# Patient Record
Sex: Male | Born: 1935 | Race: White | Hispanic: No | Marital: Married | Smoking: Never smoker
Health system: Southern US, Community
[De-identification: ages and names within clinical notes are randomized; demographics above are authoritative.]

## PROBLEM LIST (undated history)

## (undated) DIAGNOSIS — D539 Nutritional anemia, unspecified: Secondary | ICD-10-CM

## (undated) DIAGNOSIS — R296 Repeated falls: Secondary | ICD-10-CM

## (undated) DIAGNOSIS — S12600A Unspecified displaced fracture of seventh cervical vertebra, initial encounter for closed fracture: Secondary | ICD-10-CM

## (undated) DIAGNOSIS — N183 Chronic kidney disease, stage 3 unspecified: Secondary | ICD-10-CM

## (undated) DIAGNOSIS — F32A Depression, unspecified: Secondary | ICD-10-CM

## (undated) DIAGNOSIS — Z87448 Personal history of other diseases of urinary system: Secondary | ICD-10-CM

## (undated) DIAGNOSIS — Z5189 Encounter for other specified aftercare: Secondary | ICD-10-CM

## (undated) DIAGNOSIS — M1990 Unspecified osteoarthritis, unspecified site: Secondary | ICD-10-CM

## (undated) DIAGNOSIS — R42 Dizziness and giddiness: Secondary | ICD-10-CM

## (undated) DIAGNOSIS — N453 Epididymo-orchitis: Secondary | ICD-10-CM

## (undated) DIAGNOSIS — F329 Major depressive disorder, single episode, unspecified: Secondary | ICD-10-CM

## (undated) DIAGNOSIS — R7303 Prediabetes: Secondary | ICD-10-CM

## (undated) DIAGNOSIS — G4730 Sleep apnea, unspecified: Secondary | ICD-10-CM

## (undated) DIAGNOSIS — M4810 Ankylosing hyperostosis [Forestier], site unspecified: Secondary | ICD-10-CM

## (undated) DIAGNOSIS — Z86711 Personal history of pulmonary embolism: Secondary | ICD-10-CM

## (undated) DIAGNOSIS — I2510 Atherosclerotic heart disease of native coronary artery without angina pectoris: Secondary | ICD-10-CM

## (undated) DIAGNOSIS — R1310 Dysphagia, unspecified: Secondary | ICD-10-CM

## (undated) DIAGNOSIS — Z8781 Personal history of (healed) traumatic fracture: Secondary | ICD-10-CM

## (undated) DIAGNOSIS — F0390 Unspecified dementia without behavioral disturbance: Secondary | ICD-10-CM

## (undated) DIAGNOSIS — IMO0002 Reserved for concepts with insufficient information to code with codable children: Secondary | ICD-10-CM

## (undated) DIAGNOSIS — K222 Esophageal obstruction: Secondary | ICD-10-CM

## (undated) DIAGNOSIS — K219 Gastro-esophageal reflux disease without esophagitis: Secondary | ICD-10-CM

## (undated) DIAGNOSIS — W19XXXA Unspecified fall, initial encounter: Secondary | ICD-10-CM

## (undated) DIAGNOSIS — I639 Cerebral infarction, unspecified: Secondary | ICD-10-CM

## (undated) DIAGNOSIS — B3741 Candidal cystitis and urethritis: Secondary | ICD-10-CM

## (undated) DIAGNOSIS — H9190 Unspecified hearing loss, unspecified ear: Secondary | ICD-10-CM

## (undated) DIAGNOSIS — J189 Pneumonia, unspecified organism: Secondary | ICD-10-CM

## (undated) DIAGNOSIS — B009 Herpesviral infection, unspecified: Secondary | ICD-10-CM

## (undated) DIAGNOSIS — N3941 Urge incontinence: Secondary | ICD-10-CM

## (undated) DIAGNOSIS — Z8672 Personal history of thrombophlebitis: Secondary | ICD-10-CM

## (undated) DIAGNOSIS — I351 Nonrheumatic aortic (valve) insufficiency: Secondary | ICD-10-CM

## (undated) DIAGNOSIS — G250 Essential tremor: Secondary | ICD-10-CM

## (undated) DIAGNOSIS — R972 Elevated prostate specific antigen [PSA]: Secondary | ICD-10-CM

## (undated) DIAGNOSIS — E785 Hyperlipidemia, unspecified: Secondary | ICD-10-CM

## (undated) DIAGNOSIS — R269 Unspecified abnormalities of gait and mobility: Secondary | ICD-10-CM

## (undated) DIAGNOSIS — I82409 Acute embolism and thrombosis of unspecified deep veins of unspecified lower extremity: Secondary | ICD-10-CM

## (undated) DIAGNOSIS — M810 Age-related osteoporosis without current pathological fracture: Principal | ICD-10-CM

## (undated) DIAGNOSIS — K4090 Unilateral inguinal hernia, without obstruction or gangrene, not specified as recurrent: Secondary | ICD-10-CM

## (undated) HISTORY — DX: Prediabetes: R73.03

## (undated) HISTORY — DX: Essential tremor: G25.0

## (undated) HISTORY — DX: Hyperlipidemia, unspecified: E78.5

## (undated) HISTORY — DX: Major depressive disorder, single episode, unspecified: F32.9

## (undated) HISTORY — DX: Epididymo-orchitis: N45.3

## (undated) HISTORY — DX: Reserved for concepts with insufficient information to code with codable children: IMO0002

## (undated) HISTORY — DX: Unspecified osteoarthritis, unspecified site: M19.90

## (undated) HISTORY — DX: Ankylosing hyperostosis (forestier), site unspecified: M48.10

## (undated) HISTORY — DX: Personal history of (healed) traumatic fracture: Z87.81

## (undated) HISTORY — DX: Cerebral infarction, unspecified: I63.9

## (undated) HISTORY — DX: Unilateral inguinal hernia, without obstruction or gangrene, not specified as recurrent: K40.90

## (undated) HISTORY — DX: Depression, unspecified: F32.A

## (undated) HISTORY — DX: Atherosclerotic heart disease of native coronary artery without angina pectoris: I25.10

## (undated) HISTORY — DX: Candidal cystitis and urethritis: B37.41

## (undated) HISTORY — DX: Personal history of thrombophlebitis: Z86.72

## (undated) HISTORY — DX: Encounter for other specified aftercare: Z51.89

## (undated) HISTORY — DX: Chronic kidney disease, stage 3 (moderate): N18.3

## (undated) HISTORY — DX: Dizziness and giddiness: R42

## (undated) HISTORY — DX: Nutritional anemia, unspecified: D53.9

## (undated) HISTORY — DX: Unspecified abnormalities of gait and mobility: R26.9

## (undated) HISTORY — DX: Personal history of other diseases of urinary system: Z87.448

## (undated) HISTORY — DX: Personal history of pulmonary embolism: Z86.711

## (undated) HISTORY — DX: Sleep apnea, unspecified: G47.30

## (undated) HISTORY — DX: Elevated prostate specific antigen (PSA): R97.20

## (undated) HISTORY — DX: Unspecified hearing loss, unspecified ear: H91.90

## (undated) HISTORY — DX: Herpesviral infection, unspecified: B00.9

## (undated) HISTORY — DX: Age-related osteoporosis without current pathological fracture: M81.0

## (undated) HISTORY — PX: HIP PINNING: SHX1757

## (undated) HISTORY — DX: Unspecified displaced fracture of seventh cervical vertebra, initial encounter for closed fracture: S12.600A

## (undated) HISTORY — DX: Urge incontinence: N39.41

## (undated) HISTORY — DX: Chronic kidney disease, stage 3 unspecified: N18.30

## (undated) HISTORY — DX: Acute embolism and thrombosis of unspecified deep veins of unspecified lower extremity: I82.409

## (undated) HISTORY — DX: Gastro-esophageal reflux disease without esophagitis: K21.9

## (undated) HISTORY — DX: Esophageal obstruction: K22.2

---

## 1963-09-09 HISTORY — PX: TONSILLECTOMY: SUR1361

## 1977-09-08 HISTORY — PX: HERNIA REPAIR: SHX51

## 1989-05-09 DIAGNOSIS — IMO0001 Reserved for inherently not codable concepts without codable children: Secondary | ICD-10-CM

## 1989-05-09 DIAGNOSIS — Z5189 Encounter for other specified aftercare: Secondary | ICD-10-CM

## 1989-05-09 HISTORY — DX: Encounter for other specified aftercare: Z51.89

## 1989-05-09 HISTORY — DX: Reserved for inherently not codable concepts without codable children: IMO0001

## 1990-09-08 DIAGNOSIS — Z86711 Personal history of pulmonary embolism: Secondary | ICD-10-CM

## 1990-09-08 HISTORY — PX: CYSTOSCOPY: SUR368

## 1991-08-09 DIAGNOSIS — Z86711 Personal history of pulmonary embolism: Secondary | ICD-10-CM

## 1991-08-09 HISTORY — DX: Personal history of pulmonary embolism: Z86.711

## 1994-09-08 HISTORY — PX: EXPLORATORY LAPAROTOMY: SUR591

## 1994-09-08 HISTORY — PX: APPENDECTOMY: SHX54

## 1996-09-08 HISTORY — PX: COLONOSCOPY: SHX174

## 1998-09-08 HISTORY — PX: CARDIAC CATHETERIZATION: SHX172

## 1999-03-28 ENCOUNTER — Encounter (HOSPITAL_COMMUNITY): Admission: RE | Admit: 1999-03-28 | Discharge: 1999-04-08 | Payer: Self-pay | Admitting: Internal Medicine

## 1999-04-09 ENCOUNTER — Encounter (HOSPITAL_COMMUNITY): Admission: RE | Admit: 1999-04-09 | Discharge: 1999-07-08 | Payer: Self-pay | Admitting: Internal Medicine

## 1999-06-09 HISTORY — PX: OTHER SURGICAL HISTORY: SHX169

## 1999-06-20 ENCOUNTER — Encounter: Payer: Self-pay | Admitting: Emergency Medicine

## 1999-06-20 ENCOUNTER — Emergency Department (HOSPITAL_COMMUNITY): Admission: EM | Admit: 1999-06-20 | Discharge: 1999-06-20 | Payer: Self-pay | Admitting: Emergency Medicine

## 1999-07-01 ENCOUNTER — Ambulatory Visit (HOSPITAL_COMMUNITY): Admission: RE | Admit: 1999-07-01 | Discharge: 1999-07-01 | Payer: Self-pay | Admitting: Cardiology

## 1999-07-02 ENCOUNTER — Ambulatory Visit (HOSPITAL_COMMUNITY): Admission: RE | Admit: 1999-07-02 | Discharge: 1999-07-02 | Payer: Self-pay | Admitting: Cardiology

## 1999-07-09 ENCOUNTER — Encounter (HOSPITAL_COMMUNITY): Admission: RE | Admit: 1999-07-09 | Discharge: 1999-10-07 | Payer: Self-pay | Admitting: Internal Medicine

## 1999-08-19 ENCOUNTER — Ambulatory Visit (HOSPITAL_COMMUNITY): Admission: RE | Admit: 1999-08-19 | Discharge: 1999-08-19 | Payer: Self-pay | Admitting: *Deleted

## 1999-10-08 ENCOUNTER — Encounter (HOSPITAL_COMMUNITY): Admission: RE | Admit: 1999-10-08 | Discharge: 1999-11-21 | Payer: Self-pay | Admitting: Internal Medicine

## 2000-05-13 ENCOUNTER — Encounter (INDEPENDENT_AMBULATORY_CARE_PROVIDER_SITE_OTHER): Payer: Self-pay | Admitting: *Deleted

## 2000-05-13 ENCOUNTER — Ambulatory Visit (HOSPITAL_COMMUNITY): Admission: RE | Admit: 2000-05-13 | Discharge: 2000-05-13 | Payer: Self-pay | Admitting: *Deleted

## 2000-06-02 ENCOUNTER — Encounter (INDEPENDENT_AMBULATORY_CARE_PROVIDER_SITE_OTHER): Payer: Self-pay | Admitting: *Deleted

## 2000-06-22 ENCOUNTER — Encounter (INDEPENDENT_AMBULATORY_CARE_PROVIDER_SITE_OTHER): Payer: Self-pay | Admitting: *Deleted

## 2000-11-06 HISTORY — PX: CT ABD W & PELVIS WO CM: HXRAD294

## 2001-07-09 HISTORY — PX: CT ABD W & PELVIS WO CM: HXRAD294

## 2001-08-08 ENCOUNTER — Encounter: Payer: Self-pay | Admitting: Family Medicine

## 2001-08-08 LAB — CONVERTED CEMR LAB: PSA: 0.9 ng/mL

## 2002-03-24 HISTORY — PX: KNEE ARTHROSCOPY: SUR90

## 2002-07-09 ENCOUNTER — Encounter: Payer: Self-pay | Admitting: Family Medicine

## 2002-07-09 LAB — CONVERTED CEMR LAB: PSA: 1.1 ng/mL

## 2002-08-03 HISTORY — PX: ORIF FEMORAL NECK FRACTURE W/ DHS: SUR930

## 2003-12-08 ENCOUNTER — Encounter: Payer: Self-pay | Admitting: Family Medicine

## 2003-12-08 LAB — CONVERTED CEMR LAB: PSA: 1.1 ng/mL

## 2004-08-02 ENCOUNTER — Ambulatory Visit: Payer: Self-pay | Admitting: Family Medicine

## 2004-08-08 ENCOUNTER — Ambulatory Visit: Payer: Self-pay | Admitting: Family Medicine

## 2004-08-12 ENCOUNTER — Ambulatory Visit: Payer: Self-pay | Admitting: Oncology

## 2004-08-16 ENCOUNTER — Ambulatory Visit: Payer: Self-pay | Admitting: Family Medicine

## 2004-09-05 ENCOUNTER — Ambulatory Visit: Payer: Self-pay | Admitting: Family Medicine

## 2004-09-12 ENCOUNTER — Ambulatory Visit: Payer: Self-pay | Admitting: Family Medicine

## 2004-09-13 ENCOUNTER — Ambulatory Visit: Payer: Self-pay | Admitting: Family Medicine

## 2004-10-11 ENCOUNTER — Ambulatory Visit: Payer: Self-pay | Admitting: Family Medicine

## 2004-11-08 ENCOUNTER — Ambulatory Visit: Payer: Self-pay | Admitting: Family Medicine

## 2004-11-22 ENCOUNTER — Ambulatory Visit: Payer: Self-pay | Admitting: Internal Medicine

## 2004-11-22 ENCOUNTER — Ambulatory Visit: Payer: Self-pay | Admitting: Family Medicine

## 2004-12-02 ENCOUNTER — Ambulatory Visit: Payer: Self-pay | Admitting: Internal Medicine

## 2004-12-16 ENCOUNTER — Ambulatory Visit: Payer: Self-pay | Admitting: Family Medicine

## 2005-01-06 ENCOUNTER — Ambulatory Visit: Payer: Self-pay | Admitting: Family Medicine

## 2005-01-20 ENCOUNTER — Ambulatory Visit: Payer: Self-pay | Admitting: Family Medicine

## 2005-01-27 ENCOUNTER — Ambulatory Visit: Payer: Self-pay | Admitting: Family Medicine

## 2005-02-24 ENCOUNTER — Ambulatory Visit: Payer: Self-pay | Admitting: Family Medicine

## 2005-03-21 ENCOUNTER — Ambulatory Visit: Payer: Self-pay | Admitting: Family Medicine

## 2005-03-24 ENCOUNTER — Ambulatory Visit: Payer: Self-pay | Admitting: Family Medicine

## 2005-04-21 ENCOUNTER — Ambulatory Visit: Payer: Self-pay | Admitting: Family Medicine

## 2005-05-05 ENCOUNTER — Ambulatory Visit: Payer: Self-pay | Admitting: Family Medicine

## 2005-05-09 ENCOUNTER — Encounter: Payer: Self-pay | Admitting: Family Medicine

## 2005-05-19 ENCOUNTER — Ambulatory Visit: Payer: Self-pay | Admitting: Family Medicine

## 2005-05-22 ENCOUNTER — Ambulatory Visit: Payer: Self-pay | Admitting: Family Medicine

## 2005-05-26 ENCOUNTER — Ambulatory Visit: Payer: Self-pay | Admitting: Family Medicine

## 2005-05-28 ENCOUNTER — Ambulatory Visit: Payer: Self-pay | Admitting: Family Medicine

## 2005-06-16 ENCOUNTER — Ambulatory Visit: Payer: Self-pay | Admitting: Family Medicine

## 2005-06-30 ENCOUNTER — Ambulatory Visit: Payer: Self-pay | Admitting: Family Medicine

## 2005-07-10 ENCOUNTER — Ambulatory Visit: Payer: Self-pay | Admitting: Family Medicine

## 2005-07-28 ENCOUNTER — Ambulatory Visit: Payer: Self-pay | Admitting: Family Medicine

## 2005-08-22 ENCOUNTER — Ambulatory Visit: Payer: Self-pay | Admitting: Family Medicine

## 2005-08-28 ENCOUNTER — Ambulatory Visit: Payer: Self-pay | Admitting: Family Medicine

## 2005-09-11 ENCOUNTER — Ambulatory Visit: Payer: Self-pay | Admitting: Family Medicine

## 2005-09-25 ENCOUNTER — Ambulatory Visit: Payer: Self-pay | Admitting: Family Medicine

## 2005-10-10 ENCOUNTER — Ambulatory Visit: Payer: Self-pay | Admitting: Family Medicine

## 2005-10-23 ENCOUNTER — Ambulatory Visit: Payer: Self-pay | Admitting: Family Medicine

## 2005-11-20 ENCOUNTER — Ambulatory Visit: Payer: Self-pay | Admitting: Family Medicine

## 2005-12-04 ENCOUNTER — Ambulatory Visit: Payer: Self-pay | Admitting: Family Medicine

## 2005-12-31 ENCOUNTER — Ambulatory Visit: Payer: Self-pay | Admitting: Family Medicine

## 2006-01-28 ENCOUNTER — Ambulatory Visit: Payer: Self-pay | Admitting: Family Medicine

## 2006-02-26 ENCOUNTER — Ambulatory Visit: Payer: Self-pay | Admitting: Family Medicine

## 2006-03-05 ENCOUNTER — Ambulatory Visit: Payer: Self-pay | Admitting: Family Medicine

## 2006-03-26 ENCOUNTER — Ambulatory Visit: Payer: Self-pay | Admitting: Family Medicine

## 2006-04-03 ENCOUNTER — Ambulatory Visit: Payer: Self-pay | Admitting: Family Medicine

## 2006-04-10 ENCOUNTER — Ambulatory Visit: Payer: Self-pay | Admitting: Family Medicine

## 2006-04-23 ENCOUNTER — Ambulatory Visit: Payer: Self-pay | Admitting: Family Medicine

## 2006-05-21 ENCOUNTER — Ambulatory Visit: Payer: Self-pay | Admitting: Family Medicine

## 2006-05-25 ENCOUNTER — Ambulatory Visit: Payer: Self-pay | Admitting: Family Medicine

## 2006-05-29 ENCOUNTER — Ambulatory Visit: Payer: Self-pay | Admitting: Family Medicine

## 2006-06-01 ENCOUNTER — Ambulatory Visit: Payer: Self-pay | Admitting: Family Medicine

## 2006-06-04 ENCOUNTER — Ambulatory Visit: Payer: Self-pay | Admitting: Family Medicine

## 2006-06-18 ENCOUNTER — Ambulatory Visit: Payer: Self-pay | Admitting: Family Medicine

## 2006-07-02 ENCOUNTER — Ambulatory Visit: Payer: Self-pay | Admitting: Family Medicine

## 2006-07-29 ENCOUNTER — Ambulatory Visit: Payer: Self-pay | Admitting: Family Medicine

## 2006-08-26 ENCOUNTER — Ambulatory Visit: Payer: Self-pay | Admitting: Family Medicine

## 2006-08-27 ENCOUNTER — Encounter: Payer: Self-pay | Admitting: Family Medicine

## 2006-08-27 LAB — CONVERTED CEMR LAB: PSA: 1.11 ng/mL

## 2006-09-09 ENCOUNTER — Ambulatory Visit: Payer: Self-pay | Admitting: Family Medicine

## 2006-09-11 ENCOUNTER — Ambulatory Visit: Payer: Self-pay | Admitting: Family Medicine

## 2006-10-12 ENCOUNTER — Ambulatory Visit: Payer: Self-pay | Admitting: Family Medicine

## 2006-10-26 ENCOUNTER — Ambulatory Visit: Payer: Self-pay | Admitting: Family Medicine

## 2006-11-09 ENCOUNTER — Ambulatory Visit: Payer: Self-pay | Admitting: Family Medicine

## 2006-12-07 ENCOUNTER — Ambulatory Visit: Payer: Self-pay | Admitting: Family Medicine

## 2007-01-04 ENCOUNTER — Ambulatory Visit: Payer: Self-pay | Admitting: Family Medicine

## 2007-01-04 DIAGNOSIS — I809 Phlebitis and thrombophlebitis of unspecified site: Secondary | ICD-10-CM

## 2007-02-02 ENCOUNTER — Ambulatory Visit: Payer: Self-pay | Admitting: Family Medicine

## 2007-02-02 ENCOUNTER — Telehealth (INDEPENDENT_AMBULATORY_CARE_PROVIDER_SITE_OTHER): Payer: Self-pay | Admitting: *Deleted

## 2007-02-02 LAB — CONVERTED CEMR LAB: INR: 2.9

## 2007-03-02 ENCOUNTER — Ambulatory Visit: Payer: Self-pay | Admitting: Family Medicine

## 2007-03-08 ENCOUNTER — Encounter: Payer: Self-pay | Admitting: Family Medicine

## 2007-03-08 DIAGNOSIS — D696 Thrombocytopenia, unspecified: Secondary | ICD-10-CM

## 2007-03-08 DIAGNOSIS — N183 Chronic kidney disease, stage 3 (moderate): Secondary | ICD-10-CM

## 2007-03-08 DIAGNOSIS — I2510 Atherosclerotic heart disease of native coronary artery without angina pectoris: Secondary | ICD-10-CM

## 2007-03-08 DIAGNOSIS — N3941 Urge incontinence: Secondary | ICD-10-CM | POA: Insufficient documentation

## 2007-03-08 DIAGNOSIS — B009 Herpesviral infection, unspecified: Secondary | ICD-10-CM | POA: Insufficient documentation

## 2007-03-08 DIAGNOSIS — Z87442 Personal history of urinary calculi: Secondary | ICD-10-CM

## 2007-03-08 HISTORY — DX: Herpesviral infection, unspecified: B00.9

## 2007-03-15 ENCOUNTER — Ambulatory Visit: Payer: Self-pay | Admitting: Family Medicine

## 2007-03-30 ENCOUNTER — Ambulatory Visit: Payer: Self-pay | Admitting: Family Medicine

## 2007-03-30 DIAGNOSIS — L255 Unspecified contact dermatitis due to plants, except food: Secondary | ICD-10-CM

## 2007-03-30 LAB — CONVERTED CEMR LAB
INR: 2
Prothrombin Time: 17.3 s

## 2007-04-26 ENCOUNTER — Ambulatory Visit: Payer: Self-pay | Admitting: Family Medicine

## 2007-04-26 ENCOUNTER — Encounter: Payer: Self-pay | Admitting: Family Medicine

## 2007-04-26 DIAGNOSIS — I10 Essential (primary) hypertension: Secondary | ICD-10-CM

## 2007-04-26 LAB — CONVERTED CEMR LAB
Basophils Relative: 0.8 % (ref 0.0–1.0)
Chloride: 112 meq/L (ref 96–112)
Creatinine, Ser: 1.6 mg/dL — ABNORMAL HIGH (ref 0.4–1.5)
Glucose, Bld: 109 mg/dL — ABNORMAL HIGH (ref 70–99)
Lymphocytes Relative: 28.4 % (ref 12.0–46.0)
MCHC: 35.4 g/dL (ref 30.0–36.0)
MCV: 99.6 fL (ref 78.0–100.0)
Monocytes Relative: 8.7 % (ref 3.0–11.0)
Potassium: 4.9 meq/L (ref 3.5–5.1)
RBC: 3.79 M/uL — ABNORMAL LOW (ref 4.22–5.81)
RDW: 12.8 % (ref 11.5–14.6)
Sodium: 144 meq/L (ref 135–145)

## 2007-04-28 ENCOUNTER — Ambulatory Visit: Payer: Self-pay | Admitting: Family Medicine

## 2007-04-28 DIAGNOSIS — R7303 Prediabetes: Secondary | ICD-10-CM

## 2007-05-12 ENCOUNTER — Ambulatory Visit: Payer: Self-pay | Admitting: Family Medicine

## 2007-05-12 LAB — CONVERTED CEMR LAB: INR: 2.9

## 2007-06-11 ENCOUNTER — Ambulatory Visit: Payer: Self-pay | Admitting: Family Medicine

## 2007-07-09 ENCOUNTER — Ambulatory Visit: Payer: Self-pay | Admitting: Family Medicine

## 2007-07-09 LAB — CONVERTED CEMR LAB: INR: 2.7

## 2007-07-16 ENCOUNTER — Ambulatory Visit: Payer: Self-pay | Admitting: Family Medicine

## 2007-08-09 ENCOUNTER — Ambulatory Visit: Payer: Self-pay | Admitting: Family Medicine

## 2007-08-09 LAB — CONVERTED CEMR LAB: Prothrombin Time: 20.5 s

## 2007-09-06 ENCOUNTER — Ambulatory Visit: Payer: Self-pay | Admitting: Family Medicine

## 2007-09-06 LAB — CONVERTED CEMR LAB
INR: 1.6
Prothrombin Time: 15.7 s

## 2007-09-14 ENCOUNTER — Ambulatory Visit: Payer: Self-pay | Admitting: Family Medicine

## 2007-09-14 LAB — CONVERTED CEMR LAB
AST: 26 units/L (ref 0–37)
BUN: 26 mg/dL — ABNORMAL HIGH (ref 6–23)
Bilirubin, Direct: 0.1 mg/dL (ref 0.0–0.3)
Chloride: 109 meq/L (ref 96–112)
Creatinine, Ser: 1.6 mg/dL — ABNORMAL HIGH (ref 0.4–1.5)
GFR calc Af Amer: 55 mL/min
Glucose, Bld: 116 mg/dL — ABNORMAL HIGH (ref 70–99)
HCT: 38.2 % — ABNORMAL LOW (ref 39.0–52.0)
Lymphocytes Relative: 33.2 % (ref 12.0–46.0)
MCHC: 35.5 g/dL (ref 30.0–36.0)
Monocytes Absolute: 0.5 10*3/uL (ref 0.2–0.7)
Monocytes Relative: 10.2 % (ref 3.0–11.0)
Neutro Abs: 2.7 10*3/uL (ref 1.4–7.7)
PSA: 1.29 ng/mL (ref 0.10–4.00)
Potassium: 4.6 meq/L (ref 3.5–5.1)
RBC: 3.76 M/uL — ABNORMAL LOW (ref 4.22–5.81)
TSH: 1.66 microintl units/mL (ref 0.35–5.50)
Total Bilirubin: 0.9 mg/dL (ref 0.3–1.2)
Total CHOL/HDL Ratio: 6.6
Total Protein: 6.3 g/dL (ref 6.0–8.3)
WBC: 5 10*3/uL (ref 4.5–10.5)

## 2007-09-16 ENCOUNTER — Ambulatory Visit: Payer: Self-pay | Admitting: Family Medicine

## 2007-09-22 ENCOUNTER — Ambulatory Visit: Payer: Self-pay | Admitting: Family Medicine

## 2007-09-22 LAB — CONVERTED CEMR LAB
INR: 2.5
Prothrombin Time: 19.6 s

## 2007-09-23 ENCOUNTER — Ambulatory Visit: Payer: Self-pay | Admitting: Family Medicine

## 2007-09-27 ENCOUNTER — Ambulatory Visit: Payer: Self-pay | Admitting: Family Medicine

## 2007-10-06 ENCOUNTER — Ambulatory Visit: Payer: Self-pay | Admitting: Family Medicine

## 2007-10-20 ENCOUNTER — Ambulatory Visit: Payer: Self-pay | Admitting: Family Medicine

## 2007-10-25 ENCOUNTER — Ambulatory Visit: Payer: Self-pay | Admitting: Family Medicine

## 2007-11-17 ENCOUNTER — Ambulatory Visit: Payer: Self-pay | Admitting: Family Medicine

## 2007-11-17 LAB — CONVERTED CEMR LAB

## 2007-11-19 ENCOUNTER — Ambulatory Visit: Payer: Self-pay | Admitting: Internal Medicine

## 2007-12-15 ENCOUNTER — Ambulatory Visit: Payer: Self-pay | Admitting: Family Medicine

## 2007-12-15 LAB — CONVERTED CEMR LAB
INR: 2.5
Prothrombin Time: 19.2 s

## 2007-12-22 ENCOUNTER — Ambulatory Visit: Payer: Self-pay | Admitting: Family Medicine

## 2007-12-28 ENCOUNTER — Ambulatory Visit: Payer: Self-pay

## 2007-12-28 ENCOUNTER — Encounter: Payer: Self-pay | Admitting: Family Medicine

## 2007-12-28 HISTORY — PX: US ECHOCARDIOGRAPHY: HXRAD669

## 2007-12-28 HISTORY — PX: OTHER SURGICAL HISTORY: SHX169

## 2007-12-31 ENCOUNTER — Telehealth: Payer: Self-pay | Admitting: Family Medicine

## 2008-01-19 ENCOUNTER — Ambulatory Visit: Payer: Self-pay | Admitting: Family Medicine

## 2008-01-19 LAB — CONVERTED CEMR LAB: INR: 2.5

## 2008-02-03 ENCOUNTER — Ambulatory Visit: Payer: Self-pay | Admitting: Family Medicine

## 2008-02-24 ENCOUNTER — Ambulatory Visit: Payer: Self-pay | Admitting: Family Medicine

## 2008-03-08 ENCOUNTER — Ambulatory Visit: Payer: Self-pay | Admitting: Family Medicine

## 2008-03-08 LAB — CONVERTED CEMR LAB: Prothrombin Time: 18.1 s

## 2008-03-13 ENCOUNTER — Encounter (INDEPENDENT_AMBULATORY_CARE_PROVIDER_SITE_OTHER): Payer: Self-pay | Admitting: *Deleted

## 2008-03-22 ENCOUNTER — Ambulatory Visit: Payer: Self-pay | Admitting: Family Medicine

## 2008-03-22 LAB — CONVERTED CEMR LAB
BUN: 33 mg/dL — ABNORMAL HIGH (ref 6–23)
CO2: 30 meq/L (ref 19–32)
Calcium: 9.6 mg/dL (ref 8.4–10.5)
GFR calc Af Amer: 51 mL/min
GFR calc non Af Amer: 42 mL/min
Glucose, Bld: 106 mg/dL — ABNORMAL HIGH (ref 70–99)
Potassium: 4.5 meq/L (ref 3.5–5.1)
Sodium: 141 meq/L (ref 135–145)

## 2008-03-27 ENCOUNTER — Ambulatory Visit: Payer: Self-pay | Admitting: Family Medicine

## 2008-04-04 ENCOUNTER — Ambulatory Visit: Payer: Self-pay | Admitting: Family Medicine

## 2008-04-04 LAB — CONVERTED CEMR LAB: Prothrombin Time: 17.5 s

## 2008-05-02 ENCOUNTER — Ambulatory Visit: Payer: Self-pay | Admitting: Family Medicine

## 2008-05-02 LAB — CONVERTED CEMR LAB: Prothrombin Time: 16.9 s

## 2008-05-16 ENCOUNTER — Ambulatory Visit: Payer: Self-pay | Admitting: Family Medicine

## 2008-05-24 ENCOUNTER — Telehealth: Payer: Self-pay | Admitting: Family Medicine

## 2008-06-13 ENCOUNTER — Ambulatory Visit: Payer: Self-pay | Admitting: Family Medicine

## 2008-06-13 LAB — CONVERTED CEMR LAB: INR: 2.1

## 2008-07-11 ENCOUNTER — Ambulatory Visit: Payer: Self-pay | Admitting: Family Medicine

## 2008-07-11 LAB — CONVERTED CEMR LAB: Prothrombin Time: 19.5 s

## 2008-08-08 ENCOUNTER — Ambulatory Visit: Payer: Self-pay | Admitting: Family Medicine

## 2008-08-08 LAB — CONVERTED CEMR LAB: Prothrombin Time: 18.8 s

## 2008-09-05 ENCOUNTER — Ambulatory Visit: Payer: Self-pay | Admitting: Family Medicine

## 2008-09-05 LAB — CONVERTED CEMR LAB
INR: 2.3
Prothrombin Time: 18.5 s

## 2008-09-22 ENCOUNTER — Ambulatory Visit: Payer: Self-pay | Admitting: Family Medicine

## 2008-09-22 DIAGNOSIS — R5383 Other fatigue: Secondary | ICD-10-CM

## 2008-09-22 DIAGNOSIS — R5381 Other malaise: Secondary | ICD-10-CM

## 2008-09-22 LAB — CONVERTED CEMR LAB
Albumin: 4.1 g/dL (ref 3.5–5.2)
Alkaline Phosphatase: 34 units/L — ABNORMAL LOW (ref 39–117)
Basophils Absolute: 0 10*3/uL (ref 0.0–0.1)
Basophils Relative: 0.2 % (ref 0.0–3.0)
Bilirubin, Direct: 0.2 mg/dL (ref 0.0–0.3)
CO2: 31 meq/L (ref 19–32)
Calcium: 9.6 mg/dL (ref 8.4–10.5)
Creatinine, Ser: 1.9 mg/dL — ABNORMAL HIGH (ref 0.4–1.5)
Creatinine,U: 137.5 mg/dL
Eosinophils Absolute: 0.2 10*3/uL (ref 0.0–0.7)
GFR calc non Af Amer: 37 mL/min
HCT: 40.5 % (ref 39.0–52.0)
Hemoglobin: 14.3 g/dL (ref 13.0–17.0)
Lymphocytes Relative: 27.5 % (ref 12.0–46.0)
MCV: 101.7 fL — ABNORMAL HIGH (ref 78.0–100.0)
Monocytes Relative: 8.1 % (ref 3.0–12.0)
Neutro Abs: 3 10*3/uL (ref 1.4–7.7)
PSA: 1.37 ng/mL (ref 0.10–4.00)
Platelets: 139 10*3/uL — ABNORMAL LOW (ref 150–400)
RDW: 13.1 % (ref 11.5–14.6)
Sodium: 143 meq/L (ref 135–145)
TSH: 1.94 microintl units/mL (ref 0.35–5.50)
Total Bilirubin: 1.3 mg/dL — ABNORMAL HIGH (ref 0.3–1.2)
Total Protein: 6.9 g/dL (ref 6.0–8.3)
Triglycerides: 193 mg/dL — ABNORMAL HIGH (ref 0–149)
WBC: 5 10*3/uL (ref 4.5–10.5)

## 2008-09-27 ENCOUNTER — Telehealth (INDEPENDENT_AMBULATORY_CARE_PROVIDER_SITE_OTHER): Payer: Self-pay | Admitting: *Deleted

## 2008-09-27 ENCOUNTER — Ambulatory Visit: Payer: Self-pay | Admitting: Family Medicine

## 2008-09-27 DIAGNOSIS — M25559 Pain in unspecified hip: Secondary | ICD-10-CM

## 2008-09-27 DIAGNOSIS — G250 Essential tremor: Secondary | ICD-10-CM

## 2008-09-27 HISTORY — DX: Essential tremor: G25.0

## 2008-10-03 ENCOUNTER — Ambulatory Visit: Payer: Self-pay | Admitting: Family Medicine

## 2008-10-03 LAB — CONVERTED CEMR LAB
INR: 3.4
Prothrombin Time: 22.4 s

## 2008-10-27 ENCOUNTER — Ambulatory Visit: Payer: Self-pay | Admitting: Family Medicine

## 2008-10-27 LAB — CONVERTED CEMR LAB
INR: 2.5
Prothrombin Time: 19.4 s

## 2008-10-29 LAB — CONVERTED CEMR LAB
BUN: 41 mg/dL — ABNORMAL HIGH (ref 6–23)
Basophils Absolute: 0.1 10*3/uL (ref 0.0–0.1)
Basophils Relative: 1.2 % (ref 0.0–3.0)
Calcium: 9.8 mg/dL (ref 8.4–10.5)
Chloride: 107 meq/L (ref 96–112)
Creatinine, Ser: 1.8 mg/dL — ABNORMAL HIGH (ref 0.4–1.5)
Eosinophils Absolute: 0.1 10*3/uL (ref 0.0–0.7)
GFR calc Af Amer: 48 mL/min
HCT: 40.8 % (ref 39.0–52.0)
Iron: 106 ug/dL (ref 42–165)
Monocytes Absolute: 0.3 10*3/uL (ref 0.1–1.0)
Monocytes Relative: 5.4 % (ref 3.0–12.0)
Neutro Abs: 2.9 10*3/uL (ref 1.4–7.7)
Neutrophils Relative %: 63.6 % (ref 43.0–77.0)
Platelets: 135 10*3/uL — ABNORMAL LOW (ref 150–400)
Vitamin B-12: 247 pg/mL (ref 211–911)
WBC: 4.7 10*3/uL (ref 4.5–10.5)

## 2008-10-30 LAB — CONVERTED CEMR LAB: Vit D, 25-Hydroxy: 12 ng/mL — ABNORMAL LOW (ref 30–89)

## 2008-11-01 ENCOUNTER — Ambulatory Visit: Payer: Self-pay | Admitting: Family Medicine

## 2008-11-01 DIAGNOSIS — E559 Vitamin D deficiency, unspecified: Secondary | ICD-10-CM

## 2008-11-01 DIAGNOSIS — E538 Deficiency of other specified B group vitamins: Secondary | ICD-10-CM

## 2008-11-03 ENCOUNTER — Ambulatory Visit: Payer: Self-pay | Admitting: Family Medicine

## 2008-11-03 LAB — CONVERTED CEMR LAB: OCCULT 3: NEGATIVE

## 2008-11-06 ENCOUNTER — Encounter (INDEPENDENT_AMBULATORY_CARE_PROVIDER_SITE_OTHER): Payer: Self-pay | Admitting: *Deleted

## 2008-11-24 ENCOUNTER — Ambulatory Visit: Payer: Self-pay | Admitting: Family Medicine

## 2008-11-24 LAB — CONVERTED CEMR LAB

## 2008-12-01 ENCOUNTER — Ambulatory Visit: Payer: Self-pay | Admitting: Family Medicine

## 2008-12-01 LAB — CONVERTED CEMR LAB
INR: 3.3
Prothrombin Time: 22 s

## 2008-12-19 ENCOUNTER — Ambulatory Visit: Payer: Self-pay | Admitting: Family Medicine

## 2008-12-27 ENCOUNTER — Ambulatory Visit: Payer: Self-pay | Admitting: Family Medicine

## 2008-12-30 LAB — CONVERTED CEMR LAB: Vit D, 25-Hydroxy: 12 ng/mL — ABNORMAL LOW (ref 30–89)

## 2009-01-03 ENCOUNTER — Ambulatory Visit: Payer: Self-pay | Admitting: Family Medicine

## 2009-01-13 ENCOUNTER — Telehealth (INDEPENDENT_AMBULATORY_CARE_PROVIDER_SITE_OTHER): Payer: Self-pay | Admitting: *Deleted

## 2009-01-16 ENCOUNTER — Ambulatory Visit: Payer: Self-pay | Admitting: Family Medicine

## 2009-02-13 ENCOUNTER — Ambulatory Visit: Payer: Self-pay | Admitting: Family Medicine

## 2009-02-13 LAB — CONVERTED CEMR LAB: INR: 2.2

## 2009-03-14 ENCOUNTER — Ambulatory Visit: Payer: Self-pay | Admitting: Family Medicine

## 2009-03-14 LAB — CONVERTED CEMR LAB
INR: 4.4
Prothrombin Time: 25.3 s

## 2009-03-16 ENCOUNTER — Ambulatory Visit: Payer: Self-pay | Admitting: Family Medicine

## 2009-03-16 LAB — CONVERTED CEMR LAB
INR: 2.1
Prothrombin Time: 17.9 s

## 2009-03-23 ENCOUNTER — Ambulatory Visit: Payer: Self-pay | Admitting: Family Medicine

## 2009-03-29 ENCOUNTER — Ambulatory Visit: Payer: Self-pay | Admitting: Family Medicine

## 2009-03-30 LAB — CONVERTED CEMR LAB: Vit D, 25-Hydroxy: 29 ng/mL — ABNORMAL LOW (ref 30–89)

## 2009-04-05 ENCOUNTER — Ambulatory Visit: Payer: Self-pay | Admitting: Family Medicine

## 2009-04-19 ENCOUNTER — Ambulatory Visit: Payer: Self-pay | Admitting: Family Medicine

## 2009-04-19 LAB — CONVERTED CEMR LAB
INR: 3.3
Prothrombin Time: 22 s

## 2009-05-04 ENCOUNTER — Ambulatory Visit: Payer: Self-pay | Admitting: Family Medicine

## 2009-05-04 LAB — CONVERTED CEMR LAB
INR: 3.1
Prothrombin Time: 21.4 s

## 2009-05-31 ENCOUNTER — Ambulatory Visit: Payer: Self-pay | Admitting: Family Medicine

## 2009-05-31 LAB — CONVERTED CEMR LAB: Prothrombin Time: 18.4 s

## 2009-06-04 ENCOUNTER — Telehealth: Payer: Self-pay | Admitting: Family Medicine

## 2009-06-20 ENCOUNTER — Ambulatory Visit: Payer: Self-pay | Admitting: Family Medicine

## 2009-06-28 ENCOUNTER — Ambulatory Visit: Payer: Self-pay | Admitting: Family Medicine

## 2009-07-04 ENCOUNTER — Ambulatory Visit: Payer: Self-pay | Admitting: Family Medicine

## 2009-07-09 ENCOUNTER — Ambulatory Visit: Payer: Self-pay | Admitting: Family Medicine

## 2009-07-09 LAB — CONVERTED CEMR LAB: INR: 2.4

## 2009-08-06 ENCOUNTER — Ambulatory Visit: Payer: Self-pay | Admitting: Family Medicine

## 2009-08-06 LAB — CONVERTED CEMR LAB
INR: 2.7
Prothrombin Time: 19.8 s

## 2009-09-04 ENCOUNTER — Ambulatory Visit: Payer: Self-pay | Admitting: Family Medicine

## 2009-09-04 LAB — CONVERTED CEMR LAB
INR: 2.9
Prothrombin Time: 20.7 s

## 2009-09-08 HISTORY — PX: CATARACT EXTRACTION: SUR2

## 2009-09-25 ENCOUNTER — Ambulatory Visit: Payer: Medicare Other | Admitting: Ophthalmology

## 2009-10-01 ENCOUNTER — Ambulatory Visit: Payer: Self-pay | Admitting: Family Medicine

## 2009-10-04 ENCOUNTER — Ambulatory Visit: Payer: Self-pay | Admitting: Family Medicine

## 2009-10-29 ENCOUNTER — Ambulatory Visit: Payer: Self-pay | Admitting: Family Medicine

## 2009-11-02 ENCOUNTER — Encounter: Payer: Self-pay | Admitting: Family Medicine

## 2009-11-06 HISTORY — PX: MRI: SHX5353

## 2009-11-12 ENCOUNTER — Encounter: Payer: Self-pay | Admitting: Family Medicine

## 2009-11-12 HISTORY — PX: OTHER SURGICAL HISTORY: SHX169

## 2009-11-22 ENCOUNTER — Encounter: Admission: RE | Admit: 2009-11-22 | Discharge: 2009-11-22 | Payer: Self-pay | Admitting: Neurology

## 2009-11-26 ENCOUNTER — Ambulatory Visit: Payer: Self-pay | Admitting: Family Medicine

## 2009-11-26 LAB — CONVERTED CEMR LAB: INR: 1.8

## 2009-11-27 ENCOUNTER — Ambulatory Visit: Payer: Medicare Other | Admitting: Ophthalmology

## 2009-11-27 LAB — CONVERTED CEMR LAB
BUN: 25 mg/dL — ABNORMAL HIGH (ref 6–23)
Basophils Absolute: 0 10*3/uL (ref 0.0–0.1)
Bilirubin, Direct: 0.2 mg/dL (ref 0.0–0.3)
Chloride: 110 meq/L (ref 96–112)
Cholesterol: 141 mg/dL (ref 0–200)
Creatinine, Ser: 1.6 mg/dL — ABNORMAL HIGH (ref 0.4–1.5)
Creatinine,U: 128.2 mg/dL
Eosinophils Absolute: 0.1 10*3/uL (ref 0.0–0.7)
Eosinophils Relative: 1.6 % (ref 0.0–5.0)
Glucose, Bld: 102 mg/dL — ABNORMAL HIGH (ref 70–99)
Hgb A1c MFr Bld: 5.5 % (ref 4.6–6.5)
MCHC: 33.4 g/dL (ref 30.0–36.0)
MCV: 107.3 fL — ABNORMAL HIGH (ref 78.0–100.0)
Microalb, Ur: 0.9 mg/dL (ref 0.0–1.9)
Monocytes Absolute: 0.4 10*3/uL (ref 0.1–1.0)
Neutrophils Relative %: 63.2 % (ref 43.0–77.0)
PSA: 7.89 ng/mL — ABNORMAL HIGH (ref 0.10–4.00)
Platelets: 136 10*3/uL — ABNORMAL LOW (ref 150.0–400.0)
RDW: 13.2 % (ref 11.5–14.6)
TSH: 1.73 microintl units/mL (ref 0.35–5.50)
Total Bilirubin: 0.9 mg/dL (ref 0.3–1.2)
VLDL: 50.2 mg/dL — ABNORMAL HIGH (ref 0.0–40.0)
WBC: 5.1 10*3/uL (ref 4.5–10.5)

## 2009-12-03 ENCOUNTER — Ambulatory Visit: Payer: Self-pay | Admitting: Family Medicine

## 2009-12-03 DIAGNOSIS — R972 Elevated prostate specific antigen [PSA]: Secondary | ICD-10-CM | POA: Insufficient documentation

## 2009-12-10 ENCOUNTER — Ambulatory Visit: Payer: Self-pay | Admitting: Family Medicine

## 2009-12-21 ENCOUNTER — Encounter: Payer: Self-pay | Admitting: Family Medicine

## 2009-12-21 LAB — CONVERTED CEMR LAB: PSA: 2.09 ng/mL

## 2010-01-07 ENCOUNTER — Ambulatory Visit: Payer: Self-pay | Admitting: Family Medicine

## 2010-01-10 ENCOUNTER — Ambulatory Visit: Payer: Self-pay | Admitting: Family Medicine

## 2010-02-05 ENCOUNTER — Ambulatory Visit: Payer: Self-pay | Admitting: Family Medicine

## 2010-02-05 LAB — CONVERTED CEMR LAB: Prothrombin Time: 29.5 s

## 2010-03-05 ENCOUNTER — Ambulatory Visit: Payer: Self-pay | Admitting: Family Medicine

## 2010-03-05 LAB — CONVERTED CEMR LAB
INR: 2.4
Prothrombin Time: 28.8 s

## 2010-04-02 ENCOUNTER — Ambulatory Visit: Payer: Self-pay | Admitting: Family Medicine

## 2010-04-02 LAB — CONVERTED CEMR LAB: INR: 3.3

## 2010-04-04 ENCOUNTER — Telehealth: Payer: Self-pay | Admitting: Family Medicine

## 2010-04-04 ENCOUNTER — Encounter: Payer: Self-pay | Admitting: Family Medicine

## 2010-04-16 ENCOUNTER — Ambulatory Visit: Payer: Self-pay | Admitting: Internal Medicine

## 2010-04-16 DIAGNOSIS — R2681 Unsteadiness on feet: Secondary | ICD-10-CM | POA: Insufficient documentation

## 2010-04-23 ENCOUNTER — Encounter: Payer: Self-pay | Admitting: Family Medicine

## 2010-04-30 ENCOUNTER — Ambulatory Visit: Payer: Self-pay | Admitting: Family Medicine

## 2010-04-30 LAB — CONVERTED CEMR LAB: Prothrombin Time: 30.8 s

## 2010-05-20 ENCOUNTER — Ambulatory Visit: Payer: Self-pay | Admitting: Family Medicine

## 2010-05-24 ENCOUNTER — Encounter: Payer: Self-pay | Admitting: Family Medicine

## 2010-05-30 ENCOUNTER — Ambulatory Visit: Payer: Self-pay | Admitting: Internal Medicine

## 2010-06-05 ENCOUNTER — Telehealth: Payer: Self-pay | Admitting: Family Medicine

## 2010-06-06 ENCOUNTER — Ambulatory Visit: Payer: Self-pay | Admitting: Family Medicine

## 2010-06-06 ENCOUNTER — Ambulatory Visit: Payer: Self-pay | Admitting: Internal Medicine

## 2010-06-06 DIAGNOSIS — R197 Diarrhea, unspecified: Secondary | ICD-10-CM

## 2010-06-07 LAB — CONVERTED CEMR LAB
ALT: 36 units/L (ref 0–53)
Albumin: 4 g/dL (ref 3.5–5.2)
Basophils Relative: 0.7 % (ref 0.0–3.0)
Bilirubin, Direct: 0.2 mg/dL (ref 0.0–0.3)
CO2: 28 meq/L (ref 19–32)
Calcium: 9.3 mg/dL (ref 8.4–10.5)
Chloride: 104 meq/L (ref 96–112)
Creatinine, Ser: 1.7 mg/dL — ABNORMAL HIGH (ref 0.4–1.5)
Eosinophils Absolute: 0.1 10*3/uL (ref 0.0–0.7)
Eosinophils Relative: 1.6 % (ref 0.0–5.0)
Glucose, Bld: 104 mg/dL — ABNORMAL HIGH (ref 70–99)
HCT: 37.9 % — ABNORMAL LOW (ref 39.0–52.0)
Lymphs Abs: 1.9 10*3/uL (ref 0.7–4.0)
MCHC: 35.5 g/dL (ref 30.0–36.0)
MCV: 105 fL — ABNORMAL HIGH (ref 78.0–100.0)
Monocytes Absolute: 0.6 10*3/uL (ref 0.1–1.0)
Neutrophils Relative %: 59.5 % (ref 43.0–77.0)
RBC: 3.61 M/uL — ABNORMAL LOW (ref 4.22–5.81)
Sodium: 139 meq/L (ref 135–145)
Total Protein: 6.5 g/dL (ref 6.0–8.3)
WBC: 6.7 10*3/uL (ref 4.5–10.5)

## 2010-06-08 ENCOUNTER — Encounter: Payer: Self-pay | Admitting: Family Medicine

## 2010-06-11 ENCOUNTER — Ambulatory Visit: Payer: Self-pay | Admitting: Internal Medicine

## 2010-06-11 ENCOUNTER — Encounter: Payer: Self-pay | Admitting: Gastroenterology

## 2010-06-12 ENCOUNTER — Telehealth: Payer: Self-pay | Admitting: Family Medicine

## 2010-06-18 ENCOUNTER — Ambulatory Visit: Payer: Self-pay | Admitting: Family Medicine

## 2010-06-18 LAB — CONVERTED CEMR LAB
INR: 3.4
Prothrombin Time: 41.2 s

## 2010-06-24 ENCOUNTER — Telehealth: Payer: Self-pay | Admitting: Family Medicine

## 2010-07-01 ENCOUNTER — Ambulatory Visit: Payer: Self-pay | Admitting: Internal Medicine

## 2010-07-09 HISTORY — PX: COLONOSCOPY: SHX174

## 2010-07-10 ENCOUNTER — Ambulatory Visit: Payer: Self-pay | Admitting: Internal Medicine

## 2010-07-11 ENCOUNTER — Ambulatory Visit: Payer: Self-pay | Admitting: Cardiology

## 2010-07-19 ENCOUNTER — Encounter (INDEPENDENT_AMBULATORY_CARE_PROVIDER_SITE_OTHER): Payer: Self-pay | Admitting: *Deleted

## 2010-07-19 ENCOUNTER — Telehealth: Payer: Self-pay | Admitting: Family Medicine

## 2010-07-19 ENCOUNTER — Ambulatory Visit: Payer: Self-pay | Admitting: Gastroenterology

## 2010-07-25 ENCOUNTER — Ambulatory Visit (HOSPITAL_COMMUNITY): Admission: RE | Admit: 2010-07-25 | Discharge: 2010-07-25 | Payer: Self-pay | Admitting: Gastroenterology

## 2010-07-25 ENCOUNTER — Ambulatory Visit: Payer: Self-pay | Admitting: Gastroenterology

## 2010-07-29 ENCOUNTER — Ambulatory Visit: Payer: Self-pay | Admitting: Internal Medicine

## 2010-08-12 ENCOUNTER — Ambulatory Visit: Payer: Self-pay | Admitting: Internal Medicine

## 2010-08-12 LAB — CONVERTED CEMR LAB: INR: 3.8

## 2010-08-23 ENCOUNTER — Ambulatory Visit (HOSPITAL_COMMUNITY)
Admission: RE | Admit: 2010-08-23 | Discharge: 2010-08-23 | Payer: Self-pay | Source: Home / Self Care | Attending: Gastroenterology | Admitting: Gastroenterology

## 2010-08-26 ENCOUNTER — Ambulatory Visit: Payer: Self-pay | Admitting: Internal Medicine

## 2010-08-26 LAB — CONVERTED CEMR LAB
INR: 2.7
Prothrombin Time: 32.1 s

## 2010-09-05 ENCOUNTER — Ambulatory Visit
Admission: RE | Admit: 2010-09-05 | Discharge: 2010-09-05 | Payer: Self-pay | Source: Home / Self Care | Attending: Family Medicine | Admitting: Family Medicine

## 2010-09-05 DIAGNOSIS — R05 Cough: Secondary | ICD-10-CM

## 2010-09-23 ENCOUNTER — Ambulatory Visit
Admission: RE | Admit: 2010-09-23 | Discharge: 2010-09-23 | Payer: Self-pay | Source: Home / Self Care | Attending: Internal Medicine | Admitting: Internal Medicine

## 2010-10-08 NOTE — Consult Note (Signed)
Summary: [facility]  [facility]   Imported By: Sherian Rein 05/24/2010 14:49:14  _____________________________________________________________________  External Attachment:    Type:   Image     Comment:   External Document

## 2010-10-08 NOTE — Consult Note (Signed)
Summary: Prior Authorization & Approval for [employer]  Prior Authorization & Approval for [employer]   Imported By: Lanelle Bal 04/10/2010 08:56:23  _____________________________________________________________________  External Attachment:    Type:   Image     Comment:   External Document

## 2010-10-08 NOTE — Procedures (Signed)
Summary: Colonoscopy  Patient: [name] Note: All result statuses are Final unless otherwise noted.  Tests: (1) Colonoscopy (COL)   COL Colonoscopy           DONE     [facility]     [address] [place], [place]  [address]           COLONOSCOPY PROCEDURE REPORT           PATIENT:  [name], [name]  MR#:  [id number]     BIRTHDATE:  07-01-1931, 79 yrs. old  GENDER:  male     ENDOSCOPIST:  Rachael Fee, MD     REF. BY:  Eustaquio Boyden, M.D.     PROCEDURE DATE:  07/25/2010     PROCEDURE:  Colonoscopy with biopsy and snare polypectomy     ASA CLASS:  Class II     INDICATIONS:  chronic diarrhea (2-3 months)     MEDICATIONS:   Fentanyl 75 mcg IV, Versed 4 mg IV     DESCRIPTION OF PROCEDURE:   After the risks benefits and     alternatives of the procedure were thoroughly explained, informed     consent was obtained.  Digital rectal exam was performed and     revealed no rectal masses.   The  endoscope was introduced through     the anus and advanced to the terminal ileum which was intubated     for a short distance, without limitations.  The quality of the     prep was good, using MoviPrep.  The instrument was then slowly     withdrawn as the colon was fully examined.     <<PROCEDUREIMAGES>>     FINDINGS:  Five small sessile polyps were found, all were removed     with cold snare, three were retrieved and sent to pathology (jar     2). These ranged in size from 2 to 3mm across, located in     ascending and transverse segments (see image4).  The terminal     ileum appeared normal (see image3).  This was otherwise a normal     examination of the colon. Colonic mucosa was randomly biopsied     (pathology jar 1) (see image1, image2, and image5).   Retroflexed     views in the rectum revealed no abnormalities.    The scope was     then withdrawn from the patient and the procedure completed.     COMPLICATIONS:  None           ENDOSCOPIC IMPRESSION:     1) Five  small polyps, all removed. Three were retrieved and sent     to pathology.     2) Normal terminal ileum; no sign of Crohn's disease.     3) Otherwise normal examination.  No colitis, no cancers.  Colon     was randomly biopsied to check for microscopic colitis           RECOMMENDATIONS:     1) If the polyp(s) removed today are proven to be adenomatous     (pre-cancerous) polyps, you will need a colonoscopy in 3 years.     2) You will receive a letter within 1-2 weeks with the results     of your biopsy as well as final recommendations. Please call my     office if you have not received a letter after 3 weeks.     3) If colonic  biopsies show microscopic colitis, you will be     started on the appropriate medicine.           ______________________________     Rachael Fee, MD           n.     eSIGNED:   Rachael Fee at 07/25/2010 01:42 PM           Roxy Cedar, [id number]  Note: An exclamation mark (!) indicates a result that was not dispersed into the flowsheet. Document Creation Date: 07/25/2010 1:42 PM _______________________________________________________________________  (1) Order result status: Final Collection or observation date-time: 07/25/2010 13:33 Requested date-time:  Receipt date-time:  Reported date-time:  Referring Physician:   Ordering Physician: Rob Bunting [phone]) Specimen Source:  Source: Launa Grill Order Number: [id number] Lab site:

## 2010-10-08 NOTE — Letter (Signed)
Summary: [facility] Instructions  [facility]  [address] [place], [place] [address]   Phone: [phone]  Fax: [phone]       [name]    Feb 19, 1931    MRN: [id number]        Procedure Day /Date:07/25/10 THURS     Arrival MWUX:3244 am     Procedure Time:1245 pm     Location of Procedure:                     X  [facility] ( Outpatient Registration)                        PREPARATION FOR COLONOSCOPY WITH MOVIPREP   Starting 5 days prior to your procedure 07/20/10 do not eat nuts, seeds, popcorn, corn, beans, peas,  salads, or any raw vegetables.  Do not take any fiber supplements (e.g. Metamucil, Citrucel, and Benefiber).  THE DAY BEFORE YOUR PROCEDURE         DATE: 07/24/10  DAY: WED  1.  Drink clear liquids the entire day-NO SOLID FOOD  2.  Do not drink anything colored red or purple.  Avoid juices with pulp.  No orange juice.  3.  Drink at least 64 oz. (8 glasses) of fluid/clear liquids during the day to prevent dehydration and help the prep work efficiently.  CLEAR LIQUIDS INCLUDE: Water Jello Ice Popsicles Tea (sugar ok, no milk/cream) Powdered fruit flavored drinks Coffee (sugar ok, no milk/cream) Gatorade Juice: apple, white grape, white cranberry  Lemonade Clear bullion, consomm, broth Carbonated beverages (any kind) Strained chicken noodle soup Hard Candy                             4.  In the morning, mix first dose of MoviPrep solution:    Empty 1 Pouch A and 1 Pouch B into the disposable container    Add lukewarm drinking water to the top line of the container. Mix to dissolve    Refrigerate (mixed solution should be used within 24 hrs)  5.  Begin drinking the prep at 5:00 p.m. The MoviPrep container is divided by 4 marks.   Every 15 minutes drink the solution down to the next mark (approximately 8 oz) until the full liter is complete.   6.  Follow completed prep with 16 oz of clear liquid of your choice  (Nothing red or purple).  Continue to drink clear liquids until bedtime.  7.  Before going to bed, mix second dose of MoviPrep solution:    Empty 1 Pouch A and 1 Pouch B into the disposable container    Add lukewarm drinking water to the top line of the container. Mix to dissolve    Refrigerate  THE DAY OF YOUR PROCEDURE      DATE:07/25/10 DAY: THURS  Beginning at 645 a.m. (5 hours before procedure):         1. Every 15 minutes, drink the solution down to the next mark (approx 8 oz) until the full liter is complete.  2. Follow completed prep with 16 oz. of clear liquid of your choice.    3. You may drink clear liquids until 845 am  (4 HOURS BEFORE PROCEDURE).   MEDICATION INSTRUCTIONS  Unless otherwise instructed, you should take regular prescription medications with a small sip of water   as early as possible the morning of your procedure.  Stop taking Coumadin on  07/20/10  (5 days before procedure).  Additional medication instructions: You will be contaced by our office prior to your procedure for directions on holding your Coumadin/Warfarin.  If you do not hear from our office 1 week prior to your scheduled procedure, please call [phone] to discuss.   We have contacted Dr Sharen Hones         OTHER INSTRUCTIONS  You will need a responsible adult at least 75 years of age to accompany you and drive you home.   This person must remain in the waiting room during your procedure.  Wear loose fitting clothing that is easily removed.  Leave jewelry and other valuables at home.  However, you may wish to bring a book to read or  an iPod/MP3 player to listen to music as you wait for your procedure to start.  Remove all body piercing jewelry and leave at home.  Total time from sign-in until discharge is approximately 2-3 hours.  You should go home directly after your procedure and rest.  You can resume normal activities the  day after your procedure.  The day of  your procedure you should not:   Drive   Make legal decisions   Operate machinery   Drink alcohol   Return to work  You will receive specific instructions about eating, activities and medications before you leave.    The above instructions have been reviewed and explained to me by   _______________________    I fully understand and can verbalize these instructions _____________________________ Date _________

## 2010-10-08 NOTE — Miscellaneous (Signed)
Summary: movi  Clinical Lists Changes  Medications: Added new medication of MOVIPREP 100 GM  SOLR (PEG-KCL-NACL-NASULF-NA ASC-C) As per prep instructions. - Signed Rx of MOVIPREP 100 GM  SOLR (PEG-KCL-NACL-NASULF-NA ASC-C) As per prep instructions.;  #1 x 0;  Signed;  Entered by: Chales Abrahams CMA (AAMA);  Authorized by: Rachael Fee MD;  Method used: Electronically to [facility] [address], [place], [place], [place]  [id number], Ph: [phone], Fax: [phone]    Prescriptions: MOVIPREP 100 GM  SOLR (PEG-KCL-NACL-NASULF-NA ASC-C) As per prep instructions.  #1 x 0   Entered by:   Chales Abrahams CMA (AAMA)   Authorized by:   Rachael Fee MD   Signed by:   Chales Abrahams CMA (AAMA) on 07/19/2010   Method used:   Electronically to        [facility] (retail)       [address]       [place], [place]  [id number]       Ph: [phone]       Fax: [phone]   RxID:   [id number]

## 2010-10-08 NOTE — Progress Notes (Signed)
Summary: coumadin hold   Phone Note Outgoing Call   Call placed by: Chales Abrahams CMA Duncan Dull),  July 19, 2010 3:30 PM Summary of Call: called Dr Nicanor Alcon office for an ASAP response had to leave a message on the triage nurse line.  I will try back before 5 pm. Initial call taken by: Chales Abrahams CMA Duncan Dull),  July 19, 2010 3:31 PM  Follow-up for Phone Call        Patty from Dr. Larae Grooms called to see if patient could stop his INR tomorrow so that he can have his procedure done next Thursday.     Additional Follow-up for Phone Call Additional follow up Details #1::        Ok to do.  thanks.  please come find me if response needed quick like above request.  please call patient and ask to hold coumadin. Additional Follow-up by: Eustaquio Boyden  MD,  July 19, 2010 5:09 PM    Additional Follow-up for Phone Call Additional follow up Details #2::    Called patient to let him know to start holding coumadin tomorrow.  Follow-up by: Melody Comas,  July 19, 2010 5:20 PM

## 2010-10-08 NOTE — Assessment & Plan Note (Signed)
Summary: FLU SHOT/CLE  Nurse Visit   Allergies: 1)  ! Sulfadiazine (Sulfadiazine) 2)  ! Penicillin V Potassium (Penicillin V Potassium) 3)  ! Axid (Nizatidine)  Immunizations Administered:  Influenza Vaccine # 1:    Vaccine Type: Fluvax 3+    Site: left deltoid    Mfr: GlaxoSmithKline    Dose: 0.5 ml    Route: IM    Given by: Mervin Hack CMA (AAMA)    Exp. Date: 03/08/2011    Lot #: [id number]    VIS given: 04/02/10 version given May 30, 2010.  Flu Vaccine Consent Questions:    Do you have a history of severe allergic reactions to this vaccine? no    Any prior history of allergic reactions to egg and/or gelatin? no    Do you have a sensitivity to the preservative Thimersol? no    Do you have a past history of Guillan-Barre Syndrome? no    Do you currently have an acute febrile illness? no    Have you ever had a severe reaction to latex? no    Vaccine information given and explained to patient? yes  Orders Added: 1)  Flu Vaccine 64yrs + [90658] 2)  Admin 1st Vaccine [09811]

## 2010-10-08 NOTE — Letter (Signed)
Summary: Anticoagulation Modification Letter  [facility]  [address] [place], [place] [address]   Phone: [phone]  Fax: [phone]    July 19, 2010  Re:    [name] DOB:    04/22/1931 MRN:    [id number]    Dear Dr Sharen Hones,  We have scheduled the above patient for an endoscopic procedure. Our records show that  he/she is on anticoagulation therapy. Please advise as to how long the patient may come off their therapy of Coumadin prior to the scheduled procedure(s) on 07/25/10.  Please fax back/or route the completed form to Patty  at [phone].  Thank you for your help with this matter.  Sincerely,  Chales Abrahams CMA (AAMA)   Physician Recommendation:  Hold Plavix 7 days prior ________________  Hold Coumadin 5 days prior ____________  Other ______________________________     Appended Document: Anticoagulation Modification Letter put in bin to fax back - rec hold coumadin 5 d prior to procedure, don't think bridging necessary.  rec restart after colonoscpy

## 2010-10-08 NOTE — Procedures (Signed)
Summary: EGD   EGD  Procedure date:  06/02/2000  Findings:      Location: [facility]                     [place]  Patient:    [name], [name]                       MRN: [id number] Proc. Date: 06/02/00 Adm. Date:  [id number] Disc. Date: [id number] Attending:  Sabino Gasser                           Procedure Report  PROCEDURE: Upper endoscopy.  ENDOSCOPIST: Sabino Gasser, M.D.  INDICATIONS FOR PROCEDURE: Hemoccult positivity.  ANESTHESIA: Demerol 75 mg, Versed 7 mg.  DESCRIPTION OF PROCEDURE: With the patient mildly sedated and in the left lateral decubitus position, the Olympus videoscopic endoscope was inserted into the mouth and passed under direct vision through the esophagus into the stomach.  The fundus, body, antrum, duodenal bulb, and second portion of the duodenum were all visualized and photographs were taken.  The endoscope was pulled back to the stomach after taking circumferential views of the duodenal mucosa.  In the stomach the antrum appeared quite erythematous.  This was photographed and biopsied.  The endoscope was placed in retroflexion for view of the stomach from below and was photographed.  The endoscope was then straightened and pulled back from distal to proximal stomach, taking circumferential views of the entire gastric and remaining esophageal mucosa, all of which appeared otherwise normal.  The patients vital signs and pulse oximetry remained stable.  The patient tolerated the procedure well without apparent complications.  FINDINGS: Erythema of the antrum.  Await biopsy report.  Otherwise unremarkable examination.  PLAN: Proceed with colonoscopy. DD:  06/02/00 TD:  06/03/00 Job: [id number]

## 2010-10-08 NOTE — Progress Notes (Signed)
Summary: f/u miralax trial  Phone Note Outgoing Call   Summary of Call: called to see how miralax trial is going.  spoke with wife.  seems to be going well.  not much issues with diarrhea in last week.  will continue to moniotr, asked to call me with update in1-2 wks.  ahs appt with Dr. Christella Hartigan for colonsocopy 07/19/2010. Initial call taken by: Eustaquio Boyden  MD,  June 24, 2010 6:02 PM

## 2010-10-08 NOTE — Assessment & Plan Note (Signed)
History of Present Illness Visit Type: Initial Consult Primary GI MD: Rob Bunting MD Primary Provider: Eustaquio Boyden  MD Requesting Provider: Eustaquio Boyden  MD Chief Complaint: LLQ and RLQ abd tenderness on exam and explosive watery loose diarrhea x 10 weeks. Marland Kitchen  History of Present Illness:     very pleasant 77 year old man who has had about 2 months of diarrhea, vomiting and gassiness (upper and lower).  Occured somewhat gradually, but a clear difference than previously.  Can go 6-7 times a day, getting up in night as well.  Non bloody.  Overall stable weight.    he has had stool testing through his primary care physician showing C. difficile negative, routine culture negative. There was no abdominal x-rays suggesting large stool burden and so he tried MiraLax to see if there was some constipation related diarrhea. This was  a good idea however the MiraLax seem to make it worse.  he had a CT scan last week that was normal except for a contracted small gallbladder.           Current Medications (verified): 1)  Lipitor 40 Mg  Tabs (Atorvastatin Calcium) .... Take One By Mouth Daily 2)  Flonase 50 Mcg/act  Susp (Fluticasone Propionate) .... One Sqiurt Each Nostril Two Times A Day As Needed. 3)  Tricor 145 Mg  Tabs (Fenofibrate) .... Take One By Mouth Daily 4)  Valtrex 1 Gm  Tabs (Valacyclovir Hcl) .... As Needed 5)  Aciphex 20 Mg  Tbec (Rabeprazole Sodium) .... Take One By Mouth Daily 6)  Coq-10 100 Mg  Caps (Coenzyme Q10) .... Take One By Mouth At Bedtime 7)  Effexor 75 Mg  Tabs (Venlafaxine Hcl) .... One Tab By Mouth Once Daily 8)  Warfarin Sodium 5 Mg  Tabs (Warfarin Sodium) .... As Directed 9)  Warfarin Sodium 3 Mg Tabs (Warfarin Sodium) .Marland Kitchen.. 1 Daily By Mouth Are As Directed 10)  Metoprolol Tartrate 25 Mg Tabs (Metoprolol Tartrate) .... Half Tablet Twice Daily 11)  Vitamin B-12 Cr 2000 Mcg Cr-Tabs (Cyanocobalamin) .Marland Kitchen.. 1 Tab Under The Tongue Every Other Day 12)  Vitamin D  1000 Unit Tabs (Cholecalciferol) .... One Tab By Mouth Two Times A Day 13)  Oxybutynin Chloride 5 Mg Tabs (Oxybutynin Chloride) .... One By Mouth Two Times A Day 14)  Prenatal Vitamins 0.8 Mg Tabs (Prenatal Multivit-Min-Fe-Fa) .Marland Kitchen.. 1 By Mouth Once Daily 15)  Imodium A-D 2 Mg Tabs (Loperamide Hcl) .... By Mouth As Directed As Needed 16)  Zofran 4 Mg Tabs (Ondansetron Hcl) .... One By Mouth Q4 Hours As Needed Nausea  Allergies (verified): 1)  ! Sulfadiazine (Sulfadiazine) 2)  ! Penicillin V Potassium (Penicillin V Potassium) 3)  ! Axid (Nizatidine)  Past History:  Past Medical History: GERD Coronary artery disease Renal insufficiency (baseline Cr 1.7) Depression h/o PEs and phlebitis Urge incontinence and prev elevated PSA (that normalized) followed by Dr. Annabell Howells, rec no rpt unless urinary sxs    Past Surgical History: Tonsillectomy 1965 L Herniorrhaphy 1979 Cystoscopy for Kidney Stones 1992 Explor Lap w/Incidental Appendectomy 1996 CATH 30% one vessel 2000 CT abd & pelvis, scarring of right lung,, stable negative O/W 11/02 CT abd. & pelvis, ? stones, right LL scarring 03/02 V/Q scan negative 10/00 Knee arthroscopy right ([place]) 03/24/02 Colonoscopy ([place]  wnl 1998 Right fem. neck fx ORIF ([place]) 08/03/02 Echo Mild Aortic Valve Calcification EF 55% Basically Nml  12/28/07 Carotid U/S NML 12/28/07 EEG Nml 11/12/09 MRI Head Nml 3/11    Family History: Father:  Died at age 61 of natural causes Mother: Mother died at age 54 of stroke and high blood pressure Siblings: Three brothers, one deceased at age 29 from prostate cancer with metastatic disease. 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  All other review of systems was otherwise negative.   Vital Signs:  Patient profile:   77 year old male Height:      64.5 inches Weight:      176 pounds BMI:     29.85 Pulse rate:   74 / minute Pulse rhythm:   regular BP sitting:   108 / 54  (left arm) Cuff size:   regular  Vitals Entered By: Christie Nottingham CMA Duncan Dull) (July 19, 2010 2:54 PM)  Physical Exam  Additional Exam:  Constitutional: generally well appearing Psychiatric: alert and oriented times 3 Eyes: extraocular movements intact Mouth: oropharynx moist, no lesions Neck: supple, no lymphadenopathy Cardiovascular: heart regular rate and rythm Lungs: CTA bilaterally Abdomen: soft, non-tender, non-distended, no obvious ascites, no peritoneal signs, normal bowel sounds Extremities: no lower extremity edema bilaterally Skin: no lesions on visible extremities    Impression & Recommendations:  Problem # 1:  diarrhea, recent vomiting is not very common for gallbladder problems to cause diarrhea however his gallbladder was fairly contracted on a CT scan perhaps his chronic cholecystitis. We will arrange for abdominal ultrasound as well as a HIDA scan to evaluate that further. His diarrhea will otherwise be worked up with colonoscopy and I would like to do this in the next week or so. He is on Coumadin for personal history of blood clots, he has a family history of blood clots as well. We will communicate with his primary care physician about him holding the Coumadin altogether or bridging with Lovenox.  Patient Instructions: 1)  You will be scheduled to have a colonoscopy. 2)  Should come off of coumadin for 5 days prior to procedure, we will contact PCP about holding the coumadin (may need a lovenox bridge). 3)  Will arrange HIDA scan and abd ultrasound to check for gallstones, signs of chronic  cholecystitis. 4)  The medication list was reviewed and reconciled.  All changed / newly prescribed medications were explained.  A complete medication list was provided to the patient / caregiver.  Appended Document: Orders Update/MOVI    Clinical Lists Changes  Orders: Added new Test order of ZCOL (ZCOL) - Signed Added new Test order of Ultrasound Abdomen (UAS) - Signed Added new Referral order of HIDA Scan (HIDA SCAN) - Signed

## 2010-10-08 NOTE — Progress Notes (Signed)
Summary: diarrhea x 3 weeks  Phone Note Call from Patient Call back at [facility] Phone [phone]   Caller: Patient Summary of Call: Pt states he has had diarrhea for 3 weeks.  He has been taking immodium, which helps some, but the diarrhea keeps coming back.  No nausea, fever or severe abd pain.  States he is drinking plenty of fluids and avoiding dairy products.  Advised pt he needs office visit for evaluation.  Appt made for tomorrow. Initial call taken by: Lowella Petties CMA,  June 05, 2010 9:27 AM  Follow-up for Phone Call        thnk you. Follow-up by: Eustaquio Boyden  MD,  June 05, 2010 10:18 AM

## 2010-10-08 NOTE — Procedures (Signed)
Summary: EGD                         [facility]  Patient:    [name], [name]                       MRN: [id number] Proc. Date: 06/22/00 Adm. Date:  [id number] Disc. Date: [id number] Attending:  Sabino Gasser                           Procedure Report  PROCEDURE:  Upper endoscopy.  ANESTHESIA:  Demerol 75 mg, Versed 7 mg.  DESCRIPTION OF PROCEDURE:  With the patient mildly sedated in the left lateral decubitus position, the Olympus videoscopic endoscope inserted in the mouth and passed under direct vision through the esophagus, which appeared normal, into the stomach, fundus, body, antrum, all visualized.  The antrum had some erythema, which we biopsied.  It may have been normal.  We entered into the duodenal bulb and the second portion of the duodenum, both of which appeared normal and were photographed.  From this point, the endoscope was slowly withdrawn, taking circumferential views of the entire duodenal mucosa until the endoscope had been pulled back in the stomach, placed in retroflexion to view the stomach from below, which appeared normal.  It was photographed.  The endoscope was straightened and withdrawn, taking circumferential views of the entire gastric and esophageal mucosa, which otherwise appeared normal.  The patients vital signs and pulse oximetry remained stable.  The patient tolerated the procedure without apparent complications.  FINDINGS:  Mild erythema of the antrum, await biopsy report, otherwise unremarkable endoscopic exam.  The patient will call me for results and follow up with me as an outpatient. DD:  06/22/00 TD:  06/22/00 Job: [id number]

## 2010-10-08 NOTE — Assessment & Plan Note (Signed)
Summary: TICK ON BACK OF RIGHT LEG/RBH   Vital Signs:  Patient profile:   77 year old male Height:      64.5 inches Weight:      179.8 pounds BMI:     30.50 Temp:     97.8 degrees F oral Pulse rate:   68 / minute Pulse rhythm:   regular BP sitting:   90 / 60  (left arm) Cuff size:   regular  Vitals Entered By: Benny Lennert CMA (AAMA) (Jan 10, 2010 8:45 AM) CC:      ? TICK ON BACK OF LEG   History of Present Illness:                  ? TICK ON BACK OF LEG  77 year old male, pleastant noticed something on the back of his right leg and has a little raised area. Thought it may have been a tick -- was not really sure.  Now slightly raised, scab formation  ROS: GEN: No acute illnesses, no fevers, chills, sweats, fatigue, weight loss, or URI sx. GI: No n/v/d Pulm: No SOB, cough, wheezing Interactive and getting along well at home.  Otherwise, ROS is as per the HPI.   GEN: Well-developed,well-nourished,in no acute distress; alert,appropriate and cooperative throughout examination HEENT: Normocephalic and atraumatic without obvious abnormalities. No apparent alopecia or balding. Ears, externally no deformities PULM: Breathing comfortably in no respiratory distress EXT: No clubbing, cyanosis, or edema, r post leg with 3 mm raised scab PSYCH: Normally interactive. Cooperative during the interview. Pleasant. Friendly and conversant. Not anxious or depressed appearing. Normal, full affect.     Allergies: 1)  ! Sulfadiazine (Sulfadiazine) 2)  ! Penicillin V Potassium (Penicillin V Potassium) 3)  ! Axid (Nizatidine)   Impression & Recommendations:  Problem # 1:  ABRASION, LEG (ICD-916.0) DOI 12/10/2009  reassured no tick neosporin only  theoretically could be atypical bcc, if does not heal up, f/u for bx  Complete Medication List: 1)  Lipitor 40 Mg Tabs (Atorvastatin calcium) .... Take one by mouth daily 2)  Flonase 50 Mcg/act Susp (Fluticasone propionate) .... One  sqiurt each nostril two times a day as needed. 3)  Tricor 145 Mg Tabs (Fenofibrate) .... Take one by mouth daily 4)  Valtrex 1 Gm Tabs (Valacyclovir hcl) .... As needed 5)  Aciphex 20 Mg Tbec (Rabeprazole sodium) .... Take one by mouth daily 6)  Coq-10 100 Mg Caps (Coenzyme q10) .... Take one by mouth at bedtime 7)  Effexor 75 Mg Tabs (Venlafaxine hcl) .... One tab by mouth once daily 8)  Warfarin Sodium 5 Mg Tabs (Warfarin sodium) .... As directed 9)  Warfarin Sodium 3 Mg Tabs (Warfarin sodium) .Marland Kitchen.. 1 daily by mouth are as directed 10)  Claritin 10 Mg Tabs (Loratadine) .... One tab by mouth once daily as needed. 11)  Metoprolol Succinate 25 Mg Xr24h-tab (Metoprolol succinate) .... One tab by mouth at night. 12)  Vitamin B-12 Cr 2000 Mcg Cr-tabs (Cyanocobalamin) .Marland Kitchen.. 1 tab under the tongue every other day 13)  Vitamin D 1000 Unit Tabs (Cholecalciferol) .... One tab by mouth two times a day 14)  Oxybutynin Chloride 5 Mg Xr24h-tab (Oxybutynin chloride) .... One tab by mouth in am

## 2010-10-08 NOTE — Procedures (Signed)
Summary: Colon Prep/[facility]  Colon Prep/[facility]   Imported By: Lester Ripley 07/25/2010 08:56:26  _____________________________________________________________________  External Attachment:    Type:   Image     Comment:   External Document

## 2010-10-08 NOTE — Assessment & Plan Note (Signed)
Summary: RTC, ASH   Vital Signs:  Patient profile:   77 year old male Weight:      178 pounds BMI:     30.19 Temp:     98.4 degrees F oral Pulse rate:   76 / minute Pulse rhythm:   regular BP sitting:   130 / 72  (left arm) Cuff size:   regular  Vitals Entered By: Sydell Axon LPN (October 04, 2009 9:04 AM) CC: Follow-up after labs   History of Present Illness: Pt here for followup of Vit D def. Taking OTC 1000Iu two times a day and tolerating well, feels much better. He would like to take more. He is having more sxs of urinary urge...needs to go immediately with standing up. Would like to try other medication. Detrol worked but insur stopped it. Pt also has Tremor and would like to be evaluated by Neurology. He had some type of problem with syncope unknown to me last summer in [place] and had EEG done which is reportedly abnormal. He was taken off of his thyroid medicine at that time. I have no record of him being on thyroid medication or having thyroid deficiency.  Problems Prior to Update: 1)  Uri  (ICD-465.9) 2)  Special Screening Malig Neoplasms Other Sites  (ICD-V76.49) 3)  Unspecified Vitamin D Deficiency  (ICD-268.9) 4)  Vitamin B12 Deficiency  (ICD-266.2) 5)  Hypertension  (ICD-401.9) 6)  Tremor  (ICD-781.0) 7)  Hip Pain, Left  (ICD-719.45) 8)  Dry Skin  (ICD-701.1) 9)  Other Malaise and Fatigue  (ICD-780.79) 10)  Low Back Pain, Acute  (ICD-724.2) 11)  Special Screening Malignant Neoplasm of Prostate  (ICD-V76.44) 12)  Hyperglycemia  (ICD-790.29) 13)  Hypertension, Benign Essential  (ICD-401.1) 14)  Contact Dermatitis Due To Poison Oak  (ICD-692.6) 15)  Depression, Recurrent 09/2003  (ICD-311) 16)  Incontinence, Urge  (ICD-788.31) 17)  Renal Insufficiency, Mild  (ICD-588.9) 18)  Coronary Artery Disease  (ICD-414.00) 19)  Renal Calculus, Hx of  (ICD-V13.01) 20)  Deep Venous Thrombophlebitis, Hx of On Chronic Coumadin  (ICD-V12.52) 21)  Allergic Rhinitis,  Allergies/ Rad  (ICD-477.9) 22)  Hsv  (ICD-054.9) 23)  Hypercholesterolemia/ Trig Elevated  (ICD-272.0) 24)  Gerd, H/o Peptic Ulcers  (ICD-530.81) 25)  Thrombocytopenia, Primary Nos  (ICD-287.30) 26)  Encounter For Therapeutic Drug Monitoring  (ICD-V58.83) 27)  Embolism & Infarction, Iatrogenic Pulmonary  (ICD-415.11) 28)  Aftercare, Long-term Use, Anticoagulants  (ICD-V58.61) 29)  Thrombophlebitis  (ICD-451.9)  Medications Prior to Update: 1)  Lipitor 40 Mg  Tabs (Atorvastatin Calcium) .... Take One By Mouth Daily 2)  Flonase 50 Mcg/act  Susp (Fluticasone Propionate) .... One Sqiurt Each Nostril Two Times A Day As Needed. 3)  Tricor 145 Mg  Tabs (Fenofibrate) .... Take One By Mouth Daily 4)  Valtrex 1 Gm  Tabs (Valacyclovir Hcl) .... As Needed 5)  Aciphex 20 Mg  Tbec (Rabeprazole Sodium) .... Take One By Mouth Daily 6)  Coq-10 100 Mg  Caps (Coenzyme Q10) .... Take One By Mouth At Bedtime 7)  Effexor 75 Mg  Tabs (Venlafaxine Hcl) .... One Tab By Mouth Once Daily 8)  Warfarin Sodium 5 Mg  Tabs (Warfarin Sodium) .... As Directed 9)  Warfarin Sodium 3 Mg Tabs (Warfarin Sodium) .Marland Kitchen.. 1 Daily By Mouth Are As Directed 10)  Claritin 10 Mg Tabs (Loratadine) .... One Tab By Mouth Once Daily As Needed. 11)  Metoprolol Succinate 25 Mg Xr24h-Tab (Metoprolol Succinate) .... One Tab By Mouth At Night. 12)  Vitamin  B-12 Cr 2000 Mcg Cr-Tabs (Cyanocobalamin) .Marland Kitchen.. 1 Tab Under The Tongue Every Other Day 13)  Vitamin D 1000 Unit Tabs (Cholecalciferol) .... One Tab By Mouth Two Times A Day  Allergies: 1)  ! Sulfadiazine (Sulfadiazine) 2)  ! Penicillin V Potassium (Penicillin V Potassium) 3)  ! Axid (Nizatidine)  Physical Exam  General:  Well-developed,well-nourished,in no acute distress; alert,appropriate and cooperative throughout examination Head:  Normocephalic and atraumatic without obvious abnormalities. No apparent alopecia or balding. Sinuses nontender. Eyes:  Conjunctiva clear bilaterally.    Ears:  External ear exam shows no significant lesions or deformities.  Otoscopic examination reveals clear canals, tympanic membranes are intact bilaterally without bulging, retraction, inflammation or discharge. Hearing is grossly normal bilaterally. Mild cerumen bilat. Nose:  External nasal examination shows no deformity or inflammation. Nasal mucosa are pink and moist without lesions or exudates. Mouth:  Oral mucosa and oropharynx without lesions or exudates.  Teeth in good repair. Neck:  No deformities, masses, or tenderness noted. Lungs:  Normal respiratory effort, chest expands symmetrically. Lungs are clear to auscultation, no crackles or wheezes. Heart:  Normal rate and regular rhythm. S1 and S2 normal without gallop, murmur, click, rub or other extra sounds.   Impression & Recommendations:  Problem # 1:  UNSPECIFIED VITAMIN D DEFICIENCY (ICD-268.9) Assessment Improved Now therapeutic. May go to three times a day if so desires.  Problem # 2:  TREMOR (ICD-781.0) Assessment: Unchanged Longstanding tremor which has responded reasonably well to betaa blocker. Will refer to Neurology for eval. Orders: Neurology Referral (Neuro)  Problem # 3:  INCONTINENCE, URGE (ICD-788.31) Assessment: Unchanged Will try Oxybutinin. Had used Detrol w/ good response until insuur made him give it up.  Complete Medication List: 1)  Lipitor 40 Mg Tabs (Atorvastatin calcium) .... Take one by mouth daily 2)  Flonase 50 Mcg/act Susp (Fluticasone propionate) .... One sqiurt each nostril two times a day as needed. 3)  Tricor 145 Mg Tabs (Fenofibrate) .... Take one by mouth daily 4)  Valtrex 1 Gm Tabs (Valacyclovir hcl) .... As needed 5)  Aciphex 20 Mg Tbec (Rabeprazole sodium) .... Take one by mouth daily 6)  Coq-10 100 Mg Caps (Coenzyme q10) .... Take one by mouth at bedtime 7)  Effexor 75 Mg Tabs (Venlafaxine hcl) .... One tab by mouth once daily 8)  Warfarin Sodium 5 Mg Tabs (Warfarin sodium) .... As  directed 9)  Warfarin Sodium 3 Mg Tabs (Warfarin sodium) .Marland Kitchen.. 1 daily by mouth are as directed 10)  Claritin 10 Mg Tabs (Loratadine) .... One tab by mouth once daily as needed. 11)  Metoprolol Succinate 25 Mg Xr24h-tab (Metoprolol succinate) .... One tab by mouth at night. 12)  Vitamin B-12 Cr 2000 Mcg Cr-tabs (Cyanocobalamin) .Marland Kitchen.. 1 tab under the tongue every other day 13)  Vitamin D 1000 Unit Tabs (Cholecalciferol) .... One tab by mouth two times a day 14)  Oxybutynin Chloride 5 Mg Xr24h-tab (Oxybutynin chloride) .... One tab by mouth in am  Patient Instructions: 1)  RTC For Comp Exam in a couple mos, labs prior 2)  Refer to Neurology Prescriptions: OXYBUTYNIN CHLORIDE 5 MG XR24H-TAB (OXYBUTYNIN CHLORIDE) one tab by mouth in AM  #30 x 12   Entered and Authorized by:   Shaune Leeks MD   Signed by:   Shaune Leeks MD on 10/04/2009   Method used:   Electronically to        [facility] (retail)       [address]  [place], [place]  [id number]       Ph: [phone]       Fax: [phone]   RxID:   [id number]   Current Allergies (reviewed today): ! SULFADIAZINE (SULFADIAZINE) ! PENICILLIN V POTASSIUM (PENICILLIN V POTASSIUM) ! AXID (NIZATIDINE)

## 2010-10-08 NOTE — Assessment & Plan Note (Signed)
Summary: ROA FOR 1 WEEK FOLLOW-UP/JRR   Vital Signs:  Patient profile:   77 year old male Weight:      179 pounds Temp:     98.2 degrees F oral Pulse rate:   76 / minute Pulse rhythm:   regular BP sitting:   132 / 80  (left arm) Cuff size:   regular  Vitals Entered By: Selena Batten Dance CMA Duncan Dull) (June 11, 2010 8:04 AM) CC: 1 week follow up   History of Present Illness: CC: f/u diarrhea  hasn't had any in 2 days, however did have large amt diarrhea Sunday x 2.  No more abdominal pain.  4wks of diarrhea.  using immodium intermittently.  Feels drinking enough to stay hydrated - gatorade and water.  Being very careful with what he is eating.  Eating egg salad sandwiches, last night went to [employer] and had chicken scampi, salad and breadsticks and water.  Hasn't had anything to eat yet today.    Uses city water.  No recent travel outside of country.  Drinks bottled water at home.    Has seen GI in past, h/o ulcers for which he uses aciphex.  Doesn't remember who he saw.  Colonsocopy WNL per chart 1998.  Current Medications (verified): 1)  Lipitor 40 Mg  Tabs (Atorvastatin Calcium) .... Take One By Mouth Daily 2)  Flonase 50 Mcg/act  Susp (Fluticasone Propionate) .... One Sqiurt Each Nostril Two Times A Day As Needed. 3)  Tricor 145 Mg  Tabs (Fenofibrate) .... Take One By Mouth Daily 4)  Valtrex 1 Gm  Tabs (Valacyclovir Hcl) .... As Needed 5)  Aciphex 20 Mg  Tbec (Rabeprazole Sodium) .... Take One By Mouth Daily 6)  Coq-10 100 Mg  Caps (Coenzyme Q10) .... Take One By Mouth At Bedtime 7)  Effexor 75 Mg  Tabs (Venlafaxine Hcl) .... One Tab By Mouth Once Daily 8)  Warfarin Sodium 5 Mg  Tabs (Warfarin Sodium) .... As Directed 9)  Warfarin Sodium 3 Mg Tabs (Warfarin Sodium) .Marland Kitchen.. 1 Daily By Mouth Are As Directed 10)  Claritin 10 Mg Tabs (Loratadine) .... One Tab By Mouth Once Daily As Needed. 11)  Metoprolol Tartrate 25 Mg Tabs (Metoprolol Tartrate) .... Half Tablet Twice Daily 12)   Vitamin B-12 Cr 2000 Mcg Cr-Tabs (Cyanocobalamin) .Marland Kitchen.. 1 Tab Under The Tongue Every Other Day 13)  Vitamin D 1000 Unit Tabs (Cholecalciferol) .... One Tab By Mouth Two Times A Day 14)  Oxybutynin Chloride 5 Mg Tabs (Oxybutynin Chloride) .... One By Mouth Two Times A Day 15)  Prenatal Vitamins 0.8 Mg Tabs (Prenatal Multivit-Min-Fe-Fa) .Marland Kitchen.. 1 By Mouth Once Daily 16)  Imodium A-D 2 Mg Tabs (Loperamide Hcl) .... By Mouth As Directed As Needed  Allergies: 1)  ! Sulfadiazine (Sulfadiazine) 2)  ! Penicillin V Potassium (Penicillin V Potassium) 3)  ! Axid (Nizatidine)  Past History:  Past Surgical History: Last updated: 02-Jan-2010 Tonsillectomy 1965 L Herniorrhaphy 1979 Cystoscopy for Kidney Stones 1992 Explor Lap w/Incidental Appendectomy 1996 CATH 30% one vessel 2000 CT abd & pelvis, scarring of right lung,, stable negative O/W 11/02 CT abd. & pelvis, ? stones, right LL scarring 03/02 V/Q scan negative 10/00 Knee arthroscopy right ([place]) 03/24/02 Colonoscopy ([place]  wnl 1998 Right fem. neck fx ORIF ([place]) 08/03/02 Echo Mild Aortic Valve Calcification EF 55% Basically Nml  12/28/07 Carotid U/S NML 12/28/07 EEG Nml 11/12/09 MRI Head Nml 3/11  Family History: Last updated: Jan 02, 2010 Father: Died at age 2 of natural causes  Mother: Mother died at age 69 of stroke and high blood pressure Siblings: Three brothers, one deceased at age 43 from prostate cancer with metastatic disease. 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 Annabell Howells, rec no rpt unless urinary sxs PMH-FH-SH reviewed for relevance  Review of Systems       per HPI  Physical Exam  General:  Well-developed,well-nourished,in no acute distress; alert,appropriate and cooperative throughout examination, mildly obese. Abdomen:  firm abd R side > L.  Overall nontender x mild tenderness to deep palpation.  NABS.  Nondistended.  No rebound, no peritonitis.   Impression & Recommendations:  Problem # 1:  DIARRHEA (ICD-787.91) reviewed blood work and stool studies with patient, provided with lab printoff to bring home.  doesn't seem infective, secretory (no diarrhea at night or while fasting).  Abd exam today with continued firm abd, ?consistent with stool load?  Diarrhea doesn't seem to be improving.  refer to GI.  Cr 1.7.  would recommend starting miralax for next week to see if stool load can be relieved (abd xray with mod stool load R and transverse colon).    colonoscopy 1998 WNL.  Last hemoccults 11/2008.  due for them today, would wait until diarrhea resolved.  His updated medication list for this problem includes:    Imodium A-d 2 Mg Tabs (Loperamide hcl) ..... By mouth as directed as needed  Orders: Gastroenterology Referral (GI)  Complete Medication List: 1)  Lipitor 40 Mg Tabs (Atorvastatin calcium) .... Take one by mouth daily 2)  Flonase 50 Mcg/act Susp (Fluticasone propionate) .... One sqiurt each nostril two times a day as needed. 3)  Tricor 145 Mg Tabs (Fenofibrate) .... Take one by mouth daily 4)  Valtrex 1 Gm Tabs (Valacyclovir hcl) .... As needed 5)  Aciphex 20 Mg Tbec (Rabeprazole sodium) .... Take one by mouth daily 6)  Coq-10 100 Mg Caps (Coenzyme q10) .... Take one by mouth at bedtime 7)  Effexor 75 Mg Tabs (Venlafaxine hcl) .... One tab by mouth once daily 8)  Warfarin Sodium 5 Mg Tabs (Warfarin sodium) .... As directed 9)  Warfarin Sodium 3 Mg Tabs (Warfarin sodium) .Marland Kitchen.. 1 daily by mouth are as directed 10)   Claritin 10 Mg Tabs (Loratadine) .... One tab by mouth once daily as needed. 11)  Metoprolol Tartrate 25 Mg Tabs (Metoprolol tartrate) .... Half tablet twice daily 12)  Vitamin B-12 Cr 2000 Mcg Cr-tabs (Cyanocobalamin) .Marland Kitchen.. 1 tab under the tongue every other day 13)  Vitamin D 1000 Unit Tabs (Cholecalciferol) .... One tab by mouth two times a day 14)  Oxybutynin Chloride 5 Mg Tabs (Oxybutynin chloride) .... One by mouth two times a day 15)  Prenatal Vitamins 0.8 Mg Tabs (Prenatal multivit-min-fe-fa) .Marland Kitchen.. 1 by mouth once daily 16)  Imodium A-d 2 Mg Tabs (Loperamide hcl) .... By mouth as directed as needed 17)  Miralax Powd (Polyethylene glycol 3350) .Marland Kitchen.. 17gm in 8 oz fliud daily for possible constipation  Patient Instructions: 1)  Given the diarrhea has not improved, I will send you to the stomach doctor for evaluation of diarrhea.  Please give me a call with who you previously saw. 2)  Good to see you today.  Keep pushing fluids as up to now.   3)  Call clinic with questions. Prescriptions: MIRALAX  POWD (POLYETHYLENE GLYCOL 3350) 17gm in 8 oz fliud daily for possible constipation  #1 x 1   Entered and Authorized by:   Eustaquio Boyden  MD   Signed by:   Eustaquio Boyden  MD on 06/11/2010   Method used:   Electronically to        [facility] (retail)       [address]       [place], [place]  [id number]       Ph: [phone]       Fax: [phone]   RxID:   [id number]   Current Allergies (reviewed today): ! SULFADIAZINE (SULFADIAZINE) ! PENICILLIN V POTASSIUM (PENICILLIN V POTASSIUM) ! AXID (NIZATIDINE)

## 2010-10-08 NOTE — Letter (Signed)
Summary: New Patient letter  [facility]  [address] [place], [place] [address]   Phone: [phone]  Fax: [phone]       06/11/2010 MRN: [id number]  [facility] [address] [place], [place]  [address]  Dear [name],  Welcome to the Gastroenterology Division at [facility].    You are scheduled to see Dr.  Christella Hartigan on 07-19-10 at  3:30pm on the 3rd floor at [facility], [address]  We ask that you try to arrive at our office 15 minutes prior to your appointment time to allow for check-in.  We would like you to complete the enclosed self-administered evaluation form prior to your visit and bring it with you on the day of your appointment.  We will review it with you.  Also, please bring a complete list of all your medications or, if you prefer, bring the medication bottles and we will list them.  Please bring your insurance card so that we may make a copy of it.  If your insurance requires a referral to see a specialist, please bring your referral form from your primary care physician.  Co-payments are due at the time of your visit and may be paid by cash, check or credit card.     Your office visit will consist of a consult with your physician (includes a physical exam), any laboratory testing he/she may order, scheduling of any necessary diagnostic testing (e.g. x-ray, ultrasound, CT-scan), and scheduling of a procedure (e.g. Endoscopy, Colonoscopy) if required.  Please allow enough time on your schedule to allow for any/all of these possibilities.    If you cannot keep your appointment, please call [phone] to cancel or reschedule prior to your appointment date.  This allows [place] the opportunity to schedule an appointment for another patient in need of care.  If you do not cancel or reschedule by 5 p.m. the business day prior to your appointment date, you will be charged a $50.00 late cancellation/no-show fee.    Thank you for choosing  [facility] for your medical needs.  We appreciate the opportunity to care for you.  Please visit [place] at our website  to learn more about our practice.                     Sincerely,                                                             The Gastroenterology Division

## 2010-10-08 NOTE — Consult Note (Signed)
Summary: Dr.[facility],Note  Dr.John Wrenn,[facility],Note   Imported By: Beau Fanny 12/24/2009 13:11:38  _____________________________________________________________________  External Attachment:    Type:   Image     Comment:   External Document  Appended Document: Dr.[facility],Note    Clinical Lists Changes  Observations: Added new observation of PROTEIN, TOT: 2.09 g/dL (78/29/5621 30:86)

## 2010-10-08 NOTE — Assessment & Plan Note (Signed)
Summary: CPX/RBH   Vital Signs:  Patient profile:   77 year old male Weight:      180 pounds Temp:     98.6 degrees F oral Pulse rate:   68 / minute Pulse rhythm:   regular BP sitting:   108 / 60  (left arm) Cuff size:   regular  Vitals Entered By: Sydell Axon LPN (December 03, 2009 10:52 AM) CC: 30 Minute checkup, hemoccult cards given to patient   History of Present Illness: Pt here for Comp Exam. He has seen Neurology and was given an MRI of the brain which looked ok per the pt and he had an EEG whose reoprt  I recieved and  was nml.  Dr Anne Hahn thought the patient should see a Cardiologist altho I have not recieved that note yet. Marland Kitchen He has been seen by Cardiology in 4/09 for dizziness. He had  dizziness and poss syncope in the past which is why I sent him to Cardiology in the first place. His workup was negative. He has vertical vertigo now as he had been having horizontal vertigo when seen in 09. He is tolerating the Oxybutinin added last time for urinary incont and doing better with urination.  He still has fatigue and wants to sleep all the time. His Vit D has been checked and replaced to therapeutic levels and B12 is constantly replaced.  He had catarracts removed successfully in the last year.  Preventive Screening-Counseling & Management  Alcohol-Tobacco     Alcohol drinks/day: 0     Smoking Status: never     Passive Smoke Exposure: yes  Caffeine-Diet-Exercise     Caffeine use/day: 0     Does Patient Exercise: yes     Type of exercise: walks twice a day     Times/week: 7  Problems Prior to Update: 1)  Special Screening Malig Neoplasms Other Sites  (ICD-V76.49) 2)  Unspecified Vitamin D Deficiency  (ICD-268.9) 3)  Vitamin B12 Deficiency  (ICD-266.2) 4)  Hypertension  (ICD-401.9) 5)  Tremor  (ICD-781.0) 6)  Hip Pain, Left  (ICD-719.45) 7)  Dry Skin  (ICD-701.1) 8)  Other Malaise and Fatigue  (ICD-780.79) 9)  Low Back Pain, Acute  (ICD-724.2) 10)  Special  Screening Malignant Neoplasm of Prostate  (ICD-V76.44) 11)  Hyperglycemia  (ICD-790.29) 12)  Hypertension, Benign Essential  (ICD-401.1) 13)  Contact Dermatitis Due To Poison Oak  (ICD-692.6) 14)  Depression, Recurrent 09/2003  (ICD-311) 15)  Incontinence, Urge  (ICD-788.31) 16)  Renal Insufficiency, Mild  (ICD-588.9) 17)  Coronary Artery Disease  (ICD-414.00) 18)  Renal Calculus, Hx of  (ICD-V13.01) 19)  Deep Venous Thrombophlebitis, Hx of On Chronic Coumadin  (ICD-V12.52) 20)  Allergic Rhinitis, Allergies/ Rad  (ICD-477.9) 21)  Hsv  (ICD-054.9) 22)  Hypercholesterolemia/ Trig Elevated  (ICD-272.0) 23)  Gerd, H/o Peptic Ulcers  (ICD-530.81) 24)  Thrombocytopenia, Primary Nos  (ICD-287.30) 25)  Encounter For Therapeutic Drug Monitoring  (ICD-V58.83) 26)  Embolism & Infarction, Iatrogenic Pulmonary  (ICD-415.11) 27)  Aftercare, Long-term Use, Anticoagulants  (ICD-V58.61) 28)  Thrombophlebitis  (ICD-451.9)  Medications Prior to Update: 1)  Lipitor 40 Mg  Tabs (Atorvastatin Calcium) .... Take One By Mouth Daily 2)  Flonase 50 Mcg/act  Susp (Fluticasone Propionate) .... One Sqiurt Each Nostril Two Times A Day As Needed. 3)  Tricor 145 Mg  Tabs (Fenofibrate) .... Take One By Mouth Daily 4)  Valtrex 1 Gm  Tabs (Valacyclovir Hcl) .... As Needed 5)  Aciphex 20 Mg  Tbec (  Rabeprazole Sodium) .... Take One By Mouth Daily 6)  Coq-10 100 Mg  Caps (Coenzyme Q10) .... Take One By Mouth At Bedtime 7)  Effexor 75 Mg  Tabs (Venlafaxine Hcl) .... One Tab By Mouth Once Daily 8)  Warfarin Sodium 5 Mg  Tabs (Warfarin Sodium) .... As Directed 9)  Warfarin Sodium 3 Mg Tabs (Warfarin Sodium) .Marland Kitchen.. 1 Daily By Mouth Are As Directed 10)  Claritin 10 Mg Tabs (Loratadine) .... One Tab By Mouth Once Daily As Needed. 11)  Metoprolol Succinate 25 Mg Xr24h-Tab (Metoprolol Succinate) .... One Tab By Mouth At Night. 12)  Vitamin B-12 Cr 2000 Mcg Cr-Tabs (Cyanocobalamin) .Marland Kitchen.. 1 Tab Under The Tongue Every Other Day 13)   Vitamin D 1000 Unit Tabs (Cholecalciferol) .... One Tab By Mouth Two Times A Day 14)  Oxybutynin Chloride 5 Mg Xr24h-Tab (Oxybutynin Chloride) .... One Tab By Mouth in Am  Allergies: 1)  ! Sulfadiazine (Sulfadiazine) 2)  ! Penicillin V Potassium (Penicillin V Potassium) 3)  ! Axid (Nizatidine)  Past History:  Past Medical History: Last updated: 03/08/2007 GERD Coronary artery disease Renal insufficiency Depression  Family History: Last updated: December 06, 2009 Father: Died at age 104 of natural causes Mother: Mother died at age 66 of stroke and high blood pressure Siblings: Three brothers, one deceased at age 52 from prostate cancer with metastatic disease. 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 & pelvis, ? stones, right LL scarring 03/02 V/Q scan negative 10/00 Knee arthroscopy right ([place]) 03/24/02 Colonoscopy ([place]  wnl 1998 Right fem. neck fx ORIF ([place]) 08/03/02 Echo Mild Aortic Valve Calcification EF 55% Basically Nml  12/28/07 Carotid U/S NML 12/28/07 EEG Nml 11/12/09 MRI Head Nml 3/11  Family History: Father: Died at age 61 of natural causes Mother: Mother died at age 3 of stroke and high blood pressure Siblings: Three brothers, one  deceased at age 27 from prostate cancer with metastatic disease. Brother A 62  Brother A 79 DM Legally Blind One sister died at age 38 with a shoulder operation and resulting blood clot             Depression, self ETOH, brother   DM, brother     Review of Systems General:  Complains of fatigue and sweats; denies chills, fever, weakness, and weight loss; Rare sweats. Eyes:  Denies blurring, discharge, and eye pain; Recent Catarract removal bilat. Can't see up close.. ENT:  Denies decreased hearing, earache, and ringing in ears. CV:  Complains of shortness of breath with exertion and swelling of feet; denies chest pain or discomfort and palpitations; Rare swelling of the feet.Marland Kitchen Resp:  Denies shortness of breath and wheezing. GI:  Complains of nausea; denies abdominal pain, bloody stools, change in bowel habits, constipation, dark tarry stools, diarrhea,  indigestion, loss of appetite, vomiting, vomiting blood, and yellowish skin color; mild in AM . GU:  Complains of nocturia; denies discharge and urinary frequency; once...grenerally improved.. MS:  Complains of joint pain and low back pain; denies muscle aches, cramps, and stiffness; occas knee pain and somtimes in pinned hip.. Derm:  Complains of dryness and itching; denies rash; of the back.. Neuro:  Complains of tremors; denies numbness, poor balance, and tingling; seen by Neurology.Marland Kitchen  Physical Exam  General:  Well-developed,well-nourished,in no acute distress; alert,appropriate and cooperative throughout examination, mildly obese. Head:  Normocephalic and atraumatic without obvious abnormalities. No apparent alopecia or balding. Sinuses nontender. Eyes:  Conjunctiva clear bilaterally.  Ears:  External ear exam shows no significant lesions or deformities.  Otoscopic examination reveals clear canals, tympanic membranes are intact bilaterally without bulging, retraction, inflammation or discharge. Hearing is grossly normal bilaterally. Mild  cerumen bilat. Nose:  External nasal examination shows no deformity or inflammation. Nasal mucosa are pink and moist without lesions or exudates. Mouth:  Oral mucosa and oropharynx without lesions or exudates.  Teeth in good repair. Neck:  No deformities, masses, or tenderness noted. Chest Wall:  No deformities, masses, tenderness or gynecomastia noted. Breasts:  No masses or gynecomastia noted Lungs:  Normal respiratory effort, chest expands symmetrically. Lungs are clear to auscultation, no crackles or wheezes. Heart:  Normal rate and regular rhythm. S1 and S2 normal without gallop, murmur, click, rub or other extra sounds. Abdomen:  Bowel sounds positive,abdomen soft and non-tender without masses, organomegaly or hernias noted. Slightly protuberant when standing, no overt panniculous. Rectal:  No external abnormalities noted. Normal sphincter tone. No rectal masses or tenderness. G neg. Genitalia:  Testes bilaterally descended without nodularity, tenderness or masses. No scrotal masses or lesions. No penis lesions or urethral discharge. Prostate:  Small raised hemangioma of the right posterior scrotal  wall...benign. Prostate smoothe symmetric without lesions or nodularity, 20-30 gms. Msk:  No deformity or scoliosis noted of thoracic or lumbar spine.   Pulses:  R and L carotid,radial,femoral,dorsalis pedis and posterior tibial pulses are full and equal bilaterally Extremities:  No clubbing, cyanosis, edema, or deformity noted with normal full range of motion of all joints.   Neurologic:  No cranial nerve deficits noted. Station and gait are normal. Sensory, motor and coordinative functions appear intact. Mikld vertical nystagmus which readily extinguishes. Skin:  Intact without suspicious lesions or rashes Cervical Nodes:  No lymphadenopathy noted Inguinal Nodes:  No significant adenopathy Psych:  Cognition and judgment appear intact. Alert and cooperative with normal attention span and  concentration. No apparent delusions, illusions, hallucinations   Impression & Recommendations:  Problem # 1:  PSA, INCREASED (ICD-790.93) Assessment New Denies rectal pain, PSA acutely elevated and Rectal exam nml. Will  refer for workup. Orders: Urology Referral (Urology)  Problem # 2:  UNSPECIFIED VITAMIN D DEFICIENCY (ICD-268.9) Assessment: Improved Therapeutic. Cont curr replacement.  Problem # 3:  VITAMIN B12 DEFICIENCY (ICD-266.2) Assessment: Improved Therapeutic but minimally still anemic. Will follow. Cont replacement as is significantly Macrocytic.  Problem # 4:  HYPERTENSION (ICD-401.9) Assessment: Unchanged Stable, cont. His updated medication list for this problem includes:    Metoprolol Succinate 25 Mg Xr24h-tab (Metoprolol succinate) ..... One tab by mouth at night.  BP today: 108/60 Prior BP: 130/72 (10/04/2009)  Labs Reviewed: K+: 4.8 (11/26/2009) Creat: : 1.6 (11/26/2009)   Chol: 141 (11/26/2009)   HDL: 25.40 (11/26/2009)   LDL: 92 (09/22/2008)   TG: 251.0 (11/26/2009)  Problem # 5:  TREMOR (ICD-781.0)  Assessment: Unchanged Metoprolol was icreased by Dr Anne Hahn to improve this...not much effect.  Problem # 6:  OTHER MALAISE AND FATIGUE (ICD-780.79) Assessment: Unchanged Overall imptroved and most labs WNL.  Problem # 7:  HYPERGLYCEMIA (ICD-790.29) Assessment: Unchanged Discussed avoiding swets and carbs to avoid this increasing. Labs Reviewed: Creat: 1.6 (11/26/2009)     Problem # 8:  DEPRESSION, RECURRENT 09/2003 (ICD-311) Assessment: Unchanged  Stable. Cont Effexor...is tolerating well. His updated medication list for this problem includes:    Effexor 75 Mg Tabs (Venlafaxine hcl) ..... One tab by mouth once daily  Problem # 9:  INCONTINENCE, URGE (ICD-788.31) Assessment: Improved Cont Oxybutinin.  Problem # 10:  RENAL INSUFFICIENCY, MILD (ICD-588.9) Assessment: Unchanged Stable with Cr at 1.6. Will cont to follow.  Problem # 11:   HYPERCHOLESTEROLEMIA/ TRIG ELEVATED (ICD-272.0) Assessment: Unchanged Adequate, cont meds as is. His updated medication list for this problem includes:    Lipitor 40 Mg Tabs (Atorvastatin calcium) .Marland Kitchen... Take one by mouth daily    Tricor 145 Mg Tabs (Fenofibrate) .Marland Kitchen... Take one by mouth daily  Labs Reviewed: SGOT: 56 (11/26/2009)   SGPT: 49 (11/26/2009)   HDL:25.40 (11/26/2009), 19.8 (09/22/2008)  LDL:92 (09/22/2008), 79 (09/14/2007)  Chol:141 (11/26/2009), 150 (09/22/2008)  Trig:251.0 (11/26/2009), 193 (09/22/2008)  Problem # 12:  GERD, H/O PEPTIC ULCERS (ICD-530.81) Assessment: Unchanged  Discussedf diet hygiene. Cont Medication. His updated medication list for this problem includes:    Aciphex 20 Mg Tbec (Rabeprazole sodium) .Marland Kitchen... Take one by mouth daily  Diagnostics Reviewed:  Discussed lifestyle modifications, diet, antacids/medications, and preventive measures.  Complete Medication List: 1)  Lipitor 40 Mg Tabs (Atorvastatin calcium) .... Take one by mouth daily 2)  Flonase 50 Mcg/act Susp (Fluticasone propionate) .... One sqiurt each nostril two times a day as needed. 3)  Tricor 145 Mg Tabs (Fenofibrate) .... Take one by mouth daily 4)  Valtrex 1 Gm Tabs (Valacyclovir hcl) .... As needed 5)  Aciphex 20 Mg Tbec (Rabeprazole sodium) .... Take one by mouth daily 6)  Coq-10 100 Mg Caps (Coenzyme q10) .... Take one by mouth at bedtime 7)  Effexor 75 Mg Tabs (Venlafaxine hcl) .... One tab by mouth once daily 8)  Warfarin Sodium 5 Mg Tabs (Warfarin sodium) .... As directed 9)  Warfarin Sodium 3 Mg Tabs (Warfarin sodium) .Marland Kitchen.. 1 daily by mouth are as directed 10)  Claritin 10 Mg Tabs (Loratadine) .... One tab by mouth once daily as needed. 11)  Metoprolol Succinate 25 Mg Xr24h-tab (Metoprolol succinate) .... One tab by mouth at night. 12)  Vitamin B-12 Cr 2000 Mcg Cr-tabs (Cyanocobalamin) .Marland Kitchen.. 1 tab under the tongue every other day 13)  Vitamin D 1000 Unit Tabs (Cholecalciferol) ....  One tab by mouth two times a day 14)  Oxybutynin Chloride 5 Mg Xr24h-tab (Oxybutynin chloride) .... One tab by mouth in am  Patient Instructions: 1)  Refer to Urology. 2)  45 mins spent with pt, 40% in counselling of various maladies and techniques of trmt.

## 2010-10-08 NOTE — Letter (Signed)
Summary: [facility]  [facility]   Imported By: Lanelle Bal 06/03/2010 13:38:53  _____________________________________________________________________  External Attachment:    Type:   Image     Comment:   External Document  Appended Document: [facility]    Clinical Lists Changes  Observations: Added new observation of PAST MED HX: GERD Coronary artery disease Renal insufficiency Depression h/o PEs and phlebitis Urge incontinence and prev elevated PSA (that normalized) followed by Dr. Annabell Howells (06/03/2010 22:18)       Past History:  Past Medical History: GERD Coronary artery disease Renal insufficiency Depression h/o PEs and phlebitis Urge incontinence and prev elevated PSA (that normalized) followed by Dr. Annabell Howells

## 2010-10-08 NOTE — Progress Notes (Signed)
Summary: prior Berkley Harvey is needed for aciphex  Phone Note From Pharmacy   Caller: [facility]/ [facility] Summary of Call: Prior Berkley Harvey is needed for aciphex.  Form is on your desk.  Pt states he has tried prevacid in the past. Initial call taken by: Lowella Petties CMA,  April 04, 2010 8:48 AM  Follow-up for Phone Call        signed.  thanks.  Follow-up by: Crawford Givens MD,  April 04, 2010 1:26 PM  Additional Follow-up for Phone Call Additional follow up Details #1::        Faxed. Additional Follow-up by: Delilah Shan CMA Marquetta Weiskopf Dull),  April 04, 2010 3:07 PM     Appended Document: prior Berkley Harvey is needed for aciphex Prior auth approval given for aciphex.  Form sent for doctor signature and scanning.

## 2010-10-08 NOTE — Assessment & Plan Note (Signed)
Summary: vomitting and diarrhea/rl   Vital Signs:  Patient profile:   77 year old male Weight:      174.25 pounds Temp:     98.3 degrees F oral Pulse rate:   76 / minute Pulse rhythm:   regular BP sitting:   114 / 70  (left arm) Cuff size:   regular  Vitals Entered By: Selena Batten Dance CMA Duncan Dull) (July 10, 2010 9:24 AM) CC: Vomitting and diarrhea   History of Present Illness: CC: v/d.  10 wk h/o vomiting and diarrhea, everything he eats makes him bloated.  + diffuse abd pain with bloating but not localized to one area.  Having large amt gas (eruptation and flatus).  Last emesis Sunday morning, food, NBNB.  No nausea.  Did trial of miralax because thought issue was constipation, initially had formed stool, then started having oozing of stool.  + bowel incontinence with urgency.  Mainly in bed.  Bottom sore while on miralax.  Last night had good night's sleep so feeling somewhat better today.  h/o lactose intolerance, staying away from dairy products.  Back on almond milk.  + ST today.  No fevers/chills, no significant weight changes (5lb loss since last viist but overall stable).  Some abd pain characterized as sharp and crampy.  No blood in stool.  Main issue is bloating.  Also noted bleeding coming from small abrasion R lower leg today.  Pt on coumadin for h/o PE/phlebitis, last check last week INR 3.3 within this lab's normal range.    Has been evaluated for diarrhea x 2 in past 2 months, w/u including CMP with Cr 1.7, CBC WNL, Cdiff negative, stool culture normal, guaiac negative.  Has appt coming up with GI next week.  Uses city water.  No recent travel outside of country.  Drinks bottled water at home.    Current Medications (verified): 1)  Lipitor 40 Mg  Tabs (Atorvastatin Calcium) .... Take One By Mouth Daily 2)  Flonase 50 Mcg/act  Susp (Fluticasone Propionate) .... One Sqiurt Each Nostril Two Times A Day As Needed. 3)  Tricor 145 Mg  Tabs (Fenofibrate) .... Take One By Mouth  Daily 4)  Valtrex 1 Gm  Tabs (Valacyclovir Hcl) .... As Needed 5)  Aciphex 20 Mg  Tbec (Rabeprazole Sodium) .... Take One By Mouth Daily 6)  Coq-10 100 Mg  Caps (Coenzyme Q10) .... Take One By Mouth At Bedtime 7)  Effexor 75 Mg  Tabs (Venlafaxine Hcl) .... One Tab By Mouth Once Daily 8)  Warfarin Sodium 5 Mg  Tabs (Warfarin Sodium) .... As Directed 9)  Warfarin Sodium 3 Mg Tabs (Warfarin Sodium) .Marland Kitchen.. 1 Daily By Mouth Are As Directed 10)  Claritin 10 Mg Tabs (Loratadine) .... One Tab By Mouth Once Daily As Needed. 11)  Metoprolol Tartrate 25 Mg Tabs (Metoprolol Tartrate) .... Half Tablet Twice Daily 12)  Vitamin B-12 Cr 2000 Mcg Cr-Tabs (Cyanocobalamin) .Marland Kitchen.. 1 Tab Under The Tongue Every Other Day 13)  Vitamin D 1000 Unit Tabs (Cholecalciferol) .... One Tab By Mouth Two Times A Day 14)  Oxybutynin Chloride 5 Mg Tabs (Oxybutynin Chloride) .... One By Mouth Two Times A Day 15)  Prenatal Vitamins 0.8 Mg Tabs (Prenatal Multivit-Min-Fe-Fa) .Marland Kitchen.. 1 By Mouth Once Daily 16)  Imodium A-D 2 Mg Tabs (Loperamide Hcl) .... By Mouth As Directed As Needed 17)  Miralax  Powd (Polyethylene Glycol 3350) .Marland Kitchen.. 17gm in 8 Oz Fliud Daily For Possible Constipation  Allergies: 1)  ! Sulfadiazine (Sulfadiazine) 2)  !  Penicillin V Potassium (Penicillin V Potassium) 3)  ! 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 Annabell Howells, rec no rpt unless urinary sxs  Review of Systems       per HPI  Physical Exam  General:  Well-developed,well-nourished,in no acute distress; alert,appropriate and cooperative throughout examination, mildly obese. Mouth:  Oral mucosa and oropharynx  without lesions or exudates.  Teeth in good repair.  MMM Lungs:  Normal respiratory effort, chest expands symmetrically. Lungs are clear to auscultation, no crackles or wheezes. Heart:  Normal rate and regular rhythm. S1 and S2 normal without gallop, murmur, click, rub or other extra sounds. Abdomen:  firmness improved after miralax.  some tenderness to deep palpation throughout.  NABS.  Nondistended.  No rebound, no peritonitis.  No masses/HSM appreciated. Pulses:  2+ rad pulses Extremities:  no pedal edema. + right lower extremity with small bleeding abrasion Skin:  Intact without suspicious lesions or rashes.  no bruising on arm/ribs, no ecchymosis   Impression & Recommendations:  Problem # 1:  DIARRHEA (ICD-787.91) doesn't seem infective, secretory (no diarrhea at night or while fasting).  immodium not helping.  await GI consult.  Given 10+ wks with complaint, will obtain abd CT in interim to eval for colitis/divertic although no fever, bp stable, nl WBC last month on eval.  not too concerned for obstruction given passing gas (eruptation and flatus).  CT will help rule out.  h/o lactose intolerance, but pt endorses abstaining from dairy.  His updated medication list for this problem includes:    Imodium A-d 2 Mg Tabs (Loperamide hcl) ..... By mouth as directed as needed  Orders: Prescription Created Electronically [phone]) Radiology Referral (Radiology)  Problem # 2:  NAUSEA WITH VOMITING (ICD-787.01) with significant bloating.  zofran in case has nausea again.  unclear etiology  Orders: Radiology Referral (Radiology)  Complete Medication List: 1)  Lipitor 40 Mg Tabs (Atorvastatin calcium) .... Take one by mouth daily 2)  Flonase 50 Mcg/act Susp (Fluticasone propionate) .... One sqiurt each nostril two times a day as needed. 3)  Tricor 145 Mg Tabs (Fenofibrate) .... Take one by mouth daily 4)  Valtrex 1 Gm Tabs (Valacyclovir hcl) .... As needed 5)  Aciphex 20 Mg Tbec (Rabeprazole  sodium) .... Take one by mouth daily 6)  Coq-10 100 Mg Caps (Coenzyme q10) .... Take one by mouth at bedtime 7)  Effexor 75 Mg Tabs (Venlafaxine hcl) .... One tab by mouth once daily 8)  Warfarin Sodium 5 Mg Tabs (Warfarin sodium) .... As directed 9)  Warfarin Sodium 3 Mg Tabs (Warfarin sodium) .Marland Kitchen.. 1 daily by mouth are as directed 10)  Metoprolol Tartrate 25 Mg Tabs (Metoprolol tartrate) .... Half tablet twice daily 11)  Vitamin B-12 Cr 2000 Mcg Cr-tabs (Cyanocobalamin) .Marland Kitchen.. 1 tab under the tongue every other day 12)  Vitamin D 1000 Unit Tabs (Cholecalciferol) .... One tab by mouth two times a day 13)  Oxybutynin Chloride 5 Mg Tabs (Oxybutynin chloride) .... One by mouth two times a day 14)  Prenatal Vitamins 0.8 Mg Tabs (Prenatal multivit-min-fe-fa) .Marland Kitchen.. 1 by mouth once daily 15)  Imodium A-d 2 Mg Tabs (Loperamide hcl) .... By mouth as directed as needed 16)  Zofran 4  Mg Tabs (Ondansetron hcl) .... One by mouth q4 hours as needed nausea  Patient Instructions: 1)  sent home with zofran. 2)  Given this has been going on for so long, I think we will obtain a CT scan of your belly to see if there is any inflammation there. 3)  We will call you with results at [phone]. Prescriptions: FLONASE 50 MCG/ACT  SUSP (FLUTICASONE PROPIONATE) one sqiurt each nostril two times a day as needed.  #1MDI x 12   Entered and Authorized by:   Eustaquio Boyden  MD   Signed by:   Eustaquio Boyden  MD on 07/10/2010   Method used:   Electronically to        [facility] (retail)       [address]       [place], [place]  [id number]       Ph: [phone]       Fax: [phone]   RxID:   [id number] ZOFRAN 4 MG TABS (ONDANSETRON HCL) one by mouth q4 hours as needed nausea  #30 x 0   Entered and Authorized by:   Eustaquio Boyden  MD   Signed by:   Eustaquio Boyden  MD on 07/10/2010   Method used:   Electronically to        [facility] (retail)       [address]       [place], [place]  [id number]       Ph: [phone]       Fax: [phone]   RxID:   [id number]    Orders Added: 1)  Prescription Created Electronically [phone] 2)  Radiology Referral [Radiology] 3)  Est. Patient Level III [28315]    Current Allergies (reviewed today): ! SULFADIAZINE (SULFADIAZINE) ! PENICILLIN V POTASSIUM (PENICILLIN V POTASSIUM) ! AXID (NIZATIDINE)

## 2010-10-08 NOTE — Progress Notes (Signed)
Summary: phone note  Phone Note Outgoing Call   Summary of Call: calling to discuss miralax.  no answer. will try again.  2nd try (yesterday LMOVM to return call). Initial call taken by: Eustaquio Boyden  MD,  June 12, 2010 9:13 AM  Follow-up for Phone Call        called to f/u.  pt had 1 episode of diarrhea last night, seems to be slowing down on own.  Discussed that given findings of abd xray, will treat as possible constipation with trial of miralax, asked him to call us in 2 wks for update, return sooner if worsening.  has appt with GI 07/2010. Follow-up by: Eustaquio Boyden  MD,  June 12, 2010 9:42 AM

## 2010-10-08 NOTE — Assessment & Plan Note (Signed)
Summary: follow up after falling/alc   Vital Signs:  Patient profile:   77 year old male Weight:      181.75 pounds Temp:     98.6 degrees F oral Pulse rate:   64 / minute Pulse rhythm:   regular BP sitting:   108 / 56  (left arm) Cuff size:   large  Vitals Entered By: Selena Batten Dance CMA Duncan Dull) (April 16, 2010 11:04 AM) CC: Follow up from fall   History of Present Illness: CC: fall  77 yo presents with wife, who is retired NP.  1 1/2 wks ago at home, tripped over step into [employer], hit left arm and left lateral ribs.  No dizziness or LOC, or passing out.  Not taking med for pain, would like checked out to ensure doing well.  + progressively unsteady since hip fracture 2003.  h/o benign tremor and noticing worsening.  Last saw Dr. Anne Hahn for tremor in march.  Has appt Sept with Dr. Anne Hahn.  Taking metoprolol XR two times a day for this.  On oxybutynin daily since autumn, on chronic warfarin for h/o PEs.  + h/o previous falls when walking dog according to wife.  Current Medications (verified): 1)  Lipitor 40 Mg  Tabs (Atorvastatin Calcium) .... Take One By Mouth Daily 2)  Flonase 50 Mcg/act  Susp (Fluticasone Propionate) .... One Sqiurt Each Nostril Two Times A Day As Needed. 3)  Tricor 145 Mg  Tabs (Fenofibrate) .... Take One By Mouth Daily 4)  Valtrex 1 Gm  Tabs (Valacyclovir Hcl) .... As Needed 5)  Aciphex 20 Mg  Tbec (Rabeprazole Sodium) .... Take One By Mouth Daily 6)  Coq-10 100 Mg  Caps (Coenzyme Q10) .... Take One By Mouth At Bedtime 7)  Effexor 75 Mg  Tabs (Venlafaxine Hcl) .... One Tab By Mouth Once Daily 8)  Warfarin Sodium 5 Mg  Tabs (Warfarin Sodium) .... As Directed 9)  Warfarin Sodium 3 Mg Tabs (Warfarin Sodium) .Marland Kitchen.. 1 Daily By Mouth Are As Directed 10)  Claritin 10 Mg Tabs (Loratadine) .... One Tab By Mouth Once Daily As Needed. 11)  Metoprolol Succinate 25 Mg Xr24h-Tab (Metoprolol Succinate) .... One Tab By Mouth At Night. 12)  Vitamin B-12 Cr 2000 Mcg Cr-Tabs  (Cyanocobalamin) .Marland Kitchen.. 1 Tab Under The Tongue Every Other Day 13)  Vitamin D 1000 Unit Tabs (Cholecalciferol) .... One Tab By Mouth Two Times A Day 14)  Oxybutynin Chloride 5 Mg Xr24h-Tab (Oxybutynin Chloride) .... One Tab By Mouth in Am  Allergies: 1)  ! Sulfadiazine (Sulfadiazine) 2)  ! Penicillin V Potassium (Penicillin V Potassium) 3)  ! Axid (Nizatidine)  Past History:  Past medical, surgical, family and social histories (including risk factors) reviewed for relevance to current acute and chronic problems.  Past Medical History: GERD Coronary artery disease Renal insufficiency Depression h/o PEs and phlebitis  Past Surgical History: Reviewed history from 12/03/2009 and no changes required. Tonsillectomy 1965 L Herniorrhaphy 1979 Cystoscopy for Kidney Stones 1992 Explor Lap w/Incidental Appendectomy 1996 CATH 30% one vessel 2000 CT abd & pelvis, scarring of right lung,, stable negative O/W 11/02 CT abd. & pelvis, ? stones, right LL scarring 03/02 V/Q scan negative 10/00 Knee arthroscopy right ([facility]) 03/[place]/02 [place]  [place] 1998 Right fem. neck fx ORIF ([facility]) 08/03/02 Echo Mild Aortic Valve Calcification EF 55% Basically Nml  12/28/07 Carotid U/S NML 12/28/07 EEG Nml 11/12/09 MRI Head Nml 3/11  Family History: Reviewed history from 12/03/2009 and no changes required. Father: Died at  age 30 of natural causes Mother: Mother died at age 12 of stroke and high blood pressure Siblings: Three brothers, one deceased at age 27 from prostate cancer with metastatic disease. Brother A 51  Brother A 79 DM Legally Blind One sister died at age 21 with a shoulder operation and resulting blood clot             Depression, self ETOH, brother   DM, brother     Social History: Reviewed history from 11/19/2007 and no changes required. 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 Head:  Normocephalic and atraumatic without obvious abnormalities. No apparent alopecia or balding. Eyes:  No corneal or conjunctival inflammation noted. EOMI. Perrla. Nose:  External nasal examination shows no deformity or inflammation. Nasal mucosa are pink and moist without lesions or exudates. Mouth:  Oral mucosa and oropharynx without lesions or exudates.  Teeth in good repair. Neck:  No deformities, masses, or tenderness noted.  no bruits Chest Wall:  tender to palpation left lateral ribs  ~4-7 Lungs:  Normal respiratory effort, chest expands symmetrically. Lungs are clear to auscultation, no crackles or wheezes. Heart:  Normal rate and regular rhythm. S1 and S2 normal without gallop, murmur, click, rub or other extra sounds. Msk:  No deformity or scoliosis noted of thoracic or lumbar spine.   Pulses:  2+ periph pulses Extremities:  No clubbing, cyanosis, edema, or deformity noted with normal full range of motion of all joints.   Neurologic:  CN grossly normal.  + tremor evident at rest.  Negative romberg, negative pronator drift.  no dysdiadokokinesia.  Normal finger-nose.  Gait v unsteady, no shuffling, no trouble initiating or stopping.  + mild trouble with turning.    Normal getup and go ( ~13 sec). Ok stance with feet side by side, progressively more imbalance as we go to tandem stance. Skin:  Intact without suspicious lesions or rashes.  no bruising on arm/ribs, no ecchymosis   Impression & Recommendations:  Problem # 1:  FALL FROM OTHER SLIPPING TRIPPING OR STUMBLING (ICD-E885.9) send for PT balance training and gait assessment, measurement for ambulatory assistive device.  high risk given on coumadin. Orders: T-2 View CXR (71020TC) Physical Therapy Referral (PT)  Problem # 2:  UNSTEADY GAIT (ICD-781.2) PT  referral.  Recommended stop taking toprol XL two times a day (which pt was doing for tremor), change to short acting tartrate two times a day.  Rec change oxybutynin to shorta cting and two times a day.  Assistive ambulatory device will likely help balance, as well as PT training.  Had home safety eval in 2003 with hip fx per pt/wife, none since. Orders: Physical Therapy Referral (PT)  Problem # 3:  RIB PAIN, LEFT SIDED (ICD-786.50) CXR looking ok, I don't see any evident rib fx, awaiting radiologist's read.  Rec tylenol ES scheduled three times a day for next 5 days to help with pain control.    Orders: T-2 View CXR (71020TC)  Complete Medication List: 1)  Lipitor 40 Mg Tabs (Atorvastatin calcium) .... Take one by mouth daily 2)  Flonase 50 Mcg/act Susp (Fluticasone propionate) .... One sqiurt each nostril two times a day as needed. 3)  Tricor 145 Mg Tabs (Fenofibrate) .... Take one by mouth daily 4)  Valtrex 1 Gm Tabs (Valacyclovir hcl) .... As needed 5)  Aciphex 20 Mg Tbec (Rabeprazole sodium) .... Take one by mouth daily 6)  Coq-10 100 Mg Caps (Coenzyme q10) .... Take one by mouth at bedtime 7)  Effexor 75 Mg Tabs (Venlafaxine hcl) .... One tab by mouth once daily 8)  Warfarin Sodium 5 Mg Tabs (Warfarin sodium) .... As directed 9)  Warfarin Sodium 3 Mg Tabs (Warfarin sodium) .Marland Kitchen.. 1 daily by mouth are as directed 10)  Claritin 10 Mg Tabs (Loratadine) .... One tab by mouth once daily as needed. 11)  Metoprolol Tartrate 25 Mg Tabs (Metoprolol tartrate) .... Half tablet twice daily 12)  Vitamin B-12 Cr 2000 Mcg Cr-tabs (Cyanocobalamin) .Marland Kitchen.. 1 tab under the tongue every other day 13)  Vitamin D 1000 Unit Tabs (Cholecalciferol) .... One tab by mouth two times a day 14)  Oxybutynin Chloride 5 Mg Tabs (Oxybutynin chloride) .... One by mouth two times a day  Patient Instructions: 1)  Change metoprolol prescription to short acting twice daily. 2)  Change oxybutynin prescription to short acting  twice daily, if you notice increasing accidents, you can go back on long acting. 3)  Take tylenol 500mg  three times daily scheduled for next 5 days then as needed for rib pain. 4)  Stop by [facility]'s office for PT referral for gait assessment and need for ambulatory assistance device. 5)  Pleasure to meet you today. Call clinic with questions. Prescriptions: OXYBUTYNIN CHLORIDE 5 MG TABS (OXYBUTYNIN CHLORIDE) one by mouth two times a day  #60 x 3   Entered and Authorized by:   Eustaquio Boyden  MD   Signed by:   Eustaquio Boyden  MD on 04/16/2010   Method used:   Electronically to        [facility] (retail)       [address]       [place], [place]  [id number]       Ph: [phone]       Fax: [phone]   RxID:   [id number] METOPROLOL TARTRATE 25 MG TABS (METOPROLOL TARTRATE) half tablet twice daily  #60 x 3   Entered and Authorized by:   Eustaquio Boyden  MD   Signed by:   Eustaquio Boyden  MD on 04/16/2010   Method used:   Electronically to        [facility] (retail)       [address]       [place], [place]  [id number]       Ph: [phone]       Fax: [phone]   RxID:   [id number]   Current Allergies (reviewed today): ! SULFADIAZINE (SULFADIAZINE) ! PENICILLIN V POTASSIUM (PENICILLIN V POTASSIUM) ! AXID (NIZATIDINE)

## 2010-10-08 NOTE — Procedures (Signed)
Summary: Colon   Colonoscopy  Procedure date:  06/02/2000  Findings:      Location:  [facility].                     [facility]  Patient:    [name], [name]                       MRN: [id number] Proc. Date: 06/02/00 Adm. Date:  [id number] Disc. Date: [id number] Attending:  Sabino Gasser                           Procedure Report  PROCEDURE: Colonoscopy.  ENDOSCOPIST: Sabino Gasser, M.D.  INDICATIONS FOR PROCEDURE: Hemoccult positivity.  ANESTHESIA: None further given.  DESCRIPTION OF PROCEDURE: With the patient mildly sedated and in the left lateral decubitus position, the Olympus videoscopic colonoscope was inserted into the rectum and passed under direct vision to the cecum.  The cecum was identified by the ileocecal valve and appendiceal orifice, both of which were photographed.  Of note, the print was slightly suboptimal in that there was semisolid stool and copious amounts of brownish liquid in the colon, but from this point the colonoscope was slowly withdrawn taking circumferential views of the entire colonic mucosa, stopping to photograph a polyp seen in the descending/sigmoid colon area.  This was removed in the scope and once this was done with snare cautery technique at a setting of 20/20 blended current, the endoscope was removed to the rectum, which appeared normal on direct view and retroflex view.  The colonoscope was then straightened and withdrawn.  The patients vital signs and pulse oximetry remained stable and the patient tolerated the procedure well without apparent complications.  FINDINGS: Polyp, as noted above.  Await biopsy report.  The patient will call for results and follow up with me as an outpatient. DD:  06/02/00 TD:  06/03/00 Job: [id number]

## 2010-10-08 NOTE — Assessment & Plan Note (Signed)
Summary: DIARRHEA X 3 WEEKS   Vital Signs:  Patient profile:   77 year old [name] Weight:      176.25 pounds Temp:     98.4 degrees F oral Pulse rate:   84 / minute Pulse rhythm:   regular BP sitting:   96 / 56  (left arm) Cuff size:   regular  Vitals Entered By: Selena Batten Dance CMA Duncan Dull) (June 06, 2010 11:37 AM) CC: Diarrhea x3 weeks   History of Present Illness: CC: 3wk history of diarrhea  Did have episode of vomiting and diarrhea 2 months ago.  Then 3 wks ago started with diarrhea.  watery with black flakes, no mucous, no blood.  No black tarry stool.  + more malodorous than usual.  No nausea/vomiting, no fevers/chills.  Can't think of anything out of ordinary that would have caused it.  + h/o lactose intolerance but staying away from dairy products.  + sharp abd pain, relieved with stooling.  Using immodium intermittently and cleared about 2 days but then came right back.  On aciphex for ulcers.  feeling very bloated with belching and passing gas.  Last stool yesterday morning.  No recent antibiotic use.  + night time diarrhea x 1, no diarrhea while fasting.  No appetite changes  2 nights ago had chinese food (general chao spicy chicken) and diarrhea started again.  Spicy foods seem to irritate (prior tolerated spicy foods ok).  Also finds irritation with OJ.  Small piece of canteloupe recently (but after diarrhea started).  5 lb loss since last visit.  Using lactaid.  cut back on almond milk.  pt states BPs run low, not dizzy.  No new meds except oxybutynin which he changed from long acting to rapid acting.  Current Medications (verified): 1)  Lipitor 40 Mg  Tabs (Atorvastatin Calcium) .... Take One By Mouth Daily 2)  Flonase 50 Mcg/act  Susp (Fluticasone Propionate) .... One Sqiurt Each Nostril Two Times A Day As Needed. 3)  Tricor 145 Mg  Tabs (Fenofibrate) .... Take One By Mouth Daily 4)  Valtrex 1 Gm  Tabs (Valacyclovir Hcl) .... As Needed 5)  Aciphex 20 Mg  Tbec (Rabeprazole  Sodium) .... Take One By Mouth Daily 6)  Coq-10 100 Mg  Caps (Coenzyme Q10) .... Take One By Mouth At Bedtime 7)  Effexor 75 Mg  Tabs (Venlafaxine Hcl) .... One Tab By Mouth Once Daily 8)  Warfarin Sodium 5 Mg  Tabs (Warfarin Sodium) .... As Directed 9)  Warfarin Sodium 3 Mg Tabs (Warfarin Sodium) .Marland Kitchen.. 1 Daily By Mouth Are As Directed 10)  Claritin 10 Mg Tabs (Loratadine) .... One Tab By Mouth Once Daily As Needed. 11)  Metoprolol Tartrate 25 Mg Tabs (Metoprolol Tartrate) .... Half Tablet Twice Daily 12)  Vitamin B-12 Cr 2000 Mcg Cr-Tabs (Cyanocobalamin) .Marland Kitchen.. 1 Tab Under The Tongue Every Other Day 13)  Vitamin D 1000 Unit Tabs (Cholecalciferol) .... One Tab By Mouth Two Times A Day 14)  Oxybutynin Chloride 5 Mg Tabs (Oxybutynin Chloride) .... One By Mouth Two Times A Day 15)  Prenatal Vitamins 0.8 Mg Tabs (Prenatal Multivit-Min-Fe-Fa) .Marland Kitchen.. 1 By Mouth Once Daily 16)  Imodium A-D 2 Mg Tabs (Loperamide Hcl) .... By Mouth As Directed As Needed  Allergies: 1)  ! Sulfadiazine (Sulfadiazine) 2)  ! Penicillin V Potassium (Penicillin V Potassium) 3)  ! Axid (Nizatidine)  Past History:  Past Medical History: Last updated: 06/03/2010 GERD Coronary artery disease Renal insufficiency Depression h/o PEs and phlebitis Urge incontinence  and prev elevated PSA (that normalized) followed by Dr. Annabell Howells  Past Surgical History: Last updated: 12/03/2009 Tonsillectomy 1965 L Herniorrhaphy 1979 Cystoscopy for Kidney Stones 1992 Explor Lap w/Incidental Appendectomy 1996 CATH 30% one vessel 2000 CT abd & pelvis, scarring of right lung,, stable negative O/W 11/02 CT abd. & pelvis, ? stones, right LL scarring 03/02 V/Q scan negative 10/00 Knee arthroscopy right ([facility]) 03/24/02 [place]  [place] 1998 Right fem. 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 Neck:  No deformities, masses, or tenderness noted.  no bruits Lungs:  Normal respiratory effort, chest expands symmetrically. Lungs are clear to auscultation, no crackles or wheezes. Heart:  Normal rate and regular rhythm. S1 and S2 normal without gallop, murmur, click, rub or other extra sounds. Abdomen:  firm abd R side > L.  Overall nontender x mild tenderness to deep palpation but no obvious masses appreciated.  NABS.  Nondistended.  No rebound, no peritonitis. Rectal:  No external abnormalities noted. Normal sphincter tone. No rectal masses or tenderness. Guaiac neg.  erythema around anus Prostate:  Small raised hemangioma of the right posterior scrotal  wall...benign. Prostate smoothe symmetric without lesions or nodularity, 20-30 gms. Pulses:  2+ periph pulses Extremities:  No clubbing, cyanosis, edema, or deformity noted with normal full range of motion of all joints.     Impression & Recommendations:  Problem # 1:  DIARRHEA (ICD-787.91) check stool culture for infectious cause.  doesn't sound secretory (no diarrhea at night or while fasting).  Abd exam today with firm abd, recheck next visit.  If continued diarrhea, firm abd, consider CT scan with contrast.  check Cr today as well as other labwork to r/o dehydration.  Abd xray today to eval fecal burden.  Hemoccult neg today x1.  colonoscopy 1998 WNL.  Last hemoccults 11/2008.  due for them today, would wait until diarrhea resolved.  if not improving, refer to GI.  RTC 1 wk. His updated medication list for this problem includes:    Imodium A-d 2 Mg Tabs (Loperamide hcl) ..... By mouth as directed as  needed  Orders: T-Culture, Stool ([id number]) T-Culture, C-Diff Toxin A/B ([id number]) TLB-BMP (Basic Metabolic Panel-BMET) (80048-METABOL) TLB-Triglycerides (84478-TRG) TLB-CBC Platelet - w/Differential (85025-CBCD) TLB-Hepatic/Liver Function Pnl (80076-HEPATIC) T-Abdomen 2-view (74020TC) Hemoccult Guaiac-1 spec.(in office) (82270)  Complete Medication List: 1)  Lipitor 40 Mg Tabs (Atorvastatin calcium) .... Take one by mouth daily 2)  Flonase 50 Mcg/act Susp (Fluticasone propionate) .... One sqiurt each nostril two times a day as needed. 3)  Tricor 145 Mg Tabs (Fenofibrate) .... Take one by mouth daily 4)  Valtrex 1 Gm Tabs (Valacyclovir hcl) .... As needed 5)  Aciphex 20 Mg Tbec (Rabeprazole sodium) .... Take one by mouth daily 6)  Coq-10 100 Mg Caps (Coenzyme q10) .... Take one by mouth at bedtime 7)  Effexor 75 Mg Tabs (Venlafaxine hcl) .... One tab by mouth once daily 8)  Warfarin Sodium 5 Mg Tabs (Warfarin sodium) .... As directed 9)  Warfarin Sodium 3 Mg Tabs (Warfarin sodium) .Marland Kitchen.. 1 daily by mouth are as directed 10)  Claritin 10 Mg Tabs (Loratadine) .... One tab by mouth once daily as needed. 11)  Metoprolol Tartrate 25 Mg Tabs (Metoprolol tartrate) .... Half tablet twice daily 12)  Vitamin B-12 Cr 2000 Mcg Cr-tabs (Cyanocobalamin) .Marland Kitchen.. 1 tab under the tongue every other day 13)  Vitamin D 1000 Unit Tabs (Cholecalciferol) .... One tab by mouth two times a day 14)  Oxybutynin Chloride 5 Mg Tabs (Oxybutynin chloride) .... One by mouth two times a day 15)  Prenatal Vitamins 0.8 Mg Tabs (Prenatal multivit-min-fe-fa) .Marland Kitchen.. 1 by mouth once daily 16)  Imodium A-d 2 Mg Tabs (Loperamide hcl) .... By mouth as directed as needed  Patient Instructions: 1)  Return in 1 weeks for follow up. 2)  Xray today, we may talk about getting CT scan if no better. 3)  Bring stool sample for evaluation (culture and c.diff) 4)  Push fluids to help prevent dehydration. 5)  Continue immodium  for now. 6)  Good to see you today. 7)  Guaiac today negative.  Current Allergies (reviewed today): ! SULFADIAZINE (SULFADIAZINE) ! PENICILLIN V POTASSIUM (PENICILLIN V POTASSIUM) ! AXID (NIZATIDINE)   Prevention & Chronic Care Immunizations   Influenza vaccine: Fluvax 3+  (09/[name]/2011)    Tetanus booster: 03/02/2002: Td    Pneumococcal vaccine: Not documented    H. zoster vaccine: 08/22/2005: Zostavax  Colorectal Screening   Hemoccult: negative  (11/03/2008)    Colonoscopy: Not documented  Other Screening   PSA: 7.89  (11/26/2009)   Smoking status: never  (12/03/2009)  Lipids   Total Cholesterol: 141  (11/26/2009)   LDL: 92  (09/22/2008)   LDL Direct: 82.6  (11/26/2009)   HDL: 25.40  (11/26/2009)   Triglycerides: 251.0  (11/26/2009)    SGOT (AST): 56  (11/26/2009)   SGPT (ALT): 49  (11/26/2009)   Alkaline phosphatase: 34  (11/26/2009)   Total bilirubin: 0.9  (11/26/2009)  Hypertension   Last Blood Pressure: 96 / 56  (06/06/2010)   Serum creatinine: 1.6  (11/26/2009)   Serum potassium 4.8  (11/26/2009)  Self-Management Support :    Hypertension self-management support: Not documented    Lipid self-management support: Not documented

## 2010-10-08 NOTE — Letter (Signed)
Summary: Anticoagulation/[facility]  Anticoagulation/[facility]   Imported By: Sherian Rein 07/26/2010 14:29:38  _____________________________________________________________________  External Attachment:    Type:   Image     Comment:   External Document

## 2010-10-10 NOTE — Assessment & Plan Note (Signed)
Summary: cough, congestion, sob/alc INR 2.3   Vital Signs:  Patient profile:   77 year old male Weight:      173.75 pounds O2 Sat:      93 % on Room air Temp:     100.5 degrees F oral Pulse rate:   88 / minute Pulse rhythm:   regular BP sitting:   118 / 60  (left arm) Cuff size:   regular  Vitals Entered By: Selena Batten Zidan Helget CMA (AAMA) (September 05, 2010 2:06 PM)  O2 Flow:  Room air CC: Cough/Congestion/fever   History of Present Illness: CC: cough/congestion  4d h/o productive cough of pink phlegm, congestion, fever (today 100.5), Started with ST.  Mucinex D seems to break up cough.  + SOB.  mainly congestion and coughing up mucous that bothers him.  + leg cramping R>L.  feeling more weak than normal as well as worsening of tremors.  no sick contacts.  no smokers at home.  No abd pain, n/v, wheezing.  No rashes.  No myalgias/arthralgias.  Diarrhea better.  only taking immodium as needed.  per patient and wife, h/o asthma but off all inhalers.  not in chart, only allergic rhinitis/RAD in chart.  Current Medications (verified): 1)  Lipitor 40 Mg  Tabs (Atorvastatin Calcium) .... Take One By Mouth Daily 2)  Flonase 50 Mcg/act  Susp (Fluticasone Propionate) .... One Sqiurt Each Nostril Two Times A Day As Needed. 3)  Tricor 145 Mg  Tabs (Fenofibrate) .... Take One By Mouth Daily 4)  Valtrex 1 Gm  Tabs (Valacyclovir Hcl) .... As Needed 5)  Aciphex 20 Mg  Tbec (Rabeprazole Sodium) .... Take One By Mouth Daily 6)  Coq-10 100 Mg  Caps (Coenzyme Q10) .... Take One By Mouth At Bedtime 7)  Effexor 75 Mg  Tabs (Venlafaxine Hcl) .... One Tab By Mouth Once Daily 8)  Warfarin Sodium 5 Mg  Tabs (Warfarin Sodium) .... As Directed 9)  Warfarin Sodium 3 Mg Tabs (Warfarin Sodium) .Marland Kitchen.. 1 Daily By Mouth Are As Directed 10)  Metoprolol Tartrate 25 Mg Tabs (Metoprolol Tartrate) .... Half Tablet Twice Daily 11)  Vitamin B-12 Cr 2000 Mcg Cr-Tabs (Cyanocobalamin) .Marland Kitchen.. 1 Tab Under The Tongue Every Other  Day 12)  Vitamin D 1000 Unit Tabs (Cholecalciferol) .... One Tab By Mouth Two Times A Day 13)  Oxybutynin Chloride 5 Mg Tabs (Oxybutynin Chloride) .... One By Mouth Two Times A Day 14)  Prenatal Vitamins 0.8 Mg Tabs (Prenatal Multivit-Min-Fe-Fa) .Marland Kitchen.. 1 By Mouth Once Daily 15)  Imodium A-D 2 Mg Tabs (Loperamide Hcl) .... By Mouth As Directed As Needed 16)  Zofran 4 Mg Tabs (Ondansetron Hcl) .... One By Mouth Q4 Hours As Needed Nausea  Allergies: 1)  ! Sulfadiazine (Sulfadiazine) 2)  ! Penicillin V Potassium (Penicillin V Potassium) 3)  ! 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 Annabell Howells, rec no rpt unless urinary sxs    Review of Systems       per HPI  Physical Exam  General:  Well-developed,well-nourished,in no acute distress; alert,appropriate and cooperative throughout examination, mildly obese.  weaker than previous occasions Head:  Normocephalic and atraumatic without obvious abnormalities. No apparent alopecia or balding.  no sinus tenderness Eyes:  No corneal or conjunctival inflammation noted. EOMI. Perrla. Ears:  TMs clera bilaterally Nose:  nares clear, some irritation bilaterally Mouth:  MMM, no pharyngeal exudates.  + erythematous and irritated Neck:  No deformities, masses, or tenderness noted.  no bruits Lungs:  slight increased WOB - heavy breathing, but no intercostal retractions, no nasal flaring.  good air movement.  no wheezing. Heart:  Normal rate and regular rhythm. S1 and S2 normal without gallop, murmur, click, rub or other extra sounds. Pulses:  2+ rad pulses, brisk cap refill Extremities:   no cyanosis   Impression & Recommendations:  Problem # 1:  COUGH (ICD-786.2) with decreasd O2 to 93%.  treat aggressively given comorbidities with zpack as well as for presumed bronchitis.  given previosly on inhaler and did help some, refilled albuterol, advised to use pretty regularly for next 1-2 days then minimize use.  my preliminary read CXR - no acute infiltrate.  + R hilar nodules, don't seem changed from prior CXR.  will call if any change in plan based on radiology read.  (some issues today with canopy)  h/o PE but today looks more consistent with upper respiratory infection.  Orders: T-2 View CXR (71020TC)  Problem # 2:  DEEP VENOUS THROMBOPHLEBITIS, HX OF ON CHRONIC COUMADIN (ICD-V12.52) takes 3mg  daily.  advised to alternate 3 and 2.5mg  for next 4 days as starting azithro.  His updated medication list for this problem includes:    Warfarin Sodium 5 Mg Tabs (Warfarin sodium) .Marland Kitchen... As directed    Warfarin Sodium 3 Mg Tabs (Warfarin sodium) .Marland Kitchen... 1 daily by mouth are as directed  Complete Medication List: 1)  Lipitor 40 Mg Tabs (Atorvastatin calcium) .... Take one by mouth daily 2)  Flonase 50 Mcg/act Susp (Fluticasone propionate) .... One sqiurt each nostril two times a day as needed. 3)  Tricor 145 Mg Tabs (Fenofibrate) .... Take one by mouth daily 4)  Valtrex 1 Gm Tabs (Valacyclovir hcl) .... As needed 5)  Aciphex 20 Mg Tbec (Rabeprazole sodium) .... Take one by mouth daily 6)  Coq-10 100 Mg Caps (Coenzyme q10) .... Take one by mouth at bedtime 7)  Effexor 75 Mg Tabs (Venlafaxine hcl) .... One tab by mouth once daily 8)  Warfarin Sodium 5 Mg Tabs (Warfarin sodium) .... As directed 9)  Warfarin Sodium 3 Mg Tabs (Warfarin sodium) .Marland Kitchen.. 1 daily by mouth are as directed 10)  Metoprolol Tartrate 25 Mg Tabs (Metoprolol tartrate) .... Half tablet twice daily 11)  Vitamin B-12 Cr 2000 Mcg Cr-tabs (Cyanocobalamin) .Marland Kitchen.. 1 tab under the tongue every other day 12)  Vitamin D 1000  Unit Tabs (Cholecalciferol) .... One tab by mouth two times a day 13)  Oxybutynin Chloride 5 Mg Tabs (Oxybutynin chloride) .... One by mouth two times a day 14)  Prenatal Vitamins 0.8 Mg Tabs (Prenatal multivit-min-fe-fa) .Marland Kitchen.. 1 by mouth once daily 15)  Imodium A-d 2 Mg Tabs (Loperamide hcl) .... By mouth as directed as needed 16)  Zithromax Z-pak 250 Mg Tabs (Azithromycin) .... Use as directed 17)  Cheratussin Ac 100-10 Mg/97ml Syrp (Guaifenesin-codeine) .... One teaspoon at bedtime as needed cough 18)  Ventolin Hfa 108 (90 Base) Mcg/act Aers (Albuterol sulfate) .... 2 puffs q4-6 hours as needed cough/sob  Other Orders: Protime ([id number])  Patient Instructions: 1)  This is likely just viral infection, however given you did have slightly lower oxygen than I'd like to see, have sent in zpack to take. 2)  Please let  us know if not better tomorrow. 3)  Continue to drink plenty of fluid as well as get plenty of rest. 4)  may try delsym or robitussin (without decongestant) at night to help rest, but continue mucinex during day (would prefer simple mucinex, not D).  Take with plenty of water to mobilize mucous out.  If robitussin/delsym not helping, may try cheratussin. 5)  Call clinic with questions. 6)  Tylenol for soreness/fever. Prescriptions: VENTOLIN HFA 108 (90 BASE) MCG/ACT AERS (ALBUTEROL SULFATE) 2 puffs q4-6 hours as needed cough/sob  #1 x 1   Entered and Authorized by:   Eustaquio Boyden  MD   Signed by:   Eustaquio Boyden  MD on 09/05/2010   Method used:   Electronically to        [facility] (retail)       [address]       [place], [place]  [id number]       Ph: [phone]       Fax: [phone]   RxID:   [id number] CHERATUSSIN AC 100-10 MG/5ML SYRP (GUAIFENESIN-CODEINE) one teaspoon at bedtime as needed cough  #100cc x 0   Entered and Authorized by:   Eustaquio Boyden  MD   Signed by:   Eustaquio Boyden  MD on 09/05/2010   Method  used:   Print then Give to Patient   RxID:   [id number] Christena Deem Z-PAK 250 MG TABS (AZITHROMYCIN) use as directed  #1 x 0   Entered and Authorized by:   Eustaquio Boyden  MD   Signed by:   Eustaquio Boyden  MD on 09/05/2010   Method used:   Electronically to        [facility] (retail)       [address]       [place], [place]  [id number]       Ph: [phone]       Fax: [phone]   RxID:   [id number]    Orders Added: 1)  Est. Patient Level IV [35009] 2)  T-2 View CXR [71020TC] 3)  Protime [38182XH]    Current Allergies (reviewed today): ! SULFADIAZINE (SULFADIAZINE) ! PENICILLIN V POTASSIUM (PENICILLIN V POTASSIUM) ! AXID (NIZATIDINE)  Laboratory Results   Blood Tests   Date/Time Recieved: September 05, 2010 3:45 PM  Date/Time Reported: September 05, 2010 3:45 PM   PT: 27.4 s   (Normal Range: 10.6-13.4)  INR: 2.3   (Normal Range: 0.88-1.12   Therap INR: 2.0-3.5)      ANTICOAGULATION RECORD PREVIOUS REGIMEN & LAB RESULTS Anticoagulation Diagnosis:  Deep venous thrombosis on  10/01/2009 Previous INR Goal Range:  2.5-3.5 on  10/01/2009 Previous INR:  2.7 on  08/26/2010 Previous Coumadin Dose(mg):  3 mg daily on  05/20/2010 Previous Regimen:  3mg  qd on  08/26/2010 Previous Coagulation Comments:  . on  12/10/2009  NEW REGIMEN & LAB RESULTS Current INR: 2.3 Regimen: 2.5/3.0/2.5/3.0 then resume 3mg  qd  Provider: Sharen Hones      Repeat testing in: 2 weeks  MEDICATIONS LIPITOR 40 MG  TABS (ATORVASTATIN CALCIUM) Take one by mouth daily FLONASE 50 MCG/ACT  SUSP (FLUTICASONE PROPIONATE) one sqiurt each nostril two times a day as needed. TRICOR 145 MG  TABS (FENOFIBRATE) Take one by mouth daily VALTREX 1 GM  TABS (VALACYCLOVIR HCL) As needed ACIPHEX 20 MG  TBEC (RABEPRAZOLE SODIUM) Take one by mouth daily COQ-10 100 MG  CAPS (COENZYME Q10) Take one by mouth at bedtime EFFEXOR 75 MG  TABS (VENLAFAXINE HCL) one tab  by mouth once daily WARFARIN SODIUM 5 MG  TABS (WARFARIN SODIUM) as directed WARFARIN SODIUM 3 MG TABS (WARFARIN SODIUM) 1 DAILY BY MOUTH ARE AS DIRECTED METOPROLOL TARTRATE 25 MG TABS (METOPROLOL TARTRATE) half tablet twice daily VITAMIN B-12 CR 2000 MCG CR-TABS (CYANOCOBALAMIN) 1 tab under the tongue every other day VITAMIN D 1000 UNIT TABS (CHOLECALCIFEROL) one tab by mouth two times a day OXYBUTYNIN CHLORIDE 5 MG TABS (OXYBUTYNIN CHLORIDE) one by mouth two times a day PRENATAL VITAMINS 0.8 MG TABS (PRENATAL MULTIVIT-MIN-FE-FA) 1 by mouth once daily IMODIUM A-D 2 MG TABS (LOPERAMIDE HCL) by mouth as directed as needed ZITHROMAX Z-PAK 250 MG TABS (AZITHROMYCIN) use as directed CHERATUSSIN AC 100-10 MG/5ML SYRP (GUAIFENESIN-CODEINE) one teaspoon at bedtime as needed cough VENTOLIN HFA 108 (90 BASE) MCG/ACT AERS (ALBUTEROL SULFATE) 2 puffs q4-6 hours as needed cough/sob  Dose has been reviewed with patient or caretaker during this visit.  Reviewed by: Allison Quarry  Anticoagulation Visit Questionnaire      Coumadin dose missed/changed:  No      Abnormal Bleeding Symptoms:  No Any diet changes including alcohol intake, vegetables or greens since the last visit:  No Any illnesses or hospitalizations since the last visit:  Yes      Recent Illness/Hospitalizations:  will start antibiotics today. Any signs of clotting since the last visit (including chest discomfort, dizziness, shortness of breath, arm tingling, slurred speech, swelling or redness in leg):  No

## 2010-10-21 ENCOUNTER — Ambulatory Visit (INDEPENDENT_AMBULATORY_CARE_PROVIDER_SITE_OTHER): Payer: Medicare Other

## 2010-10-21 ENCOUNTER — Encounter: Payer: Self-pay | Admitting: Family Medicine

## 2010-10-21 DIAGNOSIS — Z7901 Long term (current) use of anticoagulants: Secondary | ICD-10-CM

## 2010-10-21 DIAGNOSIS — T81718A Complication of other artery following a procedure, not elsewhere classified, initial encounter: Secondary | ICD-10-CM

## 2010-10-21 DIAGNOSIS — Z5181 Encounter for therapeutic drug level monitoring: Secondary | ICD-10-CM

## 2010-10-21 LAB — CONVERTED CEMR LAB: Prothrombin Time: 30 s

## 2010-10-24 ENCOUNTER — Emergency Department (HOSPITAL_COMMUNITY): Payer: Medicare Other

## 2010-10-24 ENCOUNTER — Observation Stay (HOSPITAL_COMMUNITY)
Admission: EM | Admit: 2010-10-24 | Discharge: 2010-10-25 | Disposition: A | Discharge status: Home / Self Care | Payer: Medicare Other | Source: Ambulatory Visit | Attending: Internal Medicine | Admitting: Internal Medicine

## 2010-10-24 DIAGNOSIS — IMO0002 Reserved for concepts with insufficient information to code with codable children: Principal | ICD-10-CM | POA: Insufficient documentation

## 2010-10-24 DIAGNOSIS — R51 Headache: Secondary | ICD-10-CM | POA: Insufficient documentation

## 2010-10-24 DIAGNOSIS — R079 Chest pain, unspecified: Secondary | ICD-10-CM | POA: Insufficient documentation

## 2010-10-24 DIAGNOSIS — M79609 Pain in unspecified limb: Secondary | ICD-10-CM | POA: Insufficient documentation

## 2010-10-24 DIAGNOSIS — Z7901 Long term (current) use of anticoagulants: Secondary | ICD-10-CM | POA: Insufficient documentation

## 2010-10-24 DIAGNOSIS — Y92009 Unspecified place in unspecified non-institutional (private) residence as the place of occurrence of the external cause: Secondary | ICD-10-CM | POA: Insufficient documentation

## 2010-10-24 DIAGNOSIS — K219 Gastro-esophageal reflux disease without esophagitis: Secondary | ICD-10-CM | POA: Insufficient documentation

## 2010-10-24 DIAGNOSIS — W010XXA Fall on same level from slipping, tripping and stumbling without subsequent striking against object, initial encounter: Secondary | ICD-10-CM | POA: Insufficient documentation

## 2010-10-24 DIAGNOSIS — E785 Hyperlipidemia, unspecified: Secondary | ICD-10-CM | POA: Insufficient documentation

## 2010-10-24 DIAGNOSIS — Z86718 Personal history of other venous thrombosis and embolism: Secondary | ICD-10-CM | POA: Insufficient documentation

## 2010-10-24 LAB — CBC
MCH: 34.9 pg — ABNORMAL HIGH (ref 26.0–34.0)
MCHC: 33.9 g/dL (ref 30.0–36.0)
Platelets: 118 10*3/uL — ABNORMAL LOW (ref 150–400)
RBC: 3.55 MIL/uL — ABNORMAL LOW (ref 4.22–5.81)

## 2010-10-24 LAB — BASIC METABOLIC PANEL
BUN: 23 mg/dL (ref 6–23)
CO2: 27 mEq/L (ref 19–32)
Chloride: 107 mEq/L (ref 96–112)
Creatinine, Ser: 1.45 mg/dL (ref 0.4–1.5)

## 2010-10-24 LAB — DIFFERENTIAL
Basophils Relative: 0 % (ref 0–1)
Eosinophils Absolute: 0.1 10*3/uL (ref 0.0–0.7)
Monocytes Relative: 10 % (ref 3–12)
Neutrophils Relative %: 59 % (ref 43–77)

## 2010-10-24 LAB — CK TOTAL AND CKMB (NOT AT ARMC)
CK, MB: 1.7 ng/mL (ref 0.3–4.0)
Relative Index: INVALID (ref 0.0–2.5)
Total CK: 62 U/L (ref 7–232)

## 2010-10-24 LAB — TROPONIN I: Troponin I: <0.01 ng/mL (ref 0.00–0.06)

## 2010-10-24 LAB — POCT CARDIAC MARKERS: Troponin i, poc: <0.05 ng/mL (ref 0.00–0.09)

## 2010-10-24 LAB — PROTIME-INR: INR: 2.53 — ABNORMAL HIGH (ref 0.00–1.49)

## 2010-10-24 NOTE — H&P (Signed)
[name], [name]              ACCOUNT NO.:  [id number]  MEDICAL RECORD NO.:  [id number]           PATIENT TYPE:  E  LOCATION:  [facility]                         FACILITY:  [facility]  PHYSICIAN:  Talmage Nap, MD  DATE OF BIRTH:  [birth year]  DATE OF ADMISSION:  10/24/2010 DATE OF DISCHARGE:                             HISTORY & PHYSICAL   PRIMARY CARE PHYSICIAN:  Unassigned.  History obtainable from the patient and the patient's spouse.  CHIEF COMPLAINT:  Accidental fall at about 7:30 a.m. today while the patient was walking his dog.  The patient is a 77 year old Caucasian male very pleasant with history of pulmonary embolism on anticoagulation with Coumadin, who was apparently walking his dog today and accidentally tripped and fell and sustained abrasion on the left side of his face. He denied any premonitory symptoms prior to the onset of fall.  He denied any chest pain, denied any shortness of breath.  He denied any history of loss of consciousness.  He also denied any systemic symptoms, i.e. no fever, no chills, no rigor.  Post-fall, the patient was having slight difficulty trying to ambulate and subsequently was brought to the emergency room to be evaluated.  PAST MEDICAL HISTORY:  Positive for: 1. Pulmonary embolism on anticoagulation with Coumadin. 2. Hyperlipidemia. 3. History of gallstone. 4. History of a kidney stone.  PAST SURGICAL HISTORY: 1. Right hip fracture status post open reduction internal fixation. 2. Kidney stone status post cystoscopy. 3. Left hernia repair. 4. Adenoidectomy and tonsillectomy.  Preadmission med without dosages include: 1. Coumadin. 2. Lipitor. 3. Oxybutynin. 4. TriCor. 5. Venlafaxine. 6. CoQ10. 7. Aciphex. 8. Vitamin B12. 9. Liver oil. 10.Vitamin D oral.  ALLERGIES:  To PENICILLIN, SULFA, and AXID.  SOCIAL HISTORY:  Negative for alcohol, tobacco use.  The patient is retired.  FAMILY HISTORY:  York Spaniel to be positive for  hypertension.  REVIEW OF SYSTEMS:  The patient denies any history of headaches.  No blurred vision.  No nausea or vomiting.  No fever.  No chills.  No rigor.  No chest pain or shortness of breath.  Denies any cough.  No abdominal discomfort.  No diarrhea or hematochezia.  No dysuria or hematuria.  No swelling of the lower extremities.  No intolerance to heat or cold.  No neuropsychiatric disorder.  PHYSICAL EXAMINATION:  GENERAL:  Very pleasant man, well hydrated, not in any acute respiratory distress.  Had slight abrasion on the left side of the face. VITAL SIGNS:  Blood pressure is 112/61, pulse is 58, respiratory rate is 20, temperature is 98.2. HEENT:  Pupils are reactive to light and extraocular muscles are intact. NECK:  No jugular venous distention.  No carotid bruit.  No lymphadenopathy. CHEST:  Clear to auscultation. CARDIAC:  Heart sounds are 1 and 2. ABDOMEN:  Soft, nontender.  Liver, spleen, and kidney are not palpable. Bowel sounds are positive. EXTREMITIES:  No pedal edema. NEUROLOGIC:  Nonfocal. MUSCULOSKELETAL SYSTEM:  Shows arthritic changes in the knees and in the feet. NEUROPSYCHIATRIC EVALUATION:  Unremarkable.  LAB DATA:  First set of cardiac markers, troponin I less than 0.05. Chemistry showed sodium of 138,  potassium of 4.6, chloride of 107 with a bicarb of 27, glucose is 95, BUN is 23, creatinine is 1.45. Hematological indices showed WBC count of 5.4, hemoglobin 12.4, hematocrit 36.6, MCV 102.2, with a platelet count of 118, normal differentials.  Coagulation profile showed PT 37.4, INR 2.53.  EKG done showed normal sinus rhythm, no acute ST-wave change noted.  IMAGING STUDIES DONE:  Include CT of the head without contrast showed cerebral atrophy, no fracture seen.  A CT of the maxillofacial without contrast did not show any facial bone fracture.  Chest x-ray was essentially normal.  An x-ray of the left hand showed mild degenerative changes,no fractures  seen  IMPRESSION: 1. Accidental fall with slight abrasion on the left side of the face     (no fracture seen) 2. History of pulmonary embolism, on anticoagulation with Coumadin. 3. Hyperlipidemia. 4. Gastroesophageal reflux disease. 5. History of kidney and gallstones.  PLAN:  To admit the patient to general medical floor for observation. The patient will be saline locked.  He will be on aspirin 81 mg p.o. daily, Coumadin dosing to be done by pharmacy, Lipitor 20 mg p.o. daily, Protonix 40 mg p.o.daily for GI prophylaxis, and Lovenox 40 mg subcu q.24  for deep vein thrombosis prophylaxis.  Further labs to be ordered on this patient will include cardiac enzymes q.6 x3.Patient will also be given his home meds. Finally if cardiac enzymes are negative, the patient will be discharged home.     Talmage Nap, MD     CN/MEDQ  D:  10/24/2010  T:  10/24/2010  Job:  [id number]  Electronically Signed by Talmage Nap  on 10/24/2010 09:49:48 PM

## 2010-10-25 LAB — MRSA PCR SCREENING: MRSA by PCR: NEGATIVE

## 2010-10-25 LAB — CARDIAC PANEL(CRET KIN+CKTOT+MB+TROPI)
CK, MB: 1.6 ng/mL (ref 0.3–4.0)
Relative Index: INVALID (ref 0.0–2.5)
Troponin I: <0.01 ng/mL (ref 0.00–0.06)

## 2010-11-02 NOTE — Discharge Summary (Signed)
NAMEAARAV, [name] NO.:  [id number]  MEDICAL RECORD NO.:  [id number]           PATIENT TYPE:  I  LOCATION:  5005                         FACILITY:  [facility]  PHYSICIAN:  Isidor Holts, M.D.  DATE OF BIRTH:  1931/06/28  DATE OF ADMISSION:  10/24/2010 DATE OF DISCHARGE:  10/25/2010                              DISCHARGE SUMMARY   PRIMARY MD:  [name] Boyden, MD at [facility]  DISCHARGE DIAGNOSES: 1. Status post mechanical fall, with left malar facial abrasion. 2. Chest pain with no acute coronary syndrome. 3. History of pulmonary embolism on anticoagulation. 4. History of cholelithiasis. 5. History of urolithiasis. 6. Dyslipidemia. 7. Gastroesophageal reflux disease.  DISCHARGE MEDICATIONS: 1. Aciphex 20 mg p.o. every evening. 2. Coenzyme Q10 one tablet p.o. daily. 3. Coumadin per INR currently on 3 mg p.o. in the evenings daily. 4. Lipitor 40 mg p.o. daily. 5. Oxybutynin XL 5 mg p.o. b.i.d. 6. Toprol-XL 12.5 mg p.o. b.i.d. 7. TriCor 145 mg p.o. every evening. 8. Venlafaxine 75 mg p.o. every evening. 9.Vitamin B12 OTC 1 tablet p.o. daily. 10.Vitamin D OTC 1 tablet p.o. daily.  PROCEDURES: 1. X-ray left hand October 24, 2010, this showed no acute bony     findings of mild degenerative changes. 2. Chest x-ray October 24, 2010, this showed no acute cardiopulmonary     findings.  There were mild chronic lung changes. 3. Head and maxillofacial CT scan October 24, 2010, this showed no     acute intracranial findings.  No skull fracture and no acute facial     bony fractures.  ADMISSION HISTORY:  As in H and P notes of October 24, 2010, dictated by Dr. Talmage Nap.  However, in brief, this is a 77 year old male, with known history of pulmonary embolism on chronic anticoagulation, dyslipidemia, cholelithiasis, urolithiasis, status post ORIF, right hip fracture, status post cystoscopy for urolithiasis, status post left hernia  repair, postop adenectomy and tonsillectomy presenting following a fall, after he tripped while walking along a hall on October 24, 2010.  He fell on his left side, sustained an abrasion in the left malar region and a bruise to the left hand, soon after he developed central chest pain which was transient.  He presented to the emergency department and was admitted for further evaluation, investigation and management.  CLINICAL COURSE: 1. Fall.  This was mechanical.  Fortunately, the patient sustained no     bony injuries.  He did have a bruise in the left malar region with     a small subcutaneous hematoma and a superficial abrasion.  It is     anticipated that this will subside without incident.  2. Chest pain.  This was transient.  A 12-lead EKG was unremarkable     for acute ischemic changes.  Cardiac enzymes were cycled and     remained unelevated.  As of October 25, 2010, there was no     recurrence of chest pain.  The patient on the basis of age, gender,     and history of dyslipidemia, does indeed have risk factors for     coronary artery  disease.  We have, therefore, arranged for him to     have an outpatient stress Myoview with [facility] and the     patient is agreeable to this plan.  3. History of pulmonary embolism.  The patient is on anticoagulation.     His INR as of October 25, 2010, was 2.44.  4. Dyslipidemia.  The patient continues on statin treatment.  5. Cholelithiasis/urolithiasis.  The patient was asymptomatic from     this viewpoint.  6. GERD.  There were no symptoms referable to this.  The patient     continues on proton pump inhibitor preadmission dosage.  DISPOSITION:  The patient was on February, 17, 2012, considered clinically stable for discharge.  He was, therefore, discharged accordingly.  ACTIVITY:  As tolerated.  FOLLOWUP INSTRUCTIONS:  The patient is to follow up routinely with his primary MD, Dr. Sharen Hones at [facility].   He will also follow up with [facility] for stress Myoview.  SPECIAL INSTRUCTIONS:  The patient will be contacted by the [facility] Cardiology office at this time and date for stress test.  Also, the patient is expected to follow up with his primary MD per his usual schedule, for management of his Coumadin anticoagulation.     Isidor Holts, M.D.     CO/MEDQ  D:  10/25/2010  T:  10/26/2010  Job:  [id number]  cc:   [name] Boyden, MD [facility]  Electronically Signed by Isidor Holts M.D. on 11/02/2010 01:14:53 PM

## 2010-11-13 ENCOUNTER — Encounter: Payer: Self-pay | Admitting: Family Medicine

## 2010-11-13 ENCOUNTER — Telehealth: Payer: Self-pay | Admitting: Family Medicine

## 2010-11-13 ENCOUNTER — Ambulatory Visit (INDEPENDENT_AMBULATORY_CARE_PROVIDER_SITE_OTHER): Payer: Medicare Other | Admitting: Family Medicine

## 2010-11-13 DIAGNOSIS — IMO0002 Reserved for concepts with insufficient information to code with codable children: Secondary | ICD-10-CM

## 2010-11-13 DIAGNOSIS — J069 Acute upper respiratory infection, unspecified: Secondary | ICD-10-CM

## 2010-11-13 DIAGNOSIS — R109 Unspecified abdominal pain: Secondary | ICD-10-CM

## 2010-11-18 ENCOUNTER — Encounter: Payer: Self-pay | Admitting: Family Medicine

## 2010-11-18 ENCOUNTER — Ambulatory Visit: Payer: Medicare Other

## 2010-11-18 DIAGNOSIS — I82409 Acute embolism and thrombosis of unspecified deep veins of unspecified lower extremity: Secondary | ICD-10-CM

## 2010-11-19 NOTE — Progress Notes (Signed)
Summary: flexeril   Phone Note Call from Patient   Caller: Patient Call For: Dr. Hetty Ely  Summary of Call: Patient thought that he was going to have rx for flexeril called in to [facility]. Do I need to call it in? Please advise.  Initial call taken by: Melody Comas,  November 13, 2010 1:10 PM    New/Updated Medications: FLEXERIL 10 MG TABS (CYCLOBENZAPRINE HCL) 1/2 - 1 tab by mouth three times a day as needed for muscle pasin/spasm Prescriptions: FLEXERIL 10 MG TABS (CYCLOBENZAPRINE HCL) 1/2 - 1 tab by mouth three times a day as needed for muscle pasin/spasm  #50 x 1   Entered and Authorized by:   Shaune Leeks MD   Signed by:   Shaune Leeks MD on 11/13/2010   Method used:   Electronically to        [facility] (retail)       [address]       [place], [place]  [id number]       Ph: [phone]       Fax: [phone]   RxID:   [id number]  I called it in as well. Shaune Leeks MD  November 13, 2010 1:41 PM

## 2010-11-19 NOTE — Assessment & Plan Note (Signed)
Summary: ? UTI   Vital Signs:  Patient profile:   77 year old male Weight:      171.75 pounds Temp:     98.5 degrees F oral Pulse rate:   84 / minute Pulse rhythm:   regular BP sitting:   104 / 60  (left arm) Cuff size:   large  Vitals Entered By: Sydell Axon LPN (November 12, 1608 10:35 AM) CC: ? UTI, pain in kidney area, has a cold with a cough   History of Present Illness: Pt here with wife for congestion and achiness since last Thu, approx 6 days. He also has pain in his flank, 8/10 with no blood seen in the urine. He has been coughing a lot, significantly while walking the dog. He had fallen a few weeks ago and was in the hosp overnight for observation with no findings.  He has had multiple Kidney stones in the past. He has had headcahe, better today, severe cough, resonably nonproductive, minimal facial pressure, no fever.   Problems Prior to Update: 1)  Cough  (ICD-786.2) 2)  Diarrhea  (ICD-787.91) 3)  Unsteady Gait  (ICD-781.2) 4)  Psa, Increased  (ICD-790.93) 5)  Special Screening Malig Neoplasms Other Sites  (ICD-V76.49) 6)  Unspecified Vitamin D Deficiency  (ICD-268.9) 7)  Vitamin B12 Deficiency  (ICD-266.2) 8)  Tremor  (ICD-781.0) 9)  Hip Pain, Left  (ICD-719.45) 10)  Dry Skin  (ICD-701.1) 11)  Other Malaise and Fatigue  (ICD-780.79) 12)  Low Back Pain, Acute  (ICD-724.2) 13)  Special Screening Malignant Neoplasm of Prostate  (ICD-V76.44) 14)  Hyperglycemia  (ICD-790.29) 15)  Hypertension, Benign Essential  (ICD-401.1) 16)  Contact Dermatitis Due To Poison Oak  (ICD-692.6) 17)  Depression, Recurrent 09/2003  (ICD-311) 18)  Incontinence, Urge  (ICD-788.31) 19)  Renal Insufficiency, Mild  (ICD-588.9) 20)  Coronary Artery Disease  (ICD-414.00) 21)  Renal Calculus, Hx of  (ICD-V13.01) 22)  Deep Venous Thrombophlebitis, Hx of On Chronic Coumadin  (ICD-V12.52) 23)  Allergic Rhinitis, Allergies/ Rad  (ICD-477.9) 24)  Hsv  (ICD-054.9) 25)  Hypercholesterolemia/ Trig  Elevated  (ICD-272.0) 26)  Gerd, H/o Peptic Ulcers  (ICD-530.81) 27)  Thrombocytopenia, Primary Nos  (ICD-287.30) 28)  Encounter For Therapeutic Drug Monitoring  (ICD-V58.83) 29)  Embolism & Infarction, Iatrogenic Pulmonary  (ICD-415.11) 30)  Aftercare, Long-term Use, Anticoagulants  (ICD-V58.61) 31)  Thrombophlebitis  (ICD-451.9)  Medications Prior to Update: 1)  Lipitor 40 Mg  Tabs (Atorvastatin Calcium) .... Take One By Mouth Daily 2)  Flonase 50 Mcg/act  Susp (Fluticasone Propionate) .... One Sqiurt Each Nostril Two Times A Day As Needed. 3)  Tricor 145 Mg  Tabs (Fenofibrate) .... Take One By Mouth Daily 4)  Valtrex 1 Gm  Tabs (Valacyclovir Hcl) .... As Needed 5)  Aciphex 20 Mg  Tbec (Rabeprazole Sodium) .... Take One By Mouth Daily 6)  Coq-10 100 Mg  Caps (Coenzyme Q10) .... Take One By Mouth At Bedtime 7)  Effexor 75 Mg  Tabs (Venlafaxine Hcl) .... One Tab By Mouth Once Daily 8)  Warfarin Sodium 5 Mg  Tabs (Warfarin Sodium) .... As Directed 9)  Warfarin Sodium 3 Mg Tabs (Warfarin Sodium) .Marland Kitchen.. 1 Daily By Mouth Are As Directed 10)  Metoprolol Tartrate 25 Mg Tabs (Metoprolol Tartrate) .... Half Tablet Twice Daily 11)  Vitamin B-12 Cr 2000 Mcg Cr-Tabs (Cyanocobalamin) .Marland Kitchen.. 1 Tab Under The Tongue Every Other Day 12)  Vitamin D 1000 Unit Tabs (Cholecalciferol) .... One Tab By Mouth Two Times A Day  13)  Oxybutynin Chloride 5 Mg Tabs (Oxybutynin Chloride) .... One By Mouth Two Times A Day 14)  Prenatal Vitamins 0.8 Mg Tabs (Prenatal Multivit-Min-Fe-Fa) .Marland Kitchen.. 1 By Mouth Once Daily 15)  Imodium A-D 2 Mg Tabs (Loperamide Hcl) .... By Mouth As Directed As Needed 16)  Zithromax Z-Pak 250 Mg Tabs (Azithromycin) .... Use As Directed 17)  Cheratussin Ac 100-10 Mg/77ml Syrp (Guaifenesin-Codeine) .... One Teaspoon At Bedtime As Needed Cough 18)  Ventolin Hfa 108 (90 Base) Mcg/act Aers (Albuterol Sulfate) .... 2 Puffs Q4-6 Hours As Needed Cough/sob  Allergies: 1)  ! Sulfadiazine (Sulfadiazine) 2)  !  Penicillin V Potassium (Penicillin V Potassium) 3)  ! Axid (Nizatidine)  Physical Exam  General:  Well-developed,well-nourished,in no acute distress; alert,appropriate and cooperative throughout examination, mildly obese, congested and miserable. Head:  Normocephalic and atraumatic without obvious abnormalities. No apparent alopecia or balding.  no sinus tenderness Eyes:  No corneal or conjunctival inflammation noted. EOMI. Perrla. Ears:  TMs clear bilaterally Nose:  nares clear, some irritation bilaterally with mild clear discharge. Mouth:  MMM, no pharyngeal exudates.  mild erythema and thick clear PND. Neck:  No deformities, masses, or tenderness noted.  no bruits Lungs:  Normal respiratory effort, chest expands symmetrically. Lungs are clear to auscultation, no crackles or wheezes. Heart:  Normal rate and regular rhythm. S1 and S2 normal without gallop, murmur, click, rub or other extra sounds.   Impression & Recommendations:  Problem # 1:  OTHER SPECIFIED SITES OF SPRAINS AND STRAINS, L FLANK (ICD-848.8) Assessment New Appears to be pulled Lat muscle on left. See instructions. Use heat and ice aggressively as discussed.  Problem # 2:  URI (ICD-465.9) Assessment: New  See instructions, stop Mucinex and take immmed release Guaif. The following medications were removed from the medication list:    Cheratussin Ac 100-10 Mg/68ml Syrp (Guaifenesin-codeine) ..... One teaspoon at bedtime as needed cough  Instructed on symptomatic treatment. Call if symptoms persist or worsen.   Complete Medication List: 1)  Lipitor 40 Mg Tabs (Atorvastatin calcium) .... Take one by mouth daily 2)  Flonase 50 Mcg/act Susp (Fluticasone propionate) .... One sqiurt each nostril two times a day as needed. 3)  Tricor 145 Mg Tabs (Fenofibrate) .... Take one by mouth daily 4)  Valtrex 1 Gm Tabs (Valacyclovir hcl) .... As needed 5)  Aciphex 20 Mg Tbec (Rabeprazole sodium) .... Take one by mouth daily 6)   Coq-10 100 Mg Caps (Coenzyme q10) .... Take one by mouth at bedtime 7)  Effexor 75 Mg Tabs (Venlafaxine hcl) .... One tab by mouth once daily 8)  Warfarin Sodium 5 Mg Tabs (Warfarin sodium) .... As directed 9)  Warfarin Sodium 3 Mg Tabs (Warfarin sodium) .Marland Kitchen.. 1 daily by mouth are as directed 10)  Metoprolol Tartrate 25 Mg Tabs (Metoprolol tartrate) .... Half tablet twice daily 11)  Vitamin B-12 Cr 2000 Mcg Cr-tabs (Cyanocobalamin) .Marland Kitchen.. 1 tab under the tongue every other day 12)  Vitamin D 1000 Unit Tabs (Cholecalciferol) .... One tab by mouth two times a day 13)  Oxybutynin Chloride 5 Mg Tabs (Oxybutynin chloride) .... One by mouth two times a day 14)  Prenatal Vitamins 0.8 Mg Tabs (Prenatal multivit-min-fe-fa) .Marland Kitchen.. 1 by mouth once daily 15)  Imodium A-d 2 Mg Tabs (Loperamide hcl) .... By mouth as directed as needed  Patient Instructions: 1)  take Flexeril 10 mg three times a day as tolerated. 2)  Take Tyl as directed. 3)  Take Guaifenesin by going to [facility], [employer]  or [facility] and getting MUCOUS RELIEF EXPECTORANT (400mg ), take 11/2 tabs by mouth AM and NOON. 4)  Drink lots of fluids anytime taking Guaifenesin.    Orders Added: 1)  Est. Patient Level IV [[address]]    Current Allergies (reviewed today): ! SULFADIAZINE (SULFADIAZINE) ! PENICILLIN V POTASSIUM (PENICILLIN V POTASSIUM) ! AXID (NIZATIDINE)  Appended Document: ? UTI  Laboratory Results   Urine Tests  Date/Time Received: November 13, 2010 11:27 AM  Date/Time Reported: November 13, 2010 11:27 AM   Routine Urinalysis   Color: yellow Appearance: Clear Glucose: negative   (Normal Range: Negative) Bilirubin: negative   (Normal Range: Negative) Ketone: negative   (Normal Range: Negative) Spec. Gravity: 1.015   (Normal Range: 1.003-1.035) Blood: negative   (Normal Range: Negative) pH: 7.0   (Normal Range: 5.0-8.0) Protein: negative   (Normal Range: Negative) Urobilinogen: 0.2   (Normal Range: 0-1) Nitrite:  negative   (Normal Range: Negative) Leukocyte Esterace: negative   (Normal Range: Negative)

## 2010-11-25 ENCOUNTER — Encounter: Payer: Self-pay | Admitting: Family Medicine

## 2010-11-25 ENCOUNTER — Ambulatory Visit (INDEPENDENT_AMBULATORY_CARE_PROVIDER_SITE_OTHER): Payer: Medicare Other

## 2010-11-25 DIAGNOSIS — T81718A Complication of other artery following a procedure, not elsewhere classified, initial encounter: Secondary | ICD-10-CM

## 2010-11-25 DIAGNOSIS — Z7901 Long term (current) use of anticoagulants: Secondary | ICD-10-CM

## 2010-11-25 DIAGNOSIS — Z5181 Encounter for therapeutic drug level monitoring: Secondary | ICD-10-CM

## 2010-11-25 LAB — CONVERTED CEMR LAB: Prothrombin Time: 41.3 s

## 2010-11-26 NOTE — Medication Information (Signed)
Summary: INR 4.2   PCP: Eustaquio Boyden  MD Indication 1: Deep venous thrombosis PT 50.7 INR RANGE 2.5-3.5           Allergies: 1)  ! Sulfadiazine (Sulfadiazine) 2)  ! Penicillin V Potassium (Penicillin V Potassium) 3)  ! Axid (Nizatidine)  Anticoagulation Management History:      Positive risk factors for bleeding include an age of 2 years or older and presence of serious comorbidities.  The bleeding index is 'intermediate risk'.  Positive CHADS2 values include History of HTN and Age > 31 years old.  His last INR was 2.5 and today's INR is 4.2.  Prothrombin time is 50.7.    Anticoagulation Management Assessment/Plan:      The patient's current anticoagulation dose is Warfarin sodium 5 mg  tabs: as directed, Warfarin sodium 3 mg tabs: 1 DAILY BY MOUTH ARE AS DIRECTED.  The next INR is due 1 week.        Laboratory Results   Blood Tests   Date/Time Recieved: November 18, 2010 10:50 AM Date/Time Reported: November 18, 2010 10:50 AM  PT: 50.7 s   (Normal Range: 10.6-13.4)  INR: 4.2   (Normal Range: 0.88-1.12   Therap INR: 2.0-3.5)      ANTICOAGULATION RECORD PREVIOUS REGIMEN & LAB RESULTS Anticoagulation Diagnosis:  Deep venous thrombosis on  10/01/2009 Previous INR Goal Range:  2.5-3.5 on  10/01/2009 Previous INR:  2.5 on  10/21/2010 Previous Coumadin Dose(mg):  3 mg daily on  05/20/2010 Previous Regimen:  3mg  qd on  09/23/2010 Previous Coagulation Comments:  . on  12/10/2009  NEW REGIMEN & LAB RESULTS Current INR: 4.2 Regimen: hold dose today then resume 3mg  qd  Provider: guttierrez      Repeat testing in: 1 week MEDICATIONS LIPITOR 40 MG  TABS (ATORVASTATIN CALCIUM) Take one by mouth daily FLONASE 50 MCG/ACT  SUSP (FLUTICASONE PROPIONATE) one sqiurt each nostril two times a day as needed. TRICOR 145 MG  TABS (FENOFIBRATE) Take one by mouth daily VALTREX 1 GM  TABS (VALACYCLOVIR HCL) As needed ACIPHEX 20 MG  TBEC (RABEPRAZOLE SODIUM) Take one by mouth  daily COQ-10 100 MG  CAPS (COENZYME Q10) Take one by mouth at bedtime EFFEXOR 75 MG  TABS (VENLAFAXINE HCL) one tab by mouth once daily WARFARIN SODIUM 5 MG  TABS (WARFARIN SODIUM) as directed WARFARIN SODIUM 3 MG TABS (WARFARIN SODIUM) 1 DAILY BY MOUTH ARE AS DIRECTED METOPROLOL TARTRATE 25 MG TABS (METOPROLOL TARTRATE) half tablet twice daily VITAMIN B-12 CR 2000 MCG CR-TABS (CYANOCOBALAMIN) 1 tab under the tongue every other day VITAMIN D 1000 UNIT TABS (CHOLECALCIFEROL) one tab by mouth two times a day OXYBUTYNIN CHLORIDE 5 MG TABS (OXYBUTYNIN CHLORIDE) one by mouth two times a day PRENATAL VITAMINS 0.8 MG TABS (PRENATAL MULTIVIT-MIN-FE-FA) 1 by mouth once daily IMODIUM A-D 2 MG TABS (LOPERAMIDE HCL) by mouth as directed as needed FLEXERIL 10 MG TABS (CYCLOBENZAPRINE HCL) 1/2 - 1 tab by mouth three times a day as needed for muscle pasin/spasm  Dose has been reviewed with patient or caretaker during this visit.  Reviewed by: Allison Quarry  Anticoagulation Visit Questionnaire      Coumadin dose missed/changed:  Yes      Coumadin Dose Comments:  one or more missed dose(s)      Abnormal Bleeding Symptoms:  No Any diet changes including alcohol intake, vegetables or greens since the last visit:  Yes      Diet Comments:sick, eating less Any illnesses or hospitalizations since the  last visit:  Yes      Recent Illness/Hospitalizations:  cold Any signs of clotting since the last visit (including chest discomfort, dizziness, shortness of breath, arm tingling, slurred speech, swelling or redness in leg):  No

## 2010-11-29 ENCOUNTER — Encounter: Payer: Self-pay | Admitting: Family Medicine

## 2010-12-03 ENCOUNTER — Ambulatory Visit (HOSPITAL_COMMUNITY): Payer: Medicare Other | Attending: Family Medicine | Admitting: Radiology

## 2010-12-03 VITALS — Ht 67.0 in | Wt 170.0 lb

## 2010-12-03 DIAGNOSIS — R0602 Shortness of breath: Secondary | ICD-10-CM

## 2010-12-03 DIAGNOSIS — R079 Chest pain, unspecified: Secondary | ICD-10-CM | POA: Insufficient documentation

## 2010-12-03 MED ORDER — REGADENOSON 0.4 MG/5ML IV SOLN
0.4000 mg | Freq: Once | INTRAVENOUS | Status: AC
  Administered 2010-12-03: 0.4 mg via INTRAVENOUS

## 2010-12-03 MED ORDER — TECHNETIUM TC 99M TETROFOSMIN IV KIT
33.0000 | PACK | Freq: Once | INTRAVENOUS | Status: AC | PRN
  Administered 2010-12-03: 33 via INTRAVENOUS

## 2010-12-03 MED ORDER — TECHNETIUM TC 99M TETROFOSMIN IV KIT
11.0000 | PACK | Freq: Once | INTRAVENOUS | Status: AC | PRN
  Administered 2010-12-03: 11 via INTRAVENOUS

## 2010-12-03 NOTE — Progress Notes (Signed)
[facility] SITE 3 NUCLEAR MED [address] [place] [address] [phone]  Cardiology Nuclear Med Study [name] male 1931-09-06   Nuclear Med Background Indication for Stress Test:  Evaluation for Ischemia and Post Hospital 10/26/10 fall, Chest pain 2 hours later. History:  04/09 Echo 55% and GXT around 10-12 yrs ago at [facility]. Cardiac Risk Factors: Hypertension and Lipids  Symptoms:  Chest Pain (last date of chest discomfort 10/26/2010)   Nuclear Pre-Procedure Caffeine/Decaff Intake:  none NPO After: 8:30pm   Lungs:  clear IV 0.9% NS with Angio Cath:  20g  IV Site: R Antecubital  IV Started by:  Milana Na, EMT-P  Chest Size (in): 44 Cup Size:  N/A  Height: 5\' 7"  (1.702 m)  Weight:  170 lb (77.111 kg)  BMI:  Body mass index is 26.63 kg/(m^2). Tech Comments:     Nuclear Med Study 1 or 2 day study: 1 day  Stress Test Type:  Lexiscan  Reading MD: Marca Ancona, MD  Order Authorizing Provider:  Eustaquio Boyden  Resting Radionuclide: Technetium 75m Tetrofosmin  Resting Radionuclide Dose: 11 mCi   Stress Radionuclide:  Technetium 14m Tetrofosmin  Stress Radionuclide Dose: 33 mCi           Stress Protocol Rest HR: 57 Stress HR: 77  Rest BP: 117/43 Stress BP: 117/40  Exercise Time: N/A METS: N/A  Predicted HR: N/a % of Maximum:N/A        Dose of Adenosine: N/A Dose of Lexiscan:  0.4 mg  Dose of Atropine:  N/A Dose of Dobutamine: N/A  Stress Test Technologist: Milana Na, EMT-P  Nuclear Technologist:  Domenic Polite, CNMT     Rest Procedure:  Myocardial perfusion imaging was performed at rest 45 minutes following the intravenous administration of Technetium 54m Tetrofosmin. Rest ECG: Sinus Bradycardia  Stress Procedure:  The patient received IV Lexiscan 0.4 mg over 15-seconds.  Technetium 45m Tetrofosmin injected at 30-seconds.  There were no significant changes with Lexiscan.  Quantitative spect images were obtained  after a 45 minute delay. Stress ECG: No significant change from baseline ECG  QPS Raw Data Images:  Normal; no motion artifact; normal heart/lung ratio. Stress Images:  Normal homogeneous uptake in all areas of the myocardium. Rest Images:  Normal homogeneous uptake in all areas of the myocardium. Subtraction (SDS):  There is no evidence of scar or ischemia.  Findings Risk Category:  Normal nuclear study. Clinically Abnormal:  No Ischemia:  No Fixed Defect:  No LV Dysfunction:  No Transient Ischemic Dilatation (Normal <1.22): 1.06 Lung/Heart Ratio (Normal <0.45):  .38  Quantitative Gated Spect Images QGS EDV:  76 ml QGS ESV:  19 ml QGS cine images:  Normal wall motion.  QGS EF:  75 %  Impression Exercise Capacity:  Lexiscan with no exercise. BP Response:  Normal blood pressure response. Clinical Symptoms:  warm ECG Impression:  No significant ST segment change suggestive of ischemia. Comparison with Prior Nuclear Study: No previous nuclear study performed  Overall Impression:  Normal stress nuclear study.

## 2010-12-05 NOTE — Medication Information (Signed)
Summary: INR 3.4   PCP: Eustaquio Boyden  MD Indication 1: Deep venous thrombosis PT 41.3 INR RANGE 2.5-3.5           Allergies: 1)  ! Sulfadiazine (Sulfadiazine) 2)  ! Penicillin V Potassium (Penicillin V Potassium) 3)  ! Axid (Nizatidine)  Anticoagulation Management History:      Positive risk factors for bleeding include an age of 75 years or older and presence of serious comorbidities.  The bleeding index is 'intermediate risk'.  Positive CHADS2 values include History of HTN and Age > 75 years old.  His last INR was 4.2 and today's INR is 3.4.  Prothrombin time is 41.3.    Anticoagulation Management Assessment/Plan:      The patient's current anticoagulation dose is Warfarin sodium 5 mg  tabs: as directed, Warfarin sodium 3 mg tabs: 1 DAILY BY MOUTH ARE AS DIRECTED.  The next INR is due 2 weeks.        Laboratory Results   Blood Tests   Date/Time Recieved: November 25, 2010 10:18 AM  Date/Time Reported: November 25, 2010 10:18 AM   PT: 41.3 s   (Normal Range: 10.6-13.4)  INR: 3.4   (Normal Range: 0.88-1.12   Therap INR: 2.0-3.5)      ANTICOAGULATION RECORD PREVIOUS REGIMEN & LAB RESULTS Anticoagulation Diagnosis:  Deep venous thrombosis on  10/01/2009 Previous INR Goal Range:  2.5-3.5 on  10/01/2009 Previous INR:  4.2 on  11/18/2010 Previous Coumadin Dose(mg):  3 mg daily on  05/20/2010 Previous Regimen:  hold dose today then resume 3mg  qd on  11/18/2010 Previous Coagulation Comments:  . on  12/10/2009  NEW REGIMEN & LAB RESULTS Current INR: 3.4 Regimen: 3mg  qd, TU, Thur 1.5mg  Coagulation Comments: held 1 dose per instruction Provider: Slyvester Latona      Repeat testing in: 2 weeks MEDICATIONS LIPITOR 40 MG  TABS (ATORVASTATIN CALCIUM) Take one by mouth daily FLONASE 50 MCG/ACT  SUSP (FLUTICASONE PROPIONATE) one sqiurt each nostril two times a day as needed. TRICOR 145 MG  TABS (FENOFIBRATE) Take one by mouth daily VALTREX 1 GM  TABS (VALACYCLOVIR HCL) As  needed ACIPHEX 20 MG  TBEC (RABEPRAZOLE SODIUM) Take one by mouth daily COQ-10 100 MG  CAPS (COENZYME Q10) Take one by mouth at bedtime EFFEXOR 75 MG  TABS (VENLAFAXINE HCL) one tab by mouth once daily WARFARIN SODIUM 5 MG  TABS (WARFARIN SODIUM) as directed WARFARIN SODIUM 3 MG TABS (WARFARIN SODIUM) 1 DAILY BY MOUTH ARE AS DIRECTED METOPROLOL TARTRATE 25 MG TABS (METOPROLOL TARTRATE) half tablet twice daily VITAMIN B-12 CR 2000 MCG CR-TABS (CYANOCOBALAMIN) 1 tab under the tongue every other day VITAMIN D 1000 UNIT TABS (CHOLECALCIFEROL) one tab by mouth two times a day OXYBUTYNIN CHLORIDE 5 MG TABS (OXYBUTYNIN CHLORIDE) one by mouth two times a day PRENATAL VITAMINS 0.8 MG TABS (PRENATAL MULTIVIT-MIN-FE-FA) 1 by mouth once daily IMODIUM A-D 2 MG TABS (LOPERAMIDE HCL) by mouth as directed as needed FLEXERIL 10 MG TABS (CYCLOBENZAPRINE HCL) 1/2 - 1 tab by mouth three times a day as needed for muscle pasin/spasm  Dose has been reviewed with patient or caretaker during this visit.  Reviewed by: Allison Quarry  Anticoagulation Visit Questionnaire      Coumadin dose missed/changed:  Yes      Coumadin Dose Comments:  one or more missed dose(s)      Abnormal Bleeding Symptoms:  No Any diet changes including alcohol intake, vegetables or greens since the last visit:  No Any illnesses or hospitalizations  since the last visit:  No Any signs of clotting since the last visit (including chest discomfort, dizziness, shortness of breath, arm tingling, slurred speech, swelling or redness in leg):  No

## 2010-12-09 ENCOUNTER — Ambulatory Visit (INDEPENDENT_AMBULATORY_CARE_PROVIDER_SITE_OTHER): Payer: Medicare Other | Admitting: Family Medicine

## 2010-12-09 DIAGNOSIS — I82409 Acute embolism and thrombosis of unspecified deep veins of unspecified lower extremity: Secondary | ICD-10-CM

## 2010-12-09 DIAGNOSIS — Z5181 Encounter for therapeutic drug level monitoring: Secondary | ICD-10-CM

## 2010-12-09 DIAGNOSIS — Z7901 Long term (current) use of anticoagulants: Secondary | ICD-10-CM

## 2010-12-09 NOTE — Patient Instructions (Signed)
No change in dose 

## 2010-12-12 ENCOUNTER — Encounter: Payer: Self-pay | Admitting: Family Medicine

## 2010-12-18 ENCOUNTER — Telehealth: Payer: Self-pay | Admitting: Radiology

## 2010-12-18 DIAGNOSIS — I2699 Other pulmonary embolism without acute cor pulmonale: Secondary | ICD-10-CM

## 2010-12-18 DIAGNOSIS — Z7901 Long term (current) use of anticoagulants: Secondary | ICD-10-CM

## 2010-12-18 DIAGNOSIS — Z5181 Encounter for therapeutic drug level monitoring: Secondary | ICD-10-CM

## 2010-12-18 NOTE — Telephone Encounter (Signed)
Order for INR

## 2011-01-06 ENCOUNTER — Other Ambulatory Visit: Payer: Self-pay | Admitting: Family Medicine

## 2011-01-06 ENCOUNTER — Ambulatory Visit (INDEPENDENT_AMBULATORY_CARE_PROVIDER_SITE_OTHER): Payer: Medicare Other | Admitting: Family Medicine

## 2011-01-06 DIAGNOSIS — I82409 Acute embolism and thrombosis of unspecified deep veins of unspecified lower extremity: Secondary | ICD-10-CM

## 2011-01-06 DIAGNOSIS — Z5181 Encounter for therapeutic drug level monitoring: Secondary | ICD-10-CM

## 2011-01-06 DIAGNOSIS — Z7901 Long term (current) use of anticoagulants: Secondary | ICD-10-CM

## 2011-01-20 ENCOUNTER — Ambulatory Visit (INDEPENDENT_AMBULATORY_CARE_PROVIDER_SITE_OTHER): Payer: Medicare Other | Admitting: Family Medicine

## 2011-01-20 DIAGNOSIS — Z5181 Encounter for therapeutic drug level monitoring: Secondary | ICD-10-CM

## 2011-01-20 DIAGNOSIS — I82409 Acute embolism and thrombosis of unspecified deep veins of unspecified lower extremity: Secondary | ICD-10-CM

## 2011-01-20 DIAGNOSIS — Z7901 Long term (current) use of anticoagulants: Secondary | ICD-10-CM

## 2011-01-20 LAB — POCT INR: INR: 2.5

## 2011-01-20 NOTE — Patient Instructions (Signed)
Continue current dose and return in 4 weeks.  

## 2011-01-21 NOTE — Assessment & Plan Note (Signed)
[facility]                                 ON-CALL NOTE   NAME:[name], [name]                     MRN:          [id number]  DATE:02/01/2007                            DOB:          1931/06/12    Phone Number [phone].  Call came in at 8:04 a.m.   I called [name] back and he just had time to tell me that he was  having some loose stools and we got static and lost our connection.  I  tried calling him back 4 different times and got their answering  machine.  I did leave them a message that if he was not having pain or  fever that it probably was not a emergency that there was stomach bug  around.  I also told him that I do not suggest Imodium in these  situations.  I told him he could call back later if he still needed to  talk to me, but that I just could not get back through to his phone  number.     Karie Schwalbe, MD  Electronically Signed    RIL/MedQ  DD: 02/01/2007  DT: 02/01/2007  Job #: [id number]   cc:   Arta Silence, MD

## 2011-01-24 NOTE — Procedures (Signed)
[facility]  Patient:    [name], [name]                       MRN: [id number] Proc. Date: 06/02/00 Adm. Date:  [id number] Disc. Date: [id number] Attending:  Sabino Gasser                           Procedure Report  PROCEDURE: Upper endoscopy.  ENDOSCOPIST: Sabino Gasser, M.D.  INDICATIONS FOR PROCEDURE: Hemoccult positivity.  ANESTHESIA: Demerol 75 mg, Versed 7 mg.  DESCRIPTION OF PROCEDURE: With the patient mildly sedated and in the left lateral decubitus position, the Olympus videoscopic endoscope was inserted into the mouth and passed under direct vision through the esophagus into the stomach.  The fundus, body, antrum, duodenal bulb, and second portion of the duodenum were all visualized and photographs were taken.  The endoscope was pulled back to the stomach after taking circumferential views of the duodenal mucosa.  In the stomach the antrum appeared quite erythematous.  This was photographed and biopsied.  The endoscope was placed in retroflexion for view of the stomach from below and was photographed.  The endoscope was then straightened and pulled back from distal to proximal stomach, taking circumferential views of the entire gastric and remaining esophageal mucosa, all of which appeared otherwise normal.  The patients vital signs and pulse oximetry remained stable.  The patient tolerated the procedure well without apparent complications.  FINDINGS: Erythema of the antrum.  Await biopsy report.  Otherwise unremarkable examination.  PLAN: Proceed with colonoscopy. DD:  06/02/00 TD:  06/03/00 Job: [id number]

## 2011-01-24 NOTE — Assessment & Plan Note (Signed)
[facility]                                   ON-CALL NOTE   NAME:[name], [name]                       MRN:          [id number]  DATE:05/24/2006                            DOB:          Sep 13, 1930    A patient of Dr. Lorenza Chick calls from [phone] complaining of sores on his  scrotum.  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  The  patient will go see Dr. Hetty Ely in the morning but I did call in  doxycycline 100 mg, twice a day for 10 days.   ALLERGIES:  The patient is allergic PENICILLIN AND SULFA only.                                   Lelon Perla, DO   YRL/MedQ  DD:  05/24/2006  DT:  05/25/2006  Job #:  [id number]   cc:   Arta Silence, MD

## 2011-01-24 NOTE — Procedures (Signed)
[facility]  Patient:    [name], [name]                       MRN: [id number] Proc. Date: 06/22/00 Adm. Date:  [id number] Disc. Date: [id number] Attending:  Sabino Gasser                           Procedure Report  PROCEDURE:  Upper endoscopy.  ANESTHESIA:  Demerol 75 mg, Versed 7 mg.  DESCRIPTION OF PROCEDURE:  With the patient mildly sedated in the left lateral decubitus position, the Olympus videoscopic endoscope inserted in the mouth and passed under direct vision through the esophagus, which appeared normal, into the stomach, fundus, body, antrum, all visualized.  The antrum had some erythema, which we biopsied.  It may have been normal.  We entered into the duodenal bulb and the second portion of the duodenum, both of which appeared normal and were photographed.  From this point, the endoscope was slowly withdrawn, taking circumferential views of the entire duodenal mucosa until the endoscope had been pulled back in the stomach, placed in retroflexion to view the stomach from below, which appeared normal.  It was photographed.  The endoscope was straightened and withdrawn, taking circumferential views of the entire gastric and esophageal mucosa, which otherwise appeared normal.  The patients vital signs and pulse oximetry remained stable.  The patient tolerated the procedure without apparent complications.  FINDINGS:  Mild erythema of the antrum, await biopsy report, otherwise unremarkable endoscopic exam.  The patient will call me for results and follow up with me as an outpatient. DD:  06/22/00 TD:  06/22/00 Job: [id number]

## 2011-01-24 NOTE — Procedures (Signed)
[place]  Patient:    [name], [name]                       MRN: [id number] Proc. Date: 06/02/00 Adm. Date:  [id number] Disc. Date: [id number] Attending:  Sabino Gasser                           Procedure Report  PROCEDURE: Colonoscopy.  ENDOSCOPIST: Sabino Gasser, M.D.  INDICATIONS FOR PROCEDURE: Hemoccult positivity.  ANESTHESIA: None further given.  DESCRIPTION OF PROCEDURE: With the patient mildly sedated and in the left lateral decubitus position, the Olympus videoscopic colonoscope was inserted into the rectum and passed under direct vision to the cecum.  The cecum was identified by the ileocecal valve and appendiceal orifice, both of which were photographed.  Of note, the print was slightly suboptimal in that there was semisolid stool and copious amounts of brownish liquid in the colon, but from this point the colonoscope was slowly withdrawn taking circumferential views of the entire colonic mucosa, stopping to photograph a polyp seen in the descending/sigmoid colon area.  This was removed in the scope and once this was done with snare cautery technique at a setting of 20/20 blended current, the endoscope was removed to the rectum, which appeared normal on direct view and retroflex view.  The colonoscope was then straightened and withdrawn.  The patients vital signs and pulse oximetry remained stable and the patient tolerated the procedure well without apparent complications.  FINDINGS: Polyp, as noted above.  Await biopsy report.  The patient will call for results and follow up with me as an outpatient. DD:  06/02/00 TD:  06/03/00 Job: [id number]

## 2011-02-17 ENCOUNTER — Ambulatory Visit (INDEPENDENT_AMBULATORY_CARE_PROVIDER_SITE_OTHER): Payer: Medicare Other | Admitting: Family Medicine

## 2011-02-17 DIAGNOSIS — Z7901 Long term (current) use of anticoagulants: Secondary | ICD-10-CM

## 2011-02-17 DIAGNOSIS — Z5181 Encounter for therapeutic drug level monitoring: Secondary | ICD-10-CM

## 2011-02-17 DIAGNOSIS — I82409 Acute embolism and thrombosis of unspecified deep veins of unspecified lower extremity: Secondary | ICD-10-CM

## 2011-02-17 LAB — POCT INR: INR: 2.2

## 2011-02-17 NOTE — Patient Instructions (Signed)
Continue current dose, check in 4 weeks  

## 2011-02-20 ENCOUNTER — Other Ambulatory Visit: Payer: Self-pay | Admitting: Family Medicine

## 2011-02-24 ENCOUNTER — Encounter: Payer: Self-pay | Admitting: Cardiovascular Disease

## 2011-02-27 ENCOUNTER — Other Ambulatory Visit: Payer: Self-pay | Admitting: Family Medicine

## 2011-03-17 ENCOUNTER — Ambulatory Visit (INDEPENDENT_AMBULATORY_CARE_PROVIDER_SITE_OTHER): Payer: Medicare Other | Admitting: Family Medicine

## 2011-03-17 DIAGNOSIS — Z7901 Long term (current) use of anticoagulants: Secondary | ICD-10-CM

## 2011-03-17 DIAGNOSIS — Z5181 Encounter for therapeutic drug level monitoring: Secondary | ICD-10-CM

## 2011-03-17 DIAGNOSIS — I82409 Acute embolism and thrombosis of unspecified deep veins of unspecified lower extremity: Secondary | ICD-10-CM

## 2011-03-17 LAB — POCT INR: INR: 1.8

## 2011-03-17 NOTE — Patient Instructions (Signed)
Increase to 3 qd, recheck 2 weeks

## 2011-03-31 ENCOUNTER — Ambulatory Visit (INDEPENDENT_AMBULATORY_CARE_PROVIDER_SITE_OTHER): Payer: Medicare Other | Admitting: Family Medicine

## 2011-03-31 DIAGNOSIS — Z7901 Long term (current) use of anticoagulants: Secondary | ICD-10-CM

## 2011-03-31 DIAGNOSIS — I82409 Acute embolism and thrombosis of unspecified deep veins of unspecified lower extremity: Secondary | ICD-10-CM

## 2011-03-31 DIAGNOSIS — Z5181 Encounter for therapeutic drug level monitoring: Secondary | ICD-10-CM

## 2011-03-31 NOTE — Patient Instructions (Signed)
Take 3 qd, recheck 2 weeks

## 2011-04-14 ENCOUNTER — Ambulatory Visit: Payer: Medicare Other

## 2011-04-21 ENCOUNTER — Ambulatory Visit (INDEPENDENT_AMBULATORY_CARE_PROVIDER_SITE_OTHER): Payer: Medicare Other | Admitting: Family Medicine

## 2011-04-21 DIAGNOSIS — Z7901 Long term (current) use of anticoagulants: Secondary | ICD-10-CM

## 2011-04-21 DIAGNOSIS — I82409 Acute embolism and thrombosis of unspecified deep veins of unspecified lower extremity: Secondary | ICD-10-CM

## 2011-04-21 DIAGNOSIS — Z5181 Encounter for therapeutic drug level monitoring: Secondary | ICD-10-CM

## 2011-04-21 LAB — POCT INR: INR: 3.5

## 2011-04-21 NOTE — Patient Instructions (Signed)
Decrease to 3 mg daily 1.5 mg mon, recheck 2 weeks

## 2011-05-05 ENCOUNTER — Ambulatory Visit (INDEPENDENT_AMBULATORY_CARE_PROVIDER_SITE_OTHER): Payer: Medicare Other | Admitting: Family Medicine

## 2011-05-05 DIAGNOSIS — I82409 Acute embolism and thrombosis of unspecified deep veins of unspecified lower extremity: Secondary | ICD-10-CM

## 2011-05-05 DIAGNOSIS — Z7901 Long term (current) use of anticoagulants: Secondary | ICD-10-CM

## 2011-05-05 DIAGNOSIS — Z5181 Encounter for therapeutic drug level monitoring: Secondary | ICD-10-CM

## 2011-05-05 LAB — POCT INR: INR: 2.5

## 2011-05-05 NOTE — Patient Instructions (Signed)
Continue 3 mg daily 1.5 mg mon, recheck 4 weeks 

## 2011-05-22 ENCOUNTER — Other Ambulatory Visit: Payer: Self-pay | Admitting: *Deleted

## 2011-05-22 ENCOUNTER — Telehealth: Payer: Self-pay | Admitting: *Deleted

## 2011-05-22 MED ORDER — OXYBUTYNIN CHLORIDE 5 MG PO TABS: 5.0000 mg | ORAL_TABLET | Freq: Two times a day (BID) | ORAL | Status: DC

## 2011-05-22 NOTE — Telephone Encounter (Signed)
Patient came in requesting samples of oxybutynin until his mail order arrives. I told him that was generic, so we had no samples and I would call some in locally for him. He said his insurance discourages that. We had samples of Vesicare. Dr. Sharen Hones advised that it was okay to substitute the Vesicare 5mg  1 QD until the oxybutynin arrives via mail order. Patient given instructions and he verbalized understanding.   LOT: [id number] EXP: 11-06-11

## 2011-05-22 NOTE — Telephone Encounter (Signed)
Noted  

## 2011-05-27 ENCOUNTER — Telehealth: Payer: Self-pay | Admitting: *Deleted

## 2011-05-27 NOTE — Telephone Encounter (Signed)
[facility] is asking for clarification on oxybutynin script.  Last script sent in is for plain, but patient has previously been on extended release, and I dont see in chart where that was changed.  Pharmacist has cancelled all this script, so new script will need to be sent to [facility].

## 2011-05-28 MED ORDER — OXYBUTYNIN CHLORIDE ER 5 MG PO TB24: 5.0000 mg | ORAL_TABLET | Freq: Every day | ORAL | Status: DC

## 2011-05-28 NOTE — Telephone Encounter (Addendum)
Continue ER.  I've sent in ER.  Removed immediate release from list.

## 2011-06-02 ENCOUNTER — Other Ambulatory Visit (INDEPENDENT_AMBULATORY_CARE_PROVIDER_SITE_OTHER): Payer: Medicare Other

## 2011-06-02 ENCOUNTER — Ambulatory Visit (INDEPENDENT_AMBULATORY_CARE_PROVIDER_SITE_OTHER): Payer: Medicare Other | Admitting: Family Medicine

## 2011-06-02 ENCOUNTER — Other Ambulatory Visit: Payer: Self-pay | Admitting: Family Medicine

## 2011-06-02 DIAGNOSIS — N259 Disorder resulting from impaired renal tubular function, unspecified: Secondary | ICD-10-CM

## 2011-06-02 DIAGNOSIS — E538 Deficiency of other specified B group vitamins: Secondary | ICD-10-CM

## 2011-06-02 DIAGNOSIS — Z0000 Encounter for general adult medical examination without abnormal findings: Secondary | ICD-10-CM

## 2011-06-02 DIAGNOSIS — D6949 Other primary thrombocytopenia: Secondary | ICD-10-CM

## 2011-06-02 DIAGNOSIS — E559 Vitamin D deficiency, unspecified: Secondary | ICD-10-CM

## 2011-06-02 DIAGNOSIS — I10 Essential (primary) hypertension: Secondary | ICD-10-CM

## 2011-06-02 DIAGNOSIS — Z7901 Long term (current) use of anticoagulants: Secondary | ICD-10-CM

## 2011-06-02 DIAGNOSIS — I82409 Acute embolism and thrombosis of unspecified deep veins of unspecified lower extremity: Secondary | ICD-10-CM

## 2011-06-02 DIAGNOSIS — Z5181 Encounter for therapeutic drug level monitoring: Secondary | ICD-10-CM

## 2011-06-02 DIAGNOSIS — R972 Elevated prostate specific antigen [PSA]: Secondary | ICD-10-CM

## 2011-06-02 LAB — CBC WITH DIFFERENTIAL/PLATELET
Basophils Absolute: 0 10*3/uL (ref 0.0–0.1)
Basophils Relative: 0.5 % (ref 0.0–3.0)
HCT: 37.6 % — ABNORMAL LOW (ref 39.0–52.0)
Hemoglobin: 12.6 g/dL — ABNORMAL LOW (ref 13.0–17.0)
Lymphs Abs: 1.4 10*3/uL (ref 0.7–4.0)
MCHC: 33.6 g/dL (ref 30.0–36.0)
Monocytes Relative: 7.5 % (ref 3.0–12.0)
Neutro Abs: 3.1 10*3/uL (ref 1.4–7.7)
RBC: 3.47 Mil/uL — ABNORMAL LOW (ref 4.22–5.81)
RDW: 14.5 % (ref 11.5–14.6)

## 2011-06-02 NOTE — Patient Instructions (Signed)
Continue 3 mg daily 1.5 mg mon, recheck 4 weeks

## 2011-06-03 LAB — TSH: TSH: 1.87 u[IU]/mL (ref 0.35–5.50)

## 2011-06-03 LAB — LIPID PANEL
HDL: 28.6 mg/dL — ABNORMAL LOW (ref >39.00)
Total CHOL/HDL Ratio: 5
VLDL: 38.2 mg/dL (ref 0.0–40.0)

## 2011-06-03 LAB — COMPREHENSIVE METABOLIC PANEL
ALT: 25 U/L (ref 0–53)
Alkaline Phosphatase: 26 U/L — ABNORMAL LOW (ref 39–117)
Sodium: 140 mEq/L (ref 135–145)
Total Bilirubin: 1.3 mg/dL — ABNORMAL HIGH (ref 0.3–1.2)
Total Protein: 6.7 g/dL (ref 6.0–8.3)

## 2011-06-09 ENCOUNTER — Encounter: Payer: Self-pay | Admitting: Family Medicine

## 2011-06-09 ENCOUNTER — Ambulatory Visit (INDEPENDENT_AMBULATORY_CARE_PROVIDER_SITE_OTHER): Payer: Medicare Other | Admitting: Family Medicine

## 2011-06-09 VITALS — BP 116/64 | HR 68 | Temp 98.3°F | Wt 179.5 lb

## 2011-06-09 DIAGNOSIS — N3941 Urge incontinence: Secondary | ICD-10-CM

## 2011-06-09 DIAGNOSIS — I2699 Other pulmonary embolism without acute cor pulmonale: Secondary | ICD-10-CM

## 2011-06-09 DIAGNOSIS — Z23 Encounter for immunization: Secondary | ICD-10-CM

## 2011-06-09 DIAGNOSIS — N259 Disorder resulting from impaired renal tubular function, unspecified: Secondary | ICD-10-CM

## 2011-06-09 DIAGNOSIS — D539 Nutritional anemia, unspecified: Secondary | ICD-10-CM

## 2011-06-09 DIAGNOSIS — D6949 Other primary thrombocytopenia: Secondary | ICD-10-CM

## 2011-06-09 DIAGNOSIS — Z0000 Encounter for general adult medical examination without abnormal findings: Secondary | ICD-10-CM | POA: Insufficient documentation

## 2011-06-09 DIAGNOSIS — E538 Deficiency of other specified B group vitamins: Secondary | ICD-10-CM

## 2011-06-09 DIAGNOSIS — I2510 Atherosclerotic heart disease of native coronary artery without angina pectoris: Secondary | ICD-10-CM

## 2011-06-09 DIAGNOSIS — I10 Essential (primary) hypertension: Secondary | ICD-10-CM

## 2011-06-09 DIAGNOSIS — E559 Vitamin D deficiency, unspecified: Secondary | ICD-10-CM

## 2011-06-09 MED ORDER — FENOFIBRATE 145 MG PO TABS: 145.0000 mg | ORAL_TABLET | Freq: Every day | ORAL | Status: DC

## 2011-06-09 MED ORDER — RABEPRAZOLE SODIUM 20 MG PO TBEC: 20.0000 mg | DELAYED_RELEASE_TABLET | Freq: Every day | ORAL | Status: DC

## 2011-06-09 MED ORDER — ATORVASTATIN CALCIUM 40 MG PO TABS: 40.0000 mg | ORAL_TABLET | Freq: Every day | ORAL | Status: DC

## 2011-06-09 MED ORDER — WARFARIN SODIUM 3 MG PO TABS: 3.0000 mg | ORAL_TABLET | ORAL | Status: DC

## 2011-06-09 MED ORDER — VENLAFAXINE HCL 75 MG PO TABS: 75.0000 mg | ORAL_TABLET | Freq: Every day | ORAL | Status: DC

## 2011-06-09 MED ORDER — METOPROLOL TARTRATE 25 MG PO TABS: 12.5000 mg | ORAL_TABLET | Freq: Two times a day (BID) | ORAL | Status: DC

## 2011-06-09 MED ORDER — OXYBUTYNIN CHLORIDE 5 MG PO TABS: 5.0000 mg | ORAL_TABLET | Freq: Two times a day (BID) | ORAL | Status: DC

## 2011-06-09 NOTE — Assessment & Plan Note (Signed)
Refilled oxybutynin 5mg  short acting bid. States stable with current dosing, denies side effects.

## 2011-06-09 NOTE — Assessment & Plan Note (Signed)
Longstanding history. Check folate, b12.

## 2011-06-09 NOTE — Assessment & Plan Note (Signed)
Macrocytic anemia, thrombocytopenia. Check folate, b12.

## 2011-06-09 NOTE — Patient Instructions (Addendum)
Prostate normal today. We will check folate and B12 vitamins. Please return in 6 mo for follow up, sooner if needed. Good to see you today, call us with questions. Flu shot today. Check with [name] about living will and advanced directives (I don't think we have this on file).

## 2011-06-09 NOTE — Assessment & Plan Note (Signed)
Chronic. Stable. Continue med.

## 2011-06-09 NOTE — Assessment & Plan Note (Signed)
Continue current replacement as vit D level in normal range.

## 2011-06-09 NOTE — Assessment & Plan Note (Signed)
Chronic.  Stable. Baseline Cr 1.7, this past week Cr 1.95. Discussed avoidance of NSAIDs, ensure hydration.

## 2011-06-09 NOTE — Progress Notes (Signed)
Subjective:    Patient ID: [name], male    DOB: May 12, 1931, 77 y.o.   MRN: [id number]  HPI CC: CPE today  Didn't fill out form for medicare wellness visit.  Incontinence: Taking oxybutynin twice daily, only receiving once daily (there was some confusion about XL vs short acting.  States has short acting at home and this is the one he wants.  No dry mouth, significant constipation or urinary retention.  Working well for him at current dose.  GERD - well controlled with prilosec.  Only gets reflux when overeating.  H/o lactose intolerance.  50th wedding anniversary coming up 04/2012.  Requests refills of meds.  Preventative: Tetanus 2003 Shingles shot done. Has had pneumonia shot in past. Would like flu shot today. Prostate exam - once elevated, thinks 1 year ago.  Would like to continue screening for now. Colonoscopy - done 07/2010. 2 tubular adenomas.  Probably rpt 5 yrs - 07/2016. Last vision screen - thinks 1 year ago.  Had cataracts removed last year (jan and march 2011). Discussed advanced directives - pt states has set up living will and HCPOA.  Medications and allergies reviewed and updated in chart.  Past histories reviewed and updated if relevant as below. Patient Active Problem List  Diagnoses  . HSV  . VITAMIN B12 DEFICIENCY  . UNSPECIFIED VITAMIN D DEFICIENCY  . THROMBOCYTOPENIA, PRIMARY NOS  . HYPERTENSION, BENIGN ESSENTIAL  . CORONARY ARTERY DISEASE  . EMBOLISM & INFARCTION, IATROGENIC PULMONARY  . THROMBOPHLEBITIS  . RENAL INSUFFICIENCY, MILD  . CONTACT DERMATITIS DUE TO POISON [place]  . DRY SKIN  . HIP PAIN, LEFT  . LOW BACK PAIN, ACUTE  . OTHER MALAISE AND FATIGUE  . TREMOR  . UNSTEADY GAIT  . DIARRHEA  . INCONTINENCE, URGE  . HYPERGLYCEMIA  . PSA, INCREASED  . RENAL CALCULUS, HX OF  . COUGH  . Medicare annual wellness visit, initial   Past Medical History  Diagnosis Date  . GERD (gastroesophageal reflux disease)   . CAD (coronary artery  disease)     normal nuclear stress test 12/03/2010, no evidence ischemia  . Renal insufficiency     baseline Cr 1.7  . Depression   . Hx pulmonary embolism 08/1991    coumadin  . History of phlebitis   . Allergic rhinitis   . Urge incontinence   . Elevated PSA     previous-normalized (followed by Dr. Adelfa Koh no repeat unless urinary sxs)  . Anemia    Past Surgical History  Procedure Date  . Tonsillectomy 1965  . Hernia repair 1979    Left  . Cystoscopy 1992    for kidney stones  . Exploratory laparotomy 1996    with incidental appendectomy  . Appendectomy 1996  . Cardiac catheterization 2000    30% one vessel  . Knee arthroscopy 03/24/02    Right (Dr. Gerrit Heck)  . Orif femoral neck fracture w/ dhs 08/03/02    Right (Dr. Gerrit Heck)  . Ct abd w & pelvis wo cm 07/2001    Scarring of right lung, stable negative o/w  . Ct abd w & pelvis wo cm 11/2000    ? stones, right LL scarring  . V/q scan 06/1999    negative  . [place]oscopy 1998    [place]  . US echocardiography 12/28/2007    Mild aortic valve calcification EF 55%, basically nml  . Carotid u/s 12/28/2007    nml  . Eeg 11/12/2009    nml  .  Mri 11/2009    Head, nml   History  Substance Use Topics  . Smoking status: Never Smoker   . Smokeless tobacco: Not on file  . Alcohol Use: No   Family History  Problem Relation Age of Onset  . Stroke Mother   . Hypertension Mother   . Cancer Brother     prostate with mets  . Blindness Brother     legally  . Diabetes Brother   . Pulmonary embolism Sister     from shoulder operation  . Alcohol abuse Brother    Allergies  Allergen Reactions  . Nizatidine     REACTION: Rash (Axid)  . Penicillins     REACTION: Rash  . Sulfadiazine     REACTION: Hives   Current Outpatient Prescriptions on File Prior to Visit  Medication Sig Dispense Refill  . cholecalciferol (VITAMIN D) 1000 UNITS tablet Take 1,000 Units by mouth daily.       . Coenzyme Q10 (COQ-10) 100 MG CAPS Take  1 capsule by mouth at bedtime.       . cyanocobalamin 2000 MCG tablet Take 2,000 mcg by mouth every other day.        . cyclobenzaprine (FLEXERIL) 10 MG tablet Take 5-10 mg by mouth 3 (three) times daily as needed. For muscle spasm/pain       . fluticasone (FLONASE) 50 MCG/ACT nasal spray 1 spray by Nasal route 2 (two) times daily.        Marland Kitchen loperamide (IMODIUM A-D) 2 MG tablet Take 2 mg by mouth 4 (four) times daily as needed.        . valACYclovir (VALTREX) 1000 MG tablet Take 1,000 mg by mouth as needed.        . warfarin (COUMADIN) 5 MG tablet Take 5 mg by mouth as directed.        . Prenatal Multivit-Min-Fe-FA (PRENATAL VITAMINS) 0.8 MG tablet Take 1 tablet by mouth daily.         Review of Systems  Constitutional: Negative for fever, chills, activity change, appetite change, fatigue and unexpected weight change.  HENT: Negative for hearing loss and neck pain.   Eyes: Negative for visual disturbance.  Respiratory: Positive for cough. Negative for chest tightness, shortness of breath and wheezing.   Cardiovascular: Negative for chest pain, palpitations and leg swelling.  Gastrointestinal: Negative for nausea, vomiting, abdominal pain, diarrhea, constipation, blood in stool and abdominal distention.  Genitourinary: Negative for hematuria and difficulty urinating.  Musculoskeletal: Negative for myalgias and arthralgias.  Skin: Negative for rash.  Neurological: Negative for dizziness, seizures, syncope and headaches.  Hematological: Does not bruise/bleed easily.  Psychiatric/Behavioral: Negative for dysphoric mood. The patient is not nervous/anxious.        Objective:   Physical Exam  Nursing note and vitals reviewed. Constitutional: He is oriented to person, place, and time. He appears well-developed and well-nourished. No distress.  HENT:  Head: Normocephalic and atraumatic.  Right Ear: External ear normal.  Left Ear: External ear normal.  Nose: Nose normal.  Mouth/Throat:  Oropharynx is clear and moist. No oropharyngeal exudate.  Eyes: Conjunctivae and EOM are normal. Pupils are equal, round, and reactive to light. No scleral icterus.  Neck: Normal range of motion. Neck supple. No thyromegaly present.  Cardiovascular: Normal rate, regular rhythm, normal heart sounds and intact distal pulses.   No murmur heard. Pulses:      Radial pulses are 2+ on the right side, and 2+ on the left side.  Pulmonary/Chest:  Effort normal and breath sounds normal. No respiratory distress. He has no wheezes. He has no rales.  Abdominal: Soft. Bowel sounds are normal. He exhibits no distension and no mass. There is no tenderness. There is no rebound and no guarding.  Genitourinary: Rectum normal and prostate normal. Rectal exam shows no external hemorrhoid, no internal hemorrhoid, no fissure, no mass, no tenderness and anal tone normal. Prostate is not enlarged and not tender.  Musculoskeletal: Normal range of motion. He exhibits no edema.  Lymphadenopathy:    He has no cervical adenopathy.  Neurological: He is alert and oriented to person, place, and time.       CN grossly intact, station and gait intact  Skin: Skin is warm and dry. No rash noted.  Psychiatric: He has a normal mood and affect. His behavior is normal. Judgment and thought content normal.          Assessment & Plan:

## 2011-06-09 NOTE — Assessment & Plan Note (Signed)
Chronic coumadin.  Continue.

## 2011-07-03 ENCOUNTER — Ambulatory Visit (INDEPENDENT_AMBULATORY_CARE_PROVIDER_SITE_OTHER): Payer: Medicare Other | Admitting: Family Medicine

## 2011-07-03 DIAGNOSIS — Z7901 Long term (current) use of anticoagulants: Secondary | ICD-10-CM

## 2011-07-03 DIAGNOSIS — I82409 Acute embolism and thrombosis of unspecified deep veins of unspecified lower extremity: Secondary | ICD-10-CM

## 2011-07-03 DIAGNOSIS — Z5181 Encounter for therapeutic drug level monitoring: Secondary | ICD-10-CM

## 2011-07-03 LAB — POCT INR: INR: 2.1

## 2011-07-03 NOTE — Patient Instructions (Signed)
3 mg daily ( increased dose by 1.5 mg weekly)recheck 2 weeks

## 2011-07-17 ENCOUNTER — Ambulatory Visit (INDEPENDENT_AMBULATORY_CARE_PROVIDER_SITE_OTHER): Payer: Medicare Other | Admitting: Family Medicine

## 2011-07-17 ENCOUNTER — Encounter: Payer: Self-pay | Admitting: Family Medicine

## 2011-07-17 VITALS — BP 118/64 | HR 84 | Temp 98.4°F | Wt 178.1 lb

## 2011-07-17 DIAGNOSIS — Z5181 Encounter for therapeutic drug level monitoring: Secondary | ICD-10-CM

## 2011-07-17 DIAGNOSIS — R21 Rash and other nonspecific skin eruption: Secondary | ICD-10-CM

## 2011-07-17 DIAGNOSIS — Z7901 Long term (current) use of anticoagulants: Secondary | ICD-10-CM

## 2011-07-17 DIAGNOSIS — I82409 Acute embolism and thrombosis of unspecified deep veins of unspecified lower extremity: Secondary | ICD-10-CM

## 2011-07-17 LAB — POCT INR: INR: 3.1

## 2011-07-17 MED ORDER — METOPROLOL TARTRATE 25 MG PO TABS: 25.0000 mg | ORAL_TABLET | Freq: Two times a day (BID) | ORAL | Status: DC

## 2011-07-17 NOTE — Progress Notes (Signed)
  Subjective:    Patient ID: [name], male    DOB: 13-Sep-1930, 77 y.o.   MRN: [id number]  HPI CC: rash  Mr. Feggins presents today with 2wk h/o abdominal rash.  Also significant itching throughout abd, back, sides, legs.  Rash started abdomen.    No fevers/chills, nausea, vomiting, diarrhea, abdominal pain, new joint pains, oral lesions. No new lotions, detergents, soaps, shampoos, medicines, foods.  No one at home with similar rash.  Wears pajamas to bedtime.  Did change body wash recently - started new Dove body wash 1 mo ago.  Not fragranced.  Requests to up metoprolol to 1 bid for tremor.  Review of Systems Per HPI    Objective:   Physical Exam  Nursing note and vitals reviewed. Constitutional: He appears well-developed and well-nourished. No distress.  HENT:  Head: Normocephalic and atraumatic.  Skin: Skin is warm and dry. Rash noted.       Faint diffuse papular erythematous rash on upper abd band like distribution with excoriations from scratching - spares lower abdomen.  pruritis throughout abd, back, legs and arms, no rash.  Isolated red macules on back       Assessment & Plan:

## 2011-07-17 NOTE — Assessment & Plan Note (Signed)
Unclear etiology. Does not seem infectious, no systemic sxs. rec sarna cream, back off body wash, benadryl at night time for itching. Update me if worsening or not improving as expected.

## 2011-07-17 NOTE — Patient Instructions (Addendum)
I'm not sure where this rash is coming from. I'd like you to change back to dove soap instead of body wash. I'd also like you to wash all bedding.  (I doubt bed bugs but this is something that may help). I recommend Sarna cream for itch, may use benadryl 25mg , start with 1/2 pill, only use at bedtime to see if can help with itching.  Don't take during day. Call me if not improving as expected or if worsening, spreading.

## 2011-07-17 NOTE — Patient Instructions (Signed)
Continue current dose, check in 4 weeks  

## 2011-08-14 ENCOUNTER — Ambulatory Visit (INDEPENDENT_AMBULATORY_CARE_PROVIDER_SITE_OTHER): Payer: Medicare Other | Admitting: Family Medicine

## 2011-08-14 DIAGNOSIS — Z5181 Encounter for therapeutic drug level monitoring: Secondary | ICD-10-CM

## 2011-08-14 DIAGNOSIS — I82409 Acute embolism and thrombosis of unspecified deep veins of unspecified lower extremity: Secondary | ICD-10-CM

## 2011-08-14 DIAGNOSIS — Z7901 Long term (current) use of anticoagulants: Secondary | ICD-10-CM

## 2011-08-14 LAB — POCT INR: INR: 2.7

## 2011-08-14 NOTE — Patient Instructions (Signed)
Continue current dose, check in 4 weeks  

## 2011-09-11 ENCOUNTER — Ambulatory Visit (INDEPENDENT_AMBULATORY_CARE_PROVIDER_SITE_OTHER): Payer: Medicare Other | Admitting: Family Medicine

## 2011-09-11 DIAGNOSIS — Z5181 Encounter for therapeutic drug level monitoring: Secondary | ICD-10-CM | POA: Diagnosis not present

## 2011-09-11 DIAGNOSIS — I82409 Acute embolism and thrombosis of unspecified deep veins of unspecified lower extremity: Secondary | ICD-10-CM | POA: Diagnosis not present

## 2011-09-11 DIAGNOSIS — Z7901 Long term (current) use of anticoagulants: Secondary | ICD-10-CM | POA: Diagnosis not present

## 2011-09-11 NOTE — Patient Instructions (Signed)
Continue current dose, check in 4 weeks  

## 2011-09-19 ENCOUNTER — Other Ambulatory Visit: Payer: Self-pay | Admitting: *Deleted

## 2011-09-19 MED ORDER — WARFARIN SODIUM 3 MG PO TABS: 3.0000 mg | ORAL_TABLET | ORAL | Status: DC

## 2011-09-19 NOTE — Telephone Encounter (Signed)
Patient has not received meds from [facility]. Has run out and needed a local refill until meds arrive.

## 2011-10-09 ENCOUNTER — Ambulatory Visit (INDEPENDENT_AMBULATORY_CARE_PROVIDER_SITE_OTHER): Payer: Medicare Other | Admitting: Family Medicine

## 2011-10-09 DIAGNOSIS — Z7901 Long term (current) use of anticoagulants: Secondary | ICD-10-CM | POA: Diagnosis not present

## 2011-10-09 DIAGNOSIS — Z5181 Encounter for therapeutic drug level monitoring: Secondary | ICD-10-CM

## 2011-10-09 LAB — POCT INR: INR: 2.6

## 2011-10-09 NOTE — Patient Instructions (Signed)
Continue  3 mg daily , recheck 4 weeks 

## 2011-10-17 ENCOUNTER — Other Ambulatory Visit: Payer: Self-pay | Admitting: *Deleted

## 2011-10-17 MED ORDER — OXYBUTYNIN CHLORIDE 5 MG PO TABS: 5.0000 mg | ORAL_TABLET | Freq: Two times a day (BID) | ORAL | Status: DC

## 2011-10-21 ENCOUNTER — Ambulatory Visit: Payer: Medicare Other | Admitting: Family Medicine

## 2011-11-03 ENCOUNTER — Ambulatory Visit (INDEPENDENT_AMBULATORY_CARE_PROVIDER_SITE_OTHER): Payer: Medicare Other | Admitting: Family Medicine

## 2011-11-03 DIAGNOSIS — Z5181 Encounter for therapeutic drug level monitoring: Secondary | ICD-10-CM

## 2011-11-03 DIAGNOSIS — Z7901 Long term (current) use of anticoagulants: Secondary | ICD-10-CM | POA: Diagnosis not present

## 2011-11-03 DIAGNOSIS — I82409 Acute embolism and thrombosis of unspecified deep veins of unspecified lower extremity: Secondary | ICD-10-CM

## 2011-11-03 LAB — POCT INR: INR: 2.9

## 2011-11-03 NOTE — Patient Instructions (Signed)
Continue current dose, check in 4 weeks  

## 2011-11-20 DIAGNOSIS — L821 Other seborrheic keratosis: Secondary | ICD-10-CM | POA: Diagnosis not present

## 2011-11-20 DIAGNOSIS — D485 Neoplasm of uncertain behavior of skin: Secondary | ICD-10-CM | POA: Diagnosis not present

## 2011-11-20 DIAGNOSIS — L570 Actinic keratosis: Secondary | ICD-10-CM | POA: Diagnosis not present

## 2011-11-20 DIAGNOSIS — L820 Inflamed seborrheic keratosis: Secondary | ICD-10-CM | POA: Diagnosis not present

## 2011-12-08 ENCOUNTER — Ambulatory Visit (INDEPENDENT_AMBULATORY_CARE_PROVIDER_SITE_OTHER): Payer: Medicare Other | Admitting: Family Medicine

## 2011-12-08 ENCOUNTER — Encounter: Payer: Self-pay | Admitting: Family Medicine

## 2011-12-08 VITALS — BP 110/62 | HR 72 | Temp 98.8°F | Wt 177.8 lb

## 2011-12-08 DIAGNOSIS — N259 Disorder resulting from impaired renal tubular function, unspecified: Secondary | ICD-10-CM

## 2011-12-08 DIAGNOSIS — E785 Hyperlipidemia, unspecified: Secondary | ICD-10-CM | POA: Diagnosis not present

## 2011-12-08 DIAGNOSIS — I10 Essential (primary) hypertension: Secondary | ICD-10-CM

## 2011-12-08 DIAGNOSIS — I2510 Atherosclerotic heart disease of native coronary artery without angina pectoris: Secondary | ICD-10-CM

## 2011-12-08 DIAGNOSIS — F331 Major depressive disorder, recurrent, moderate: Secondary | ICD-10-CM | POA: Insufficient documentation

## 2011-12-08 DIAGNOSIS — Z5181 Encounter for therapeutic drug level monitoring: Secondary | ICD-10-CM

## 2011-12-08 DIAGNOSIS — I82409 Acute embolism and thrombosis of unspecified deep veins of unspecified lower extremity: Secondary | ICD-10-CM

## 2011-12-08 DIAGNOSIS — Z7901 Long term (current) use of anticoagulants: Secondary | ICD-10-CM

## 2011-12-08 DIAGNOSIS — F329 Major depressive disorder, single episode, unspecified: Secondary | ICD-10-CM

## 2011-12-08 LAB — BASIC METABOLIC PANEL
CO2: 28 mEq/L (ref 19–32)
Calcium: 9.5 mg/dL (ref 8.4–10.5)
GFR: 43.05 mL/min — ABNORMAL LOW (ref >60.00)
Sodium: 141 mEq/L (ref 135–145)

## 2011-12-08 LAB — POCT INR: INR: 2.8

## 2011-12-08 MED ORDER — VENLAFAXINE HCL 75 MG PO TABS: 75.0000 mg | ORAL_TABLET | Freq: Two times a day (BID) | ORAL | Status: DC

## 2011-12-08 NOTE — Assessment & Plan Note (Signed)
With recent SI but denies current SI. Has been on effexor longstanding. Provided support. Start with increase in effexor to 75mg  bid, return in 1 mo for f/u. Discussed counseling, pt wants to start with increased effexor first.

## 2011-12-08 NOTE — Patient Instructions (Signed)
Continue  3 mg daily , recheck 4 weeks 

## 2011-12-08 NOTE — Assessment & Plan Note (Signed)
Chronic, stable. Continue metoprolol. 

## 2011-12-08 NOTE — Progress Notes (Addendum)
Subjective:    Patient ID: [name], male    DOB: 1931/07/15, 78 y.o.   MRN: [id number]  HPI CC: 6 mo f/u  No questions or concerns today.  Saw derm recently, had several AKs frozen, one precancerous lesion on chest shaved.  Continues to itch on legs and back - wakes him up at night.  Using sarna cream which helps.  Tried bluestar as well.  HTN - No CP/tightness, SOB, leg swelling.  Compliant with meds.  Having HA and vision changes - wonders if due to being tired.  May be due for vision screen.  HA described as unilateral frontal pain that alternates sides.  Tylenol helps.  Feeling more congested.  Not recently using flonase. (ADDENDUM - no dx HTN, removed from problem list)  CRI - last Cr 1.9.  CAD - normal stress test 11/2010, no ischemia.  Cath 2000 with single vessel 30% disease.  GERD - stable on aciphex.  Had 1 fall 3 wks ago, when going up steps of house.  Bruised shoulder.  No dizziness prior.  No falls otherwise. Denies anhedonia.  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   + SI in past "to get away from it all".  Upon further discussion, notes things worse since wife retired, feels she expects too much out of him, he notes that with age he has slowed down and is unable to do things that he used to be able to do.  Has seen counselor in past, did help.  Wt Readings from Last 3 Encounters:  12/08/11 177 lb 12 oz (80.627 kg)  07/17/11 178 lb 1.9 oz (80.795 kg)  06/09/11 179 lb 8 oz (81.421 kg)     Past Medical History  Diagnosis Date  . GERD (gastroesophageal reflux disease)   . CAD (coronary artery disease)     normal nuclear stress test 12/03/2010, no evidence ischemia  . Renal insufficiency     baseline Cr 1.7  . Depression   . Hx pulmonary embolism 08/1991    coumadin  . History of phlebitis   . Allergic rhinitis   . Urge incontinence   . Elevated PSA     previous-normalized (followed by Dr. Adelfa Koh no repeat unless urinary sxs)  .  Anemia    Past Surgical History  Procedure Date  . Tonsillectomy 1965  . Hernia repair 1979    Left  . Cystoscopy 1992    for kidney stones  . Exploratory laparotomy 1996    with incidental appendectomy  . Appendectomy 1996  . Cardiac catheterization 2000    30% one vessel  . Knee arthroscopy 03/24/02    Right (Dr. Gerrit Heck)  . Orif femoral neck fracture w/ dhs 08/03/02    Right (Dr. Gerrit Heck)  . Ct abd w & pelvis wo cm 07/2001    Scarring of right lung, stable negative o/w  . Ct abd w & pelvis wo cm 11/2000    ? stones, right LL scarring  . V/q scan 06/1999    negative  . Colonoscopy 1998    [place]  . US echocardiography 12/28/2007    Mild aortic valve calcification EF 55%, basically nml  . Carotid u/s 12/28/2007    nml  . Eeg 11/12/2009    nml  . Mri 11/2009    Head, nml  . Cataract extraction Jan, March 2011    bilateral  . Colonoscopy 07/2010    5 polyps, adenomatous, rec rpt 3 yrs  Review of Systems Per HPI    Objective:   Physical Exam  Nursing note and vitals reviewed. Constitutional: He appears well-developed and well-nourished. No distress.  HENT:  Head: Normocephalic and atraumatic.  Mouth/Throat: Oropharynx is clear and moist. No oropharyngeal exudate.  Eyes: Conjunctivae and EOM are normal. Pupils are equal, round, and reactive to light. No scleral icterus.  Neck: Normal range of motion. Neck supple. Carotid bruit is not present.  Cardiovascular: Normal rate, regular rhythm, normal heart sounds and intact distal pulses.   No murmur heard. Pulmonary/Chest: Breath sounds normal. No respiratory distress. He has no wheezes. He has no rales.  Musculoskeletal: He exhibits no edema.  Skin: Skin is warm and dry. No rash noted.  Psychiatric: He has a normal mood and affect.      Assessment & Plan:

## 2011-12-08 NOTE — Assessment & Plan Note (Signed)
Chronic, stable. Continue lipitor and tricor. Lab Results  Component Value Date   LDLCALC 84 06/02/2011

## 2011-12-08 NOTE — Assessment & Plan Note (Signed)
Chronic. creatinine bumped last visit- recheck today.

## 2011-12-08 NOTE — Patient Instructions (Signed)
Check to see if you've had pneumonia shot done before. Schedule vision screen if due. Return in 6 months for annual. Return in 1 month for follow up.  Increase effexor to 75mg  twice daily.  Call me if things worsening instead of improving. Call us with questions.  Good to see you today.

## 2011-12-09 ENCOUNTER — Encounter: Payer: Self-pay | Admitting: Family Medicine

## 2011-12-11 DIAGNOSIS — H35389 Toxic maculopathy, unspecified eye: Secondary | ICD-10-CM | POA: Diagnosis not present

## 2011-12-27 ENCOUNTER — Ambulatory Visit (INDEPENDENT_AMBULATORY_CARE_PROVIDER_SITE_OTHER): Payer: Medicare Other | Admitting: Family Medicine

## 2011-12-27 VITALS — BP 158/62 | Temp 99.6°F | Wt 134.0 lb

## 2011-12-27 DIAGNOSIS — J029 Acute pharyngitis, unspecified: Secondary | ICD-10-CM | POA: Diagnosis not present

## 2011-12-27 LAB — POCT RAPID STREP A (OFFICE): Rapid Strep A Screen: NEGATIVE

## 2011-12-27 NOTE — Patient Instructions (Signed)

## 2011-12-27 NOTE — Progress Notes (Addendum)
  Subjective:    Patient ID: [name], male    DOB: October 17, 1930, 78 y.o.   MRN: [id number]  HPI Started feeling bad 2 days ago.  Low grade fever today.  ST and is painful and hard to eat.  Pain is 11/10.  Feels like something is cutting his throat. No other URI sxs. No GI severe.  Using Tylneol for pain - helps some. No exposure to strep throat.  Mild earache on the right.     Review of Systems     Objective:   Physical Exam  Constitutional: He is oriented to person, place, and time. He appears well-developed and well-nourished.  HENT:  Head: Normocephalic and atraumatic.  Right Ear: External ear normal.  Left Ear: External ear normal.  Nose: Nose normal.  Mouth/Throat: Oropharynx is clear and moist.       TMs and canals are clear.   Eyes: Conjunctivae and EOM are normal. Pupils are equal, round, and reactive to light.  Neck: Neck supple. No thyromegaly present.  Cardiovascular: Normal rate and normal heart sounds.   Pulmonary/Chest: Effort normal and breath sounds normal.  Lymphadenopathy:    He has no cervical adenopathy.  Neurological: He is alert and oriented to person, place, and time.  Skin: Skin is warm and dry.  Psychiatric: He has a normal mood and affect.          Assessment & Plan:  Viral pharyngitis - Rapid strep is neg.  Given reassurance. Reviewed symptomatic care. Call if any fever great than 101 or if just not better in 1-2 weeks Cal if not getting any better.

## 2011-12-30 ENCOUNTER — Ambulatory Visit (INDEPENDENT_AMBULATORY_CARE_PROVIDER_SITE_OTHER): Payer: Medicare Other | Admitting: Family Medicine

## 2011-12-30 ENCOUNTER — Encounter: Payer: Self-pay | Admitting: Family Medicine

## 2011-12-30 VITALS — BP 100/60 | HR 64 | Temp 98.4°F | Wt 172.8 lb

## 2011-12-30 DIAGNOSIS — K222 Esophageal obstruction: Secondary | ICD-10-CM | POA: Insufficient documentation

## 2011-12-30 DIAGNOSIS — R1310 Dysphagia, unspecified: Secondary | ICD-10-CM

## 2011-12-30 DIAGNOSIS — J029 Acute pharyngitis, unspecified: Secondary | ICD-10-CM | POA: Diagnosis not present

## 2011-12-30 MED ORDER — CEPHALEXIN 500 MG PO CAPS: 500.0000 mg | ORAL_CAPSULE | Freq: Two times a day (BID) | ORAL | Status: AC

## 2011-12-30 NOTE — Assessment & Plan Note (Signed)
pharyngouvulitis - treat with keflex in PCN allergy (pt states has tolerated keflex fine in past). Discussed should only mildly affect coumadin, has recheck in 1 wk.

## 2011-12-30 NOTE — Patient Instructions (Addendum)
You have continued pharyngitis and uvulitis - treat with course of keflex. Pass by Marion's office for referral to GI. Good to see you today, we will recheck you next week.  Uvulitis Uvulitis is redness and soreness (inflammation) of the uvula. The uvula is the small tongue-shaped piece of tissue in the back of your mouth.  CAUSES Infection is a common cause of uvulitis. Infection of the uvula can be either viral or bacterial. Infectious uvulitis usually only occurs in association with another condition, such as inflammation and infection of the mouth or throat. Other causes of uvulitis include:  Trauma to the uvula.   Swelling from excess fluid buildup (edema), which may be an allergic reaction.   Inhalation of irritants, such as chemical agents, smoke, or steam.  DIAGNOSIS Your caregiver can usually diagnose uvulitis through a physical examination. Bacterial uvulitis can be diagnosed through the results of the growth of samples of bodily substances taken from your mouth (cultures). HOME CARE INSTRUCTIONS   Rest as much as possible.   Young children may suck on frozen juice bars or frozen ice pops. Older children and adults may gargle with a warm or cold liquid to help soothe the throat. (Mix  tsp of salt in 8 oz of water, or use strong tea.)   Use a cool-mist humidifier to lessen throat irritation and cough.   Drink enough fluids to keep your urine clear or pale yellow.   While the throat is very sore, eat soft or liquid foods such as milk, ice cream, soups, or milk drinks.   Family members who develop a sore throat or fever should have a medical exam or throat culture.   If your child has uvulitis and is taking antibiotic medicine, wait 24 hours or until his or her temperature is near normal (less than 100 F [37.8 C]) before allowing him or her to return to school or day care.   Only take over-the-counter or prescription medicines for pain, discomfort, or fever as directed by  your caregiver.  Ask when your test results will be ready. Make sure you get your test results. SEEK MEDICAL CARE IF:   You have an oral temperature above 102 F (38.9 C).   You develop large, tender lumps your the neck.   Your child develops a rash.   You cough up green, yellow-brown, or bloody substances.  SEEK IMMEDIATE MEDICAL CARE IF:   You develop any new symptoms, such as vomiting, earache, severe headache, stiff neck, chest pain, or trouble breathing or swallowing.   Your airway is blocked.   You develop more severe throat pain along with drooling or voice changes.  Document Released: 04/04/2004 Document Revised: 08/14/2011 Document Reviewed: 10/31/2010 [facility] Patient Information 2012 [place], [place].

## 2011-12-30 NOTE — Progress Notes (Signed)
Subjective:    Patient ID: [name], male    DOB: 04/27/1931, 78 y.o.   MRN: [id number]  HPI CC: ST  6d h/o ST, worse this morning.  "Feels like someone's cut my throat when I swallow".  Stays constant.  Temp to 99.8 on Saturday at [place] [facility].  R ear ache present as well.  Seen at [facility] by Dr. Linford Arnold, thought viral pharyngitis.  Very mild cough.    Tylenol use during day.  Also feels food catches when swallows.  Even oatmeal catches.  Also with fluids.  Catching sensation a few seconds after swallowing.  Possible early satiety endorsed.  No significant abd pain, n/v.  Normal stools.  Denies coryza, rhinorrhea, tooth pain.  No recent endoscopy - h/o GERD, h/o remote ulcers.  No h/o esoph strictures.  On acid suppression for last 30 years.   Wt Readings from Last 3 Encounters:  12/30/11 172 lb 12 oz (78.359 kg)  12/27/11 134 lb (60.782 kg)  12/08/11 177 lb 12 oz (80.627 kg)  reading at Saturday clinic 4/20 was definitely error  Past Medical History  Diagnosis Date  . GERD (gastroesophageal reflux disease)   . CAD (coronary artery disease)     cath 2000 30% single vessel, normal nuclear stress test 12/03/2010, no evidence ischemia  . Renal insufficiency     baseline Cr 1.7  . Depression   . Hx pulmonary embolism 08/1991    coumadin  . History of phlebitis   . Allergic rhinitis   . Urge incontinence   . Elevated PSA     previous-normalized (followed by Dr. Adelfa Koh no repeat unless urinary sxs)  . Anemia   . HLD (hyperlipidemia)    Past Surgical History  Procedure Date  . Tonsillectomy 1965  . Hernia repair 1979    Left  . Cystoscopy 1992    for kidney stones  . Exploratory laparotomy 1996    with incidental appendectomy  . Appendectomy 1996  . Cardiac catheterization 2000    30% one vessel  . Knee arthroscopy 03/24/02    Right (Dr. Gerrit Heck)  . Orif femoral neck fracture w/ dhs 08/03/02    Right (Dr. Gerrit Heck)  . Ct abd w & pelvis wo cm 07/2001    Scarring  of right lung, stable negative o/w  . Ct abd w & pelvis wo cm 11/2000    ? stones, right LL scarring  . V/q scan 06/1999    negative  . [place]oscopy 1998    [place]  . US echocardiography 12/28/2007    Mild aortic valve calcification EF 55%, basically nml  . Carotid u/s 12/28/2007    nml  . Eeg 11/12/2009    nml  . Mri 11/2009    Head, nml  . Cataract extraction Jan, March 2011    bilateral  . Colonoscopy 07/2010    5 polyps, adenomatous, rec rpt 3 yrs    Review of Systems Per HPI    Objective:   Physical Exam  Nursing note and vitals reviewed. Constitutional: He appears well-developed and well-nourished. No distress.  HENT:  Head: Normocephalic and atraumatic.  Right Ear: Hearing, tympanic membrane, external ear and ear canal normal.  Left Ear: Hearing, tympanic membrane, external ear and ear canal normal.  Nose: Nose normal. No mucosal edema or rhinorrhea.  Mouth/Throat: Uvula is midline and mucous membranes are normal. Uvula swelling present. Oropharyngeal exudate and posterior oropharyngeal erythema present. No posterior oropharyngeal edema or tonsillar abscesses.  Slight fluid behind TM Thick white discharge in posterior oropharynx Erythematous/edematous uvula  Eyes: Conjunctivae and EOM are normal. Pupils are equal, round, and reactive to light. No scleral icterus.  Neck: Normal range of motion. Neck supple.  Cardiovascular: Normal rate, regular rhythm, normal heart sounds and intact distal pulses.   No murmur heard. Pulmonary/Chest: Effort normal and breath sounds normal. No respiratory distress. He has no wheezes. He has no rales.  Abdominal: Soft. Bowel sounds are normal. He exhibits no distension. There is no tenderness. There is no rebound and no guarding.  Musculoskeletal: He exhibits no edema.  Lymphadenopathy:    He has no cervical adenopathy.  Skin: Skin is warm and dry. No rash noted.       Assessment & Plan:

## 2011-12-30 NOTE — Assessment & Plan Note (Signed)
Sounds esophageal to both solids and liquids. No recent EGD.  Longstanding GERD. No weight loss.  ?very mild early satiety. Refer to GI for eval.

## 2012-01-06 ENCOUNTER — Ambulatory Visit (INDEPENDENT_AMBULATORY_CARE_PROVIDER_SITE_OTHER): Payer: Medicare Other | Admitting: Family Medicine

## 2012-01-06 ENCOUNTER — Encounter: Payer: Self-pay | Admitting: Family Medicine

## 2012-01-06 VITALS — BP 108/60 | HR 72 | Temp 98.2°F | Wt 173.5 lb

## 2012-01-06 DIAGNOSIS — N3941 Urge incontinence: Secondary | ICD-10-CM

## 2012-01-06 DIAGNOSIS — Z5181 Encounter for therapeutic drug level monitoring: Secondary | ICD-10-CM

## 2012-01-06 DIAGNOSIS — F3289 Other specified depressive episodes: Secondary | ICD-10-CM

## 2012-01-06 DIAGNOSIS — F329 Major depressive disorder, single episode, unspecified: Secondary | ICD-10-CM | POA: Diagnosis not present

## 2012-01-06 DIAGNOSIS — J029 Acute pharyngitis, unspecified: Secondary | ICD-10-CM

## 2012-01-06 DIAGNOSIS — I82409 Acute embolism and thrombosis of unspecified deep veins of unspecified lower extremity: Secondary | ICD-10-CM

## 2012-01-06 DIAGNOSIS — Z7901 Long term (current) use of anticoagulants: Secondary | ICD-10-CM

## 2012-01-06 LAB — POCT INR
INR: 3.6
INR: 3.6

## 2012-01-06 NOTE — Assessment & Plan Note (Addendum)
Per pt improved.  Back down to 75mg  daily. Continue to closely monitor. May have increased somnolence during day, but no problems with resting at nighttime.

## 2012-01-06 NOTE — Progress Notes (Signed)
  Subjective:    Patient ID: [name], male    DOB: Feb 17, 1931, 78 y.o.   MRN: [id number]  HPI CC: 1 mo f/u  Presents with wife who is worried that Roe Coombs is sleeping too much.  No trouble sleeping at night.  Activity wise - walks dog 2-3x/day about 1/2 mile.  Wt Readings from Last 3 Encounters:  01/06/12 173 lb 8 oz (78.699 kg)  12/30/11 172 lb 12 oz (78.359 kg)  12/27/11 134 lb (60.782 kg)    Seen here 12/08/2011 with worsening depression, effexor increased to 75mg  bid.  Actually this week self cut back to 1 pill a day - didn't feel needed higher dose.  Also cut back on oxybutynin to 1 pill a day because causing dry mouth.  Seen here last week with ST, dx with uvulitis/pharyngitis and treated with course of keflex.  Significant improvement in sxs.  Finishing keflex. Also referred to GI for dysphagia.  appt on Friday for this.  From visit 12/08/2011: Denies anhedonia. 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  + SI in past "to get away from it all". Upon further discussion, notes things worse since wife retired, feels she expects too much out of him, he notes that with age he has slowed down and is unable to do things that he used to be able to do. Has seen counselor in past, did help.   Past Medical History  Diagnosis Date  . GERD (gastroesophageal reflux disease)   . CAD (coronary artery disease)     cath 2000 30% single vessel, normal nuclear stress test 12/03/2010, no evidence ischemia  . Renal insufficiency     baseline Cr 1.7  . Depression   . Hx pulmonary embolism 08/1991    coumadin  . History of phlebitis   . Allergic rhinitis   . Urge incontinence   . Elevated PSA     previous-normalized (followed by Dr. Adelfa Koh no repeat unless urinary sxs)  . Anemia   . HLD (hyperlipidemia)     Review of Systems Per HPI    Objective:   Physical Exam  Nursing note and vitals reviewed. Constitutional: He appears well-developed and well-nourished. No  distress.       Wife helps him with transfer to exam table  HENT:  Head: Normocephalic and atraumatic.  Mouth/Throat: Oropharynx is clear and moist. No oropharyngeal exudate.  Eyes: Conjunctivae and EOM are normal. Pupils are equal, round, and reactive to light. No scleral icterus.  Cardiovascular: Normal rate, regular rhythm, normal heart sounds and intact distal pulses.   No murmur heard. Pulmonary/Chest: Effort normal and breath sounds normal. No respiratory distress. He has no wheezes. He has no rales.  Musculoskeletal: He exhibits no edema.  Skin: Skin is warm and dry. No rash noted.  Psychiatric: He has a normal mood and affect.       Assessment & Plan:

## 2012-01-06 NOTE — Patient Instructions (Signed)
Continue current dose, check in 4 weeks (eat greens)

## 2012-01-06 NOTE — Patient Instructions (Signed)
Good to see you today, call us with questions. May try allegra for allergies.  May hold flonase. Ok to use oxybutynin once daily, if worsening urge, let me know for trial of another medicine. Return in 5 months for medicare wellness check.  Good to see you today.

## 2012-01-06 NOTE — Assessment & Plan Note (Signed)
Improving.

## 2012-01-06 NOTE — Assessment & Plan Note (Signed)
Discussed different options - pt opts to wait to see how once daily oxybutynin affects him (hopeful for improvement in dry mouth with lower dose)

## 2012-01-06 NOTE — Progress Notes (Signed)
Ok to continue as is - but i'd like [place] to change goal from 2-3.  Thanks.

## 2012-01-09 ENCOUNTER — Ambulatory Visit: Payer: Medicare Other | Admitting: Gastroenterology

## 2012-01-20 ENCOUNTER — Encounter: Payer: Self-pay | Admitting: Gastroenterology

## 2012-01-20 ENCOUNTER — Ambulatory Visit (INDEPENDENT_AMBULATORY_CARE_PROVIDER_SITE_OTHER): Payer: Medicare Other | Admitting: Family Medicine

## 2012-01-20 ENCOUNTER — Ambulatory Visit (INDEPENDENT_AMBULATORY_CARE_PROVIDER_SITE_OTHER): Payer: Medicare Other | Admitting: Gastroenterology

## 2012-01-20 DIAGNOSIS — R1310 Dysphagia, unspecified: Secondary | ICD-10-CM | POA: Diagnosis not present

## 2012-01-20 DIAGNOSIS — Z7901 Long term (current) use of anticoagulants: Secondary | ICD-10-CM | POA: Diagnosis not present

## 2012-01-20 DIAGNOSIS — Z5181 Encounter for therapeutic drug level monitoring: Secondary | ICD-10-CM | POA: Diagnosis not present

## 2012-01-20 DIAGNOSIS — I82409 Acute embolism and thrombosis of unspecified deep veins of unspecified lower extremity: Secondary | ICD-10-CM

## 2012-01-20 LAB — POCT INR: INR: 4.5

## 2012-01-20 NOTE — Progress Notes (Signed)
Review of pertinent gastrointestinal problems: 1. several adenomatous polyps removed from his colon November 2011, recommended for repeat colonoscopy at 3 year interval 2. chronic loose stools, colonoscopy November 2011 to the terminal ileum was normal except for polyps. Random colon biopsies showed no microscopic colitis   HPI: This is a   very pleasant 77 year old man whom I last saw about 2 years ago for chronic loose stools. See the workup above.  Loose stools intermittently. Will take imodium PRN.    New problem, had a sore throat recently.  Solid food dysphagia, going on for months, intermittent.  For 2-3 weeks became more constant.  Does not get pyrosis, takes aciphex daily.  Will relax and bolus usually passed.  He has lost weight, wife cannot tell that he has lost much weight however.  THe sore throat is better, treated with keflex.  On coumadin for PE in 1992, strong FH of blood clots as well.  Review of systems: Pertinent positive and negative review of systems were noted in the above HPI section. Complete review of systems was performed and was otherwise normal.    Past Medical History  Diagnosis Date  . GERD (gastroesophageal reflux disease)   . CAD (coronary artery disease)     cath 2000 30% single vessel, normal nuclear stress test 12/03/2010, no evidence ischemia  . Renal insufficiency     baseline Cr 1.7  . Depression   . Hx pulmonary embolism 08/1991    coumadin  . History of phlebitis   . Allergic rhinitis   . Urge incontinence   . Elevated PSA     previous-normalized (followed by Dr. Adelfa Koh no repeat unless urinary sxs)  . Anemia   . HLD (hyperlipidemia)   . Asthma     remote    Past Surgical History  Procedure Date  . Tonsillectomy 1965  . Hernia repair 1979    Left  . Cystoscopy 1992    for kidney stones  . Exploratory laparotomy 1996    with incidental appendectomy  . Appendectomy 1996  . Cardiac catheterization 2000    30% one vessel  .  Knee arthroscopy 03/24/02    Right (Dr. Gerrit Heck)  . Orif femoral neck fracture w/ dhs 08/03/02    Right (Dr. Gerrit Heck)  . Ct abd w & pelvis wo cm 07/2001    Scarring of right lung, stable negative o/w  . Ct abd w & pelvis wo cm 11/2000    ? stones, right LL scarring  . V/q scan 06/1999    negative  . Colonoscopy 1998    [place]  . US echocardiography 12/28/2007    Mild aortic valve calcification EF 55%, basically nml  . Carotid u/s 12/28/2007    nml  . Eeg 11/12/2009    nml  . Mri 11/2009    Head, nml  . Cataract extraction Jan, March 2011    bilateral  . Colonoscopy 07/2010    5 polyps, adenomatous, rec rpt 3 yrs    Current Outpatient Prescriptions  Medication Sig Dispense Refill  . atorvastatin (LIPITOR) 40 MG tablet Take 1 tablet (40 mg total) by mouth at bedtime.  90 tablet  3  . cholecalciferol (VITAMIN D) 1000 UNITS tablet Take 1,000 Units by mouth daily.       . Coenzyme Q10 (COQ-10) 100 MG CAPS Take 1 capsule by mouth at bedtime.       . cyanocobalamin 2000 MCG tablet Take 2,000 mcg by mouth every other day.        Marland Kitchen  fenofibrate (TRICOR) 145 MG tablet Take 1 tablet (145 mg total) by mouth daily.  90 tablet  3  . fexofenadine (ALLEGRA) 180 MG tablet Take 180 mg by mouth daily.      . fluticasone (FLONASE) 50 MCG/ACT nasal spray Place 1 spray into the nose 2 (two) times daily as needed.       . loperamide (IMODIUM A-D) 2 MG tablet Take 2 mg by mouth 4 (four) times daily as needed.        . metoprolol tartrate (LOPRESSOR) 25 MG tablet Take 1 tablet (25 mg total) by mouth 2 (two) times daily.  90 tablet    . Multiple Vitamins-Minerals (MULTIVITAMIN PO) Take 1 tablet by mouth daily.      Marland Kitchen oxybutynin (DITROPAN) 5 MG tablet Take 5 mg by mouth daily.      . RABEprazole (ACIPHEX) 20 MG tablet Take 1 tablet (20 mg total) by mouth daily.  90 tablet  3  . valACYclovir (VALTREX) 1000 MG tablet Take 1,000 mg by mouth as needed.        . venlafaxine (EFFEXOR) 75 MG tablet Take 75 mg  by mouth daily.      Marland Kitchen warfarin (COUMADIN) 3 MG tablet Take 1 tablet (3 mg total) by mouth as directed.  30 tablet  0  . warfarin (COUMADIN) 5 MG tablet Take 5 mg by mouth as directed.          Allergies as of 01/20/2012 - Review Complete 01/20/2012  Allergen Reaction Noted  . Nizatidine    . Penicillins    . Sulfadiazine      Family History  Problem Relation Age of Onset  . Stroke Mother   . Hypertension Mother   . Cancer Brother     prostate with mets  . Blindness Brother     legally  . Diabetes Brother   . Pulmonary embolism Sister     from shoulder operation  . Alcohol abuse Brother     History   Social History  . Marital Status: Married    Spouse Name: N/A    Number of Children: 2  . Years of Education: N/A   Occupational History  . 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 Smoking status: Never Smoker   . Smokeless tobacco: Never Used  . Alcohol Use: No  . Drug Use: No  . Sexually Active: Not on file   Other Topics Concern  . Not on file   Social History Narrative   Married with 2 children       Physical Exam: BP 122/60  Pulse 70  Ht 5\' 7"  (1.702 m)  Wt 171 lb 6.4 oz (77.747 kg)  BMI 26.85 kg/m2 Constitutional: generally well-appearing Psychiatric: alert and oriented x3 Eyes: extraocular movements intact Mouth: oral pharynx moist, no lesions Neck: supple no lymphadenopathy Cardiovascular: heart regular rate and rhythm Lungs: clear to auscultation bilaterally Abdomen: soft, nontender, nondistended, no obvious ascites, no peritoneal signs, normal bowel sounds Extremities: no lower extremity edema bilaterally Skin: no lesions on visible extremities    Assessment and plan: 78 y.o. male with  2-3 months of solid food dysphagia, on chronic Coumadin  He will first get a barium esophagram to assess the need for holding his Coumadin prior to EGD. If a narrowing is noted by barium esophagram then he will  hold his Coumadin to allow safe dilation. If no narrowing is noted that I will proceed with EGD while  he stays on Coumadin.

## 2012-01-20 NOTE — Patient Instructions (Addendum)
Barium esophagram for dysphagia (radiology test).  [name] please arrive at 01/22/12 1015 am for a 1030 appointment.  There is no preparation for this test. Will likely proceed with EGD after the barium esophagram, if a narrowing is seen then coumadin will have be held prior to EGD.

## 2012-01-20 NOTE — Patient Instructions (Signed)
Hold x 2 days, then 3 mg daily, 1.5mg   Mon, Thur check [employer]

## 2012-01-22 ENCOUNTER — Ambulatory Visit (HOSPITAL_COMMUNITY)
Admission: RE | Admit: 2012-01-22 | Discharge: 2012-01-22 | Disposition: A | Discharge status: Home / Self Care | Payer: Medicare Other | Source: Ambulatory Visit | Attending: Gastroenterology | Admitting: Gastroenterology

## 2012-01-22 ENCOUNTER — Telehealth: Payer: Self-pay | Admitting: Gastroenterology

## 2012-01-22 DIAGNOSIS — K228 Other specified diseases of esophagus: Secondary | ICD-10-CM | POA: Diagnosis not present

## 2012-01-22 DIAGNOSIS — K224 Dyskinesia of esophagus: Secondary | ICD-10-CM | POA: Diagnosis not present

## 2012-01-22 DIAGNOSIS — R1310 Dysphagia, unspecified: Secondary | ICD-10-CM | POA: Diagnosis not present

## 2012-01-22 NOTE — Telephone Encounter (Signed)
Pt's wife was told that the pt will remain on coumadin as directed by his cardiologist.  Dr Christella Hartigan will decide on coumadin hold or not after reviewing the Barium esophagram.  Pt's wife agreed

## 2012-01-23 ENCOUNTER — Ambulatory Visit (INDEPENDENT_AMBULATORY_CARE_PROVIDER_SITE_OTHER): Payer: Medicare Other | Admitting: Family Medicine

## 2012-01-23 DIAGNOSIS — Z5181 Encounter for therapeutic drug level monitoring: Secondary | ICD-10-CM | POA: Diagnosis not present

## 2012-01-23 DIAGNOSIS — Z7901 Long term (current) use of anticoagulants: Secondary | ICD-10-CM

## 2012-01-23 DIAGNOSIS — I82409 Acute embolism and thrombosis of unspecified deep veins of unspecified lower extremity: Secondary | ICD-10-CM | POA: Diagnosis not present

## 2012-01-23 NOTE — Patient Instructions (Signed)
3 mg daily except 1.5 mg Wed, Sat recheck 1 week

## 2012-01-28 ENCOUNTER — Other Ambulatory Visit: Payer: Self-pay

## 2012-01-28 ENCOUNTER — Other Ambulatory Visit (HOSPITAL_COMMUNITY): Payer: Self-pay | Admitting: Gastroenterology

## 2012-01-28 NOTE — Progress Notes (Signed)
Pt has been scheduled for [facility] 02/05/12 [facility] arrive at 945 am no prep needed.  Pt has been notified of date and time of appt

## 2012-01-30 ENCOUNTER — Ambulatory Visit (INDEPENDENT_AMBULATORY_CARE_PROVIDER_SITE_OTHER): Payer: Medicare Other | Admitting: Family Medicine

## 2012-01-30 DIAGNOSIS — I82409 Acute embolism and thrombosis of unspecified deep veins of unspecified lower extremity: Secondary | ICD-10-CM | POA: Diagnosis not present

## 2012-01-30 DIAGNOSIS — Z5181 Encounter for therapeutic drug level monitoring: Secondary | ICD-10-CM

## 2012-01-30 DIAGNOSIS — Z7901 Long term (current) use of anticoagulants: Secondary | ICD-10-CM | POA: Diagnosis not present

## 2012-01-30 NOTE — Patient Instructions (Addendum)
Continue current dose, check in 2 weeks  

## 2012-02-04 ENCOUNTER — Ambulatory Visit: Payer: Medicare Other

## 2012-02-05 ENCOUNTER — Ambulatory Visit (HOSPITAL_COMMUNITY)
Admission: RE | Admit: 2012-02-05 | Discharge: 2012-02-05 | Disposition: A | Discharge status: Home / Self Care | Payer: Medicare Other | Source: Ambulatory Visit | Attending: Gastroenterology | Admitting: Gastroenterology

## 2012-02-05 DIAGNOSIS — J45909 Unspecified asthma, uncomplicated: Secondary | ICD-10-CM | POA: Insufficient documentation

## 2012-02-05 DIAGNOSIS — I2510 Atherosclerotic heart disease of native coronary artery without angina pectoris: Secondary | ICD-10-CM | POA: Insufficient documentation

## 2012-02-05 DIAGNOSIS — Z86711 Personal history of pulmonary embolism: Secondary | ICD-10-CM | POA: Diagnosis not present

## 2012-02-05 DIAGNOSIS — N189 Chronic kidney disease, unspecified: Secondary | ICD-10-CM | POA: Insufficient documentation

## 2012-02-05 DIAGNOSIS — R1310 Dysphagia, unspecified: Secondary | ICD-10-CM | POA: Insufficient documentation

## 2012-02-05 DIAGNOSIS — Z7901 Long term (current) use of anticoagulants: Secondary | ICD-10-CM | POA: Insufficient documentation

## 2012-02-05 DIAGNOSIS — K219 Gastro-esophageal reflux disease without esophagitis: Secondary | ICD-10-CM | POA: Insufficient documentation

## 2012-02-05 DIAGNOSIS — F329 Major depressive disorder, single episode, unspecified: Secondary | ICD-10-CM | POA: Diagnosis not present

## 2012-02-05 DIAGNOSIS — E785 Hyperlipidemia, unspecified: Secondary | ICD-10-CM | POA: Insufficient documentation

## 2012-02-05 DIAGNOSIS — F3289 Other specified depressive episodes: Secondary | ICD-10-CM | POA: Insufficient documentation

## 2012-02-05 NOTE — Procedures (Signed)
Objective Swallowing Evaluation: Modified Barium Swallowing Study  Patient Details  Name: [name] MRN: [id number] Date of Birth: 07-19-1931  Today's Date: 02/05/2012 Time: 1010-1050 SLP Time Calculation (min): 40 min  Past Medical History:  Past Medical History  Diagnosis Date  . GERD (gastroesophageal reflux disease)   . CAD (coronary artery disease)     cath 2000 30% single vessel, normal nuclear stress test 12/03/2010, no evidence ischemia  . Renal insufficiency     baseline Cr 1.7  . Depression   . Hx pulmonary embolism 08/1991    coumadin  . History of phlebitis   . Allergic rhinitis   . Urge incontinence   . Elevated PSA     previous-normalized (followed by Dr. Adelfa Koh no repeat unless urinary sxs)  . Anemia   . HLD (hyperlipidemia)   . Asthma     remote   Past Surgical History:  Past Surgical History  Procedure Date  . Tonsillectomy 1965  . Hernia repair 1979    Left  . Cystoscopy 1992    for kidney stones  . Exploratory laparotomy 1996    with incidental appendectomy  . Appendectomy 1996  . Cardiac catheterization 2000    30% one vessel  . Knee arthroscopy 03/24/02    Right (Dr. Gerrit Heck)  . Orif femoral neck fracture w/ dhs 08/03/02    Right (Dr. Gerrit Heck)  . Ct abd w & pelvis wo cm 07/2001    Scarring of right lung, stable negative o/w  . Ct abd w & pelvis wo cm 11/2000    ? stones, right LL scarring  . V/q scan 06/1999    negative  . Colonoscopy 1998    [place]  . US echocardiography 12/28/2007    Mild aortic valve calcification EF 55%, basically nml  . Carotid u/s 12/28/2007    nml  . Eeg 11/12/2009    nml  . Mri 11/2009    Head, nml  . Cataract extraction Jan, March 2011    bilateral  . Colonoscopy 07/2010    5 polyps, adenomatous, rec rpt 3 yrs   HPI:  78 yo male referred by Dr Christella Hartigan for MBS due to probable laryngeal penetration observed during esophagram 01/27/12 raising concern for aspiration.  Pt esophagram 01/27/12 also showed  findings c/w mild stricture at distal esophagus and immediately above GE, nonspecific esophageal motility d/o.  PMH + for GERD, renal insufficiency, depression, pulmonary embolism, phlebitis, allergic rhinitis, elevated PSA, HLD, anemia, asthma.  Previous testing includes MRI 11/2009 normal, EEG 11/12/2009 normal.  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  Pt states he takes metoprolol for tremor.  Per pt and spouse, pt has a 20 year h/o use of PPI (on and off) due to reflux symptoms.  Pt also has sore throat, that wife stated had resolved with tx of ABX.  Weight loss of unknown amount reported, and pt with intentional weight loss in the past of 50 pounds resulting in resolution of sleep apnea per spouse.  No recent pneumonias reported.  Problems choking on liquids more than foods reported  by pt with sensation of stasis in esophagus requiring pt to wait for clearance.       Assessment / Plan / Recommendation Clinical Impression  Dysphagia Diagnosis: Moderate pharyngeal phase dysphagia;Suspected primary esophageal dysphagia;Mild cervical esophageal phase dysphagia Clinical impression: Moderate pharyngeal phase dysphagia characterized by decr epiglottic deflection/decr laryngeal elevation/closure with appearance of mechanical obstruction from  suspected cervical osteophytes C4-C6.   Mechanical obstruction results in trace laryngeal penetration and stasis in pharynx (primarily vallecular space) with intermittent sensation.  Reflexive dry and liquids swallows aided clearance.  Various postures including chin tuck, head flexion, head turn did not decr stasis nor prevent penetration.  No aspiration was observed but risk is present.  Pt observed to clear his throat throughout entire evaluation with and without barium intake.  Also noted when pt appeared with stasis throughout esophagus, he did not sense it or was referrantly sensed at pharynx.   Upon completion of test, after pt consumed bolus of water, he pointed to mid chest sensing stasis.  SlP questions component of weakness further impacting pharyngeal swallow, given pt's tremor, use of cane for walking, weak voice- ? source.  Advised pt and spouse to aspiration precautions and compensatory strategies.  Suspect primary dysphagia is esophageal, as already diagnosed. Thanks for this referral.        Treatment Recommendation       Diet Recommendation Dysphagia 3 (Mechanical Soft);Thin liquid (prefer water with meals due to pH neutral status of water)   Liquid Administration via: Straw;Cup Medication Administration: Crushed with puree (if not contraindicated, start with water and follow w/water) Postural Changes and/or Swallow Maneuvers: Seated upright 90 degrees;Upright 30-60 min after meal (several small meals/day)    Other  Recommendations Oral Care Recommendations: Oral care before and after PO   Follow Up Recommendations  None    Frequency and Duration        Pertinent Vitals/Pain Afebrile    SLP Swallow Goals     General Date of Onset: 02/05/12 HPI: 78 yo male referred by Dr Christella Hartigan for MBS due to probable laryngeal penetration observed during esophagram 01/27/12 raising concern for aspiration.  Pt esophagram 01/27/12 also showed findings c/w mild stricture at distal esophagus and immediately above GE, nonspecific esophageal motility d/o.  PMH + for GERD, renal insufficiency, depression, pulmonary embolism, phlebitis, allergic rhinitis, elevated PSA, HLD, anemia, asthma.  Previous testing includes MRI 11/2009 normal, EEG 11/12/2009 normal.  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  Pt states he takes metoprolol for tremor.  Per pt and spouse, pt has a 20 year h/o use of PPI (on and off) due to reflux symptoms.  Pt also has sore throat, that wife stated had resolved with tx of ABX.  Weight loss of unknown  amount reported, and pt with intentional weight loss in the past of 50 pounds resulting in resolution of sleep apnea per spouse.  No recent pneumonias reported.  Problems choking on liquids more than foods reported  by pt with sensation of stasis in esophagus requiring pt to wait for clearance.   Type of Study: Modified Barium Swallowing Study Reason for Referral: Objectively evaluate swallowing function Diet Prior to this Study: Regular;Thin liquids Temperature Spikes Noted: No Respiratory Status: Room air Behavior/Cognition: Alert;Cooperative;Pleasant mood Oral Cavity - Dentition: Adequate natural dentition Self-Feeding Abilities: Able to feed self Patient Positioning: Upright in chair Baseline Vocal Quality: Hoarse;Low vocal intensity (spouse reports progressive vocal weakness over several years) Volitional Swallow: Able to elicit Anatomy: Other (Comment)    Reason for Referral Objectively evaluate swallowing function   Oral Phase   Oral Phase:  WFL  Pharyngeal Phase Pharyngeal Phase: Impaired   Cervical Esophageal Phase Cervical Esophageal Phase: Impaired    Donavan Burnet, MS [facility] SLP [phone]

## 2012-02-06 ENCOUNTER — Other Ambulatory Visit: Payer: Self-pay

## 2012-02-06 MED ORDER — VALACYCLOVIR HCL 1 G PO TABS: ORAL_TABLET | ORAL | Status: DC

## 2012-02-06 NOTE — Telephone Encounter (Signed)
Message left notifying patient.

## 2012-02-06 NOTE — Telephone Encounter (Signed)
plz notify sent in. 

## 2012-02-06 NOTE — Telephone Encounter (Signed)
pts wife request refill Valtrex for fever blisters to [facility].Please advise.

## 2012-02-07 DIAGNOSIS — K222 Esophageal obstruction: Secondary | ICD-10-CM

## 2012-02-07 HISTORY — DX: Esophageal obstruction: K22.2

## 2012-02-07 HISTORY — PX: ESOPHAGOGASTRODUODENOSCOPY: SHX1529

## 2012-02-10 ENCOUNTER — Other Ambulatory Visit: Payer: Self-pay

## 2012-02-12 ENCOUNTER — Ambulatory Visit (INDEPENDENT_AMBULATORY_CARE_PROVIDER_SITE_OTHER): Payer: Medicare Other | Admitting: Family Medicine

## 2012-02-12 DIAGNOSIS — I4891 Unspecified atrial fibrillation: Secondary | ICD-10-CM | POA: Diagnosis not present

## 2012-02-12 DIAGNOSIS — Z5181 Encounter for therapeutic drug level monitoring: Secondary | ICD-10-CM | POA: Diagnosis not present

## 2012-02-12 DIAGNOSIS — Z7901 Long term (current) use of anticoagulants: Secondary | ICD-10-CM

## 2012-02-12 LAB — POCT INR: INR: 2.8

## 2012-02-12 NOTE — Patient Instructions (Signed)
3 mg daily except 1.5 mg Tues, Sat 4 weeks

## 2012-02-13 ENCOUNTER — Ambulatory Visit: Payer: Medicare Other

## 2012-02-16 ENCOUNTER — Telehealth: Payer: Self-pay | Admitting: Family Medicine

## 2012-02-16 NOTE — Telephone Encounter (Addendum)
Received a letter from GI - please notify [name] (I believe Dr. Larae Grooms CMA) and patient that I agree with holding coumadin for 5 days prior to procedure, then restart coumadin on night of procedure. To come in 5 days after restarting coumadin for INR check. (See their letter)

## 2012-02-16 NOTE — Telephone Encounter (Signed)
Spoke with patient's wife and notified her to have patient hold coumadin x 5 days prior to procedure and to restart it the night of the procedure. Protime appt scheduled for 5 days after restart. Message forwarded to Chales Abrahams.

## 2012-02-18 ENCOUNTER — Ambulatory Visit (INDEPENDENT_AMBULATORY_CARE_PROVIDER_SITE_OTHER): Payer: Medicare Other | Admitting: Family Medicine

## 2012-02-18 ENCOUNTER — Encounter: Payer: Self-pay | Admitting: Family Medicine

## 2012-02-18 VITALS — BP 110/60 | HR 64 | Temp 98.4°F | Resp 20 | Ht 67.0 in | Wt 177.2 lb

## 2012-02-18 DIAGNOSIS — R071 Chest pain on breathing: Secondary | ICD-10-CM

## 2012-02-18 DIAGNOSIS — R0789 Other chest pain: Secondary | ICD-10-CM | POA: Insufficient documentation

## 2012-02-18 DIAGNOSIS — R269 Unspecified abnormalities of gait and mobility: Secondary | ICD-10-CM | POA: Diagnosis not present

## 2012-02-18 MED ORDER — HYDROCODONE-ACETAMINOPHEN 5-500 MG PO TABS: 0.5000 | ORAL_TABLET | Freq: Three times a day (TID) | ORAL | Status: AC | PRN

## 2012-02-18 NOTE — Progress Notes (Signed)
  Subjective:    Patient ID: [name], male    DOB: Jul 08, 1931, 78 y.o.   MRN: [id number]  HPI CC:R side pain  Saturday afternoon (4-5d ago) legs got tangled when taking off pants, fell over and hit dresser on right side as well as elbow.  Noticed bruise that day.  Pain present day of injury, but worsening recently.  Last night had worse pain, trouble sleeping.  No abd pain, n/v, chest pain, SOB.  Lab Results  Component Value Date   INR 2.8 02/12/2012   INR 2.3 01/30/2012   INR 1.8 01/23/2012   falls - has had one prior to this weekend's event.  Has completed imbalance training at [facility].  Uses cane consistently.  Review of Systems Per HPI    Objective:   Physical Exam  Nursing note and vitals reviewed. Constitutional: He appears well-developed and well-nourished. No distress.  Pulmonary/Chest: Effort normal and breath sounds normal. No respiratory distress. He has no wheezes. He has no rales.  Abdominal:       Some abd pain around hematoma, but not specifically any organ tenderness including liver, gallbladder, right kidney   Musculoskeletal:       Point tender to palpation several areas right ribcage anterior and laterally  Neurological:       5/5 strength BUE, BLE Walks slowed but steady, some imbalance when speeds up.  No slowed or shuffling gait.  No masked fascies  Skin: Skin is warm and dry. Bruising and ecchymosis noted. No rash noted.          Ecchymosis/hematoma right lateral side at ribcage Also smaller resolving ecchymoses on abdomen  Psychiatric: He has a normal mood and affect.       Assessment & Plan:

## 2012-02-18 NOTE — Assessment & Plan Note (Signed)
Anticipate bony bruising, doubt fracture as diffuse pain. Treat with continued tylenol, ice/heat, and vicodin for breakthrough pain. Update [place] if not improving as expected.

## 2012-02-18 NOTE — Patient Instructions (Addendum)
Looks like bony bruising to ribcage, possible fracture but won't change management. Treat with tylenol during day and vicodin for breakthrough pain.  Start with 1/2 pill, may take up to 1 pill at a time. Let me know if worsening pain, or spreading bruising. Good to see you today, call us with questions.

## 2012-02-18 NOTE — Assessment & Plan Note (Addendum)
Continued.  Knows to take it easy and slow. Has already completed physical therapy for imbalance. Knows to use cane.

## 2012-02-25 ENCOUNTER — Telehealth: Payer: Self-pay | Admitting: Family Medicine

## 2012-02-25 NOTE — Telephone Encounter (Signed)
Can we call [name] and touch base with her re coumadin?  I just received another request to respond but I believe we responded already.

## 2012-02-25 NOTE — Telephone Encounter (Signed)
I notified her again.

## 2012-02-26 ENCOUNTER — Ambulatory Visit (AMBULATORY_SURGERY_CENTER): Payer: Medicare Other | Admitting: *Deleted

## 2012-02-26 VITALS — Ht 67.0 in | Wt 170.0 lb

## 2012-02-26 DIAGNOSIS — K222 Esophageal obstruction: Secondary | ICD-10-CM

## 2012-03-01 ENCOUNTER — Ambulatory Visit (INDEPENDENT_AMBULATORY_CARE_PROVIDER_SITE_OTHER): Payer: Medicare Other | Admitting: Family Medicine

## 2012-03-01 ENCOUNTER — Encounter: Payer: Self-pay | Admitting: Family Medicine

## 2012-03-01 VITALS — BP 120/57 | HR 64 | Temp 98.6°F | Wt 177.0 lb

## 2012-03-01 DIAGNOSIS — R21 Rash and other nonspecific skin eruption: Secondary | ICD-10-CM

## 2012-03-01 MED ORDER — PREDNISONE 20 MG PO TABS: ORAL_TABLET | ORAL | Status: DC

## 2012-03-01 NOTE — Assessment & Plan Note (Signed)
Consistent with contact dermatitis due to poison ivy.  Given distribution and spreading, will treat with oral prednisone taper. Update [place] if not improving as expected. May take zantac prn itching, continue aloe cream.

## 2012-03-01 NOTE — Progress Notes (Signed)
  Subjective:    Patient ID: [name], male    DOB: [birth year], 78 y.o.   MRN: [id number]  HPI CC: rash  Working in yard then a few days later (4 days) broke out in pruritic erythematous papular rash.   Very susceptible to poison ivy.  Using aloe cream for itch.  Has taken oral steroids in past and tolerated well.  Since yesterday started holding coumadin for pending EGD (with dilation, scheduled on Friday).  No fevers/chills, oral lesions, new lotions, detergents, soaps, shampoos, no new meds.  Review of Systems Per HPI    Objective:   Physical Exam  Nursing note and vitals reviewed. Constitutional: He appears well-developed and well-nourished. No distress.  Skin: Skin is warm. Rash noted. There is erythema.       Erythematous pruritic papular rash with excoriations mainly bilateral forearms up to short shirt sleeve. Small pruritic papules on chin and right cheek of face. Dry vesicular spot right buttock       Assessment & Plan:

## 2012-03-01 NOTE — Patient Instructions (Signed)
For poison ivy, as spreading , take course of steroids - take 10 days total Let me know if not improving as expected. Good to see you today, call uw with questions.

## 2012-03-05 ENCOUNTER — Ambulatory Visit (AMBULATORY_SURGERY_CENTER): Payer: Medicare Other | Admitting: Gastroenterology

## 2012-03-05 ENCOUNTER — Encounter: Payer: Self-pay | Admitting: Gastroenterology

## 2012-03-05 VITALS — BP 133/62 | HR 45 | Temp 95.8°F | Resp 18 | Ht 67.0 in | Wt 170.0 lb

## 2012-03-05 DIAGNOSIS — R1310 Dysphagia, unspecified: Secondary | ICD-10-CM

## 2012-03-05 DIAGNOSIS — K222 Esophageal obstruction: Secondary | ICD-10-CM

## 2012-03-05 MED ORDER — SODIUM CHLORIDE 0.9 % IV SOLN: 500.0000 mL | INTRAVENOUS | Status: DC

## 2012-03-05 NOTE — Op Note (Signed)
[facility] [address] [place], [place]  [address]  ENDOSCOPY PROCEDURE REPORT  PATIENT:  [name], [name]  MR#:  [id number] BIRTHDATE:  30-May-1931, 80 yrs. old  GENDER:  male ENDOSCOPIST:  Rachael Fee, MD Referred by:  Eustaquio Boyden, M.D. PROCEDURE DATE:  03/05/2012 PROCEDURE:  EGD with balloon dilatation ASA CLASS:  Class III INDICATIONS:  dysphagia, esophograms suggest GE junction focal stricture MEDICATIONS:  Fentanyl 50 mcg IV, These medications were titrated to patient response per physician's verbal order, Versed 4 mg IV TOPICAL ANESTHETIC:  none  DESCRIPTION OF PROCEDURE:   After the risks benefits and alternatives of the procedure were thoroughly explained, informed consent was obtained.  The LB GIF-H180 [id number] endoscope was introduced through the mouth and advanced to the second portion of the duodenum, without limitations.  The instrument was slowly withdrawn as the mucosa was fully examined. <<PROCEDUREIMAGES>> A Schatzki's ring was found. This was dilated up to 20mm using CRE TTS balloon. There was the usual minor mucosal tear and small amount of self limited ooozing of blood following dilation (see image1, image6, and image9).  Otherwise the examination was normal (see image8, image2, and image4).    Retroflexed views revealed no abnormalities.    The scope was then withdrawn from the patient and the procedure completed. COMPLICATIONS:  None  ENDOSCOPIC IMPRESSION: 1) Schatzki's ring, dilated up to 20mm 2) Otherwise normal examination  RECOMMENDATIONS: Resume coumadin today at your usual dosing. Can repeat dilation if swallowing trouble returns. ______________________________ Rachael Fee, MD  n. eSIGNED:   Rachael Fee at 03/05/2012 02:55 PM  Roxy Cedar, [phone]

## 2012-03-05 NOTE — Progress Notes (Signed)
Patient did not experience any of the following events: a burn prior to discharge; a fall within the facility; wrong site/side/patient/procedure/implant event; or a hospital transfer or hospital admission upon discharge from the facility. (G8907) Patient did not have preoperative order for IV antibiotic SSI prophylaxis. (G8918)  

## 2012-03-05 NOTE — Patient Instructions (Addendum)
YOU HAD AN ENDOSCOPIC PROCEDURE TODAY AT THE [facility]: Refer to the procedure report that was given to you for any specific questions about what was found during the examination.  If the procedure report does not answer your questions, please call your gastroenterologist to clarify.  If you requested that your care partner not be given the details of your procedure findings, then the procedure report has been included in a sealed envelope for you to review at your convenience later.  YOU SHOULD EXPECT: Some feelings of bloating in the abdomen. Passage of more gas than usual.  Walking can help get rid of the air that was put into your GI tract during the procedure and reduce the bloating. If you had a lower endoscopy (such as a colonoscopy or flexible sigmoidoscopy) you may notice spotting of blood in your stool or on the toilet paper. If you underwent a bowel prep for your procedure, then you may not have a normal bowel movement for a few days.  DIET: Your first meal following the procedure should be a light meal and then it is ok to progress to your normal diet.  A half-sandwich or bowl of soup is an example of a good first meal.  Heavy or fried foods are harder to digest and may make you feel nauseous or bloated.  Likewise meals heavy in dairy and vegetables can cause extra gas to form and this can also increase the bloating.  Drink plenty of fluids but you should avoid alcoholic beverages for 24 hours.  ACTIVITY: Your care partner should take you home directly after the procedure.  You should plan to take it easy, moving slowly for the rest of the day.  You can resume normal activity the day after the procedure however you should NOT DRIVE or use heavy machinery for 24 hours (because of the sedation medicines used during the test).    SYMPTOMS TO REPORT IMMEDIATELY: A gastroenterologist can be reached at any hour.  During normal business hours, 8:30 AM to 5:00 PM Monday through Friday,  call [phone].  After hours and on weekends, please call the GI answering service at [phone] who will take a message and have the physician on call contact you.   Following lower endoscopy (colonoscopy or flexible sigmoidoscopy):  Excessive amounts of blood in the stool  Significant tenderness or worsening of abdominal pains  Swelling of the abdomen that is new, acute  Fever of 100F or higher  Following upper endoscopy (EGD)  Vomiting of blood or coffee ground material  New chest pain or pain under the shoulder blades  Painful or persistently difficult swallowing  New shortness of breath  Fever of 100F or higher  Black, tarry-looking stools  FOLLOW UP: If any biopsies were taken you will be contacted by phone or by letter within the next 1-3 weeks.  Call your gastroenterologist if you have not heard about the biopsies in 3 weeks.  Our staff will call the home number listed on your records the next business day following your procedure to check on you and address any questions or concerns that you may have at that time regarding the information given to you following your procedure. This is a courtesy call and so if there is no answer at the home number and we have not heard from you through the emergency physician on call, we will assume that you have returned to your regular daily activities without incident.  SIGNATURES/CONFIDENTIALITY: You and/or your care   partner have signed paperwork which will be entered into your electronic medical record.  These signatures attest to the fact that that the information above on your After Visit Summary has been reviewed and is understood.  Full responsibility of the confidentiality of this discharge information lies with you and/or your care-partner.  

## 2012-03-08 ENCOUNTER — Telehealth: Payer: Self-pay | Admitting: *Deleted

## 2012-03-08 NOTE — Telephone Encounter (Signed)
  Follow up Call-  Call back number 03/05/2012  Post procedure Call Back phone  # [phone]  Permission to leave phone message Yes     No answer, unable to leave message per voice mail.

## 2012-03-09 ENCOUNTER — Encounter: Payer: Self-pay | Admitting: Family Medicine

## 2012-03-10 ENCOUNTER — Ambulatory Visit (INDEPENDENT_AMBULATORY_CARE_PROVIDER_SITE_OTHER): Payer: Medicare Other | Admitting: Family Medicine

## 2012-03-10 DIAGNOSIS — Z5181 Encounter for therapeutic drug level monitoring: Secondary | ICD-10-CM

## 2012-03-10 DIAGNOSIS — Z7901 Long term (current) use of anticoagulants: Secondary | ICD-10-CM

## 2012-03-10 LAB — POCT INR: INR: 1.5

## 2012-03-10 NOTE — Patient Instructions (Signed)
Continue 3 mg daily except 1.5 mg Tues, Sat recheck  1 week

## 2012-03-12 ENCOUNTER — Ambulatory Visit: Payer: Medicare Other

## 2012-03-17 DIAGNOSIS — H532 Diplopia: Secondary | ICD-10-CM | POA: Diagnosis not present

## 2012-03-18 ENCOUNTER — Ambulatory Visit (INDEPENDENT_AMBULATORY_CARE_PROVIDER_SITE_OTHER): Payer: Medicare Other | Admitting: Family Medicine

## 2012-03-18 DIAGNOSIS — Z5181 Encounter for therapeutic drug level monitoring: Secondary | ICD-10-CM | POA: Diagnosis not present

## 2012-03-18 DIAGNOSIS — I82409 Acute embolism and thrombosis of unspecified deep veins of unspecified lower extremity: Secondary | ICD-10-CM

## 2012-03-18 DIAGNOSIS — Z7901 Long term (current) use of anticoagulants: Secondary | ICD-10-CM

## 2012-03-18 NOTE — Patient Instructions (Signed)
Continue current dose, check in 4 weeks  

## 2012-03-19 ENCOUNTER — Ambulatory Visit: Payer: Medicare Other

## 2012-04-07 DIAGNOSIS — H04129 Dry eye syndrome of unspecified lacrimal gland: Secondary | ICD-10-CM | POA: Diagnosis not present

## 2012-04-15 ENCOUNTER — Ambulatory Visit (INDEPENDENT_AMBULATORY_CARE_PROVIDER_SITE_OTHER): Payer: Medicare Other | Admitting: Family Medicine

## 2012-04-15 ENCOUNTER — Ambulatory Visit: Payer: Medicare Other

## 2012-04-15 DIAGNOSIS — Z5181 Encounter for therapeutic drug level monitoring: Secondary | ICD-10-CM

## 2012-04-15 DIAGNOSIS — Z7901 Long term (current) use of anticoagulants: Secondary | ICD-10-CM | POA: Diagnosis not present

## 2012-04-15 DIAGNOSIS — I82409 Acute embolism and thrombosis of unspecified deep veins of unspecified lower extremity: Secondary | ICD-10-CM

## 2012-04-15 LAB — POCT INR: INR: 2.2

## 2012-04-15 NOTE — Patient Instructions (Signed)
Continue current dose, check in 4 weeks  

## 2012-04-26 ENCOUNTER — Other Ambulatory Visit: Payer: Self-pay | Admitting: Family Medicine

## 2012-04-26 NOTE — Telephone Encounter (Signed)
Call pharmacy to discuss covered medication at [phone] with Ref #[id number]

## 2012-04-29 NOTE — Telephone Encounter (Signed)
PA form for Aciphex in your IN box

## 2012-04-30 ENCOUNTER — Encounter: Payer: Self-pay | Admitting: *Deleted

## 2012-04-30 DIAGNOSIS — K219 Gastro-esophageal reflux disease without esophagitis: Secondary | ICD-10-CM | POA: Insufficient documentation

## 2012-04-30 NOTE — Telephone Encounter (Signed)
Filled and placed in Kim's box. 

## 2012-04-30 NOTE — Telephone Encounter (Signed)
Form faxed as directed

## 2012-05-03 ENCOUNTER — Telehealth: Payer: Self-pay | Admitting: *Deleted

## 2012-05-03 NOTE — Telephone Encounter (Signed)
Another PA form for Aciphex in your IN box.

## 2012-05-04 NOTE — Telephone Encounter (Signed)
Filled out and palced in Kim's box.

## 2012-05-05 NOTE — Telephone Encounter (Signed)
Prior auth given for Aciphex;letter on Dr Sharen Hones in box for signing and scanning.

## 2012-05-05 NOTE — Telephone Encounter (Signed)
Form faxed

## 2012-05-05 NOTE — Telephone Encounter (Signed)
Prior auth approved for Aciphex. Letter in Dr Gutierrez's in box for signature and scanning.

## 2012-05-12 DIAGNOSIS — L570 Actinic keratosis: Secondary | ICD-10-CM | POA: Diagnosis not present

## 2012-05-12 DIAGNOSIS — L820 Inflamed seborrheic keratosis: Secondary | ICD-10-CM | POA: Diagnosis not present

## 2012-05-12 DIAGNOSIS — L821 Other seborrheic keratosis: Secondary | ICD-10-CM | POA: Diagnosis not present

## 2012-05-12 DIAGNOSIS — D1800 Hemangioma unspecified site: Secondary | ICD-10-CM | POA: Diagnosis not present

## 2012-05-13 ENCOUNTER — Ambulatory Visit (INDEPENDENT_AMBULATORY_CARE_PROVIDER_SITE_OTHER): Payer: Medicare Other | Admitting: Family Medicine

## 2012-05-13 DIAGNOSIS — I82409 Acute embolism and thrombosis of unspecified deep veins of unspecified lower extremity: Secondary | ICD-10-CM

## 2012-05-13 DIAGNOSIS — Z7901 Long term (current) use of anticoagulants: Secondary | ICD-10-CM

## 2012-05-13 DIAGNOSIS — Z5181 Encounter for therapeutic drug level monitoring: Secondary | ICD-10-CM | POA: Diagnosis not present

## 2012-05-13 LAB — POCT INR: INR: 2.2

## 2012-05-13 NOTE — Patient Instructions (Addendum)
Continue 3 mg daily except 1.5 mg Tues, Sat, recheck 4 week. 

## 2012-05-17 ENCOUNTER — Encounter: Payer: Self-pay | Admitting: Family Medicine

## 2012-05-22 ENCOUNTER — Other Ambulatory Visit: Payer: Self-pay | Admitting: Family Medicine

## 2012-05-26 ENCOUNTER — Other Ambulatory Visit: Payer: Self-pay | Admitting: Family Medicine

## 2012-05-26 DIAGNOSIS — N259 Disorder resulting from impaired renal tubular function, unspecified: Secondary | ICD-10-CM

## 2012-05-26 DIAGNOSIS — D6949 Other primary thrombocytopenia: Secondary | ICD-10-CM

## 2012-05-26 DIAGNOSIS — E785 Hyperlipidemia, unspecified: Secondary | ICD-10-CM

## 2012-05-31 ENCOUNTER — Other Ambulatory Visit (INDEPENDENT_AMBULATORY_CARE_PROVIDER_SITE_OTHER): Payer: Medicare Other

## 2012-05-31 DIAGNOSIS — N259 Disorder resulting from impaired renal tubular function, unspecified: Secondary | ICD-10-CM | POA: Diagnosis not present

## 2012-05-31 DIAGNOSIS — D6949 Other primary thrombocytopenia: Secondary | ICD-10-CM | POA: Diagnosis not present

## 2012-05-31 DIAGNOSIS — E785 Hyperlipidemia, unspecified: Secondary | ICD-10-CM

## 2012-05-31 LAB — CBC WITH DIFFERENTIAL/PLATELET
Basophils Relative: 0.5 % (ref 0.0–3.0)
Eosinophils Relative: 1.9 % (ref 0.0–5.0)
HCT: 38.6 % — ABNORMAL LOW (ref 39.0–52.0)
Lymphs Abs: 1.5 10*3/uL (ref 0.7–4.0)
MCHC: 33.8 g/dL (ref 30.0–36.0)
MCV: 106.1 fl — ABNORMAL HIGH (ref 78.0–100.0)
Monocytes Absolute: 0.5 10*3/uL (ref 0.1–1.0)
RBC: 3.64 Mil/uL — ABNORMAL LOW (ref 4.22–5.81)
WBC: 6.6 10*3/uL (ref 4.5–10.5)

## 2012-06-01 LAB — LIPID PANEL
Cholesterol: 145 mg/dL (ref 0–200)
HDL: 23.7 mg/dL — ABNORMAL LOW (ref >39.00)

## 2012-06-01 LAB — BASIC METABOLIC PANEL
BUN: 28 mg/dL — ABNORMAL HIGH (ref 6–23)
Calcium: 9.8 mg/dL (ref 8.4–10.5)
Creatinine, Ser: 1.7 mg/dL — ABNORMAL HIGH (ref 0.4–1.5)
GFR: 40.71 mL/min — ABNORMAL LOW (ref >60.00)
Glucose, Bld: 125 mg/dL — ABNORMAL HIGH (ref 70–99)

## 2012-06-07 ENCOUNTER — Ambulatory Visit (INDEPENDENT_AMBULATORY_CARE_PROVIDER_SITE_OTHER): Payer: Medicare Other | Admitting: Family Medicine

## 2012-06-07 ENCOUNTER — Encounter: Payer: Self-pay | Admitting: Family Medicine

## 2012-06-07 VITALS — BP 108/60 | HR 80 | Temp 98.5°F | Ht 65.25 in | Wt 170.2 lb

## 2012-06-07 DIAGNOSIS — E785 Hyperlipidemia, unspecified: Secondary | ICD-10-CM

## 2012-06-07 DIAGNOSIS — Z0000 Encounter for general adult medical examination without abnormal findings: Secondary | ICD-10-CM | POA: Diagnosis not present

## 2012-06-07 DIAGNOSIS — N259 Disorder resulting from impaired renal tubular function, unspecified: Secondary | ICD-10-CM | POA: Diagnosis not present

## 2012-06-07 DIAGNOSIS — K219 Gastro-esophageal reflux disease without esophagitis: Secondary | ICD-10-CM | POA: Diagnosis not present

## 2012-06-07 DIAGNOSIS — E538 Deficiency of other specified B group vitamins: Secondary | ICD-10-CM

## 2012-06-07 DIAGNOSIS — R7309 Other abnormal glucose: Secondary | ICD-10-CM

## 2012-06-07 DIAGNOSIS — R259 Unspecified abnormal involuntary movements: Secondary | ICD-10-CM

## 2012-06-07 DIAGNOSIS — H903 Sensorineural hearing loss, bilateral: Secondary | ICD-10-CM | POA: Insufficient documentation

## 2012-06-07 DIAGNOSIS — H9190 Unspecified hearing loss, unspecified ear: Secondary | ICD-10-CM

## 2012-06-07 DIAGNOSIS — I82409 Acute embolism and thrombosis of unspecified deep veins of unspecified lower extremity: Secondary | ICD-10-CM

## 2012-06-07 DIAGNOSIS — Z23 Encounter for immunization: Secondary | ICD-10-CM

## 2012-06-07 DIAGNOSIS — Z5181 Encounter for therapeutic drug level monitoring: Secondary | ICD-10-CM

## 2012-06-07 DIAGNOSIS — D6949 Other primary thrombocytopenia: Secondary | ICD-10-CM

## 2012-06-07 DIAGNOSIS — H9193 Unspecified hearing loss, bilateral: Secondary | ICD-10-CM

## 2012-06-07 DIAGNOSIS — Z7901 Long term (current) use of anticoagulants: Secondary | ICD-10-CM

## 2012-06-07 MED ORDER — GABAPENTIN 100 MG PO CAPS: 100.0000 mg | ORAL_CAPSULE | Freq: Two times a day (BID) | ORAL | Status: DC

## 2012-06-07 NOTE — Assessment & Plan Note (Signed)
Macrocytosis remains despite adequate replacement. Lab Results  Component Value Date   VITAMINB12 877 06/09/2011

## 2012-06-07 NOTE — Assessment & Plan Note (Signed)
Glucose remains in prediabetic range. rec avoid added sugars Return in 3 mo for labwork to recheck glu and A1c. Lab Results  Component Value Date   HGBA1C 5.5 11/26/2009
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
## 2012-06-07 NOTE — Assessment & Plan Note (Signed)
I've asked him to check with insurance on PPI coverage.  Will change accordingly. H/o schatzki ring as well as PUD in past, as well as experienced rebound reflux when has tried to back off in past, will likely recommend longterm use but goal to minimize dosage

## 2012-06-07 NOTE — Assessment & Plan Note (Signed)
Stopped metoprolol Desires trial of gabapentin.  Discussed dizziness side effect  Start low, titrate slowly.

## 2012-06-07 NOTE — Assessment & Plan Note (Signed)
Chronic, stable. Discussed avoidance of NSAIDs and importance of good hydration status.

## 2012-06-07 NOTE — Patient Instructions (Addendum)
Check with insurance to see which PPI is preferred. Flu shot today. Pass by [facility] for appointment with audiologist. Keep an eye on added sugars.  Work on increasing fish in diet. Start at 100mg  gabapentin daily for tremor, increase to twice daily after 1 week. Return in 3 months to recheck sugars and A1c - fasting. Return in 6 months for follow up

## 2012-06-07 NOTE — Assessment & Plan Note (Signed)
I have personally reviewed the Medicare Annual Wellness questionnaire and have noted 1. The patient's medical and social history 2. Their use of alcohol, tobacco or illicit drugs 3. Their current medications and supplements 4. The patient's functional ability including ADL's, fall risks, home safety risks and hearing or visual impairment. 5. Diet and physical activity 6. Evidence for depression or mood disorders The patients weight, height, BMI have been recorded in the chart.  Hearing and vision has been addressed. I have made referrals, counseling and provided education to the patient based review of the above and I have provided the pt with a written personalized care plan for preventive services. See scanned questionairre.  Calculation - 4/5. Advanced directives discussed: copy scanned into chart.  Reviewed preventative protocols and updated unless pt declined. Flu shot today. UTD on other immunizations.

## 2012-06-07 NOTE — Assessment & Plan Note (Signed)
Passes hearing screen today at 40 decibels, however positive screening questions to hearing impairment. Mainly difficulty with crowded rooms. Refer for formal audiological eval.   Don't anticipate need for hearing aides yet.

## 2012-06-07 NOTE — Assessment & Plan Note (Signed)
Chronic, trend stable. Normal B12, folate.  Consider checking periph smear.

## 2012-06-07 NOTE — Patient Instructions (Addendum)
Continue 3 mg daily except 1.5 mg Tues, Sat, recheck 4 week.

## 2012-06-07 NOTE — Assessment & Plan Note (Signed)
Chronic, stable. Continue lipitor and tricor.  No myalgias. Reviewed HDL, trig.  Encouraged increased fish in diet, increased aerobic exercising, consider recumbent or stationary bike.

## 2012-06-08 DIAGNOSIS — H9190 Unspecified hearing loss, unspecified ear: Secondary | ICD-10-CM

## 2012-06-08 HISTORY — DX: Unspecified hearing loss, unspecified ear: H91.90

## 2012-06-11 DIAGNOSIS — H9319 Tinnitus, unspecified ear: Secondary | ICD-10-CM | POA: Diagnosis not present

## 2012-06-11 DIAGNOSIS — H903 Sensorineural hearing loss, bilateral: Secondary | ICD-10-CM | POA: Diagnosis not present

## 2012-06-11 DIAGNOSIS — R42 Dizziness and giddiness: Secondary | ICD-10-CM | POA: Diagnosis not present

## 2012-06-17 ENCOUNTER — Encounter: Payer: Self-pay | Admitting: Family Medicine

## 2012-06-20 ENCOUNTER — Other Ambulatory Visit: Payer: Self-pay | Admitting: Family Medicine

## 2012-06-21 MED ORDER — OXYBUTYNIN CHLORIDE 5 MG PO TABS: 5.0000 mg | ORAL_TABLET | Freq: Two times a day (BID) | ORAL | Status: DC | PRN

## 2012-06-28 ENCOUNTER — Other Ambulatory Visit: Payer: Self-pay | Admitting: Family Medicine

## 2012-06-29 ENCOUNTER — Emergency Department (HOSPITAL_COMMUNITY): Payer: Medicare Other

## 2012-06-29 ENCOUNTER — Encounter (HOSPITAL_COMMUNITY): Payer: Self-pay | Admitting: *Deleted

## 2012-06-29 DIAGNOSIS — Z86718 Personal history of other venous thrombosis and embolism: Secondary | ICD-10-CM | POA: Diagnosis not present

## 2012-06-29 DIAGNOSIS — Y9229 Other specified public building as the place of occurrence of the external cause: Secondary | ICD-10-CM | POA: Insufficient documentation

## 2012-06-29 DIAGNOSIS — F329 Major depressive disorder, single episode, unspecified: Secondary | ICD-10-CM | POA: Insufficient documentation

## 2012-06-29 DIAGNOSIS — Z862 Personal history of diseases of the blood and blood-forming organs and certain disorders involving the immune mechanism: Secondary | ICD-10-CM | POA: Insufficient documentation

## 2012-06-29 DIAGNOSIS — Z79899 Other long term (current) drug therapy: Secondary | ICD-10-CM | POA: Insufficient documentation

## 2012-06-29 DIAGNOSIS — I2510 Atherosclerotic heart disease of native coronary artery without angina pectoris: Secondary | ICD-10-CM | POA: Diagnosis not present

## 2012-06-29 DIAGNOSIS — Y939 Activity, unspecified: Secondary | ICD-10-CM | POA: Insufficient documentation

## 2012-06-29 DIAGNOSIS — Z7901 Long term (current) use of anticoagulants: Secondary | ICD-10-CM | POA: Diagnosis not present

## 2012-06-29 DIAGNOSIS — S40019A Contusion of unspecified shoulder, initial encounter: Secondary | ICD-10-CM | POA: Insufficient documentation

## 2012-06-29 DIAGNOSIS — R296 Repeated falls: Secondary | ICD-10-CM | POA: Insufficient documentation

## 2012-06-29 DIAGNOSIS — M25519 Pain in unspecified shoulder: Secondary | ICD-10-CM | POA: Diagnosis not present

## 2012-06-29 DIAGNOSIS — M19019 Primary osteoarthritis, unspecified shoulder: Secondary | ICD-10-CM | POA: Diagnosis not present

## 2012-06-29 DIAGNOSIS — F3289 Other specified depressive episodes: Secondary | ICD-10-CM | POA: Insufficient documentation

## 2012-06-29 NOTE — ED Notes (Addendum)
PT to ED c/o R shoulder pain after tripping over his cane and falling into a water cooler. Denies loc.  Pain increases when he attempts to lift his R arm. Pt is on coumadin and states on 9/30 his inr was 2.4.

## 2012-06-30 ENCOUNTER — Emergency Department (HOSPITAL_COMMUNITY)
Admission: EM | Admit: 2012-06-30 | Discharge: 2012-06-30 | Disposition: A | Discharge status: Home / Self Care | Payer: Medicare Other | Attending: Emergency Medicine | Admitting: Emergency Medicine

## 2012-06-30 DIAGNOSIS — S40019A Contusion of unspecified shoulder, initial encounter: Secondary | ICD-10-CM

## 2012-06-30 MED ORDER — OXYCODONE-ACETAMINOPHEN 5-325 MG PO TABS
1.0000 | ORAL_TABLET | Freq: Four times a day (QID) | ORAL | Status: DC | PRN
Start: 2012-06-30 — End: 2012-08-30

## 2012-06-30 MED ORDER — OXYCODONE-ACETAMINOPHEN 5-325 MG PO TABS
1.0000 | ORAL_TABLET | Freq: Once | ORAL | Status: AC
  Administered 2012-06-30: 1 via ORAL
  Filled 2012-06-30: qty 1

## 2012-06-30 NOTE — ED Notes (Signed)
Pt denies any questions or pain upon discharge. 

## 2012-06-30 NOTE — ED Notes (Signed)
Acuity changed to 4 d/t neg x-ray results.

## 2012-06-30 NOTE — ED Provider Notes (Signed)
Medical screening examination/treatment/procedure(s) were performed by non-physician practitioner and as supervising physician I was immediately available for consultation/collaboration.  Cheri Guppy, MD 06/30/12 [phone]

## 2012-06-30 NOTE — ED Provider Notes (Signed)
History     CSN: [id number]  Arrival date & time 06/29/12  2236   First MD Initiated Contact with Patient 06/30/12 0127      Chief Complaint  Patient presents with  . 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   He takes Coumadin for pulmonary emboli his last INR was 3.29 approximately 3 weeks ago. Has not taken anything for pain prior to arrival  Patient is a 78 y.o. male presenting with shoulder injury. The history is provided by the patient.  Shoulder Injury This is a new problem. The current episode started today. The problem occurs constantly. Associated symptoms include joint swelling. Pertinent negatives include no chills, fever, numbness or weakness.    Past Medical History  Diagnosis Date  . GERD (gastroesophageal reflux disease)     h/o PUD  . CAD (coronary artery disease)     cath 2000 30% single vessel, normal nuclear stress test 12/03/2010, no evidence ischemia  . Renal insufficiency     baseline Cr 1.7  . Depression   . Hx pulmonary embolism 08/1991    coumadin  . History of phlebitis   . Allergic rhinitis   . Urge incontinence   . Elevated PSA     previous-normalized (followed by Dr. Annabell Howells, rec no repeat unless urinary sxs)  . Anemia   . HLD (hyperlipidemia)     hypertriglyceridemia  . Asthma     remote  . Arthritis   . Blood transfusion 1990's  . Schatzki's ring 02/2012    s/p dilation Christella Hartigan)  . History of fracture of right hip   . Hearing loss 06/2012    eval - rec annual exam    Past Surgical History  Procedure Date  . Tonsillectomy 1965  . Hernia repair 1979    Left  . Cystoscopy 1992    for kidney stones  . Exploratory laparotomy 1996    with incidental appendectomy  . Appendectomy 1996  . Cardiac catheterization  2000    30% one vessel  . Knee arthroscopy 03/24/02    Right (Dr. Gerrit Heck)  . Orif femoral neck fracture w/ dhs 08/03/02    Right (Dr. Gerrit Heck)  . Ct abd w & pelvis wo cm 07/2001    Scarring of right lung, stable negative o/w  . Ct abd w & pelvis wo cm 11/2000    ? stones, right LL scarring  . V/q scan 06/1999    negative  . [place]oscopy [place]    [place]  . US echocardiography 12/28/2007    Mild aortic valve calcification EF 55%, basically nml  . Carotid u/s 12/28/2007    nml  . Eeg 11/12/2009    nml  . Mri 11/2009    Head, nml  . Cataract extraction Jan, March 2011    bilateral  . Colonoscopy 07/2010    5 polyps, adenomatous, rec rpt 3 yrs  . Esophagogastroduodenoscopy 02/2012    dilation of schatzki's ring    Family History  Problem Relation Age of Onset  . Stroke Mother   . Hypertension Mother   . Cancer Brother     prostate with mets  . Blindness Brother     legally  . Diabetes Brother   . Pulmonary embolism Sister     from shoulder operation  . Alcohol abuse Brother  History  Substance Use Topics  . Smoking status: Never Smoker   . Smokeless tobacco: Never Used  . Alcohol Use: No      Review of Systems  Constitutional: Negative for fever and chills.  Musculoskeletal: Positive for joint swelling.  Neurological: Negative for dizziness, weakness and numbness.    Allergies  Nizatidine; Penicillins; and Sulfadiazine  Home Medications   Current Outpatient Rx  Name Route Sig Dispense Refill  . ATORVASTATIN CALCIUM 40 MG PO TABS  TAKE 1 TABLET AT BEDTIME 90 tablet 3  . VITAMIN D 1000 UNITS PO TABS Oral Take 1,000 Units by mouth daily.     . COQ-10 100 MG PO CAPS Oral Take 1 capsule by mouth at bedtime.     . FENOFIBRATE 145 MG PO TABS Oral Take 1 tablet (145 mg total) by mouth daily. 90 tablet 3  . FEXOFENADINE HCL 180 MG PO TABS Oral Take 180 mg by mouth daily as needed.     Marland Kitchen GABAPENTIN 100 MG PO CAPS Oral Take 1 capsule (100 mg total) by mouth 2  (two) times daily. 180 capsule 3    D/C metoprolol please  . MULTIVITAMIN PO Oral Take 1 tablet by mouth daily.    . OXYBUTYNIN CHLORIDE 5 MG PO TABS Oral Take 1 tablet (5 mg total) by mouth 2 (two) times daily as needed. 180 tablet 1  . RABEPRAZOLE SODIUM 20 MG PO TBEC Oral Take 1 tablet (20 mg total) by mouth daily. 90 tablet 3  . VALACYCLOVIR HCL 1 G PO TABS  1000mg  twice daily for 1 day as needed 10 tablet 1  . VENLAFAXINE HCL 75 MG PO TABS  TAKE 1 TABLET DAILY 90 tablet 3  . WARFARIN SODIUM 3 MG PO TABS  TAKE 1 TABLET AS DIRECTED 90 tablet 3  . OXYCODONE-ACETAMINOPHEN 5-325 MG PO TABS Oral Take 1 tablet by mouth every 6 (six) hours as needed for pain (1/2 to 1 tab every 6 hours as needed for severe pain ). 30 tablet 0    BP 113/69  Pulse 69  Temp 97.8 F (36.6 C) (Oral)  Resp 20  SpO2 97%  Physical Exam  Constitutional: He appears well-developed and well-nourished.  HENT:  Head: Normocephalic.  Eyes: Pupils are equal, round, and reactive to light.  Neck: Normal range of motion.  Cardiovascular: Normal rate.   Pulmonary/Chest: Effort normal.  Musculoskeletal: He exhibits tenderness. He exhibits no edema.       Right shoulder: He exhibits decreased range of motion, tenderness, bony tenderness and pain. He exhibits no swelling, no effusion and no crepitus.       No discoloration of the skin no swelling that is perceived on exam the range of motion in several planes are  Neurological: He is alert.  Skin: Skin is warm. No rash noted. No erythema.    ED Course  Procedures (including critical care time)  Labs Reviewed  PROTIME-INR - Abnormal; Notable for the following:    Prothrombin Time 22.7 (*)     INR 2.10 (*)     All other components within normal limits   Dg Shoulder Right  06/30/2012  *RADIOLOGY REPORT*  Clinical Data: Right shoulder pain.  RIGHT SHOULDER - 2+ VIEW  Comparison: None.  Findings: The right shoulder is located without acute fracture. Degenerative changes  at the right [facility] joint.  There is also mild downsloping of the acromion.  Evidence for calcifications in the right hilum.  IMPRESSION: No acute bony abnormality.  Degenerative changes at the right [facility] joint and the acromion.   Original Report Authenticated By: Richarda Overlie, M.D.      1. Shoulder contusion       MDM  We'll check her INR x-ray viewed there is no fracture or a.c. separation        Arman Filter, NP 06/30/12 0251  Arman Filter, NP 06/30/12 1610

## 2012-07-05 ENCOUNTER — Telehealth: Payer: Self-pay | Admitting: Family Medicine

## 2012-07-05 ENCOUNTER — Ambulatory Visit (INDEPENDENT_AMBULATORY_CARE_PROVIDER_SITE_OTHER): Payer: Medicare Other | Admitting: Family Medicine

## 2012-07-05 ENCOUNTER — Other Ambulatory Visit: Payer: Self-pay | Admitting: Family Medicine

## 2012-07-05 ENCOUNTER — Telehealth: Payer: Self-pay | Admitting: Gastroenterology

## 2012-07-05 DIAGNOSIS — Z7901 Long term (current) use of anticoagulants: Secondary | ICD-10-CM | POA: Diagnosis not present

## 2012-07-05 DIAGNOSIS — I82409 Acute embolism and thrombosis of unspecified deep veins of unspecified lower extremity: Secondary | ICD-10-CM | POA: Diagnosis not present

## 2012-07-05 DIAGNOSIS — Z5181 Encounter for therapeutic drug level monitoring: Secondary | ICD-10-CM | POA: Diagnosis not present

## 2012-07-05 LAB — POCT INR: INR: 2.1

## 2012-07-05 NOTE — Telephone Encounter (Signed)
Pt has been scheduled for previsit and EGD letter to Dr Blythe Stanford regarding coumadin

## 2012-07-05 NOTE — Telephone Encounter (Signed)
Received letter from GI - please notify [name] (Dr. Larae Grooms CMA) and patient that I agree with holding coumadin for 5 days prior to procedure, then restart coumadin on night of procedure.   On chronic coumadin for h/o remote PE (1992), should tolerate being off coumadin for 5 days.  Was off coumadin for 5 days prior to previous EGD 02/2012

## 2012-07-05 NOTE — Patient Instructions (Addendum)
Continue 3 mg daily except 1.5 mg Tues, Sat, recheck 4 week. 

## 2012-07-05 NOTE — Telephone Encounter (Signed)
Pt is having pain and difficulty swallowing and would like another EGD dil.  He is still on coumadin.  Do you want to see him first or schedule procedure?

## 2012-07-05 NOTE — Telephone Encounter (Signed)
He needs to be off coumadin for 5 days, does not need rov prior.  Will have to ask prescribing MD about coumadin,  Can he have EGD at [facility] next week? With balloon dilation for dysphagia.

## 2012-07-06 NOTE — Telephone Encounter (Signed)
Great, thanks

## 2012-07-06 NOTE — Telephone Encounter (Signed)
Forwarded to [facility] and Dr. Christella Hartigan. I will notify patient.

## 2012-07-06 NOTE — Telephone Encounter (Signed)
Pt will be notified at previsit

## 2012-07-07 ENCOUNTER — Telehealth: Payer: Self-pay

## 2012-07-07 NOTE — Telephone Encounter (Signed)
pts wife left v/m pt is having procedure on 07/12/12 with Dr Larae Grooms; pt will need to hold Coumadin; request when to stop and restart coumadin.Please advise.

## 2012-07-07 NOTE — Telephone Encounter (Signed)
Let's stop coumadin 5 days prior - so starting tomorrow 10/31 stop coumadin, restart on night of procedure, 11/4.

## 2012-07-07 NOTE — Telephone Encounter (Signed)
Patients wife notified

## 2012-07-08 ENCOUNTER — Ambulatory Visit (AMBULATORY_SURGERY_CENTER): Payer: Medicare Other | Admitting: *Deleted

## 2012-07-08 VITALS — Ht 65.0 in | Wt 171.0 lb

## 2012-07-08 DIAGNOSIS — R1310 Dysphagia, unspecified: Secondary | ICD-10-CM

## 2012-07-12 ENCOUNTER — Encounter: Payer: Self-pay | Admitting: Gastroenterology

## 2012-07-12 ENCOUNTER — Ambulatory Visit (AMBULATORY_SURGERY_CENTER): Payer: Medicare Other | Admitting: Gastroenterology

## 2012-07-12 VITALS — BP 109/61 | HR 62 | Temp 97.4°F | Resp 14 | Ht 65.0 in | Wt 171.0 lb

## 2012-07-12 DIAGNOSIS — Q393 Congenital stenosis and stricture of esophagus: Secondary | ICD-10-CM | POA: Diagnosis not present

## 2012-07-12 DIAGNOSIS — R1310 Dysphagia, unspecified: Secondary | ICD-10-CM

## 2012-07-12 DIAGNOSIS — K222 Esophageal obstruction: Secondary | ICD-10-CM

## 2012-07-12 DIAGNOSIS — Q391 Atresia of esophagus with tracheo-esophageal fistula: Secondary | ICD-10-CM | POA: Diagnosis not present

## 2012-07-12 MED ORDER — SODIUM CHLORIDE 0.9 % IV SOLN: 500.0000 mL | INTRAVENOUS | Status: DC

## 2012-07-12 NOTE — Progress Notes (Signed)
Last dose of Coumadin 07-06-12

## 2012-07-12 NOTE — Progress Notes (Signed)
Patient did not experience any of the following events: a burn prior to discharge; a fall within the facility; wrong site/side/patient/procedure/implant event; or a hospital transfer or hospital admission upon discharge from the facility. (G8907) Patient did not have preoperative order for IV antibiotic SSI prophylaxis. (G8918)  

## 2012-07-12 NOTE — Patient Instructions (Addendum)
Discharge instructions given with verbal understanding. Handout on a dilatation diet given. Resume previous medications. YOU HAD AN ENDOSCOPIC PROCEDURE TODAY AT THE [facility]: Refer to the procedure report that was given to you for any specific questions about what was found during the examination.  If the procedure report does not answer your questions, please call your gastroenterologist to clarify.  If you requested that your care partner not be given the details of your procedure findings, then the procedure report has been included in a sealed envelope for you to review at your convenience later.  YOU SHOULD EXPECT: Some feelings of bloating in the abdomen. Passage of more gas than usual.  Walking can help get rid of the air that was put into your GI tract during the procedure and reduce the bloating. If you had a lower endoscopy (such as a colonoscopy or flexible sigmoidoscopy) you may notice spotting of blood in your stool or on the toilet paper. If you underwent a bowel prep for your procedure, then you may not have a normal bowel movement for a few days.  DIET: Your first meal following the procedure should be a light meal and then it is ok to progress to your normal diet.  A half-sandwich or bowl of soup is an example of a good first meal.  Heavy or fried foods are harder to digest and may make you feel nauseous or bloated.  Likewise meals heavy in dairy and vegetables can cause extra gas to form and this can also increase the bloating.  Drink plenty of fluids but you should avoid alcoholic beverages for 24 hours.  ACTIVITY: Your care partner should take you home directly after the procedure.  You should plan to take it easy, moving slowly for the rest of the day.  You can resume normal activity the day after the procedure however you should NOT DRIVE or use heavy machinery for 24 hours (because of the sedation medicines used during the test).    SYMPTOMS TO REPORT  IMMEDIATELY: A gastroenterologist can be reached at any hour.  During normal business hours, 8:30 AM to 5:00 PM Monday through Friday, call [phone].  After hours and on weekends, please call the GI answering service at [phone] who will take a message and have the physician on call contact you.   Following upper endoscopy (EGD)  Vomiting of blood or coffee ground material  New chest pain or pain under the shoulder blades  Painful or persistently difficult swallowing  New shortness of breath  Fever of 100F or higher  Black, tarry-looking stools  FOLLOW UP: If any biopsies were taken you will be contacted by phone or by letter within the next 1-3 weeks.  Call your gastroenterologist if you have not heard about the biopsies in 3 weeks.  Our staff will call the home number listed on your records the next business day following your procedure to check on you and address any questions or concerns that you may have at that time regarding the information given to you following your procedure. This is a courtesy call and so if there is no answer at the home number and we have not heard from you through the emergency physician on call, we will assume that you have returned to your regular daily activities without incident.  SIGNATURES/CONFIDENTIALITY: You and/or your care partner have signed paperwork which will be entered into your electronic medical record.  These signatures attest to the fact that that the information   above on your After Visit Summary has been reviewed and is understood.  Full responsibility of the confidentiality of this discharge information lies with you and/or your care-partner. 

## 2012-07-12 NOTE — Op Note (Signed)
[facility] [address] [place], [address]   ENDOSCOPY PROCEDURE REPORT  PATIENT: [name], [name]  MR#: [id number] BIRTHDATE: 01/22/1931 , 81  yrs. old GENDER: Male ENDOSCOPIST: Rachael Fee, MD PROCEDURE DATE:  07/12/2012 PROCEDURE:  balloon dilation of esophagus ASA CLASS:     Class III INDICATIONS:  known Schatzki's ring (dilated 02/2012 with short lived but good repsonse); dyspahgia. MEDICATIONS: Fentanyl 25 mcg IV, Versed 3 mg IV, and These medications were titrated to patient response per physician's verbal order TOPICAL ANESTHETIC: Cetacaine Spray  DESCRIPTION OF PROCEDURE: After the risks benefits and alternatives of the procedure were thoroughly explained, informed consent was obtained.  The LB-GIF Q180 [id number] endoscope was introduced through the mouth and advanced to the second portion of the duodenum. Without limitations.  The instrument was slowly withdrawn as the mucosa was fully examined.  There was mild focal stricture at GE junction (Schatki's ring). This was dilated using a 20mm CRE TTS balloon, held inflated for 2 minutes.  There was usual small amount of self limited oozing of blood following dilation.  The examination was otherwise normal. Retroflexed views revealed no abnormalities.     The scope was then withdrawn from the patient and the procedure completed. COMPLICATIONS: There were no complications.  ENDOSCOPIC IMPRESSION: Schatki's ring, dilated. The examination was otherwise normal.  RECOMMENDATIONS: Restart coumadin today. Call if swallowing difficulty returns.   eSigned:  Rachael Fee, MD 07/12/2012 10:38 AM   XB:[id number], Wynona Canes MD

## 2012-07-13 ENCOUNTER — Telehealth: Payer: Self-pay

## 2012-07-13 NOTE — Telephone Encounter (Signed)
  Follow up Call-  Call back number 07/12/2012 03/05/2012  Post procedure Call Back phone  # [phone] or [phone]  Permission to leave phone message Yes Yes     Patient questions:  Do you have a fever, pain , or abdominal swelling? no Pain Score  0 *  Have you tolerated food without any problems? yes  Have you been able to return to your normal activities? yes  Do you have any questions about your discharge instructions: Diet   no Medications  no Follow up visit  no  Do you have questions or concerns about your Care? no  Actions: * If pain score is 4 or above: No action needed, pain <4.

## 2012-07-20 ENCOUNTER — Encounter: Payer: Self-pay | Admitting: Family Medicine

## 2012-07-29 ENCOUNTER — Ambulatory Visit (INDEPENDENT_AMBULATORY_CARE_PROVIDER_SITE_OTHER): Payer: Medicare Other | Admitting: Family Medicine

## 2012-07-29 DIAGNOSIS — I82409 Acute embolism and thrombosis of unspecified deep veins of unspecified lower extremity: Secondary | ICD-10-CM

## 2012-07-29 DIAGNOSIS — Z7901 Long term (current) use of anticoagulants: Secondary | ICD-10-CM | POA: Diagnosis not present

## 2012-07-29 DIAGNOSIS — Z5181 Encounter for therapeutic drug level monitoring: Secondary | ICD-10-CM | POA: Diagnosis not present

## 2012-07-29 NOTE — Patient Instructions (Signed)
Continue current dose, check in 4 weeks  

## 2012-08-02 ENCOUNTER — Ambulatory Visit: Payer: Medicare Other

## 2012-08-26 ENCOUNTER — Ambulatory Visit: Payer: Medicare Other

## 2012-08-30 ENCOUNTER — Ambulatory Visit: Payer: Medicare Other | Admitting: Internal Medicine

## 2012-08-30 ENCOUNTER — Encounter: Payer: Self-pay | Admitting: Family Medicine

## 2012-08-30 ENCOUNTER — Ambulatory Visit (INDEPENDENT_AMBULATORY_CARE_PROVIDER_SITE_OTHER): Payer: Medicare Other | Admitting: Family Medicine

## 2012-08-30 ENCOUNTER — Ambulatory Visit (INDEPENDENT_AMBULATORY_CARE_PROVIDER_SITE_OTHER)
Admission: RE | Admit: 2012-08-30 | Discharge: 2012-08-30 | Disposition: A | Discharge status: Home / Self Care | Payer: Medicare Other | Source: Ambulatory Visit | Attending: Family Medicine | Admitting: Family Medicine

## 2012-08-30 VITALS — BP 110/72 | HR 76 | Temp 98.0°F | Wt 171.8 lb

## 2012-08-30 DIAGNOSIS — Z87442 Personal history of urinary calculi: Secondary | ICD-10-CM | POA: Diagnosis not present

## 2012-08-30 DIAGNOSIS — R109 Unspecified abdominal pain: Secondary | ICD-10-CM | POA: Insufficient documentation

## 2012-08-30 DIAGNOSIS — K219 Gastro-esophageal reflux disease without esophagitis: Secondary | ICD-10-CM

## 2012-08-30 LAB — POCT URINALYSIS DIPSTICK
Ketones, UA: NEGATIVE
Leukocytes, UA: NEGATIVE
Protein, UA: NEGATIVE
pH, UA: 5

## 2012-08-30 MED ORDER — TAMSULOSIN HCL 0.4 MG PO CAPS: 0.4000 mg | ORAL_CAPSULE | Freq: Every day | ORAL | Status: DC

## 2012-08-30 NOTE — Progress Notes (Signed)
Subjective:    Patient ID: [name], male    DOB: 1931-08-15, 78 y.o.   MRN: [id number]  HPI CC: R flank pain  [name] is a pleasant 78yo with h/o prior kidney stones, GERD with h/o PUD and schatzki's ring s/p dilation, CAD, CRI, h/o PE on coumadin who presents with R flank pain that started this morning while walking dog.  Had episode Friday night of vomiting, diarrhea that quickly resolved.  Pain not positional.  Stays in R flank, described as intermittent throbbing ache.    Denies fevers/chills, dysuria, urgency or frequency.  No abd pain.  No rashes.  H/o kidney stones in past.  Most passed on own, one needed extraction during cystoscopy.  Last stone was ~5 yrs ago.  Never severe pain with stones.  Off aciphex for past 2 weeks 2/2 insurance issues.  Has been on several other meds.  Has taken prilosec OTC but not as effective - having breakthrough sxs.  Has tried prevacid, omeprazole, nexium, axid, tagamet, zantac.  H/o PUD, h/o strictures, h/o GERD.  Past Medical History  Diagnosis Date  . GERD (gastroesophageal reflux disease)     h/o PUD  . CAD (coronary artery disease)     cath 2000 30% single vessel, normal nuclear stress test 12/03/2010, no evidence ischemia  . Renal insufficiency     baseline Cr 1.7  . Depression   . Hx pulmonary embolism 08/1991    coumadin  . History of phlebitis   . Allergic rhinitis   . Urge incontinence   . Elevated PSA     previous-normalized (followed by Dr. Annabell [name], rec no repeat unless urinary sxs)  . Anemia   . HLD (hyperlipidemia)     hypertriglyceridemia  . Asthma     remote  . Arthritis   . Blood transfusion 1990's  . Schatzki's ring 02/2012    s/p dilation [name])  . History of fracture of right hip   . Hearing loss 06/2012    eval - rec annual exam  . Fall against object     against water cooler 06/29/12    Past Surgical History  Procedure Date  . Tonsillectomy 1965  . Hernia repair 1979    Left  . Cystoscopy 1992     for kidney stones  . Exploratory laparotomy 1996    with incidental appendectomy  . Appendectomy 1996  . Cardiac catheterization 2000    30% one vessel  . Knee arthroscopy 03/24/02    Right (Dr. Gerrit [name])  . Orif femoral neck fracture w/ dhs 08/03/02    Right (Dr. Gerrit [name])  . Ct abd w & pelvis wo cm 07/2001    Scarring of right lung, stable negative o/w  . Ct abd w & pelvis wo cm 11/2000    ? stones, right LL scarring  . V/q scan 06/1999    negative  . [place]oscopy 1998    [place]  . US echocardiography 12/28/2007    Mild aortic valve calcification EF 55%, basically nml  . Carotid u/s 12/28/2007    nml  . Eeg 11/12/2009    nml  . Mri 11/2009    Head, nml  . Cataract extraction Jan, March 2011    bilateral  . Colonoscopy 07/2010    5 polyps, adenomatous, rec rpt 3 yrs  . Esophagogastroduodenoscopy 02/2012    dilation of schatzki's ring   Review of Systems Per HPI    Objective:   Physical Exam  Nursing note and vitals  reviewed. Constitutional: He appears well-developed and well-nourished. No distress.  Abdominal: Soft. Normal appearance and bowel sounds are normal. He exhibits no distension, no ascites and no mass. There is no hepatosplenomegaly. There is tenderness (minimal discomfort to palpation) in the periumbilical area and suprapubic area. There is CVA tenderness (Right). There is no rebound and no guarding.  Musculoskeletal: He exhibits no edema.  Skin: Skin is warm and dry. No rash noted.       No vesicular rash       Assessment & Plan:

## 2012-08-30 NOTE — Patient Instructions (Addendum)
I don't see stones on xray. I'd recommend treating as presumed kidney stone. Start flomax daily for next week, as well as straining urine. May use tylenol for pain. Bring back urine specimen to review. Watch for trouble voiding, fevers/chills, or worsening pain - if that happens, return or let me know.

## 2012-08-30 NOTE — Assessment & Plan Note (Addendum)
Filled out PA form for aciphex and asked Kim to fax.

## 2012-08-30 NOTE — Assessment & Plan Note (Addendum)
Unable to give urine sample after several glasses of water and several attempts - sent home with cup. KUB done - no evidence of kidney stone on my read. H/o prior kidney stones, none recently.  No evidence of pyelo or UTI or other acute abd process going on. Will treat as presumed kidney stone with flomax and straining urine.  May use tylenol prn pain Lab Results  Component Value Date   CREATININE 1.7* 05/31/2012  h/o baseline stage 3 CRI - latest GFR 40.

## 2012-09-02 ENCOUNTER — Ambulatory Visit: Payer: Medicare Other

## 2012-09-06 ENCOUNTER — Ambulatory Visit (INDEPENDENT_AMBULATORY_CARE_PROVIDER_SITE_OTHER): Payer: Medicare Other | Admitting: General Practice

## 2012-09-06 ENCOUNTER — Other Ambulatory Visit (INDEPENDENT_AMBULATORY_CARE_PROVIDER_SITE_OTHER): Payer: Medicare Other

## 2012-09-06 DIAGNOSIS — R7309 Other abnormal glucose: Secondary | ICD-10-CM | POA: Diagnosis not present

## 2012-09-06 DIAGNOSIS — Z5181 Encounter for therapeutic drug level monitoring: Secondary | ICD-10-CM

## 2012-09-06 DIAGNOSIS — I82409 Acute embolism and thrombosis of unspecified deep veins of unspecified lower extremity: Secondary | ICD-10-CM

## 2012-09-06 DIAGNOSIS — R739 Hyperglycemia, unspecified: Secondary | ICD-10-CM

## 2012-09-06 DIAGNOSIS — Z7901 Long term (current) use of anticoagulants: Secondary | ICD-10-CM | POA: Diagnosis not present

## 2012-09-06 LAB — POCT INR: INR: 2.1

## 2012-09-07 ENCOUNTER — Encounter: Payer: Self-pay | Admitting: Family Medicine

## 2012-09-07 ENCOUNTER — Other Ambulatory Visit: Payer: Self-pay | Admitting: *Deleted

## 2012-09-07 MED ORDER — RABEPRAZOLE SODIUM 20 MG PO TBEC: 20.0000 mg | DELAYED_RELEASE_TABLET | Freq: Every day | ORAL | Status: DC

## 2012-09-08 DIAGNOSIS — D539 Nutritional anemia, unspecified: Secondary | ICD-10-CM

## 2012-09-08 HISTORY — DX: Nutritional anemia, unspecified: D53.9

## 2012-10-04 ENCOUNTER — Ambulatory Visit (INDEPENDENT_AMBULATORY_CARE_PROVIDER_SITE_OTHER): Payer: Medicare Other | Admitting: General Practice

## 2012-10-04 ENCOUNTER — Telehealth: Payer: Self-pay | Admitting: *Deleted

## 2012-10-04 DIAGNOSIS — Z7901 Long term (current) use of anticoagulants: Secondary | ICD-10-CM | POA: Diagnosis not present

## 2012-10-04 DIAGNOSIS — I82409 Acute embolism and thrombosis of unspecified deep veins of unspecified lower extremity: Secondary | ICD-10-CM

## 2012-10-04 DIAGNOSIS — Z5181 Encounter for therapeutic drug level monitoring: Secondary | ICD-10-CM | POA: Diagnosis not present

## 2012-10-04 LAB — POCT INR: INR: 1.4

## 2012-10-04 NOTE — Telephone Encounter (Signed)
Form for handicapped placard in your IN box.

## 2012-10-04 NOTE — Telephone Encounter (Signed)
Filled and placed in Kim's box. 

## 2012-10-04 NOTE — Telephone Encounter (Signed)
Message left notifying patient. Form placed up front for pick up. 

## 2012-10-14 ENCOUNTER — Ambulatory Visit (INDEPENDENT_AMBULATORY_CARE_PROVIDER_SITE_OTHER): Payer: Medicare Other | Admitting: General Practice

## 2012-10-14 DIAGNOSIS — Z5181 Encounter for therapeutic drug level monitoring: Secondary | ICD-10-CM

## 2012-10-14 DIAGNOSIS — Z7901 Long term (current) use of anticoagulants: Secondary | ICD-10-CM

## 2012-10-14 DIAGNOSIS — I82409 Acute embolism and thrombosis of unspecified deep veins of unspecified lower extremity: Secondary | ICD-10-CM | POA: Diagnosis not present

## 2012-11-02 DIAGNOSIS — L578 Other skin changes due to chronic exposure to nonionizing radiation: Secondary | ICD-10-CM | POA: Diagnosis not present

## 2012-11-02 DIAGNOSIS — D239 Other benign neoplasm of skin, unspecified: Secondary | ICD-10-CM | POA: Diagnosis not present

## 2012-11-02 DIAGNOSIS — L570 Actinic keratosis: Secondary | ICD-10-CM | POA: Diagnosis not present

## 2012-11-02 DIAGNOSIS — L821 Other seborrheic keratosis: Secondary | ICD-10-CM | POA: Diagnosis not present

## 2012-11-02 DIAGNOSIS — L820 Inflamed seborrheic keratosis: Secondary | ICD-10-CM | POA: Diagnosis not present

## 2012-11-04 ENCOUNTER — Ambulatory Visit (INDEPENDENT_AMBULATORY_CARE_PROVIDER_SITE_OTHER): Payer: Medicare Other | Admitting: General Practice

## 2012-11-04 DIAGNOSIS — Z7901 Long term (current) use of anticoagulants: Secondary | ICD-10-CM | POA: Diagnosis not present

## 2012-11-04 DIAGNOSIS — I82409 Acute embolism and thrombosis of unspecified deep veins of unspecified lower extremity: Secondary | ICD-10-CM | POA: Diagnosis not present

## 2012-11-04 DIAGNOSIS — Z5181 Encounter for therapeutic drug level monitoring: Secondary | ICD-10-CM

## 2012-12-06 ENCOUNTER — Ambulatory Visit (INDEPENDENT_AMBULATORY_CARE_PROVIDER_SITE_OTHER): Payer: Medicare Other | Admitting: General Practice

## 2012-12-06 ENCOUNTER — Ambulatory Visit (INDEPENDENT_AMBULATORY_CARE_PROVIDER_SITE_OTHER): Payer: Medicare Other | Admitting: Family Medicine

## 2012-12-06 ENCOUNTER — Encounter: Payer: Self-pay | Admitting: Family Medicine

## 2012-12-06 VITALS — BP 110/58 | HR 60 | Temp 98.2°F | Resp 16 | Wt 174.5 lb

## 2012-12-06 DIAGNOSIS — E785 Hyperlipidemia, unspecified: Secondary | ICD-10-CM

## 2012-12-06 DIAGNOSIS — I82409 Acute embolism and thrombosis of unspecified deep veins of unspecified lower extremity: Secondary | ICD-10-CM | POA: Diagnosis not present

## 2012-12-06 DIAGNOSIS — G252 Other specified forms of tremor: Secondary | ICD-10-CM | POA: Diagnosis not present

## 2012-12-06 DIAGNOSIS — Z5181 Encounter for therapeutic drug level monitoring: Secondary | ICD-10-CM | POA: Diagnosis not present

## 2012-12-06 DIAGNOSIS — G250 Essential tremor: Secondary | ICD-10-CM

## 2012-12-06 DIAGNOSIS — R7309 Other abnormal glucose: Secondary | ICD-10-CM

## 2012-12-06 DIAGNOSIS — I2510 Atherosclerotic heart disease of native coronary artery without angina pectoris: Secondary | ICD-10-CM | POA: Diagnosis not present

## 2012-12-06 DIAGNOSIS — R7303 Prediabetes: Secondary | ICD-10-CM

## 2012-12-06 DIAGNOSIS — Z7901 Long term (current) use of anticoagulants: Secondary | ICD-10-CM | POA: Diagnosis not present

## 2012-12-06 LAB — COMPREHENSIVE METABOLIC PANEL
ALT: 28 U/L (ref 0–53)
CO2: 27 mEq/L (ref 19–32)
Calcium: 9 mg/dL (ref 8.4–10.5)
Chloride: 103 mEq/L (ref 96–112)
Creatinine, Ser: 1.6 mg/dL — ABNORMAL HIGH (ref 0.4–1.5)
GFR: 43.24 mL/min — ABNORMAL LOW (ref >60.00)
Sodium: 138 mEq/L (ref 135–145)
Total Protein: 6.1 g/dL (ref 6.0–8.3)

## 2012-12-06 LAB — POCT INR: INR: 2

## 2012-12-06 NOTE — Progress Notes (Signed)
Changed to no charge as pt seen today by myself.

## 2012-12-06 NOTE — Assessment & Plan Note (Signed)
Chronic, stable. compliant with meds.

## 2012-12-06 NOTE — Assessment & Plan Note (Signed)
Chronic, stable. Check Cr today.

## 2012-12-06 NOTE — Progress Notes (Signed)
  Subjective:    Patient ID: [name], male    DOB: 1930/10/12, 79 y.o.   MRN: [id number]  HPI CC: 6 mo f/u  ET - started gabapentin 05/2012, slowly titrated up to 100mg  twice daily.  Notices some improvement.  More trouble with tremors at night time with dinner.  HLD -  On lipitor and tricor - no myalgias.  Prediabetes - discussed diet.  Some nonadherence to diet.  H/o CAD - 30% blockage 1 vessel at catheterization done 2000, but had stress test myoview done 11/2010 WNL, no ischemia.  Coumadin check at coumadin clinic today -  Lab Results  Component Value Date   INR 2.0 12/06/2012   INR 2.3 11/04/2012   INR 2.1 10/14/2012    Past Medical History  Diagnosis Date  . GERD (gastroesophageal reflux disease)     h/o PUD  . CAD (coronary artery disease)     cath 2000 30% single vessel, normal nuclear stress test 12/03/2010, no evidence ischemia  . Renal insufficiency     baseline Cr 1.7  . Depression   . Hx pulmonary embolism 08/1991    coumadin  . History of phlebitis   . Allergic rhinitis   . Urge incontinence   . Elevated PSA     previous-normalized (followed by Dr. Annabell Howells, rec no repeat unless urinary sxs)  . Anemia   . HLD (hyperlipidemia)     hypertriglyceridemia  . Asthma     remote  . Arthritis   . Blood transfusion 1990's  . Schatzki's ring 02/2012    s/p dilation Christella Hartigan)  . History of fracture of right hip   . Hearing loss 06/2012    eval - rec annual exam  . Fall against object     against water cooler 06/29/12  . Essential tremor 09/27/2008  . Prediabetes     Review of Systems Per HPI    Objective:   Physical Exam  Nursing note and vitals reviewed. Constitutional: He appears well-developed and well-nourished. No distress.  HENT:  Head: Normocephalic and atraumatic.  Mouth/Throat: Oropharynx is clear and moist. No oropharyngeal exudate.  Eyes: Conjunctivae and EOM are normal. Pupils are equal, round, and reactive to light.  Neck: Normal range of  motion. Neck supple. Carotid bruit is not present.  Cardiovascular: Normal rate, regular rhythm, normal heart sounds and intact distal pulses.   No murmur heard. Pulmonary/Chest: Effort normal and breath sounds normal. No respiratory distress. He has no wheezes. He has no rales.  Musculoskeletal: He exhibits no edema.  Psychiatric: He has a normal mood and affect.  Minimal action tremor       Assessment & Plan:

## 2012-12-06 NOTE — Assessment & Plan Note (Signed)
Discussed change PM gabapentin dose to late afternoon to provide coverage during dinner, vs add 3rd tablet at noon. Off metoprolol.

## 2012-12-06 NOTE — Patient Instructions (Signed)
You are doing well today! Let's do trial off flomax.  If voiding stable, we don't need this medicine. May change around gabapentin or add on a 3rd pill each day - as needed. Baseline EKG today. Blood work today. Return in 6 months for medicare wellness visit, prior fasting for blood work.

## 2012-12-06 NOTE — Assessment & Plan Note (Addendum)
Will update EKG today as no baseline in chart. Mild by cath 2000, normal stress test 11/2010. No sxs today.  EKG - NSR rate 60s, LAD, normal intervals, no acute ST/T changes, abnormal V2 ?lead placement

## 2012-12-06 NOTE — Assessment & Plan Note (Signed)
Discussed importance of monitoring diet to prevent progression of prediabetes.

## 2012-12-07 ENCOUNTER — Encounter: Payer: Self-pay | Admitting: *Deleted

## 2012-12-07 ENCOUNTER — Telehealth: Payer: Self-pay | Admitting: Family Medicine

## 2012-12-07 MED ORDER — ALBUTEROL SULFATE HFA 108 (90 BASE) MCG/ACT IN AERS: 2.0000 | INHALATION_SPRAY | Freq: Four times a day (QID) | RESPIRATORY_TRACT | Status: DC | PRN

## 2012-12-07 NOTE — Addendum Note (Signed)
Addended by: Eustaquio Boyden on: 12/07/2012 02:04 PM   Modules accepted: Orders

## 2012-12-07 NOTE — Telephone Encounter (Signed)
[facility] Triage Call Report Triage Record Num: [id number] Operator: Chevis Pretty Patient Name: [name] Call Date & Time: 12/06/2012 5:42:53PM Patient Phone: [phone] PCP: Eustaquio Boyden Patient Gender: Male PCP Fax : [phone] Patient DOB: 07/16/1931 Practice Name: [facility] Reason for Call: Caller: Titty/Spouse; PCP: Eustaquio Boyden [facility]); CB#: [phone]; Call regarding Asthma; states had attack start 12/06/12 1500. States seen in office AM 12/06/12 for 6 month follow up check and lungs were clear and he felt quite well. States after he returned home for the afternoon, he felt tight. States it has been ~ 8 years since an attack, and has no current Rx for rescue inhaler available. States the pollen has been causing some problems. Did not have inhaler to use, but states he is breathing well and has no cough currently. Denies current distress; per protocol, advised follow up within 72 hours. Unable to call in short term inhaler refill, as patient has not used it in past year; care measures advised, with callback parameters given. Protocol(s) Used: Asthma - Adult Recommended Outcome per Protocol: See Provider within 72 Hours Reason for Outcome: Recurring episodes (having symptoms twice a week or more often) of cough, wheezing, chest tightness, feeling of breathlessness that affects ability to do some, but not all, of usual activities and has not had a recent asthma exam. Care Advice: ~ Avoid any activity that produces symptoms until evaluated by provider. Some prescription medications (ACE inhibitors, beta blockers) and over-the-counter medications (NSAIDs) may trigger or worsen asthma symptoms. Talk with your provider if you are taking any of these medications. ~ Try to identify possible situations or irritants that triggered your symptoms (including medications) and be sure to tell your provider. ~ Call provider if symptoms worsen or do  not improve with usual treatment or you need more medication than recommended to relieve the symptoms. ~ ~ SYMPTOM / CONDITION MANAGEMENT ~ CAUTIONS ~ List, or take, all current prescription(s), nonprescription or alternative medication(s) to provider for evaluation. 12/06/2012 5:52:08PM Page 1 of 1 [email or web address]

## 2012-12-07 NOTE — Telephone Encounter (Addendum)
pts wife said pt has allergies to trees and grass and stress; these symptoms can start asthma problem. Pt was seen 12/06/12 with no breathing problem and last night started feeling tight in chest; pt took allegra. Pt got relief from tightness in chest last night after taking allegra. Today 12/07/12 pt is feeling OK but request inhaler to have on hand if needed. pts wife request albuterol inhaler sent to [facility].Please advise.

## 2012-12-07 NOTE — Telephone Encounter (Signed)
plz notify albuterol inhaler sent in.

## 2012-12-07 NOTE — Telephone Encounter (Signed)
Spoke with patient and advised results   

## 2012-12-28 DIAGNOSIS — S336XXA Sprain of sacroiliac joint, initial encounter: Secondary | ICD-10-CM | POA: Diagnosis not present

## 2012-12-28 DIAGNOSIS — S338XXA Sprain of other parts of lumbar spine and pelvis, initial encounter: Secondary | ICD-10-CM | POA: Diagnosis not present

## 2012-12-28 DIAGNOSIS — M999 Biomechanical lesion, unspecified: Secondary | ICD-10-CM | POA: Diagnosis not present

## 2012-12-28 DIAGNOSIS — M5137 Other intervertebral disc degeneration, lumbosacral region: Secondary | ICD-10-CM | POA: Diagnosis not present

## 2012-12-29 DIAGNOSIS — M5137 Other intervertebral disc degeneration, lumbosacral region: Secondary | ICD-10-CM | POA: Diagnosis not present

## 2012-12-29 DIAGNOSIS — S336XXA Sprain of sacroiliac joint, initial encounter: Secondary | ICD-10-CM | POA: Diagnosis not present

## 2012-12-29 DIAGNOSIS — M999 Biomechanical lesion, unspecified: Secondary | ICD-10-CM | POA: Diagnosis not present

## 2012-12-29 DIAGNOSIS — S338XXA Sprain of other parts of lumbar spine and pelvis, initial encounter: Secondary | ICD-10-CM | POA: Diagnosis not present

## 2013-01-03 ENCOUNTER — Ambulatory Visit (INDEPENDENT_AMBULATORY_CARE_PROVIDER_SITE_OTHER): Payer: Medicare Other | Admitting: General Practice

## 2013-01-03 DIAGNOSIS — Z5181 Encounter for therapeutic drug level monitoring: Secondary | ICD-10-CM | POA: Diagnosis not present

## 2013-01-03 DIAGNOSIS — S336XXA Sprain of sacroiliac joint, initial encounter: Secondary | ICD-10-CM | POA: Diagnosis not present

## 2013-01-03 DIAGNOSIS — M5137 Other intervertebral disc degeneration, lumbosacral region: Secondary | ICD-10-CM | POA: Diagnosis not present

## 2013-01-03 DIAGNOSIS — M999 Biomechanical lesion, unspecified: Secondary | ICD-10-CM | POA: Diagnosis not present

## 2013-01-03 DIAGNOSIS — Z7901 Long term (current) use of anticoagulants: Secondary | ICD-10-CM

## 2013-01-03 DIAGNOSIS — I82409 Acute embolism and thrombosis of unspecified deep veins of unspecified lower extremity: Secondary | ICD-10-CM | POA: Diagnosis not present

## 2013-01-03 DIAGNOSIS — S338XXA Sprain of other parts of lumbar spine and pelvis, initial encounter: Secondary | ICD-10-CM | POA: Diagnosis not present

## 2013-01-06 ENCOUNTER — Encounter: Payer: Self-pay | Admitting: Family Medicine

## 2013-01-07 ENCOUNTER — Encounter: Payer: Self-pay | Admitting: *Deleted

## 2013-01-07 DIAGNOSIS — M5137 Other intervertebral disc degeneration, lumbosacral region: Secondary | ICD-10-CM | POA: Diagnosis not present

## 2013-01-07 DIAGNOSIS — S336XXA Sprain of sacroiliac joint, initial encounter: Secondary | ICD-10-CM | POA: Diagnosis not present

## 2013-01-07 DIAGNOSIS — M999 Biomechanical lesion, unspecified: Secondary | ICD-10-CM | POA: Diagnosis not present

## 2013-01-07 DIAGNOSIS — S338XXA Sprain of other parts of lumbar spine and pelvis, initial encounter: Secondary | ICD-10-CM | POA: Diagnosis not present

## 2013-01-07 NOTE — Telephone Encounter (Signed)
See My chart message

## 2013-01-12 DIAGNOSIS — S336XXA Sprain of sacroiliac joint, initial encounter: Secondary | ICD-10-CM | POA: Diagnosis not present

## 2013-01-12 DIAGNOSIS — S338XXA Sprain of other parts of lumbar spine and pelvis, initial encounter: Secondary | ICD-10-CM | POA: Diagnosis not present

## 2013-01-12 DIAGNOSIS — M999 Biomechanical lesion, unspecified: Secondary | ICD-10-CM | POA: Diagnosis not present

## 2013-01-12 DIAGNOSIS — M5137 Other intervertebral disc degeneration, lumbosacral region: Secondary | ICD-10-CM | POA: Diagnosis not present

## 2013-01-14 DIAGNOSIS — M999 Biomechanical lesion, unspecified: Secondary | ICD-10-CM | POA: Diagnosis not present

## 2013-01-14 DIAGNOSIS — M5137 Other intervertebral disc degeneration, lumbosacral region: Secondary | ICD-10-CM | POA: Diagnosis not present

## 2013-01-14 DIAGNOSIS — S336XXA Sprain of sacroiliac joint, initial encounter: Secondary | ICD-10-CM | POA: Diagnosis not present

## 2013-01-14 DIAGNOSIS — S338XXA Sprain of other parts of lumbar spine and pelvis, initial encounter: Secondary | ICD-10-CM | POA: Diagnosis not present

## 2013-01-17 ENCOUNTER — Encounter: Payer: Self-pay | Admitting: Family Medicine

## 2013-01-17 DIAGNOSIS — S336XXA Sprain of sacroiliac joint, initial encounter: Secondary | ICD-10-CM | POA: Diagnosis not present

## 2013-01-17 DIAGNOSIS — S338XXA Sprain of other parts of lumbar spine and pelvis, initial encounter: Secondary | ICD-10-CM | POA: Diagnosis not present

## 2013-01-17 DIAGNOSIS — M999 Biomechanical lesion, unspecified: Secondary | ICD-10-CM | POA: Diagnosis not present

## 2013-01-17 DIAGNOSIS — M5137 Other intervertebral disc degeneration, lumbosacral region: Secondary | ICD-10-CM | POA: Diagnosis not present

## 2013-01-19 DIAGNOSIS — S338XXA Sprain of other parts of lumbar spine and pelvis, initial encounter: Secondary | ICD-10-CM | POA: Diagnosis not present

## 2013-01-19 DIAGNOSIS — M999 Biomechanical lesion, unspecified: Secondary | ICD-10-CM | POA: Diagnosis not present

## 2013-01-19 DIAGNOSIS — S336XXA Sprain of sacroiliac joint, initial encounter: Secondary | ICD-10-CM | POA: Diagnosis not present

## 2013-01-19 DIAGNOSIS — M5137 Other intervertebral disc degeneration, lumbosacral region: Secondary | ICD-10-CM | POA: Diagnosis not present

## 2013-01-20 DIAGNOSIS — S338XXA Sprain of other parts of lumbar spine and pelvis, initial encounter: Secondary | ICD-10-CM | POA: Diagnosis not present

## 2013-01-20 DIAGNOSIS — M999 Biomechanical lesion, unspecified: Secondary | ICD-10-CM | POA: Diagnosis not present

## 2013-01-20 DIAGNOSIS — S336XXA Sprain of sacroiliac joint, initial encounter: Secondary | ICD-10-CM | POA: Diagnosis not present

## 2013-01-20 DIAGNOSIS — M5137 Other intervertebral disc degeneration, lumbosacral region: Secondary | ICD-10-CM | POA: Diagnosis not present

## 2013-01-24 ENCOUNTER — Ambulatory Visit: Payer: Medicare Other | Admitting: Gastroenterology

## 2013-01-24 ENCOUNTER — Encounter: Payer: Self-pay | Admitting: Gastroenterology

## 2013-01-24 ENCOUNTER — Ambulatory Visit (INDEPENDENT_AMBULATORY_CARE_PROVIDER_SITE_OTHER): Payer: Medicare Other | Admitting: Gastroenterology

## 2013-01-24 ENCOUNTER — Ambulatory Visit: Payer: Medicare Other

## 2013-01-24 ENCOUNTER — Ambulatory Visit (INDEPENDENT_AMBULATORY_CARE_PROVIDER_SITE_OTHER): Payer: Medicare Other | Admitting: General Practice

## 2013-01-24 VITALS — BP 124/62 | HR 68 | Ht 65.0 in | Wt 172.0 lb

## 2013-01-24 DIAGNOSIS — Z5181 Encounter for therapeutic drug level monitoring: Secondary | ICD-10-CM

## 2013-01-24 DIAGNOSIS — Z7901 Long term (current) use of anticoagulants: Secondary | ICD-10-CM

## 2013-01-24 DIAGNOSIS — K219 Gastro-esophageal reflux disease without esophagitis: Secondary | ICD-10-CM | POA: Diagnosis not present

## 2013-01-24 DIAGNOSIS — I82409 Acute embolism and thrombosis of unspecified deep veins of unspecified lower extremity: Secondary | ICD-10-CM

## 2013-01-24 NOTE — Progress Notes (Signed)
Review of pertinent gastrointestinal problems:  1. several adenomatous polyps removed from his colon November 2011, recommended for repeat colonoscopy at 3 year interval  2. chronic loose stools, colonoscopy November 2011 to the terminal ileum was normal except for polyps. Random colon biopsies showed no microscopic colitis 3. Dysphagia: 01/2012 barium esophagram suggested a slight stricture at the GE junction and he will need EGD with dilation for this. However the esophagram also suggests laryngeal penetration, probable aspiration. the radiologist suggested formal modi modified barium swallow study with speech therapy.  This was done 02/2012: The MBSS suggest that the main problem is from the esophageal stricture (probably does not also have oropharyngeal swallowing problem).  EGD 02/2012 found and dilated Schatki's ring (up to 20mm).  This helped but only briefly, repeat EGD 07/2012 same finding, dilated to 20mm for 2 minutes.  This very much helped. 4. Epigastric pain: 2011 CT, then [place] then HIDA were all essentially unrevealing.   HPI: This is a very pleasant 79 year old man whom I last saw about a year ago. He is here with his wife today.   Has been having epigastric burning sensation.  Usually a cup of pepto will help.  Had to do that twice recently. The burning occurs usually nightly, if he overeats.  He has started eating yogurt that helps.  Mild associated nausea.  No particular foods.    Takes no NSAIDs.  Only tylenol, periodically.  Takes aciphex daily, if he misses then burning is worse.  He tried OTC prilosec briefly but didn't help as much.  Worst burning is after dinner. No bothered at bedtime.  No overt GI bleeding.    Past Medical History  Diagnosis Date  . GERD (gastroesophageal reflux disease)     h/o PUD  . CAD (coronary artery disease)     cath 2000 30% single vessel, normal nuclear stress test 12/03/2010, no evidence ischemia  . Renal insufficiency     baseline Cr 1.7   . Depression   . Hx pulmonary embolism 08/1991    coumadin  . History of phlebitis   . Allergic rhinitis   . Urge incontinence   . Elevated PSA     previous-normalized (followed by Dr. Annabell Howells, rec no repeat unless urinary sxs)  . Anemia   . HLD (hyperlipidemia)     hypertriglyceridemia  . Asthma     remote  . Arthritis   . Blood transfusion 1990's  . Schatzki's ring 02/2012    s/p dilation Christella Hartigan)  . History of fracture of right hip   . Hearing loss 06/2012    eval - rec annual exam  . Fall against object     against water cooler 06/29/12  . Essential tremor 09/27/2008  . Prediabetes     Past Surgical History  Procedure Laterality Date  . Tonsillectomy  1965  . Hernia repair  1979    Left  . Cystoscopy  1992    for kidney stones  . Exploratory laparotomy  1996    with incidental appendectomy  . Appendectomy  1996  . Cardiac catheterization  2000    30% one vessel  . Knee arthroscopy  03/24/02    Right (Dr. Gerrit Heck)  . Orif femoral neck fracture w/ dhs  08/03/02    Right (Dr. Gerrit Heck)  . Ct abd w & pelvis wo cm  07/2001    Scarring of right lung, stable negative o/w  . Ct abd w & pelvis wo cm  11/2000    ?  stones, right LL scarring  . V/q scan  06/1999    negative  . [place]oscopy  1998    [place]  . US echocardiography  12/28/2007    Mild aortic valve calcification EF 55%, basically nml  . Carotid u/s  12/28/2007    nml  . Eeg  11/12/2009    nml  . Mri  11/2009    Head, nml  . Cataract extraction  Jan, March 2011    bilateral  . Colonoscopy  07/2010    5 polyps, adenomatous, rec rpt 3 yrs  . Esophagogastroduodenoscopy  02/2012    dilation of schatzki's ring    Current Outpatient Prescriptions  Medication Sig Dispense Refill  . albuterol (PROVENTIL HFA;VENTOLIN HFA) 108 (90 BASE) MCG/ACT inhaler Inhale 2 puffs into the lungs every 6 (six) hours as needed for wheezing or shortness of breath.  1 Inhaler  3  . atorvastatin (LIPITOR) 40 MG tablet TAKE 1  TABLET AT BEDTIME  90 tablet  3  . cholecalciferol (VITAMIN D) 1000 UNITS tablet Take 1,000 Units by mouth every other day.       . Coenzyme Q10 (COQ-10) 100 MG CAPS Take 1 capsule by mouth at bedtime.       . Cyanocobalamin (B-12 PO) Take 1 tablet by mouth every other day.      . fenofibrate (TRICOR) 145 MG tablet TAKE 1 TABLET DAILY  90 tablet  3  . fexofenadine (ALLEGRA) 180 MG tablet Take 180 mg by mouth daily as needed.       . gabapentin (NEURONTIN) 100 MG capsule Take 1 capsule (100 mg total) by mouth 2 (two) times daily.  180 capsule  3  . Multiple Vitamins-Minerals (MULTIVITAMIN PO) Take 1 tablet by mouth daily.      Marland Kitchen oxybutynin (DITROPAN) 5 MG tablet Take 1 tablet (5 mg total) by mouth 2 (two) times daily as needed.  180 tablet  1  . RABEprazole (ACIPHEX) 20 MG tablet Take 1 tablet (20 mg total) by mouth daily.  90 tablet  3  . Tamsulosin HCl (FLOMAX) 0.4 MG CAPS Take 1 capsule (0.4 mg total) by mouth daily.  30 capsule  0  . valACYclovir (VALTREX) 1000 MG tablet 1000mg  twice daily for 1 day as needed  10 tablet  1  . venlafaxine (EFFEXOR) 75 MG tablet TAKE 1 TABLET DAILY  90 tablet  3  . warfarin (COUMADIN) 3 MG tablet TAKE 1 TABLET AS DIRECTED  90 tablet  3   No current facility-administered medications for this visit.    Allergies as of 01/24/2013 - Review Complete 01/24/2013  Allergen Reaction Noted  . Nizatidine    . Penicillins    . Sulfadiazine      Family History  Problem Relation Age of Onset  . Stroke Mother   . Hypertension Mother   . Cancer Brother     prostate with mets  . Blindness Brother     legally  . Diabetes Brother   . Pulmonary embolism Sister     from shoulder operation  . Alcohol abuse Brother   . Colon cancer Neg Hx   . Esophageal cancer Neg Hx   . Rectal cancer Neg Hx   . Stomach cancer Neg Hx     History   Social History  . Marital Status: Married    Spouse Name: N/A    Number of Children: 2  . Years of Education: N/A    Occupational History  . 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 Smoking status: Never Smoker   . Smokeless tobacco: Never Used  . Alcohol Use: No  . Drug Use: No  . Sexually Active: Not on file   Other Topics Concern  . Not on file   Social History Narrative   Married with 2 children   Retired: was principal   Activity: walks dog 2-3 times daily about , frequent stops   Diet: fish 1x/wk      Physical Exam: BP 124/62  Pulse 68  Ht 5\' 5"  (1.651 m)  Wt 172 lb (78.019 kg)  BMI 28.62 kg/m2 Constitutional: generally well-appearing Psychiatric: alert and oriented x3 Abdomen: soft, nontender, nondistended, no obvious ascites, no peritoneal signs, normal bowel sounds     Assessment and plan: 79 y.o. male with intermittent postprandial epigastric burning  I think this is likely acid related symptom. He is not taking his proton pump inhibitor at the current time relation to meals he will change that. He will also add either Pepcid or Zantac nightly with his dinner meal since his post dinner burning his most bothersome. He will call to report on his symptoms in 3-4 weeks and sooner if needed.

## 2013-01-24 NOTE — Patient Instructions (Addendum)
You should change the way you are taking your antiacid medicine (aciphex) so that you are taking it 20-30 minutes prior to a decent meal as that is the way the pill is designed to work most effectively. Start OTC pepcid or zantac: one pill taken with your dinner meal daily. Call to report on your symptoms in 3-4 weeks.                                              We are excited to introduce MyChart, a new best-in-class service that provides you online access to important information in your electronic medical record. We want to make it easier for you to view your health information - all in one secure location - when and where you need it. We expect MyChart will enhance the quality of care and service we provide.  When you register for MyChart, you can:    View your test results.    Request appointments and receive appointment reminders via email.    Request medication renewals.    View your medical history, allergies, medications and immunizations.    Communicate with your physician's office through a password-protected site.    Conveniently print information such as your medication lists.  To find out if MyChart is right for you, please talk to a member of our clinical staff today. We will gladly answer your questions about this free health and wellness tool.  If you are age 77 or older and want a member of your family to have access to your record, you must provide written consent by completing a proxy form available at our office. Please speak to our clinical staff about guidelines regarding accounts for patients younger than age 40.  As you activate your MyChart account and need any technical assistance, please call the MyChart technical support line at [phone]) or email your question to [email or web address]. If you email your question(s), please include your name, a return phone number and the best time to reach you.  If you have non-urgent health-related  questions, you can send a message to our office through MyChart at [email or web address]. If you have a medical emergency, call 911.  Thank you for using MyChart as your new health and wellness resource!   MyChart licensed from [employer],  [id number]. Patents Pending.

## 2013-01-25 DIAGNOSIS — M999 Biomechanical lesion, unspecified: Secondary | ICD-10-CM | POA: Diagnosis not present

## 2013-01-25 DIAGNOSIS — M5137 Other intervertebral disc degeneration, lumbosacral region: Secondary | ICD-10-CM | POA: Diagnosis not present

## 2013-01-25 DIAGNOSIS — S338XXA Sprain of other parts of lumbar spine and pelvis, initial encounter: Secondary | ICD-10-CM | POA: Diagnosis not present

## 2013-01-25 DIAGNOSIS — S336XXA Sprain of sacroiliac joint, initial encounter: Secondary | ICD-10-CM | POA: Diagnosis not present

## 2013-01-27 DIAGNOSIS — M999 Biomechanical lesion, unspecified: Secondary | ICD-10-CM | POA: Diagnosis not present

## 2013-01-27 DIAGNOSIS — S338XXA Sprain of other parts of lumbar spine and pelvis, initial encounter: Secondary | ICD-10-CM | POA: Diagnosis not present

## 2013-01-27 DIAGNOSIS — M5137 Other intervertebral disc degeneration, lumbosacral region: Secondary | ICD-10-CM | POA: Diagnosis not present

## 2013-01-27 DIAGNOSIS — S336XXA Sprain of sacroiliac joint, initial encounter: Secondary | ICD-10-CM | POA: Diagnosis not present

## 2013-01-28 ENCOUNTER — Ambulatory Visit (INDEPENDENT_AMBULATORY_CARE_PROVIDER_SITE_OTHER): Payer: Medicare Other | Admitting: Family Medicine

## 2013-01-28 ENCOUNTER — Encounter: Payer: Self-pay | Admitting: Family Medicine

## 2013-01-28 VITALS — BP 112/70 | HR 74 | Temp 98.6°F | Wt 171.8 lb

## 2013-01-28 DIAGNOSIS — K219 Gastro-esophageal reflux disease without esophagitis: Secondary | ICD-10-CM | POA: Diagnosis not present

## 2013-01-28 DIAGNOSIS — G250 Essential tremor: Secondary | ICD-10-CM

## 2013-01-28 DIAGNOSIS — R42 Dizziness and giddiness: Secondary | ICD-10-CM

## 2013-01-28 MED ORDER — GABAPENTIN 100 MG PO CAPS: 100.0000 mg | ORAL_CAPSULE | Freq: Two times a day (BID) | ORAL | Status: DC

## 2013-01-28 NOTE — Assessment & Plan Note (Signed)
Trial of increased gabapentin dose - discussed sedation and dizziness precautions

## 2013-01-28 NOTE — Progress Notes (Signed)
  Subjective:    Patient ID: [name], male    DOB: 1930/09/14, 79 y.o.   MRN: [id number]  HPI CC: woozy  epigastric abd pain - saw Dr. Gerilyn Pilgrim on Monday for exacerbation of GERD, suggested zantac at night and aciphex in am - noticed improved sxs with this regimen.  ET- on gabapentin 100mg  bid.  Interested in increased dose.  Dizziness - described as room spinning mostly at rest.  Notes more when laying down or with rapid head position changes.  Tends to get intermittent episodes of vertigo chronically. No recent viral sxs.  No hearing changes, no headaches recently.  Woozy on feet, but notes more vertigo at rest. Allergies acting up recently  Past Medical History  Diagnosis Date  . GERD (gastroesophageal reflux disease)     h/o PUD  . CAD (coronary artery disease)     cath 2000 30% single vessel, normal nuclear stress test 12/03/2010, no evidence ischemia  . Renal insufficiency     baseline Cr 1.7  . Depression   . Hx pulmonary embolism 08/1991    coumadin  . History of phlebitis   . Allergic rhinitis   . Urge incontinence   . Elevated PSA     previous-normalized (followed by Dr. Annabell Howells, rec no repeat unless urinary sxs)  . Anemia   . HLD (hyperlipidemia)     hypertriglyceridemia  . Asthma     remote  . Arthritis   . Blood transfusion 1990's  . Schatzki's ring 02/2012    s/p dilation Christella Hartigan)  . History of fracture of right hip   . Hearing loss 06/2012    eval - rec annual exam  . Fall against object     against water cooler 06/29/12  . Essential tremor 09/27/2008  . Prediabetes      Review of Systems Per HPI    Objective:   Physical Exam  Nursing note and vitals reviewed. Constitutional: He appears well-developed and well-nourished. No distress.  HENT:  Head: Normocephalic and atraumatic.  Mouth/Throat: Oropharynx is clear and moist. No oropharyngeal exudate.  Eyes: Conjunctivae and EOM are normal. Pupils are equal, round, and reactive to light. No scleral  icterus.  Neck: Normal range of motion. Neck supple. Carotid bruit is not present.  Cardiovascular: Normal rate, regular rhythm, normal heart sounds and intact distal pulses.   No murmur heard. Pulmonary/Chest: Effort normal and breath sounds normal. No respiratory distress. He has no wheezes. He has no rales.  Neurological:  dix hallpike positive bilaterally - worse on right Left - horizontal nystagmus Right - rotational nystagmus      Assessment & Plan:

## 2013-01-28 NOTE — Assessment & Plan Note (Signed)
Per wife gets these episodes of transient vertigo.  Exam today with positive dix hallpike - ?BPV.  Will monitor for now, consider meclizine if persistent sxs. Doubt central cause. Pt/wife agree with plan.

## 2013-01-28 NOTE — Patient Instructions (Addendum)
I've increased amount of gabapentin.  May try 3rd pill.  Watch dizziness on higher dose. Let's watch hydration status. Orthostatics today. Let me know if vertigo (room spinning sensation) doesn't improve in next few days.

## 2013-01-28 NOTE — Assessment & Plan Note (Signed)
Improved on aciphex in am and zantact at night.

## 2013-02-01 DIAGNOSIS — M5137 Other intervertebral disc degeneration, lumbosacral region: Secondary | ICD-10-CM | POA: Diagnosis not present

## 2013-02-01 DIAGNOSIS — S338XXA Sprain of other parts of lumbar spine and pelvis, initial encounter: Secondary | ICD-10-CM | POA: Diagnosis not present

## 2013-02-01 DIAGNOSIS — M999 Biomechanical lesion, unspecified: Secondary | ICD-10-CM | POA: Diagnosis not present

## 2013-02-01 DIAGNOSIS — S336XXA Sprain of sacroiliac joint, initial encounter: Secondary | ICD-10-CM | POA: Diagnosis not present

## 2013-02-03 DIAGNOSIS — M5137 Other intervertebral disc degeneration, lumbosacral region: Secondary | ICD-10-CM | POA: Diagnosis not present

## 2013-02-03 DIAGNOSIS — M999 Biomechanical lesion, unspecified: Secondary | ICD-10-CM | POA: Diagnosis not present

## 2013-02-03 DIAGNOSIS — S338XXA Sprain of other parts of lumbar spine and pelvis, initial encounter: Secondary | ICD-10-CM | POA: Diagnosis not present

## 2013-02-03 DIAGNOSIS — S336XXA Sprain of sacroiliac joint, initial encounter: Secondary | ICD-10-CM | POA: Diagnosis not present

## 2013-02-07 ENCOUNTER — Ambulatory Visit (INDEPENDENT_AMBULATORY_CARE_PROVIDER_SITE_OTHER): Payer: Medicare Other | Admitting: Family Medicine

## 2013-02-07 ENCOUNTER — Encounter: Payer: Self-pay | Admitting: Family Medicine

## 2013-02-07 VITALS — BP 118/64 | HR 76 | Temp 98.0°F | Wt 175.5 lb

## 2013-02-07 DIAGNOSIS — L089 Local infection of the skin and subcutaneous tissue, unspecified: Secondary | ICD-10-CM | POA: Insufficient documentation

## 2013-02-07 MED ORDER — MUPIROCIN CALCIUM 2 % EX CREA: TOPICAL_CREAM | Freq: Three times a day (TID) | CUTANEOUS | Status: DC

## 2013-02-07 NOTE — Assessment & Plan Note (Signed)
In h/o MRSA will treat as such with mupirocin cream. If persistent nasal lesions, to update [place] for mupirocin ointment MRSA nasal treatment.

## 2013-02-07 NOTE — Patient Instructions (Addendum)
Mupirocin for spots on skin. Let me know if not improving as expected.

## 2013-02-07 NOTE — Progress Notes (Signed)
  Subjective:    Patient ID: [name], male    DOB: [birth year], 79 y.o.   MRN: [id number]  HPI CC: "I think it's MRSA"  3d h/o rash on right posterior buttock, then noticed on left posterior shoulder last night (pustule).  Itchy.  Not spreading.  H/o MRSA in past. So far has tried neosporin and heat.  No new meds, foods, no new lotions, detergents, soaps or shampoos. No fevers/chills, nausea, joint pain, oral lesions.  Past Medical History  Diagnosis Date  . GERD (gastroesophageal reflux disease)     h/o PUD  . CAD (coronary artery disease)     cath 2000 30% single vessel, normal nuclear stress test 12/03/2010, no evidence ischemia  . Renal insufficiency     baseline Cr 1.7  . Depression   . Hx pulmonary embolism 08/1991    coumadin  . History of phlebitis   . Allergic rhinitis   . Urge incontinence   . Elevated PSA     previous-normalized (followed by Dr. Annabell Howells, rec no repeat unless urinary sxs)  . Anemia   . HLD (hyperlipidemia)     hypertriglyceridemia  . Asthma     remote  . Arthritis   . Blood transfusion 1990's  . Schatzki's ring 02/2012    s/p dilation Christella Hartigan)  . History of fracture of right hip   . Hearing loss 06/2012    eval - rec annual exam  . Fall against object     against water cooler 06/29/12  . Essential tremor 09/27/2008  . Prediabetes     Review of Systems Per HPI    Objective:   Physical Exam  Nursing note and vitals reviewed. Constitutional: He appears well-developed and well-nourished. No distress.  HENT:  Mouth/Throat: Oropharynx is clear and moist. No oropharyngeal exudate.  R superior interior nare with crusted lesion  Skin: Skin is warm and dry.  Resolving pustule left upper posterior shoulder Cleared pustule right posterior thigh with surrounding erythema      Assessment & Plan:

## 2013-02-09 DIAGNOSIS — S336XXA Sprain of sacroiliac joint, initial encounter: Secondary | ICD-10-CM | POA: Diagnosis not present

## 2013-02-09 DIAGNOSIS — M999 Biomechanical lesion, unspecified: Secondary | ICD-10-CM | POA: Diagnosis not present

## 2013-02-09 DIAGNOSIS — M5137 Other intervertebral disc degeneration, lumbosacral region: Secondary | ICD-10-CM | POA: Diagnosis not present

## 2013-02-09 DIAGNOSIS — S338XXA Sprain of other parts of lumbar spine and pelvis, initial encounter: Secondary | ICD-10-CM | POA: Diagnosis not present

## 2013-02-16 DIAGNOSIS — M999 Biomechanical lesion, unspecified: Secondary | ICD-10-CM | POA: Diagnosis not present

## 2013-02-16 DIAGNOSIS — M5137 Other intervertebral disc degeneration, lumbosacral region: Secondary | ICD-10-CM | POA: Diagnosis not present

## 2013-02-16 DIAGNOSIS — S338XXA Sprain of other parts of lumbar spine and pelvis, initial encounter: Secondary | ICD-10-CM | POA: Diagnosis not present

## 2013-02-16 DIAGNOSIS — S336XXA Sprain of sacroiliac joint, initial encounter: Secondary | ICD-10-CM | POA: Diagnosis not present

## 2013-02-21 ENCOUNTER — Ambulatory Visit (INDEPENDENT_AMBULATORY_CARE_PROVIDER_SITE_OTHER): Payer: Medicare Other | Admitting: Family Medicine

## 2013-02-21 DIAGNOSIS — I82409 Acute embolism and thrombosis of unspecified deep veins of unspecified lower extremity: Secondary | ICD-10-CM | POA: Diagnosis not present

## 2013-02-21 DIAGNOSIS — Z7901 Long term (current) use of anticoagulants: Secondary | ICD-10-CM | POA: Diagnosis not present

## 2013-02-21 DIAGNOSIS — Z5181 Encounter for therapeutic drug level monitoring: Secondary | ICD-10-CM

## 2013-02-21 LAB — POCT INR: INR: 2

## 2013-02-23 DIAGNOSIS — S336XXA Sprain of sacroiliac joint, initial encounter: Secondary | ICD-10-CM | POA: Diagnosis not present

## 2013-02-23 DIAGNOSIS — M999 Biomechanical lesion, unspecified: Secondary | ICD-10-CM | POA: Diagnosis not present

## 2013-02-23 DIAGNOSIS — S338XXA Sprain of other parts of lumbar spine and pelvis, initial encounter: Secondary | ICD-10-CM | POA: Diagnosis not present

## 2013-02-23 DIAGNOSIS — M5137 Other intervertebral disc degeneration, lumbosacral region: Secondary | ICD-10-CM | POA: Diagnosis not present

## 2013-02-25 ENCOUNTER — Encounter: Payer: Self-pay | Admitting: Family Medicine

## 2013-02-25 ENCOUNTER — Ambulatory Visit (INDEPENDENT_AMBULATORY_CARE_PROVIDER_SITE_OTHER): Payer: Medicare Other | Admitting: Family Medicine

## 2013-02-25 ENCOUNTER — Telehealth: Payer: Self-pay | Admitting: Family Medicine

## 2013-02-25 VITALS — BP 110/60 | HR 72 | Temp 98.1°F | Wt 171.8 lb

## 2013-02-25 DIAGNOSIS — R21 Rash and other nonspecific skin eruption: Secondary | ICD-10-CM | POA: Diagnosis not present

## 2013-02-25 MED ORDER — DOXYCYCLINE HYCLATE 100 MG PO CAPS: 100.0000 mg | ORAL_CAPSULE | Freq: Two times a day (BID) | ORAL | Status: DC

## 2013-02-25 NOTE — Telephone Encounter (Signed)
Patient Information:  Caller Name: Nora  Phone: [phone]  Patient: [name], [name]  Gender: Male  DOB: 01/24/1931  Age: 79 Years  PCP: Eustaquio Boyden [facility])  Office Follow Up:  Does the office need to follow up with this patient?: Yes  Instructions For The Office: Please  call back regarding work in appointment.  RN Note:  A3-4" area of redness and swelling surround suspectd tick bite site. Never saw the tick. No appointments remain for 02/25/13.  Message sent to staff for call back regarding work in appointment.    Symptoms  Reason For Call & Symptoms: Bull's eye or target shaped rash on right lower arm with itching noted 02/24/13 with aching in knees.  Reviewed Health History In EMR: Yes  Reviewed Medications In EMR: Yes  Reviewed Allergies In EMR: Yes  Reviewed Surgeries / Procedures: Yes  Date of Onset of Symptoms: 02/24/2013  Treatments Tried: Applied topical Aloevera gel  Treatments Tried Worked: Yes  Guideline(s) Used:  Tick Bite  Disposition Per Guideline:   See Today in Office  Reason For Disposition Reached:   Red ring or bull's-eye rash occurs around a deer tick bite  Advice Given:  Antibiotic Ointment:  Wash the wound and your hands with soap and water after removal to prevent catching any tick disease. Apply an over-the-counter antibiotic ointment (e.g., bacitracin) to the bite once.  Expected Course:  Tick bites normally do not itch or hurt. That is why they often go unnoticed.  Call Back If:  Fever or rash occur in the next 2 weeks  Bite begins to look infected  You become worse.  Patient Will Follow Care Advice:  YES

## 2013-02-25 NOTE — Progress Notes (Signed)
  Subjective:    Patient ID: [name], male    DOB: 05-13-1931, 79 y.o.   MRN: [id number]  HPI CC: ?rash  Noticed last night right forearm with red rash around possible bite mark, and there was central clearing yesterday.  Today noticeably improved.  Yesterday bilateral knees started bothering him, knee pain has persisted.    Denies fevers/chills, abd pain, HA, neck stiffness, nausea/vomiting Treated with aloe vera ointment.  Did not see tick bite or any other bug bite.  On coumadin.  Review of Systems Per HPI    Objective:   Physical Exam  Nursing note and vitals reviewed. Constitutional: He appears well-developed and well-nourished. No distress.  Musculoskeletal:  FROM at knees bilaterally, no swelling or tenderness noted on exam  Skin: Skin is warm and dry. Rash noted.     Faint erythema of about 6cm diameter surrounding small scab on posterior right forearm, pruritic, not tender FROM at elbows.       Assessment & Plan:

## 2013-02-25 NOTE — Assessment & Plan Note (Addendum)
Rash today is not consistent with EM or lyme disease, however wife, who is a retired NP, was worried about erythema migrans last night given marked erythema with central clearing surrounding possible tick bite (although no ticks seen) Will place on 7d course of doxycycline. I asked them to come on Tuesday for coumadin check on doxy. Last INR 2.0 - no changes to coumadin dosing today. If persistent rash or new sxs concerning for lyme disease, will obtain titers.  Pt/wife agree with plan.

## 2013-02-25 NOTE — Telephone Encounter (Signed)
Appt scheduled

## 2013-02-25 NOTE — Patient Instructions (Signed)
Come in on Tuesday for recheck coumadin level on doxycycline.  Call for appointment. If persistent symptoms or new rash develops, we will check lyme disease titers. Take doxycycline for 7 day course starting now.

## 2013-02-25 NOTE — Telephone Encounter (Signed)
May place him at 4:45pm

## 2013-02-26 ENCOUNTER — Other Ambulatory Visit: Payer: Self-pay | Admitting: Family Medicine

## 2013-02-28 ENCOUNTER — Ambulatory Visit (INDEPENDENT_AMBULATORY_CARE_PROVIDER_SITE_OTHER): Payer: Medicare Other | Admitting: Family Medicine

## 2013-02-28 DIAGNOSIS — Z5181 Encounter for therapeutic drug level monitoring: Secondary | ICD-10-CM | POA: Diagnosis not present

## 2013-02-28 DIAGNOSIS — L219 Seborrheic dermatitis, unspecified: Secondary | ICD-10-CM | POA: Diagnosis not present

## 2013-02-28 DIAGNOSIS — I82409 Acute embolism and thrombosis of unspecified deep veins of unspecified lower extremity: Secondary | ICD-10-CM | POA: Diagnosis not present

## 2013-02-28 DIAGNOSIS — L821 Other seborrheic keratosis: Secondary | ICD-10-CM | POA: Diagnosis not present

## 2013-02-28 DIAGNOSIS — D485 Neoplasm of uncertain behavior of skin: Secondary | ICD-10-CM | POA: Diagnosis not present

## 2013-02-28 DIAGNOSIS — L820 Inflamed seborrheic keratosis: Secondary | ICD-10-CM | POA: Diagnosis not present

## 2013-02-28 DIAGNOSIS — Z7901 Long term (current) use of anticoagulants: Secondary | ICD-10-CM

## 2013-02-28 DIAGNOSIS — L570 Actinic keratosis: Secondary | ICD-10-CM | POA: Diagnosis not present

## 2013-02-28 DIAGNOSIS — L578 Other skin changes due to chronic exposure to nonionizing radiation: Secondary | ICD-10-CM | POA: Diagnosis not present

## 2013-03-08 DIAGNOSIS — IMO0002 Reserved for concepts with insufficient information to code with codable children: Secondary | ICD-10-CM

## 2013-03-08 DIAGNOSIS — M4810 Ankylosing hyperostosis [Forestier], site unspecified: Secondary | ICD-10-CM

## 2013-03-08 HISTORY — DX: Ankylosing hyperostosis (forestier), site unspecified: M48.10

## 2013-03-08 HISTORY — DX: Reserved for concepts with insufficient information to code with codable children: IMO0002

## 2013-03-09 DIAGNOSIS — M5430 Sciatica, unspecified side: Secondary | ICD-10-CM | POA: Diagnosis not present

## 2013-03-09 DIAGNOSIS — M999 Biomechanical lesion, unspecified: Secondary | ICD-10-CM | POA: Diagnosis not present

## 2013-03-09 DIAGNOSIS — M5137 Other intervertebral disc degeneration, lumbosacral region: Secondary | ICD-10-CM | POA: Diagnosis not present

## 2013-03-16 ENCOUNTER — Other Ambulatory Visit: Payer: Self-pay | Admitting: Family Medicine

## 2013-03-16 DIAGNOSIS — M5430 Sciatica, unspecified side: Secondary | ICD-10-CM | POA: Diagnosis not present

## 2013-03-16 DIAGNOSIS — M5137 Other intervertebral disc degeneration, lumbosacral region: Secondary | ICD-10-CM | POA: Diagnosis not present

## 2013-03-16 DIAGNOSIS — M999 Biomechanical lesion, unspecified: Secondary | ICD-10-CM | POA: Diagnosis not present

## 2013-03-16 DIAGNOSIS — H532 Diplopia: Secondary | ICD-10-CM | POA: Diagnosis not present

## 2013-03-16 MED ORDER — WARFARIN SODIUM 3 MG PO TABS: ORAL_TABLET | ORAL | Status: DC

## 2013-03-16 NOTE — Telephone Encounter (Signed)
Pt requesting 7 day supply until mail order arrives.

## 2013-03-17 DIAGNOSIS — M999 Biomechanical lesion, unspecified: Secondary | ICD-10-CM | POA: Diagnosis not present

## 2013-03-17 DIAGNOSIS — M5430 Sciatica, unspecified side: Secondary | ICD-10-CM | POA: Diagnosis not present

## 2013-03-17 DIAGNOSIS — M5137 Other intervertebral disc degeneration, lumbosacral region: Secondary | ICD-10-CM | POA: Diagnosis not present

## 2013-03-21 ENCOUNTER — Encounter (HOSPITAL_COMMUNITY): Payer: Self-pay | Admitting: *Deleted

## 2013-03-21 ENCOUNTER — Emergency Department (HOSPITAL_COMMUNITY): Payer: Medicare Other

## 2013-03-21 ENCOUNTER — Emergency Department (HOSPITAL_COMMUNITY)
Admission: EM | Admit: 2013-03-21 | Discharge: 2013-03-21 | Disposition: A | Discharge status: Home / Self Care | Payer: Medicare Other | Attending: Emergency Medicine | Admitting: Emergency Medicine

## 2013-03-21 ENCOUNTER — Ambulatory Visit (INDEPENDENT_AMBULATORY_CARE_PROVIDER_SITE_OTHER): Payer: Medicare Other | Admitting: Family Medicine

## 2013-03-21 DIAGNOSIS — S139XXA Sprain of joints and ligaments of unspecified parts of neck, initial encounter: Secondary | ICD-10-CM | POA: Diagnosis not present

## 2013-03-21 DIAGNOSIS — R296 Repeated falls: Secondary | ICD-10-CM | POA: Insufficient documentation

## 2013-03-21 DIAGNOSIS — R51 Headache: Secondary | ICD-10-CM | POA: Insufficient documentation

## 2013-03-21 DIAGNOSIS — N289 Disorder of kidney and ureter, unspecified: Secondary | ICD-10-CM | POA: Insufficient documentation

## 2013-03-21 DIAGNOSIS — Z880 Allergy status to penicillin: Secondary | ICD-10-CM | POA: Insufficient documentation

## 2013-03-21 DIAGNOSIS — M129 Arthropathy, unspecified: Secondary | ICD-10-CM | POA: Diagnosis not present

## 2013-03-21 DIAGNOSIS — Z7901 Long term (current) use of anticoagulants: Secondary | ICD-10-CM

## 2013-03-21 DIAGNOSIS — E785 Hyperlipidemia, unspecified: Secondary | ICD-10-CM | POA: Insufficient documentation

## 2013-03-21 DIAGNOSIS — Y998 Other external cause status: Secondary | ICD-10-CM | POA: Insufficient documentation

## 2013-03-21 DIAGNOSIS — H9190 Unspecified hearing loss, unspecified ear: Secondary | ICD-10-CM | POA: Diagnosis not present

## 2013-03-21 DIAGNOSIS — S0990XA Unspecified injury of head, initial encounter: Secondary | ICD-10-CM | POA: Diagnosis not present

## 2013-03-21 DIAGNOSIS — Z86711 Personal history of pulmonary embolism: Secondary | ICD-10-CM | POA: Insufficient documentation

## 2013-03-21 DIAGNOSIS — Z8781 Personal history of (healed) traumatic fracture: Secondary | ICD-10-CM | POA: Insufficient documentation

## 2013-03-21 DIAGNOSIS — T07XXXA Unspecified multiple injuries, initial encounter: Secondary | ICD-10-CM | POA: Diagnosis not present

## 2013-03-21 DIAGNOSIS — Z888 Allergy status to other drugs, medicaments and biological substances status: Secondary | ICD-10-CM | POA: Diagnosis not present

## 2013-03-21 DIAGNOSIS — M546 Pain in thoracic spine: Secondary | ICD-10-CM | POA: Diagnosis not present

## 2013-03-21 DIAGNOSIS — Q393 Congenital stenosis and stricture of esophagus: Secondary | ICD-10-CM | POA: Insufficient documentation

## 2013-03-21 DIAGNOSIS — Q391 Atresia of esophagus with tracheo-esophageal fistula: Secondary | ICD-10-CM | POA: Insufficient documentation

## 2013-03-21 DIAGNOSIS — S161XXA Strain of muscle, fascia and tendon at neck level, initial encounter: Secondary | ICD-10-CM

## 2013-03-21 DIAGNOSIS — Z5181 Encounter for therapeutic drug level monitoring: Secondary | ICD-10-CM

## 2013-03-21 DIAGNOSIS — D649 Anemia, unspecified: Secondary | ICD-10-CM | POA: Insufficient documentation

## 2013-03-21 DIAGNOSIS — J309 Allergic rhinitis, unspecified: Secondary | ICD-10-CM | POA: Insufficient documentation

## 2013-03-21 DIAGNOSIS — S0003XA Contusion of scalp, initial encounter: Secondary | ICD-10-CM | POA: Insufficient documentation

## 2013-03-21 DIAGNOSIS — F329 Major depressive disorder, single episode, unspecified: Secondary | ICD-10-CM | POA: Diagnosis not present

## 2013-03-21 DIAGNOSIS — I2510 Atherosclerotic heart disease of native coronary artery without angina pectoris: Secondary | ICD-10-CM | POA: Diagnosis not present

## 2013-03-21 DIAGNOSIS — Z882 Allergy status to sulfonamides status: Secondary | ICD-10-CM | POA: Diagnosis not present

## 2013-03-21 DIAGNOSIS — Z79899 Other long term (current) drug therapy: Secondary | ICD-10-CM | POA: Diagnosis not present

## 2013-03-21 DIAGNOSIS — IMO0002 Reserved for concepts with insufficient information to code with codable children: Secondary | ICD-10-CM | POA: Diagnosis not present

## 2013-03-21 DIAGNOSIS — I82409 Acute embolism and thrombosis of unspecified deep veins of unspecified lower extremity: Secondary | ICD-10-CM

## 2013-03-21 DIAGNOSIS — Z8672 Personal history of thrombophlebitis: Secondary | ICD-10-CM | POA: Insufficient documentation

## 2013-03-21 DIAGNOSIS — S0993XA Unspecified injury of face, initial encounter: Secondary | ICD-10-CM | POA: Diagnosis not present

## 2013-03-21 DIAGNOSIS — K219 Gastro-esophageal reflux disease without esophagitis: Secondary | ICD-10-CM | POA: Insufficient documentation

## 2013-03-21 DIAGNOSIS — Y9389 Activity, other specified: Secondary | ICD-10-CM | POA: Insufficient documentation

## 2013-03-21 DIAGNOSIS — S298XXA Other specified injuries of thorax, initial encounter: Secondary | ICD-10-CM | POA: Diagnosis not present

## 2013-03-21 DIAGNOSIS — Z5189 Encounter for other specified aftercare: Secondary | ICD-10-CM | POA: Insufficient documentation

## 2013-03-21 DIAGNOSIS — F3289 Other specified depressive episodes: Secondary | ICD-10-CM | POA: Insufficient documentation

## 2013-03-21 DIAGNOSIS — J45909 Unspecified asthma, uncomplicated: Secondary | ICD-10-CM | POA: Diagnosis not present

## 2013-03-21 DIAGNOSIS — T148XXA Other injury of unspecified body region, initial encounter: Secondary | ICD-10-CM

## 2013-03-21 DIAGNOSIS — Y9229 Other specified public building as the place of occurrence of the external cause: Secondary | ICD-10-CM | POA: Insufficient documentation

## 2013-03-21 LAB — POCT INR: INR: 2

## 2013-03-21 MED ORDER — ONDANSETRON 4 MG PO TBDP
8.0000 mg | ORAL_TABLET | Freq: Once | ORAL | Status: AC
  Administered 2013-03-21: 8 mg via ORAL
  Filled 2013-03-21: qty 2

## 2013-03-21 MED ORDER — HYDROCODONE-ACETAMINOPHEN 5-325 MG PO TABS
1.0000 | ORAL_TABLET | Freq: Once | ORAL | Status: AC
Start: 2013-03-21 — End: 2013-03-21
  Administered 2013-03-21: 1 via ORAL
  Filled 2013-03-21: qty 1

## 2013-03-21 MED ORDER — HYDROMORPHONE HCL PF 1 MG/ML IJ SOLN
0.5000 mg | Freq: Once | INTRAMUSCULAR | Status: AC
  Administered 2013-03-21: 0.5 mg via INTRAMUSCULAR
  Filled 2013-03-21: qty 1

## 2013-03-21 MED ORDER — HYDROCODONE-ACETAMINOPHEN 5-325 MG PO TABS: 1.0000 | ORAL_TABLET | ORAL | Status: DC | PRN

## 2013-03-21 NOTE — ED Notes (Signed)
Pt returned from radiology.

## 2013-03-21 NOTE — ED Notes (Signed)
Pt was getting stuff out of car and went to throw bag on shoulder and fell flat on his back. Pt his hit back of head and has abrasions.  Pt reports some neck pain.   Pt is hurting in his middle back and feels like he has broken a rib.  NO LOC.  Pt is on coumadin.  Pt placed in c-collar at triage

## 2013-03-21 NOTE — ED Provider Notes (Signed)
History    CSN: [id number] Arrival date & time 03/21/13  1804  First MD Initiated Contact with Patient 03/21/13 1841     Chief Complaint  Patient presents with  . Fall   (Consider location/radiation/quality/duration/timing/severity/associated sxs/prior Treatment) HPI Comments: Patient comes to the ER for evaluation after a fall. Patient was loading groceries into his car when the fall occurred. He tried to put a bag over his shoulder and minimally tilted backwards. He at the back of his head and back on the pavement. No loss of consciousness. Patient complaining of headache, neck pain and mid back pain. The back pain radiates around the ribs on both sides and the back. Patient not having trouble breathing. No abdominal pain, pelvic pain. Patient able to ambulate, no hip or leg injury.  Patient is a 79 y.o. male presenting with fall.  Fall Associated symptoms include headaches.   Past Medical History  Diagnosis Date  . GERD (gastroesophageal reflux disease)     h/o PUD  . CAD (coronary artery disease)     cath 2000 30% single vessel, normal nuclear stress test 12/03/2010, no evidence ischemia  . Renal insufficiency     baseline Cr 1.7  . Depression   . Hx pulmonary embolism 08/1991    coumadin  . History of phlebitis   . Allergic rhinitis   . Urge incontinence   . Elevated PSA     previous-normalized (followed by Dr. Annabell Howells, rec no repeat unless urinary sxs)  . Anemia   . HLD (hyperlipidemia)     hypertriglyceridemia  . Asthma     remote  . Arthritis   . Blood transfusion 1990's  . Schatzki's ring 02/2012    s/p dilation Christella Hartigan)  . History of fracture of right hip   . Hearing loss 06/2012    eval - rec annual exam  . Fall against object     against water cooler 06/29/12  . Essential tremor 09/27/2008  . Prediabetes    Past Surgical History  Procedure Laterality Date  . Tonsillectomy  1965  . Hernia repair  1979    Left  . Cystoscopy  1992    for kidney stones    . Exploratory laparotomy  1996    with incidental appendectomy  . Appendectomy  1996  . Cardiac catheterization  2000    30% one vessel  . Knee arthroscopy  03/24/02    Right (Dr. Gerrit Heck)  . Orif femoral neck fracture w/ dhs  08/03/02    Right (Dr. Gerrit Heck)  . Ct abd w & pelvis wo cm  07/2001    Scarring of right lung, stable negative o/w  . Ct abd w & pelvis wo cm  11/2000    ? stones, right LL scarring  . V/q scan  06/1999    negative  . [place]oscopy  1998    [place]  . US echocardiography  12/28/2007    Mild aortic valve calcification EF 55%, basically nml  . Carotid u/s  12/28/2007    nml  . Eeg  11/12/2009    nml  . Mri  11/2009    Head, nml  . Cataract extraction  Jan, March 2011    bilateral  . Colonoscopy  07/2010    5 polyps, adenomatous, rec rpt 3 yrs  . Esophagogastroduodenoscopy  02/2012    dilation of schatzki's ring   Family History  Problem Relation Age of Onset  . Stroke Mother   . Hypertension Mother   .  Cancer Brother     prostate with mets  . Blindness Brother     legally  . Diabetes Brother   . Pulmonary embolism Sister     from shoulder operation  . Alcohol abuse Brother   . Colon cancer Neg Hx   . Esophageal cancer Neg Hx   . Rectal cancer Neg Hx   . Stomach cancer Neg Hx    History  Substance Use Topics  . Smoking status: Never Smoker   . Smokeless tobacco: Never Used  . Alcohol Use: No    Review of Systems  HENT: Positive for neck pain.   Musculoskeletal: Positive for back pain.  Skin: Positive for wound.  Neurological: Positive for headaches.  All other systems reviewed and are negative.    Allergies  Nizatidine; Penicillins; and Sulfadiazine  Home Medications   Current Outpatient Rx  Name  Route  Sig  Dispense  Refill  . albuterol (PROVENTIL HFA;VENTOLIN HFA) 108 (90 BASE) MCG/ACT inhaler   Inhalation   Inhale 2 puffs into the lungs every 6 (six) hours as needed for wheezing or shortness of breath.   1 Inhaler   3    . atorvastatin (LIPITOR) 40 MG tablet      TAKE 1 TABLET AT BEDTIME   90 tablet   3   . cholecalciferol (VITAMIN D) 1000 UNITS tablet   Oral   Take 1,000 Units by mouth every other day.          . Coenzyme Q10 (COQ-10) 100 MG CAPS   Oral   Take 1 capsule by mouth at bedtime.          . Cyanocobalamin (B-12 PO)   Oral   Take 1 tablet by mouth every other day.         Marland Kitchen doxycycline (VIBRAMYCIN) 100 MG capsule   Oral   Take 1 capsule (100 mg total) by mouth 2 (two) times daily.   14 capsule   0   . fenofibrate (TRICOR) 145 MG tablet      TAKE 1 TABLET DAILY   90 tablet   3   . fexofenadine (ALLEGRA) 180 MG tablet   Oral   Take 180 mg by mouth daily as needed.          . gabapentin (NEURONTIN) 100 MG capsule   Oral   Take 1 capsule (100 mg total) by mouth 2 (two) times daily.   270 capsule   3   . Multiple Vitamins-Minerals (MULTIVITAMIN PO)   Oral   Take 1 tablet by mouth daily.         . mupirocin cream (BACTROBAN) 2 %   Topical   Apply topically 3 (three) times daily.   15 g   0   . oxybutynin (DITROPAN) 5 MG tablet      TAKE 1 TABLET TWICE A DAY AS NEEDED   180 tablet   3   . RABEprazole (ACIPHEX) 20 MG tablet   Oral   Take 1 tablet (20 mg total) by mouth daily.   90 tablet   3   . ranitidine (ZANTAC) 150 MG capsule   Oral   Take 150 mg by mouth every evening.         . valACYclovir (VALTREX) 1000 MG tablet      1000mg  twice daily for 1 day as needed   10 tablet   1   . venlafaxine (EFFEXOR) 75 MG tablet      TAKE 1 TABLET  DAILY   90 tablet   3   . warfarin (COUMADIN) 3 MG tablet      Take as directed by [facility].   7 tablet   0     7 day supply    BP 110/85  Pulse 71  Temp(Src) 98.2 F (36.8 C) (Oral)  Resp 22  SpO2 99% Physical Exam  Constitutional: He is oriented to person, place, and time. He appears well-developed and well-nourished. No distress.  HENT:  Head: Normocephalic. Head is with  abrasion. Head is without laceration.    Right Ear: Hearing normal.  Left Ear: Hearing normal.  Nose: Nose normal.  Mouth/Throat: Oropharynx is clear and moist and mucous membranes are normal.  Eyes: Conjunctivae and EOM are normal. Pupils are equal, round, and reactive to light.  Neck: Normal range of motion. Neck supple.  Cardiovascular: Regular rhythm, S1 normal and S2 normal.  Exam reveals no gallop and no friction rub.   No murmur heard. Pulmonary/Chest: Effort normal and breath sounds normal. No respiratory distress. He exhibits no tenderness.  Abdominal: Soft. Normal appearance and bowel sounds are normal. There is no hepatosplenomegaly. There is no tenderness. There is no rebound, no guarding, no tenderness at McBurney's point and negative Murphy's sign. No hernia.  Musculoskeletal: Normal range of motion.       Thoracic back: He exhibits tenderness.       Back:  Neurological: He is alert and oriented to person, place, and time. He has normal strength. No cranial nerve deficit or sensory deficit. Coordination normal. GCS eye subscore is 4. GCS verbal subscore is 5. GCS motor subscore is 6.  Skin: Skin is warm, dry and intact. No rash noted. No cyanosis.  Psychiatric: He has a normal mood and affect. His speech is normal and behavior is normal. Thought content normal.    ED Course  Procedures (including critical care time) Labs Reviewed - No data to display Dg Ribs Bilateral W/chest  03/21/2013   *RADIOLOGY REPORT*  Clinical Data: Pain post fall.  BILATERAL RIBS AND CHEST - 4+ VIEW  Comparison: 08/24/2011  Findings: Stable calcified right hilar lymph nodes suggesting old granulomatous disease.  There is blunting of the left lateral costophrenic angle.  No pneumothorax.  No displaced fracture or other acute bony abnormality.  IMPRESSION:  1.  Blunting of left lateral costophrenic angle suggesting small effusion or scarring. 2.  Negative for displaced rib fracture or associated  complication.   Original Report Authenticated By: D. Andria Rhein, MD   Dg Thoracic Spine 2 View  03/21/2013   *RADIOLOGY REPORT*  Clinical Data: Pain post fall.  THORACIC SPINE - 2 VIEW  Comparison: 10/24/2010  Findings: Prominent anterior osteophytes in the visualized lower cervical spine.  Flowing osteophytes across multiple contiguous levels in the mid and lower thoracic spine.  Negative for fracture. Normal alignment. Calcified right hilar lymph nodes are incidentally noted.  IMPRESSION: No acute thoracic spine abnormality.   Original Report Authenticated By: D. Andria Rhein, MD   Dg Lumbar Spine Complete  03/21/2013   *RADIOLOGY REPORT*  Clinical Data: Pain post fall.  LUMBAR SPINE - COMPLETE 4+ VIEW  Comparison: CT 07/11/2010  Findings: Prominent anterior osteophytes across all lumbar disc levels.  Normal alignment.  Negative for fracture.  No significant disc narrowing.  Fixation hardware in the right femoral head is partially seen.  Bilateral pelvic phleboliths.  IMPRESSION:  1.  Negative for fracture or other acute bone abnormality. 2.  Extensive endplate spurring without  significant disc narrowing suggesting diffuse idiopathic skeletal hyperostosis (DISH).   Original Report Authenticated By: D. Andria Rhein, MD   Ct Head Wo Contrast  03/21/2013   *RADIOLOGY REPORT*  Clinical Data:  History of trauma from a fall.  CT HEAD WITHOUT CONTRAST CT CERVICAL SPINE WITHOUT CONTRAST  Technique:  Multidetector CT imaging of the head and cervical spine was performed following the standard protocol without intravenous contrast.  Multiplanar CT image reconstructions of the cervical spine were also generated.  Comparison:  Head CT 10/24/2010.  CT HEAD  Findings: Extensive patchy and confluent areas of decreased attenuation throughout the deep and periventricular white matter of the cerebral hemispheres bilaterally, similar to the prior study, compatible with chronic microvascular ischemic disease.  Mild cerebral  and cerebellar atrophy with mild ex vacuo dilatation of the ventricular system.  Cavum septum pellucidum (normal anatomical variant) incidentally noted. No acute displaced skull fractures are identified.  No acute intracranial abnormality.  Specifically, no evidence of acute post-traumatic intracranial hemorrhage, no definite regions of acute/subacute cerebral ischemia, no focal mass, mass effect, hydrocephalus or abnormal intra or extra-axial fluid collections.  The visualized paranasal sinuses and mastoids are well pneumatized.  IMPRESSION: 1.  No evidence of significant acute traumatic injury to the head or brain. 2.  Mild cerebral and cerebellar atrophy with extensive chronic microvascular ischemic changes in the cerebral white matter redemonstrated, as above.  CT CERVICAL SPINE  Findings: There are no definite acute displaced fractures of the cervical spine. Exaggeration of normal cervical lordosis which is presumably chronic and related to advanced degenerative disc disease.  Alignment is otherwise anatomic.  Prevertebral soft tissues are within normal limits.  Extensive multilevel degenerative disc disease is noted with extensive anterior osteophytes extending from C2-C7.  Mild multilevel facet arthropathy. The visualized portions of the upper thorax are unremarkable.  IMPRESSION: 1.  No evidence of significant acute traumatic injury to the cervical spine. 2.  Multilevel degenerative disc disease and cervical spondylosis, as above.   Original Report Authenticated By: Trudie Reed, M.D.   Ct Cervical Spine Wo Contrast  03/21/2013   *RADIOLOGY REPORT*  Clinical Data:  History of trauma from a fall.  CT HEAD WITHOUT CONTRAST CT CERVICAL SPINE WITHOUT CONTRAST  Technique:  Multidetector CT imaging of the head and cervical spine was performed following the standard protocol without intravenous contrast.  Multiplanar CT image reconstructions of the cervical spine were also generated.  Comparison:  Head CT  10/24/2010.  CT HEAD  Findings: Extensive patchy and confluent areas of decreased attenuation throughout the deep and periventricular white matter of the cerebral hemispheres bilaterally, similar to the prior study, compatible with chronic microvascular ischemic disease.  Mild cerebral and cerebellar atrophy with mild ex vacuo dilatation of the ventricular system.  Cavum septum pellucidum (normal anatomical variant) incidentally noted. No acute displaced skull fractures are identified.  No acute intracranial abnormality.  Specifically, no evidence of acute post-traumatic intracranial hemorrhage, no definite regions of acute/subacute cerebral ischemia, no focal mass, mass effect, hydrocephalus or abnormal intra or extra-axial fluid collections.  The visualized paranasal sinuses and mastoids are well pneumatized.  IMPRESSION: 1.  No evidence of significant acute traumatic injury to the head or brain. 2.  Mild cerebral and cerebellar atrophy with extensive chronic microvascular ischemic changes in the cerebral white matter redemonstrated, as above.  CT CERVICAL SPINE  Findings: There are no definite acute displaced fractures of the cervical spine. Exaggeration of normal cervical lordosis which is presumably chronic and related to advanced  degenerative disc disease.  Alignment is otherwise anatomic.  Prevertebral soft tissues are within normal limits.  Extensive multilevel degenerative disc disease is noted with extensive anterior osteophytes extending from C2-C7.  Mild multilevel facet arthropathy. The visualized portions of the upper thorax are unremarkable.  IMPRESSION: 1.  No evidence of significant acute traumatic injury to the cervical spine. 2.  Multilevel degenerative disc disease and cervical spondylosis, as above.   Original Report Authenticated By: Trudie Reed, M.D.   Diagnosis: Multiple contusions, status post fall  MDM  Patient presents with diffuse pain in the back, neck and head after a  ground-level fall. Patient fell backwards, landing on his backside. Examination was nonfocal. Neurologic exam is unremarkable. CT head, CT cervical spine, bilateral ribs with chest, thoracic spine, lumbar spine all negative. Patient treated with analgesia, will follow up with primary doctor.  Gilda Crease, MD 03/21/13 2045

## 2013-03-22 DIAGNOSIS — M5137 Other intervertebral disc degeneration, lumbosacral region: Secondary | ICD-10-CM | POA: Diagnosis not present

## 2013-03-22 DIAGNOSIS — M999 Biomechanical lesion, unspecified: Secondary | ICD-10-CM | POA: Diagnosis not present

## 2013-03-22 DIAGNOSIS — M5430 Sciatica, unspecified side: Secondary | ICD-10-CM | POA: Diagnosis not present

## 2013-03-24 DIAGNOSIS — M5137 Other intervertebral disc degeneration, lumbosacral region: Secondary | ICD-10-CM | POA: Diagnosis not present

## 2013-03-24 DIAGNOSIS — M999 Biomechanical lesion, unspecified: Secondary | ICD-10-CM | POA: Diagnosis not present

## 2013-03-24 DIAGNOSIS — M5430 Sciatica, unspecified side: Secondary | ICD-10-CM | POA: Diagnosis not present

## 2013-03-25 DIAGNOSIS — M999 Biomechanical lesion, unspecified: Secondary | ICD-10-CM | POA: Diagnosis not present

## 2013-03-25 DIAGNOSIS — M5137 Other intervertebral disc degeneration, lumbosacral region: Secondary | ICD-10-CM | POA: Diagnosis not present

## 2013-03-25 DIAGNOSIS — M5430 Sciatica, unspecified side: Secondary | ICD-10-CM | POA: Diagnosis not present

## 2013-03-27 ENCOUNTER — Encounter: Payer: Self-pay | Admitting: Family Medicine

## 2013-03-27 ENCOUNTER — Telehealth: Payer: Self-pay | Admitting: Family Medicine

## 2013-03-27 NOTE — Telephone Encounter (Signed)
I see pt was seen at ER after fall in driveway. Can we call pt for an update and see how he is recovering? Ensure no concerns for bleed (on coumadin)

## 2013-03-28 ENCOUNTER — Ambulatory Visit: Payer: Medicare Other

## 2013-03-28 NOTE — Telephone Encounter (Signed)
Message left for patient to return my call.  

## 2013-03-29 ENCOUNTER — Encounter: Payer: Self-pay | Admitting: Family Medicine

## 2013-03-29 ENCOUNTER — Ambulatory Visit (INDEPENDENT_AMBULATORY_CARE_PROVIDER_SITE_OTHER): Payer: Medicare Other | Admitting: Family Medicine

## 2013-03-29 VITALS — BP 116/62 | HR 80 | Temp 98.1°F | Wt 168.5 lb

## 2013-03-29 DIAGNOSIS — R0781 Pleurodynia: Secondary | ICD-10-CM

## 2013-03-29 DIAGNOSIS — R079 Chest pain, unspecified: Secondary | ICD-10-CM

## 2013-03-29 NOTE — Telephone Encounter (Signed)
Spoke with patient. He is rather sore from the fall and would like for you to check him out. Appt scheduled for today.

## 2013-03-29 NOTE — Assessment & Plan Note (Signed)
Exam reassuring, lungs clear, seems to be improving slowly. Anticipate posterior ribcage fracture or bony contusion. Treat with scheduled tylenol, and hydrocodone for breakthrough pain. Continue ice/heat to back.

## 2013-03-29 NOTE — Progress Notes (Signed)
  Subjective:    Patient ID: [name], male    DOB: [birth year], 79 y.o.   MRN: [id number]  HPI CC: ER f/u after fall  DOI: 03/21/2013 Pt lost balance when loading car with groceries, fell backwards and hit head and back.  No LOC.  After evaluation, discharged on hydrocodone 5/325 #20 to use 1/2 pill PRN.  Not helping pain.  Taking tylenol as well.   Continues in pain - main pain is at right back below scapula. No neck pain, HA, abd pain.  Normal bowels, normal voiding.  No hematuria.  ER records reviewed: Including stable lumbar, thoracic spine films, cxr, head CT and cervical neck CT - all negative for acute fractures.   Past Medical History  Diagnosis Date  . GERD (gastroesophageal reflux disease)     h/o PUD  . CAD (coronary artery disease)     cath 2000 30% single vessel, normal nuclear stress test 12/03/2010, no evidence ischemia  . Renal insufficiency     baseline Cr 1.7  . Depression   . Hx pulmonary embolism 08/1991    coumadin  . History of phlebitis   . Allergic rhinitis   . Urge incontinence   . Elevated PSA     previous-normalized (followed by Dr. Annabell Howells, rec no repeat unless urinary sxs)  . Anemia   . HLD (hyperlipidemia)     hypertriglyceridemia  . Asthma     remote  . Arthritis   . Blood transfusion 1990's  . Schatzki's ring 02/2012    s/p dilation Christella Hartigan)  . History of fracture of right hip   . Hearing loss 06/2012    eval - rec annual exam  . Fall against object 06/2012    against water cooler  . Essential tremor 09/27/2008  . Prediabetes   . DISH (diffuse idiopathic skeletal hyperostosis) 03/2013    lumbar spine on xray  . DDD (degenerative disc disease) 03/2013    by CT, diffuse multilevel cervical and lumbar spondylosis     Review of Systems Per HPI    Objective:   Physical Exam  Nursing note and vitals reviewed. Constitutional: He appears well-developed and well-nourished. No distress.  HENT:  Head: Normocephalic and atraumatic.   Mouth/Throat: Oropharynx is clear and moist. No oropharyngeal exudate.  Cardiovascular: Normal rate, regular rhythm, normal heart sounds and intact distal pulses.   No murmur heard. Pulmonary/Chest: Effort normal and breath sounds normal. No respiratory distress. He has no wheezes. He has no rales. He exhibits tenderness.  Tender to palpation posterior right ribcage inferior to scapula.  Abdominal: Soft. Normal appearance and bowel sounds are normal. He exhibits no distension and no mass. There is no tenderness. There is no rebound and no guarding.  No flank pain  Musculoskeletal: He exhibits no edema.  No midline spine or paraspinous mm tenderness       Assessment & Plan:

## 2013-03-29 NOTE — Patient Instructions (Signed)
If not rib fracture, then bad bony bruise - treatment will be similar for both.  Time and pain control. Continue tylenol 500-1000mg  twice daily for pain control, using hydrocodone (1/2 to 1 tablet) for breakthrough pain. Let us know if not improving each week.

## 2013-04-21 ENCOUNTER — Ambulatory Visit (INDEPENDENT_AMBULATORY_CARE_PROVIDER_SITE_OTHER): Payer: Medicare Other | Admitting: Family Medicine

## 2013-04-21 DIAGNOSIS — Z5181 Encounter for therapeutic drug level monitoring: Secondary | ICD-10-CM | POA: Diagnosis not present

## 2013-04-21 DIAGNOSIS — I82409 Acute embolism and thrombosis of unspecified deep veins of unspecified lower extremity: Secondary | ICD-10-CM

## 2013-04-21 DIAGNOSIS — Z7901 Long term (current) use of anticoagulants: Secondary | ICD-10-CM | POA: Diagnosis not present

## 2013-04-27 ENCOUNTER — Encounter: Payer: Self-pay | Admitting: Gastroenterology

## 2013-05-19 ENCOUNTER — Ambulatory Visit (INDEPENDENT_AMBULATORY_CARE_PROVIDER_SITE_OTHER): Payer: Medicare Other | Admitting: Family Medicine

## 2013-05-19 DIAGNOSIS — Z5181 Encounter for therapeutic drug level monitoring: Secondary | ICD-10-CM

## 2013-05-19 DIAGNOSIS — Z7901 Long term (current) use of anticoagulants: Secondary | ICD-10-CM | POA: Diagnosis not present

## 2013-05-19 DIAGNOSIS — I82409 Acute embolism and thrombosis of unspecified deep veins of unspecified lower extremity: Secondary | ICD-10-CM | POA: Diagnosis not present

## 2013-05-19 LAB — POCT INR: INR: 3

## 2013-05-25 ENCOUNTER — Other Ambulatory Visit: Payer: Self-pay | Admitting: Family Medicine

## 2013-05-25 DIAGNOSIS — D6949 Other primary thrombocytopenia: Secondary | ICD-10-CM

## 2013-05-25 DIAGNOSIS — E785 Hyperlipidemia, unspecified: Secondary | ICD-10-CM

## 2013-05-25 DIAGNOSIS — R7303 Prediabetes: Secondary | ICD-10-CM

## 2013-05-25 DIAGNOSIS — E538 Deficiency of other specified B group vitamins: Secondary | ICD-10-CM

## 2013-05-30 DIAGNOSIS — D692 Other nonthrombocytopenic purpura: Secondary | ICD-10-CM | POA: Diagnosis not present

## 2013-05-30 DIAGNOSIS — L570 Actinic keratosis: Secondary | ICD-10-CM | POA: Diagnosis not present

## 2013-05-31 DIAGNOSIS — M999 Biomechanical lesion, unspecified: Secondary | ICD-10-CM | POA: Diagnosis not present

## 2013-05-31 DIAGNOSIS — M5137 Other intervertebral disc degeneration, lumbosacral region: Secondary | ICD-10-CM | POA: Diagnosis not present

## 2013-05-31 DIAGNOSIS — M5430 Sciatica, unspecified side: Secondary | ICD-10-CM | POA: Diagnosis not present

## 2013-06-02 ENCOUNTER — Other Ambulatory Visit (INDEPENDENT_AMBULATORY_CARE_PROVIDER_SITE_OTHER): Payer: Medicare Other

## 2013-06-02 DIAGNOSIS — D6949 Other primary thrombocytopenia: Secondary | ICD-10-CM | POA: Diagnosis not present

## 2013-06-02 DIAGNOSIS — R7309 Other abnormal glucose: Secondary | ICD-10-CM | POA: Diagnosis not present

## 2013-06-02 DIAGNOSIS — M5137 Other intervertebral disc degeneration, lumbosacral region: Secondary | ICD-10-CM | POA: Diagnosis not present

## 2013-06-02 DIAGNOSIS — M5430 Sciatica, unspecified side: Secondary | ICD-10-CM | POA: Diagnosis not present

## 2013-06-02 DIAGNOSIS — E785 Hyperlipidemia, unspecified: Secondary | ICD-10-CM | POA: Diagnosis not present

## 2013-06-02 DIAGNOSIS — R7303 Prediabetes: Secondary | ICD-10-CM

## 2013-06-02 DIAGNOSIS — E538 Deficiency of other specified B group vitamins: Secondary | ICD-10-CM

## 2013-06-02 DIAGNOSIS — M999 Biomechanical lesion, unspecified: Secondary | ICD-10-CM | POA: Diagnosis not present

## 2013-06-02 DIAGNOSIS — Z0000 Encounter for general adult medical examination without abnormal findings: Secondary | ICD-10-CM

## 2013-06-02 LAB — COMPREHENSIVE METABOLIC PANEL
AST: 27 U/L (ref 0–37)
Alkaline Phosphatase: 39 U/L (ref 39–117)
BUN: 27 mg/dL — ABNORMAL HIGH (ref 6–23)
Creatinine, Ser: 1.8 mg/dL — ABNORMAL HIGH (ref 0.4–1.5)
Total Bilirubin: 1.1 mg/dL (ref 0.3–1.2)

## 2013-06-02 LAB — CBC WITH DIFFERENTIAL/PLATELET
Eosinophils Relative: 1.6 % (ref 0.0–5.0)
HCT: 38.6 % — ABNORMAL LOW (ref 39.0–52.0)
Lymphs Abs: 1.4 10*3/uL (ref 0.7–4.0)
Monocytes Relative: 10 % (ref 3.0–12.0)
Neutrophils Relative %: 62 % (ref 43.0–77.0)
Platelets: 135 10*3/uL — ABNORMAL LOW (ref 150.0–400.0)
WBC: 5.3 10*3/uL (ref 4.5–10.5)

## 2013-06-02 LAB — VITAMIN B12: Vitamin B-12: >1500 pg/mL — ABNORMAL HIGH (ref 211–911)

## 2013-06-02 LAB — LIPID PANEL
Cholesterol: 134 mg/dL (ref 0–200)
HDL: 25.3 mg/dL — ABNORMAL LOW (ref >39.00)
LDL Cholesterol: 72 mg/dL (ref 0–99)
Triglycerides: 186 mg/dL — ABNORMAL HIGH (ref 0.0–149.0)
VLDL: 37.2 mg/dL (ref 0.0–40.0)

## 2013-06-03 LAB — PATHOLOGIST SMEAR REVIEW

## 2013-06-07 DIAGNOSIS — M999 Biomechanical lesion, unspecified: Secondary | ICD-10-CM | POA: Diagnosis not present

## 2013-06-07 DIAGNOSIS — M5137 Other intervertebral disc degeneration, lumbosacral region: Secondary | ICD-10-CM | POA: Diagnosis not present

## 2013-06-07 DIAGNOSIS — M5430 Sciatica, unspecified side: Secondary | ICD-10-CM | POA: Diagnosis not present

## 2013-06-08 ENCOUNTER — Ambulatory Visit (INDEPENDENT_AMBULATORY_CARE_PROVIDER_SITE_OTHER): Payer: Medicare Other | Admitting: Family Medicine

## 2013-06-08 ENCOUNTER — Encounter: Payer: Self-pay | Admitting: Family Medicine

## 2013-06-08 VITALS — BP 110/50 | HR 100 | Temp 98.3°F | Ht 65.25 in | Wt 168.5 lb

## 2013-06-08 DIAGNOSIS — E538 Deficiency of other specified B group vitamins: Secondary | ICD-10-CM | POA: Diagnosis not present

## 2013-06-08 DIAGNOSIS — F0390 Unspecified dementia without behavioral disturbance: Secondary | ICD-10-CM | POA: Insufficient documentation

## 2013-06-08 DIAGNOSIS — G250 Essential tremor: Secondary | ICD-10-CM

## 2013-06-08 DIAGNOSIS — Z0000 Encounter for general adult medical examination without abnormal findings: Secondary | ICD-10-CM

## 2013-06-08 DIAGNOSIS — R413 Other amnesia: Secondary | ICD-10-CM | POA: Diagnosis not present

## 2013-06-08 DIAGNOSIS — D6949 Other primary thrombocytopenia: Secondary | ICD-10-CM | POA: Diagnosis not present

## 2013-06-08 DIAGNOSIS — N3941 Urge incontinence: Secondary | ICD-10-CM

## 2013-06-08 DIAGNOSIS — M5430 Sciatica, unspecified side: Secondary | ICD-10-CM | POA: Diagnosis not present

## 2013-06-08 DIAGNOSIS — M5137 Other intervertebral disc degeneration, lumbosacral region: Secondary | ICD-10-CM | POA: Diagnosis not present

## 2013-06-08 DIAGNOSIS — M999 Biomechanical lesion, unspecified: Secondary | ICD-10-CM | POA: Diagnosis not present

## 2013-06-08 NOTE — Assessment & Plan Note (Signed)
Doing well on gabapentin - continue. 

## 2013-06-08 NOTE — Assessment & Plan Note (Signed)
Reviewed dx - anticipate ITP.

## 2013-06-08 NOTE — Assessment & Plan Note (Signed)
On low dose oxybutynin.  Discussed option of myrbetriq.  Pt desires to continue current dose.

## 2013-06-08 NOTE — Assessment & Plan Note (Signed)
rec use b12 qod. persistent macrocytosis.

## 2013-06-08 NOTE — Assessment & Plan Note (Signed)
Again reviewed dx. Chronic, stable.
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
## 2013-06-08 NOTE — Patient Instructions (Addendum)
Flu shot today. Good to see you today, call us with questions. No changes to medicines today. Return in 6 months for follow up

## 2013-06-08 NOTE — Assessment & Plan Note (Signed)
Some difficulty with concentration on testing today, as well as endorsed mild delay in response by wife. In setting of chronic imbalance and chronic tremor. Did have MRI done 2011 - no records available.  Did see Dr. Anne Hahn in past for tremors (2011). I recommended continued memory games and to monitor memory over next 6 mo.  rtc 6 mo for f/u, will do MMSE at that time.  Consider aricept? ?beginnings of senile dementia vs vascular dementia vs other.

## 2013-06-08 NOTE — Assessment & Plan Note (Signed)
I have personally reviewed the Medicare Annual Wellness questionnaire and have noted 1. The patient's medical and social history 2. Their use of alcohol, tobacco or illicit drugs 3. Their current medications and supplements 4. The patient's functional ability including ADL's, fall risks, home safety risks and hearing or visual impairment. 5. Diet and physical activity 6. Evidence for depression or mood disorders The patients weight, height, BMI have been recorded in the chart.  Hearing and vision has been addressed. I have made referrals, counseling and provided education to the patient based review of the above and I have provided the pt with a written personalized care plan for preventive services. See scanned questionairre. Advanced directives discussed: advanced directive in chart under media (dated 06/19/2012)  Reviewed preventative protocols and updated unless pt declined. Age out of PSA testing unless develops sxs. Flu shot today.

## 2013-06-10 DIAGNOSIS — M5430 Sciatica, unspecified side: Secondary | ICD-10-CM | POA: Diagnosis not present

## 2013-06-10 DIAGNOSIS — M5137 Other intervertebral disc degeneration, lumbosacral region: Secondary | ICD-10-CM | POA: Diagnosis not present

## 2013-06-10 DIAGNOSIS — M999 Biomechanical lesion, unspecified: Secondary | ICD-10-CM | POA: Diagnosis not present

## 2013-06-13 DIAGNOSIS — M999 Biomechanical lesion, unspecified: Secondary | ICD-10-CM | POA: Diagnosis not present

## 2013-06-13 DIAGNOSIS — M5137 Other intervertebral disc degeneration, lumbosacral region: Secondary | ICD-10-CM | POA: Diagnosis not present

## 2013-06-13 DIAGNOSIS — M5430 Sciatica, unspecified side: Secondary | ICD-10-CM | POA: Diagnosis not present

## 2013-06-14 DIAGNOSIS — H532 Diplopia: Secondary | ICD-10-CM | POA: Diagnosis not present

## 2013-06-16 ENCOUNTER — Ambulatory Visit (INDEPENDENT_AMBULATORY_CARE_PROVIDER_SITE_OTHER): Payer: Medicare Other | Admitting: Family Medicine

## 2013-06-16 DIAGNOSIS — Z5181 Encounter for therapeutic drug level monitoring: Secondary | ICD-10-CM | POA: Diagnosis not present

## 2013-06-16 DIAGNOSIS — M5137 Other intervertebral disc degeneration, lumbosacral region: Secondary | ICD-10-CM | POA: Diagnosis not present

## 2013-06-16 DIAGNOSIS — M999 Biomechanical lesion, unspecified: Secondary | ICD-10-CM | POA: Diagnosis not present

## 2013-06-16 DIAGNOSIS — M5430 Sciatica, unspecified side: Secondary | ICD-10-CM | POA: Diagnosis not present

## 2013-06-16 DIAGNOSIS — I82409 Acute embolism and thrombosis of unspecified deep veins of unspecified lower extremity: Secondary | ICD-10-CM

## 2013-06-16 DIAGNOSIS — Z7901 Long term (current) use of anticoagulants: Secondary | ICD-10-CM | POA: Diagnosis not present

## 2013-06-20 DIAGNOSIS — M5137 Other intervertebral disc degeneration, lumbosacral region: Secondary | ICD-10-CM | POA: Diagnosis not present

## 2013-06-20 DIAGNOSIS — M999 Biomechanical lesion, unspecified: Secondary | ICD-10-CM | POA: Diagnosis not present

## 2013-06-20 DIAGNOSIS — M5430 Sciatica, unspecified side: Secondary | ICD-10-CM | POA: Diagnosis not present

## 2013-06-23 DIAGNOSIS — M5137 Other intervertebral disc degeneration, lumbosacral region: Secondary | ICD-10-CM | POA: Diagnosis not present

## 2013-06-23 DIAGNOSIS — M5430 Sciatica, unspecified side: Secondary | ICD-10-CM | POA: Diagnosis not present

## 2013-06-23 DIAGNOSIS — M999 Biomechanical lesion, unspecified: Secondary | ICD-10-CM | POA: Diagnosis not present

## 2013-06-30 DIAGNOSIS — M999 Biomechanical lesion, unspecified: Secondary | ICD-10-CM | POA: Diagnosis not present

## 2013-06-30 DIAGNOSIS — M5430 Sciatica, unspecified side: Secondary | ICD-10-CM | POA: Diagnosis not present

## 2013-06-30 DIAGNOSIS — M5137 Other intervertebral disc degeneration, lumbosacral region: Secondary | ICD-10-CM | POA: Diagnosis not present

## 2013-07-01 DIAGNOSIS — M5430 Sciatica, unspecified side: Secondary | ICD-10-CM | POA: Diagnosis not present

## 2013-07-01 DIAGNOSIS — M5137 Other intervertebral disc degeneration, lumbosacral region: Secondary | ICD-10-CM | POA: Diagnosis not present

## 2013-07-01 DIAGNOSIS — M999 Biomechanical lesion, unspecified: Secondary | ICD-10-CM | POA: Diagnosis not present

## 2013-07-04 DIAGNOSIS — M5137 Other intervertebral disc degeneration, lumbosacral region: Secondary | ICD-10-CM | POA: Diagnosis not present

## 2013-07-04 DIAGNOSIS — M5430 Sciatica, unspecified side: Secondary | ICD-10-CM | POA: Diagnosis not present

## 2013-07-04 DIAGNOSIS — M999 Biomechanical lesion, unspecified: Secondary | ICD-10-CM | POA: Diagnosis not present

## 2013-07-11 DIAGNOSIS — M999 Biomechanical lesion, unspecified: Secondary | ICD-10-CM | POA: Diagnosis not present

## 2013-07-11 DIAGNOSIS — M5137 Other intervertebral disc degeneration, lumbosacral region: Secondary | ICD-10-CM | POA: Diagnosis not present

## 2013-07-11 DIAGNOSIS — M5430 Sciatica, unspecified side: Secondary | ICD-10-CM | POA: Diagnosis not present

## 2013-07-14 ENCOUNTER — Other Ambulatory Visit: Payer: Self-pay

## 2013-07-14 ENCOUNTER — Ambulatory Visit (INDEPENDENT_AMBULATORY_CARE_PROVIDER_SITE_OTHER): Payer: Medicare Other | Admitting: Family Medicine

## 2013-07-14 DIAGNOSIS — I82409 Acute embolism and thrombosis of unspecified deep veins of unspecified lower extremity: Secondary | ICD-10-CM

## 2013-07-14 DIAGNOSIS — Z5181 Encounter for therapeutic drug level monitoring: Secondary | ICD-10-CM | POA: Diagnosis not present

## 2013-07-14 DIAGNOSIS — Z7901 Long term (current) use of anticoagulants: Secondary | ICD-10-CM

## 2013-07-14 LAB — POCT INR: INR: 2.1

## 2013-07-18 DIAGNOSIS — M999 Biomechanical lesion, unspecified: Secondary | ICD-10-CM | POA: Diagnosis not present

## 2013-07-18 DIAGNOSIS — M5137 Other intervertebral disc degeneration, lumbosacral region: Secondary | ICD-10-CM | POA: Diagnosis not present

## 2013-07-18 DIAGNOSIS — M5430 Sciatica, unspecified side: Secondary | ICD-10-CM | POA: Diagnosis not present

## 2013-07-20 DIAGNOSIS — M999 Biomechanical lesion, unspecified: Secondary | ICD-10-CM | POA: Diagnosis not present

## 2013-07-20 DIAGNOSIS — M5137 Other intervertebral disc degeneration, lumbosacral region: Secondary | ICD-10-CM | POA: Diagnosis not present

## 2013-07-20 DIAGNOSIS — M5430 Sciatica, unspecified side: Secondary | ICD-10-CM | POA: Diagnosis not present

## 2013-07-21 ENCOUNTER — Other Ambulatory Visit: Payer: Self-pay | Admitting: Family Medicine

## 2013-07-21 ENCOUNTER — Telehealth: Payer: Self-pay | Admitting: Family Medicine

## 2013-07-21 NOTE — Telephone Encounter (Signed)
Pt's wife dropped off an exercise program participation form to be completed by Dr. Sharen Hones

## 2013-07-22 NOTE — Telephone Encounter (Signed)
Form in your IN box for completion. 

## 2013-07-22 NOTE — Telephone Encounter (Signed)
Filled and placed in Kim's box. 

## 2013-07-25 ENCOUNTER — Ambulatory Visit (INDEPENDENT_AMBULATORY_CARE_PROVIDER_SITE_OTHER): Payer: Medicare Other | Admitting: Internal Medicine

## 2013-07-25 ENCOUNTER — Encounter: Payer: Self-pay | Admitting: Internal Medicine

## 2013-07-25 VITALS — BP 100/60 | HR 74 | Temp 98.4°F | Ht 65.25 in | Wt 174.2 lb

## 2013-07-25 DIAGNOSIS — M25469 Effusion, unspecified knee: Secondary | ICD-10-CM

## 2013-07-25 DIAGNOSIS — M5137 Other intervertebral disc degeneration, lumbosacral region: Secondary | ICD-10-CM | POA: Diagnosis not present

## 2013-07-25 DIAGNOSIS — M25562 Pain in left knee: Secondary | ICD-10-CM

## 2013-07-25 DIAGNOSIS — M25569 Pain in unspecified knee: Secondary | ICD-10-CM

## 2013-07-25 DIAGNOSIS — M25462 Effusion, left knee: Secondary | ICD-10-CM

## 2013-07-25 DIAGNOSIS — M999 Biomechanical lesion, unspecified: Secondary | ICD-10-CM | POA: Diagnosis not present

## 2013-07-25 DIAGNOSIS — M5430 Sciatica, unspecified side: Secondary | ICD-10-CM | POA: Diagnosis not present

## 2013-07-25 MED ORDER — PREDNISONE 10 MG PO TABS: ORAL_TABLET | ORAL | Status: DC

## 2013-07-25 NOTE — Progress Notes (Signed)
Pre-visit discussion using our clinic review tool. No additional management support is needed unless otherwise documented below in the visit note.  

## 2013-07-25 NOTE — Telephone Encounter (Signed)
Patient notified and form placed up front for pick up. 

## 2013-07-25 NOTE — Patient Instructions (Signed)
Knee Pain Knee pain can be a result of an injury or other medical conditions. Treatment will depend on the cause of your pain. HOME CARE  Only take medicine as told by your doctor.  Keep a healthy weight. Being overweight can make the knee hurt more.  Stretch before exercising or playing sports.  If there is constant knee pain, change the way you exercise. Ask your doctor for advice.  Make sure shoes fit well. Choose the right shoe for the sport or activity.  Protect your knees. Wear kneepads if needed.  Rest when you are tired. GET HELP RIGHT AWAY IF:   Your knee pain does not stop.  Your knee pain does not get better.  Your knee joint feels hot to the touch.  You have a fever. MAKE SURE YOU:   Understand these instructions.  Will watch this condition.  Will get help right away if you are not doing well or get worse. Document Released: 11/21/2008 Document Revised: 11/17/2011 Document Reviewed: 11/21/2008 [employer] Patient Information 2014 [employer].  

## 2013-07-25 NOTE — Progress Notes (Signed)
Subjective:    Patient ID: [name], male    DOB: 02-04-1931, 79 y.o.   MRN: [id number]  HPI  Pt presents to the clinic today with c/o left knee pain and swelling. This started yesterday. He denies any specific injury. He did not twist his knee or fall. He has put ice on it and kept it elevated. The swelling has gone down but the pain has not. He does report that it feels like his knee is going to give out on him. He denies numbness or tingling in his left leg or weakness. He does have arthritis in his right knee but he reports this does not feel the same.  Review of Systems      Past Medical History  Diagnosis Date  . GERD (gastroesophageal reflux disease)     h/o PUD  . CAD (coronary artery disease)     cath 2000 30% single vessel, normal nuclear stress test 12/03/2010, no evidence ischemia  . Renal insufficiency     baseline Cr 1.7  . Depression   . Hx pulmonary embolism 08/1991    coumadin  . History of phlebitis   . Allergic rhinitis   . Urge incontinence   . Elevated PSA     previous-normalized (followed by Dr. Annabell Howells, rec no repeat unless urinary sxs)  . Anemia   . HLD (hyperlipidemia)     hypertriglyceridemia  . Asthma     remote  . Arthritis   . Blood transfusion 1990's  . Schatzki's ring 02/2012    s/p dilation Christella Hartigan)  . History of fracture of right hip   . Hearing loss 06/2012    eval - rec annual exam  . Fall against object 06/2012    against water cooler  . Essential tremor 09/27/2008  . Prediabetes   . DISH (diffuse idiopathic skeletal hyperostosis) 03/2013    lumbar spine on xray  . DDD (degenerative disc disease) 03/2013    by CT, diffuse multilevel cervical and lumbar spondylosis    Current Outpatient Prescriptions  Medication Sig Dispense Refill  . albuterol (PROVENTIL HFA;VENTOLIN HFA) 108 (90 BASE) MCG/ACT inhaler Inhale 2 puffs into the lungs every 6 (six) hours as needed for wheezing or shortness of breath.  1 Inhaler  3  . atorvastatin  (LIPITOR) 40 MG tablet TAKE 1 TABLET AT BEDTIME  90 tablet  3  . cholecalciferol (VITAMIN D) 1000 UNITS tablet Take 1,000 Units by mouth every evening.       . Coenzyme Q10 (COQ-10) 100 MG CAPS Take 1 capsule by mouth at bedtime.       . Cyanocobalamin (B-12 PO) Take 1 tablet by mouth every other day.      . fenofibrate (TRICOR) 145 MG tablet TAKE 1 TABLET DAILY  90 tablet  3  . fexofenadine (ALLEGRA) 180 MG tablet Take 180 mg by mouth every evening.       . gabapentin (NEURONTIN) 100 MG capsule Take 100 mg by mouth 2 (two) times daily.      . Multiple Vitamins-Minerals (MULTIVITAMIN PO) Take 1 tablet by mouth every morning.       Marland Kitchen oxybutynin (DITROPAN) 5 MG tablet Take 5 mg by mouth 2 (two) times daily.      . RABEprazole (ACIPHEX) 20 MG tablet Take 20 mg by mouth every evening.      . valACYclovir (VALTREX) 1000 MG tablet Take 1,000 mg by mouth daily as needed (cold sores).      . venlafaxine [facility])  75 MG tablet Take 75 mg by mouth every evening.      . warfarin (COUMADIN) 3 MG tablet Take 1.5-3 mg by mouth every evening. 3mg  daily except Tuesday 1.5mg       . [DISCONTINUED] oxybutynin (DITROPAN) 5 MG tablet Take 1 tablet (5 mg total) by mouth 2 (two) times daily as needed.  180 tablet  1   No current facility-administered medications for this visit.    Allergies  Allergen Reactions  . Nizatidine     REACTION: Rash (Axid)  . Penicillins     REACTION: Rash  . Sulfadiazine     REACTION: Hives    Family History  Problem Relation Age of Onset  . Stroke Mother   . Hypertension Mother   . Cancer Brother     prostate with mets  . Blindness Brother     legally  . Diabetes Brother   . Pulmonary embolism Sister     from shoulder operation  . Alcohol abuse Brother   . Colon cancer Neg Hx   . Esophageal cancer Neg Hx   . Rectal cancer Neg Hx   . Stomach cancer Neg Hx     History   Social History  . Marital Status: Married    Spouse Name: N/A    Number of Children: 2  .  Years of Education: N/A   Occupational History  . 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 Smoking status: Never Smoker   . Smokeless tobacco: Never Used  . Alcohol Use: No  . Drug Use: No  . Sexual Activity: Not on file   Other Topics Concern  . Not on file   Social History Narrative   Married with 2 children   Husband of Keveon Amsler    Retired: was principal    Activity: walks dog 2-3 times daily about , frequent stops    Diet: some water, good fruits/vegetables, fish 1x/wk, no sodas.      Advanced directives: Avon Gully is wife, [name]. Does not want prolonged life support if terminal     Constitutional: Denies fever, malaise, fatigue, headache or abrupt weight changes.  Musculoskeletal: Denies decrease in range of motion, difficulty with gait, muscle pain.  Skin: Denies redness, rashes, lesions or ulcercations.  Neurological: Denies dizziness, difficulty with memory, difficulty with speech or problems with balance and coordination.   No other specific complaints in a complete review of systems (except as listed in HPI above).  Objective:   Physical Exam   BP 100/60  Pulse 74  Temp(Src) 98.4 F (36.9 C) (Oral)  Ht 5' 5.25" (1.657 m)  Wt 174 lb 4 oz (79.039 kg)  BMI 28.79 kg/m2 Wt Readings from Last 3 Encounters:  07/25/13 174 lb 4 oz (79.039 kg)  06/08/13 168 lb 8 oz (76.431 kg)  03/29/13 168 lb 8 oz (76.431 kg)    General: Appears his stated age, well developed, well nourished in NAD. Skin: Warm, dry and intact. No rashes, lesions or ulcerations noted.  Cardiovascular: Normal rate and rhythm. S1,S2 noted.  No murmur, rubs or gallops noted. No JVD or BLE edema. No carotid bruits noted. Pulmonary/Chest: Normal effort and positive vesicular breath sounds. No respiratory distress. No wheezes, rales or ronchi noted.  Musculoskeletal: Normal range of motion. No signs of joint swelling. Uses walker for difficulty with gait.  Tenderness noted along the MCL. Neurological: Alert and oriented. Cranial nerves II-XII intact. Coordination normal. +DTRs bilaterally.   BMET  Component Value Date/Time   NA 139 06/02/2013 1026   K 4.6 06/02/2013 1026   CL 106 06/02/2013 1026   CO2 29 06/02/2013 1026   GLUCOSE 91 06/02/2013 1026   BUN 27* 06/02/2013 1026   CREATININE 1.8* 06/02/2013 1026   CALCIUM 9.6 06/02/2013 1026   GFRNONAA 47* 10/24/2010 1445   GFRAA  Value: 57        The eGFR has been calculated using the MDRD equation. This calculation has not been validated in all clinical situations. eGFR's persistently <60 mL/min signify possible Chronic Kidney Disease.* 10/24/2010 1445    Lipid Panel     Component Value Date/Time   CHOL 134 06/02/2013 1026   TRIG 186.0* 06/02/2013 1026   HDL 25.30* 06/02/2013 1026   CHOLHDL 5 06/02/2013 1026   VLDL 37.2 06/02/2013 1026   LDLCALC 72 06/02/2013 1026    CBC    Component Value Date/Time   WBC 5.3 06/02/2013 1026   RBC 3.68* 06/02/2013 1026   HGB 13.3 06/02/2013 1026   HCT 38.6* 06/02/2013 1026   PLT 135.0* 06/02/2013 1026   MCV 104.9* 06/02/2013 1026   MCH 34.9* 10/24/2010 1445   MCHC 34.4 06/02/2013 1026   RDW 15.2* 06/02/2013 1026   LYMPHSABS 1.4 06/02/2013 1026   MONOABS 0.5 06/02/2013 1026   EOSABS 0.1 06/02/2013 1026   BASOSABS 0.0 06/02/2013 1026    Hgb A1C Lab Results  Component Value Date   HGBA1C 5.5 06/02/2013        Assessment & Plan:   Left knee pain and swelling, ? Injury of the MCL :  Erx for pred taper Strengthening exercises provided Wear a soft knee brace for support Tylenol for pain  May benefit from ultrasound or MRI of knee but will hold off unless pain persist ? 2 weeks  RTC as needed or if pain/swelling persist for > 5 days or worsens

## 2013-07-27 DIAGNOSIS — M999 Biomechanical lesion, unspecified: Secondary | ICD-10-CM | POA: Diagnosis not present

## 2013-07-27 DIAGNOSIS — M5430 Sciatica, unspecified side: Secondary | ICD-10-CM | POA: Diagnosis not present

## 2013-07-27 DIAGNOSIS — M5137 Other intervertebral disc degeneration, lumbosacral region: Secondary | ICD-10-CM | POA: Diagnosis not present

## 2013-07-29 DIAGNOSIS — M999 Biomechanical lesion, unspecified: Secondary | ICD-10-CM | POA: Diagnosis not present

## 2013-07-29 DIAGNOSIS — M5430 Sciatica, unspecified side: Secondary | ICD-10-CM | POA: Diagnosis not present

## 2013-07-29 DIAGNOSIS — M5137 Other intervertebral disc degeneration, lumbosacral region: Secondary | ICD-10-CM | POA: Diagnosis not present

## 2013-08-01 DIAGNOSIS — M5430 Sciatica, unspecified side: Secondary | ICD-10-CM | POA: Diagnosis not present

## 2013-08-01 DIAGNOSIS — M5137 Other intervertebral disc degeneration, lumbosacral region: Secondary | ICD-10-CM | POA: Diagnosis not present

## 2013-08-01 DIAGNOSIS — M999 Biomechanical lesion, unspecified: Secondary | ICD-10-CM | POA: Diagnosis not present

## 2013-08-11 ENCOUNTER — Ambulatory Visit: Payer: Medicare Other

## 2013-08-15 DIAGNOSIS — M999 Biomechanical lesion, unspecified: Secondary | ICD-10-CM | POA: Diagnosis not present

## 2013-08-15 DIAGNOSIS — M5137 Other intervertebral disc degeneration, lumbosacral region: Secondary | ICD-10-CM | POA: Diagnosis not present

## 2013-08-15 DIAGNOSIS — M5430 Sciatica, unspecified side: Secondary | ICD-10-CM | POA: Diagnosis not present

## 2013-08-16 DIAGNOSIS — H26499 Other secondary cataract, unspecified eye: Secondary | ICD-10-CM | POA: Diagnosis not present

## 2013-08-17 ENCOUNTER — Other Ambulatory Visit: Payer: Self-pay | Admitting: Family Medicine

## 2013-08-17 NOTE — Telephone Encounter (Signed)
Ok to refill 

## 2013-08-18 ENCOUNTER — Ambulatory Visit (INDEPENDENT_AMBULATORY_CARE_PROVIDER_SITE_OTHER): Payer: Medicare Other | Admitting: Family Medicine

## 2013-08-18 DIAGNOSIS — I82409 Acute embolism and thrombosis of unspecified deep veins of unspecified lower extremity: Secondary | ICD-10-CM | POA: Diagnosis not present

## 2013-08-18 DIAGNOSIS — Z5181 Encounter for therapeutic drug level monitoring: Secondary | ICD-10-CM | POA: Diagnosis not present

## 2013-08-18 DIAGNOSIS — Z7901 Long term (current) use of anticoagulants: Secondary | ICD-10-CM

## 2013-08-18 LAB — POCT INR: INR: 2.9

## 2013-08-25 ENCOUNTER — Other Ambulatory Visit: Payer: Self-pay | Admitting: Family Medicine

## 2013-08-29 DIAGNOSIS — M999 Biomechanical lesion, unspecified: Secondary | ICD-10-CM | POA: Diagnosis not present

## 2013-08-29 DIAGNOSIS — M5137 Other intervertebral disc degeneration, lumbosacral region: Secondary | ICD-10-CM | POA: Diagnosis not present

## 2013-08-29 DIAGNOSIS — M5430 Sciatica, unspecified side: Secondary | ICD-10-CM | POA: Diagnosis not present

## 2013-09-19 DIAGNOSIS — M999 Biomechanical lesion, unspecified: Secondary | ICD-10-CM | POA: Diagnosis not present

## 2013-09-19 DIAGNOSIS — M5137 Other intervertebral disc degeneration, lumbosacral region: Secondary | ICD-10-CM | POA: Diagnosis not present

## 2013-09-19 DIAGNOSIS — M5430 Sciatica, unspecified side: Secondary | ICD-10-CM | POA: Diagnosis not present

## 2013-09-22 ENCOUNTER — Ambulatory Visit (INDEPENDENT_AMBULATORY_CARE_PROVIDER_SITE_OTHER): Payer: Medicare Other | Admitting: Family Medicine

## 2013-09-22 DIAGNOSIS — Z7901 Long term (current) use of anticoagulants: Secondary | ICD-10-CM | POA: Diagnosis not present

## 2013-09-22 DIAGNOSIS — I82409 Acute embolism and thrombosis of unspecified deep veins of unspecified lower extremity: Secondary | ICD-10-CM | POA: Diagnosis not present

## 2013-09-22 DIAGNOSIS — Z5181 Encounter for therapeutic drug level monitoring: Secondary | ICD-10-CM | POA: Diagnosis not present

## 2013-09-22 LAB — POCT INR: INR: 2.1

## 2013-10-10 DIAGNOSIS — M5137 Other intervertebral disc degeneration, lumbosacral region: Secondary | ICD-10-CM | POA: Diagnosis not present

## 2013-10-10 DIAGNOSIS — M999 Biomechanical lesion, unspecified: Secondary | ICD-10-CM | POA: Diagnosis not present

## 2013-10-10 DIAGNOSIS — M5430 Sciatica, unspecified side: Secondary | ICD-10-CM | POA: Diagnosis not present

## 2013-10-12 DIAGNOSIS — H532 Diplopia: Secondary | ICD-10-CM | POA: Diagnosis not present

## 2013-10-20 ENCOUNTER — Ambulatory Visit (INDEPENDENT_AMBULATORY_CARE_PROVIDER_SITE_OTHER): Payer: Medicare Other | Admitting: Family Medicine

## 2013-10-20 DIAGNOSIS — Z5181 Encounter for therapeutic drug level monitoring: Secondary | ICD-10-CM | POA: Diagnosis not present

## 2013-10-20 DIAGNOSIS — Z7901 Long term (current) use of anticoagulants: Secondary | ICD-10-CM | POA: Diagnosis not present

## 2013-10-20 DIAGNOSIS — I82409 Acute embolism and thrombosis of unspecified deep veins of unspecified lower extremity: Secondary | ICD-10-CM | POA: Diagnosis not present

## 2013-10-20 LAB — POCT INR: INR: 2.4

## 2013-11-01 DIAGNOSIS — L821 Other seborrheic keratosis: Secondary | ICD-10-CM | POA: Diagnosis not present

## 2013-11-01 DIAGNOSIS — D239 Other benign neoplasm of skin, unspecified: Secondary | ICD-10-CM | POA: Diagnosis not present

## 2013-11-01 DIAGNOSIS — D485 Neoplasm of uncertain behavior of skin: Secondary | ICD-10-CM | POA: Diagnosis not present

## 2013-11-01 DIAGNOSIS — D216 Benign neoplasm of connective and other soft tissue of trunk, unspecified: Secondary | ICD-10-CM | POA: Diagnosis not present

## 2013-11-01 DIAGNOSIS — L578 Other skin changes due to chronic exposure to nonionizing radiation: Secondary | ICD-10-CM | POA: Diagnosis not present

## 2013-11-06 DIAGNOSIS — M810 Age-related osteoporosis without current pathological fracture: Secondary | ICD-10-CM

## 2013-11-06 HISTORY — DX: Age-related osteoporosis without current pathological fracture: M81.0

## 2013-11-07 DIAGNOSIS — M999 Biomechanical lesion, unspecified: Secondary | ICD-10-CM | POA: Diagnosis not present

## 2013-11-07 DIAGNOSIS — M5430 Sciatica, unspecified side: Secondary | ICD-10-CM | POA: Diagnosis not present

## 2013-11-07 DIAGNOSIS — M5137 Other intervertebral disc degeneration, lumbosacral region: Secondary | ICD-10-CM | POA: Diagnosis not present

## 2013-11-08 ENCOUNTER — Encounter: Payer: Self-pay | Admitting: Family Medicine

## 2013-11-09 NOTE — Telephone Encounter (Signed)
Please see Mychart message.

## 2013-11-12 DIAGNOSIS — M5430 Sciatica, unspecified side: Secondary | ICD-10-CM | POA: Diagnosis not present

## 2013-11-12 DIAGNOSIS — M5137 Other intervertebral disc degeneration, lumbosacral region: Secondary | ICD-10-CM | POA: Diagnosis not present

## 2013-11-12 DIAGNOSIS — M999 Biomechanical lesion, unspecified: Secondary | ICD-10-CM | POA: Diagnosis not present

## 2013-11-14 ENCOUNTER — Ambulatory Visit (INDEPENDENT_AMBULATORY_CARE_PROVIDER_SITE_OTHER): Payer: Medicare Other | Admitting: Family Medicine

## 2013-11-14 ENCOUNTER — Encounter: Payer: Self-pay | Admitting: Family Medicine

## 2013-11-14 VITALS — BP 118/76 | HR 76 | Temp 97.8°F | Wt 174.0 lb

## 2013-11-14 DIAGNOSIS — Z23 Encounter for immunization: Secondary | ICD-10-CM | POA: Diagnosis not present

## 2013-11-14 DIAGNOSIS — K219 Gastro-esophageal reflux disease without esophagitis: Secondary | ICD-10-CM

## 2013-11-14 DIAGNOSIS — R197 Diarrhea, unspecified: Secondary | ICD-10-CM | POA: Diagnosis not present

## 2013-11-14 DIAGNOSIS — M7062 Trochanteric bursitis, left hip: Secondary | ICD-10-CM

## 2013-11-14 DIAGNOSIS — M810 Age-related osteoporosis without current pathological fracture: Secondary | ICD-10-CM | POA: Diagnosis not present

## 2013-11-14 DIAGNOSIS — R269 Unspecified abnormalities of gait and mobility: Secondary | ICD-10-CM

## 2013-11-14 DIAGNOSIS — M76899 Other specified enthesopathies of unspecified lower limb, excluding foot: Secondary | ICD-10-CM

## 2013-11-14 MED ORDER — DEXLANSOPRAZOLE 60 MG PO CPDR: 60.0000 mg | DELAYED_RELEASE_CAPSULE | Freq: Every day | ORAL | Status: DC

## 2013-11-14 MED ORDER — GABAPENTIN 100 MG PO CAPS: 100.0000 mg | ORAL_CAPSULE | Freq: Three times a day (TID) | ORAL | Status: DC

## 2013-11-14 NOTE — Patient Instructions (Addendum)
prevnar shot today. Blood work today. Stool studies sent home container today. Stop aciphex and start dexilant. For left hip - I think you have bursitis of your left hip.  Do stretching exercises provided today.  If not better let me know to set you up with Dr. Lorelei Pont. Good to see you today, call us with questions.

## 2013-11-14 NOTE — Progress Notes (Signed)
Pre visit review using our clinic review tool, if applicable. No additional management support is needed unless otherwise documented below in the visit note. 

## 2013-11-14 NOTE — Progress Notes (Signed)
BP 118/76  Pulse 76  Temp(Src) 97.8 F (36.6 C) (Oral)  Wt 174 lb (78.926 kg)   CC: discuss PT recs  Subjective:    Patient ID: [name], male    DOB: 1930/11/15, 80 y.o.   MRN: [id number]  HPI: [name] is a 80 y.o. male presenting on 11/14/2013 with Follow-up   Presents with wife [name] today.  Recent DEXA at [facility] found osteoporosis with T score -2.7.  DEXA asked to scann today. I also reviewed PT recommendations - ?rpt neurological evaluation but unclear why. They may look into [facility] group exercise program.  Any food or fluid intake causes pain.  Increasing diarrhea over last 6 weeks despite lactose free diet - uses lactaid when cooking, using almond milk.  Explosive fluid + soft stool.  Occasionally wakes him up but mostly during the day.  One day had presyncopal episode with diarrhea.  No black tarry stool, no clay colored stool.  No blood in stool.  + bowel incontinence.  Poor sphinter tone.  No recent abx use.  Worsening GERD despite aciphex 20mg  daily. Did go through mirlax cleanse which didn't help.  Fall 10/28/2013 at night time tripped on rug at home, hit left hip/posterior buttock.   Saw Dr. Mabeline [name] - s/p 2 skin lesion excisions, one pending rpt surgery.  appt scheduled 01/04/2014.  Coumadin indication was h/o personal PE and strong fmhx PE.  rec stop coumadin for 5 days prior to surgery, then restart night after procedure.  No fevers/chills, no vomiting, nausea. Wt Readings from Last 3 Encounters:  11/14/13 174 lb (78.926 kg)  07/25/13 174 lb 4 oz (79.039 kg)  06/08/13 168 lb 8 oz (76.431 kg)  Body mass index is 28.75 kg/(m^2).  Relevant past medical, surgical, family and social history reviewed and updated as indicated.  Allergies and medications reviewed and updated. Current Outpatient Prescriptions on File Prior to Visit  Medication Sig  . albuterol (PROVENTIL HFA;VENTOLIN HFA) 108 (90 BASE) MCG/ACT inhaler Inhale 2 puffs into the lungs every 6  (six) hours as needed for wheezing or shortness of breath.  Marland Kitchen atorvastatin (LIPITOR) 40 MG tablet TAKE 1 TABLET AT BEDTIME  . cholecalciferol (VITAMIN D) 1000 UNITS tablet Take 1,000 Units by mouth every evening.   . Coenzyme Q10 (COQ-10) 100 MG CAPS Take 1 capsule by mouth at bedtime.   . Cyanocobalamin (B-12 PO) Take 1 tablet by mouth every other day.  . fenofibrate (TRICOR) 145 MG tablet TAKE 1 TABLET DAILY  . fexofenadine (ALLEGRA) 180 MG tablet Take 180 mg by mouth every evening.   Marland Kitchen oxybutynin (DITROPAN) 5 MG tablet Take 5 mg by mouth 2 (two) times daily.  . RABEprazole (ACIPHEX) 20 MG tablet TAKE 1 TABLET DAILY  . valACYclovir (VALTREX) 1000 MG tablet Take 1,000 mg by mouth daily as needed (cold sores).  . venlafaxine (EFFEXOR) 75 MG tablet TAKE 1 TABLET DAILY  . warfarin (COUMADIN) 3 MG tablet TAKE 1 TABLET BY MOUTH AS DIRECTED  . Multiple Vitamins-Minerals (MULTIVITAMIN PO) Take 1 tablet by mouth every morning.   . [DISCONTINUED] oxybutynin (DITROPAN) 5 MG tablet Take 1 tablet (5 mg total) by mouth 2 (two) times daily as needed.   No current facility-administered medications on file prior to visit.    Review of Systems Per HPI unless specifically indicated above    Objective:    BP 118/76  Pulse 76  Temp(Src) 97.8 F (36.6 C) (Oral)  Wt 174 lb (78.926 kg)  Physical  Exam  Nursing note and vitals reviewed. Constitutional: He appears well-developed and well-nourished. No distress.  HENT:  Head: Normocephalic and atraumatic.  Mouth/Throat: No oropharyngeal exudate.  Slightly dry mm  Eyes: Conjunctivae and EOM are normal. Pupils are equal, round, and reactive to light. No scleral icterus.  Neck: Normal range of motion. Neck supple.  Cardiovascular: Normal rate, regular rhythm, normal heart sounds and intact distal pulses.   No murmur heard. Pulmonary/Chest: Effort normal and breath sounds normal. No respiratory distress. He has no wheezes. He has no rales.  Abdominal:  Soft. Normal appearance and bowel sounds are normal. He exhibits no distension and no mass. There is no hepatosplenomegaly. There is tenderness (mild) in the epigastric area. There is no rigidity, no rebound, no guarding and negative Murphy's sign.  Musculoskeletal: He exhibits no edema.  Neg SLR bilaterally No pain with int/ext rotation at hip Tender to palpation left GTB Bruising evident inferior to L buttocks/trochanteric bursa  Skin: Skin is warm and dry. No rash noted.       Assessment & Plan:   Problem List Items Addressed This Visit   Diarrhea     6 wk h/o subacute diarrhea.  Check for infectious cause today with stool culture and c diff EIA.  Check blood work (TSH,CBC,CMP, lipase) If unrevealing, consider GI referral.    Relevant Orders      Stool culture      Clostridium difficile EIA      Comprehensive metabolic panel      TSH      CBC with Differential      Lipase   GERD (gastroesophageal reflux disease)     H/o PUD and strictures.Marland Kitchen GERD has worsened despite aciphex 20mg  daily. Will change to dexilant 60mg  once daily.  If persistent, consider return to GI. S/p EGD 02/2012 with dilation    Relevant Medications      dexlansoprazole (DEXILANT) 60 MG capsule   Osteoporosis - Primary     On latest dexa at [facility].  Compliant with vit D. Hesitant for bisphosphonate 2/2 GI and CKD issues. Consider IV vs forteo - further discuss next visit.    Trochanteric bursitis of left hip     Anticipate L greater trochanteric bursitis. Provided with stretching exercises from [facility] pt advisor. If no improvement noted after 1 wk, low threshold to refer to [facility] for discussion of bursa injection. No evidence of hip fracture today.    UNSTEADY GAIT     Has been attending [employer] physical therapy program - has completed 12 sessions.  Was recommended inscription into regular exercise program at [facility] - pt/wife will look into affordability of this.     Other Visit Diagnoses    Immunization due        Relevant Orders       Pneumococcal conjugate vaccine 13-valent (Completed)        Follow up plan: Return if symptoms worsen or fail to improve.

## 2013-11-15 DIAGNOSIS — M7062 Trochanteric bursitis, left hip: Secondary | ICD-10-CM | POA: Insufficient documentation

## 2013-11-15 LAB — LIPASE: Lipase: 14 U/L (ref 11.0–59.0)

## 2013-11-15 LAB — COMPREHENSIVE METABOLIC PANEL
ALT: 20 U/L (ref 0–53)
AST: 27 U/L (ref 0–37)
Albumin: 3.6 g/dL (ref 3.5–5.2)
Alkaline Phosphatase: 50 U/L (ref 39–117)
BUN: 28 mg/dL — ABNORMAL HIGH (ref 6–23)
CALCIUM: 9.1 mg/dL (ref 8.4–10.5)
CHLORIDE: 108 meq/L (ref 96–112)
CO2: 28 mEq/L (ref 19–32)
CREATININE: 1.7 mg/dL — AB (ref 0.4–1.5)
GFR: 40.83 mL/min — ABNORMAL LOW (ref >60.00)
Glucose, Bld: 87 mg/dL (ref 70–99)
POTASSIUM: 4.4 meq/L (ref 3.5–5.1)
SODIUM: 140 meq/L (ref 135–145)
TOTAL PROTEIN: 6.2 g/dL (ref 6.0–8.3)
Total Bilirubin: 0.5 mg/dL (ref 0.3–1.2)

## 2013-11-15 LAB — CBC WITH DIFFERENTIAL/PLATELET
BASOS ABS: 0.1 10*3/uL (ref 0.0–0.1)
BASOS PCT: 1.1 % (ref 0.0–3.0)
EOS ABS: 0.2 10*3/uL (ref 0.0–0.7)
Eosinophils Relative: 3.6 % (ref 0.0–5.0)
HCT: 33.8 % — ABNORMAL LOW (ref 39.0–52.0)
Hemoglobin: 11.4 g/dL — ABNORMAL LOW (ref 13.0–17.0)
Lymphocytes Relative: 28.4 % (ref 12.0–46.0)
Lymphs Abs: 1.6 10*3/uL (ref 0.7–4.0)
MCHC: 33.8 g/dL (ref 30.0–36.0)
MCV: 106.5 fl — AB (ref 78.0–100.0)
MONO ABS: 0.4 10*3/uL (ref 0.1–1.0)
Monocytes Relative: 6.7 % (ref 3.0–12.0)
NEUTROS ABS: 3.4 10*3/uL (ref 1.4–7.7)
NEUTROS PCT: 60.2 % (ref 43.0–77.0)
Platelets: 146 10*3/uL — ABNORMAL LOW (ref 150.0–400.0)
RBC: 3.17 Mil/uL — AB (ref 4.22–5.81)
RDW: 15.1 % — AB (ref 11.5–14.6)
WBC: 5.6 10*3/uL (ref 4.5–10.5)

## 2013-11-15 LAB — TSH: TSH: 1.02 u[IU]/mL (ref 0.35–5.50)

## 2013-11-15 NOTE — Assessment & Plan Note (Signed)
Has been attending [employer] physical therapy program - has completed 12 sessions.  Was recommended inscription into regular exercise program at [facility] - pt/wife will look into affordability of this.

## 2013-11-15 NOTE — Assessment & Plan Note (Signed)
On latest dexa at [facility].  Compliant with vit D. Hesitant for bisphosphonate 2/2 GI and CKD issues. Consider IV vs forteo - further discuss next visit.

## 2013-11-15 NOTE — Assessment & Plan Note (Addendum)
6 wk h/o subacute diarrhea.  Check for infectious cause today with stool culture and c diff EIA.  Check blood work (TSH,CBC,CMP, lipase) If unrevealing, consider GI referral.

## 2013-11-15 NOTE — Assessment & Plan Note (Signed)
Anticipate L greater trochanteric bursitis. Provided with stretching exercises from [facility] pt advisor. If no improvement noted after 1 wk, low threshold to refer to [facility] for discussion of bursa injection. No evidence of hip fracture today.

## 2013-11-15 NOTE — Assessment & Plan Note (Signed)
H/o PUD and strictures.Marland Kitchen GERD has worsened despite aciphex 20mg  daily. Will change to dexilant 60mg  once daily.  If persistent, consider return to GI. S/p EGD 02/2012 with dilation

## 2013-11-17 NOTE — Addendum Note (Signed)
Addended by: Ellamae Sia on: 11/17/2013 05:14 PM   Modules accepted: Orders

## 2013-11-21 ENCOUNTER — Encounter: Payer: Self-pay | Admitting: Family Medicine

## 2013-11-21 DIAGNOSIS — R197 Diarrhea, unspecified: Secondary | ICD-10-CM | POA: Diagnosis not present

## 2013-11-21 NOTE — Addendum Note (Signed)
Addended by: Ellamae Sia on: 11/21/2013 04:46 PM   Modules accepted: Orders

## 2013-11-22 LAB — CLOSTRIDIUM DIFFICILE EIA: CDIFTX: NEGATIVE

## 2013-11-23 ENCOUNTER — Encounter: Payer: Self-pay | Admitting: Family Medicine

## 2013-11-23 ENCOUNTER — Ambulatory Visit (INDEPENDENT_AMBULATORY_CARE_PROVIDER_SITE_OTHER): Payer: Medicare Other | Admitting: Family Medicine

## 2013-11-23 VITALS — BP 100/50 | HR 67 | Temp 97.6°F | Ht 65.25 in | Wt 173.0 lb

## 2013-11-23 DIAGNOSIS — M7062 Trochanteric bursitis, left hip: Secondary | ICD-10-CM

## 2013-11-23 DIAGNOSIS — Z5181 Encounter for therapeutic drug level monitoring: Secondary | ICD-10-CM | POA: Diagnosis not present

## 2013-11-23 DIAGNOSIS — Z7901 Long term (current) use of anticoagulants: Secondary | ICD-10-CM

## 2013-11-23 DIAGNOSIS — M76899 Other specified enthesopathies of unspecified lower limb, excluding foot: Secondary | ICD-10-CM

## 2013-11-23 NOTE — Progress Notes (Signed)
Pre visit review using our clinic review tool, if applicable. No additional management support is needed unless otherwise documented below in the visit note. 

## 2013-11-23 NOTE — Progress Notes (Signed)
Date:  11/23/2013   Name:  [name]   DOB:  12/03/1930   MRN:  [id number]  Primary Physician:  Eustaquio Boyden, MD   Chief Complaint: Hip Pain   Subjective:   History of Present Illness:  [name] is a 80 y.o. pleasant patient who presents with the following:  Pleasant elderly gentleman who about 3 weeks fell and struck the left side of his lateral hip. He had extensive bruising and contusion which has been resolving. He primarily is having pain at this point on the lateral aspect of his hip. He denies any significant back pain. He also is having some bilateral knee pain without any effusion. This is all been ongoing in the last 2-3 weeks.  He is chronically anticoagulated on Coumadin.  Patient Active Problem List   Diagnosis Date Noted  . Trochanteric bursitis of left hip 11/15/2013  . Diarrhea 11/14/2013  . Osteoporosis 11/06/2013  . Encounter for therapeutic drug monitoring 10/20/2013  . Memory difficulty 06/08/2013  . Vertigo 01/28/2013  . Bilateral hearing loss 06/07/2012  . GERD (gastroesophageal reflux disease) 04/30/2012  . Dysphagia 12/30/2011  . HLD (hyperlipidemia) 12/08/2011  . Depression   . Medicare annual wellness visit, subsequent 06/09/2011  . UNSTEADY GAIT 04/16/2010  . VITAMIN B12 DEFICIENCY 11/01/2008  . Unspecified vitamin D deficiency 11/01/2008  . Essential tremor 09/27/2008  . EMBOLISM & INFARCTION, IATROGENIC PULMONARY 03/09/2007  . HSV 03/08/2007  . THROMBOCYTOPENIA, PRIMARY NOS 03/08/2007  . CORONARY ARTERY DISEASE 03/08/2007  . CKD (chronic kidney disease) stage 3, GFR 30-59 ml/min 03/08/2007  . INCONTINENCE, URGE 03/08/2007  . RENAL CALCULUS, HX OF 03/08/2007  . THROMBOPHLEBITIS 01/04/2007    Past Medical History  Diagnosis Date  . GERD (gastroesophageal reflux disease)     h/o PUD  . CAD (coronary artery disease)     cath 2000 30% single vessel, normal nuclear stress test 12/03/2010, no evidence ischemia  . Renal  insufficiency     baseline Cr 1.7  . Depression   . Hx pulmonary embolism 08/1991    coumadin  . History of phlebitis   . Allergic rhinitis   . Urge incontinence   . Elevated PSA     previous-normalized (followed by Dr. Annabell Howells, rec no repeat unless urinary sxs)  . Anemia   . HLD (hyperlipidemia)     hypertriglyceridemia  . Asthma     remote  . Arthritis   . Blood transfusion 1990's  . Schatzki's ring 02/2012    s/p dilation Christella Hartigan)  . History of fracture of right hip   . Hearing loss 06/2012    eval - rec annual exam  . Fall against object 06/2012    against water cooler  . Essential tremor 09/27/2008  . Prediabetes   . DISH (diffuse idiopathic skeletal hyperostosis) 03/2013    lumbar spine on xray  . DDD (degenerative disc disease) 03/2013    by CT, diffuse multilevel cervical and lumbar spondylosis  . Osteoporosis 11/2013    DEX AT score -2.7    Past Surgical History  Procedure Laterality Date  . Tonsillectomy  1965  . Hernia repair  1979    Left  . Cystoscopy  1992    for kidney stones  . Exploratory laparotomy  1996    with incidental appendectomy  . Appendectomy  1996  . Cardiac catheterization  2000    30% one vessel  . Knee arthroscopy  03/24/02    Right (Dr. Gerrit Heck)  . Orif  femoral neck fracture w/ dhs  08/03/02    Right (Dr. Gerrit Heck)  . Ct abd w & pelvis wo cm  07/2001    Scarring of right lung, stable negative o/w  . Ct abd w & pelvis wo cm  11/2000    ? stones, right LL scarring  . V/q scan  06/1999    negative  . Colonoscopy  1998    [place]  . US echocardiography  12/28/2007    Mild aortic valve calcification EF 55%, basically nml  . Carotid u/s  12/28/2007    nml  . Eeg  11/12/2009    nml  . Mri  11/2009    Head, nml  . Cataract extraction  Jan, March 2011    bilateral  . Colonoscopy  07/2010    5 polyps, adenomatous, rec rpt 3 yrs  . Esophagogastroduodenoscopy  02/2012    dilation of schatzki's ring Christella Hartigan)    History   Social  History  . Marital Status: Married    Spouse Name: N/A    Number of Children: 2  . Years of Education: N/A   Occupational History  . 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 Smoking status: Never Smoker   . Smokeless tobacco: Never Used  . Alcohol Use: No  . Drug Use: No  . Sexual Activity: Not on file   Other Topics Concern  . Not on file   Social History Narrative   Married with 2 children   Husband of Alben Kosier    Retired: was principal    Activity: walks dog 2-3 times daily about , frequent stops    Diet: some water, good fruits/vegetables, fish 1x/wk, no sodas.      Advanced directives: Avon Gully is wife, [name]. Does not want prolonged life support if terminal    Family History  Problem Relation Age of Onset  . Stroke Mother   . Hypertension Mother   . Cancer Brother     prostate with mets  . Blindness Brother     legally  . Diabetes Brother   . Pulmonary embolism Sister     from shoulder operation  . Alcohol abuse Brother   . Colon cancer Neg Hx   . Esophageal cancer Neg Hx   . Rectal cancer Neg Hx   . Stomach cancer Neg Hx     Allergies  Allergen Reactions  . Nizatidine     REACTION: Rash (Axid)  . Penicillins     REACTION: Rash  . Sulfadiazine     REACTION: Hives    Medication list has been reviewed and updated.  Review of Systems:  GEN: No fevers, chills. Nontoxic. Primarily MSK c/o today. MSK: Detailed in the HPI GI: tolerating PO intake without difficulty Neuro: No numbness, parasthesias, or tingling associated. Otherwise the pertinent positives of the ROS are noted above.   Objective:   Physical Examination: BP 100/50  Pulse 67  Temp(Src) 97.6 F (36.4 C) (Oral)  Ht 5' 5.25" (1.657 m)  Wt 173 lb (78.472 kg)  BMI 28.58 kg/m2  Ideal Body Weight: Weight in (lb) to have BMI = 25: 151.1   GEN: WDWN, NAD, Non-toxic, Alert & Oriented x 3 HEENT: Atraumatic, Normocephalic.  Ears and  Nose: No external deformity. EXTR: No clubbing/cyanosis/edema NEURO: Normal gait.  PSYCH: Normally interactive. Conversant. Not depressed or anxious appearing.  Calm demeanor.   HIP EXAM: SIDE: L ROM: Abduction, Flexion, Internal and External range of  motion: 30% loss of motion on the L Pain with terminal IROM and EROM: mild GTB: markedly TTP SLR: NEG Knees: No effusion FABER: NT REVERSE FABER: NT, neg Piriformis: NT at direct palpation Str: flexion: 5/5 abduction: 5/5 adduction: 5/5 Strength testing non-tender  Knee:  b Gait: Normal heel toe pattern ROM: 0-125 Effusion: neg Echymosis or edema: none Patellar tendon NT Painful PLICA: neg Patellar grind: negative Medial and lateral patellar facet loading: negative medial and lateral joint lines:NT Mcmurray's neg Flexion-pinch neg Varus and valgus stress: stable Lachman: neg Ant and Post drawer: neg Hip abduction, IR, ER: WNL Hip flexion str: 5/5 Hip abd: 5/5 Quad: 5/5 VMO atrophy:No Hamstring concentric and eccentric: 5/5    No results found.  Assessment & Plan:   Trochanteric bursitis of left hip  Anticoagulated on Coumadin  Traumatic trochanteric bursitis of the left hip.  Likely secondary knee pain secondary to trauma, fall, contusion and trochanteric bursitis. Almost certainly there is bone contusion as well.  Trochanteric Bursitis Injection, LEFT Verbal consent obtained. Risks (including infection, potential atrophy), benefits, and alternatives reviewed. Greater trochanter sterilely prepped with Chloraprep. Ethyl Chloride used for anesthesia. 8 cc of Lidocaine 1% injected with 2 cc of 40 mg Depo-Medrol into trochanteric bursa at area of maximal tenderness at greater trochanter. Needle taken to bone to troch bursa, flows easily. Bursa massaged. No bleeding and no complications. Decreased pain after injection. Needle: 22 gauge 1 1/2 in. needle  Patient's Medications  New Prescriptions   No medications on  file  Previous Medications   ALBUTEROL (PROVENTIL HFA;VENTOLIN HFA) 108 (90 BASE) MCG/ACT INHALER    Inhale 2 puffs into the lungs every 6 (six) hours as needed for wheezing or shortness of breath.   ATORVASTATIN (LIPITOR) 40 MG TABLET    TAKE 1 TABLET AT BEDTIME   CHOLECALCIFEROL (VITAMIN D) 1000 UNITS TABLET    Take 1,000 Units by mouth every evening.    COENZYME Q10 (COQ-10) 100 MG CAPS    Take 1 capsule by mouth at bedtime.    CYANOCOBALAMIN (B-12 PO)    Take 1 tablet by mouth every other day.   DEXLANSOPRAZOLE (DEXILANT) 60 MG CAPSULE    Take 1 capsule (60 mg total) by mouth daily.   FENOFIBRATE (TRICOR) 145 MG TABLET    TAKE 1 TABLET DAILY   FEXOFENADINE (ALLEGRA) 180 MG TABLET    Take 180 mg by mouth every evening.    GABAPENTIN (NEURONTIN) 100 MG CAPSULE    Take 1 capsule (100 mg total) by mouth 3 (three) times daily.   MULTIPLE VITAMINS-MINERALS (MULTIVITAMIN PO)    Take 1 tablet by mouth every morning.    OXYBUTYNIN (DITROPAN) 5 MG TABLET    Take 5 mg by mouth 2 (two) times daily.   RABEPRAZOLE (ACIPHEX) 20 MG TABLET    TAKE 1 TABLET DAILY   VALACYCLOVIR (VALTREX) 1000 MG TABLET    Take 1,000 mg by mouth daily as needed (cold sores).   VENLAFAXINE (EFFEXOR) 75 MG TABLET    TAKE 1 TABLET DAILY   WARFARIN (COUMADIN) 3 MG TABLET    TAKE 1 TABLET BY MOUTH AS DIRECTED  Modified Medications   No medications on file  Discontinued Medications   No medications on file   There are no Patient Instructions on file for this visit.  Signed,  Elpidio Galea. Haig Gerardo, MD, CAQ Sports Medicine  [employer] at [facility] [address] [place] [address] Phone: [phone] Fax: [phone]

## 2013-11-25 LAB — STOOL CULTURE

## 2013-11-27 ENCOUNTER — Encounter: Payer: Self-pay | Admitting: Family Medicine

## 2013-11-27 DIAGNOSIS — R197 Diarrhea, unspecified: Secondary | ICD-10-CM

## 2013-11-27 DIAGNOSIS — K219 Gastro-esophageal reflux disease without esophagitis: Secondary | ICD-10-CM

## 2013-11-29 ENCOUNTER — Encounter: Payer: Self-pay | Admitting: Gastroenterology

## 2013-11-29 ENCOUNTER — Ambulatory Visit (INDEPENDENT_AMBULATORY_CARE_PROVIDER_SITE_OTHER): Payer: Medicare Other | Admitting: Gastroenterology

## 2013-11-29 VITALS — BP 114/64 | HR 70 | Ht 65.0 in | Wt 172.0 lb

## 2013-11-29 DIAGNOSIS — R197 Diarrhea, unspecified: Secondary | ICD-10-CM

## 2013-11-29 MED ORDER — METRONIDAZOLE 250 MG PO TABS: 250.0000 mg | ORAL_TABLET | Freq: Three times a day (TID) | ORAL | Status: DC

## 2013-11-29 NOTE — Patient Instructions (Signed)
Stool pathogen panel (to [facility] location) Empiric trial of flagyl (one pill three times a day for 10 days). Call in 2 weeks to report on your symptom.

## 2013-11-29 NOTE — Progress Notes (Signed)
Review of pertinent gastrointestinal problems:  1. several adenomatous polyps removed from his colon November 2011, recommended for repeat colonoscopy at 3 year interval  2. chronic loose stools, colonoscopy November 2011 to the terminal ileum was normal except for polyps. Random colon biopsies showed no microscopic colitis  3. Dysphagia: 01/2012 barium esophagram suggested a slight stricture at the GE junction and he will need EGD with dilation for this. However the esophagram also suggests laryngeal penetration, probable aspiration. the radiologist suggested formal modi modified barium swallow study with speech therapy. This was done 02/2012: The MBSS suggest that the main problem is from the esophageal stricture (probably does not also have oropharyngeal swallowing problem). EGD 02/2012 found and dilated Schatki's ring (up to 18mm). This helped but only briefly, repeat EGD 07/2012 same finding, dilated to 67mm for 2 minutes. This very much helped.  4. Epigastric pain: 2011 CT, then [place] then HIDA were all essentially unrevealing.   HPI: This is a  very pleasant 80 year old man who is here with his wife today.  He is clinically lactose intolerant.    Taking several imodium every day, 4 per day.   Feels he would have 5-6 BMs per day unless he was on imodium.  Watery, can be explosive at times.  Weight has been down a bit.  Diarrhea comes and goes.  Currently for 4-5 months.     Past Medical History  Diagnosis Date  . GERD (gastroesophageal reflux disease)     h/o PUD  . CAD (coronary artery disease)     cath 2000 30% single vessel, normal nuclear stress test 12/03/2010, no evidence ischemia  . Renal insufficiency     baseline Cr 1.7  . Depression   . Hx pulmonary embolism 08/1991    coumadin  . History of phlebitis   . Allergic rhinitis   . Urge incontinence   . Elevated PSA     previous-normalized (followed by Dr. Jeffie [name], rec no repeat unless urinary sxs)  . Anemia   . HLD  (hyperlipidemia)     hypertriglyceridemia  . Asthma     remote  . Arthritis   . Blood transfusion 1990's  . Schatzki's ring 02/2012    s/p dilation [name])  . History of fracture of right hip   . Hearing loss 06/2012    eval - rec annual exam  . Fall against object 06/2012    against water cooler  . Essential tremor 09/27/2008  . Prediabetes   . DISH (diffuse idiopathic skeletal hyperostosis) 03/2013    lumbar spine on xray  . DDD (degenerative disc disease) 03/2013    by CT, diffuse multilevel cervical and lumbar spondylosis  . Osteoporosis 11/2013    DEX AT score -2.7    Past Surgical History  Procedure Laterality Date  . Tonsillectomy  1965  . Hernia repair  1979    Left  . Cystoscopy  1992    for kidney stones  . Exploratory laparotomy  1996    with incidental appendectomy  . Appendectomy  1996  . Cardiac catheterization  2000    30% one vessel  . Knee arthroscopy  03/24/02    Right (Dr. Mauri [name])  . Orif femoral neck fracture w/ dhs  08/03/02    Right (Dr. Mauri [name])  . Ct abd w & pelvis wo cm  07/2001    Scarring of right lung, stable negative o/w  . Ct abd w & pelvis wo cm  11/2000    ? stones, right LL  scarring  . V/q scan  06/1999    negative  . [place]oscopy  1998    [place]  . US echocardiography  12/28/2007    Mild aortic valve calcification EF 55%, basically nml  . Carotid u/s  12/28/2007    nml  . Eeg  11/12/2009    nml  . Mri  11/2009    Head, nml  . Cataract extraction  Jan, March 2011    bilateral  . Colonoscopy  07/2010    5 polyps, adenomatous, rec rpt 3 yrs  . Esophagogastroduodenoscopy  02/2012    dilation of schatzki's ring [name])    Current Outpatient Prescriptions  Medication Sig Dispense Refill  . albuterol (PROVENTIL HFA;VENTOLIN HFA) 108 (90 BASE) MCG/ACT inhaler Inhale 2 puffs into the lungs every 6 (six) hours as needed for wheezing or shortness of breath.  1 Inhaler  3  . atorvastatin (LIPITOR) 40 MG tablet TAKE 1 TABLET AT BEDTIME   90 tablet  3  . cholecalciferol (VITAMIN D) 1000 UNITS tablet Take 1,000 Units by mouth every evening.       . Coenzyme Q10 (COQ-10) 100 MG CAPS Take 1 capsule by mouth at bedtime.       . Cyanocobalamin (B-12 PO) Take 1 tablet by mouth every other day.      Marland Kitchen dexlansoprazole (DEXILANT) 60 MG capsule Take 1 capsule (60 mg total) by mouth daily.  30 capsule  1  . fenofibrate (TRICOR) 145 MG tablet TAKE 1 TABLET DAILY  90 tablet  3  . fexofenadine (ALLEGRA) 180 MG tablet Take 180 mg by mouth every evening.       . gabapentin (NEURONTIN) 100 MG capsule Take 1 capsule (100 mg total) by mouth 3 (three) times daily.  270 capsule  3  . oxybutynin (DITROPAN) 5 MG tablet Take 5 mg by mouth 2 (two) times daily.      . valACYclovir (VALTREX) 1000 MG tablet Take 1,000 mg by mouth daily as needed (cold sores).      . venlafaxine (EFFEXOR) 75 MG tablet TAKE 1 TABLET DAILY  90 tablet  1  . warfarin (COUMADIN) 3 MG tablet TAKE 1 TABLET BY MOUTH AS DIRECTED  30 tablet  0  . [DISCONTINUED] oxybutynin (DITROPAN) 5 MG tablet Take 1 tablet (5 mg total) by mouth 2 (two) times daily as needed.  180 tablet  1   No current facility-administered medications for this visit.    Allergies as of 11/29/2013 - Review Complete 11/29/2013  Allergen Reaction Noted  . Nizatidine    . Penicillins    . Sulfadiazine      Family History  Problem Relation Age of Onset  . Stroke Mother   . Hypertension Mother   . Cancer Brother     prostate with mets  . Blindness Brother     legally  . Diabetes Brother   . Pulmonary embolism Sister     from shoulder operation  . Alcohol abuse Brother   . Colon cancer Neg Hx   . Esophageal cancer Neg Hx   . Rectal cancer Neg Hx   . Stomach cancer Neg Hx     History   Social History  . Marital Status: Married    Spouse Name: N/A    Number of Children: 2  . Years of Education: N/A   Occupational History  . 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 Smoking status: Never Smoker   .  Smokeless tobacco: Never Used  . Alcohol Use: No  . Drug Use: No  . Sexual Activity: Not on file   Other Topics Concern  . Not on file   Social History Narrative   Married with 2 children   Husband of Izek Corvino    Retired: was principal    Activity: walks dog 2-3 times daily about 41min, frequent stops    Diet: some water, good fruits/vegetables, fish 1x/wk, no sodas.      Advanced directives: Chauncey Reading is wife, [name]. Does not want prolonged life support if terminal      Physical Exam: BP 114/64  Pulse 70  Ht 5\' 5"  (1.651 m)  Wt 172 lb (78.019 kg)  BMI 28.62 kg/m2 Constitutional: generally well-appearing Psychiatric: alert and oriented x3 Abdomen: soft, nontender, nondistended, no obvious ascites, no peritoneal signs, normal bowel sounds     Assessment and plan: 80 y.o. male with chronic intermittent diarrhea  Colonoscopy 2011 was essentially normal, microscopic biopsies were normal. He has had this problem for quite a long time. Comes and goes and long bouts. Lately he has been on Imodium up to 4 times a day. Clostridium difficile and routine stool culture was negative earlier this week. I'm GI pathogen panel sent for him and I will start him on empiric metronidazole. He will call to report on his response to Flagyl in 2 weeks.

## 2013-11-30 ENCOUNTER — Other Ambulatory Visit: Payer: Medicare Other

## 2013-11-30 DIAGNOSIS — R634 Abnormal weight loss: Secondary | ICD-10-CM | POA: Diagnosis not present

## 2013-11-30 DIAGNOSIS — R109 Unspecified abdominal pain: Secondary | ICD-10-CM | POA: Diagnosis not present

## 2013-11-30 DIAGNOSIS — R197 Diarrhea, unspecified: Secondary | ICD-10-CM

## 2013-12-01 ENCOUNTER — Telehealth: Payer: Self-pay | Admitting: Internal Medicine

## 2013-12-01 ENCOUNTER — Ambulatory Visit (INDEPENDENT_AMBULATORY_CARE_PROVIDER_SITE_OTHER): Payer: Medicare Other | Admitting: Family Medicine

## 2013-12-01 DIAGNOSIS — Z7901 Long term (current) use of anticoagulants: Secondary | ICD-10-CM | POA: Diagnosis not present

## 2013-12-01 DIAGNOSIS — I82409 Acute embolism and thrombosis of unspecified deep veins of unspecified lower extremity: Secondary | ICD-10-CM | POA: Diagnosis not present

## 2013-12-01 DIAGNOSIS — Z5181 Encounter for therapeutic drug level monitoring: Secondary | ICD-10-CM

## 2013-12-01 LAB — GASTROINTESTINAL PATHOGEN PANEL PCR
C. DIFFICILE TOX A/B, PCR: NEGATIVE
Campylobacter, PCR: NEGATIVE
Cryptosporidium, PCR: NEGATIVE
E COLI (ETEC) LT/ST, PCR: NEGATIVE
E COLI 0157, PCR: NEGATIVE
E coli (STEC) stx1/stx2, PCR: NEGATIVE
GIARDIA LAMBLIA, PCR: NEGATIVE
Norovirus, PCR: NEGATIVE
Rotavirus A, PCR: POSITIVE — CR
SHIGELLA, PCR: NEGATIVE
Salmonella, PCR: NEGATIVE

## 2013-12-01 LAB — POCT INR: INR: 1.9

## 2013-12-01 NOTE — Telephone Encounter (Signed)
Lab called re Rotovirus PCR +

## 2013-12-05 DIAGNOSIS — M999 Biomechanical lesion, unspecified: Secondary | ICD-10-CM | POA: Diagnosis not present

## 2013-12-05 DIAGNOSIS — M5430 Sciatica, unspecified side: Secondary | ICD-10-CM | POA: Diagnosis not present

## 2013-12-05 DIAGNOSIS — M5137 Other intervertebral disc degeneration, lumbosacral region: Secondary | ICD-10-CM | POA: Diagnosis not present

## 2013-12-07 ENCOUNTER — Ambulatory Visit (INDEPENDENT_AMBULATORY_CARE_PROVIDER_SITE_OTHER): Payer: Medicare Other | Admitting: Family Medicine

## 2013-12-07 ENCOUNTER — Encounter: Payer: Self-pay | Admitting: Family Medicine

## 2013-12-07 VITALS — BP 108/60 | HR 67 | Temp 98.0°F | Wt 164.0 lb

## 2013-12-07 DIAGNOSIS — M810 Age-related osteoporosis without current pathological fracture: Secondary | ICD-10-CM

## 2013-12-07 DIAGNOSIS — R197 Diarrhea, unspecified: Secondary | ICD-10-CM | POA: Diagnosis not present

## 2013-12-07 DIAGNOSIS — E559 Vitamin D deficiency, unspecified: Secondary | ICD-10-CM | POA: Diagnosis not present

## 2013-12-07 NOTE — Assessment & Plan Note (Signed)
Appreciate GI care.  rotovirus +.  Finish empiric flagyl and f/u with GI.

## 2013-12-07 NOTE — Progress Notes (Signed)
Pre visit review using our clinic review tool, if applicable. No additional management support is needed unless otherwise documented below in the visit note. 

## 2013-12-07 NOTE — Patient Instructions (Signed)
Keep appointment for follow up with Dr. Aura Fey flagyl. No changes to medicines today.   We could consider prolia in the future. For now, continue vitamin D 2000 units daily and try to get 1200mg  calcium from the diet daily. Good to see you today, call us with questions

## 2013-12-07 NOTE — Progress Notes (Signed)
BP 108/60  Pulse 67  Temp(Src) 98 F (36.7 C) (Oral)  Wt 164 lb (74.39 kg)  SpO2 98%   CC: 6 mo f/u  Subjective:    Patient ID: [name], male    DOB: May 13, 1931, 80 y.o.   MRN: [id number]  HPI: [name] is a 80 y.o. male presenting on 12/07/2013 for Follow-up   See recent note for details. Recently evaluated for diarrhea by GI, PCR positive for rotovirus.  Flagyl has significantly helped.  Has stopped using immodium.  Appetite good.  Osteoporosis - recent dexa -2.7 at [facility].  Last visit   Lab Results  Component Value Date   CREATININE 1.7* 11/14/2013   Wt Readings from Last 3 Encounters:  12/07/13 164 lb (74.39 kg)  11/29/13 172 lb (78.019 kg)  11/23/13 173 lb (78.472 kg)    Relevant past medical, surgical, family and social history reviewed and updated as indicated.  Allergies and medications reviewed and updated. Current Outpatient Prescriptions on File Prior to Visit  Medication Sig  . albuterol (PROVENTIL HFA;VENTOLIN HFA) 108 (90 BASE) MCG/ACT inhaler Inhale 2 puffs into the lungs every 6 (six) hours as needed for wheezing or shortness of breath.  Marland Kitchen atorvastatin (LIPITOR) 40 MG tablet TAKE 1 TABLET AT BEDTIME  . Coenzyme Q10 (COQ-10) 100 MG CAPS Take 1 capsule by mouth at bedtime.   . Cyanocobalamin (B-12 PO) Take 1 tablet by mouth every other day.  Marland Kitchen dexlansoprazole (DEXILANT) 60 MG capsule Take 1 capsule (60 mg total) by mouth daily.  . fenofibrate (TRICOR) 145 MG tablet TAKE 1 TABLET DAILY  . fexofenadine (ALLEGRA) 180 MG tablet Take 180 mg by mouth every evening.   . gabapentin (NEURONTIN) 100 MG capsule Take 1 capsule (100 mg total) by mouth 3 (three) times daily.  . metroNIDAZOLE (FLAGYL) 250 MG tablet Take 1 tablet (250 mg total) by mouth 3 (three) times daily.  Marland Kitchen oxybutynin (DITROPAN) 5 MG tablet Take 5 mg by mouth 2 (two) times daily.  . valACYclovir (VALTREX) 1000 MG tablet Take 1,000 mg by mouth daily as needed (cold sores).  . venlafaxine  (EFFEXOR) 75 MG tablet TAKE 1 TABLET DAILY  . warfarin (COUMADIN) 3 MG tablet TAKE 1 TABLET BY MOUTH AS DIRECTED  . [DISCONTINUED] oxybutynin (DITROPAN) 5 MG tablet Take 1 tablet (5 mg total) by mouth 2 (two) times daily as needed.   No current facility-administered medications on file prior to visit.    Review of Systems Per HPI unless specifically indicated above    Objective:    BP 108/60  Pulse 67  Temp(Src) 98 F (36.7 C) (Oral)  Wt 164 lb (74.39 kg)  SpO2 98%  Physical Exam  Nursing note and vitals reviewed. Constitutional: He appears well-developed and well-nourished. No distress.  HENT:  Mouth/Throat: Oropharynx is clear and moist. No oropharyngeal exudate.  Neck: Normal range of motion. Neck supple. No thyromegaly present.  Cardiovascular: Normal rate, regular rhythm, normal heart sounds and intact distal pulses.   No murmur heard. Pulmonary/Chest: Effort normal and breath sounds normal. No respiratory distress. He has no wheezes. He has no rales.  Musculoskeletal: He exhibits no edema.   Results for orders placed in visit on 12/01/13  POCT INR      Result Value Ref Range   INR 1.9        Assessment & Plan:   Problem List Items Addressed This Visit   Unspecified vitamin D deficiency - Primary     Continue  2000 IU daily.  Anticipate good control.  Check level next blood work.    Osteoporosis     Again reviewed dx and treatment options. H/o renal calculus so desires to avoid forteo. Avoid bisphosphonate 2/2 GI issues along with h/o CKD. Discussed prolia - desires to avoid for now in hopes of having dental implant in future (currenlty unable to afford financially). Will just continue cal /vit D supplementation, again reviewed recommended daily amt.    Relevant Medications      Cholecalciferol (VITAMIN D) 2000 UNITS CAPS   Diarrhea     Appreciate GI care.  rotovirus +.  Finish empiric flagyl and f/u with GI.        Follow up plan: Return if symptoms  worsen or fail to improve.

## 2013-12-07 NOTE — Assessment & Plan Note (Signed)
Continue 2000 IU daily.  Anticipate good control.  Check level next blood work.

## 2013-12-07 NOTE — Assessment & Plan Note (Signed)
Again reviewed dx and treatment options. H/o renal calculus so desires to avoid forteo. Avoid bisphosphonate 2/2 GI issues along with h/o CKD. Discussed prolia - desires to avoid for now in hopes of having dental implant in future (currenlty unable to afford financially). Will just continue cal /vit D supplementation, again reviewed recommended daily amt.

## 2014-01-04 DIAGNOSIS — D485 Neoplasm of uncertain behavior of skin: Secondary | ICD-10-CM | POA: Diagnosis not present

## 2014-01-05 ENCOUNTER — Encounter: Payer: Self-pay | Admitting: Family Medicine

## 2014-01-05 ENCOUNTER — Ambulatory Visit (INDEPENDENT_AMBULATORY_CARE_PROVIDER_SITE_OTHER): Payer: Medicare Other | Admitting: Family Medicine

## 2014-01-05 VITALS — BP 100/62 | HR 62 | Temp 97.1°F | Wt 171.1 lb

## 2014-01-05 DIAGNOSIS — M6530 Trigger finger, unspecified finger: Secondary | ICD-10-CM | POA: Diagnosis not present

## 2014-01-05 NOTE — Progress Notes (Signed)
BP 100/62  Pulse 62  Temp(Src) 97.1 F (36.2 C) (Oral)  Wt 171 lb 1.9 oz (77.62 kg)   CC: hand pain, bilateral  Subjective:    Patient ID: [name], male    DOB: Jul 31, 1931, 80 y.o.   MRN: [id number]  HPI: [name] is a 80 y.o. male presenting on 01/05/2014 for Acute Visit   Recent derm procedure on back - s/p re excision yesterday. atypical nevus pending rpt path.  [name] presents today with shooting pain down bilateral hands R>L with occasional fingers locking in place.  Needs to self unlock fingers.  Tends to affect 3rd/4th fingers bilaterally.  R side ongoing for last 6+ mo, L hand started 3 wks ago.  Denies numbness, tingling or burning pain.  No wrist pain.  No joint pain or swelling or redness or warmth.  Tends to happen when he reads a book or newspaper.  Relevant past medical, surgical, family and social history reviewed and updated as indicated.  Allergies and medications reviewed and updated. Current Outpatient Prescriptions on File Prior to Visit  Medication Sig  . albuterol (PROVENTIL HFA;VENTOLIN HFA) 108 (90 BASE) MCG/ACT inhaler Inhale 2 puffs into the lungs every 6 (six) hours as needed for wheezing or shortness of breath.  Marland Kitchen atorvastatin (LIPITOR) 40 MG tablet TAKE 1 TABLET AT BEDTIME  . Cholecalciferol (VITAMIN D) 2000 UNITS CAPS Take 1 capsule by mouth daily.  . Coenzyme Q10 (COQ-10) 100 MG CAPS Take 1 capsule by mouth at bedtime.   . Cyanocobalamin (B-12 PO) Take 1 tablet by mouth every other day.  Marland Kitchen dexlansoprazole (DEXILANT) 60 MG capsule Take 1 capsule (60 mg total) by mouth daily.  . fenofibrate (TRICOR) 145 MG tablet TAKE 1 TABLET DAILY  . fexofenadine (ALLEGRA) 180 MG tablet Take 180 mg by mouth every evening.   . gabapentin (NEURONTIN) 100 MG capsule Take 1 capsule (100 mg total) by mouth 3 (three) times daily.  Marland Kitchen oxybutynin (DITROPAN) 5 MG tablet Take 5 mg by mouth 2 (two) times daily.  . valACYclovir (VALTREX) 1000 MG tablet Take 1,000 mg  by mouth daily as needed (cold sores).  . venlafaxine (EFFEXOR) 75 MG tablet TAKE 1 TABLET DAILY  . warfarin (COUMADIN) 3 MG tablet TAKE 1 TABLET BY MOUTH AS DIRECTED  . metroNIDAZOLE (FLAGYL) 250 MG tablet Take 1 tablet (250 mg total) by mouth 3 (three) times daily.  . [DISCONTINUED] oxybutynin (DITROPAN) 5 MG tablet Take 1 tablet (5 mg total) by mouth 2 (two) times daily as needed.   No current facility-administered medications on file prior to visit.    Review of Systems Per HPI unless specifically indicated above    Objective:    BP 100/62  Pulse 62  Temp(Src) 97.1 F (36.2 C) (Oral)  Wt 171 lb 1.9 oz (77.62 kg)  Physical Exam  Nursing note and vitals reviewed. Constitutional: He appears well-developed and well-nourished. No distress.  Musculoskeletal: He exhibits no edema.  Mild tenderness to palpation just proximal to palmar MCPs bilateral hands > at R 2nd, 4th, 5th digits and L 2nd -5th digits, nodules along palmar metacarpals appreciated as well  Neurological:  Sensation intact to temperature and light touch, grip strength intact, intrinsic finger mm strength intact  Skin: Skin is warm and dry. No rash noted.       Assessment & Plan:   Problem List Items Addressed This Visit   Trigger finger, acquired - Primary     Gives story consistent with trigger  fingers and nodules appreciated on exam. Pt and wife request referral to Dr. Amedeo Plenty to discuss trigger finger steroid injections. Will refer today.        Follow up plan: No Follow-up on file.

## 2014-01-05 NOTE — Patient Instructions (Signed)
Pass by Marion's office for referral for trigger fingers.  Trigger Finger Trigger finger (digital tendinitis and stenosing tenosynovitis) is a common disorder that causes an often painful catching of the fingers or thumb. It occurs as a clicking, snapping, or locking of a finger in the palm of the hand. This is caused by a problem with the tendons that flex or bend the fingers sliding smoothly through their sheaths. The condition may occur in any finger or a couple fingers at the same time.  The finger may lock with the finger curled or suddenly straighten out with a snap. This is more common in patients with rheumatoid arthritis and diabetes. Left untreated, the condition may get worse to the point where the finger becomes locked in flexion, like making a fist, or less commonly locked with the finger straightened out. CAUSES   Inflammation and scarring that lead to swelling around the tendon sheath.  Repeated or forceful movements.  Rheumatoid arthritis, an autoimmune disease that affects joints.  Gout.  Diabetes mellitus. SIGNS AND SYMPTOMS  Soreness and swelling of your finger.  A painful clicking or snapping as you bend and straighten your finger. DIAGNOSIS  Your health care provider will do a physical exam of your finger to diagnose trigger finger. TREATMENT   Splinting for 6 8 weeks may be helpful.  Nonsteroidal anti-inflammatory medicines (NSAIDs) can help to relieve the pain and inflammation.  Cortisone injections, along with splinting, may speed up recovery. Several injections may be required. Cortisone may give relief after one injection.  Surgery is another treatment that may be used if conservative treatments do not work. Surgery can be minor, without incisions (a cut does not have to be made), and can be done with a needle through the skin.  Other surgical choices involve an open procedure in which the surgeon opens the hand through a small incision and cuts the pulley  so the tendon can again slide smoothly. Your hand will still work fine. HOME CARE INSTRUCTIONS  Apply ice to the injured area, twice per day:  Put ice in a plastic bag.  Place a towel between your skin and the bag.  Leave the ice on for 20 minutes, 3 4 times a day.  Rest your hand often. MAKE SURE YOU:   Understand these instructions.  Will watch your condition.  Will get help right away if you are not doing well or get worse. Document Released: 06/14/2004 Document Revised: 04/27/2013 Document Reviewed: 01/25/2013 [facility] Patient Information 2014 [place].

## 2014-01-05 NOTE — Progress Notes (Signed)
Pre visit review using our clinic review tool, if applicable. No additional management support is needed unless otherwise documented below in the visit note. 

## 2014-01-05 NOTE — Assessment & Plan Note (Signed)
Gives story consistent with trigger fingers and nodules appreciated on exam. Pt and wife request referral to Dr. Amedeo Plenty to discuss trigger finger steroid injections. Will refer today.

## 2014-01-12 ENCOUNTER — Ambulatory Visit (INDEPENDENT_AMBULATORY_CARE_PROVIDER_SITE_OTHER): Payer: Medicare Other | Admitting: Family Medicine

## 2014-01-12 DIAGNOSIS — Z7901 Long term (current) use of anticoagulants: Secondary | ICD-10-CM

## 2014-01-12 DIAGNOSIS — I82409 Acute embolism and thrombosis of unspecified deep veins of unspecified lower extremity: Secondary | ICD-10-CM | POA: Diagnosis not present

## 2014-01-12 DIAGNOSIS — Z5181 Encounter for therapeutic drug level monitoring: Secondary | ICD-10-CM

## 2014-01-12 LAB — POCT INR: INR: 1.6

## 2014-01-17 ENCOUNTER — Encounter: Payer: Self-pay | Admitting: Family Medicine

## 2014-01-17 ENCOUNTER — Ambulatory Visit (INDEPENDENT_AMBULATORY_CARE_PROVIDER_SITE_OTHER): Payer: Medicare Other | Admitting: Family Medicine

## 2014-01-17 VITALS — BP 126/64 | HR 72 | Temp 97.6°F | Wt 172.2 lb

## 2014-01-17 DIAGNOSIS — R42 Dizziness and giddiness: Secondary | ICD-10-CM | POA: Diagnosis not present

## 2014-01-17 MED ORDER — MECLIZINE HCL 25 MG PO TABS: 12.5000 mg | ORAL_TABLET | Freq: Three times a day (TID) | ORAL | Status: DC | PRN

## 2014-01-17 NOTE — Progress Notes (Signed)
Pre visit review using our clinic review tool, if applicable. No additional management support is needed unless otherwise documented below in the visit note. 

## 2014-01-17 NOTE — Assessment & Plan Note (Addendum)
I don't see central cause today - anticipate more peripheral vertigo either vestibular neuritis or BPPV. Discussed this, rec meclizine, provided with epley canalith repositioning maneuvers. Encouraged he stay well hydrated as he also was somewhat orthostatic on vitals today. Update if sxs persist or worsen.  Would consider further imaging in known microvascular disease and chronic atrophy.  Pt /wife agree with plan. Endorses known diplopia followed by ophtho in past (treating with prisms in glasses).

## 2014-01-17 NOTE — Progress Notes (Signed)
BP 126/64  Pulse 72  Temp(Src) 97.6 F (36.4 C) (Oral)  Wt 172 lb 4 oz (78.132 kg)   CC: dizziness  Subjective:    Patient ID: [name], male    DOB: 07-02-1931, 80 y.o.   MRN: [id number]  HPI: ORRIS PERIN is a 80 y.o. male presenting on 01/17/2014 for Dizziness   1d h/o dizziness that started as stumbling when standing, then today when he awoke had trouble walking to bathroom - needed wife's assistance.  Some nausea with vertigo.  Describes vertigo sxs.  Denies tinnitus, hearing changes, vision changes, headache.  Dizziness mainly with standing.  Vertigo episode lasted 30 minutes this morning.  No recent viral infections.  Has been taking allegra and flonase for allergies.  bp runs low.  Some orthostasis on exam today, states he doesn't drink as much water as he should.  (03/21/2013) CT HEAD  Findings: Extensive patchy and confluent areas of decreased  attenuation throughout the deep and periventricular white matter of  the cerebral hemispheres bilaterally, similar to the prior study,  compatible with chronic microvascular ischemic disease. Mild  cerebral and cerebellar atrophy with mild ex vacuo dilatation of  the ventricular system. Cavum septum pellucidum (normal anatomical  variant) incidentally noted. No acute displaced skull fractures are  identified. No acute intracranial abnormality. Specifically, no  evidence of acute post-traumatic intracranial hemorrhage, no  definite regions of acute/subacute cerebral ischemia, no focal  mass, mass effect, hydrocephalus or abnormal intra or extra-axial  fluid collections. The visualized paranasal sinuses and mastoids  are well pneumatized.  IMPRESSION:  1. No evidence of significant acute traumatic injury to the head  or brain.  2. Mild cerebral and cerebellar atrophy with extensive chronic  microvascular ischemic changes in the cerebral white matter  redemonstrated, as above.   Relevant past medical, surgical,  family and social history reviewed and updated as indicated.  Allergies and medications reviewed and updated. Current Outpatient Prescriptions on File Prior to Visit  Medication Sig  . albuterol (PROVENTIL HFA;VENTOLIN HFA) 108 (90 BASE) MCG/ACT inhaler Inhale 2 puffs into the lungs every 6 (six) hours as needed for wheezing or shortness of breath.  Marland Kitchen atorvastatin (LIPITOR) 40 MG tablet TAKE 1 TABLET AT BEDTIME  . Cholecalciferol (VITAMIN D) 2000 UNITS CAPS Take 1 capsule by mouth daily.  . Coenzyme Q10 (COQ-10) 100 MG CAPS Take 1 capsule by mouth at bedtime.   . Cyanocobalamin (B-12 PO) Take 1 tablet by mouth every other day.  Marland Kitchen dexlansoprazole (DEXILANT) 60 MG capsule Take 1 capsule (60 mg total) by mouth daily.  . fenofibrate (TRICOR) 145 MG tablet TAKE 1 TABLET DAILY  . fexofenadine (ALLEGRA) 180 MG tablet Take 180 mg by mouth every evening.   . gabapentin (NEURONTIN) 100 MG capsule Take 1 capsule (100 mg total) by mouth 3 (three) times daily.  . metroNIDAZOLE (FLAGYL) 250 MG tablet Take 1 tablet (250 mg total) by mouth 3 (three) times daily.  Marland Kitchen oxybutynin (DITROPAN) 5 MG tablet Take 5 mg by mouth 2 (two) times daily.  . valACYclovir (VALTREX) 1000 MG tablet Take 1,000 mg by mouth daily as needed (cold sores).  . venlafaxine (EFFEXOR) 75 MG tablet TAKE 1 TABLET DAILY  . warfarin (COUMADIN) 3 MG tablet TAKE 1 TABLET BY MOUTH AS DIRECTED  . [DISCONTINUED] oxybutynin (DITROPAN) 5 MG tablet Take 1 tablet (5 mg total) by mouth 2 (two) times daily as needed.   No current facility-administered medications on file prior to visit.  Review of Systems Per HPI unless specifically indicated above    Objective:    BP 126/64  Pulse 72  Temp(Src) 97.6 F (36.4 C) (Oral)  Wt 172 lb 4 oz (78.132 kg)  Physical Exam  Nursing note and vitals reviewed. Constitutional: He is oriented to person, place, and time. He appears well-developed and well-nourished. No distress.  HENT:  Mouth/Throat:  Oropharynx is clear and moist. No oropharyngeal exudate.  Eyes: Conjunctivae and EOM are normal. Pupils are equal, round, and reactive to light. No scleral icterus.  Cardiovascular: Normal rate, regular rhythm, normal heart sounds and intact distal pulses.   No murmur heard. Pulmonary/Chest: Effort normal and breath sounds normal. No respiratory distress. He has no wheezes. He has no rales.  Musculoskeletal: He exhibits no edema.  Neurological: He is alert and oriented to person, place, and time. No cranial nerve deficit or sensory deficit.  Slightly unsteady with romberg but able to stand with eyes closed. CN 2-12 intact. FTN impaired 2/2 tremor not coordination. dix hallpike overall negative, but does have slight horizontal nystagmus to left and faint vertigo when sits back up  Skin: Skin is warm and dry. No rash noted.   Results for orders placed in visit on 01/12/14  POCT INR      Result Value Ref Range   INR 1.6        Assessment & Plan:   Problem List Items Addressed This Visit   Vertigo - Primary     I don't see central cause today - anticipate more peripheral vertigo either vestibular neuritis or BPPV. Discussed this, rec meclizine, provided with epley canalith repositioning maneuvers. Encouraged he stay well hydrated as he also was somewhat orthostatic on vitals today. Update if sxs persist or worsen.  Pt /wife agree with plan.        Follow up plan: Return if symptoms worsen or fail to improve.

## 2014-01-17 NOTE — Patient Instructions (Signed)
I think this is a peripheral cause of vertigo - may be vestibular neuritis or benign positional vertigo. May use meclizine as needed for vertigo. Try repositioning maneuvers provided today. If worsening symptoms let me know. Drink more water.

## 2014-01-21 ENCOUNTER — Other Ambulatory Visit: Payer: Self-pay | Admitting: Family Medicine

## 2014-01-24 ENCOUNTER — Telehealth: Payer: Self-pay | Admitting: Family Medicine

## 2014-01-24 NOTE — Telephone Encounter (Signed)
Fax received from [facility] to approve change in manufacturer.  Signed by Dr. Danise Mina and faxed to [phone].  Sent to be scanned.

## 2014-01-29 ENCOUNTER — Other Ambulatory Visit: Payer: Self-pay | Admitting: Family Medicine

## 2014-01-31 ENCOUNTER — Ambulatory Visit (INDEPENDENT_AMBULATORY_CARE_PROVIDER_SITE_OTHER): Payer: Medicare Other | Admitting: Gastroenterology

## 2014-01-31 ENCOUNTER — Encounter: Payer: Self-pay | Admitting: Gastroenterology

## 2014-01-31 VITALS — BP 100/60 | HR 72 | Ht 65.0 in | Wt 171.4 lb

## 2014-01-31 DIAGNOSIS — R1310 Dysphagia, unspecified: Secondary | ICD-10-CM

## 2014-01-31 NOTE — Patient Instructions (Addendum)
Continue taking your dexilant once daily. Call if your diarrhea returns.  Would likely start empiric flagyl again since it worked so well this time. You will be set up for an upper endoscopy for dysphagia. We will communicate with Dr. Dionisio Paschal about holding your coumadin for 5 days as has been done in the past.

## 2014-01-31 NOTE — Progress Notes (Signed)
Review of pertinent gastrointestinal problems:  1. several adenomatous polyps removed from his colon November 2011, recommended for repeat colonoscopy at 3 year interval  2. chronic loose stools, colonoscopy November 2011 to the terminal ileum was normal except for polyps. Random colon biopsies showed no microscopic colitis.  GI pathogen panel 11/2013 + for rotavirus, unclear if this was clinically relevant.  Empiric trial of Flagyl was very helpful and effective in 2015. 3. Dysphagia: 01/2012 barium esophagram suggested a slight stricture at the GE junction and he will need EGD with dilation for this. However the esophagram also suggests laryngeal penetration, probable aspiration. the radiologist suggested formal modi modified barium swallow study with speech therapy. This was done 02/2012: The MBSS suggest that the main problem is from the esophageal stricture (probably does not also have oropharyngeal swallowing problem). EGD 02/2012 found and dilated Schatki's ring (up to 73mm). This helped but only briefly, repeat EGD 07/2012 same finding, dilated to 22mm for 2 minutes. This very much helped.  4. Epigastric pain: 2011 CT, then [place] then HIDA were all essentially unrevealing.  HPI: This is a  very pleasant 80 year old man who I last saw about 3 months ago. He is here with his wife today.  Started flagyl after last visit.  Very soon after starting his bowels normalized.  He is not requiring any imodium at all anymore.  Has been on dexilant for 3-4 months or so.  It really helps his pyrosis. Wife concerned about interaction with renal, coumadin.  He mentioned difficulty with swallowing occasionally. This is his typical solid food dysphagia that occurs generally about a year or so after his last dilation. He is not losing weight.   Past Medical History  Diagnosis Date  . GERD (gastroesophageal reflux disease)     h/o PUD  . CAD (coronary artery disease)     cath 2000 30% single vessel, normal nuclear  stress test 12/03/2010, no evidence ischemia  . Renal insufficiency     baseline Cr 1.7  . Depression   . Hx pulmonary embolism 08/1991    coumadin  . History of phlebitis   . Allergic rhinitis   . Urge incontinence   . Elevated PSA     previous-normalized (followed by Dr. Jeffie Pollock, rec no repeat unless urinary sxs)  . Anemia   . HLD (hyperlipidemia)     hypertriglyceridemia  . Asthma     remote  . Arthritis   . Blood transfusion 1990's  . Schatzki's ring 02/2012    s/p dilation [name]  . History of fracture of right hip   . Hearing loss 06/2012    eval - rec annual exam  . Essential tremor 09/27/2008  . Prediabetes   . DISH (diffuse idiopathic skeletal hyperostosis) 03/2013    lumbar spine on xray  . DDD (degenerative disc disease) 03/2013    by CT, diffuse multilevel cervical and lumbar spondylosis  . Osteoporosis 11/2013    DEX AT score -2.7    Past Surgical History  Procedure Laterality Date  . Tonsillectomy  1965  . Hernia repair  1979    Left  . Cystoscopy  1992    for kidney stones  . Exploratory laparotomy  1996    with incidental appendectomy  . Appendectomy  1996  . Cardiac catheterization  2000    30% one vessel  . Knee arthroscopy  03/24/02    Right (Dr. Mauri Pole)  . Orif femoral neck fracture w/ dhs  08/03/02    Right (Dr.  Califf)  . Ct abd w & pelvis wo cm  07/2001    Scarring of right lung, stable negative o/w  . Ct abd w & pelvis wo cm  11/2000    ? stones, right LL scarring  . V/q scan  06/1999    negative  . Colonoscopy  1998    [place]  . US echocardiography  12/28/2007    Mild aortic valve calcification EF 55%, basically nml  . Carotid u/s  12/28/2007    nml  . Eeg  11/12/2009    nml  . Mri  11/2009    Head, nml  . Cataract extraction  Jan, March 2011    bilateral  . Colonoscopy  07/2010    5 polyps, adenomatous, rec rpt 3 yrs  . Esophagogastroduodenoscopy  02/2012    dilation of schatzki's ring [name]    Current Outpatient  Prescriptions  Medication Sig Dispense Refill  . albuterol (PROVENTIL HFA;VENTOLIN HFA) 108 (90 BASE) MCG/ACT inhaler Inhale 2 puffs into the lungs every 6 (six) hours as needed for wheezing or shortness of breath.  1 Inhaler  3  . atorvastatin (LIPITOR) 40 MG tablet TAKE 1 TABLET AT BEDTIME  90 tablet  3  . Cholecalciferol (VITAMIN D) 2000 UNITS CAPS Take 1 capsule by mouth daily.      . Coenzyme Q10 (COQ-10) 100 MG CAPS Take 1 capsule by mouth at bedtime.       . Cyanocobalamin (B-12 PO) Take 1 tablet by mouth every other day.      Marland Kitchen dexlansoprazole (DEXILANT) 60 MG capsule Take 1 capsule (60 mg total) by mouth daily.  30 capsule  1  . fenofibrate (TRICOR) 145 MG tablet TAKE 1 TABLET DAILY  90 tablet  3  . fexofenadine (ALLEGRA) 180 MG tablet Take 180 mg by mouth every evening.       . gabapentin (NEURONTIN) 100 MG capsule Take 1 capsule (100 mg total) by mouth 3 (three) times daily.  270 capsule  3  . meclizine (ANTIVERT) 25 MG tablet Take 0.5-1 tablets (12.5-25 mg total) by mouth 3 (three) times daily as needed for dizziness.  30 tablet  0  . oxybutynin (DITROPAN) 5 MG tablet TAKE 1 TABLET TWICE A DAY AS NEEDED  180 tablet  2  . valACYclovir (VALTREX) 1000 MG tablet Take 1,000 mg by mouth daily as needed (cold sores).      . venlafaxine (EFFEXOR) 75 MG tablet TAKE 1 TABLET DAILY  90 tablet  3  . warfarin (COUMADIN) 3 MG tablet TAKE 1 TABLET AS DIRECTED  90 tablet  3  . [DISCONTINUED] oxybutynin (DITROPAN) 5 MG tablet Take 1 tablet (5 mg total) by mouth 2 (two) times daily as needed.  180 tablet  1   No current facility-administered medications for this visit.    Allergies as of 01/31/2014 - Review Complete 01/31/2014  Allergen Reaction Noted  . Nizatidine    . Penicillins    . Sulfadiazine      Family History  Problem Relation Age of Onset  . Stroke Mother   . Hypertension Mother   . Cancer Brother     prostate with mets  . Blindness Brother     legally  . Diabetes Brother    . Pulmonary embolism Sister     from shoulder operation  . Alcohol abuse Brother   . Colon cancer Neg Hx   . Esophageal cancer Neg Hx   . Rectal cancer Neg Hx   .  Stomach cancer Neg Hx     History   Social History  . Marital Status: Married    Spouse Name: N/A    Number of Children: 2  . Years of Education: N/A   Occupational History  . 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 Smoking status: Never Smoker   . Smokeless tobacco: Never Used  . Alcohol Use: No  . Drug Use: No  . Sexual Activity: Not on file   Other Topics Concern  . Not on file   Social History Narrative   Married with 2 children   Husband of Misha Antonini    Retired: was principal    Activity: walks dog 2-3 times daily about 26min, frequent stops    Diet: some water, good fruits/vegetables, fish 1x/wk, no sodas.      Advanced directives: Chauncey Reading is wife, [name]. Does not want prolonged life support if terminal      Physical Exam: BP 100/60  Pulse 72  Ht 5\' 5"  (1.651 m)  Wt 171 lb 6.4 oz (77.747 kg)  BMI 28.52 kg/m2 Constitutional: generally well-appearing Psychiatric: alert and oriented x3 Abdomen: soft, nontender, nondistended, no obvious ascites, no peritoneal signs, normal bowel sounds     Assessment and plan: 80 y.o. male with improved diarrhea, recurrent dysphagia, history of esophageal stricture  I am glad that his diarrhea has improved so well. I don't think that he truly had clinical rotavirus especially given his very dramatic reaction to Flagyl. If he has recurrent diarrhea as he tends to do every year or so then we will try Flagyl in. Cleaning again. His dysphagia his back and we'll proceed with EGD if it is okay to hold his Coumadin. We'll communicate with his primary care physician about that. He will continue proton pump inhibitor once daily.

## 2014-02-01 ENCOUNTER — Telehealth: Payer: Self-pay | Admitting: Family Medicine

## 2014-02-01 NOTE — Telephone Encounter (Signed)
Received request re coumadin dosing for upcoming surgery (see letter) - agree with holding coumadin 5 d prior to procedure then restart the night of the procedure. plz notify Dr. Eugenia Pancoast CMA. Will route to [place] as FYI.

## 2014-02-01 NOTE — Telephone Encounter (Signed)
This message sent to [facility] for Dr. [name].   Patty- call me if you have any questions. Maudie Mercury

## 2014-02-02 ENCOUNTER — Telehealth: Payer: Self-pay

## 2014-02-02 NOTE — Telephone Encounter (Signed)
Pt no longer on aciphex, current script is dexilant 60mg  once daily.  [facility] will need to check with pt if desired through mail order.

## 2014-02-02 NOTE — Telephone Encounter (Signed)
[facility] left v/m; pt has refill aciphex 20 mg but [facility] saw that pt had refill for dexilant at local pharmacy. [facility] wants to know if should send out refill of aciphex 20 mg to pt. And what pts current therapy should be. [facility] request cb at [phone] using ref # [id number]

## 2014-02-02 NOTE — Telephone Encounter (Signed)
Pt aware ok to hold coumadin as directed

## 2014-02-03 NOTE — Telephone Encounter (Signed)
[facility] notified and will cancel the aciphex.

## 2014-02-13 DIAGNOSIS — H35389 Toxic maculopathy, unspecified eye: Secondary | ICD-10-CM | POA: Diagnosis not present

## 2014-02-20 ENCOUNTER — Ambulatory Visit (AMBULATORY_SURGERY_CENTER): Payer: Medicare Other | Admitting: Gastroenterology

## 2014-02-20 ENCOUNTER — Encounter: Payer: Self-pay | Admitting: Gastroenterology

## 2014-02-20 ENCOUNTER — Telehealth: Payer: Self-pay | Admitting: Gastroenterology

## 2014-02-20 VITALS — BP 113/65 | HR 60 | Temp 96.8°F | Resp 19 | Ht 65.0 in | Wt 171.0 lb

## 2014-02-20 DIAGNOSIS — F329 Major depressive disorder, single episode, unspecified: Secondary | ICD-10-CM | POA: Diagnosis not present

## 2014-02-20 DIAGNOSIS — I2510 Atherosclerotic heart disease of native coronary artery without angina pectoris: Secondary | ICD-10-CM | POA: Diagnosis not present

## 2014-02-20 DIAGNOSIS — N289 Disorder of kidney and ureter, unspecified: Secondary | ICD-10-CM | POA: Diagnosis not present

## 2014-02-20 DIAGNOSIS — R1310 Dysphagia, unspecified: Secondary | ICD-10-CM | POA: Diagnosis not present

## 2014-02-20 DIAGNOSIS — F3289 Other specified depressive episodes: Secondary | ICD-10-CM | POA: Diagnosis not present

## 2014-02-20 DIAGNOSIS — R933 Abnormal findings on diagnostic imaging of other parts of digestive tract: Secondary | ICD-10-CM

## 2014-02-20 DIAGNOSIS — J45909 Unspecified asthma, uncomplicated: Secondary | ICD-10-CM | POA: Diagnosis not present

## 2014-02-20 DIAGNOSIS — K5289 Other specified noninfective gastroenteritis and colitis: Secondary | ICD-10-CM | POA: Diagnosis not present

## 2014-02-20 MED ORDER — DEXLANSOPRAZOLE 60 MG PO CPDR: 60.0000 mg | DELAYED_RELEASE_CAPSULE | Freq: Every day | ORAL | Status: DC

## 2014-02-20 MED ORDER — SODIUM CHLORIDE 0.9 % IV SOLN: 500.0000 mL | INTRAVENOUS | Status: DC

## 2014-02-20 NOTE — Progress Notes (Signed)
Report to PACU, RN, vss, BBS= Clear.  

## 2014-02-20 NOTE — Op Note (Signed)
[facility]  [address] [place], [address]   ENDOSCOPY PROCEDURE REPORT  PATIENT: [name], [name]  MR#: [id number] BIRTHDATE: Sep 15, 1930 , 82  yrs. old GENDER: Male ENDOSCOPIST: Milus Banister, MD PROCEDURE DATE:  02/20/2014 PROCEDURE:  EGD w/ biopsy, EGD with balloon dilation ASA CLASS:     Class III INDICATIONS:  Dysphagia: 01/2012 barium esophagram suggested a slight stricture at the GE junction and he will need EGD with dilation for this.  However the esophagram also suggests laryngeal penetration, probable aspiration.  the radiologist suggested formal modi modified barium swallow study with speech therapy.  This was done 02/2012: The MBSS suggest that the main problem is from the esophageal stricture (probably does not also have oropharyngeal swallowing problem).  EGD 02/2012 found and dilated Schatki's ring (up to 39mm).  This helped but only briefly, repeat EGD 07/2012 same finding, dilated to 57mm for 2 minutes.  This very much helped. 2015 dysphagia returned.Marland Kitchen MEDICATIONS: MAC sedation, administered by CRNA and propofol (Diprivan) 100mg  IV TOPICAL ANESTHETIC: none  DESCRIPTION OF PROCEDURE: After the risks benefits and alternatives of the procedure were thoroughly explained, informed consent was obtained.  The Pentax [id number] endoscope was introduced through the mouth and advanced to the second portion of the duodenum. Without limitations.  The instrument was slowly withdrawn as the mucosa was fully examined.    More images were taken than those shown above, we could not save them due to computer issues.  The GE junction was snug and the mucosal along 1/3 of the GE junction was slightly irregular (granular, ? neoplastic).  That location was biopsied and then the GE junction was dilated up to 48mm using CRE TTS balloon held inflated for 1 minute.  There was a medium amount of retained solid food in the stomach.  The examination was  otherwise normal. Retroflexed views revealed no abnormalities.     The scope was then withdrawn from the patient and the procedure completed.  COMPLICATIONS: There were no complications. ENDOSCOPIC IMPRESSION: More images were taken than those shown above, we could not save them due to computer issues.  The GE junction was snug and the mucosal along 1/3 of the GE junction was slightly irregular (granular, ? neoplastic).  That location was biopsied and then the GE junction was dilated up to 26mm using CRE TTS balloon held inflated for 1 minute.  There was a medium amount of retained solid food in the stomach.  The examination was otherwise normal.  RECOMMENDATIONS: New presription for dexilant was called in today.  Await final pathology.  You should chew your food well, eat slowly and take small bites.  eSigned:  Milus Banister, MD 02/20/2014 9:34 AM   CC:  Emi Holes, MD

## 2014-02-20 NOTE — Telephone Encounter (Signed)
dexilant called in

## 2014-02-20 NOTE — Patient Instructions (Signed)
YOU HAD AN ENDOSCOPIC PROCEDURE TODAY AT [facility]: Refer to the procedure report that was given to you for any specific questions about what was found during the examination.  If the procedure report does not answer your questions, please call your gastroenterologist to clarify.  If you requested that your care partner not be given the details of your procedure findings, then the procedure report has been included in a sealed envelope for you to review at your convenience later.  YOU SHOULD EXPECT: Some feelings of bloating in the abdomen. Passage of more gas than usual.  Walking can help get rid of the air that was put into your GI tract during the procedure and reduce the bloating. If you had a lower endoscopy (such as a colonoscopy or flexible sigmoidoscopy) you may notice spotting of blood in your stool or on the toilet paper. If you underwent a bowel prep for your procedure, then you may not have a normal bowel movement for a few days.  DIET: FOLLOW DILATION HANDOUT.Marland Kitchen  ACTIVITY: Your care partner should take you home directly after the procedure.  You should plan to take it easy, moving slowly for the rest of the day.  You can resume normal activity the day after the procedure however you should NOT DRIVE or use heavy machinery for 24 hours (because of the sedation medicines used during the test).    SYMPTOMS TO REPORT IMMEDIATELY: A gastroenterologist can be reached at any hour.  During normal business hours, 8:30 AM to 5:00 PM Monday through Friday, call [phone].  After hours and on weekends, please call the GI answering service at [phone] who will take a message and have the physician on call contact you.    Following upper endoscopy (EGD)  Vomiting of blood or coffee ground material  New chest pain or pain under the shoulder blades  Painful or persistently difficult swallowing  New shortness of breath  Fever of 100F or higher  Black, tarry-looking  stools  FOLLOW UP: If any biopsies were taken you will be contacted by phone or by letter within the next 1-3 weeks.  Call your gastroenterologist if you have not heard about the biopsies in 3 weeks.  Our staff will call the home number listed on your records the next business day following your procedure to check on you and address any questions or concerns that you may have at that time regarding the information given to you following your procedure. This is a courtesy call and so if there is no answer at the home number and we have not heard from you through the emergency physician on call, we will assume that you have returned to your regular daily activities without incident.  Resume medications. Dilation Diet given with discharge instructions.  SIGNATURES/CONFIDENTIALITY: You and/or your care partner have signed paperwork which will be entered into your electronic medical record.  These signatures attest to the fact that that the information above on your After Visit Summary has been reviewed and is understood.  Full responsibility of the confidentiality of this discharge information lies with you and/or your care-partner.

## 2014-02-20 NOTE — Progress Notes (Signed)
Called to room to assist during endoscopic procedure.  Patient ID and intended procedure confirmed with present staff. Received instructions for my participation in the procedure from the performing physician.  

## 2014-02-21 ENCOUNTER — Telehealth: Payer: Self-pay | Admitting: *Deleted

## 2014-02-21 NOTE — Telephone Encounter (Signed)
  Follow up Call-  Call back number 02/20/2014 07/12/2012 03/05/2012  Post procedure Call Back phone  # [phone] or [phone]  Permission to leave phone message Yes Yes Yes     Patient questions:  Do you have a fever, pain , or abdominal swelling? no Pain Score  0 *  Have you tolerated food without any problems? yes  Have you been able to return to your normal activities? yes  Do you have any questions about your discharge instructions: Diet   no Medications  no Follow up visit  no  Do you have questions or concerns about your Care? no  Actions: * If pain score is 4 or above: No action needed, pain <4.

## 2014-02-22 DIAGNOSIS — M999 Biomechanical lesion, unspecified: Secondary | ICD-10-CM | POA: Diagnosis not present

## 2014-02-22 DIAGNOSIS — M5137 Other intervertebral disc degeneration, lumbosacral region: Secondary | ICD-10-CM | POA: Diagnosis not present

## 2014-02-22 DIAGNOSIS — M5430 Sciatica, unspecified side: Secondary | ICD-10-CM | POA: Diagnosis not present

## 2014-02-23 ENCOUNTER — Ambulatory Visit (INDEPENDENT_AMBULATORY_CARE_PROVIDER_SITE_OTHER): Payer: Medicare Other | Admitting: Family Medicine

## 2014-02-23 DIAGNOSIS — I82409 Acute embolism and thrombosis of unspecified deep veins of unspecified lower extremity: Secondary | ICD-10-CM

## 2014-02-23 DIAGNOSIS — Z7901 Long term (current) use of anticoagulants: Secondary | ICD-10-CM | POA: Diagnosis not present

## 2014-02-23 DIAGNOSIS — Z5181 Encounter for therapeutic drug level monitoring: Secondary | ICD-10-CM | POA: Diagnosis not present

## 2014-02-23 LAB — POCT INR: INR: 1.2

## 2014-02-27 ENCOUNTER — Encounter: Payer: Self-pay | Admitting: Gastroenterology

## 2014-02-27 ENCOUNTER — Telehealth: Payer: Self-pay | Admitting: Gastroenterology

## 2014-02-27 MED ORDER — DEXLANSOPRAZOLE 60 MG PO CPDR: 60.0000 mg | DELAYED_RELEASE_CAPSULE | Freq: Every day | ORAL | Status: DC

## 2014-02-27 NOTE — Telephone Encounter (Signed)
New script sent to pharmacy

## 2014-02-28 ENCOUNTER — Telehealth: Payer: Self-pay

## 2014-02-28 ENCOUNTER — Telehealth: Payer: Self-pay | Admitting: Gastroenterology

## 2014-02-28 NOTE — Telephone Encounter (Signed)
The pts wife will call and have the pharmacy send a prior [place]

## 2014-02-28 NOTE — Telephone Encounter (Signed)
Mrs Gloster said [place] is tier 5 with pts insurance and pts wife request tier exception to be sent to ins. Co. Mrs Wyka will ck with Dr [name] office.

## 2014-02-28 NOTE — Telephone Encounter (Signed)
The wife gave a number to call to get a tier exception [phone]

## 2014-03-01 DIAGNOSIS — M999 Biomechanical lesion, unspecified: Secondary | ICD-10-CM | POA: Diagnosis not present

## 2014-03-01 DIAGNOSIS — M5137 Other intervertebral disc degeneration, lumbosacral region: Secondary | ICD-10-CM | POA: Diagnosis not present

## 2014-03-01 DIAGNOSIS — M5430 Sciatica, unspecified side: Secondary | ICD-10-CM | POA: Diagnosis not present

## 2014-03-01 NOTE — Telephone Encounter (Signed)
Tier exception started for Dexilant case # [id number] Insurance ID  [id number]   Approved from 01/30/2014  to 03/01/2015 pt aware

## 2014-03-08 DIAGNOSIS — M5430 Sciatica, unspecified side: Secondary | ICD-10-CM | POA: Diagnosis not present

## 2014-03-08 DIAGNOSIS — M5137 Other intervertebral disc degeneration, lumbosacral region: Secondary | ICD-10-CM | POA: Diagnosis not present

## 2014-03-08 DIAGNOSIS — M999 Biomechanical lesion, unspecified: Secondary | ICD-10-CM | POA: Diagnosis not present

## 2014-03-15 DIAGNOSIS — M5137 Other intervertebral disc degeneration, lumbosacral region: Secondary | ICD-10-CM | POA: Diagnosis not present

## 2014-03-15 DIAGNOSIS — M999 Biomechanical lesion, unspecified: Secondary | ICD-10-CM | POA: Diagnosis not present

## 2014-03-15 DIAGNOSIS — M5430 Sciatica, unspecified side: Secondary | ICD-10-CM | POA: Diagnosis not present

## 2014-03-17 DIAGNOSIS — M659 Synovitis and tenosynovitis, unspecified: Secondary | ICD-10-CM | POA: Diagnosis not present

## 2014-03-27 ENCOUNTER — Ambulatory Visit (INDEPENDENT_AMBULATORY_CARE_PROVIDER_SITE_OTHER): Payer: Medicare Other | Admitting: Nurse Practitioner

## 2014-03-27 ENCOUNTER — Telehealth: Payer: Self-pay | Admitting: *Deleted

## 2014-03-27 ENCOUNTER — Encounter: Payer: Self-pay | Admitting: Nurse Practitioner

## 2014-03-27 VITALS — BP 112/54 | HR 72 | Ht 65.0 in | Wt 171.0 lb

## 2014-03-27 DIAGNOSIS — Z7901 Long term (current) use of anticoagulants: Secondary | ICD-10-CM

## 2014-03-27 DIAGNOSIS — Z5181 Encounter for therapeutic drug level monitoring: Secondary | ICD-10-CM

## 2014-03-27 DIAGNOSIS — Z860100 Personal history of colon polyps, unspecified: Secondary | ICD-10-CM

## 2014-03-27 MED ORDER — MOVIPREP 100 G PO SOLR: 1.0000 | ORAL | Status: DC

## 2014-03-27 NOTE — Patient Instructions (Signed)
You have been scheduled for a colonoscopy. Please follow written instructions given to you at your visit today.  We have given you a sample prep. If you use inhalers (even only as needed), please bring them with you on the day of your procedure. Your physician has requested that you go to [email or web address] and enter the access code given to you at your visit today. This web site gives a general overview about your procedure. However, you should still follow specific instructions given to you by our office regarding your preparation for the procedure.

## 2014-03-27 NOTE — Progress Notes (Signed)
     History of Present Illness:   Patient is an 80 year old male known to Dr. [name]. He has a history of adenomatous colon polyps, chronic fecal incontinence and esophageal strictures. They should have an EGD with dilation in May, his swallowing has significantly improved. Patient is here at now to discuss surveillance colonoscopy for which he is overdue. No bowel changes. No rectal bleeding. No abdominal pain.  Current Medications, Allergies, Past Medical History, Past Surgical History, Family History and Social History were reviewed in [employer] record.   Physical Exam: General: Pleasant, well developed , white male in no acute distress Head: Normocephalic and atraumatic Eyes:  sclerae anicteric, conjunctiva pink  Ears: Normal auditory acuity Lungs: Clear throughout to auscultation Heart: Regular rate and rhythm Abdomen: Soft, non distended, non-tender. No masses, no hepatomegaly. Normal bowel sounds Musculoskeletal: Symmetrical with no gross deformities  Extremities: No edema  Neurological: Alert oriented x 4, grossly nonfocal Psychological:  Alert and cooperative. Normal mood and affect  Assessment and Recommendations:  1. history of adenomatous colon polyps, due for surveillance colonoscopy.The risks, benefits, and alternatives to colonoscopy with possible biopsy and possible polypectomy were discussed with the patient and he consents to proceed.   2. history of pulmonary embolism, on chronic Coumadin. Patient has held Coumadin for previous procedures. Willl contact the prescribing provider about holding Coumadin for colonoscopy.

## 2014-03-27 NOTE — Telephone Encounter (Signed)
RE: [name] DOB: November 27, 1930 MRN: [id number]   Dear [name],    We have scheduled the above patient for an endoscopic procedure. Our records show that he is on anticoagulation therapy.   Please advise as to how long the patient may come off his therapy of Coumadin prior to the procedure, which is scheduled for 04-12-2014.  Please fax back/ or route the completed form to [place] at [phone].   Sincerely,    Tye Savoy NP-C     [place]

## 2014-03-28 ENCOUNTER — Encounter: Payer: Self-pay | Admitting: *Deleted

## 2014-03-28 NOTE — Progress Notes (Signed)
I agree with the plan above 

## 2014-03-28 NOTE — Telephone Encounter (Signed)
Received request re coumadin dosing for upcoming colonoscopy - recommend holding coumadin 5 d prior to procedure then restart the night of the procedure. Recommend sooner INR check given last several have been subtherapeutic. Have routed to [facility]. Lab Results  Component Value Date   INR 1.2 02/23/2014   INR 1.6 01/12/2014   INR 1.9 12/01/2013

## 2014-03-28 NOTE — Telephone Encounter (Signed)
Will discuss with pt at upcoming INR check on 04/03/14.

## 2014-03-29 ENCOUNTER — Other Ambulatory Visit: Payer: Self-pay | Admitting: Family Medicine

## 2014-03-29 DIAGNOSIS — M5137 Other intervertebral disc degeneration, lumbosacral region: Secondary | ICD-10-CM | POA: Diagnosis not present

## 2014-03-29 DIAGNOSIS — M5430 Sciatica, unspecified side: Secondary | ICD-10-CM | POA: Diagnosis not present

## 2014-03-29 DIAGNOSIS — M999 Biomechanical lesion, unspecified: Secondary | ICD-10-CM | POA: Diagnosis not present

## 2014-03-29 NOTE — Telephone Encounter (Signed)
Ok to refill 

## 2014-03-29 NOTE — Telephone Encounter (Signed)
I called and gave the message to Mrs. [name]. Per Dr. Danise Mina, her husband can stop the coumadin on 04-07-2014 and resume the night of 04-12-2014.  She said , that is what he usually does, 5 days off.  She verbalized understanding the instructions.

## 2014-04-03 ENCOUNTER — Ambulatory Visit (INDEPENDENT_AMBULATORY_CARE_PROVIDER_SITE_OTHER): Payer: Medicare Other | Admitting: Family Medicine

## 2014-04-03 ENCOUNTER — Ambulatory Visit: Payer: Medicare Other

## 2014-04-03 DIAGNOSIS — Z5181 Encounter for therapeutic drug level monitoring: Secondary | ICD-10-CM

## 2014-04-03 DIAGNOSIS — I82409 Acute embolism and thrombosis of unspecified deep veins of unspecified lower extremity: Secondary | ICD-10-CM | POA: Diagnosis not present

## 2014-04-03 DIAGNOSIS — Z7901 Long term (current) use of anticoagulants: Secondary | ICD-10-CM

## 2014-04-03 LAB — POCT INR: INR: 2.4

## 2014-04-06 ENCOUNTER — Ambulatory Visit: Payer: Medicare Other

## 2014-04-07 DIAGNOSIS — M5137 Other intervertebral disc degeneration, lumbosacral region: Secondary | ICD-10-CM | POA: Diagnosis not present

## 2014-04-07 DIAGNOSIS — M999 Biomechanical lesion, unspecified: Secondary | ICD-10-CM | POA: Diagnosis not present

## 2014-04-07 DIAGNOSIS — M5430 Sciatica, unspecified side: Secondary | ICD-10-CM | POA: Diagnosis not present

## 2014-04-08 HISTORY — PX: COLONOSCOPY: SHX174

## 2014-04-12 ENCOUNTER — Ambulatory Visit (AMBULATORY_SURGERY_CENTER): Payer: Medicare Other | Admitting: Gastroenterology

## 2014-04-12 ENCOUNTER — Encounter: Payer: Self-pay | Admitting: Gastroenterology

## 2014-04-12 VITALS — BP 116/60 | HR 60 | Temp 96.6°F | Resp 20 | Ht 65.0 in | Wt 171.0 lb

## 2014-04-12 DIAGNOSIS — D126 Benign neoplasm of colon, unspecified: Secondary | ICD-10-CM

## 2014-04-12 DIAGNOSIS — Z86010 Personal history of colonic polyps: Secondary | ICD-10-CM | POA: Diagnosis not present

## 2014-04-12 DIAGNOSIS — G4730 Sleep apnea, unspecified: Secondary | ICD-10-CM | POA: Diagnosis not present

## 2014-04-12 MED ORDER — SODIUM CHLORIDE 0.9 % IV SOLN: 500.0000 mL | INTRAVENOUS | Status: DC

## 2014-04-12 NOTE — Progress Notes (Signed)
A/ox3, pleased with MAC, report to RN 

## 2014-04-12 NOTE — Op Note (Signed)
[facility]  [address] [place], [id number]   COLONOSCOPY PROCEDURE REPORT  PATIENT: [name], [name]  MR#: [id number] BIRTHDATE: [birth year] , 82  yrs. old GENDER: Male ENDOSCOPIST: Milus Banister, MD PROCEDURE DATE:  04/12/2014 PROCEDURE:   Colonoscopy with snare polypectomy First Screening Colonoscopy - Avg.  risk and is 50 yrs.  old or older - No.  Prior Negative Screening - Now for repeat screening. N/A  History of Adenoma - Now for follow-up colonoscopy & has been > or = to 3 yrs.  Yes hx of adenoma.  Has been 3 or more years since last colonoscopy.  Polyps Removed Today? Yes. ASA CLASS:   Class III INDICATIONS:three adenomatous polyps removed 2011. MEDICATIONS: MAC sedation, administered by CRNA and Propofol (Diprivan) 160 mg IV  DESCRIPTION OF PROCEDURE:   After the risks benefits and alternatives of the procedure were thoroughly explained, informed consent was obtained.  A digital rectal exam revealed no abnormalities of the rectum.   The LB [id number]  endoscope was introduced through the anus and advanced to the cecum, which was identified by both the appendix and ileocecal valve. No adverse events experienced.   The quality of the prep was excellent.  The instrument was then slowly withdrawn as the colon was fully examined.  COLON FINDINGS: Three polyps were found, removed and sent to pathology.  These were all sessile, located in transverse segment, 2-61mm across, removed with cold snare.  The examination was otherwise normal.  Retroflexed views revealed no abnormalities. The time to cecum=2 minutes 45 seconds.  Withdrawal time=12 minutes 23 seconds.  The scope was withdrawn and the procedure completed. COMPLICATIONS: There were no complications.  ENDOSCOPIC IMPRESSION: Three polyps were found, removed and sent to pathology.  These were all sessile, located in transverse segment, 2-33mm across, removed with cold snare.  The examination  was otherwise normal.  RECOMMENDATIONS: Given your age, you will likely not need another colonoscopy for colon cancer screening or polyp surveillance.  These types of tests usually stop around the age 33.  You will receive a letter within 1-2 weeks with the results of your biopsy as well as final recommendations.  Please call my office if you have not received a letter after 3 weeks.   eSigned:  Milus Banister, MD 04/12/2014 2:26 PM   cc: Emi Holes, MD

## 2014-04-12 NOTE — Patient Instructions (Signed)
Discharge instructions given with verbal understanding. Handout on polyps. Resume previous medications. YOU HAD AN ENDOSCOPIC PROCEDURE TODAY AT THE [facility]: Refer to the procedure report that was given to you for any specific questions about what was found during the examination.  If the procedure report does not answer your questions, please call your gastroenterologist to clarify.  If you requested that your care partner not be given the details of your procedure findings, then the procedure report has been included in a sealed envelope for you to review at your convenience later.  YOU SHOULD EXPECT: Some feelings of bloating in the abdomen. Passage of more gas than usual.  Walking can help get rid of the air that was put into your GI tract during the procedure and reduce the bloating. If you had a lower endoscopy (such as a colonoscopy or flexible sigmoidoscopy) you may notice spotting of blood in your stool or on the toilet paper. If you underwent a bowel prep for your procedure, then you may not have a normal bowel movement for a few days.  DIET: Your first meal following the procedure should be a light meal and then it is ok to progress to your normal diet.  A half-sandwich or bowl of soup is an example of a good first meal.  Heavy or fried foods are harder to digest and may make you feel nauseous or bloated.  Likewise meals heavy in dairy and vegetables can cause extra gas to form and this can also increase the bloating.  Drink plenty of fluids but you should avoid alcoholic beverages for 24 hours.  ACTIVITY: Your care partner should take you home directly after the procedure.  You should plan to take it easy, moving slowly for the rest of the day.  You can resume normal activity the day after the procedure however you should NOT DRIVE or use heavy machinery for 24 hours (because of the sedation medicines used during the test).    SYMPTOMS TO REPORT IMMEDIATELY: A  gastroenterologist can be reached at any hour.  During normal business hours, 8:30 AM to 5:00 PM Monday through Friday, call [phone].  After hours and on weekends, please call the GI answering service at [phone] who will take a message and have the physician on call contact you.   Following lower endoscopy (colonoscopy or flexible sigmoidoscopy):  Excessive amounts of blood in the stool  Significant tenderness or worsening of abdominal pains  Swelling of the abdomen that is new, acute  Fever of 100F or higher  FOLLOW UP: If any biopsies were taken you will be contacted by phone or by letter within the next 1-3 weeks.  Call your gastroenterologist if you have not heard about the biopsies in 3 weeks.  Our staff will call the home number listed on your records the next business day following your procedure to check on you and address any questions or concerns that you may have at that time regarding the information given to you following your procedure. This is a courtesy call and so if there is no answer at the home number and we have not heard from you through the emergency physician on call, we will assume that you have returned to your regular daily activities without incident.  SIGNATURES/CONFIDENTIALITY: You and/or your care partner have signed paperwork which will be entered into your electronic medical record.  These signatures attest to the fact that that the information above on your After Visit Summary has been   reviewed and is understood.  Full responsibility of the confidentiality of this discharge information lies with you and/or your care-partner. 

## 2014-04-12 NOTE — Progress Notes (Signed)
Called to room to assist during endoscopic procedure.  Patient ID and intended procedure confirmed with present staff. Received instructions for my participation in the procedure from the performing physician.  

## 2014-04-13 ENCOUNTER — Telehealth: Payer: Self-pay | Admitting: *Deleted

## 2014-04-13 NOTE — Telephone Encounter (Signed)
  Follow up Call-  Call back number 04/12/2014 02/20/2014 07/12/2012 03/05/2012  Post procedure Call Back phone  # [phone] or [phone]  Permission to leave phone message Yes Yes Yes Yes     Patient questions:  Do you have a fever, pain , or abdominal swelling? No. Pain Score  0 *  Have you tolerated food without any problems? Yes.    Have you been able to return to your normal activities? Yes.    Do you have any questions about your discharge instructions: Diet   No. Medications  No. Follow up visit  No.  Do you have questions or concerns about your Care? No.  Actions: * If pain score is 4 or above: No action needed, pain <4.

## 2014-04-18 ENCOUNTER — Encounter: Payer: Self-pay | Admitting: Gastroenterology

## 2014-04-18 DIAGNOSIS — M5430 Sciatica, unspecified side: Secondary | ICD-10-CM | POA: Diagnosis not present

## 2014-04-18 DIAGNOSIS — M5137 Other intervertebral disc degeneration, lumbosacral region: Secondary | ICD-10-CM | POA: Diagnosis not present

## 2014-04-18 DIAGNOSIS — M999 Biomechanical lesion, unspecified: Secondary | ICD-10-CM | POA: Diagnosis not present

## 2014-04-20 ENCOUNTER — Ambulatory Visit (INDEPENDENT_AMBULATORY_CARE_PROVIDER_SITE_OTHER): Payer: Medicare Other | Admitting: Family Medicine

## 2014-04-20 DIAGNOSIS — Z5181 Encounter for therapeutic drug level monitoring: Secondary | ICD-10-CM

## 2014-04-20 DIAGNOSIS — Z7901 Long term (current) use of anticoagulants: Secondary | ICD-10-CM | POA: Diagnosis not present

## 2014-04-20 DIAGNOSIS — I82409 Acute embolism and thrombosis of unspecified deep veins of unspecified lower extremity: Secondary | ICD-10-CM | POA: Diagnosis not present

## 2014-04-20 LAB — POCT INR: INR: 1.7

## 2014-05-02 DIAGNOSIS — M5430 Sciatica, unspecified side: Secondary | ICD-10-CM | POA: Diagnosis not present

## 2014-05-02 DIAGNOSIS — M5137 Other intervertebral disc degeneration, lumbosacral region: Secondary | ICD-10-CM | POA: Diagnosis not present

## 2014-05-02 DIAGNOSIS — M999 Biomechanical lesion, unspecified: Secondary | ICD-10-CM | POA: Diagnosis not present

## 2014-05-03 DIAGNOSIS — L578 Other skin changes due to chronic exposure to nonionizing radiation: Secondary | ICD-10-CM | POA: Diagnosis not present

## 2014-05-03 DIAGNOSIS — D239 Other benign neoplasm of skin, unspecified: Secondary | ICD-10-CM | POA: Diagnosis not present

## 2014-05-03 DIAGNOSIS — L570 Actinic keratosis: Secondary | ICD-10-CM | POA: Diagnosis not present

## 2014-05-03 DIAGNOSIS — Z1283 Encounter for screening for malignant neoplasm of skin: Secondary | ICD-10-CM | POA: Diagnosis not present

## 2014-05-03 DIAGNOSIS — D485 Neoplasm of uncertain behavior of skin: Secondary | ICD-10-CM | POA: Diagnosis not present

## 2014-05-09 DIAGNOSIS — Z87448 Personal history of other diseases of urinary system: Secondary | ICD-10-CM

## 2014-05-09 HISTORY — DX: Personal history of other diseases of urinary system: Z87.448

## 2014-05-16 DIAGNOSIS — M5137 Other intervertebral disc degeneration, lumbosacral region: Secondary | ICD-10-CM | POA: Diagnosis not present

## 2014-05-16 DIAGNOSIS — M5430 Sciatica, unspecified side: Secondary | ICD-10-CM | POA: Diagnosis not present

## 2014-05-16 DIAGNOSIS — M999 Biomechanical lesion, unspecified: Secondary | ICD-10-CM | POA: Diagnosis not present

## 2014-05-18 ENCOUNTER — Ambulatory Visit (INDEPENDENT_AMBULATORY_CARE_PROVIDER_SITE_OTHER): Payer: Medicare Other | Admitting: Family Medicine

## 2014-05-18 DIAGNOSIS — I82409 Acute embolism and thrombosis of unspecified deep veins of unspecified lower extremity: Secondary | ICD-10-CM | POA: Diagnosis not present

## 2014-05-18 DIAGNOSIS — Z23 Encounter for immunization: Secondary | ICD-10-CM | POA: Diagnosis not present

## 2014-05-18 DIAGNOSIS — Z7901 Long term (current) use of anticoagulants: Secondary | ICD-10-CM | POA: Diagnosis not present

## 2014-05-18 DIAGNOSIS — Z5181 Encounter for therapeutic drug level monitoring: Secondary | ICD-10-CM | POA: Diagnosis not present

## 2014-05-18 LAB — POCT INR: INR: 2.2

## 2014-05-29 ENCOUNTER — Encounter (HOSPITAL_COMMUNITY): Payer: Self-pay | Admitting: Emergency Medicine

## 2014-05-29 ENCOUNTER — Inpatient Hospital Stay (HOSPITAL_COMMUNITY)
Admission: EM | Admit: 2014-05-29 | Discharge: 2014-06-08 | DRG: 871 | Disposition: A | Discharge status: Skilled Nursing Facility | Payer: Medicare Other | Attending: Internal Medicine | Admitting: Internal Medicine

## 2014-05-29 ENCOUNTER — Telehealth: Payer: Self-pay | Admitting: Family Medicine

## 2014-05-29 ENCOUNTER — Emergency Department (HOSPITAL_COMMUNITY): Payer: Medicare Other

## 2014-05-29 DIAGNOSIS — R269 Unspecified abnormalities of gait and mobility: Secondary | ICD-10-CM

## 2014-05-29 DIAGNOSIS — Z79899 Other long term (current) drug therapy: Secondary | ICD-10-CM | POA: Diagnosis not present

## 2014-05-29 DIAGNOSIS — K219 Gastro-esophageal reflux disease without esophagitis: Secondary | ICD-10-CM | POA: Diagnosis present

## 2014-05-29 DIAGNOSIS — N390 Urinary tract infection, site not specified: Secondary | ICD-10-CM | POA: Diagnosis not present

## 2014-05-29 DIAGNOSIS — F331 Major depressive disorder, recurrent, moderate: Secondary | ICD-10-CM | POA: Diagnosis present

## 2014-05-29 DIAGNOSIS — R1310 Dysphagia, unspecified: Secondary | ICD-10-CM

## 2014-05-29 DIAGNOSIS — Z7901 Long term (current) use of anticoagulants: Secondary | ICD-10-CM | POA: Diagnosis not present

## 2014-05-29 DIAGNOSIS — Z880 Allergy status to penicillin: Secondary | ICD-10-CM | POA: Diagnosis not present

## 2014-05-29 DIAGNOSIS — N183 Chronic kidney disease, stage 3 unspecified: Secondary | ICD-10-CM | POA: Diagnosis not present

## 2014-05-29 DIAGNOSIS — I2510 Atherosclerotic heart disease of native coronary artery without angina pectoris: Secondary | ICD-10-CM | POA: Diagnosis not present

## 2014-05-29 DIAGNOSIS — H9190 Unspecified hearing loss, unspecified ear: Secondary | ICD-10-CM | POA: Diagnosis not present

## 2014-05-29 DIAGNOSIS — M1990 Unspecified osteoarthritis, unspecified site: Secondary | ICD-10-CM | POA: Diagnosis present

## 2014-05-29 DIAGNOSIS — J309 Allergic rhinitis, unspecified: Secondary | ICD-10-CM | POA: Diagnosis not present

## 2014-05-29 DIAGNOSIS — Y92009 Unspecified place in unspecified non-institutional (private) residence as the place of occurrence of the external cause: Secondary | ICD-10-CM

## 2014-05-29 DIAGNOSIS — R6889 Other general symptoms and signs: Secondary | ICD-10-CM | POA: Diagnosis not present

## 2014-05-29 DIAGNOSIS — W19XXXA Unspecified fall, initial encounter: Secondary | ICD-10-CM | POA: Diagnosis present

## 2014-05-29 DIAGNOSIS — I4891 Unspecified atrial fibrillation: Secondary | ICD-10-CM | POA: Diagnosis not present

## 2014-05-29 DIAGNOSIS — A419 Sepsis, unspecified organism: Principal | ICD-10-CM | POA: Diagnosis present

## 2014-05-29 DIAGNOSIS — D6949 Other primary thrombocytopenia: Secondary | ICD-10-CM | POA: Diagnosis present

## 2014-05-29 DIAGNOSIS — D539 Nutritional anemia, unspecified: Secondary | ICD-10-CM | POA: Diagnosis present

## 2014-05-29 DIAGNOSIS — Z7401 Bed confinement status: Secondary | ICD-10-CM

## 2014-05-29 DIAGNOSIS — R339 Retention of urine, unspecified: Secondary | ICD-10-CM | POA: Diagnosis not present

## 2014-05-29 DIAGNOSIS — Z5181 Encounter for therapeutic drug level monitoring: Secondary | ICD-10-CM

## 2014-05-29 DIAGNOSIS — R502 Drug induced fever: Secondary | ICD-10-CM | POA: Diagnosis not present

## 2014-05-29 DIAGNOSIS — Z9849 Cataract extraction status, unspecified eye: Secondary | ICD-10-CM

## 2014-05-29 DIAGNOSIS — D696 Thrombocytopenia, unspecified: Secondary | ICD-10-CM | POA: Diagnosis present

## 2014-05-29 DIAGNOSIS — Z86711 Personal history of pulmonary embolism: Secondary | ICD-10-CM

## 2014-05-29 DIAGNOSIS — F32A Depression, unspecified: Secondary | ICD-10-CM

## 2014-05-29 DIAGNOSIS — H9193 Unspecified hearing loss, bilateral: Secondary | ICD-10-CM | POA: Diagnosis not present

## 2014-05-29 DIAGNOSIS — N281 Cyst of kidney, acquired: Secondary | ICD-10-CM | POA: Diagnosis present

## 2014-05-29 DIAGNOSIS — M549 Dorsalgia, unspecified: Secondary | ICD-10-CM | POA: Diagnosis not present

## 2014-05-29 DIAGNOSIS — N189 Chronic kidney disease, unspecified: Secondary | ICD-10-CM | POA: Diagnosis not present

## 2014-05-29 DIAGNOSIS — N12 Tubulo-interstitial nephritis, not specified as acute or chronic: Secondary | ICD-10-CM | POA: Diagnosis present

## 2014-05-29 DIAGNOSIS — E781 Pure hyperglyceridemia: Secondary | ICD-10-CM | POA: Diagnosis present

## 2014-05-29 DIAGNOSIS — E785 Hyperlipidemia, unspecified: Secondary | ICD-10-CM | POA: Diagnosis present

## 2014-05-29 DIAGNOSIS — K8020 Calculus of gallbladder without cholecystitis without obstruction: Secondary | ICD-10-CM | POA: Diagnosis not present

## 2014-05-29 DIAGNOSIS — M48061 Spinal stenosis, lumbar region without neurogenic claudication: Secondary | ICD-10-CM | POA: Diagnosis not present

## 2014-05-29 DIAGNOSIS — M8580 Other specified disorders of bone density and structure, unspecified site: Secondary | ICD-10-CM | POA: Diagnosis present

## 2014-05-29 DIAGNOSIS — G250 Essential tremor: Secondary | ICD-10-CM | POA: Diagnosis not present

## 2014-05-29 DIAGNOSIS — E538 Deficiency of other specified B group vitamins: Secondary | ICD-10-CM | POA: Diagnosis present

## 2014-05-29 DIAGNOSIS — I5031 Acute diastolic (congestive) heart failure: Secondary | ICD-10-CM | POA: Diagnosis present

## 2014-05-29 DIAGNOSIS — F329 Major depressive disorder, single episode, unspecified: Secondary | ICD-10-CM | POA: Diagnosis present

## 2014-05-29 DIAGNOSIS — M810 Age-related osteoporosis without current pathological fracture: Secondary | ICD-10-CM | POA: Diagnosis not present

## 2014-05-29 DIAGNOSIS — R509 Fever, unspecified: Secondary | ICD-10-CM | POA: Diagnosis not present

## 2014-05-29 DIAGNOSIS — F3289 Other specified depressive episodes: Secondary | ICD-10-CM | POA: Diagnosis not present

## 2014-05-29 DIAGNOSIS — J90 Pleural effusion, not elsewhere classified: Secondary | ICD-10-CM | POA: Diagnosis not present

## 2014-05-29 DIAGNOSIS — J45909 Unspecified asthma, uncomplicated: Secondary | ICD-10-CM | POA: Diagnosis not present

## 2014-05-29 DIAGNOSIS — I359 Nonrheumatic aortic valve disorder, unspecified: Secondary | ICD-10-CM | POA: Diagnosis not present

## 2014-05-29 DIAGNOSIS — J984 Other disorders of lung: Secondary | ICD-10-CM | POA: Diagnosis not present

## 2014-05-29 LAB — URINALYSIS, ROUTINE W REFLEX MICROSCOPIC
BILIRUBIN URINE: NEGATIVE
Glucose, UA: NEGATIVE mg/dL
Ketones, ur: NEGATIVE mg/dL
Nitrite: POSITIVE — AB
PROTEIN: NEGATIVE mg/dL
Specific Gravity, Urine: 1.019 (ref 1.005–1.030)
Urobilinogen, UA: 1 mg/dL (ref 0.0–1.0)
pH: 5.5 (ref 5.0–8.0)

## 2014-05-29 LAB — CBC WITH DIFFERENTIAL/PLATELET
BASOS ABS: 0 10*3/uL (ref 0.0–0.1)
Basophils Relative: 0 % (ref 0–1)
Eosinophils Absolute: 0 10*3/uL (ref 0.0–0.7)
Eosinophils Relative: 0 % (ref 0–5)
HEMATOCRIT: 36.8 % — AB (ref 39.0–52.0)
Hemoglobin: 12.3 g/dL — ABNORMAL LOW (ref 13.0–17.0)
LYMPHS PCT: 4 % — AB (ref 12–46)
Lymphs Abs: 0.4 10*3/uL — ABNORMAL LOW (ref 0.7–4.0)
MCH: 35.7 pg — ABNORMAL HIGH (ref 26.0–34.0)
MCHC: 33.4 g/dL (ref 30.0–36.0)
MCV: 106.7 fL — AB (ref 78.0–100.0)
MONO ABS: 0.2 10*3/uL (ref 0.1–1.0)
MONOS PCT: 2 % — AB (ref 3–12)
Neutro Abs: 9.8 10*3/uL — ABNORMAL HIGH (ref 1.7–7.7)
Neutrophils Relative %: 94 % — ABNORMAL HIGH (ref 43–77)
Platelets: 89 10*3/uL — ABNORMAL LOW (ref 150–400)
RBC: 3.45 MIL/uL — ABNORMAL LOW (ref 4.22–5.81)
RDW: 14.4 % (ref 11.5–15.5)
WBC: 10.4 10*3/uL (ref 4.0–10.5)

## 2014-05-29 LAB — COMPREHENSIVE METABOLIC PANEL
ALBUMIN: 3.5 g/dL (ref 3.5–5.2)
ALK PHOS: 38 U/L — AB (ref 39–117)
ALT: 27 U/L (ref 0–53)
AST: 51 U/L — AB (ref 0–37)
Anion gap: 13 (ref 5–15)
BUN: 30 mg/dL — ABNORMAL HIGH (ref 6–23)
CHLORIDE: 102 meq/L (ref 96–112)
CO2: 23 mEq/L (ref 19–32)
Calcium: 9.7 mg/dL (ref 8.4–10.5)
Creatinine, Ser: 1.83 mg/dL — ABNORMAL HIGH (ref 0.50–1.35)
GFR calc Af Amer: 38 mL/min — ABNORMAL LOW (ref >90)
GFR calc non Af Amer: 33 mL/min — ABNORMAL LOW (ref >90)
Glucose, Bld: 96 mg/dL (ref 70–99)
Potassium: 4.1 mEq/L (ref 3.7–5.3)
Sodium: 138 mEq/L (ref 137–147)
Total Bilirubin: 1.5 mg/dL — ABNORMAL HIGH (ref 0.3–1.2)
Total Protein: 6.5 g/dL (ref 6.0–8.3)

## 2014-05-29 LAB — I-STAT CG4 LACTIC ACID, ED: Lactic Acid, Venous: 3.01 mmol/L — ABNORMAL HIGH (ref 0.5–2.2)

## 2014-05-29 LAB — URINE MICROSCOPIC-ADD ON

## 2014-05-29 LAB — PROTIME-INR
INR: 2.23 — AB (ref 0.00–1.49)
Prothrombin Time: 24.7 seconds — ABNORMAL HIGH (ref 11.6–15.2)

## 2014-05-29 LAB — I-STAT TROPONIN, ED: Troponin i, poc: 0.03 ng/mL (ref 0.00–0.08)

## 2014-05-29 MED ORDER — FENOFIBRATE 160 MG PO TABS
160.0000 mg | ORAL_TABLET | Freq: Every day | ORAL | Status: DC
  Administered 2014-05-30 – 2014-06-08 (×10): 160 mg via ORAL
  Filled 2014-05-29 (×10): qty 1

## 2014-05-29 MED ORDER — DEXTROSE 5 % IV SOLN
1.0000 g | INTRAVENOUS | Status: DC
  Filled 2014-05-29: qty 10

## 2014-05-29 MED ORDER — LORATADINE 10 MG PO TABS
10.0000 mg | ORAL_TABLET | Freq: Every day | ORAL | Status: DC
  Administered 2014-05-30 – 2014-06-08 (×10): 10 mg via ORAL
  Filled 2014-05-29 (×10): qty 1

## 2014-05-29 MED ORDER — ATORVASTATIN CALCIUM 40 MG PO TABS
40.0000 mg | ORAL_TABLET | Freq: Every day | ORAL | Status: DC
  Administered 2014-05-29 – 2014-06-07 (×10): 40 mg via ORAL
  Filled 2014-05-29 (×12): qty 1

## 2014-05-29 MED ORDER — SODIUM CHLORIDE 0.9 % IV SOLN: INTRAVENOUS | Status: DC

## 2014-05-29 MED ORDER — DEXTROSE 5 % IV SOLN: 1.0000 g | INTRAVENOUS | Status: DC

## 2014-05-29 MED ORDER — WARFARIN SODIUM 3 MG PO TABS
3.0000 mg | ORAL_TABLET | Freq: Once | ORAL | Status: AC
  Administered 2014-05-30: 3 mg via ORAL
  Filled 2014-05-29: qty 1

## 2014-05-29 MED ORDER — LEVOFLOXACIN IN D5W 500 MG/100ML IV SOLN: 500.0000 mg | INTRAVENOUS | Status: DC

## 2014-05-29 MED ORDER — PANTOPRAZOLE SODIUM 40 MG PO TBEC
40.0000 mg | DELAYED_RELEASE_TABLET | Freq: Every day | ORAL | Status: DC
  Administered 2014-05-30 – 2014-06-08 (×10): 40 mg via ORAL
  Filled 2014-05-29 (×8): qty 1

## 2014-05-29 MED ORDER — ACETAMINOPHEN 650 MG RE SUPP: 650.0000 mg | Freq: Four times a day (QID) | RECTAL | Status: DC | PRN

## 2014-05-29 MED ORDER — SODIUM CHLORIDE 0.9 % IV SOLN
INTRAVENOUS | Status: DC
  Administered 2014-05-29: 23:00:00 via INTRAVENOUS
  Administered 2014-05-30: 75 mL/h via INTRAVENOUS
  Administered 2014-05-30 – 2014-05-31 (×3): via INTRAVENOUS

## 2014-05-29 MED ORDER — VITAMIN B-12 1000 MCG PO TABS
1000.0000 ug | ORAL_TABLET | Freq: Every day | ORAL | Status: DC
  Administered 2014-05-30 – 2014-06-08 (×10): 1000 ug via ORAL
  Filled 2014-05-29 (×10): qty 1

## 2014-05-29 MED ORDER — ACETAMINOPHEN 325 MG PO TABS
650.0000 mg | ORAL_TABLET | Freq: Four times a day (QID) | ORAL | Status: DC | PRN
  Administered 2014-05-30 – 2014-06-06 (×10): 650 mg via ORAL
  Filled 2014-05-29 (×12): qty 2

## 2014-05-29 MED ORDER — SODIUM CHLORIDE 0.9 % IV BOLUS (SEPSIS)
1000.0000 mL | Freq: Once | INTRAVENOUS | Status: AC
  Administered 2014-05-29: 1000 mL via INTRAVENOUS

## 2014-05-29 MED ORDER — GABAPENTIN 100 MG PO CAPS
100.0000 mg | ORAL_CAPSULE | Freq: Three times a day (TID) | ORAL | Status: DC
  Administered 2014-05-29 – 2014-06-08 (×29): 100 mg via ORAL
  Filled 2014-05-29 (×32): qty 1

## 2014-05-29 MED ORDER — VITAMIN D3 25 MCG (1000 UNIT) PO TABS
1000.0000 [IU] | ORAL_TABLET | Freq: Every day | ORAL | Status: DC
  Administered 2014-05-30 – 2014-06-08 (×10): 1000 [IU] via ORAL
  Filled 2014-05-29 (×10): qty 1

## 2014-05-29 MED ORDER — ALBUTEROL SULFATE (2.5 MG/3ML) 0.083% IN NEBU: 2.5000 mg | INHALATION_SOLUTION | Freq: Four times a day (QID) | RESPIRATORY_TRACT | Status: DC | PRN

## 2014-05-29 MED ORDER — WARFARIN - PHARMACIST DOSING INPATIENT
Freq: Every day | Status: DC
  Administered 2014-06-03: 18:00:00

## 2014-05-29 MED ORDER — OXYBUTYNIN CHLORIDE 5 MG PO TABS
5.0000 mg | ORAL_TABLET | Freq: Two times a day (BID) | ORAL | Status: DC
  Administered 2014-05-29 – 2014-06-08 (×20): 5 mg via ORAL
  Filled 2014-05-29 (×21): qty 1

## 2014-05-29 MED ORDER — ALBUTEROL SULFATE HFA 108 (90 BASE) MCG/ACT IN AERS: 2.0000 | INHALATION_SPRAY | Freq: Four times a day (QID) | RESPIRATORY_TRACT | Status: DC | PRN

## 2014-05-29 MED ORDER — VENLAFAXINE HCL 75 MG PO TABS
75.0000 mg | ORAL_TABLET | Freq: Every day | ORAL | Status: DC
  Administered 2014-05-30 – 2014-06-08 (×10): 75 mg via ORAL
  Filled 2014-05-29 (×10): qty 1

## 2014-05-29 MED ORDER — COQ-10 100 MG PO CAPS: 1.0000 | ORAL_CAPSULE | Freq: Every day | ORAL | Status: DC

## 2014-05-29 MED ORDER — DEXTROSE 5 % IV SOLN
1.0000 g | Freq: Once | INTRAVENOUS | Status: AC
  Administered 2014-05-29: 1 g via INTRAVENOUS
  Filled 2014-05-29: qty 10

## 2014-05-29 MED ORDER — LEVOFLOXACIN IN D5W 500 MG/100ML IV SOLN
500.0000 mg | Freq: Once | INTRAVENOUS | Status: AC
  Administered 2014-05-29: 500 mg via INTRAVENOUS
  Filled 2014-05-29: qty 100

## 2014-05-29 NOTE — Telephone Encounter (Signed)
Pt at ER now  ? ?

## 2014-05-29 NOTE — Telephone Encounter (Signed)
Left vm for pt's wife (who is a Marine scientist).  Informed her that we do not have any open appts today but that she could take pt to UC/ED or schedule appt tomorrow if she thought pt was ok to wait (note said no change in mental status, no bleeding/bruising).  Instructed her to call our office back.

## 2014-05-29 NOTE — ED Notes (Signed)
Pt placed on monitor upon arrival to room. Pt monitored by blood pressure, pulse ox, and 12 lead.

## 2014-05-29 NOTE — ED Notes (Signed)
In and out cath performed by steve rn. Sterile technique used.

## 2014-05-29 NOTE — ED Provider Notes (Signed)
CSN: [id number]     Arrival date & time 05/29/14  1702 History   First MD Initiated Contact with Patient 05/29/14 1709     Chief Complaint  Patient presents with  . Urinary Tract Infection  . Fever     (Consider location/radiation/quality/duration/timing/severity/associated sxs/prior Treatment) Patient is a 80 y.o. male presenting with fever and dysuria.  Fever Max temp prior to arrival:  104.0 Temp source:  Oral Severity:  Moderate Onset quality:  Gradual Duration:  1 day Timing:  Constant Progression:  Unchanged Chronicity:  New Relieved by:  Nothing Worsened by:  Nothing tried Ineffective treatments:  None tried Associated symptoms: dysuria   Associated symptoms: no chest pain and no headaches   Dysuria This is a new problem. The current episode started 12 to 24 hours ago. The problem occurs constantly. The problem has not changed since onset.Pertinent negatives include no chest pain, no abdominal pain, no headaches and no shortness of breath. Nothing aggravates the symptoms. Nothing relieves the symptoms. He has tried nothing for the symptoms.    Past Medical History  Diagnosis Date  . GERD (gastroesophageal reflux disease)     h/o PUD  . CAD (coronary artery disease)     cath 2000 30% single vessel, normal nuclear stress test 12/03/2010, no evidence ischemia  . Renal insufficiency     baseline Cr 1.7  . Depression   . Hx pulmonary embolism 08/1991    coumadin  . History of phlebitis   . Allergic rhinitis   . Urge incontinence   . Elevated PSA     previous-normalized (followed by Dr. Jeffie Pollock, rec no repeat unless urinary sxs)  . HLD (hyperlipidemia)     hypertriglyceridemia  . Asthma     remote  . Arthritis   . Blood transfusion 1990's  . Schatzki's ring 02/2012    s/p dilation [name]  . History of fracture of right hip   . Hearing loss 06/2012    eval - rec annual exam  . Essential tremor 09/27/2008  . Prediabetes   . DISH (diffuse idiopathic skeletal  hyperostosis) 03/2013    lumbar spine on xray  . DDD (degenerative disc disease) 03/2013    by CT, diffuse multilevel cervical and lumbar spondylosis  . Osteoporosis 11/2013    DEX AT score -2.7  . Anemia     no per pt  . Sleep apnea     no CPAP- sleep apnea "cleared up 15 years ago"  . Dizziness   . Cancer     pre- cancerous areas removed from forehead and back   Past Surgical History  Procedure Laterality Date  . Tonsillectomy  1965  . Hernia repair  1979    Left  . Cystoscopy  1992    for kidney stones  . Exploratory laparotomy  1996    with incidental appendectomy  . Appendectomy  1996  . Cardiac catheterization  2000    30% one vessel  . Knee arthroscopy  03/24/02    Right (Dr. Mauri Pole)  . Orif femoral neck fracture w/ dhs  08/03/02    Right (Dr. Mauri Pole)  . Ct abd w & pelvis wo cm  07/2001    Scarring of right lung, stable negative o/w  . Ct abd w & pelvis wo cm  11/2000    ? stones, right LL scarring  . V/q scan  06/1999    negative  . [place]oscopy  1998    [place]  . US echocardiography  12/28/2007  Mild aortic valve calcification EF 55%, basically nml  . Carotid u/s  12/28/2007    nml  . Eeg  11/12/2009    nml  . Mri  11/2009    Head, nml  . Cataract extraction  Jan, March 2011    bilateral  . Colonoscopy  07/2010    5 polyps, adenomatous, rec rpt 3 yrs  . Esophagogastroduodenoscopy  02/2012    dilation of schatzki's ring [name]  . Upper gastrointestinal endoscopy     Family History  Problem Relation Age of Onset  . Stroke Mother   . Hypertension Mother   . Cancer Brother     prostate with mets  . Blindness Brother     legally  . Diabetes Brother   . Pulmonary embolism Sister     from shoulder operation  . Alcohol abuse Brother   . Colon cancer Neg Hx   . Esophageal cancer Neg Hx   . Rectal cancer Neg Hx   . Stomach cancer Neg Hx    History  Substance Use Topics  . Smoking status: Never Smoker   . Smokeless tobacco: Never Used  .  Alcohol Use: No    Review of Systems  Constitutional: Positive for fever.  Respiratory: Negative for shortness of breath.   Cardiovascular: Negative for chest pain.  Gastrointestinal: Negative for abdominal pain.  Genitourinary: Positive for dysuria.  Neurological: Negative for headaches.  All other systems reviewed and are negative.     Allergies  Lactose intolerance (gi); Nizatidine; Penicillins; and Sulfadiazine  Home Medications   Prior to Admission medications   Medication Sig Start Date End Date Taking? Authorizing Provider  albuterol (PROVENTIL HFA;VENTOLIN HFA) 108 (90 BASE) MCG/ACT inhaler Inhale 2 puffs into the lungs every 6 (six) hours as needed for wheezing or shortness of breath.   Yes Historical Provider, MD  atorvastatin (LIPITOR) 40 MG tablet Take 40 mg by mouth at bedtime.   Yes Historical Provider, MD  Cholecalciferol (VITAMIN D) 2000 UNITS CAPS Take 1 capsule by mouth daily.   Yes Historical Provider, MD  Coenzyme Q10 (COQ-10) 100 MG CAPS Take 1 capsule by mouth at bedtime.    Yes Historical Provider, MD  Cyanocobalamin (B-12 PO) Take 1 tablet by mouth every other day.   Yes Historical Provider, MD  dexlansoprazole (DEXILANT) 60 MG capsule Take 60 mg by mouth daily.   Yes Historical Provider, MD  fenofibrate (TRICOR) 145 MG tablet Take 145 mg by mouth daily.   Yes Historical Provider, MD  fexofenadine (ALLEGRA) 180 MG tablet Take 180 mg by mouth every evening.    Yes Historical Provider, MD  gabapentin (NEURONTIN) 100 MG capsule Take 100 mg by mouth 3 (three) times daily.   Yes Historical Provider, MD  meclizine (ANTIVERT) 25 MG tablet Take 25 mg by mouth 3 (three) times daily as needed for dizziness.   Yes Historical Provider, MD  Multiple Vitamins-Minerals (MULTIVITAMIN PO) Take by mouth daily.   Yes Historical Provider, MD  oxybutynin (DITROPAN) 5 MG tablet Take 5 mg by mouth 2 (two) times daily.   Yes Historical Provider, MD  valACYclovir (VALTREX) 1000 MG  tablet Take 1,000 mg by mouth daily as needed (cold sores).   Yes Historical Provider, MD  venlafaxine (EFFEXOR) 75 MG tablet Take 75 mg by mouth daily.   Yes Historical Provider, MD  warfarin (COUMADIN) 3 MG tablet Take 1.5-3 mg by mouth daily. Take 1.5mg  on Tuesday and 3mg  on all other days including Sunday   Yes  Historical Provider, MD   BP 98/40  Pulse 69  Temp(Src) 98.7 F (37.1 C) (Oral)  Resp 14  Ht 5\' 5"  (1.651 m)  Wt 164 lb 7.4 oz (74.6 kg)  BMI 27.37 kg/m2  SpO2 97% Physical Exam  Vitals reviewed. Constitutional: He is oriented to person, place, and time. He appears well-developed and well-nourished.  HENT:  Head: Normocephalic and atraumatic.  Eyes: Conjunctivae and EOM are normal.  Neck: Normal range of motion. Neck supple.  Cardiovascular: Normal rate, regular rhythm and normal heart sounds.   Pulmonary/Chest: Effort normal and breath sounds normal. No respiratory distress.  Abdominal: He exhibits no distension. There is tenderness in the suprapubic area. There is no rebound and no guarding.  Musculoskeletal: Normal range of motion.  Neurological: He is alert and oriented to person, place, and time.  Skin: Skin is warm and dry.    ED Course  Procedures (including critical care time) Labs Review Labs Reviewed  COMPREHENSIVE METABOLIC PANEL - Abnormal; Notable for the following:    BUN 30 (*)    Creatinine, Ser 1.83 (*)    AST 51 (*)    Alkaline Phosphatase 38 (*)    Total Bilirubin 1.5 (*)    GFR calc non Af Amer 33 (*)    GFR calc Af Amer 38 (*)    All other components within normal limits  CBC WITH DIFFERENTIAL - Abnormal; Notable for the following:    RBC 3.45 (*)    Hemoglobin 12.3 (*)    HCT 36.8 (*)    MCV 106.7 (*)    MCH 35.7 (*)    Platelets 89 (*)    Neutrophils Relative % 94 (*)    Neutro Abs 9.8 (*)    Lymphocytes Relative 4 (*)    Lymphs Abs 0.4 (*)    Monocytes Relative 2 (*)    All other components within normal limits  URINALYSIS,  ROUTINE W REFLEX MICROSCOPIC - Abnormal; Notable for the following:    Color, Urine AMBER (*)    APPearance CLOUDY (*)    Hgb urine dipstick SMALL (*)    Nitrite POSITIVE (*)    Leukocytes, UA MODERATE (*)    All other components within normal limits  URINE MICROSCOPIC-ADD ON - Abnormal; Notable for the following:    Bacteria, UA MANY (*)    All other components within normal limits  PROTIME-INR - Abnormal; Notable for the following:    Prothrombin Time 24.7 (*)    INR 2.23 (*)    All other components within normal limits  I-STAT CG4 LACTIC ACID, ED - Abnormal; Notable for the following:    Lactic Acid, Venous 3.01 (*)    All other components within normal limits  CULTURE, BLOOD (ROUTINE X 2)  CULTURE, BLOOD (ROUTINE X 2)  URINE CULTURE  LACTIC ACID, PLASMA  CBC  BASIC METABOLIC PANEL  PROTIME-INR  I-STAT TROPOININ, ED    Imaging Review Dg Chest 2 View  05/29/2014   CLINICAL DATA:  Fever, UTI  EXAM: CHEST  2 VIEW  COMPARISON:  03/21/2013  FINDINGS: Lungs are essentially clear. No focal consolidation. No pleural effusion or pneumothorax.  The heart is normal in size.  Calcified right perihilar nodes.  Degenerative changes of the visualized thoracolumbar spine.  IMPRESSION: No evidence of acute cardiopulmonary disease.   Electronically Signed   By: Julian Hy M.D.   On: 05/29/2014 19:18     EKG Interpretation None      MDM   Final  diagnoses:  Pyelonephritis  CKD (chronic kidney disease) stage 3, GFR 30-59 ml/min  Coronary atherosclerosis of unspecified type of vessel, native or graft  Depression  Essential tremor  Sepsis, due to unspecified organism    80 y.o. male with pertinent PMH of prior PE on coumadin, CAD presents with suprapubic abd pain, dysuria, fever to 104 x 1 day.  Pt is reportedly always hypotensive to a mild degree.  Physical exam demonstrated suprapubic tenderness.  UA with signs of infection.  Clinical course suggestive of pyelonephritis.   Rocephin given.  Consulted hospitalist for admission.  1. Pyelonephritis   2. CKD (chronic kidney disease) stage 3, GFR 30-59 ml/min   3. Coronary atherosclerosis of unspecified type of vessel, native or graft   4. Depression   5. Essential tremor   6. Sepsis, due to unspecified organism         Debby Freiberg, MD 05/30/14 [phone]

## 2014-05-29 NOTE — ED Notes (Signed)
Lactic acid of 3.01 critical reported to MD.

## 2014-05-29 NOTE — Progress Notes (Signed)
Pt admitted to the unit. Pt is alert and oriented. Pt oriented to room, staff, and call bell. Bed in lowest position. Full assessment to Epic. Call bell with in reach. Told to call for assists. Will continue to monitor.  [name] E  

## 2014-05-29 NOTE — Telephone Encounter (Signed)
Pt's wife called back again.  She said that pt was unable to walk or follow commands.  I advised her to call 911 right away and she agreed.

## 2014-05-29 NOTE — ED Notes (Signed)
NOTIFIED REBOLD,RN ON POD A OF PATIENTS LAB R3ESULTS OF CG4+ LACTIC ACID .@17 :45 PM ,05/29/2014.

## 2014-05-29 NOTE — H&P (Signed)
[facility] History and Physical  [name] CMK:[id number] DOB: 12-21-1930 DOA: 05/29/2014  Referring physician: ED physician PCP: [name] Bush, MD  Specialists:   Chief Complaint: Urinary incontinence, urinary urgency and burning on urination.  HPI: [name] is a 80 y.o. male  past medical history of CKD-III, CAD, essential tremor, thrombocytopenia,  who presents with urinary incontinence and urgency, and burning on urination.  Patient has been doing well until this morning at about 1 AM, when he had urinary incontinence. When patient got up and went to the bath room in the early morning at about 1:00 AM, he had urinary incontinence and wetted himself. He also has urinary urgency and burning on urination. He had fever with a temperature up to 104.0. He felt generally weak, and fell on the ground. Per his wife, patient did not have paraplegia, did not have weakness or numbness specifically on one side of his body or on any extremities (patient's wife was NP before retired. She is very sure that patient did not have signs of stroke or TIA). Patient had a short lasting left hand tingling sensation which resolved when I saw him in ED. Patient does not have chest pain, shortness of breath, abdominal pain, nausea, vomiting or rashes. Per patient's wife, his blood pressure normally runs low, the systolic pressure is about ~90 mmHg at home normally.   Patient was found to have positive urinalysis for UTI. Lactic acid 3.01. Blood pressure is running low, slightly higher than SBP 90. IV Rocephin was started in ED. Patient is admitted to inpatient.   Review of Systems: As presented in the history of presenting illness, rest negative.  Where does patient live?  Lives with his wife at home Can patient participate in ADLs? Yes  Allergy:  Allergies  Allergen Reactions  . Lactose Intolerance (Gi)   . Nizatidine     REACTION: Rash (Axid)  . Penicillins     REACTION: Rash  .  Sulfadiazine     REACTION: Hives    Past Medical History  Diagnosis Date  . GERD (gastroesophageal reflux disease)     h/o PUD  . CAD (coronary artery disease)     cath 2000 30% single vessel, normal nuclear stress test 12/03/2010, no evidence ischemia  . Renal insufficiency     baseline Cr 1.7  . Depression   . Hx pulmonary embolism 08/1991    coumadin  . History of phlebitis   . Allergic rhinitis   . Urge incontinence   . Elevated PSA     previous-normalized (followed by Dr. Jeffie [name], rec no repeat unless urinary sxs)  . HLD (hyperlipidemia)     hypertriglyceridemia  . Asthma     remote  . Arthritis   . Blood transfusion 1990's  . Schatzki's ring 02/2012    s/p dilation [name])  . History of fracture of right hip   . Hearing loss 06/2012    eval - rec annual exam  . Essential tremor 09/27/2008  . Prediabetes   . DISH (diffuse idiopathic skeletal hyperostosis) 03/2013    lumbar spine on xray  . DDD (degenerative disc disease) 03/2013    by CT, diffuse multilevel cervical and lumbar spondylosis  . Osteoporosis 11/2013    DEX AT score -2.7  . Anemia     no per pt  . Sleep apnea     no CPAP- sleep apnea "cleared up 15 years ago"  . Dizziness   . Cancer     pre-  cancerous areas removed from forehead and back    Past Surgical History  Procedure Laterality Date  . Tonsillectomy  1965  . Hernia repair  1979    Left  . Cystoscopy  1992    for kidney stones  . Exploratory laparotomy  1996    with incidental appendectomy  . Appendectomy  1996  . Cardiac catheterization  2000    30% one vessel  . Knee arthroscopy  03/24/02    Right (Dr. Mauri [name])  . Orif femoral neck fracture w/ dhs  08/03/02    Right (Dr. Mauri [name])  . Ct abd w & pelvis wo cm  07/2001    Scarring of right lung, stable negative o/w  . Ct abd w & pelvis wo cm  11/2000    ? stones, right LL scarring  . V/q scan  06/1999    negative  . [place]oscopy  1998    [place]  . US echocardiography  12/28/2007     Mild aortic valve calcification EF 55%, basically nml  . Carotid u/s  12/28/2007    nml  . Eeg  11/12/2009    nml  . Mri  11/2009    Head, nml  . Cataract extraction  Jan, March 2011    bilateral  . Colonoscopy  07/2010    5 polyps, adenomatous, rec rpt 3 yrs  . Esophagogastroduodenoscopy  02/2012    dilation of schatzki's ring [name])  . Upper gastrointestinal endoscopy      Social History:  reports that he has never smoked. He has never used smokeless tobacco. He reports that he does not drink alcohol or use illicit drugs.  Family History:  Family History  Problem Relation Age of Onset  . Stroke Mother   . Hypertension Mother   . Cancer Brother     prostate with mets  . Blindness Brother     legally  . Diabetes Brother   . Pulmonary embolism Sister     from shoulder operation  . Alcohol abuse Brother   . Colon cancer Neg Hx   . Esophageal cancer Neg Hx   . Rectal cancer Neg Hx   . Stomach cancer Neg Hx      Prior to Admission medications   Medication Sig Start Date End Date Taking? Authorizing Provider  albuterol (PROVENTIL HFA;VENTOLIN HFA) 108 (90 BASE) MCG/ACT inhaler Inhale 2 puffs into the lungs every 6 (six) hours as needed for wheezing or shortness of breath.   Yes Historical Provider, MD  atorvastatin (LIPITOR) 40 MG tablet Take 40 mg by mouth at bedtime.   Yes Historical Provider, MD  Cholecalciferol (VITAMIN D) 2000 UNITS CAPS Take 1 capsule by mouth daily.   Yes Historical Provider, MD  Coenzyme Q10 (COQ-10) 100 MG CAPS Take 1 capsule by mouth at bedtime.    Yes Historical Provider, MD  Cyanocobalamin (B-12 PO) Take 1 tablet by mouth every other day.   Yes Historical Provider, MD  dexlansoprazole (DEXILANT) 60 MG capsule Take 60 mg by mouth daily.   Yes Historical Provider, MD  fenofibrate (TRICOR) 145 MG tablet Take 145 mg by mouth daily.   Yes Historical Provider, MD  fexofenadine (ALLEGRA) 180 MG tablet Take 180 mg by mouth every evening.    Yes  Historical Provider, MD  gabapentin (NEURONTIN) 100 MG capsule Take 100 mg by mouth 3 (three) times daily.   Yes Historical Provider, MD  meclizine (ANTIVERT) 25 MG tablet Take 25 mg by mouth 3 (three) times daily  as needed for dizziness.   Yes Historical Provider, MD  Multiple Vitamins-Minerals (MULTIVITAMIN PO) Take by mouth daily.   Yes Historical Provider, MD  oxybutynin (DITROPAN) 5 MG tablet Take 5 mg by mouth 2 (two) times daily.   Yes Historical Provider, MD  valACYclovir (VALTREX) 1000 MG tablet Take 1,000 mg by mouth daily as needed (cold sores).   Yes Historical Provider, MD  venlafaxine (EFFEXOR) 75 MG tablet Take 75 mg by mouth daily.   Yes Historical Provider, MD  warfarin (COUMADIN) 3 MG tablet Take 1.5-3 mg by mouth daily. Take 1.5mg  on Tuesday and 3mg  on all other days including Sunday   Yes Historical Provider, MD    Physical Exam: Filed Vitals:   05/29/14 1815 05/29/14 1830 05/29/14 1845 05/29/14 1850  BP: 90/44 99/36 104/39 94/40  Pulse: 84 92 83   Temp:      TempSrc:      Resp:    16  Height:      Weight:      SpO2: 98% 96% 98% 98%   General: Not in acute distress HEENT:       Eyes: PERRL, EOMI, no scleral icterus       ENT: No discharge from the ears and nose, no pharynx injection, no tonsillar enlargement.        Neck: No JVD, no bruit, no mass felt. Cardiac: S1/S2, RRR, No murmurs, gallops or rubs Pulm: Good air movement bilaterally. Clear to auscultation bilaterally. No rales, wheezing, rhonchi or rubs. Abd: Soft, nondistended, nontender, no rebound pain, no organomegaly, BS present. Mild tenderness over the R CVA area.  Ext: No edema. 2+DP/PT pulse bilaterally Musculoskeletal: No joint deformities, erythema, or stiffness, ROM full Skin: No rashes.  Neuro: Alert and oriented X3, cranial nerves II-XII grossly intact, muscle strength 5/5 in all extremeties, sensation to light touch intact.  Psych: Patient is not psychotic, no suicidal or hemocidal  ideation.  Labs on Admission:  Basic Metabolic Panel:  Recent Labs Lab 05/29/14 1705  NA 138  K 4.1  CL 102  CO2 23  GLUCOSE 96  BUN 30*  CREATININE 1.83*  CALCIUM 9.7   Liver Function Tests:  Recent Labs Lab 05/29/14 1705  AST 51*  ALT 27  ALKPHOS 38*  BILITOT 1.5*  PROT 6.5  ALBUMIN 3.5   No results found for this basename: LIPASE, AMYLASE,  in the last 168 hours No results found for this basename: AMMONIA,  in the last 168 hours CBC:  Recent Labs Lab 05/29/14 1705  WBC 10.4  NEUTROABS 9.8*  HGB 12.3*  HCT 36.8*  MCV 106.7*  PLT 89*   Cardiac Enzymes: No results found for this basename: CKTOTAL, CKMB, CKMBINDEX, TROPONINI,  in the last 168 hours  BNP (last 3 results) No results found for this basename: PROBNP,  in the last 8760 hours CBG: No results found for this basename: GLUCAP,  in the last 168 hours  Radiological Exams on Admission: Dg Chest 2 View  05/29/2014   CLINICAL DATA:  Fever, UTI  EXAM: CHEST  2 VIEW  COMPARISON:  03/21/2013  FINDINGS: Lungs are essentially clear. No focal consolidation. No pleural effusion or pneumothorax.  The heart is normal in size.  Calcified right perihilar nodes.  Degenerative changes of the visualized thoracolumbar spine.  IMPRESSION: No evidence of acute cardiopulmonary disease.   Electronically Signed   By: Julian Hy M.D.   On: 05/29/2014 19:18   Assessment/Plan Principal Problem:   Pyelonephritis Active Problems:  VITAMIN B12 DEFICIENCY   THROMBOCYTOPENIA, PRIMARY NOS   CORONARY ARTERY DISEASE   CKD (chronic kidney disease) stage 3, GFR 30-59 ml/min   HLD (hyperlipidemia)   Depression   Sepsis  1. pyelonephritis and sepsis:   Patient has R CVA tenderness, urinary incontinence, burning and urgency, positive UTI on UA, all are consistent with the diagnosis of acute pyelonephritis.  Pt meets criteria for sepsis 2/2 pyelonephritis. He is currently hemodynamically stable and fully alert and oriented.  Received 2 L normal saline in ED. IV Rocephin was started in ED, blood culture and urine culture were drawn before IV antibiotics.  - Admit to med-surg - IVF: NS @ 130mL/hr, will bolus if needed - Patient is allergic to penicillin. Rocephin was started in ED, will switch to levaquin - Follow up results of urine and blood cx and amend antibiotic regimen if needed per sensitivity results  2. CAD: Stable, no chest pain  - will continue home lipid management regimen.  3. Depression: Stable no suicidal or homicidal ideation. -Continue home medications.  4. history of pulmonary embolism: Currently on Coumadin. Recent IRN 2.2 on 05/18/14. -Continue Coumadin per pharmacy. - check INR.  5. CKD-III: Baseline creatinine 1.7-1.8. On admission, patient's creatinine is 1.83, which is at his baseline. -Monitor renal function by checking a BMP   6. chronic thrombocytopenia: Platelet decreased from previous 146 on 11/15/50 to 89 on admission. No bleeding tendency. It is most likely due to acute infection. - will monitor by CBC - no heparin or Lovenox for DVT prophylaxis   DVT ppx: SCD and coumadin  Code Status: Full code Family Communication: Yes, patient's  wife at bed side Disposition Plan: Admit to inpatient  Ivor Costa [facility] Pager [phone]  If 7PM-7AM, please contact night-coverage [email or web address] Password [facility] 05/29/2014, 9:45 PM

## 2014-05-29 NOTE — ED Notes (Signed)
Attempted report 

## 2014-05-29 NOTE — Telephone Encounter (Signed)
Patient Information:  Caller Name: [name]  Phone: [phone]  Patient: [name], [name]  Gender: Male  DOB: 1930-09-13  Age: 80 Years  PCP: Ria Bush [facility])  Office Follow Up:  Does the office need to follow up with this patient?: Yes  Instructions For The Office: Needs visit.  Patient is 10 minutes away.  RN Note:  Wife calling regarding Patient/Reon c/o fever, [place] last PM while going to bathroom.  Denies change in mental status.  NO Bleeding or brusing.  Symptoms  Reason For Call & Symptoms: fever, chills,  Reviewed Health History In EMR: Yes  Reviewed Medications In EMR: Yes  Reviewed Allergies In EMR: Yes  Reviewed Surgeries / Procedures: Yes  Date of Onset of Symptoms: 05/29/2014  Any Fever: Yes  Fever Taken: Oral  Fever Time Of Reading: 13:30:00  Fever Last Reading: 101.4  Guideline(s) Used:  Fever  Disposition Per Guideline:   See Today in Office  Reason For Disposition Reached:   Patient wants to be seen  Advice Given:  N/A  Patient Will Follow Care Advice:  YES

## 2014-05-29 NOTE — Telephone Encounter (Signed)
Pt has fever of 102.4 and chills.  His wife thinks he may have pyelonephritis or prostatitis, nothing related to fall.  She agreed to take pt to [facility] UC now.

## 2014-05-29 NOTE — ED Notes (Signed)
Per EMS- pt woke up at 0200 with burning urination and fever. At 1300 today began having shaking chills, fever was 101.4, then it was 104 at 1530. Pt denies chest pain or shortness of breathe. Pt is weak and fell. Pt arms are shaking, this is not normal for the patient. He normally walks with a cane.

## 2014-05-30 DIAGNOSIS — N12 Tubulo-interstitial nephritis, not specified as acute or chronic: Secondary | ICD-10-CM

## 2014-05-30 LAB — CBC
HEMATOCRIT: 31.7 % — AB (ref 39.0–52.0)
Hemoglobin: 10.7 g/dL — ABNORMAL LOW (ref 13.0–17.0)
MCH: 36.4 pg — ABNORMAL HIGH (ref 26.0–34.0)
MCHC: 33.8 g/dL (ref 30.0–36.0)
MCV: 107.8 fL — ABNORMAL HIGH (ref 78.0–100.0)
Platelets: 66 10*3/uL — ABNORMAL LOW (ref 150–400)
RBC: 2.94 MIL/uL — ABNORMAL LOW (ref 4.22–5.81)
RDW: 14.8 % (ref 11.5–15.5)
WBC: 10.4 10*3/uL (ref 4.0–10.5)

## 2014-05-30 LAB — BASIC METABOLIC PANEL
Anion gap: 9 (ref 5–15)
BUN: 30 mg/dL — AB (ref 6–23)
CO2: 22 mEq/L (ref 19–32)
Calcium: 8.1 mg/dL — ABNORMAL LOW (ref 8.4–10.5)
Chloride: 109 mEq/L (ref 96–112)
Creatinine, Ser: 1.73 mg/dL — ABNORMAL HIGH (ref 0.50–1.35)
GFR calc Af Amer: 41 mL/min — ABNORMAL LOW (ref >90)
GFR, EST NON AFRICAN AMERICAN: 36 mL/min — AB (ref >90)
Glucose, Bld: 90 mg/dL (ref 70–99)
POTASSIUM: 4.3 meq/L (ref 3.7–5.3)
Sodium: 140 mEq/L (ref 137–147)

## 2014-05-30 LAB — PROTIME-INR
INR: 2.31 — ABNORMAL HIGH (ref 0.00–1.49)
PROTHROMBIN TIME: 25.4 s — AB (ref 11.6–15.2)

## 2014-05-30 LAB — LACTIC ACID, PLASMA: Lactic Acid, Venous: 1.9 mmol/L (ref 0.5–2.2)

## 2014-05-30 MED ORDER — WARFARIN SODIUM 1 MG PO TABS
1.5000 mg | ORAL_TABLET | Freq: Once | ORAL | Status: AC
  Administered 2014-05-30: 1.5 mg via ORAL
  Filled 2014-05-30: qty 1

## 2014-05-30 MED ORDER — SODIUM CHLORIDE 0.9 % IV SOLN
250.0000 mg | Freq: Four times a day (QID) | INTRAVENOUS | Status: DC
  Administered 2014-05-30 – 2014-06-01 (×7): 250 mg via INTRAVENOUS
  Filled 2014-05-30 (×9): qty 250

## 2014-05-30 NOTE — Progress Notes (Signed)
UR complete. Raechel Marcos RN CCM Case Mgmt  

## 2014-05-30 NOTE — Progress Notes (Addendum)
ANTICOAGULATION CONSULT NOTE - Follow Up Consult  Pharmacy Consult for coumadin Indication: hx PE  Allergies  Allergen Reactions  . Lactose Intolerance (Gi)   . Nizatidine     REACTION: Rash (Axid)  . Penicillins     REACTION: Rash  . Sulfadiazine     REACTION: Hives    Patient Measurements: Height: 5\' 5"  (165.1 cm) Weight: 165 lb 5.5 oz (75 kg) IBW/kg (Calculated) : 61.5   Vital Signs: Temp: 98.4 F (36.9 C) (09/22 0529) Temp src: Oral (09/22 0529) BP: 105/45 mmHg (09/22 0529) Pulse Rate: 64 (09/22 0529)  Labs:  Recent Labs  05/29/14 1705 05/29/14 2245  HGB 12.3*  --   HCT 36.8*  --   PLT 89*  --   LABPROT  --  24.7*  INR  --  2.23*  CREATININE 1.83*  --     Estimated Creatinine Clearance: 30.5 ml/min (by C-G formula based on Cr of 1.83).  Assessment: Patient is an 80 y.o M on coumadin PTA for hx PE.  INR is therapeutic at 2.31 today.  Hgb and plt down to 10.7 and 66, respectively.  No bleeding documented.   Goal of Therapy:  INR 2-3    Plan:  1) coumadin 1.5mg  PO x1 today --- will watch INR closely as patient is also on levaquin 2) monitor cbc and s/s of bleeding  Inioluwa Baris P 05/30/2014,8:52 AM  [place] (1:52 PM) Patient remains febrile while on Rocephin.  With PCN allergy, changing abx to primaxin.  Will start primaxin 250mg  IV q6h.    Dia Sitter, PharmD, BCPS

## 2014-05-30 NOTE — Progress Notes (Signed)
ANTICOAGULATION CONSULT NOTE - Initial Consult  Pharmacy Consult for Coumadin Indication: h/o PE  Allergies  Allergen Reactions  . Lactose Intolerance (Gi)   . Nizatidine     REACTION: Rash (Axid)  . Penicillins     REACTION: Rash  . Sulfadiazine     REACTION: Hives    Patient Measurements: Height: 5\' 5"  (165.1 cm) Weight: 164 lb 7.4 oz (74.6 kg) IBW/kg (Calculated) : 61.5  Vital Signs: Temp: 98.7 F (37.1 C) (09/21 2239) Temp src: Oral (09/21 2239) BP: 98/40 mmHg (09/21 2239) Pulse Rate: 69 (09/21 2239)  Labs:  Recent Labs  05/29/14 1705 05/29/14 2245  HGB 12.3*  --   HCT 36.8*  --   PLT 89*  --   LABPROT  --  24.7*  INR  --  2.23*  CREATININE 1.83*  --     Estimated Creatinine Clearance: 30.4 ml/min (by C-G formula based on Cr of 1.83).   Medical History: Past Medical History  Diagnosis Date  . GERD (gastroesophageal reflux disease)     h/o PUD  . CAD (coronary artery disease)     cath 2000 30% single vessel, normal nuclear stress test 12/03/2010, no evidence ischemia  . Renal insufficiency     baseline Cr 1.7  . Depression   . Hx pulmonary embolism 08/1991    coumadin  . History of phlebitis   . Allergic rhinitis   . Urge incontinence   . Elevated PSA     previous-normalized (followed by Dr. Jeffie [name], rec no repeat unless urinary sxs)  . HLD (hyperlipidemia)     hypertriglyceridemia  . Asthma     remote  . Arthritis   . Blood transfusion 1990's  . Schatzki's ring 02/2012    s/p dilation [name]  . History of fracture of right hip   . Hearing loss 06/2012    eval - rec annual exam  . Essential tremor 09/27/2008  . Prediabetes   . DISH (diffuse idiopathic skeletal hyperostosis) 03/2013    lumbar spine on xray  . DDD (degenerative disc disease) 03/2013    by CT, diffuse multilevel cervical and lumbar spondylosis  . Osteoporosis 11/2013    DEX AT score -2.7  . Anemia     no per pt  . Sleep apnea     no CPAP- sleep apnea "cleared up 15 years  ago"  . Dizziness   . Cancer     pre- cancerous areas removed from forehead and back    Medications:  Prescriptions prior to admission  Medication Sig Dispense Refill  . albuterol (PROVENTIL HFA;VENTOLIN HFA) 108 (90 BASE) MCG/ACT inhaler Inhale 2 puffs into the lungs every 6 (six) hours as needed for wheezing or shortness of breath.      Marland Kitchen atorvastatin (LIPITOR) 40 MG tablet Take 40 mg by mouth at bedtime.      . Cholecalciferol (VITAMIN D) 2000 UNITS CAPS Take 1 capsule by mouth daily.      . Coenzyme Q10 (COQ-10) 100 MG CAPS Take 1 capsule by mouth at bedtime.       . Cyanocobalamin (B-12 PO) Take 1 tablet by mouth every other day.      Marland Kitchen dexlansoprazole (DEXILANT) 60 MG capsule Take 60 mg by mouth daily.      . fenofibrate (TRICOR) 145 MG tablet Take 145 mg by mouth daily.      . fexofenadine (ALLEGRA) 180 MG tablet Take 180 mg by mouth every evening.       Marland Kitchen  gabapentin (NEURONTIN) 100 MG capsule Take 100 mg by mouth 3 (three) times daily.      . meclizine (ANTIVERT) 25 MG tablet Take 25 mg by mouth 3 (three) times daily as needed for dizziness.      . Multiple Vitamins-Minerals (MULTIVITAMIN PO) Take by mouth daily.      Marland Kitchen oxybutynin (DITROPAN) 5 MG tablet Take 5 mg by mouth 2 (two) times daily.      . valACYclovir (VALTREX) 1000 MG tablet Take 1,000 mg by mouth daily as needed (cold sores).      . venlafaxine (EFFEXOR) 75 MG tablet Take 75 mg by mouth daily.      Marland Kitchen warfarin (COUMADIN) 3 MG tablet Take 1.5-3 mg by mouth daily. Take 1.5mg  on Tuesday and 3mg  on all other days including Sunday        Assessment: 80 yo male admitted with UTI, h/o PE, to continue Coumadin  Goal of Therapy:  INR 2-3 Monitor platelets by anticoagulation protocol: Yes   Plan:  Coumadin 3 mg tonight Daily INR  [name], [name] 05/30/2014,12:37 AM

## 2014-05-30 NOTE — Discharge Instructions (Signed)
Information on my medicine - Coumadin   (Warfarin)  This medication education was reviewed with me or my healthcare representative as part of my discharge preparation.    Why was Coumadin prescribed for you? Coumadin was prescribed for you because you have a blood clot or a medical condition that can cause an increased risk of forming blood clots. Blood clots can cause serious health problems by blocking the flow of blood to the heart, lung, or brain. Coumadin can prevent harmful blood clots from forming. As a reminder your indication for Coumadin is:   Select from menuhistory of pulmonary embolism  What test will check on my response to Coumadin? While on Coumadin (warfarin) you will need to have an INR test regularly to ensure that your dose is keeping you in the desired range. The INR (international normalized ratio) number is calculated from the result of the laboratory test called prothrombin time (PT).  If an INR APPOINTMENT HAS NOT ALREADY BEEN MADE FOR YOU please schedule an appointment to have this lab work done by your health care provider within 7 days. Your INR goal is usually a number between:  2 to 3 or your provider may give you a more narrow range like 2-2.5.  Ask your health care provider during an office visit what your goal INR is.  What  do you need to  know  About  COUMADIN? Take Coumadin (warfarin) exactly as prescribed by your healthcare provider about the same time each day.  DO NOT stop taking without talking to the doctor who prescribed the medication.  Stopping without other blood clot prevention medication to take the place of Coumadin may increase your risk of developing a new clot or stroke.  Get refills before you run out.  What do you do if you miss a dose? If you miss a dose, take it as soon as you remember on the same day then continue your regularly scheduled regimen the next day.  Do not take two doses of Coumadin at the same time.  Important Safety  Information A possible side effect of Coumadin (Warfarin) is an increased risk of bleeding. You should call your healthcare provider right away if you experience any of the following:   Bleeding from an injury or your nose that does not stop.   Unusual colored urine (red or dark brown) or unusual colored stools (red or black).   Unusual bruising for unknown reasons.   A serious fall or if you hit your head (even if there is no bleeding).  Some foods or medicines interact with Coumadin (warfarin) and might alter your response to warfarin. To help avoid this:   Eat a balanced diet, maintaining a consistent amount of Vitamin K.   Notify your provider about major diet changes you plan to make.   Avoid alcohol or limit your intake to 1 drink for women and 2 drinks for men per day. (1 drink is 5 oz. wine, 12 oz. beer, or 1.5 oz. liquor.)  Make sure that ANY health care provider who prescribes medication for you knows that you are taking Coumadin (warfarin).  Also make sure the healthcare provider who is monitoring your Coumadin knows when you have started a new medication including herbals and non-prescription products.  Coumadin (Warfarin)  Major Drug Interactions  Increased Warfarin Effect Decreased Warfarin Effect  Alcohol (large quantities) Antibiotics (esp. Septra/Bactrim, Flagyl, Cipro) Amiodarone (Cordarone) Aspirin (ASA) Cimetidine (Tagamet) Megestrol (Megace) NSAIDs (ibuprofen, naproxen, etc.) Piroxicam (Feldene) Propafenone (Rythmol SR) Propranolol (  Inderal) Isoniazid (INH) Posaconazole (Noxafil) Barbiturates (Phenobarbital) Carbamazepine (Tegretol) Chlordiazepoxide (Librium) Cholestyramine (Questran) Griseofulvin Oral Contraceptives Rifampin Sucralfate (Carafate) Vitamin K   Coumadin (Warfarin) Major Herbal Interactions  Increased Warfarin Effect Decreased Warfarin Effect  Garlic Ginseng Ginkgo biloba Coenzyme Q10 Green tea St. Johns wort    Coumadin (Warfarin)  FOOD Interactions  Eat a consistent number of servings per week of foods HIGH in Vitamin K (1 serving =  cup)  Collards (cooked, or boiled & drained) Kale (cooked, or boiled & drained) Mustard greens (cooked, or boiled & drained) Parsley *serving size only =  cup Spinach (cooked, or boiled & drained) Swiss chard (cooked, or boiled & drained) Turnip greens (cooked, or boiled & drained)  Eat a consistent number of servings per week of foods MEDIUM-HIGH in Vitamin K (1 serving = 1 cup)  Asparagus (cooked, or boiled & drained) Broccoli (cooked, boiled & drained, or raw & chopped) Brussel sprouts (cooked, or boiled & drained) *serving size only =  cup Lettuce, raw (green leaf, endive, romaine) Spinach, raw Turnip greens, raw & chopped   These websites have more information on Coumadin (warfarin):  FailFactory.se; VeganReport.com.au;

## 2014-05-30 NOTE — Progress Notes (Addendum)
PROGRESS NOTE  [name] NIO:[id number] DOB: 01-Dec-1930 DOA: 05/29/2014 PCP: Ria Bush, MD  HPI/Subjective:  HPI: [name] is a 80 y.o. male past medical history of CKD-III, CAD, essential tremor, thrombocytopenia who presented to the ED with urinary incontinence and urgency, and burning on urination. In the early morning of 9/21, he fell to the floor due to weakness and needed help to get up. His wife reported a fever of 104 around that time. All symptoms started at that time.   Patient still fatigued although improved from yesterday. Still has urgency and burning of urination. Had a formed BM this morning for first time in 3 days. He couldn't make it to the toilet in time due to his slower and limited gait.    Assessment/Plan:  Pyelonephritis and sepsis:  -Etiology - ? Partial outlet obstruction -Continues to spike fevers of 103+ on 9/22. -patient hemodynamically stable and fully alert -urinary incontinence, burning and urgency, positive UTI on UA  -will change antibiotics to primaxin. - IVF: Decrease from 150 ml/hr to 75.   -Urine and blood culture pending.   CAD and Hyperlipidemia -Stable, with no chest pain  -taking atorvastatin  Depression -Stable no suicidal or homicidal ideation.  -Continue home medications.   Essential tremors -Continue gabapentin  History of pulmonary embolism -coumadin, continue.  Per wife there is a strong family hx of death from blood clots.  The patient coded with his PE in 1990s -INR stable at 2.31 on 05/30/14  CKD-III -Creatinine at 1.73 on 9/22, which is at baseline  -Monitor renal function by checking a BMP   chronic thrombocytopenia -Platelet decreased from from 89 on 9/21 to 66 on 9/22. -No bleeding tendency. It is most likely due to acute infection.  -will monitor by CBC - no heparin or Lovenox for DVT prophylaxis  B12 Deficiency -B12 tablets   DVT Prophylaxis: SCD and coumadin  Code Status: Full Family  Communication: Wife in room. Former Designer, jewellery. Disposition Plan: Will be determined after PT/OT evaluation.   Consultants:  none  Procedures:  none  Antibiotics: Anti-infectives   Start     Dose/Rate Route Frequency Ordered Stop   05/31/14 2200  levofloxacin (LEVAQUIN) IVPB 500 mg  Status:  Discontinued    Comments:  Levaquin 500 mg IV q48h for CrCl < 50 mL/min   500 mg 100 mL/hr over 60 Minutes Intravenous Every 48 hours 05/29/14 2251 05/30/14 1330   05/30/14 1800  cefTRIAXone (ROCEPHIN) 1 g in dextrose 5 % 50 mL IVPB  Status:  Discontinued     1 g 100 mL/hr over 30 Minutes Intravenous Every 24 hours 05/29/14 2232 05/29/14 2249   05/29/14 2359  levofloxacin (LEVAQUIN) IVPB 500 mg     500 mg 100 mL/hr over 60 Minutes Intravenous  Once 05/29/14 2251 05/30/14 0004   05/29/14 2230  cefTRIAXone (ROCEPHIN) 1 g in dextrose 5 % 50 mL IVPB  Status:  Discontinued     1 g 100 mL/hr over 30 Minutes Intravenous Every 24 hours 05/29/14 2228 05/29/14 2231   05/29/14 1730  cefTRIAXone (ROCEPHIN) 1 g in dextrose 5 % 50 mL IVPB     1 g 100 mL/hr over 30 Minutes Intravenous  Once 05/29/14 1729 05/29/14 1819      Objective: Filed Vitals:   05/29/14 2217 05/29/14 2239 05/30/14 0529 05/30/14 1310  BP:  98/40 105/45   Pulse:  69 64   Temp: 99.1 F (37.3 C) 98.7 F (37.1 C) 98.4 F (36.9  C) 102.5 F (39.2 C)  TempSrc: Oral Oral Oral   Resp:  14 20   Height:  5\' 5"  (1.651 m)    Weight:  74.6 kg (164 lb 7.4 oz) 75 kg (165 lb 5.5 oz)   SpO2:  97% 97%     Intake/Output Summary (Last 24 hours) at 05/30/14 1353 Last data filed at 05/30/14 1023  Gross per 24 hour  Intake   2200 ml  Output      0 ml  Net   2200 ml   Filed Weights   05/29/14 1702 05/29/14 2239 05/30/14 0529  Weight: 74.844 kg (165 lb) 74.6 kg (164 lb 7.4 oz) 75 kg (165 lb 5.5 oz)    Exam:  General: No acute distress, patient appears tired but is alert and cooperative Eyes: EOMI, no scleral icterus  ENT: no  pharynx injection, no tonsillar enlargement.  Neck: No JVD, no bruit, no mass felt, no tenderness Cardiac: S1 auscultated, difficulty hearing S2. RRR, No murmurs, gallops or rubs  Pulm: Good air movement bilaterally. Clear to auscultation bilaterally. No rales, wheezing, rhonchi or rubs.  Abd: Soft, nondistended, nontender, no rebound pain, no organomegaly, BS present. No CVA tenderness bilaterally Ext: No edema. 2+DP/PT and radial pulses bilaterally Musculoskeletal: No joint deformities, erythema, or stiffness, ROM full  Skin: warm, moist skin, No rashes, 3 cm bruising on lateral malleolus due to fall yesterday   Neuro: Alert and oriented X3, cranial nerves II-XII grossly intact, muscle strength 5/5 in all extremeties, sensation to light touch intact.  Psych: Patient is not psychotic, no suicidal or hemocidal ideation.   Data Reviewed: Basic Metabolic Panel:  Recent Labs Lab 05/29/14 1705 05/30/14 0752  NA 138 140  K 4.1 4.3  CL 102 109  CO2 23 22  GLUCOSE 96 90  BUN 30* 30*  CREATININE 1.83* 1.73*  CALCIUM 9.7 8.1*   Liver Function Tests:  Recent Labs Lab 05/29/14 1705  AST 51*  ALT 27  ALKPHOS 38*  BILITOT 1.5*  PROT 6.5  ALBUMIN 3.5   CBC:  Recent Labs Lab 05/29/14 1705 05/30/14 0752  WBC 10.4 10.4  NEUTROABS 9.8*  --   HGB 12.3* 10.7*  HCT 36.8* 31.7*  MCV 106.7* 107.8*  PLT 89* 66*     Recent Results (from the past 240 hour(s))  CULTURE, BLOOD (ROUTINE X 2)     Status: None   Collection Time    05/29/14  5:20 PM      Result Value Ref Range Status   Specimen Description BLOOD RIGHT ANTECUBITAL   Final   Special Requests BOTTLES DRAWN AEROBIC ONLY 10CC   Final   Culture  Setup Time     Final   Value: 05/29/2014 22:30     Performed at [facility]   Culture     Final   Value:        BLOOD CULTURE RECEIVED NO GROWTH TO DATE CULTURE WILL BE HELD FOR 5 DAYS BEFORE ISSUING A FINAL NEGATIVE REPORT     Performed at [facility]   Report  Status PENDING   Incomplete  CULTURE, BLOOD (ROUTINE X 2)     Status: None   Collection Time    05/29/14  5:21 PM      Result Value Ref Range Status   Specimen Description BLOOD LEFT ANTECUBITAL   Final   Special Requests BOTTLES DRAWN AEROBIC ONLY 5CC   Final   Culture  Setup Time  Final   Value: 05/29/2014 22:31     Performed at [facility]   Culture     Final   Value:        BLOOD CULTURE RECEIVED NO GROWTH TO DATE CULTURE WILL BE HELD FOR 5 DAYS BEFORE ISSUING A FINAL NEGATIVE REPORT     Performed at [facility]   Report Status PENDING   Incomplete     Studies: Dg Chest 2 View  05/29/2014   CLINICAL DATA:  Fever, UTI  EXAM: CHEST  2 VIEW  COMPARISON:  03/21/2013  FINDINGS: Lungs are essentially clear. No focal consolidation. No pleural effusion or pneumothorax.  The heart is normal in size.  Calcified right perihilar nodes.  Degenerative changes of the visualized thoracolumbar spine.  IMPRESSION: No evidence of acute cardiopulmonary disease.   Electronically Signed   By: Julian Hy M.D.   On: 05/29/2014 19:18    Scheduled Meds: . atorvastatin  40 mg Oral QHS  . cholecalciferol  1,000 Units Oral Daily  . fenofibrate  160 mg Oral Daily  . gabapentin  100 mg Oral TID  . loratadine  10 mg Oral Daily  . oxybutynin  5 mg Oral BID  . pantoprazole  40 mg Oral Daily  . venlafaxine  75 mg Oral Daily  . vitamin B-12  1,000 mcg Oral Daily  . warfarin  1.5 mg Oral ONCE-1800  . Warfarin - Pharmacist Dosing Inpatient   Does not apply q1800   Continuous Infusions: . sodium chloride 75 mL/hr (05/30/14 1308)    Principal Problem:   Pyelonephritis Active Problems:   THROMBOCYTOPENIA, PRIMARY NOS   CORONARY ARTERY DISEASE   CKD (chronic kidney disease) stage 3, GFR 30-59 ml/min   HLD (hyperlipidemia)   Depression   Sepsis    Darcella Gasman, PA-S  Imogene Burn, PA-C [facility] Pager [phone]. If 7PM-7AM, please contact night-coverage at  [email or web address], password [facility] 05/30/2014, 1:53 PM  LOS: 1 day   Patient was seen, examined,treatment plan was discussed with the Physician extender. I have directly reviewed the clinical findings, lab, imaging studies and management of this patient in detail. I have made the necessary changes to the above noted documentation, and agree with the documentation, as recorded by the Physician extender.  Phillips Climes MD [facility].

## 2014-05-31 ENCOUNTER — Encounter (HOSPITAL_COMMUNITY): Payer: Self-pay | Admitting: Student

## 2014-05-31 DIAGNOSIS — H9190 Unspecified hearing loss, unspecified ear: Secondary | ICD-10-CM

## 2014-05-31 DIAGNOSIS — G252 Other specified forms of tremor: Secondary | ICD-10-CM

## 2014-05-31 DIAGNOSIS — N183 Chronic kidney disease, stage 3 unspecified: Secondary | ICD-10-CM

## 2014-05-31 DIAGNOSIS — G250 Essential tremor: Secondary | ICD-10-CM

## 2014-05-31 DIAGNOSIS — A419 Sepsis, unspecified organism: Secondary | ICD-10-CM

## 2014-05-31 LAB — BASIC METABOLIC PANEL
ANION GAP: 11 (ref 5–15)
BUN: 31 mg/dL — ABNORMAL HIGH (ref 6–23)
CALCIUM: 8 mg/dL — AB (ref 8.4–10.5)
CO2: 18 meq/L — AB (ref 19–32)
Chloride: 113 mEq/L — ABNORMAL HIGH (ref 96–112)
Creatinine, Ser: 1.6 mg/dL — ABNORMAL HIGH (ref 0.50–1.35)
GFR calc Af Amer: 45 mL/min — ABNORMAL LOW (ref >90)
GFR calc non Af Amer: 39 mL/min — ABNORMAL LOW (ref >90)
GLUCOSE: 107 mg/dL — AB (ref 70–99)
Potassium: 4.2 mEq/L (ref 3.7–5.3)
Sodium: 142 mEq/L (ref 137–147)

## 2014-05-31 LAB — URINE CULTURE: Colony Count: >=100000

## 2014-05-31 LAB — CBC
HCT: 30 % — ABNORMAL LOW (ref 39.0–52.0)
Hemoglobin: 9.9 g/dL — ABNORMAL LOW (ref 13.0–17.0)
MCH: 35.4 pg — ABNORMAL HIGH (ref 26.0–34.0)
MCHC: 33 g/dL (ref 30.0–36.0)
MCV: 107.1 fL — AB (ref 78.0–100.0)
Platelets: 61 10*3/uL — ABNORMAL LOW (ref 150–400)
RBC: 2.8 MIL/uL — ABNORMAL LOW (ref 4.22–5.81)
RDW: 14.9 % (ref 11.5–15.5)
WBC: 6.1 10*3/uL (ref 4.0–10.5)

## 2014-05-31 LAB — PROTIME-INR
INR: 2.17 — ABNORMAL HIGH (ref 0.00–1.49)
Prothrombin Time: 24.2 seconds — ABNORMAL HIGH (ref 11.6–15.2)

## 2014-05-31 MED ORDER — WARFARIN SODIUM 3 MG PO TABS
3.0000 mg | ORAL_TABLET | ORAL | Status: DC
  Filled 2014-05-31: qty 1

## 2014-05-31 MED ORDER — WARFARIN SODIUM 1 MG PO TABS: 1.5000 mg | ORAL_TABLET | ORAL | Status: DC

## 2014-05-31 MED ORDER — WARFARIN SODIUM 3 MG PO TABS
3.0000 mg | ORAL_TABLET | ORAL | Status: DC
  Administered 2014-05-31: 3 mg via ORAL
  Filled 2014-05-31 (×2): qty 1

## 2014-05-31 NOTE — Care Management Note (Addendum)
    Page 1 of 1   06/08/2014     12:07:28 PM CARE MANAGEMENT NOTE 06/08/2014  Patient:  [name], [name]   Account Number:  [id number]  Date Initiated:  05/30/2014  Documentation initiated by:  [facility]  Subjective/Objective Assessment:   UTI     Action/Plan:   from home  pt eval- rec outpt physical therapy.   Anticipated DC Date:  06/08/2014   Anticipated DC Plan:  SKILLED NURSING FACILITY  In-house referral  Clinical Social Worker      DC Planning Services  CM consult      Choice offered to / List presented to:  C-3 Spouse           Status of service:  Completed, signed off Medicare Important Message given?  YES (If response is "NO", the following Medicare IM given date fields will be blank) Date Medicare IM given:  06/01/2014 Medicare IM given by:  Tomi Bamberger Date Additional Medicare IM given:  06/08/2014 Additional Medicare IM given by:  Tomi Bamberger  Discharge Disposition:  [place]  Per UR Regulation:  Reviewed for med. necessity/level of care/duration of stay  If discussed at [place] of Stay Meetings, dates discussed:   06/08/2014    Comments:  06/08/14 [place], BSN [phone] patient for dc to snf today, CSW following.  06/07/14 [place], BSN [phone] patient is very deconditioned, CSW will speak to patient about snf.  06/01/15 [place], BSN [phone] per physical therpay rec outpt physical therapy , NCM sent referral to [place] therapy , faxed script to [phone].  Also gave patient's wife the script.  They will contact patient to schedule appt.

## 2014-05-31 NOTE — Plan of Care (Signed)
Problem: Phase I Progression Outcomes Goal: Initial discharge plan identified Outcome: Completed/Met Date Met:  05/31/14 To return home with wife

## 2014-05-31 NOTE — Evaluation (Signed)
Physical Therapy Evaluation Patient Details Name: [name] MRN: [id number] DOB: 12-14-1930 Today's Date: 05/31/2014   History of Present Illness  Pt admitted with weakness and fever along with urinary incontinence urgency and frequency.  Pt was difficult to manage by wife in this state and pt fell getting to the bathroom.  evaluation and IV antibiotics in progress.  Clinical Impression  Pt admitted with/for UTI, fever, pyelonephritis.  Pt currently limited functionally due to the problems listed below.  (see problems list.)  Pt will benefit from PT to maximize function and safety to be able to get home safely with available assist of family.      Follow Up Recommendations Outpatient PT    Equipment Recommendations  None recommended by PT    Recommendations for Other Services       Precautions / Restrictions Precautions Precautions: Fall      Mobility  Bed Mobility Overal bed mobility: Needs Assistance Bed Mobility: Sidelying to Sit   Sidelying to sit: Min guard (and rail; couldn't get started without rail)       General bed mobility comments: cues for technique  Transfers Overall transfer level: Needs assistance Equipment used:  (iv pole) Transfers: Sit to/from Stand Sit to Stand: Min guard         General transfer comment: cues for hand placement/safety  Ambulation/Gait Ambulation/Gait assistance: Supervision Ambulation Distance (Feet): 140 Feet Assistive device:  (iv pole) Gait Pattern/deviations: Step-through pattern;Decreased stance time - right;Decreased stride length Gait velocity: moderate speed, per wife much faster than with cane   General Gait Details: mildly unsteady, but generally safe.  Stairs            Wheelchair Mobility    Modified Rankin (Stroke Patients Only)       Balance Overall balance assessment: Needs assistance Sitting-balance support: No upper extremity supported Sitting balance-Leahy Scale: Good      Standing balance support: No upper extremity supported Standing balance-Leahy Scale: Fair Standing balance comment: statically at EOB                             Pertinent Vitals/Pain Pain Assessment: No/denies pain    Home Living Family/patient expects to be discharged to:: Private residence Living Arrangements: Spouse/significant other Available Help at Discharge: Family;Available 24 hours/day Type of Home: House Home Access: Stairs to enter Entrance Stairs-Rails: Doctor, general practice of Steps: several Home Layout: One level Home Equipment: Cane - single point;Walker - 2 wheels Additional Comments: uses cane generally in and outside.    Prior Function Level of Independence: Independent with assistive device(s)               Hand Dominance        Extremity/Trunk Assessment   Upper Extremity Assessment: Overall WFL for tasks assessed (benign tremors and decr muscle endurance.)           Lower Extremity Assessment: Overall WFL for tasks assessed (decr. muscle endurance)         Communication   Communication: No difficulties  Cognition Arousal/Alertness: Awake/alert Behavior During Therapy: WFL for tasks assessed/performed Overall Cognitive Status: Within Functional Limits for tasks assessed                      General Comments General comments (skin integrity, edema, etc.): Had a previous chance to get into a cardiac rehab group for balance and did not stay in the group, because wanted more 1  on 1 time.  BUT needs a structured group like that to push him.    Exercises        Assessment/Plan    PT Assessment Patient needs continued PT services  PT Diagnosis Abnormality of gait;Generalized weakness   PT Problem List Decreased activity tolerance;Decreased balance;Decreased mobility;Decreased coordination;Decreased knowledge of use of DME;Cardiopulmonary status limiting activity  PT Treatment Interventions DME  instruction;Gait training;Functional mobility training;Therapeutic activities;Stair training;Balance training;Patient/family education   PT Goals (Current goals can be found in the Care Plan section) Acute Rehab PT Goals Patient Stated Goal: wife wants him to be more self sufficient and mobile PT Goal Formulation: With patient Time For Goal Achievement: 06/07/14 Potential to Achieve Goals: Good    Frequency Min 3X/week   Barriers to discharge        Co-evaluation               End of Session   Activity Tolerance: Patient tolerated treatment well Patient left: in chair;with call bell/phone within reach;with family/visitor present Nurse Communication: Mobility status         Time: 1610-9604 PT Time Calculation (min): 17 min   Charges:   PT Evaluation $Initial PT Evaluation Tier I: 1 Procedure PT Treatments $Gait Training: 8-22 mins   PT G Codes:          Mayia Megill, Eliseo Gum 05/31/2014, 1:49 PM 05/31/2014  Crane Bing, PT [phone]  (pager)

## 2014-05-31 NOTE — Progress Notes (Signed)
ANTICOAGULATION CONSULT NOTE - Follow Up Consult  Pharmacy Consult for coumadin Indication: hx PE  Allergies  Allergen Reactions  . Lactose Intolerance (Gi)   . Nizatidine     REACTION: Rash (Axid)  . Penicillins     REACTION: Rash  . Sulfadiazine     REACTION: Hives    Patient Measurements: Height: 5\' 5"  (165.1 cm) Weight: 165 lb 5.5 oz (75 kg) IBW/kg (Calculated) : 61.5   Vital Signs: Temp: 98 F (36.7 C) (09/23 0611) Temp src: Oral (09/23 0611) BP: 105/43 mmHg (09/23 0611) Pulse Rate: 62 (09/23 0611)  Labs:  Recent Labs  05/29/14 1705 05/29/14 2245 05/30/14 0752 05/31/14 0500  HGB 12.3*  --  10.7* 9.9*  HCT 36.8*  --  31.7* 30.0*  PLT 89*  --  66* 61*  LABPROT  --  24.7* 25.4* 24.2*  INR  --  2.23* 2.31* 2.17*  CREATININE 1.83*  --  1.73* 1.60*    Estimated Creatinine Clearance: 34.8 ml/min (by C-G formula based on Cr of 1.6).    Assessment: Patient is an 80 y.o M on coumadin for hx PE.  INr remains therapeutic at 2.17 today.  Hgb and plt down slightly to 9.9 and 61, respectively.  No bleeding documented.  Goal of Therapy:  INR 2-3 Monitor platelets by anticoagulation protocol: Yes   Plan:  1) resume home coumadin regimen of 3mg  daily except 1.5mg  on Tuesdays 2) continue primaxin 250mg  IV q6h 3) f/u ucx  Saveah Bahar P 05/31/2014,1:56 PM

## 2014-05-31 NOTE — Progress Notes (Addendum)
PROGRESS NOTE  [name] TDD:[id number] DOB: July 05, 1931 DOA: 05/29/2014 PCP: Ria Bush, MD  HPI/Subjective: HPI: [name] is a 80 y.o. male past medical history of CKD-III, CAD, essential tremor, thrombocytopenia who presented to the ED with urinary incontinence and urgency, and burning on urination. In the early morning of 9/21, he fell to the floor due to weakness and needed help to get up. His wife reported a fever of 104 around that time. All symptoms started at that time.   Had fever yesterday, but feels afebrile today. Appetite still decreased and still quite fatigued; about the same as yesterday. Starting to urinate a little more. Slept a lot yesterday. Still has urgency and burning of urination.    Assessment/Plan:  HPI: [name] is a 80 y.o. male past medical history of CKD-III, CAD, essential tremor, thrombocytopenia who presented to the ED with urinary incontinence and urgency, and burning on urination. In the early morning of 9/21, he fell to the floor due to weakness and needed help to get up. His wife reported a fever of 104 around that time. All symptoms started at that time.   Patient still fatigued although improved from yesterday. Still has urgency and burning of urination. Had a formed BM this morning for first time in 3 days. He couldn't make it to the toilet in time due to his slower and limited gait.   Assessment/Plan:   Pyelonephritis and sepsis:  -Etiology - ? Partial outlet obstruction. -Urine output low but improving. Need strict Is and Os   -Perform bladder scan to check for urinary retention. -Fever of 103 on 9/22. Switched antibiotic to primaxin. Afebrile thus far on 9/23. -patient hemodynamically stable and fully alert  -urinary incontinence, burning and urgency, positive UTI on UA  -IVF at 75 ml/hr -Urine culture shows gram negative rods > 100k colonies. Sensitivities pending.  Blood cultures pending (NGTD)  Abdominal  tenderness -Resolved. Resolved with bowel movement.  CAD and Hyperlipidemia  -Stable, with no chest pain  -taking atorvastatin   Depression  -Stable no suicidal or homicidal ideation.  -Continue home medications.   Essential tremors  -Continue gabapentin   History of pulmonary embolism  -coumadin, continue. Per wife there is a strong family hx of death from blood clots. The patient coded with his PE in 1990s  -INR stable at 2.31 on 05/30/14   CKD-III  -Creatinine at 1.73 on 9/22, which is at baseline  -Monitor renal function by checking a BMP   chronic thrombocytopenia  -Platelet decreased from from 89 on 9/21 to 61 on 9/23.  -No bleeding tendency.Acute on chronic worsened due to acute infection.  - will monitor by CBC  - no heparin or Lovenox for DVT prophylaxis   B12 Deficiency with macrocytic anemia. -B12 tablets   DVT Prophylaxis: SCD and coumadin   Code Status: Full   Family Communication: Wife in room. Former Designer, [employer]ellery.   Disposition Plan: Home health PT recommended.  To home when ready.  Consultants:  none  Procedures:  none   Antibiotics: Anti-infectives   Start     Dose/Rate Route Frequency Ordered Stop   05/31/14 2200  levofloxacin (LEVAQUIN) IVPB 500 mg  Status:  Discontinued    Comments:  Levaquin 500 mg IV q48h for CrCl < 50 mL/min   500 mg 100 mL/hr over 60 Minutes Intravenous Every 48 hours 05/29/14 2251 05/30/14 1330   05/30/14 1800  cefTRIAXone (ROCEPHIN) 1 g in dextrose 5 % 50 mL IVPB  Status:  Discontinued     1 g 100 mL/hr over 30 Minutes Intravenous Every 24 hours 05/29/14 2232 05/29/14 2249   05/30/14 1400  imipenem-cilastatin (PRIMAXIN) 250 mg in sodium chloride 0.9 % 100 mL IVPB     250 mg 200 mL/hr over 30 Minutes Intravenous Every 6 hours 05/30/14 1354     05/29/14 2359  levofloxacin (LEVAQUIN) IVPB 500 mg     500 mg 100 mL/hr over 60 Minutes Intravenous  Once 05/29/14 2251 05/30/14 0004   05/29/14 2230  cefTRIAXone  (ROCEPHIN) 1 g in dextrose 5 % 50 mL IVPB  Status:  Discontinued     1 g 100 mL/hr over 30 Minutes Intravenous Every 24 hours 05/29/14 2228 05/29/14 2231   05/29/14 1730  cefTRIAXone (ROCEPHIN) 1 g in dextrose 5 % 50 mL IVPB     1 g 100 mL/hr over 30 Minutes Intravenous  Once 05/29/14 1729 05/29/14 1819      Objective: Filed Vitals:   05/30/14 1355 05/30/14 2110 05/30/14 2229 05/31/14 0611  BP: 98/33 98/44  105/43  Pulse:  64  62  Temp: 99 F (37.2 C) 99.5 F (37.5 C) 98.6 F (37 C) 98 F (36.7 C)  TempSrc: Oral Oral Oral Oral  Resp: 18 18  20   Height:      Weight:      SpO2: 96% 100%  97%    Intake/Output Summary (Last 24 hours) at 05/31/14 1422 Last data filed at 05/31/14 1418  Gross per 24 hour  Intake    270 ml  Output    600 ml  Net   -330 ml   Filed Weights   05/29/14 1702 05/29/14 2239 05/30/14 0529  Weight: 74.844 kg (165 lb) 74.6 kg (164 lb 7.4 oz) 75 kg (165 lb 5.5 oz)    Exam: General: Well developed, well nourished, NAD, appears stated age  63:  PERR, EOMI, Anicteic Sclera, MMM. No pharyngeal erythema or exudates  Neck: Supple, no JVD, no masses  Cardiovascular: RRR, S1 S2 auscultated, no rubs, murmurs or gallops.   Respiratory: Clear to auscultation bilaterally with equal chest rise  Abdomen: Soft, + bowel sounds, mild tenderness bilaterally in periumbilical region and slightly below and centrally. Mild distension, no suprapubic tenderness, mild CVA tenderness on right side. Extremities: warm dry without cyanosis clubbing or edema.  Neuro: AAOx3, cranial nerves grossly intact. Strength 5/5 in upper and lower extremities  Skin: Without rashes exudates or nodules.   Psych: Normal affect and demeanor with intact judgement and insight   Data Reviewed: Basic Metabolic Panel:  Recent Labs Lab 05/29/14 1705 05/30/14 0752 05/31/14 0500  NA 138 140 142  K 4.1 4.3 4.2  CL 102 109 113*  CO2 23 22 18*  GLUCOSE 96 90 107*  BUN 30* 30* 31*   CREATININE 1.83* 1.73* 1.60*  CALCIUM 9.7 8.1* 8.0*   Liver Function Tests:  Recent Labs Lab 05/29/14 1705  AST 51*  ALT 27  ALKPHOS 38*  BILITOT 1.5*  PROT 6.5  ALBUMIN 3.5   CBC:  Recent Labs Lab 05/29/14 1705 05/30/14 0752 05/31/14 0500  WBC 10.4 10.4 6.1  NEUTROABS 9.8*  --   --   HGB 12.3* 10.7* 9.9*  HCT 36.8* 31.7* 30.0*  MCV 106.7* 107.8* 107.1*  PLT 89* 66* 61*     Recent Results (from the past 240 hour(s))  CULTURE, BLOOD (ROUTINE X 2)     Status: None   Collection Time    05/29/14  5:20 PM      Result Value Ref Range Status   Specimen Description BLOOD RIGHT ANTECUBITAL   Final   Special Requests BOTTLES DRAWN AEROBIC ONLY 10CC   Final   Culture  Setup Time     Final   Value: 05/29/2014 22:30     Performed at [facility]   Culture     Final   Value:        BLOOD CULTURE RECEIVED NO GROWTH TO DATE CULTURE WILL BE HELD FOR 5 DAYS BEFORE ISSUING A FINAL NEGATIVE REPORT     Performed at [facility]   Report Status PENDING   Incomplete  CULTURE, BLOOD (ROUTINE X 2)     Status: None   Collection Time    05/29/14  5:21 PM      Result Value Ref Range Status   Specimen Description BLOOD LEFT ANTECUBITAL   Final   Special Requests BOTTLES DRAWN AEROBIC ONLY 5CC   Final   Culture  Setup Time     Final   Value: 05/29/2014 22:31     Performed at [facility]   Culture     Final   Value:        BLOOD CULTURE RECEIVED NO GROWTH TO DATE CULTURE WILL BE HELD FOR 5 DAYS BEFORE ISSUING A FINAL NEGATIVE REPORT     Performed at [facility]   Report Status PENDING   Incomplete  URINE CULTURE     Status: None   Collection Time    05/29/14  5:53 PM      Result Value Ref Range Status   Specimen Description URINE, CATHETERIZED   Final   Special Requests NONE   Final   Culture  Setup Time     Final   Value: 05/30/2014 01:49     Performed at [place]     Final   Value: >=100,000 COLONIES/ML      Performed at [facility]   Culture     Final   Value: GRAM NEGATIVE RODS     Performed at [facility]   Report Status PENDING   Incomplete     Studies: Dg Chest 2 View  05/29/2014   CLINICAL DATA:  Fever, UTI  EXAM: CHEST  2 VIEW  COMPARISON:  03/21/2013  FINDINGS: Lungs are essentially clear. No focal consolidation. No pleural effusion or pneumothorax.  The heart is normal in size.  Calcified right perihilar nodes.  Degenerative changes of the visualized thoracolumbar spine.  IMPRESSION: No evidence of acute cardiopulmonary disease.   Electronically Signed   By: Julian Hy M.D.   On: 05/29/2014 19:18    Scheduled Meds: . atorvastatin  40 mg Oral QHS  . cholecalciferol  1,000 Units Oral Daily  . fenofibrate  160 mg Oral Daily  . gabapentin  100 mg Oral TID  . imipenem-cilastatin  250 mg Intravenous Q6H  . loratadine  10 mg Oral Daily  . oxybutynin  5 mg Oral BID  . pantoprazole  40 mg Oral Daily  . venlafaxine  75 mg Oral Daily  . vitamin B-12  1,000 mcg Oral Daily  . [START ON 06/06/2014] warfarin  1.5 mg Oral Q Tue-1800  . warfarin  3 mg Oral Once per day on Sun Mon Wed Thu Fri Sat  . Warfarin - Pharmacist Dosing Inpatient   Does not apply q1800   Continuous Infusions: . sodium chloride 75 mL/hr at 05/31/14  0430    Principal Problem:   Pyelonephritis Active Problems:   THROMBOCYTOPENIA, PRIMARY NOS   CORONARY ARTERY DISEASE   CKD (chronic kidney disease) stage 3, GFR 30-59 ml/min   HLD (hyperlipidemia)   Depression   Sepsis    Darcella Gasman, PA-S Imogene Burn, PA-C  [facility] Pager [phone]. If 7PM-7AM, please contact night-coverage at [email or web address], password [facility] 05/31/2014, 2:22 PM  LOS: 2 days   Attending - Patient seen and examined, agree with the above assessment and plan. Admitted with sepsis likely secondary to pyelonephritis. Empirically on Primaxin, await definite urine culture results. Significantly improve, if clinically  improvement continues, suspect home tomorrow morning. INR therapeutic, Coumadin being managed by pharmacy.  Nena Alexander MD

## 2014-06-01 ENCOUNTER — Inpatient Hospital Stay (HOSPITAL_COMMUNITY): Payer: Medicare Other

## 2014-06-01 DIAGNOSIS — I2510 Atherosclerotic heart disease of native coronary artery without angina pectoris: Secondary | ICD-10-CM

## 2014-06-01 LAB — CBC
HEMATOCRIT: 31.5 % — AB (ref 39.0–52.0)
Hemoglobin: 10.4 g/dL — ABNORMAL LOW (ref 13.0–17.0)
MCH: 36.2 pg — ABNORMAL HIGH (ref 26.0–34.0)
MCHC: 33 g/dL (ref 30.0–36.0)
MCV: 109.8 fL — ABNORMAL HIGH (ref 78.0–100.0)
Platelets: 67 10*3/uL — ABNORMAL LOW (ref 150–400)
RBC: 2.87 MIL/uL — ABNORMAL LOW (ref 4.22–5.81)
RDW: 14.8 % (ref 11.5–15.5)
WBC: 4.7 10*3/uL (ref 4.0–10.5)

## 2014-06-01 LAB — PROTIME-INR
INR: 1.88 — ABNORMAL HIGH (ref 0.00–1.49)
Prothrombin Time: 21.6 seconds — ABNORMAL HIGH (ref 11.6–15.2)

## 2014-06-01 MED ORDER — DEXTROSE 5 % IV SOLN
2.0000 g | Freq: Every day | INTRAVENOUS | Status: DC
  Administered 2014-06-01 – 2014-06-04 (×4): 2 g via INTRAVENOUS
  Filled 2014-06-01 (×4): qty 2

## 2014-06-01 MED ORDER — WARFARIN SODIUM 4 MG PO TABS
4.0000 mg | ORAL_TABLET | Freq: Once | ORAL | Status: AC
  Administered 2014-06-01: 4 mg via ORAL
  Filled 2014-06-01: qty 1

## 2014-06-01 NOTE — Progress Notes (Signed)
Physical Therapy Treatment Patient Details Name: [name] MRN: [id number] DOB: 1930-10-20 Today's Date: 06/01/2014    History of Present Illness Pt admitted with weakness and fever along with urinary incontinence urgency and frequency.  Pt was difficult to manage by wife in this state and pt fell getting to the bathroom.  evaluation and IV antibiotics in progress.    PT Comments    Pt very pleasant and willing to mobilize but reports increased weakness and difficulty with gait for 5 days with needed assist for transfers, but was walking dog 4mi immediately prior to this week. Pt educated for HEP, RW use, gait and function with encouragement to be OOB for all meals, continue HEP and currently use RW with increased tremor and fatigue.   Follow Up Recommendations  Home health PT     Equipment Recommendations  None recommended by PT    Recommendations for Other Services       Precautions / Restrictions Precautions Precautions: Fall Precaution Comments: tremor    Mobility  Bed Mobility Overal bed mobility: Needs Assistance Bed Mobility: Rolling;Sidelying to Sit Rolling: Min assist Sidelying to sit: Min assist       General bed mobility comments: cues for sequence with assist to fully bring legs off surface and elevate trunk  Transfers Overall transfer level: Needs assistance   Transfers: Sit to/from Stand Sit to Stand: Min guard         General transfer comment: cues for hand placement/safety  Ambulation/Gait Ambulation/Gait assistance: Min guard Ambulation Distance (Feet): 160 Feet Assistive device: Rolling walker (2 wheeled) Gait Pattern/deviations: Shuffle;Trunk flexed;Narrow base of support   Gait velocity interpretation: Below normal speed for age/gender General Gait Details: pt with increased tremor today and with use of cane reaching out for environmental support with other hand and transitioned to Rw with increased speed but flexed posture, running  into obstacles x 2. Pt fatigued with gait today with HR 90 and sats dropping to 86% on RA immediately after amb   Stairs            Wheelchair Mobility    Modified Rankin (Stroke Patients Only)       Balance Overall balance assessment: Needs assistance   Sitting balance-Leahy Scale: Fair       Standing balance-Leahy Scale: Poor                      Cognition Arousal/Alertness: Awake/alert Behavior During Therapy: WFL for tasks assessed/performed         Memory: Decreased short-term memory (pt getting off topic several times during session and unable to recal cues for HEP given)              Exercises General Exercises - Lower Extremity Long Arc Quad: AROM;Seated;Both;20 reps Hip ABduction/ADduction: AROM;Seated;Both;20 reps Hip Flexion/Marching: AROM;Seated;Both;20 reps    General Comments        Pertinent Vitals/Pain Pain Assessment: No/denies pain HR 90 sats dropping to 86% on Ra end of ambulation with seated rest and over 30sec able to recover to 90% and eventuall 94% on RA with cues for breathing. Pt reports dyspnea as new.     Home Living                      Prior Function            PT Goals (current goals can now be found in the care plan section) Progress towards PT goals: Progressing toward goals (  slowly with decreased activity tolerance today)    Frequency       PT Plan Discharge plan needs to be updated    Co-evaluation             End of Session Equipment Utilized During Treatment: Gait belt Activity Tolerance: Patient tolerated treatment well Patient left: in chair;with call bell/phone within reach;with chair alarm set     Time: 1304-1340 PT Time Calculation (min): 36 min  Charges:  $Gait Training: 8-22 mins $Therapeutic Exercise: 8-22 mins                    G Codes:      Melford Aase 2014-06-16, 1:54 PM Elwyn Reach, [place]

## 2014-06-01 NOTE — Progress Notes (Signed)
Pt's wife stated the patient had increased restlessness through the night.

## 2014-06-01 NOTE — Progress Notes (Signed)
ANTICOAGULATION CONSULT NOTE - Follow Up Consult  Pharmacy Consult for coumadin Indication: hx PE  Allergies  Allergen Reactions  . Lactose Intolerance (Gi)   . Nizatidine     REACTION: Rash (Axid)  . Penicillins     REACTION: Rash  . Sulfadiazine     REACTION: Hives    Patient Measurements: Height: 5\' 5"  (165.1 cm) Weight: 172 lb 3.2 oz (78.109 kg) IBW/kg (Calculated) : 61.5   Vital Signs: Temp: 99.8 F (37.7 C) (09/24 1145) Temp src: Oral (09/24 1145) BP: 109/53 mmHg (09/24 0519) Pulse Rate: 60 (09/24 0519)  Labs:  Recent Labs  05/29/14 1705  05/30/14 0752 05/31/14 0500 06/01/14 0720  HGB 12.3*  --  10.7* 9.9* 10.4*  HCT 36.8*  --  31.7* 30.0* 31.5*  PLT 89*  --  66* 61* 67*  LABPROT  --   < > 25.4* 24.2* 21.6*  INR  --   < > 2.31* 2.17* 1.88*  CREATININE 1.83*  --  1.73* 1.60*  --   < > = values in this interval not displayed.  Estimated Creatinine Clearance: 35.5 ml/min (by C-G formula based on Cr of 1.6).  Assessment: Patient is an 80 y.o M on coumadin for hx PE.  INR continues to trend down to 1.88 today.  No bleeding documented.  Goal of Therapy:  INR 2-3    Plan:  1) coumadin 4mg  PO x1 today  Bryann Mcnealy P 06/01/2014,1:02 PM

## 2014-06-01 NOTE — Progress Notes (Signed)
PATIENT DETAILS Name: [name] Age: 80 y.o. Sex: male Date of Birth: Jun 24, 1931 Admit Date: 05/29/2014 Admitting Physician Ivor Costa, MD [id number] Danise Mina, MD  Subjective: Doing well, febrile this am  Assessment/Plan: Principal Problem: Sepsis -secondary to pyelonephritis -better with IV Abx -Urine culture positive for Klebsiella  Active Problems:   Pyelonephritis  -still with fever,but fever curve better/down. Looks non toxic, but given fever-will check renal ultrasound to make sure no abscess -On IV Abx-day 5-will change to Rocephin -day 1 as Urine culture positive for pan-sensitive Klebsiella. Will plan on atleast 14 day course of Abx  Thrombocytopenia -likely secondary to acute illness -platelet seems to be slowly improving  CAD and Hyperlipidemia  -Stable, with no chest pain  -taking atorvastatin   Depression  -Stable no suicidal or homicidal ideation.  -Continue home medications.   Essential tremors  -Continue gabapentin   History of pulmonary embolism  -coumadin, continue. Per wife there is a strong family hx of death from blood clots. The patient coded with his PE in 1990s  -INR slightly subtherapeutic-pharmacy managing  CKD-III  -Creatinine close to usual baseline.  -Monitor renal function periodically  Disposition: Remain inpatient  DVT Prophylaxis: On coumadin  Code Status: Full code   Family Communication Spouse at bedside  Procedures:  None  CONSULTS:  None  Time spent 40 minutes-which includes 50% of the time with face-to-face with patient/ family and coordinating care related to the above assessment and plan.    MEDICATIONS: Scheduled Meds: . atorvastatin  40 mg Oral QHS  . cefTRIAXone (ROCEPHIN)  IV  2 g Intravenous Daily  . cholecalciferol  1,000 Units Oral Daily  . fenofibrate  160 mg Oral Daily  . gabapentin  100 mg Oral TID  . loratadine  10 mg Oral Daily  . oxybutynin  5 mg Oral BID  . pantoprazole   40 mg Oral Daily  . venlafaxine  75 mg Oral Daily  . vitamin B-12  1,000 mcg Oral Daily  . warfarin  4 mg Oral ONCE-1800  . Warfarin - Pharmacist Dosing Inpatient   Does not apply q1800   Continuous Infusions: . sodium chloride 40 mL/hr at 05/31/14 1545   PRN Meds:.acetaminophen, acetaminophen, albuterol  Antibiotics: Anti-infectives   Start     Dose/Rate Route Frequency Ordered Stop   06/01/14 0830  cefTRIAXone (ROCEPHIN) 2 g in dextrose 5 % 50 mL IVPB     2 g 100 mL/hr over 30 Minutes Intravenous Daily 06/01/14 0723     05/31/14 2200  levofloxacin (LEVAQUIN) IVPB 500 mg  Status:  Discontinued    Comments:  Levaquin 500 mg IV q48h for CrCl < 50 mL/min   500 mg 100 mL/hr over 60 Minutes Intravenous Every 48 hours 05/29/14 2251 05/30/14 1330   05/30/14 1800  cefTRIAXone (ROCEPHIN) 1 g in dextrose 5 % 50 mL IVPB  Status:  Discontinued     1 g 100 mL/hr over 30 Minutes Intravenous Every 24 hours 05/29/14 2232 05/29/14 2249   05/30/14 1400  imipenem-cilastatin (PRIMAXIN) 250 mg in sodium chloride 0.9 % 100 mL IVPB  Status:  Discontinued     250 mg 200 mL/hr over 30 Minutes Intravenous Every 6 hours 05/30/14 1354 06/01/14 0723   05/29/14 2359  levofloxacin (LEVAQUIN) IVPB 500 mg     500 mg 100 mL/hr over 60 Minutes Intravenous  Once 05/29/14 2251 05/30/14 0004   05/29/14 2230  cefTRIAXone (ROCEPHIN) 1 g in dextrose 5 %  50 mL IVPB  Status:  Discontinued     1 g 100 mL/hr over 30 Minutes Intravenous Every 24 hours 05/29/14 2228 05/29/14 2231   05/29/14 1730  cefTRIAXone (ROCEPHIN) 1 g in dextrose 5 % 50 mL IVPB     1 g 100 mL/hr over 30 Minutes Intravenous  Once 05/29/14 1729 05/29/14 1819       PHYSICAL EXAM: Vital signs in last 24 hours: Filed Vitals:   06/01/14 0619 06/01/14 0844 06/01/14 1005 06/01/14 1145  BP:      Pulse:      Temp: 100.2 F (37.9 C) 100.8 F (38.2 C) 99 F (37.2 C) 99.8 F (37.7 C)  TempSrc: Oral Axillary Oral Oral  Resp:      Height:        Weight:      SpO2:        Weight change:  Filed Weights   05/29/14 2239 05/30/14 0529 06/01/14 0519  Weight: 74.6 kg (164 lb 7.4 oz) 75 kg (165 lb 5.5 oz) 78.109 kg (172 lb 3.2 oz)   Body mass index is 28.66 kg/(m^2).   Gen Exam: Awake and alert with clear speech.   Neck: Supple, No JVD.   Chest: B/L Clear.   CVS: S1 S2 Regular, no murmurs.  Abdomen: soft, BS +, non tender, non distended. Still CVA tenderness B/L Extremities: no edema, lower extremities warm to touch. Neurologic: Non Focal.  Skin: No Rash.   Wounds: N/A.    Intake/Output from previous day:  Intake/Output Summary (Last 24 hours) at 06/01/14 1320 Last data filed at 06/01/14 1005  Gross per 24 hour  Intake    790 ml  Output   1050 ml  Net   -260 ml     LAB RESULTS: CBC  Recent Labs Lab 05/29/14 1705 05/30/14 0752 05/31/14 0500 06/01/14 0720  WBC 10.4 10.4 6.1 4.7  HGB 12.3* 10.7* 9.9* 10.4*  HCT 36.8* 31.7* 30.0* 31.5*  PLT 89* 66* 61* 67*  MCV 106.7* 107.8* 107.1* 109.8*  MCH 35.7* 36.4* 35.4* 36.2*  MCHC 33.4 33.8 33.0 33.0  RDW 14.4 14.8 14.9 14.8  LYMPHSABS 0.4*  --   --   --   MONOABS 0.2  --   --   --   EOSABS 0.0  --   --   --   BASOSABS 0.0  --   --   --     Chemistries   Recent Labs Lab 05/29/14 1705 05/30/14 0752 05/31/14 0500  NA 138 140 142  K 4.1 4.3 4.2  CL 102 109 113*  CO2 23 22 18*  GLUCOSE 96 90 107*  BUN 30* 30* 31*  CREATININE 1.83* 1.73* 1.60*  CALCIUM 9.7 8.1* 8.0*    CBG: No results found for this basename: GLUCAP,  in the last 168 hours  GFR Estimated Creatinine Clearance: 35.5 ml/min (by C-G formula based on Cr of 1.6).  Coagulation profile  Recent Labs Lab 05/29/14 2245 05/30/14 0752 05/31/14 0500 06/01/14 0720  INR 2.23* 2.31* 2.17* 1.88*    Cardiac Enzymes No results found for this basename: CK, CKMB, TROPONINI, MYOGLOBIN,  in the last 168 hours  No components found with this basename: POCBNP,  No results found for this basename:  DDIMER,  in the last 72 hours No results found for this basename: HGBA1C,  in the last 72 hours No results found for this basename: CHOL, HDL, LDLCALC, TRIG, CHOLHDL, LDLDIRECT,  in the last 72 hours No results found  for this basename: TSH, T4TOTAL, FREET3, T3FREE, THYROIDAB,  in the last 72 hours No results found for this basename: VITAMINB12, FOLATE, FERRITIN, TIBC, IRON, RETICCTPCT,  in the last 72 hours No results found for this basename: LIPASE, AMYLASE,  in the last 72 hours  Urine Studies No results found for this basename: UACOL, UAPR, USPG, UPH, UTP, UGL, UKET, UBIL, UHGB, UNIT, UROB, ULEU, UEPI, UWBC, URBC, UBAC, CAST, CRYS, UCOM, BILUA,  in the last 72 hours  MICROBIOLOGY: Recent Results (from the past 240 hour(s))  CULTURE, BLOOD (ROUTINE X 2)     Status: None   Collection Time    05/29/14  5:20 PM      Result Value Ref Range Status   Specimen Description BLOOD RIGHT ANTECUBITAL   Final   Special Requests BOTTLES DRAWN AEROBIC ONLY 10CC   Final   Culture  Setup Time     Final   Value: 05/29/2014 22:30     Performed at [facility]   Culture     Final   Value:        BLOOD CULTURE RECEIVED NO GROWTH TO DATE CULTURE WILL BE HELD FOR 5 DAYS BEFORE ISSUING A FINAL NEGATIVE REPORT     Performed at [facility]   Report Status PENDING   Incomplete  CULTURE, BLOOD (ROUTINE X 2)     Status: None   Collection Time    05/29/14  5:21 PM      Result Value Ref Range Status   Specimen Description BLOOD LEFT ANTECUBITAL   Final   Special Requests BOTTLES DRAWN AEROBIC ONLY 5CC   Final   Culture  Setup Time     Final   Value: 05/29/2014 22:31     Performed at [facility]   Culture     Final   Value:        BLOOD CULTURE RECEIVED NO GROWTH TO DATE CULTURE WILL BE HELD FOR 5 DAYS BEFORE ISSUING A FINAL NEGATIVE REPORT     Performed at [facility]   Report Status PENDING   Incomplete  URINE CULTURE     Status: None   Collection Time    05/29/14  5:53  PM      Result Value Ref Range Status   Specimen Description URINE, CATHETERIZED   Final   Special Requests NONE   Final   Culture  Setup Time     Final   Value: 05/30/2014 01:49     Performed at [place]     Final   Value: >=100,000 COLONIES/ML     Performed at [facility]   Culture     Final   Value: KLEBSIELLA OXYTOCA     Performed at [facility]   Report Status 05/31/2014 FINAL   Final   Organism ID, Bacteria KLEBSIELLA OXYTOCA   Final    RADIOLOGY STUDIES/RESULTS: Dg Chest 2 View  05/29/2014   CLINICAL DATA:  Fever, UTI  EXAM: CHEST  2 VIEW  COMPARISON:  03/21/2013  FINDINGS: Lungs are essentially clear. No focal consolidation. No pleural effusion or pneumothorax.  The heart is normal in size.  Calcified right perihilar nodes.  Degenerative changes of the visualized thoracolumbar spine.  IMPRESSION: No evidence of acute cardiopulmonary disease.   Electronically Signed   By: Julian Hy M.D.   On: 05/29/2014 19:18   US Renal  06/01/2014   CLINICAL DATA:  Pyelonephritis. Persistent fever. Question of abscess.  Prior cystoscopy. Pre diabetes.  EXAM: RENAL/URINARY TRACT ULTRASOUND COMPLETE  COMPARISON:  Ultrasound of the abdomen on 08/23/2010  FINDINGS: Right Kidney:  Length: 11.1 cm. There is renal parenchymal thinning. No hydronephrosis. Renal echogenicity is normal. No focal mass.  Left Kidney:  Length: 11.4 cm in length. Renal parenchymal thinning. No hydronephrosis. Cyst is 3.4 x 2.9 x 3.0 cm and upper pole region.  Bladder:  Appears normal for degree of bladder distention.  IMPRESSION: 1. No hydronephrosis. 2. Left renal cyst. 3. No abscess identified.   Electronically Signed   By: Shon Hale M.D.   On: 06/01/2014 10:52    Oren Binet, MD  [facility] Pager:[phone]  If 7PM-7AM, please contact night-coverage [email or web address] Password [email or web address] 06/01/2014, 1:20 PM   LOS: 3 days   **Disclaimer: This note may have been  dictated with voice recognition software. Similar sounding words can inadvertently be transcribed and this note may contain transcription errors which may not have been corrected upon publication of note.**

## 2014-06-01 NOTE — Progress Notes (Signed)
Pt with a temp of 101.2. Pt given 650 mg of po tylenol. Provider on call is called. Awaiting further orders.

## 2014-06-01 NOTE — Progress Notes (Signed)
Pt temp was 102.6 F. PO tylenol 650mg . MD notified. Will continue to monitor.

## 2014-06-02 ENCOUNTER — Inpatient Hospital Stay (HOSPITAL_COMMUNITY): Payer: Medicare Other

## 2014-06-02 DIAGNOSIS — D6949 Other primary thrombocytopenia: Secondary | ICD-10-CM

## 2014-06-02 DIAGNOSIS — I359 Nonrheumatic aortic valve disorder, unspecified: Secondary | ICD-10-CM

## 2014-06-02 DIAGNOSIS — F3289 Other specified depressive episodes: Secondary | ICD-10-CM

## 2014-06-02 DIAGNOSIS — F329 Major depressive disorder, single episode, unspecified: Secondary | ICD-10-CM

## 2014-06-02 LAB — PROTIME-INR
INR: 2.56 — AB (ref 0.00–1.49)
Prothrombin Time: 27.5 seconds — ABNORMAL HIGH (ref 11.6–15.2)

## 2014-06-02 MED ORDER — WARFARIN SODIUM 2 MG PO TABS
2.0000 mg | ORAL_TABLET | Freq: Once | ORAL | Status: AC
  Administered 2014-06-02: 2 mg via ORAL
  Filled 2014-06-02: qty 1

## 2014-06-02 MED ORDER — ACETAMINOPHEN 325 MG PO TABS
650.0000 mg | ORAL_TABLET | Freq: Once | ORAL | Status: AC
  Administered 2014-06-02: 650 mg via ORAL

## 2014-06-02 MED ORDER — FUROSEMIDE 10 MG/ML IJ SOLN
40.0000 mg | Freq: Once | INTRAMUSCULAR | Status: AC
  Administered 2014-06-02: 40 mg via INTRAVENOUS
  Filled 2014-06-02: qty 4

## 2014-06-02 NOTE — Progress Notes (Addendum)
MD made aware of temp 103F oral rechecked 1hr after tylenol administration. Pt experiencing shaking. Order to recheck temp in 30-40 minutes. Blankets taken off and cool cloth applied to forehead to attempt to cool patient. All other vital signs stable.   6:53 PM MD made aware or 102.13F oral temp. Telephone order for 650mg  one time dose of tylenol and recheck temp in two hours.

## 2014-06-02 NOTE — Progress Notes (Signed)
Echo Lab  2D Echocardiogram completed.  [place], [place] 06/02/2014 4:22 PM

## 2014-06-02 NOTE — Progress Notes (Addendum)
PROGRESS NOTE  FINNEAN CERAMI HWK:[id number] DOB: Jun 13, 1931 DOA: 05/29/2014 PCP: Ria Bush, MD  Subjective: No major changes since yesterday. Febrile in morning at 100.6. Appetite low but eating some. Less burning while urinating. Urinating frequency improving. Energy level still low. Tired with frequent naps. Weakness with ambulation. Had loose BM this morning.   Wife concerned about mild shortness of breath this morning.  Assessment/Plan:  Principal Problem:  Sepsis  -secondary to pyelonephritis  -Urine culture positive for Klebsiella, continue with IV Rocephin-overall better, with mild fever intermittently.  Active Problems:  Pyelonephritis  -still with fever but curve is slowly improving. Looks non toxic. -Renal US on 9/25 showed no abscess, normal bladder, no hydronephrosis. There was a renal cyst which is likely unrelated.  -On IV Abx day 6. On day 2 of rocephin. 14 day minimum course. - Still with intermittent feve (but overall improved) r-no evidence of renal abscess/pyelonephrosis, if continues to be febrile, will obtain a CT of the abdomen, and an infectious disease consult. For now continue with IV antibiotics.  Suspected acute diastolic heart failure - Chest x-ray confirms mild pulmonary edema, stop IV fluids. We'll give one dose of intravenous Lasix 40 mg today. - Check echocardiogram, repeat electrolytes in a.m.  Thrombocytopenia  -likely secondary to acute illness  -platelet seems to be slowly improving-check CBC periodically  CAD and Hyperlipidemia  -Stable, with no chest pain  -on atorvastatin   Depression  -Stable no suicidal or homicidal ideation.  -Continue home medications.   Essential tremors  -Continue gabapentin   History of pulmonary embolism  -coumadin, continue per pharmacy. Per wife there is a strong family hx of death from blood clots. The patient coded with his PE in 1990s   CKD-III  -Creatinine close to usual baseline.  -Monitor  renal function periodically  Decreased Appetite -Eating, but small amounts -Supplement afternoons with resource breeze (patient lactose intolerant)   DVT Prophylaxis:  Coumadin  Code Status: Full code Family Communication: Wife is at bedside. Former Designer, jewellery.  Disposition Plan: Home    Consultants:  none  Procedures:  none  Antibiotics: Anti-infectives   Start     Dose/Rate Route Frequency Ordered Stop   06/01/14 0830  cefTRIAXone (ROCEPHIN) 2 g in dextrose 5 % 50 mL IVPB     2 g 100 mL/hr over 30 Minutes Intravenous Daily 06/01/14 0723     05/31/14 2200  levofloxacin (LEVAQUIN) IVPB 500 mg  Status:  Discontinued    Comments:  Levaquin 500 mg IV q48h for CrCl < 50 mL/min   500 mg 100 mL/hr over 60 Minutes Intravenous Every 48 hours 05/29/14 2251 05/30/14 1330   05/30/14 1800  cefTRIAXone (ROCEPHIN) 1 g in dextrose 5 % 50 mL IVPB  Status:  Discontinued     1 g 100 mL/hr over 30 Minutes Intravenous Every 24 hours 05/29/14 2232 05/29/14 2249   05/30/14 1400  imipenem-cilastatin (PRIMAXIN) 250 mg in sodium chloride 0.9 % 100 mL IVPB  Status:  Discontinued     250 mg 200 mL/hr over 30 Minutes Intravenous Every 6 hours 05/30/14 1354 06/01/14 0723   05/29/14 2359  levofloxacin (LEVAQUIN) IVPB 500 mg     500 mg 100 mL/hr over 60 Minutes Intravenous  Once 05/29/14 2251 05/30/14 0004   05/29/14 2230  cefTRIAXone (ROCEPHIN) 1 g in dextrose 5 % 50 mL IVPB  Status:  Discontinued     1 g 100 mL/hr over 30 Minutes Intravenous Every 24 hours 05/29/14 2228 05/29/14 2231  05/29/14 1730  cefTRIAXone (ROCEPHIN) 1 g in dextrose 5 % 50 mL IVPB     1 g 100 mL/hr over 30 Minutes Intravenous  Once 05/29/14 1729 05/29/14 1819      Objective: Filed Vitals:   06/01/14 1702 06/01/14 2219 06/02/14 0539 06/02/14 0825  BP:  101/35 112/49   Pulse:  70 70   Temp: 98.3 F (36.8 C) 99.1 F (37.3 C) 100.6 F (38.1 C) 100 F (37.8 C)  TempSrc: Oral Oral Oral Oral  Resp:  18 18     Height:      Weight:   78.109 kg (172 lb 3.2 oz)   SpO2:  92% 93%     Intake/Output Summary (Last 24 hours) at 06/02/14 1145 Last data filed at 06/02/14 1010  Gross per 24 hour  Intake    440 ml  Output      0 ml  Net    440 ml   Filed Weights   05/30/14 0529 06/01/14 0519 06/02/14 0539  Weight: 75 kg (165 lb 5.5 oz) 78.109 kg (172 lb 3.2 oz) 78.109 kg (172 lb 3.2 oz)    Exam: General: Well developed, well nourished, NAD, appears stated age, difficult to keep awake HEENT:  Anicteic Sclera, MMM. No pharyngeal erythema or exudates  Neck: Supple, no JVD, no masses, no carotid bruits Cardiovascular: RRR, S1 S2 auscultated, no rubs, murmurs or gallops  Respiratory: Equal chest rise, fine crackles noted at base of right lung with expiration. Abdomen: Soft, mildly distended, + bowel sounds, bilateral tenderness in lower quadrants with deep palpation, no CVA tenderness, no masses detected. Extremities: warm dry without cyanosis clubbing or edema, 2+ pulses in  DT, PT. Neuro: AAOx3, cranial nerves grossly intact. Strength 5/5 in upper and lower extremities  Skin: Without rashes exudates or nodules.   Psych: Normal affect and demeanor with intact judgement and insight   Data Reviewed: Basic Metabolic Panel:  Recent Labs Lab 05/29/14 1705 05/30/14 0752 05/31/14 0500  NA 138 140 142  K 4.1 4.3 4.2  CL 102 109 113*  CO2 23 22 18*  GLUCOSE 96 90 107*  BUN 30* 30* 31*  CREATININE 1.83* 1.73* 1.60*  CALCIUM 9.7 8.1* 8.0*   Liver Function Tests:  Recent Labs Lab 05/29/14 1705  AST 51*  ALT 27  ALKPHOS 38*  BILITOT 1.5*  PROT 6.5  ALBUMIN 3.5   No results found for this basename: LIPASE, AMYLASE,  in the last 168 hours No results found for this basename: AMMONIA,  in the last 168 hours CBC:  Recent Labs Lab 05/29/14 1705 05/30/14 0752 05/31/14 0500 06/01/14 0720  WBC 10.4 10.4 6.1 4.7  NEUTROABS 9.8*  --   --   --   HGB 12.3* 10.7* 9.9* 10.4*  HCT 36.8* 31.7*  30.0* 31.5*  MCV 106.7* 107.8* 107.1* 109.8*  PLT 89* 66* 61* 67*   Cardiac Enzymes: No results found for this basename: CKTOTAL, CKMB, CKMBINDEX, TROPONINI,  in the last 168 hours BNP (last 3 results) No results found for this basename: PROBNP,  in the last 8760 hours CBG: No results found for this basename: GLUCAP,  in the last 168 hours  Recent Results (from the past 240 hour(s))  CULTURE, BLOOD (ROUTINE X 2)     Status: None   Collection Time    05/29/14  5:20 PM      Result Value Ref Range Status   Specimen Description BLOOD RIGHT ANTECUBITAL   Final   Special Requests  BOTTLES DRAWN AEROBIC ONLY 10CC   Final   Culture  Setup Time     Final   Value: 05/29/2014 22:30     Performed at [facility]   Culture     Final   Value:        BLOOD CULTURE RECEIVED NO GROWTH TO DATE CULTURE WILL BE HELD FOR 5 DAYS BEFORE ISSUING A FINAL NEGATIVE REPORT     Performed at [facility]   Report Status PENDING   Incomplete  CULTURE, BLOOD (ROUTINE X 2)     Status: None   Collection Time    05/29/14  5:21 PM      Result Value Ref Range Status   Specimen Description BLOOD LEFT ANTECUBITAL   Final   Special Requests BOTTLES DRAWN AEROBIC ONLY 5CC   Final   Culture  Setup Time     Final   Value: 05/29/2014 22:31     Performed at [facility]   Culture     Final   Value:        BLOOD CULTURE RECEIVED NO GROWTH TO DATE CULTURE WILL BE HELD FOR 5 DAYS BEFORE ISSUING A FINAL NEGATIVE REPORT     Performed at [facility]   Report Status PENDING   Incomplete  URINE CULTURE     Status: None   Collection Time    05/29/14  5:53 PM      Result Value Ref Range Status   Specimen Description URINE, CATHETERIZED   Final   Special Requests NONE   Final   Culture  Setup Time     Final   Value: 05/30/2014 01:49     Performed at [place]     Final   Value: >=100,000 COLONIES/ML     Performed at [facility]   Culture     Final    Value: KLEBSIELLA OXYTOCA     Performed at [facility]   Report Status 05/31/2014 FINAL   Final   Organism ID, Bacteria KLEBSIELLA OXYTOCA   Final     Studies: US Renal  06/01/2014   CLINICAL DATA:  Pyelonephritis. Persistent fever. Question of abscess. Prior cystoscopy. Pre diabetes.  EXAM: RENAL/URINARY TRACT ULTRASOUND COMPLETE  COMPARISON:  Ultrasound of the abdomen on 08/23/2010  FINDINGS: Right Kidney:  Length: 11.1 cm. There is renal parenchymal thinning. No hydronephrosis. Renal echogenicity is normal. No focal mass.  Left Kidney:  Length: 11.4 cm in length. Renal parenchymal thinning. No hydronephrosis. Cyst is 3.4 x 2.9 x 3.0 cm and upper pole region.  Bladder:  Appears normal for degree of bladder distention.  IMPRESSION: 1. No hydronephrosis. 2. Left renal cyst. 3. No abscess identified.   Electronically Signed   By: Shon Hale M.D.   On: 06/01/2014 10:52    Scheduled Meds: . atorvastatin  40 mg Oral QHS  . cefTRIAXone (ROCEPHIN)  IV  2 g Intravenous Daily  . cholecalciferol  1,000 Units Oral Daily  . fenofibrate  160 mg Oral Daily  . gabapentin  100 mg Oral TID  . loratadine  10 mg Oral Daily  . oxybutynin  5 mg Oral BID  . pantoprazole  40 mg Oral Daily  . venlafaxine  75 mg Oral Daily  . vitamin B-12  1,000 mcg Oral Daily  . warfarin  2 mg Oral ONCE-1800  . Warfarin - Pharmacist Dosing Inpatient   Does not apply q1800   Continuous Infusions: .  sodium chloride 40 mL/hr at 05/31/14 1545    Principal Problem:   Pyelonephritis Active Problems:   THROMBOCYTOPENIA, PRIMARY NOS   CORONARY ARTERY DISEASE   CKD (chronic kidney disease) stage 3, GFR 30-59 ml/min   HLD (hyperlipidemia)   Depression   Sepsis  Darcella Gasman, PA-S  [facility] Pager [phone]. If 7PM-7AM, please contact night-coverage at [email or web address], password [facility] 06/02/2014, 11:45 AM  LOS: 4 days   Attending Patient was seen, examined,treatment plan was discussed with the Physician  extender. I have directly reviewed the clinical findings, lab, imaging studies and management of this patient in detail. I have made the necessary changes to the above noted documentation, and agree with the documentation, as recorded by the Physician extender.  Overall improved, intermittent fever continues, we'll continue with Rocephin. If still febrile over the next few days, a CT of the abdomen will be obtained along with infectious disease consultation. Rest as above  Nena Alexander MD [facility].

## 2014-06-02 NOTE — Progress Notes (Signed)
ANTICOAGULATION CONSULT NOTE - Follow Up Consult  Pharmacy Consult for coumadin Indication: hx PE  Allergies  Allergen Reactions  . Lactose Intolerance (Gi)   . Nizatidine     REACTION: Rash (Axid)  . Penicillins     REACTION: Rash  . Sulfadiazine     REACTION: Hives    Patient Measurements: Height: 5\' 5"  (165.1 cm) Weight: 172 lb 3.2 oz (78.109 kg) IBW/kg (Calculated) : 61.5  Vital Signs: Temp: 100 F (37.8 C) (09/25 0825) Temp src: Oral (09/25 0825) BP: 112/49 mmHg (09/25 0539) Pulse Rate: 70 (09/25 0539)  Labs:  Recent Labs  05/31/14 0500 06/01/14 0720 06/02/14 0950  HGB 9.9* 10.4*  --   HCT 30.0* 31.5*  --   PLT 61* 67*  --   LABPROT 24.2* 21.6* 27.5*  INR 2.17* 1.88* 2.56*  CREATININE 1.60*  --   --     Estimated Creatinine Clearance: 35.5 ml/min (by C-G formula based on Cr of 1.6).   Assessment: Patient is an 80 y.o M on coumadin for hx PE.  INR is therapeutic but  increased from 1.88 to 2.56 today.  No bleeding documented.  Goal of Therapy:  INR 2-3    Plan:  1) coumadin 2 mg PO x1 today    Jodie Cavey P 06/02/2014,11:19 AM

## 2014-06-03 ENCOUNTER — Inpatient Hospital Stay (HOSPITAL_COMMUNITY): Payer: Medicare Other

## 2014-06-03 ENCOUNTER — Encounter (HOSPITAL_COMMUNITY): Payer: Self-pay | Admitting: Radiology

## 2014-06-03 LAB — BASIC METABOLIC PANEL
Anion gap: 13 (ref 5–15)
BUN: 26 mg/dL — AB (ref 6–23)
CHLORIDE: 105 meq/L (ref 96–112)
CO2: 24 meq/L (ref 19–32)
Calcium: 8.6 mg/dL (ref 8.4–10.5)
Creatinine, Ser: 1.43 mg/dL — ABNORMAL HIGH (ref 0.50–1.35)
GFR calc Af Amer: 52 mL/min — ABNORMAL LOW (ref >90)
GFR calc non Af Amer: 45 mL/min — ABNORMAL LOW (ref >90)
Glucose, Bld: 107 mg/dL — ABNORMAL HIGH (ref 70–99)
Potassium: 3.8 mEq/L (ref 3.7–5.3)
Sodium: 142 mEq/L (ref 137–147)

## 2014-06-03 LAB — INFLUENZA PANEL BY PCR (TYPE A & B)
H1N1FLUPCR: NOT DETECTED
INFLBPCR: NEGATIVE
Influenza A By PCR: NEGATIVE

## 2014-06-03 LAB — HEPATIC FUNCTION PANEL
ALT: 42 U/L (ref 0–53)
AST: 73 U/L — ABNORMAL HIGH (ref 0–37)
Albumin: 2.4 g/dL — ABNORMAL LOW (ref 3.5–5.2)
Alkaline Phosphatase: 65 U/L (ref 39–117)
BILIRUBIN TOTAL: 0.8 mg/dL (ref 0.3–1.2)
Bilirubin, Direct: 0.3 mg/dL (ref 0.0–0.3)
Indirect Bilirubin: 0.5 mg/dL (ref 0.3–0.9)
TOTAL PROTEIN: 5.6 g/dL — AB (ref 6.0–8.3)

## 2014-06-03 LAB — CBC
HEMATOCRIT: 30.7 % — AB (ref 39.0–52.0)
HEMOGLOBIN: 10.4 g/dL — AB (ref 13.0–17.0)
MCH: 35.6 pg — ABNORMAL HIGH (ref 26.0–34.0)
MCHC: 33.9 g/dL (ref 30.0–36.0)
MCV: 105.1 fL — ABNORMAL HIGH (ref 78.0–100.0)
Platelets: 87 10*3/uL — ABNORMAL LOW (ref 150–400)
RBC: 2.92 MIL/uL — ABNORMAL LOW (ref 4.22–5.81)
RDW: 14.5 % (ref 11.5–15.5)
WBC: 7 10*3/uL (ref 4.0–10.5)

## 2014-06-03 LAB — PROTIME-INR
INR: 3.06 — AB (ref 0.00–1.49)
Prothrombin Time: 31.6 seconds — ABNORMAL HIGH (ref 11.6–15.2)

## 2014-06-03 LAB — PRO B NATRIURETIC PEPTIDE: Pro B Natriuretic peptide (BNP): 6915 pg/mL — ABNORMAL HIGH (ref 0–450)

## 2014-06-03 LAB — CK: Total CK: 55 U/L (ref 7–232)

## 2014-06-03 MED ORDER — IOHEXOL 300 MG/ML  SOLN
25.0000 mL | INTRAMUSCULAR | Status: AC
  Administered 2014-06-03 (×2): 25 mL via ORAL

## 2014-06-03 MED ORDER — WARFARIN SODIUM 2 MG PO TABS
2.0000 mg | ORAL_TABLET | Freq: Once | ORAL | Status: AC
  Administered 2014-06-03: 2 mg via ORAL
  Filled 2014-06-03: qty 1

## 2014-06-03 MED ORDER — TRAMADOL HCL 50 MG PO TABS
50.0000 mg | ORAL_TABLET | Freq: Four times a day (QID) | ORAL | Status: DC | PRN
  Administered 2014-06-03 – 2014-06-06 (×6): 50 mg via ORAL
  Filled 2014-06-03 (×6): qty 1

## 2014-06-03 MED ORDER — FUROSEMIDE 10 MG/ML IJ SOLN
40.0000 mg | Freq: Once | INTRAMUSCULAR | Status: AC
  Administered 2014-06-03: 40 mg via INTRAVENOUS
  Filled 2014-06-03: qty 4

## 2014-06-03 NOTE — Progress Notes (Signed)
ANTICOAGULATION CONSULT NOTE - Follow Up Consult  Pharmacy Consult for Warfarin Indication: Hx of PE  Allergies  Allergen Reactions  . Lactose Intolerance (Gi)   . Nizatidine     REACTION: Rash (Axid)  . Penicillins     REACTION: Rash  . Sulfadiazine     REACTION: Hives    Patient Measurements: Height: 5\' 5"  (165.1 cm) Weight: 175 lb 6.4 oz (79.561 kg) IBW/kg (Calculated) : 61.5  Vital Signs: Temp: 98 F (36.7 C) (09/26 1050) Temp src: Oral (09/26 0711) BP: 126/42 mmHg (09/26 0711) Pulse Rate: 70 (09/26 0711)  Labs:  Recent Labs  06/01/14 0720 06/02/14 0950 06/03/14 0650  HGB 10.4*  --  10.4*  HCT 31.5*  --  30.7*  PLT 67*  --  87*  LABPROT 21.6* 27.5* 31.6*  INR 1.88* 2.56* 3.06*  CREATININE  --   --  1.43*    Estimated Creatinine Clearance: 40 ml/min (by C-G formula based on Cr of 1.43).  Assessment: 50 yoM on coumadin for history of PE.  INR is slightly supratherapeutic today, continuing to trend up from 2.56 to 3.06.  INR may be reflective of 4 mg dose given on 9/24.  Patient received 2 mg of warfarin on 9/25.  Will repeat dose tonight.  Goal of Therapy:  INR 2-3 Monitor platelets by anticoagulation protocol: Yes   Plan:  1) Warfarin 2 mg po X 1 today 2) Daily INR  Theron Arista, PharmD Clinical Pharmacist - Resident Pager: [phone] 9/26/201512:15 PM,

## 2014-06-03 NOTE — Consult Note (Signed)
[facility]     Reason for Consult: pyelonephritis with persistent fever    Referring Physician: Dr. Sloan Leiter  Principal Problem:   Pyelonephritis Active Problems:   THROMBOCYTOPENIA, PRIMARY NOS   CORONARY ARTERY DISEASE   CKD (chronic kidney disease) stage 3, GFR 30-59 ml/min   HLD (hyperlipidemia)   Depression   Sepsis   . atorvastatin  40 mg Oral QHS  . cefTRIAXone (ROCEPHIN)  IV  2 g Intravenous Daily  . cholecalciferol  1,000 Units Oral Daily  . fenofibrate  160 mg Oral Daily  . gabapentin  100 mg Oral TID  . loratadine  10 mg Oral Daily  . oxybutynin  5 mg Oral BID  . pantoprazole  40 mg Oral Daily  . venlafaxine  75 mg Oral Daily  . vitamin B-12  1,000 mcg Oral Daily  . Warfarin - Pharmacist Dosing Inpatient   Does not apply q1800    Recommendations: CT C/A/P  continue ceftriaxone Check flu Check CK total for ? myositis  Assessment: He presented with symptoms concerning for urinary source of infection with a urinalysis with WBCs and bacteria and now has grown K. Oxytoca, though symptoms are mainly chronic with some subtle differences the night of admission.  Other symptoms include myalgias and concerning for influenza with myalgias or less likely viral myositis. Initial WBC only 10.4, though neutrophil predominance.   He had a mild AST elevation, no CK noted.    Antibiotics: ceftriaxone  HPI: [name] is a 80 y.o. male with a history of urinary incontinence, frequency and sometimes urgency at baseline who presented on 9/21 with somewhat worsening urinary symptoms, extreme weakness much different from his baseline, muscle aches "all over".  He in fact was unable to get up even with help from his wife.  No SOB, no n/v/d.  No sick contacts, no travel.  No small kids around.  Got flu shot.  Has maintained fever since admission.  Urinalysis concerning for infection.  Urine culture positive, blood cultures remained negative.      Review of Systems: A comprehensive review of systems was negative.  Past Medical History  Diagnosis Date  . GERD (gastroesophageal reflux disease)     h/o PUD  . CAD (coronary artery disease)     cath 2000 30% single vessel, normal nuclear stress test 12/03/2010, no evidence ischemia  . Renal insufficiency     baseline Cr 1.7  . Depression   . Hx pulmonary embolism 08/1991    coumadin  . History of phlebitis   . Allergic rhinitis   . Urge incontinence   . Elevated PSA     previous-normalized (followed by Dr. Jeffie Pollock, rec no repeat unless urinary sxs)  . HLD (hyperlipidemia)     hypertriglyceridemia  . Asthma     remote  . Arthritis   . Blood transfusion 1990's  . Schatzki's ring 02/2012    s/p dilation [name]  . History of fracture of right hip   . Hearing loss 06/2012    eval - rec annual exam  . Essential tremor 09/27/2008  . Prediabetes   . DISH (diffuse idiopathic skeletal hyperostosis) 03/2013    lumbar spine on xray  . DDD (degenerative disc disease) 03/2013    by CT, diffuse multilevel cervical and lumbar spondylosis  . Osteoporosis 11/2013    DEX AT score -2.7  . Anemia     no per pt  . Sleep apnea     no  CPAP- sleep apnea "cleared up 15 years ago"  . Dizziness   . Cancer     pre- cancerous areas removed from forehead and back    History  Substance Use Topics  . Smoking status: Never Smoker   . Smokeless tobacco: Never Used  . Alcohol Use: No    Family History  Problem Relation Age of Onset  . Stroke Mother   . Hypertension Mother   . Cancer Brother     prostate with mets  . Blindness Brother     legally  . Diabetes Brother   . Pulmonary embolism Sister     from shoulder operation  . Alcohol abuse Brother   . Colon cancer Neg Hx   . Esophageal cancer Neg Hx   . Rectal cancer Neg Hx   . Stomach cancer Neg Hx    Allergies  Allergen Reactions  . Lactose Intolerance (Gi)   . Nizatidine     REACTION: Rash (Axid)  . Penicillins      REACTION: Rash  . Sulfadiazine     REACTION: Hives    OBJECTIVE: Blood pressure 126/42, pulse 70, temperature 98 F (36.7 C), temperature source Oral, resp. rate 17, height 5\' 5"  (1.651 m), weight 175 lb 6.4 oz (79.561 kg), SpO2 93.00%. General: awake, alert, nad Skin: no rashes Lungs: CTA B, no crackles noted Cor: RRR without m Abdomen: soft, nt, nd Ext: no edema  Microbiology: Recent Results (from the past 240 hour(s))  CULTURE, BLOOD (ROUTINE X 2)     Status: None   Collection Time    05/29/14  5:20 PM      Result Value Ref Range Status   Specimen Description BLOOD RIGHT ANTECUBITAL   Final   Special Requests BOTTLES DRAWN AEROBIC ONLY 10CC   Final   Culture  Setup Time     Final   Value: 05/29/2014 22:30     Performed at [facility]   Culture     Final   Value:        BLOOD CULTURE RECEIVED NO GROWTH TO DATE CULTURE WILL BE HELD FOR 5 DAYS BEFORE ISSUING A FINAL NEGATIVE REPORT     Performed at [facility]   Report Status PENDING   Incomplete  CULTURE, BLOOD (ROUTINE X 2)     Status: None   Collection Time    05/29/14  5:21 PM      Result Value Ref Range Status   Specimen Description BLOOD LEFT ANTECUBITAL   Final   Special Requests BOTTLES DRAWN AEROBIC ONLY 5CC   Final   Culture  Setup Time     Final   Value: 05/29/2014 22:31     Performed at [facility]   Culture     Final   Value:        BLOOD CULTURE RECEIVED NO GROWTH TO DATE CULTURE WILL BE HELD FOR 5 DAYS BEFORE ISSUING A FINAL NEGATIVE REPORT     Performed at [facility]   Report Status PENDING   Incomplete  URINE CULTURE     Status: None   Collection Time    05/29/14  5:53 PM      Result Value Ref Range Status   Specimen Description URINE, CATHETERIZED   Final   Special Requests NONE   Final   Culture  Setup Time     Final   Value: 05/30/2014 01:49     Performed at [facility]  Final   Value: >=100,000 COLONIES/ML     Performed at  [facility]   Culture     Final   Value: KLEBSIELLA OXYTOCA     Performed at [facility]   Report Status 05/31/2014 FINAL   Final   Organism ID, Bacteria KLEBSIELLA OXYTOCA   Final    Scharlene Gloss, [email or web address] [phone] pager  [phone] cell 06/03/2014, 12:01 PM

## 2014-06-03 NOTE — Progress Notes (Signed)
PATIENT DETAILS Name: [name] Age: 80 y.o. Sex: male Date of Birth: [birth year] Admit Date: 05/29/2014 Admitting Physician Ivor Costa, MD [id number] Gutierrez, MD  Subjective: Still febrile. SOB better  Assessment/Plan: Sepsis  -secondary to pyelonephritis  -Urine culture positive for Klebsiella, continue with IV Rocephin-overall better, but unfortunately continues to have fever.See below  Pyelonephritis  - On admission-presented with fever dysuria. Urinalysis consistent with UTI, started on IV antibiotics. Urine culture positive for Klebsiella, that is pansensitive except for Ancef and ampicillin. Currently on day 6 of antimicrobial therapy-but on day 3 of Rocephin. Unfortunately, continues to have fever despite being on appropriate antibiotics, do not see any other foci of infection evident at this time.Renal ultrasound negative for abscess/pyonephroses. ?Drug fever-will await ID eval-however will get a CT Abd, repeat Blood culture on 5/28  Acute diastolic heart failure - Improving, still with mild volume overload-we'll give one more dose of Lasix today. Echocardiogram confirms diastolic heart failure with preserved EF.  Thrombocytopenia  -likely secondary to acute illness  -platelet seems to be slowly improving-check CBC periodically  CAD and Hyperlipidemia  -Stable, with no chest pain  -on atorvastatin   Depression  -Stable no suicidal or homicidal ideation.  -Continue home medications.   Essential tremors  -Continue gabapentin   History of pulmonary embolism  -coumadin, continue per pharmacy. Per wife there is a strong family hx of death from blood clots. The patient coded with his PE in 1990s   CKD-III  -Creatinine close to usual baseline.  -Monitor renal function periodically  Disposition: Remain inpatient  DVT Prophylaxis: Coumadin  Code Status: Full code   Family Communication Spouse at bedside  Procedures:  None  CONSULTS:   ID  Time spent 40 minutes-which includes 50% of the time with face-to-face with patient/ family and coordinating care related to the above assessment and plan.    MEDICATIONS: Scheduled Meds: . atorvastatin  40 mg Oral QHS  . cefTRIAXone (ROCEPHIN)  IV  2 g Intravenous Daily  . cholecalciferol  1,000 Units Oral Daily  . fenofibrate  160 mg Oral Daily  . furosemide  40 mg Intravenous Once  . gabapentin  100 mg Oral TID  . loratadine  10 mg Oral Daily  . oxybutynin  5 mg Oral BID  . pantoprazole  40 mg Oral Daily  . venlafaxine  75 mg Oral Daily  . vitamin B-12  1,000 mcg Oral Daily  . Warfarin - Pharmacist Dosing Inpatient   Does not apply q1800   Continuous Infusions:  PRN Meds:.acetaminophen, acetaminophen, albuterol  Antibiotics: Anti-infectives   Start     Dose/Rate Route Frequency Ordered Stop   06/01/14 0830  cefTRIAXone (ROCEPHIN) 2 g in dextrose 5 % 50 mL IVPB     2 g 100 mL/hr over 30 Minutes Intravenous Daily 06/01/14 0723     05/31/14 2200  levofloxacin (LEVAQUIN) IVPB 500 mg  Status:  Discontinued    Comments:  Levaquin 500 mg IV q48h for CrCl < 50 mL/min   500 mg 100 mL/hr over 60 Minutes Intravenous Every 48 hours 05/29/14 2251 05/30/14 1330   05/30/14 1800  cefTRIAXone (ROCEPHIN) 1 g in dextrose 5 % 50 mL IVPB  Status:  Discontinued     1 g 100 mL/hr over 30 Minutes Intravenous Every 24 hours 05/29/14 2232 05/29/14 2249   05/30/14 1400  imipenem-cilastatin (PRIMAXIN) 250 mg in sodium chloride 0.9 % 100 mL IVPB  Status:  Discontinued  250 mg 200 mL/hr over 30 Minutes Intravenous Every 6 hours 05/30/14 1354 06/01/14 0723   05/29/14 2359  levofloxacin (LEVAQUIN) IVPB 500 mg     500 mg 100 mL/hr over 60 Minutes Intravenous  Once 05/29/14 2251 05/30/14 0004   05/29/14 2230  cefTRIAXone (ROCEPHIN) 1 g in dextrose 5 % 50 mL IVPB  Status:  Discontinued     1 g 100 mL/hr over 30 Minutes Intravenous Every 24 hours 05/29/14 2228 05/29/14 2231   05/29/14 1730   cefTRIAXone (ROCEPHIN) 1 g in dextrose 5 % 50 mL IVPB     1 g 100 mL/hr over 30 Minutes Intravenous  Once 05/29/14 1729 05/29/14 1819       PHYSICAL EXAM: Vital signs in last 24 hours: Filed Vitals:   06/03/14 0711 06/03/14 0745 06/03/14 0805 06/03/14 0907  BP: 126/42     Pulse: 70     Temp: 100.4 F (38 C) 101.4 F (38.6 C) 100.4 F (38 C) 100.8 F (38.2 C)  TempSrc: Oral     Resp: 17     Height:      Weight: 79.561 kg (175 lb 6.4 oz)     SpO2: 93%       Weight change:  Filed Weights   06/01/14 0519 06/02/14 0539 06/03/14 0711  Weight: 78.109 kg (172 lb 3.2 oz) 78.109 kg (172 lb 3.2 oz) 79.561 kg (175 lb 6.4 oz)   Body mass index is 29.19 kg/(m^2).   Gen Exam: Awake and alert with clear speech.   Neck: Supple, No JVD.   Chest: B/L Clear-few bibasilar rales CVS: S1 S2 Regular, no murmurs.  Abdomen: soft, BS +, non tender, non distended.  Extremities: no edema, lower extremities warm to touch. Neurologic: Non Focal.   Skin: No Rash.   Wounds: N/A.   Intake/Output from previous day:  Intake/Output Summary (Last 24 hours) at 06/03/14 1030 Last data filed at 06/03/14 0915  Gross per 24 hour  Intake    680 ml  Output    550 ml  Net    130 ml     LAB RESULTS: CBC  Recent Labs Lab 05/29/14 1705 05/30/14 0752 05/31/14 0500 06/01/14 0720 06/03/14 0650  WBC 10.4 10.4 6.1 4.7 7.0  HGB 12.3* 10.7* 9.9* 10.4* 10.4*  HCT 36.8* 31.7* 30.0* 31.5* 30.7*  PLT 89* 66* 61* 67* 87*  MCV 106.7* 107.8* 107.1* 109.8* 105.1*  MCH 35.7* 36.4* 35.4* 36.2* 35.6*  MCHC 33.4 33.8 33.0 33.0 33.9  RDW 14.4 14.8 14.9 14.8 14.5  LYMPHSABS 0.4*  --   --   --   --   MONOABS 0.2  --   --   --   --   EOSABS 0.0  --   --   --   --   BASOSABS 0.0  --   --   --   --     Chemistries   Recent Labs Lab 05/29/14 1705 05/30/14 0752 05/31/14 0500 06/03/14 0650  NA 138 140 142 142  K 4.1 4.3 4.2 3.8  CL 102 109 113* 105  CO2 23 22 18* 24  GLUCOSE 96 90 107* 107*  BUN 30* 30*  31* 26*  CREATININE 1.83* 1.73* 1.60* 1.43*  CALCIUM 9.7 8.1* 8.0* 8.6    CBG: No results found for this basename: GLUCAP,  in the last 168 hours  GFR Estimated Creatinine Clearance: 40 ml/min (by C-G formula based on Cr of 1.43).  Coagulation profile  Recent Labs Lab  05/30/14 0752 05/31/14 0500 06/01/14 0720 06/02/14 0950 06/03/14 0650  INR 2.31* 2.17* 1.88* 2.56* 3.06*    Cardiac Enzymes No results found for this basename: CK, CKMB, TROPONINI, MYOGLOBIN,  in the last 168 hours  No components found with this basename: POCBNP,  No results found for this basename: DDIMER,  in the last 72 hours No results found for this basename: HGBA1C,  in the last 72 hours No results found for this basename: CHOL, HDL, LDLCALC, TRIG, CHOLHDL, LDLDIRECT,  in the last 72 hours No results found for this basename: TSH, T4TOTAL, FREET3, T3FREE, THYROIDAB,  in the last 72 hours No results found for this basename: VITAMINB12, FOLATE, FERRITIN, TIBC, IRON, RETICCTPCT,  in the last 72 hours No results found for this basename: LIPASE, AMYLASE,  in the last 72 hours  Urine Studies No results found for this basename: UACOL, UAPR, USPG, UPH, UTP, UGL, UKET, UBIL, UHGB, UNIT, UROB, ULEU, UEPI, UWBC, URBC, UBAC, CAST, CRYS, UCOM, BILUA,  in the last 72 hours  MICROBIOLOGY: Recent Results (from the past 240 hour(s))  CULTURE, BLOOD (ROUTINE X 2)     Status: None   Collection Time    05/29/14  5:20 PM      Result Value Ref Range Status   Specimen Description BLOOD RIGHT ANTECUBITAL   Final   Special Requests BOTTLES DRAWN AEROBIC ONLY 10CC   Final   Culture  Setup Time     Final   Value: 05/29/2014 22:30     Performed at [facility]   Culture     Final   Value:        BLOOD CULTURE RECEIVED NO GROWTH TO DATE CULTURE WILL BE HELD FOR 5 DAYS BEFORE ISSUING A FINAL NEGATIVE REPORT     Performed at [facility]   Report Status PENDING   Incomplete  CULTURE, BLOOD (ROUTINE X 2)      Status: None   Collection Time    05/29/14  5:21 PM      Result Value Ref Range Status   Specimen Description BLOOD LEFT ANTECUBITAL   Final   Special Requests BOTTLES DRAWN AEROBIC ONLY 5CC   Final   Culture  Setup Time     Final   Value: 05/29/2014 22:31     Performed at [facility]   Culture     Final   Value:        BLOOD CULTURE RECEIVED NO GROWTH TO DATE CULTURE WILL BE HELD FOR 5 DAYS BEFORE ISSUING A FINAL NEGATIVE REPORT     Performed at [facility]   Report Status PENDING   Incomplete  URINE CULTURE     Status: None   Collection Time    05/29/14  5:53 PM      Result Value Ref Range Status   Specimen Description URINE, CATHETERIZED   Final   Special Requests NONE   Final   Culture  Setup Time     Final   Value: 05/30/2014 01:49     Performed at [place]     Final   Value: >=100,000 COLONIES/ML     Performed at [facility]   Culture     Final   Value: KLEBSIELLA OXYTOCA     Performed at [facility]   Report Status 05/31/2014 FINAL   Final   Organism ID, Bacteria KLEBSIELLA OXYTOCA   Final    RADIOLOGY STUDIES/RESULTS: Dg Chest 2 View  06/02/2014   CLINICAL DATA:  Cough and shortness of breath.  EXAM: CHEST  2 VIEW  COMPARISON:  05/29/2014 and 03/21/2013.  FINDINGS: Trachea is midline. Heart size stable. There is mixed interstitial and airspace disease, basilar dependent, new from 05/29/2014. Small bilateral effusions. Flowing anterior osteophytosis in the thoracic spine.  IMPRESSION: New mixed interstitial and airspace disease, basilar dependent, with bilateral effusions. Findings may be due to congestive heart failure. Pneumonia not excluded.   Electronically Signed   By: Lorin Picket M.D.   On: 06/02/2014 13:07   Dg Chest 2 View  05/29/2014   CLINICAL DATA:  Fever, UTI  EXAM: CHEST  2 VIEW  COMPARISON:  03/21/2013  FINDINGS: Lungs are essentially clear. No focal consolidation. No pleural effusion or  pneumothorax.  The heart is normal in size.  Calcified right perihilar nodes.  Degenerative changes of the visualized thoracolumbar spine.  IMPRESSION: No evidence of acute cardiopulmonary disease.   Electronically Signed   By: Julian Hy M.D.   On: 05/29/2014 19:18   US Renal  06/01/2014   CLINICAL DATA:  Pyelonephritis. Persistent fever. Question of abscess. Prior cystoscopy. Pre diabetes.  EXAM: RENAL/URINARY TRACT ULTRASOUND COMPLETE  COMPARISON:  Ultrasound of the abdomen on 08/23/2010  FINDINGS: Right Kidney:  Length: 11.1 cm. There is renal parenchymal thinning. No hydronephrosis. Renal echogenicity is normal. No focal mass.  Left Kidney:  Length: 11.4 cm in length. Renal parenchymal thinning. No hydronephrosis. Cyst is 3.4 x 2.9 x 3.0 cm and upper pole region.  Bladder:  Appears normal for degree of bladder distention.  IMPRESSION: 1. No hydronephrosis. 2. Left renal cyst. 3. No abscess identified.   Electronically Signed   By: Shon Hale M.D.   On: 06/01/2014 10:52    Oren Binet, MD  [facility] Pager:[phone]  If 7PM-7AM, please contact night-coverage [email or web address] Password [email or web address] 06/03/2014, 10:30 AM   LOS: 5 days   **Disclaimer: This note may have been dictated with voice recognition software. Similar sounding words can inadvertently be transcribed and this note may contain transcription errors which may not have been corrected upon publication of note.**

## 2014-06-04 DIAGNOSIS — R509 Fever, unspecified: Secondary | ICD-10-CM

## 2014-06-04 DIAGNOSIS — J90 Pleural effusion, not elsewhere classified: Secondary | ICD-10-CM

## 2014-06-04 LAB — BASIC METABOLIC PANEL
Anion gap: 11 (ref 5–15)
BUN: 26 mg/dL — ABNORMAL HIGH (ref 6–23)
CO2: 26 mEq/L (ref 19–32)
CREATININE: 1.34 mg/dL (ref 0.50–1.35)
Calcium: 8.4 mg/dL (ref 8.4–10.5)
Chloride: 102 mEq/L (ref 96–112)
GFR, EST AFRICAN AMERICAN: 56 mL/min — AB (ref >90)
GFR, EST NON AFRICAN AMERICAN: 48 mL/min — AB (ref >90)
Glucose, Bld: 97 mg/dL (ref 70–99)
POTASSIUM: 4.1 meq/L (ref 3.7–5.3)
Sodium: 139 mEq/L (ref 137–147)

## 2014-06-04 LAB — CULTURE, BLOOD (ROUTINE X 2)
CULTURE: NO GROWTH
Culture: NO GROWTH

## 2014-06-04 LAB — PROTIME-INR
INR: 3.46 — AB (ref 0.00–1.49)
PROTHROMBIN TIME: 34.8 s — AB (ref 11.6–15.2)

## 2014-06-04 MED ORDER — ACETAMINOPHEN 500 MG PO TABS
1000.0000 mg | ORAL_TABLET | Freq: Once | ORAL | Status: AC
  Administered 2014-06-04: 1000 mg via ORAL
  Filled 2014-06-04: qty 2

## 2014-06-04 MED ORDER — BOOST / RESOURCE BREEZE PO LIQD
1.0000 | Freq: Three times a day (TID) | ORAL | Status: DC
  Administered 2014-06-04: 1 via ORAL
  Administered 2014-06-04: 15:00:00 via ORAL
  Administered 2014-06-05 – 2014-06-07 (×8): 1 via ORAL
  Administered 2014-06-08: 12:00:00 via ORAL
  Filled 2014-06-04: qty 1

## 2014-06-04 MED ORDER — HYDROCORTISONE 1 % EX CREA
TOPICAL_CREAM | Freq: Two times a day (BID) | CUTANEOUS | Status: DC | PRN
  Administered 2014-06-04 (×2): via TOPICAL
  Administered 2014-06-05 – 2014-06-07 (×2): 1 via TOPICAL
  Administered 2014-06-08: 10:00:00 via TOPICAL
  Filled 2014-06-04: qty 28

## 2014-06-04 MED ORDER — LEVOFLOXACIN 750 MG PO TABS
750.0000 mg | ORAL_TABLET | ORAL | Status: DC
  Administered 2014-06-05: 750 mg via ORAL
  Filled 2014-06-04 (×2): qty 1

## 2014-06-04 MED ORDER — LEVOFLOXACIN IN D5W 750 MG/150ML IV SOLN
750.0000 mg | INTRAVENOUS | Status: DC
Start: 2014-06-05 — End: 2014-06-04
  Filled 2014-06-04: qty 150

## 2014-06-04 MED ORDER — FUROSEMIDE 10 MG/ML IJ SOLN
20.0000 mg | Freq: Once | INTRAMUSCULAR | Status: AC
  Administered 2014-06-04: 20 mg via INTRAVENOUS
  Filled 2014-06-04: qty 2

## 2014-06-04 NOTE — Progress Notes (Signed)
PATIENT DETAILS Name: [name] Age: 80 y.o. Sex: male Date of Birth: 06-Jan-1931 Admit Date: 05/29/2014 Admitting Physician Ivor Costa, MD [id number] Danise Mina, MD  Subjective: Still febrile-Complaining back pain-developed after he came back from CT Chest/abdomen.  Assessment/Plan: Sepsis  -secondary to pyelonephritis  -Urine culture positive for Klebsiella, continue with IV Rocephin-overall better, but unfortunately continues to have fever.See below  Pyelonephritis  - On admission-presented with fever dysuria. Urinalysis consistent with UTI, started on IV antibiotics. Urine culture positive for Klebsiella, that is pansensitive except for Ancef and ampicillin. Currently on day 7 of antimicrobial therapy-but on day 4 of Rocephin. Unfortunately, continues to have fever despite being on appropriate antibiotics. CT Chest shows b/l pleural effusion/atelectasis-likely from CHF-but given persistent fever-?thoracocentesis-suspect will be a low yield.Do not see any other foci of infection evident at this time-however now complaining of back pain-suspect that this is musculoskeleta. Renal ultrasound/CT Abd negative for abscess/pyonephroses. ?Drug fever-?monitor off Abx or change Abx-will await ID folllow up . Repeat Blood culture on [id number]  Acute diastolic heart failure - Improving, still with mild volume overload-we'll give one more dose of Lasix today. Echocardiogram confirms diastolic heart failure with preserved EF.  Thrombocytopenia  -likely secondary to acute illness  -platelet seems to be slowly improving-check CBC periodically  CAD and Hyperlipidemia  -Stable, with no chest pain  -on atorvastatin   Depression  -Stable no suicidal or homicidal ideation.  -Continue home medications.   Essential tremors  -Continue gabapentin   History of pulmonary embolism  -coumadin, continue per pharmacy. Per wife there is a strong family hx of death from blood clots. The  patient coded with his PE in 1990s   CKD-III  -Creatinine improved -Monitor renal function periodically  Disposition: Remain inpatient  DVT Prophylaxis: Coumadin  Code Status: Full code   Family Communication Spouse at bedside  Procedures:  None  CONSULTS:  ID  MEDICATIONS: Scheduled Meds: . atorvastatin  40 mg Oral QHS  . cefTRIAXone (ROCEPHIN)  IV  2 g Intravenous Daily  . cholecalciferol  1,000 Units Oral Daily  . fenofibrate  160 mg Oral Daily  . gabapentin  100 mg Oral TID  . loratadine  10 mg Oral Daily  . oxybutynin  5 mg Oral BID  . pantoprazole  40 mg Oral Daily  . venlafaxine  75 mg Oral Daily  . vitamin B-12  1,000 mcg Oral Daily  . Warfarin - Pharmacist Dosing Inpatient   Does not apply q1800   Continuous Infusions:  PRN Meds:.acetaminophen, acetaminophen, albuterol, hydrocortisone cream, traMADol  Antibiotics: Anti-infectives   Start     Dose/Rate Route Frequency Ordered Stop   06/01/14 0830  cefTRIAXone (ROCEPHIN) 2 g in dextrose 5 % 50 mL IVPB     2 g 100 mL/hr over 30 Minutes Intravenous Daily 06/01/14 0723     05/31/14 2200  levofloxacin (LEVAQUIN) IVPB 500 mg  Status:  Discontinued    Comments:  Levaquin 500 mg IV q48h for CrCl < 50 mL/min   500 mg 100 mL/hr over 60 Minutes Intravenous Every 48 hours 05/29/14 2251 05/30/14 1330   05/30/14 1800  cefTRIAXone (ROCEPHIN) 1 g in dextrose 5 % 50 mL IVPB  Status:  Discontinued     1 g 100 mL/hr over 30 Minutes Intravenous Every 24 hours 05/29/14 2232 05/29/14 2249   05/30/14 1400  imipenem-cilastatin (PRIMAXIN) 250 mg in sodium chloride 0.9 % 100 mL IVPB  Status:  Discontinued  250 mg 200 mL/hr over 30 Minutes Intravenous Every 6 hours 05/30/14 1354 06/01/14 0723   05/29/14 2359  levofloxacin (LEVAQUIN) IVPB 500 mg     500 mg 100 mL/hr over 60 Minutes Intravenous  Once 05/29/14 2251 05/30/14 0004   05/29/14 2230  cefTRIAXone (ROCEPHIN) 1 g in dextrose 5 % 50 mL IVPB  Status:  Discontinued       1 g 100 mL/hr over 30 Minutes Intravenous Every 24 hours 05/29/14 2228 05/29/14 2231   05/29/14 1730  cefTRIAXone (ROCEPHIN) 1 g in dextrose 5 % 50 mL IVPB     1 g 100 mL/hr over 30 Minutes Intravenous  Once 05/29/14 1729 05/29/14 1819       PHYSICAL EXAM: Vital signs in last 24 hours: Filed Vitals:   06/03/14 2202 06/03/14 2325 06/04/14 0520 06/04/14 0616  BP: 116/44  110/51   Pulse: 73  72   Temp: 101.8 F (38.8 C) 98.2 F (36.8 C) 100 F (37.8 C)   TempSrc: Oral Oral Oral   Resp: 17  17   Height:      Weight:   76.839 kg (169 lb 6.4 oz)   SpO2: 91%  90% 92%    Weight change:  Filed Weights   06/02/14 0539 06/03/14 0711 06/04/14 0520  Weight: 78.109 kg (172 lb 3.2 oz) 79.561 kg (175 lb 6.4 oz) 76.839 kg (169 lb 6.4 oz)   Body mass index is 28.19 kg/(m^2).   Gen Exam: Awake and alert with clear speech.   Neck: Supple, No JVD.   Chest: B/L Clear CVS: S1 S2 Regular, no murmurs.  Abdomen: soft, BS +, non tender, non distended.  Extremities: no edema, lower extremities warm to touch. Neurologic: Non Focal.   Skin: No Rash.   Wounds: N/A.   Intake/Output from previous day:  Intake/Output Summary (Last 24 hours) at 06/04/14 1017 Last data filed at 06/04/14 0525  Gross per 24 hour  Intake    800 ml  Output   2600 ml  Net  -1800 ml     LAB RESULTS: CBC  Recent Labs Lab 05/29/14 1705 05/30/14 0752 05/31/14 0500 06/01/14 0720 06/03/14 0650  WBC 10.4 10.4 6.1 4.7 7.0  HGB 12.3* 10.7* 9.9* 10.4* 10.4*  HCT 36.8* 31.7* 30.0* 31.5* 30.7*  PLT 89* 66* 61* 67* 87*  MCV 106.7* 107.8* 107.1* 109.8* 105.1*  MCH 35.7* 36.4* 35.4* 36.2* 35.6*  MCHC 33.4 33.8 33.0 33.0 33.9  RDW 14.4 14.8 14.9 14.8 14.5  LYMPHSABS 0.4*  --   --   --   --   MONOABS 0.2  --   --   --   --   EOSABS 0.0  --   --   --   --   BASOSABS 0.0  --   --   --   --     Chemistries   Recent Labs Lab 05/29/14 1705 05/30/14 0752 05/31/14 0500 06/03/14 0650 06/04/14 0548  NA 138 140  142 142 139  K 4.1 4.3 4.2 3.8 4.1  CL 102 109 113* 105 102  CO2 23 22 18* 24 26  GLUCOSE 96 90 107* 107* 97  BUN 30* 30* 31* 26* 26*  CREATININE 1.83* 1.73* 1.60* 1.43* 1.34  CALCIUM 9.7 8.1* 8.0* 8.6 8.4    CBG: No results found for this basename: GLUCAP,  in the last 168 hours  GFR Estimated Creatinine Clearance: 42 ml/min (by C-G formula based on Cr of 1.34).  Coagulation profile  Recent Labs Lab 05/31/14 0500 06/01/14 0720 06/02/14 0950 06/03/14 0650 06/04/14 0548  INR 2.17* 1.88* 2.56* 3.06* 3.46*    Cardiac Enzymes No results found for this basename: CK, CKMB, TROPONINI, MYOGLOBIN,  in the last 168 hours  No components found with this basename: POCBNP,  No results found for this basename: DDIMER,  in the last 72 hours No results found for this basename: HGBA1C,  in the last 72 hours No results found for this basename: CHOL, HDL, LDLCALC, TRIG, CHOLHDL, LDLDIRECT,  in the last 72 hours No results found for this basename: TSH, T4TOTAL, FREET3, T3FREE, THYROIDAB,  in the last 72 hours No results found for this basename: VITAMINB12, FOLATE, FERRITIN, TIBC, IRON, RETICCTPCT,  in the last 72 hours No results found for this basename: LIPASE, AMYLASE,  in the last 72 hours  Urine Studies No results found for this basename: UACOL, UAPR, USPG, UPH, UTP, UGL, UKET, UBIL, UHGB, UNIT, UROB, ULEU, UEPI, UWBC, URBC, UBAC, CAST, CRYS, UCOM, BILUA,  in the last 72 hours  MICROBIOLOGY: Recent Results (from the past 240 hour(s))  CULTURE, BLOOD (ROUTINE X 2)     Status: None   Collection Time    05/29/14  5:20 PM      Result Value Ref Range Status   Specimen Description BLOOD RIGHT ANTECUBITAL   Final   Special Requests BOTTLES DRAWN AEROBIC ONLY 10CC   Final   Culture  Setup Time     Final   Value: 05/29/2014 22:30     Performed at [facility]   Culture     Final   Value:        BLOOD CULTURE RECEIVED NO GROWTH TO DATE CULTURE WILL BE HELD FOR 5 DAYS BEFORE ISSUING  A FINAL NEGATIVE REPORT     Performed at [facility]   Report Status PENDING   Incomplete  CULTURE, BLOOD (ROUTINE X 2)     Status: None   Collection Time    05/29/14  5:21 PM      Result Value Ref Range Status   Specimen Description BLOOD LEFT ANTECUBITAL   Final   Special Requests BOTTLES DRAWN AEROBIC ONLY 5CC   Final   Culture  Setup Time     Final   Value: 05/29/2014 22:31     Performed at [facility]   Culture     Final   Value:        BLOOD CULTURE RECEIVED NO GROWTH TO DATE CULTURE WILL BE HELD FOR 5 DAYS BEFORE ISSUING A FINAL NEGATIVE REPORT     Performed at [facility]   Report Status PENDING   Incomplete  URINE CULTURE     Status: None   Collection Time    05/29/14  5:53 PM      Result Value Ref Range Status   Specimen Description URINE, CATHETERIZED   Final   Special Requests NONE   Final   Culture  Setup Time     Final   Value: 05/30/2014 01:49     Performed at [place]     Final   Value: >=100,000 COLONIES/ML     Performed at [facility]   Culture     Final   Value: KLEBSIELLA OXYTOCA     Performed at [facility]   Report Status 05/31/2014 FINAL   Final   Organism ID, Bacteria KLEBSIELLA OXYTOCA   Final    RADIOLOGY STUDIES/RESULTS: Dg  Chest 2 View  06/02/2014   CLINICAL DATA:  Cough and shortness of breath.  EXAM: CHEST  2 VIEW  COMPARISON:  05/29/2014 and 03/21/2013.  FINDINGS: Trachea is midline. Heart size stable. There is mixed interstitial and airspace disease, basilar dependent, new from 05/29/2014. Small bilateral effusions. Flowing anterior osteophytosis in the thoracic spine.  IMPRESSION: New mixed interstitial and airspace disease, basilar dependent, with bilateral effusions. Findings may be due to congestive heart failure. Pneumonia not excluded.   Electronically Signed   By: Lorin Picket M.D.   On: 06/02/2014 13:07   Dg Chest 2 View  05/29/2014   CLINICAL DATA:  Fever, UTI   EXAM: CHEST  2 VIEW  COMPARISON:  03/21/2013  FINDINGS: Lungs are essentially clear. No focal consolidation. No pleural effusion or pneumothorax.  The heart is normal in size.  Calcified right perihilar nodes.  Degenerative changes of the visualized thoracolumbar spine.  IMPRESSION: No evidence of acute cardiopulmonary disease.   Electronically Signed   By: Julian Hy M.D.   On: 05/29/2014 19:18   US Renal  06/01/2014   CLINICAL DATA:  Pyelonephritis. Persistent fever. Question of abscess. Prior cystoscopy. Pre diabetes.  EXAM: RENAL/URINARY TRACT ULTRASOUND COMPLETE  COMPARISON:  Ultrasound of the abdomen on 08/23/2010  FINDINGS: Right Kidney:  Length: 11.1 cm. There is renal parenchymal thinning. No hydronephrosis. Renal echogenicity is normal. No focal mass.  Left Kidney:  Length: 11.4 cm in length. Renal parenchymal thinning. No hydronephrosis. Cyst is 3.4 x 2.9 x 3.0 cm and upper pole region.  Bladder:  Appears normal for degree of bladder distention.  IMPRESSION: 1. No hydronephrosis. 2. Left renal cyst. 3. No abscess identified.   Electronically Signed   By: Shon Hale M.D.   On: 06/01/2014 10:52    Oren Binet, MD  [facility] Pager:[phone]  If 7PM-7AM, please contact night-coverage [email or web address] Password [email or web address] 06/04/2014, 10:17 AM   LOS: 6 days   **Disclaimer: This note may have been dictated with voice recognition software. Similar sounding words can inadvertently be transcribed and this note may contain transcription errors which may not have been corrected upon publication of note.**

## 2014-06-04 NOTE — Progress Notes (Signed)
80yo male with persistent fevers on ceftriaxone for pyelonephritis, to change to PO Levaquin.  Will start Levaquin 750mg  PO Q48H for CrCl ~40 ml/min and monitor CBC and Cx.  Wynona Neat, PharmD, BCPS 06/04/2014 11:51 PM

## 2014-06-04 NOTE — Telephone Encounter (Signed)
Pt seen today in hospital.

## 2014-06-04 NOTE — Progress Notes (Signed)
ANTICOAGULATION CONSULT NOTE - Follow Up Consult  Pharmacy Consult for warfarin Indication: Hx of PE  Allergies  Allergen Reactions  . Lactose Intolerance (Gi)   . Nizatidine     REACTION: Rash (Axid)  . Penicillins     REACTION: Rash  . Sulfadiazine     REACTION: Hives    Patient Measurements: Height: 5\' 5"  (165.1 cm) Weight: 169 lb 6.4 oz (76.839 kg) IBW/kg (Calculated) : 61.5  Vital Signs: Temp: 100 F (37.8 C) (09/27 0520) Temp src: Oral (09/27 0520) BP: 110/51 mmHg (09/27 0520) Pulse Rate: 72 (09/27 0520)  Labs:  Recent Labs  06/02/14 0950 06/03/14 0650 06/04/14 0548  HGB  --  10.4*  --   HCT  --  30.7*  --   PLT  --  87*  --   LABPROT 27.5* 31.6* 34.8*  INR 2.56* 3.06* 3.46*  CREATININE  --  1.43* 1.34  CKTOTAL  --  55  --     Estimated Creatinine Clearance: 42 ml/min (by C-G formula based on Cr of 1.34).  Assessment: 41 yoM on coumadin for history of PE. INR is supratherapeutic today, continuing to trend up from 2.56 to 3.06 to 3.46 (2 mg X 2 days). INR may be higher due to antibiotic use and/or current acute illness. Due to rather large jump in INR, will hold warfarin dose X 1 tonight.  Goal of Therapy:  INR 2-3 Monitor platelets by anticoagulation protocol: Yes   Plan:  1) Hold Coumadin dose tonight; Check INR in AM  Theron Arista, PharmD Clinical Pharmacist - Resident Pager: [phone] 9/27/20158:04 AM

## 2014-06-04 NOTE — Progress Notes (Signed)
[place] for Infectious Disease  Date of Admission:  05/29/2014  Antibiotics: ceftriaxone  Subjective: Still some fever  Objective: Temp:  [98.2 F (36.8 C)-101.8 F (38.8 C)] 100 F (37.8 C) (09/27 0520) Pulse Rate:  [63-73] 72 (09/27 0520) Resp:  [17-18] 17 (09/27 0520) BP: (110-124)/(44-51) 110/51 mmHg (09/27 0520) SpO2:  [90 %-98 %] 92 % (09/27 0616) Weight:  [169 lb 6.4 oz (76.839 kg)] 169 lb 6.4 oz (76.839 kg) (09/27 0520)  General: awake, in chair, nad Skin: some facial erythema Lungs: CTA B, diminished at bases Cor: RRR Abdomen: soft, nt   Lab Results Lab Results  Component Value Date   WBC 7.0 06/03/2014   HGB 10.4* 06/03/2014   HCT 30.7* 06/03/2014   MCV 105.1* 06/03/2014   PLT 87* 06/03/2014    Lab Results  Component Value Date   CREATININE 1.34 06/04/2014   BUN 26* 06/04/2014   NA 139 06/04/2014   K 4.1 06/04/2014   CL 102 06/04/2014   CO2 26 06/04/2014    Lab Results  Component Value Date   ALT 42 06/03/2014   AST 73* 06/03/2014   ALKPHOS 65 06/03/2014   BILITOT 0.8 06/03/2014      Microbiology: Recent Results (from the past 240 hour(s))  CULTURE, BLOOD (ROUTINE X 2)     Status: None   Collection Time    05/29/14  5:20 PM      Result Value Ref Range Status   Specimen Description BLOOD RIGHT ANTECUBITAL   Final   Special Requests BOTTLES DRAWN AEROBIC ONLY 10CC   Final   Culture  Setup Time     Final   Value: 05/29/2014 22:30     Performed at [facility]   Culture     Final   Value:        BLOOD CULTURE RECEIVED NO GROWTH TO DATE CULTURE WILL BE HELD FOR 5 DAYS BEFORE ISSUING A FINAL NEGATIVE REPORT     Performed at [facility]   Report Status PENDING   Incomplete  CULTURE, BLOOD (ROUTINE X 2)     Status: None   Collection Time    05/29/14  5:21 PM      Result Value Ref Range Status   Specimen Description BLOOD LEFT ANTECUBITAL   Final   Special Requests BOTTLES DRAWN AEROBIC ONLY 5CC   Final   Culture  Setup Time      Final   Value: 05/29/2014 22:31     Performed at [facility]   Culture     Final   Value:        BLOOD CULTURE RECEIVED NO GROWTH TO DATE CULTURE WILL BE HELD FOR 5 DAYS BEFORE ISSUING A FINAL NEGATIVE REPORT     Performed at [facility]   Report Status PENDING   Incomplete  URINE CULTURE     Status: None   Collection Time    05/29/14  5:53 PM      Result Value Ref Range Status   Specimen Description URINE, CATHETERIZED   Final   Special Requests NONE   Final   Culture  Setup Time     Final   Value: 05/30/2014 01:49     Performed at [place]     Final   Value: >=100,000 COLONIES/ML     Performed at [facility]   Culture     Final   Value: KLEBSIELLA OXYTOCA  Performed at [facility]   Report Status 05/31/2014 FINAL   Final   Organism ID, Bacteria KLEBSIELLA OXYTOCA   Final    Studies/Results: Ct Abdomen Pelvis Wo Contrast  06/03/2014   CLINICAL DATA:  Fever, urinary incontinence a renal insufficiency.  EXAM: CT CHEST, ABDOMEN AND PELVIS WITHOUT CONTRAST  TECHNIQUE: Multidetector CT imaging of the chest, abdomen and pelvis was performed following the standard protocol without IV contrast.  COMPARISON:  CT scan of July 11, 2010.  FINDINGS: CT CHEST FINDINGS  No pneumothorax is noted. Moderate bilateral pleural effusions are noted with adjacent subsegmental atelectasis. Calcified right hilar adenopathy is noted. Coronary artery calcifications are noted consistent with coronary artery disease.  CT ABDOMEN AND PELVIS FINDINGS  Minimal cholelithiasis is noted. No focal abnormality is noted in the liver or pancreas on these unenhanced images. Small splenic granulomata are noted. Adrenal glands appear normal. No hydronephrosis or renal obstruction is noted. No renal or ureteral calculi are noted. Mild urinary bladder distention is noted. 2.9 cm simple cyst arises posteriorly from the left kidney. Atherosclerotic calcifications  of abdominal aorta are noted without aneurysm formation. There is no evidence of bowel obstruction. No abnormal fluid collection is noted. No significant adenopathy is noted.  IMPRESSION: Moderate bilateral pleural effusions are noted with adjacent subsegmental atelectasis.  Calcified right hilar adenopathy is noted as well as splenic granulomata suggesting prior granulomatous disease.  No hydronephrosis or renal obstruction is noted. No renal or ureteral calculi are noted.  Minimal cholelithiasis is noted.  Coronary artery calcifications are noted consistent with coronary artery disease.   Electronically Signed   By: Sabino Dick M.D.   On: 06/03/2014 17:17   Dg Chest 2 View  06/02/2014   CLINICAL DATA:  Cough and shortness of breath.  EXAM: CHEST  2 VIEW  COMPARISON:  05/29/2014 and 03/21/2013.  FINDINGS: Trachea is midline. Heart size stable. There is mixed interstitial and airspace disease, basilar dependent, new from 05/29/2014. Small bilateral effusions. Flowing anterior osteophytosis in the thoracic spine.  IMPRESSION: New mixed interstitial and airspace disease, basilar dependent, with bilateral effusions. Findings may be due to congestive heart failure. Pneumonia not excluded.   Electronically Signed   By: Lorin Picket M.D.   On: 06/02/2014 13:07   Ct Chest Wo Contrast  06/03/2014   CLINICAL DATA:  Fever, urinary incontinence a renal insufficiency.  EXAM: CT CHEST, ABDOMEN AND PELVIS WITHOUT CONTRAST  TECHNIQUE: Multidetector CT imaging of the chest, abdomen and pelvis was performed following the standard protocol without IV contrast.  COMPARISON:  CT scan of July 11, 2010.  FINDINGS: CT CHEST FINDINGS  No pneumothorax is noted. Moderate bilateral pleural effusions are noted with adjacent subsegmental atelectasis. Calcified right hilar adenopathy is noted. Coronary artery calcifications are noted consistent with coronary artery disease.  CT ABDOMEN AND PELVIS FINDINGS  Minimal cholelithiasis is  noted. No focal abnormality is noted in the liver or pancreas on these unenhanced images. Small splenic granulomata are noted. Adrenal glands appear normal. No hydronephrosis or renal obstruction is noted. No renal or ureteral calculi are noted. Mild urinary bladder distention is noted. 2.9 cm simple cyst arises posteriorly from the left kidney. Atherosclerotic calcifications of abdominal aorta are noted without aneurysm formation. There is no evidence of bowel obstruction. No abnormal fluid collection is noted. No significant adenopathy is noted.  IMPRESSION: Moderate bilateral pleural effusions are noted with adjacent subsegmental atelectasis.  Calcified right hilar adenopathy is noted as well as splenic granulomata suggesting prior  granulomatous disease.  No hydronephrosis or renal obstruction is noted. No renal or ureteral calculi are noted.  Minimal cholelithiasis is noted.  Coronary artery calcifications are noted consistent with coronary artery disease.   Electronically Signed   By: Sabino Dick M.D.   On: 06/03/2014 17:17    Assessment/Plan: 1) pyelonephritis - will change to oral levaquin to see if drug fever an issue.  Other source not readily identified.  Pleural effusions likely 2/2 CHF, though could be a source.  He though clinically appears well so no indication to tap at this point.   Dr. Johnnye Sima back tomorrow  Elizebath Wever, Herbie [place], [place] [facility] [place] [email or web address] [phone] pager   [phone] cell 06/04/2014, 12:31 PM

## 2014-06-05 DIAGNOSIS — N390 Urinary tract infection, site not specified: Secondary | ICD-10-CM

## 2014-06-05 LAB — BASIC METABOLIC PANEL
Anion gap: 7 (ref 5–15)
BUN: 26 mg/dL — AB (ref 6–23)
CALCIUM: 8.3 mg/dL — AB (ref 8.4–10.5)
CO2: 30 mEq/L (ref 19–32)
CREATININE: 1.29 mg/dL (ref 0.50–1.35)
Chloride: 101 mEq/L (ref 96–112)
GFR calc Af Amer: 59 mL/min — ABNORMAL LOW (ref >90)
GFR, EST NON AFRICAN AMERICAN: 51 mL/min — AB (ref >90)
GLUCOSE: 103 mg/dL — AB (ref 70–99)
Potassium: 4.4 mEq/L (ref 3.7–5.3)
SODIUM: 138 meq/L (ref 137–147)

## 2014-06-05 LAB — CBC
HEMATOCRIT: 28.2 % — AB (ref 39.0–52.0)
Hemoglobin: 9.5 g/dL — ABNORMAL LOW (ref 13.0–17.0)
MCH: 34.8 pg — AB (ref 26.0–34.0)
MCHC: 33.7 g/dL (ref 30.0–36.0)
MCV: 103.3 fL — AB (ref 78.0–100.0)
Platelets: 116 10*3/uL — ABNORMAL LOW (ref 150–400)
RBC: 2.73 MIL/uL — ABNORMAL LOW (ref 4.22–5.81)
RDW: 14.2 % (ref 11.5–15.5)
WBC: 5.5 10*3/uL (ref 4.0–10.5)

## 2014-06-05 LAB — PROTIME-INR
INR: 2.8 — ABNORMAL HIGH (ref 0.00–1.49)
Prothrombin Time: 29.5 seconds — ABNORMAL HIGH (ref 11.6–15.2)

## 2014-06-05 MED ORDER — WARFARIN SODIUM 1 MG PO TABS
1.5000 mg | ORAL_TABLET | Freq: Once | ORAL | Status: AC
  Administered 2014-06-05: 1.5 mg via ORAL
  Filled 2014-06-05 (×2): qty 1

## 2014-06-05 NOTE — Progress Notes (Signed)
PATIENT DETAILS Name: [name] Age: 80 y.o. Sex: male Date of Birth: 25-Dec-1930 Admit Date: 05/29/2014 Admitting Physician Ivor Costa, MD VZD:GLOVFI Gutierrez, MD  Subjective: Complaining back pain-developed after he came back from CT Chest/abdomen.No fever this am!  Assessment/Plan: Sepsis  -secondary to pyelonephritis. Sepsis pathophysiology seems to have resolved  -Urine culture positive for Klebsiella, now on Levaquin-overall better, no fever this am.See below-for further discussion  Pyelonephritis  - On admission-presented with fever dysuria. Urinalysis consistent with UTI, started on IV antibiotics. Urine culture positive for Klebsiella, that is pansensitive except for Ancef and ampicillin. Currently on day 8 of antimicrobial therapy-but Day 2 of Levaquin. Was on Rocephin, now discontinued given concern for drug fever, no fever since yesterday, will continue to follow.  CT Chest shows b/l pleural effusion/atelectasis-likely from CHF-but given persistent fever-?thoracocentesis-suspect will be a low yield.Do not see any other foci of infection evident at this time-however now complaining of back pain-suspect that this is musculoskeletal-this likely is a chronic issue, patient apparently see's a Chiropracter as outpatient for back pain. Renal ultrasound/CT Abd negative for abscess/pyonephroses. ?Drug fever- Repeat Blood culture on 4/33-IRJ  Acute diastolic heart failure - Improving, still with mild volume overload-treated with IV Lasix-euvolemic-suspect can be given prn Lasix at this time. Echocardiogram confirms diastolic heart failure with preserved EF.  Thrombocytopenia  -likely secondary to acute illness  -platelet seems to be slowly improving-check CBC periodically  CAD and Hyperlipidemia  -Stable, with no chest pain  -on atorvastatin   Depression  -Stable no suicidal or homicidal ideation.  -Continue home medications.   Essential tremors  -Continue  gabapentin   History of pulmonary embolism  -coumadin, continue per pharmacy. Per wife there is a strong family hx of death from blood clots. The patient coded with his PE in 1990s   CKD-III  -Creatinine improved -Monitor renal function periodically  Disposition: Remain inpatient  DVT Prophylaxis: Coumadin  Code Status: Full code   Family Communication Spouse at bedside  Procedures:  None  CONSULTS:  ID  MEDICATIONS: Scheduled Meds: . atorvastatin  40 mg Oral QHS  . cholecalciferol  1,000 Units Oral Daily  . feeding supplement (RESOURCE BREEZE)  1 Container Oral TID BM  . fenofibrate  160 mg Oral Daily  . gabapentin  100 mg Oral TID  . levofloxacin  750 mg Oral Q48H  . loratadine  10 mg Oral Daily  . oxybutynin  5 mg Oral BID  . pantoprazole  40 mg Oral Daily  . venlafaxine  75 mg Oral Daily  . vitamin B-12  1,000 mcg Oral Daily  . Warfarin - Pharmacist Dosing Inpatient   Does not apply q1800   Continuous Infusions:  PRN Meds:.acetaminophen, acetaminophen, albuterol, hydrocortisone cream, traMADol  Antibiotics: Anti-infectives   Start     Dose/Rate Route Frequency Ordered Stop   06/05/14 0000  levofloxacin (LEVAQUIN) IVPB 750 mg  Status:  Discontinued     750 mg 100 mL/hr over 90 Minutes Intravenous Every 48 hours 06/04/14 2348 06/04/14 2350   06/05/14 0000  levofloxacin (LEVAQUIN) tablet 750 mg     750 mg Oral Every 48 hours 06/04/14 2350     06/01/14 0830  cefTRIAXone (ROCEPHIN) 2 g in dextrose 5 % 50 mL IVPB  Status:  Discontinued     2 g 100 mL/hr over 30 Minutes Intravenous Daily 06/01/14 0723 06/04/14 1234   05/31/14 2200  levofloxacin (LEVAQUIN) IVPB 500 mg  Status:  Discontinued  Comments:  Levaquin 500 mg IV q48h for CrCl < 50 mL/min   500 mg 100 mL/hr over 60 Minutes Intravenous Every 48 hours 05/29/14 2251 05/30/14 1330   05/30/14 1800  cefTRIAXone (ROCEPHIN) 1 g in dextrose 5 % 50 mL IVPB  Status:  Discontinued     1 g 100 mL/hr over 30  Minutes Intravenous Every 24 hours 05/29/14 2232 05/29/14 2249   05/30/14 1400  imipenem-cilastatin (PRIMAXIN) 250 mg in sodium chloride 0.9 % 100 mL IVPB  Status:  Discontinued     250 mg 200 mL/hr over 30 Minutes Intravenous Every 6 hours 05/30/14 1354 06/01/14 0723   05/29/14 2359  levofloxacin (LEVAQUIN) IVPB 500 mg     500 mg 100 mL/hr over 60 Minutes Intravenous  Once 05/29/14 2251 05/30/14 0004   05/29/14 2230  cefTRIAXone (ROCEPHIN) 1 g in dextrose 5 % 50 mL IVPB  Status:  Discontinued     1 g 100 mL/hr over 30 Minutes Intravenous Every 24 hours 05/29/14 2228 05/29/14 2231   05/29/14 1730  cefTRIAXone (ROCEPHIN) 1 g in dextrose 5 % 50 mL IVPB     1 g 100 mL/hr over 30 Minutes Intravenous  Once 05/29/14 1729 05/29/14 1819       PHYSICAL EXAM: Vital signs in last 24 hours: Filed Vitals:   06/04/14 1413 06/04/14 2102 06/05/14 0500 06/05/14 0539  BP: 96/45 127/51  108/50  Pulse: 67 68  66  Temp: 98.8 F (37.1 C) 98.3 F (36.8 C)    TempSrc: Oral Oral    Resp: 16 16  18   Height:      Weight:   76.613 kg (168 lb 14.4 oz)   SpO2: 95% 95%  94%    Weight change: -2.948 kg (-6 lb 8 oz) Filed Weights   06/03/14 0711 06/04/14 0520 06/05/14 0500  Weight: 79.561 kg (175 lb 6.4 oz) 76.839 kg (169 lb 6.4 oz) 76.613 kg (168 lb 14.4 oz)   Body mass index is 28.11 kg/(m^2).   Gen Exam: Awake and alert with clear speech.   Neck: Supple, No JVD.   Chest: B/L Clear-no rales this am at b/l bases CVS: S1 S2 Regular, no murmurs.  Abdomen: soft, BS +, non tender, non distended.  Extremities: no edema, lower extremities warm to touch. Neurologic: Non Focal.   Skin: No Rash.   Wounds: N/A.   Intake/Output from previous day:  Intake/Output Summary (Last 24 hours) at 06/05/14 1023 Last data filed at 06/05/14 0538  Gross per 24 hour  Intake    740 ml  Output   1250 ml  Net   -510 ml     LAB RESULTS: CBC  Recent Labs Lab 05/29/14 1705 05/30/14 0752 05/31/14 0500  06/01/14 0720 06/03/14 0650 06/05/14 0735  WBC 10.4 10.4 6.1 4.7 7.0 5.5  HGB 12.3* 10.7* 9.9* 10.4* 10.4* 9.5*  HCT 36.8* 31.7* 30.0* 31.5* 30.7* 28.2*  PLT 89* 66* 61* 67* 87* 116*  MCV 106.7* 107.8* 107.1* 109.8* 105.1* 103.3*  MCH 35.7* 36.4* 35.4* 36.2* 35.6* 34.8*  MCHC 33.4 33.8 33.0 33.0 33.9 33.7  RDW 14.4 14.8 14.9 14.8 14.5 14.2  LYMPHSABS 0.4*  --   --   --   --   --   MONOABS 0.2  --   --   --   --   --   EOSABS 0.0  --   --   --   --   --   BASOSABS 0.0  --   --   --   --   --  Chemistries   Recent Labs Lab 05/30/14 0752 05/31/14 0500 06/03/14 0650 06/04/14 0548 06/05/14 0735  NA 140 142 142 139 138  K 4.3 4.2 3.8 4.1 4.4  CL 109 113* 105 102 101  CO2 22 18* 24 26 30   GLUCOSE 90 107* 107* 97 103*  BUN 30* 31* 26* 26* 26*  CREATININE 1.73* 1.60* 1.43* 1.34 1.29  CALCIUM 8.1* 8.0* 8.6 8.4 8.3*    CBG: No results found for this basename: GLUCAP,  in the last 168 hours  GFR Estimated Creatinine Clearance: 43.6 ml/min (by C-G formula based on Cr of 1.29).  Coagulation profile  Recent Labs Lab 06/01/14 0720 06/02/14 0950 06/03/14 0650 06/04/14 0548 06/05/14 0735  INR 1.88* 2.56* 3.06* 3.46* 2.80*    Cardiac Enzymes No results found for this basename: CK, CKMB, TROPONINI, MYOGLOBIN,  in the last 168 hours  No components found with this basename: POCBNP,  No results found for this basename: DDIMER,  in the last 72 hours No results found for this basename: HGBA1C,  in the last 72 hours No results found for this basename: CHOL, HDL, LDLCALC, TRIG, CHOLHDL, LDLDIRECT,  in the last 72 hours No results found for this basename: TSH, T4TOTAL, FREET3, T3FREE, THYROIDAB,  in the last 72 hours No results found for this basename: VITAMINB12, FOLATE, FERRITIN, TIBC, IRON, RETICCTPCT,  in the last 72 hours No results found for this basename: LIPASE, AMYLASE,  in the last 72 hours  Urine Studies No results found for this basename: UACOL, UAPR, USPG, UPH,  UTP, UGL, UKET, UBIL, UHGB, UNIT, UROB, ULEU, UEPI, UWBC, URBC, UBAC, CAST, CRYS, UCOM, BILUA,  in the last 72 hours  MICROBIOLOGY: Recent Results (from the past 240 hour(s))  CULTURE, BLOOD (ROUTINE X 2)     Status: None   Collection Time    05/29/14  5:20 PM      Result Value Ref Range Status   Specimen Description BLOOD RIGHT ANTECUBITAL   Final   Special Requests BOTTLES DRAWN AEROBIC ONLY 10CC   Final   Culture  Setup Time     Final   Value: 05/29/2014 22:30     Performed at [place]     Final   Value: NO GROWTH 5 DAYS     Performed at [facility]   Report Status 06/04/2014 FINAL   Final  CULTURE, BLOOD (ROUTINE X 2)     Status: None   Collection Time    05/29/14  5:21 PM      Result Value Ref Range Status   Specimen Description BLOOD LEFT ANTECUBITAL   Final   Special Requests BOTTLES DRAWN AEROBIC ONLY 5CC   Final   Culture  Setup Time     Final   Value: 05/29/2014 22:31     Performed at [place]     Final   Value: NO GROWTH 5 DAYS     Performed at [facility]   Report Status 06/04/2014 FINAL   Final  URINE CULTURE     Status: None   Collection Time    05/29/14  5:53 PM      Result Value Ref Range Status   Specimen Description URINE, CATHETERIZED   Final   Special Requests NONE   Final   Culture  Setup Time     Final   Value: 05/30/2014 01:49     Performed at [place]  Final   Value: >=100,000 COLONIES/ML     Performed at [facility]   Culture     Final   Value: KLEBSIELLA OXYTOCA     Performed at [facility]   Report Status 05/31/2014 FINAL   Final   Organism ID, Bacteria KLEBSIELLA OXYTOCA   Final  CULTURE, BLOOD (ROUTINE X 2)     Status: None   Collection Time    06/03/14  9:25 AM      Result Value Ref Range Status   Specimen Description BLOOD LEFT ARM   Final   Special Requests BOTTLES DRAWN AEROBIC AND ANAEROBIC 10CC   Final   Culture  Setup  Time     Final   Value: 06/03/2014 20:17     Performed at [facility]   Culture     Final   Value:        BLOOD CULTURE RECEIVED NO GROWTH TO DATE CULTURE WILL BE HELD FOR 5 DAYS BEFORE ISSUING A FINAL NEGATIVE REPORT     Performed at [facility]   Report Status PENDING   Incomplete  CULTURE, BLOOD (ROUTINE X 2)     Status: None   Collection Time    06/03/14  9:35 AM      Result Value Ref Range Status   Specimen Description BLOOD RIGHT ARM   Final   Special Requests BOTTLES DRAWN AEROBIC AND ANAEROBIC 10CC   Final   Culture  Setup Time     Final   Value: 06/03/2014 20:17     Performed at [facility]   Culture     Final   Value:        BLOOD CULTURE RECEIVED NO GROWTH TO DATE CULTURE WILL BE HELD FOR 5 DAYS BEFORE ISSUING A FINAL NEGATIVE REPORT     Performed at [facility]   Report Status PENDING   Incomplete    RADIOLOGY STUDIES/RESULTS: Dg Chest 2 View  06/02/2014   CLINICAL DATA:  Cough and shortness of breath.  EXAM: CHEST  2 VIEW  COMPARISON:  05/29/2014 and 03/21/2013.  FINDINGS: Trachea is midline. Heart size stable. There is mixed interstitial and airspace disease, basilar dependent, new from 05/29/2014. Small bilateral effusions. Flowing anterior osteophytosis in the thoracic spine.  IMPRESSION: New mixed interstitial and airspace disease, basilar dependent, with bilateral effusions. Findings may be due to congestive heart failure. Pneumonia not excluded.   Electronically Signed   By: Lorin Picket M.D.   On: 06/02/2014 13:07   Dg Chest 2 View  05/29/2014   CLINICAL DATA:  Fever, UTI  EXAM: CHEST  2 VIEW  COMPARISON:  03/21/2013  FINDINGS: Lungs are essentially clear. No focal consolidation. No pleural effusion or pneumothorax.  The heart is normal in size.  Calcified right perihilar nodes.  Degenerative changes of the visualized thoracolumbar spine.  IMPRESSION: No evidence of acute cardiopulmonary disease.   Electronically Signed   By:  Julian Hy M.D.   On: 05/29/2014 19:18   US Renal  06/01/2014   CLINICAL DATA:  Pyelonephritis. Persistent fever. Question of abscess. Prior cystoscopy. Pre diabetes.  EXAM: RENAL/URINARY TRACT ULTRASOUND COMPLETE  COMPARISON:  Ultrasound of the abdomen on 08/23/2010  FINDINGS: Right Kidney:  Length: 11.1 cm. There is renal parenchymal thinning. No hydronephrosis. Renal echogenicity is normal. No focal mass.  Left Kidney:  Length: 11.4 cm in length. Renal parenchymal thinning. No hydronephrosis. Cyst is 3.4 x 2.9 x 3.0 cm and upper pole region.  Bladder:  Appears normal for degree of bladder distention.  IMPRESSION: 1. No hydronephrosis. 2. Left renal cyst. 3. No abscess identified.   Electronically Signed   By: Shon Hale M.D.   On: 06/01/2014 10:52    Oren Binet, MD  [facility] Pager:[phone]  If 7PM-7AM, please contact night-coverage [email or web address] Password [email or web address] 06/05/2014, 10:23 AM   LOS: 7 days   **Disclaimer: This note may have been dictated with voice recognition software. Similar sounding words can inadvertently be transcribed and this note may contain transcription errors which may not have been corrected upon publication of note.**

## 2014-06-05 NOTE — Progress Notes (Signed)
Physical Therapy Treatment Patient Details Name: [name] MRN: [id number] DOB: 02/10/1931 Today's Date: 06/05/2014    History of Present Illness Pt admitted with weakness and fever along with urinary incontinence urgency and frequency.  Pt was difficult to manage by wife in this state and pt fell getting to the bathroom.  evaluation and IV antibiotics in progress.    PT Comments    Pts wife and daughter present for treatment.  They are concerned about pt going home.  Pt walked today with 3 standing rest breaks.  Safety education needed for RW use.  I dont feel pt steady enough to walk with his cane today.  Pt has rollator at home (that daughter bought out of pocket) but it doesn't fit inside the house.  I recommend pt home with HHPT and RW with 5 inch wheels.  Pt hoping to progress to OPPT within a few weeks. I encouraged wife and daughter to walk pt while in hospital to help him increase his endurance.   Discussed safety with discharge.  Wife will provide 24/7.   Follow Up Recommendations  Home health PT     Equipment Recommendations  Rolling walker with 5" wheels    Recommendations for Other Services       Precautions / Restrictions Precautions Precautions: Fall Precaution Comments: pt has tremor Restrictions Weight Bearing Restrictions: No    Mobility  Bed Mobility Overal bed mobility: Modified Independent Bed Mobility: Supine to Sit     Supine to sit: HOB elevated        Transfers Overall transfer level: Needs assistance Equipment used: Rolling walker (2 wheeled) Transfers: Sit to/from Stand Sit to Stand: Min guard         General transfer comment: pt needed cues for hand placement and for turning to sit safely - pt started crouching when turning and this decreased his safety  Ambulation/Gait Ambulation/Gait assistance: Min guard Ambulation Distance (Feet): 160 Feet Assistive device: Rolling walker (2 wheeled) Gait Pattern/deviations:  Shuffle;Decreased stride length     General Gait Details: pt continues wtih increased tremor today.  pt got SOB with ambulation and took 3 standing rest breaks.  Pt denies dizziness.   Stairs            Wheelchair Mobility    Modified Rankin (Stroke Patients Only)       Balance                                    Cognition Arousal/Alertness: Awake/alert Behavior During Therapy: WFL for tasks assessed/performed Overall Cognitive Status: Within Functional Limits for tasks assessed                      Exercises      General Comments        Pertinent Vitals/Pain Pain Assessment: No/denies pain (no specific complaints of pain today)    Home Living Family/patient expects to be discharged to:: Private residence Living Arrangements: Spouse/significant other Available Help at Discharge: Family;Available 24 hours/day Type of Home: House     Home Layout: One level        Prior Function            PT Goals (current goals can now be found in the care plan section) Progress towards PT goals: Progressing toward goals    Frequency  Min 3X/week    PT Plan Current plan remains appropriate  Co-evaluation             End of Session Equipment Utilized During Treatment: Gait belt Activity Tolerance: Patient tolerated treatment well Patient left: in chair;with family/visitor present;with call bell/phone within reach     Time: 1255-1325 PT Time Calculation (min): 30 min  Charges:  $Gait Training: 23-37 mins                    G Codes:      Loyal Buba 06/05/2014, 1:35 PM 06/05/2014   Rande Lawman, PT

## 2014-06-05 NOTE — Progress Notes (Signed)
ANTICOAGULATION CONSULT NOTE - Follow Up Consult  Pharmacy Consult for warfarin Indication: Hx of PE  Allergies  Allergen Reactions  . Lactose Intolerance (Gi)   . Nizatidine     REACTION: Rash (Axid)  . Penicillins     REACTION: Rash  . Sulfadiazine     REACTION: Hives    Patient Measurements: Height: 5\' 5"  (165.1 cm) Weight: 168 lb 14.4 oz (76.613 kg) IBW/kg (Calculated) : 61.5  Vital Signs: BP: 108/50 mmHg (09/28 0539) Pulse Rate: 66 (09/28 0539)  Labs:  Recent Labs  06/03/14 0650 06/04/14 0548 06/05/14 0735  HGB 10.4*  --  9.5*  HCT 30.7*  --  28.2*  PLT 87*  --  116*  LABPROT 31.6* 34.8* 29.5*  INR 3.06* 3.46* 2.80*  CREATININE 1.43* 1.34 1.29  CKTOTAL 55  --   --     Estimated Creatinine Clearance: 43.6 ml/min (by C-G formula based on Cr of 1.29).  Assessment: 80 yo M on warfarin for history of PE. INR is therapeutic today after holding last night's dose. Antibiotic changed to Levaquin which can potentiate INR so will need to watch closely. No bleeding noted, Hb low stable, platelets improved.  Goal of Therapy:  INR 2-3 Monitor platelets by anticoagulation protocol: Yes   Plan:  - Warfarin 1.5 mg PO tonight - INR daily  [facility], Pharm.D., BCPS Clinical Pharmacist Pager: [phone] 06/05/2014 11:24 AM

## 2014-06-05 NOTE — Progress Notes (Signed)
INFECTIOUS DISEASE PROGRESS NOTE  ID: [name] is a 80 y.o. male with  Principal Problem:   Pyelonephritis Active Problems:   THROMBOCYTOPENIA, PRIMARY NOS   CORONARY ARTERY DISEASE   CKD (chronic kidney disease) stage 3, GFR 30-59 ml/min   HLD (hyperlipidemia)   Depression   Sepsis  Subjective: without complaints  Abtx:  Anti-infectives   Start     Dose/Rate Route Frequency Ordered Stop   06/05/14 0000  levofloxacin (LEVAQUIN) IVPB 750 mg  Status:  Discontinued     750 mg 100 mL/hr over 90 Minutes Intravenous Every 48 hours 06/04/14 2348 06/04/14 2350   06/05/14 0000  levofloxacin (LEVAQUIN) tablet 750 mg     750 mg Oral Every 48 hours 06/04/14 2350     06/01/14 0830  cefTRIAXone (ROCEPHIN) 2 g in dextrose 5 % 50 mL IVPB  Status:  Discontinued     2 g 100 mL/hr over 30 Minutes Intravenous Daily 06/01/14 0723 06/04/14 1234   05/31/14 2200  levofloxacin (LEVAQUIN) IVPB 500 mg  Status:  Discontinued    Comments:  Levaquin 500 mg IV q48h for CrCl < 50 mL/min   500 mg 100 mL/hr over 60 Minutes Intravenous Every 48 hours 05/29/14 2251 05/30/14 1330   05/30/14 1800  cefTRIAXone (ROCEPHIN) 1 g in dextrose 5 % 50 mL IVPB  Status:  Discontinued     1 g 100 mL/hr over 30 Minutes Intravenous Every 24 hours 05/29/14 2232 05/29/14 2249   05/30/14 1400  imipenem-cilastatin (PRIMAXIN) 250 mg in sodium chloride 0.9 % 100 mL IVPB  Status:  Discontinued     250 mg 200 mL/hr over 30 Minutes Intravenous Every 6 hours 05/30/14 1354 06/01/14 0723   05/29/14 2359  levofloxacin (LEVAQUIN) IVPB 500 mg     500 mg 100 mL/hr over 60 Minutes Intravenous  Once 05/29/14 2251 05/30/14 0004   05/29/14 2230  cefTRIAXone (ROCEPHIN) 1 g in dextrose 5 % 50 mL IVPB  Status:  Discontinued     1 g 100 mL/hr over 30 Minutes Intravenous Every 24 hours 05/29/14 2228 05/29/14 2231   05/29/14 1730  cefTRIAXone (ROCEPHIN) 1 g in dextrose 5 % 50 mL IVPB     1 g 100 mL/hr over 30 Minutes Intravenous  Once  05/29/14 1729 05/29/14 1819      Medications:  Scheduled: . atorvastatin  40 mg Oral QHS  . cholecalciferol  1,000 Units Oral Daily  . feeding supplement (RESOURCE BREEZE)  1 Container Oral TID BM  . fenofibrate  160 mg Oral Daily  . gabapentin  100 mg Oral TID  . levofloxacin  750 mg Oral Q48H  . loratadine  10 mg Oral Daily  . oxybutynin  5 mg Oral BID  . pantoprazole  40 mg Oral Daily  . venlafaxine  75 mg Oral Daily  . vitamin B-12  1,000 mcg Oral Daily  . warfarin  1.5 mg Oral ONCE-1800  . Warfarin - Pharmacist Dosing Inpatient   Does not apply q1800    Objective: Vital signs in last 24 hours: Temp:  [98.3 F (36.8 C)-99.5 F (37.5 C)] 99.5 F (37.5 C) (09/28 1730) Pulse Rate:  [66-68] 66 (09/28 0539) Resp:  [16-18] 18 (09/28 0539) BP: (108-127)/(50-51) 108/50 mmHg (09/28 0539) SpO2:  [94 %-95 %] 94 % (09/28 0539) Weight:  [76.613 kg (168 lb 14.4 oz)] 76.613 kg (168 lb 14.4 oz) (09/28 0500)   General appearance: alert, cooperative and no distress Resp: clear to auscultation bilaterally  Cardio: regular rate and rhythm GI: normal findings: bowel sounds normal and soft, non-tender  Lab Results  Recent Labs  06/03/14 0650 06/04/14 0548 06/05/14 0735  WBC 7.0  --  5.5  HGB 10.4*  --  9.5*  HCT 30.7*  --  28.2*  NA 142 139 138  K 3.8 4.1 4.4  CL 105 102 101  CO2 24 26 30   BUN 26* 26* 26*  CREATININE 1.43* 1.34 1.29   Liver Panel  Recent Labs  06/03/14 0650  PROT 5.6*  ALBUMIN 2.4*  AST 73*  ALT 42  ALKPHOS 65  BILITOT 0.8  BILIDIR 0.3  IBILI 0.5   Sedimentation Rate No results found for this basename: ESRSEDRATE,  in the last 72 hours C-Reactive Protein No results found for this basename: CRP,  in the last 72 hours  Microbiology: Recent Results (from the past 240 hour(s))  CULTURE, BLOOD (ROUTINE X 2)     Status: None   Collection Time    05/29/14  5:20 PM      Result Value Ref Range Status   Specimen Description BLOOD RIGHT ANTECUBITAL    Final   Special Requests BOTTLES DRAWN AEROBIC ONLY 10CC   Final   Culture  Setup Time     Final   Value: 05/29/2014 22:30     Performed at [facility]   Culture     Final   Value: NO GROWTH 5 DAYS     Performed at [facility]   Report Status 06/04/2014 FINAL   Final  CULTURE, BLOOD (ROUTINE X 2)     Status: None   Collection Time    05/29/14  5:21 PM      Result Value Ref Range Status   Specimen Description BLOOD LEFT ANTECUBITAL   Final   Special Requests BOTTLES DRAWN AEROBIC ONLY 5CC   Final   Culture  Setup Time     Final   Value: 05/29/2014 22:31     Performed at [place]     Final   Value: NO GROWTH 5 DAYS     Performed at [facility]   Report Status 06/04/2014 FINAL   Final  URINE CULTURE     Status: None   Collection Time    05/29/14  5:53 PM      Result Value Ref Range Status   Specimen Description URINE, CATHETERIZED   Final   Special Requests NONE   Final   Culture  Setup Time     Final   Value: 05/30/2014 01:49     Performed at [place]     Final   Value: >=100,000 COLONIES/ML     Performed at [facility]   Culture     Final   Value: KLEBSIELLA OXYTOCA     Performed at [facility]   Report Status 05/31/2014 FINAL   Final   Organism ID, Bacteria KLEBSIELLA OXYTOCA   Final  CULTURE, BLOOD (ROUTINE X 2)     Status: None   Collection Time    06/03/14  9:25 AM      Result Value Ref Range Status   Specimen Description BLOOD LEFT ARM   Final   Special Requests BOTTLES DRAWN AEROBIC AND ANAEROBIC 10CC   Final   Culture  Setup Time     Final   Value: 06/03/2014 20:17     Performed at [facility]  Final   Value:        BLOOD CULTURE RECEIVED NO GROWTH TO DATE CULTURE WILL BE HELD FOR 5 DAYS BEFORE ISSUING A FINAL NEGATIVE REPORT     Performed at [facility]   Report Status PENDING   Incomplete  CULTURE, BLOOD (ROUTINE X 2)     Status:  None   Collection Time    06/03/14  9:35 AM      Result Value Ref Range Status   Specimen Description BLOOD RIGHT ARM   Final   Special Requests BOTTLES DRAWN AEROBIC AND ANAEROBIC 10CC   Final   Culture  Setup Time     Final   Value: 06/03/2014 20:17     Performed at [facility]   Culture     Final   Value:        BLOOD CULTURE RECEIVED NO GROWTH TO DATE CULTURE WILL BE HELD FOR 5 DAYS BEFORE ISSUING A FINAL NEGATIVE REPORT     Performed at [facility]   Report Status PENDING   Incomplete    Studies/Results: No results found.   Assessment/Plan: UTI- kleb oxytoca (non-esbl) Drug fever, suspected  Total days of antibiotics:  9-21 Ceftriaxone 9-27 9-27 levaquin     Seems to have improved BCx from admission (-) Would aim for 3 more days of levaquin (total of 10) ambulate Available if questions       Bobby Rumpf Infectious Diseases (pager) [phone] [email or web address] 06/05/2014, 8:12 PM  LOS: 7 days

## 2014-06-06 ENCOUNTER — Inpatient Hospital Stay (HOSPITAL_COMMUNITY): Payer: Medicare Other

## 2014-06-06 LAB — PROTIME-INR
INR: 2.16 — ABNORMAL HIGH (ref 0.00–1.49)
PROTHROMBIN TIME: 24.1 s — AB (ref 11.6–15.2)

## 2014-06-06 MED ORDER — WARFARIN SODIUM 3 MG PO TABS
3.0000 mg | ORAL_TABLET | Freq: Once | ORAL | Status: AC
  Administered 2014-06-06: 3 mg via ORAL
  Filled 2014-06-06 (×2): qty 1

## 2014-06-06 MED ORDER — ACETAMINOPHEN 325 MG PO TABS
650.0000 mg | ORAL_TABLET | Freq: Once | ORAL | Status: AC
  Administered 2014-06-06: 650 mg via ORAL
  Filled 2014-06-06: qty 2

## 2014-06-06 MED ORDER — GADOBENATE DIMEGLUMINE 529 MG/ML IV SOLN
15.0000 mL | Freq: Once | INTRAVENOUS | Status: AC | PRN
  Administered 2014-06-06: 15 mL via INTRAVENOUS

## 2014-06-06 NOTE — Progress Notes (Signed)
ANTICOAGULATION CONSULT NOTE - Follow Up Consult  Pharmacy Consult for warfarin Indication: Hx of PE  Allergies  Allergen Reactions  . Lactose Intolerance (Gi)   . Nizatidine     REACTION: Rash (Axid)  . Penicillins     REACTION: Rash  . Sulfadiazine     REACTION: Hives    Patient Measurements: Height: 5\' 5"  (165.1 cm) Weight: 168 lb 8 oz (76.431 kg) IBW/kg (Calculated) : 61.5  Vital Signs: Temp: 100.2 F (37.9 C) (09/29 0505) Temp src: Oral (09/29 0505) BP: 121/49 mmHg (09/29 0505) Pulse Rate: 76 (09/29 0505)  Labs:  Recent Labs  06/04/14 0548 06/05/14 0735 06/06/14 0629  HGB  --  9.5*  --   HCT  --  28.2*  --   PLT  --  116*  --   LABPROT 34.8* 29.5* 24.1*  INR 3.46* 2.80* 2.16*  CREATININE 1.34 1.29  --     Estimated Creatinine Clearance: 43.6 ml/min (by C-G formula based on Cr of 1.29).  Assessment: 80 yo M on warfarin for history of PE. INR is therapeutic today but has trended down after holding a dose. Antibiotic changed to Levaquin which can potentiate INR so will need to watch closely. No bleeding noted, Hb low stable, platelets improved.  Goal of Therapy:  INR 2-3 Monitor platelets by anticoagulation protocol: Yes   Plan:  - Warfarin 3 mg PO tonight - INR daily  [facility], Pharm.D., BCPS Clinical Pharmacist Pager: [phone] 06/06/2014 9:13 AM

## 2014-06-06 NOTE — Progress Notes (Signed)
PATIENT DETAILS Name: [name] Age: 80 y.o. Sex: male Date of Birth: May 03, 1931 Admit Date: 05/29/2014 Admitting Physician Ivor Costa, MD [id number] Danise Mina, MD  Brief summary  Patient is an 80 year old male admitted on 9/21 for fever, and presumed pyelonephritis. Started on broad-spectrum antibiotics, hospital course marked by persistent fever in spite of being on appropriate antibiotics. Currently continues to spike fevers intermittently, infectious disease following.  Subjective: Continues to complain of lower back pain. Developed a fever again this morning  Assessment/Plan: Sepsis  -secondary to pyelonephritis. Sepsis pathophysiology seems to have resolved  -Urine positive for Klebsiella, now on Levaquin-overall better, no fever this am.See below-for further discussion  Pyelonephritis with persistent Fever  - On admission-presented with fever dysuria. Urinalysis consistent with UTI, started on IV antibiotics. Urine culture positive for Klebsiella, that is pansensitive except for Ancef and ampicillin. Currently on day 9 of antimicrobial therapy-but Day 3 of Levaquin. Was on Rocephin, now discontinued given concern for drug fever, hd no fever since yesterday-but unfortunately spike this am. CT Chest shows b/l pleural effusion/atelectasis-likely from CHF-but given persistent fever-?thoracocentesis-suspect will be a low yield.Do not see any other foci of infection evident at this time-however now complaining of back pain-suspect that this is musculoskeletal-this likely is a chronic issue, patient apparently see's a Chiropracter as outpatient for back pain. Since continues to have back pain, and unexplained fever, will get a MRI of his lower back. Renal ultrasound/CT Abd negative for abscess/pyonephroses.  Repeat Blood culture on 9/26-neg. please see below for further details  Persistent fever - Patient continues to spike fever despite being on appropriate antibiotics (Day 9)  for pyelonephritis. Repeat blood culture on 9/26 negative. CT chest/CT abdomen without contrast negative for any abscess or any other foci of infection. No lymphadenopathy seen. Has been on broad-spectrum antibiotics including Primaxin, Rocephin during this admission, now on just Levaquin-day 3. Has been having back pain it is most likely musculoskeletal for the past few days, but with persistent fever will get a MRI of the lumbosacral spine to make sure no infective etiology there. Discussed with Dr. Johnnye Sima this morning, will stop antibiotics and repeat culture of antibiotics. ID will continue to follow as well  Acute diastolic heart failure - Improving, still with mild volume overload-treated with IV Lasix-euvolemic-suspect can be given prn Lasix at this time. Echocardiogram confirms diastolic heart failure with preserved EF.  Thrombocytopenia  -likely secondary to acute illness  -platelet seems to be slowly improving-check CBC periodically  CAD and Hyperlipidemia  -Stable, with no chest pain  -on atorvastatin   Depression  -Stable no suicidal or homicidal ideation.  -Continue home medications.   Essential tremors  -Continue gabapentin   History of pulmonary embolism  -coumadin, continue per pharmacy. Per wife there is a strong family hx of death from blood clots. The patient coded with his PE in 1990s   CKD-III  -Creatinine improved -Monitor renal function periodically  Disposition: Remain inpatient  DVT Prophylaxis: Coumadin  Code Status: Full code   Family Communication Spouse at bedside  Procedures:  None  CONSULTS:  ID  MEDICATIONS: Scheduled Meds: . atorvastatin  40 mg Oral QHS  . cholecalciferol  1,000 Units Oral Daily  . feeding supplement (RESOURCE BREEZE)  1 Container Oral TID BM  . fenofibrate  160 mg Oral Daily  . gabapentin  100 mg Oral TID  . levofloxacin  750 mg Oral Q48H  . loratadine  10 mg Oral Daily  .  oxybutynin  5 mg Oral BID  .  pantoprazole  40 mg Oral Daily  . venlafaxine  75 mg Oral Daily  . vitamin B-12  1,000 mcg Oral Daily  . warfarin  3 mg Oral ONCE-1800  . Warfarin - Pharmacist Dosing Inpatient   Does not apply q1800   Continuous Infusions:  PRN Meds:.acetaminophen, acetaminophen, albuterol, hydrocortisone cream, traMADol  Antibiotics: Anti-infectives   Start     Dose/Rate Route Frequency Ordered Stop   06/05/14 0000  levofloxacin (LEVAQUIN) IVPB 750 mg  Status:  Discontinued     750 mg 100 mL/hr over 90 Minutes Intravenous Every 48 hours 06/04/14 2348 06/04/14 2350   06/05/14 0000  levofloxacin (LEVAQUIN) tablet 750 mg     750 mg Oral Every 48 hours 06/04/14 2350     06/01/14 0830  cefTRIAXone (ROCEPHIN) 2 g in dextrose 5 % 50 mL IVPB  Status:  Discontinued     2 g 100 mL/hr over 30 Minutes Intravenous Daily 06/01/14 0723 06/04/14 1234   05/31/14 2200  levofloxacin (LEVAQUIN) IVPB 500 mg  Status:  Discontinued    Comments:  Levaquin 500 mg IV q48h for CrCl < 50 mL/min   500 mg 100 mL/hr over 60 Minutes Intravenous Every 48 hours 05/29/14 2251 05/30/14 1330   05/30/14 1800  cefTRIAXone (ROCEPHIN) 1 g in dextrose 5 % 50 mL IVPB  Status:  Discontinued     1 g 100 mL/hr over 30 Minutes Intravenous Every 24 hours 05/29/14 2232 05/29/14 2249   05/30/14 1400  imipenem-cilastatin (PRIMAXIN) 250 mg in sodium chloride 0.9 % 100 mL IVPB  Status:  Discontinued     250 mg 200 mL/hr over 30 Minutes Intravenous Every 6 hours 05/30/14 1354 06/01/14 0723   05/29/14 2359  levofloxacin (LEVAQUIN) IVPB 500 mg     500 mg 100 mL/hr over 60 Minutes Intravenous  Once 05/29/14 2251 05/30/14 0004   05/29/14 2230  cefTRIAXone (ROCEPHIN) 1 g in dextrose 5 % 50 mL IVPB  Status:  Discontinued     1 g 100 mL/hr over 30 Minutes Intravenous Every 24 hours 05/29/14 2228 05/29/14 2231   05/29/14 1730  cefTRIAXone (ROCEPHIN) 1 g in dextrose 5 % 50 mL IVPB     1 g 100 mL/hr over 30 Minutes Intravenous  Once 05/29/14 1729 05/29/14  1819       PHYSICAL EXAM: Vital signs in last 24 hours: Filed Vitals:   06/05/14 2331 06/06/14 0239 06/06/14 0330 06/06/14 0505  BP: 116/44   121/49  Pulse: 73   76  Temp: 98.9 F (37.2 C) 99.3 F (37.4 C) 101.4 F (38.6 C) 100.2 F (37.9 C)  TempSrc: Oral  Oral Oral  Resp: 16   16  Height:      Weight:    76.431 kg (168 lb 8 oz)  SpO2: 93%   96%    Weight change: -0.181 kg (-6.4 oz) Filed Weights   06/04/14 0520 06/05/14 0500 06/06/14 0505  Weight: 76.839 kg (169 lb 6.4 oz) 76.613 kg (168 lb 14.4 oz) 76.431 kg (168 lb 8 oz)   Body mass index is 28.04 kg/(m^2).   Gen Exam: Awake and alert with clear speech.   Neck: Supple, No JVD.   Chest: B/L Clear-no rales this am at b/l bases CVS: S1 S2 Regular, no murmurs.  Abdomen: soft, BS +, non tender, non distended.  Extremities: no edema, lower extremities warm to touch. Neurologic: Non Focal.   Skin: No Rash.  Wounds: N/A.   Intake/Output from previous day:  Intake/Output Summary (Last 24 hours) at 06/06/14 1058 Last data filed at 06/05/14 1943  Gross per 24 hour  Intake   1060 ml  Output    800 ml  Net    260 ml     LAB RESULTS: CBC  Recent Labs Lab 05/31/14 0500 06/01/14 0720 06/03/14 0650 06/05/14 0735  WBC 6.1 4.7 7.0 5.5  HGB 9.9* 10.4* 10.4* 9.5*  HCT 30.0* 31.5* 30.7* 28.2*  PLT 61* 67* 87* 116*  MCV 107.1* 109.8* 105.1* 103.3*  MCH 35.4* 36.2* 35.6* 34.8*  MCHC 33.0 33.0 33.9 33.7  RDW 14.9 14.8 14.5 14.2    Chemistries   Recent Labs Lab 05/31/14 0500 06/03/14 0650 06/04/14 0548 06/05/14 0735  NA 142 142 139 138  K 4.2 3.8 4.1 4.4  CL 113* 105 102 101  CO2 18* 24 26 30   GLUCOSE 107* 107* 97 103*  BUN 31* 26* 26* 26*  CREATININE 1.60* 1.43* 1.34 1.29  CALCIUM 8.0* 8.6 8.4 8.3*    CBG: No results found for this basename: GLUCAP,  in the last 168 hours  GFR Estimated Creatinine Clearance: 43.6 ml/min (by C-G formula based on Cr of 1.29).  Coagulation profile  Recent  Labs Lab 06/02/14 0950 06/03/14 0650 06/04/14 0548 06/05/14 0735 06/06/14 0629  INR 2.56* 3.06* 3.46* 2.80* 2.16*    Cardiac Enzymes No results found for this basename: CK, CKMB, TROPONINI, MYOGLOBIN,  in the last 168 hours  No components found with this basename: POCBNP,  No results found for this basename: DDIMER,  in the last 72 hours No results found for this basename: HGBA1C,  in the last 72 hours No results found for this basename: CHOL, HDL, LDLCALC, TRIG, CHOLHDL, LDLDIRECT,  in the last 72 hours No results found for this basename: TSH, T4TOTAL, FREET3, T3FREE, THYROIDAB,  in the last 72 hours No results found for this basename: VITAMINB12, FOLATE, FERRITIN, TIBC, IRON, RETICCTPCT,  in the last 72 hours No results found for this basename: LIPASE, AMYLASE,  in the last 72 hours  Urine Studies No results found for this basename: UACOL, UAPR, USPG, UPH, UTP, UGL, UKET, UBIL, UHGB, UNIT, UROB, ULEU, UEPI, UWBC, URBC, UBAC, CAST, CRYS, UCOM, BILUA,  in the last 72 hours  MICROBIOLOGY: Recent Results (from the past 240 hour(s))  CULTURE, BLOOD (ROUTINE X 2)     Status: None   Collection Time    05/29/14  5:20 PM      Result Value Ref Range Status   Specimen Description BLOOD RIGHT ANTECUBITAL   Final   Special Requests BOTTLES DRAWN AEROBIC ONLY 10CC   Final   Culture  Setup Time     Final   Value: 05/29/2014 22:30     Performed at [facility]     Final   Value: NO GROWTH 5 DAYS     Performed at [facility]   Report Status 06/04/2014 FINAL   Final  CULTURE, BLOOD (ROUTINE X 2)     Status: None   Collection Time    05/29/14  5:21 PM      Result Value Ref Range Status   Specimen Description BLOOD LEFT ANTECUBITAL   Final   Special Requests BOTTLES DRAWN AEROBIC ONLY 5CC   Final   Culture  Setup Time     Final   Value: 05/29/2014 22:31     Performed at [facility]  Final   Value: NO GROWTH 5 DAYS     Performed at  [facility]   Report Status 06/04/2014 FINAL   Final  URINE CULTURE     Status: None   Collection Time    05/29/14  5:53 PM      Result Value Ref Range Status   Specimen Description URINE, CATHETERIZED   Final   Special Requests NONE   Final   Culture  Setup Time     Final   Value: 05/30/2014 01:49     Performed at [facility]     Final   Value: >=100,000 COLONIES/ML     Performed at [facility]     Final   Value: KLEBSIELLA OXYTOCA     Performed at [facility]   Report Status 05/31/2014 FINAL   Final   Organism ID, Bacteria KLEBSIELLA OXYTOCA   Final  CULTURE, BLOOD (ROUTINE X 2)     Status: None   Collection Time    06/03/14  9:25 AM      Result Value Ref Range Status   Specimen Description BLOOD LEFT ARM   Final   Special Requests BOTTLES DRAWN AEROBIC AND ANAEROBIC 10CC   Final   Culture  Setup Time     Final   Value: 06/03/2014 20:17     Performed at [facility]   Culture     Final   Value:        BLOOD CULTURE RECEIVED NO GROWTH TO DATE CULTURE WILL BE HELD FOR 5 DAYS BEFORE ISSUING A FINAL NEGATIVE REPORT     Performed at [facility]   Report Status PENDING   Incomplete  CULTURE, BLOOD (ROUTINE X 2)     Status: None   Collection Time    06/03/14  9:35 AM      Result Value Ref Range Status   Specimen Description BLOOD RIGHT ARM   Final   Special Requests BOTTLES DRAWN AEROBIC AND ANAEROBIC 10CC   Final   Culture  Setup Time     Final   Value: 06/03/2014 20:17     Performed at [facility]   Culture     Final   Value:        BLOOD CULTURE RECEIVED NO GROWTH TO DATE CULTURE WILL BE HELD FOR 5 DAYS BEFORE ISSUING A FINAL NEGATIVE REPORT     Performed at [facility]   Report Status PENDING   Incomplete    RADIOLOGY STUDIES/RESULTS: Dg Chest 2 View  06/02/2014   CLINICAL DATA:  Cough and shortness of breath.  EXAM: CHEST  2 VIEW  COMPARISON:  05/29/2014 and 03/21/2013.   FINDINGS: Trachea is midline. Heart size stable. There is mixed interstitial and airspace disease, basilar dependent, new from 05/29/2014. Small bilateral effusions. Flowing anterior osteophytosis in the thoracic spine.  IMPRESSION: New mixed interstitial and airspace disease, basilar dependent, with bilateral effusions. Findings may be due to congestive heart failure. Pneumonia not excluded.   Electronically Signed   By: Lorin Picket M.D.   On: 06/02/2014 13:07   Dg Chest 2 View  05/29/2014   CLINICAL DATA:  Fever, UTI  EXAM: CHEST  2 VIEW  COMPARISON:  03/21/2013  FINDINGS: Lungs are essentially clear. No focal consolidation. No pleural effusion or pneumothorax.  The heart is normal in size.  Calcified right perihilar nodes.  Degenerative changes of the visualized thoracolumbar spine.  IMPRESSION:  No evidence of acute cardiopulmonary disease.   Electronically Signed   By: Julian Hy M.D.   On: 05/29/2014 19:18   US Renal  06/01/2014   CLINICAL DATA:  Pyelonephritis. Persistent fever. Question of abscess. Prior cystoscopy. Pre diabetes.  EXAM: RENAL/URINARY TRACT ULTRASOUND COMPLETE  COMPARISON:  Ultrasound of the abdomen on 08/23/2010  FINDINGS: Right Kidney:  Length: 11.1 cm. There is renal parenchymal thinning. No hydronephrosis. Renal echogenicity is normal. No focal mass.  Left Kidney:  Length: 11.4 cm in length. Renal parenchymal thinning. No hydronephrosis. Cyst is 3.4 x 2.9 x 3.0 cm and upper pole region.  Bladder:  Appears normal for degree of bladder distention.  IMPRESSION: 1. No hydronephrosis. 2. Left renal cyst. 3. No abscess identified.   Electronically Signed   By: Shon Hale M.D.   On: 06/01/2014 10:52    Oren Binet, MD  [facility] Pager:[phone]  If 7PM-7AM, please contact night-coverage [email or web address] Password [email or web address] 06/06/2014, 10:58 AM   LOS: 8 days   **Disclaimer: This note may have been dictated with voice recognition software. Similar sounding  words can inadvertently be transcribed and this note may contain transcription errors which may not have been corrected upon publication of note.**

## 2014-06-06 NOTE — Progress Notes (Signed)
PT Cancellation Note  Patient Details Name: [name] MRN: [id number] DOB: Jun 25, 1931   Cancelled Treatment:    Reason Eval/Treat Not Completed: Patient at procedure or test/unavailable. Patient down at MRI, Will follow up as able later today   Jacqualyn Posey 06/06/2014, 10:01 AM

## 2014-06-06 NOTE — Progress Notes (Signed)
Physical Therapy Treatment Patient Details Name: [name] MRN: [id number] DOB: 1931/04/02 Today's Date: 06/06/2014    History of Present Illness Pt admitted with weakness and fever along with urinary incontinence urgency and frequency.  Pt was difficult to manage by wife in this state and pt fell getting to the bathroom.  evaluation and IV antibiotics in progress.    PT Comments    Patient progressing with ambulation. Had a bout of incontinence in hallway and had to return to room for pericare. Patient requesting to lay back in bed at end of session  Follow Up Recommendations  Home health PT     Equipment Recommendations  Rolling walker with 5" wheels    Recommendations for Other Services       Precautions / Restrictions Precautions Precautions: Fall Precaution Comments: pt has tremor    Mobility  Bed Mobility Overal bed mobility: Modified Independent                Transfers Overall transfer level: Needs assistance Equipment used: Rolling walker (2 wheeled)   Sit to Stand: Min guard         General transfer comment: Cues for hand placement and safety with sitting  Ambulation/Gait Ambulation/Gait assistance: Min guard Ambulation Distance (Feet): 200 Feet Assistive device: Rolling walker (2 wheeled) Gait Pattern/deviations: Step-through pattern;Decreased stride length;Shuffle Gait velocity: wfl   General Gait Details: Cues for safe use of RW especially in tight spaces. Patient with bout of incontinence with ambulation and returned to room   Stairs            Wheelchair Mobility    Modified Rankin (Stroke Patients Only)       Balance                                    Cognition Arousal/Alertness: Awake/alert Behavior During Therapy: WFL for tasks assessed/performed Overall Cognitive Status: Within Functional Limits for tasks assessed                      Exercises      General Comments         Pertinent Vitals/Pain Pain Assessment: No/denies pain    Home Living                      Prior Function            PT Goals (current goals can now be found in the care plan section) Progress towards PT goals: Progressing toward goals    Frequency  Min 3X/week    PT Plan Current plan remains appropriate    Co-evaluation             End of Session Equipment Utilized During Treatment: Gait belt Activity Tolerance: Patient tolerated treatment well;Other (comment) (incontinent episode in hallway) Patient left: in bed;with call bell/phone within reach     Time: 1421-1445 PT Time Calculation (min): 24 min  Charges:  $Gait Training: 8-22 mins $Therapeutic Activity: 8-22 mins                    G Codes:      Jacqualyn Posey 06/06/2014, 2:55 PM 06/06/2014 Jacqualyn Posey PTA [phone] pager [phone] office

## 2014-06-07 DIAGNOSIS — Z5181 Encounter for therapeutic drug level monitoring: Secondary | ICD-10-CM

## 2014-06-07 DIAGNOSIS — I10 Essential (primary) hypertension: Secondary | ICD-10-CM

## 2014-06-07 LAB — CBC
HEMATOCRIT: 29 % — AB (ref 39.0–52.0)
Hemoglobin: 9.8 g/dL — ABNORMAL LOW (ref 13.0–17.0)
MCH: 36 pg — AB (ref 26.0–34.0)
MCHC: 33.8 g/dL (ref 30.0–36.0)
MCV: 106.6 fL — ABNORMAL HIGH (ref 78.0–100.0)
Platelets: 146 10*3/uL — ABNORMAL LOW (ref 150–400)
RBC: 2.72 MIL/uL — ABNORMAL LOW (ref 4.22–5.81)
RDW: 14.3 % (ref 11.5–15.5)
WBC: 6.4 10*3/uL (ref 4.0–10.5)

## 2014-06-07 LAB — PROTIME-INR
INR: 1.98 — ABNORMAL HIGH (ref 0.00–1.49)
Prothrombin Time: 22.5 seconds — ABNORMAL HIGH (ref 11.6–15.2)

## 2014-06-07 LAB — BASIC METABOLIC PANEL
Anion gap: 11 (ref 5–15)
BUN: 24 mg/dL — AB (ref 6–23)
CALCIUM: 8.6 mg/dL (ref 8.4–10.5)
CO2: 23 mEq/L (ref 19–32)
CREATININE: 1.33 mg/dL (ref 0.50–1.35)
Chloride: 103 mEq/L (ref 96–112)
GFR, EST AFRICAN AMERICAN: 57 mL/min — AB (ref >90)
GFR, EST NON AFRICAN AMERICAN: 49 mL/min — AB (ref >90)
Glucose, Bld: 102 mg/dL — ABNORMAL HIGH (ref 70–99)
POTASSIUM: 4.2 meq/L (ref 3.7–5.3)
Sodium: 137 mEq/L (ref 137–147)

## 2014-06-07 MED ORDER — WARFARIN SODIUM 4 MG PO TABS
4.0000 mg | ORAL_TABLET | Freq: Once | ORAL | Status: AC
  Administered 2014-06-07: 4 mg via ORAL
  Filled 2014-06-07: qty 1

## 2014-06-07 NOTE — Clinical Social Work Psychosocial (Signed)
Clinical Social Work Department BRIEF PSYCHOSOCIAL ASSESSMENT 06/07/2014  Patient:  [name], [name]     Account Number:  [id number]     Admit date:  05/29/2014  Clinical Social Worker:  Lovey Newcomer  Date/Time:  06/07/2014 10:00 AM  Referred by:  Physician  Date Referred:  06/07/2014 Referred for  SNF Placement   Other Referral:   NA   Interview type:  Family Other interview type:   Patient's daughter and wife interviewed at bedside.    PSYCHOSOCIAL DATA Living Status:  WIFE Admitted from facility:   Level of care:   Primary support name:  Belenda Cruise Primary support relationship to patient:  SPOUSE Degree of support available:   Support is strong.    CURRENT CONCERNS Current Concerns  Post-Acute Placement   Other Concerns:   NA    SOCIAL WORK ASSESSMENT / PLAN CSW met with patient's wife and daughter [name] at bedside to complete assessment. Wife Belenda Cruise is a retired NP and daughter [name] is a Therapist, sports currently living in [place]. Both believe that the patient is going to need SNF placement at discharge as he needs more assistance than wife can provide. Belenda Cruise appeared calm and engaged in assessment. [name] appeared to be quite anxious but engaged in assessment. CSW explained SNF search/placement process and answered questions. Wife reports that the patient lives with her at home and that she is his main support. Patient has another daughter in the [place] area, but she was not present for assessment. Both wife and daughter expressed appreciation of CSW visit and state, "meeting with you has been so helpful." CSW will assist.   Assessment/plan status:  Psychosocial Support/Ongoing Assessment of Needs Other assessment/ plan:   Complete Fl2, Fax, PASRR   Information/referral to community resources:   CSW contact information and SNF list given.    PATIENT'S/FAMILY'S RESPONSE TO PLAN OF CARE: Patient's wife and daughter plan for patient to DC to a SNF once  medically stable. CSW will assist.        Liz Beach MSW, [place], [place], [phone]

## 2014-06-07 NOTE — Clinical Social Work Placement (Signed)
Clinical Social Work Department CLINICAL SOCIAL WORK PLACEMENT NOTE 06/07/2014  Patient:  SEBASTYAN, SNODGRASS  Account Number:  [id number] [place] date:  05/29/2014  Clinical Social Worker:  Lovey Newcomer  Date/time:  06/07/2014 06:23 PM  Clinical Social Work is seeking post-discharge placement for this patient at the following level of care:   SKILLED NURSING   (*CSW will update this form in Epic as items are completed)   06/07/2014  Patient/family provided with [facility] Department of Clinical Social Work's list of facilities offering this level of care within the geographic area requested by the patient (or if unable, by the patient's family).  06/07/2014  Patient/family informed of their freedom to choose among providers that offer the needed level of care, that participate in Medicare, Medicaid or managed care program needed by the patient, have an available bed and are willing to accept the patient.  06/07/2014  Patient/family informed of [facility]' ownership interest in [facility], as well as of the fact that they are under no obligation to receive care at this facility.  PASARR submitted to EDS on 06/07/2014 PASARR number received on 06/07/2014  FL2 transmitted to all facilities in geographic area requested by pt/family on  06/07/2014 FL2 transmitted to all facilities within larger geographic area on   Patient informed that his/her managed care company has contracts with or will negotiate with  certain facilities, including the following:     Patient/family informed of bed offers received:  06/07/2014 Patient chooses bed at  Physician recommends and patient chooses bed at    Patient to be transferred to  on   Patient to be transferred to facility by  Patient and family notified of transfer on  Name of family member notified:    The following physician request were entered in Epic:   Additional Comments:   Liz Beach MSW, [place], [place],  [phone]

## 2014-06-07 NOTE — Progress Notes (Signed)
ANTICOAGULATION CONSULT NOTE - Follow Up Consult  Pharmacy Consult for warfarin Indication: Hx of PE  Allergies  Allergen Reactions  . Lactose Intolerance (Gi)   . Nizatidine     REACTION: Rash (Axid)  . Penicillins     REACTION: Rash  . Sulfadiazine     REACTION: Hives    Patient Measurements: Height: 5\' 5"  (165.1 cm) Weight: 166 lb 3.2 oz (75.388 kg) IBW/kg (Calculated) : 61.5  Vital Signs: Temp: 98.2 F (36.8 C) (09/30 0636) Temp src: Oral (09/30 0636) BP: 123/54 mmHg (09/30 0636) Pulse Rate: 69 (09/30 0636)  Labs:  Recent Labs  06/05/14 0735 06/06/14 0629 06/07/14 0718  HGB 9.5*  --  9.8*  HCT 28.2*  --  29.0*  PLT 116*  --  146*  LABPROT 29.5* 24.1* 22.5*  INR 2.80* 2.16* 1.98*  CREATININE 1.29  --  1.33    Estimated Creatinine Clearance: 42 ml/min (by C-G formula based on Cr of 1.33).  Assessment: 80 yo M on warfarin for history of PE. INR is just below goal today. Antibiotics have been discontinued. No bleeding noted, Hb low stable, platelets improved.  Goal of Therapy:  INR 2-3 Monitor platelets by anticoagulation protocol: Yes   Plan:  - Warfarin 4 mg PO tonight - INR daily  [facility], Pharm.D., BCPS Clinical Pharmacist Pager: [phone] 06/07/2014 1:23 PM

## 2014-06-07 NOTE — Progress Notes (Signed)
PROGRESS NOTE  [name] ZWC:[id number] DOB: 1930-12-16 DOA: 05/29/2014 PCP: Ria Bush, MD  HPI/Subjective: Patient is an 80 year old male admitted on 9/21 for fever, and presumed pyelonephritis. Started on broad-spectrum antibiotics, hospital course marked by persistent fever in spite of being on appropriate antibiotics. Infectious disease is following.  9/30- Patient has been afebrile for over 24 hours. Currently not having any back back. Breathing okay. Appetite still decreased but eating. Still has quite a bit of weakness. Mild burning of urination, but improved. Good urine output.      Assessment/Plan:  Sepsis  -Presented with fever of 103.1, documented pyelonephritis. -Sepsis pathophysiology seems to have resolved  -Urine positive for Klebsiella. Finished oral levaquin on 9/30. -No fever in over 24 hours.  -Fever may have been drug induced, monitor him off of antibiotics.  -Can be discharged if no fever, if patient spikes a temp we will re-work him up for sepsis  Pyelonephritis with persistent Fever -On admission had high fever, dysuria and UA was consistent with UTI. Urine culture showed Klebsiella with sensitivity to Rocephin. Was given at first, but discontinued due to concern of drug fever. -Was spiking fevers daily. First afebrile on 9/28, fever returned on 9/29, and afebrile today (9/30). -No CVA tenderness since 9/28. -Renal ultrasound/CT Abd negative for abscess/pyonephroses.  -Blood culture from 9/26- showing no growth. Final results still pending. -ID will continue to follow. -Completed Levaquin regimen at 1100 on 9/29, currently not on antibiotic.  Back Pain -Had increased back pain since his fall at home 10 days ago. Stated 10 out of 10 upon movement on 9/28 and 9/29. -MRI of back performed due to complaints and unresolved fever despite adequate antibiotic treatment. -MRI showed no evidence of abscess, etc. Did show some moderate stenosis and broad  based disc bulge. -Pain was likely musculoskeletal, from fall and mostly bed bound. -Not currently experiencing pain (9/29).  Acute diastolic heart failure  -Echocardiogram confirmed diastolic heart failure with preserved EF -CT Chest shows b/l pleural effusion/atelectatis, likely from CHF. -Some lower lobe rales heard on 9/26 to 9/28. Blood culture started on 26th to determine if infectious etiology, especially since patient had fever. No growth to date, final results pending. WBC's have been WNL. -Was treated with IV Lasix. Symptoms are improving. -Did not hear lower lobe fine rales on 9/29 and 9/30. -No lower extremity edema. Still has significant weakness. -Lasix PRN.  Weakness -Had baseline weakness on admission, which has worsened since onset of symptoms. -Needs assistance getting up and unstable while walking -PT eval on 9/30 determined home health rehab. -Wife and daughter concerned for safety at home. -Meeting with social services on 9/30 for potential inpatient rehab options.  Thrombocytopenia  -likely secondary to acute illness  -platelet slowly improving-check CBC periodically   CAD and Hyperlipidemia  -Stable, with no chest pain  -on atorvastatin   Depression  -Stable no suicidal or homicidal ideation.  -Continue home medications.   Essential tremors  -Continue gabapentin   History of pulmonary embolism  -coumadin, continue per pharmacy. Per wife there is a strong family hx of death from blood clots. The patient coded with his PE in 1990s   CKD-III  -Creatinine improved  -Monitor renal function periodically  DVT Prophylaxis:  Coumadin  Code Status: Full Family Communication: Spouse at bedside Disposition Plan: Undetermined- home health vs. inpatient rehab.  Consultants:  ID  Procedures:  None  Antibiotics: Anti-infectives   Start     Dose/Rate Route Frequency Ordered Stop   06/05/14  0000  levofloxacin (LEVAQUIN) IVPB 750 mg  Status:   Discontinued     750 mg 100 mL/hr over 90 Minutes Intravenous Every 48 hours 06/04/14 2348 06/04/14 2350   06/05/14 0000  levofloxacin (LEVAQUIN) tablet 750 mg  Status:  Discontinued     750 mg Oral Every 48 hours 06/04/14 2350 06/06/14 1104   06/01/14 0830  cefTRIAXone (ROCEPHIN) 2 g in dextrose 5 % 50 mL IVPB  Status:  Discontinued     2 g 100 mL/hr over 30 Minutes Intravenous Daily 06/01/14 0723 06/04/14 1234   05/31/14 2200  levofloxacin (LEVAQUIN) IVPB 500 mg  Status:  Discontinued    Comments:  Levaquin 500 mg IV q48h for CrCl < 50 mL/min   500 mg 100 mL/hr over 60 Minutes Intravenous Every 48 hours 05/29/14 2251 05/30/14 1330   05/30/14 1800  cefTRIAXone (ROCEPHIN) 1 g in dextrose 5 % 50 mL IVPB  Status:  Discontinued     1 g 100 mL/hr over 30 Minutes Intravenous Every 24 hours 05/29/14 2232 05/29/14 2249   05/30/14 1400  imipenem-cilastatin (PRIMAXIN) 250 mg in sodium chloride 0.9 % 100 mL IVPB  Status:  Discontinued     250 mg 200 mL/hr over 30 Minutes Intravenous Every 6 hours 05/30/14 1354 06/01/14 0723   05/29/14 2359  levofloxacin (LEVAQUIN) IVPB 500 mg     500 mg 100 mL/hr over 60 Minutes Intravenous  Once 05/29/14 2251 05/30/14 0004   05/29/14 2230  cefTRIAXone (ROCEPHIN) 1 g in dextrose 5 % 50 mL IVPB  Status:  Discontinued     1 g 100 mL/hr over 30 Minutes Intravenous Every 24 hours 05/29/14 2228 05/29/14 2231   05/29/14 1730  cefTRIAXone (ROCEPHIN) 1 g in dextrose 5 % 50 mL IVPB     1 g 100 mL/hr over 30 Minutes Intravenous  Once 05/29/14 1729 05/29/14 1819      Objective: Filed Vitals:   06/06/14 1154 06/06/14 1359 06/06/14 2142 06/07/14 0636  BP:  115/64 129/58 123/54  Pulse:  84 71 69  Temp: 99.1 F (37.3 C) 99.6 F (37.6 C) 99 F (37.2 C) 98.2 F (36.8 C)  TempSrc: Oral Oral Oral Oral  Resp:  16 17 15   Height:      Weight:    75.388 kg (166 lb 3.2 oz)  SpO2:  95% 95% 95%    Intake/Output Summary (Last 24 hours) at 06/07/14 1014 Last data filed at  06/07/14 0640  Gross per 24 hour  Intake    820 ml  Output   2075 ml  Net  -1255 ml   Filed Weights   06/05/14 0500 06/06/14 0505 06/07/14 0636  Weight: 76.613 kg (168 lb 14.4 oz) 76.431 kg (168 lb 8 oz) 75.388 kg (166 lb 3.2 oz)    Exam: General: Well developed, well nourished, NAD, appears stated age, amiable HEENT:  Anicteic Sclera, MMM. No pharyngeal erythema or exudates  Neck: Supple, no JVD, no masses  Cardiovascular: RRR, S1 S2 auscultated, no rubs, murmurs or gallops.   Respiratory: Clear to auscultation bilaterally with equal chest rise Abdomen: Soft, nondistended, + bowel sounds, mild tenderness to subumbilical region, no masses Extremities: warm dry without cyanosis clubbing or edema.  Neuro: AAOx3, cranial nerves grossly intact. Strength 5/5 in upper and lower extremities  Skin: Without exudates or nodules. Mild erythematous rash near right side of mouth and cheek with flaking  Psych: Normal affect and demeanor with intact judgement and insight   Data Reviewed: Basic  Metabolic Panel:  Recent Labs Lab 06/03/14 0650 06/04/14 0548 06/05/14 0735 06/07/14 0718  NA 142 139 138 137  K 3.8 4.1 4.4 4.2  CL 105 102 101 103  CO2 24 26 30 23   GLUCOSE 107* 97 103* 102*  BUN 26* 26* 26* 24*  CREATININE 1.43* 1.34 1.29 1.33  CALCIUM 8.6 8.4 8.3* 8.6   Liver Function Tests:  Recent Labs Lab 06/03/14 0650  AST 73*  ALT 42  ALKPHOS 65  BILITOT 0.8  PROT 5.6*  ALBUMIN 2.4*   No results found for this basename: LIPASE, AMYLASE,  in the last 168 hours No results found for this basename: AMMONIA,  in the last 168 hours CBC:  Recent Labs Lab 06/01/14 0720 06/03/14 0650 06/05/14 0735 06/07/14 0718  WBC 4.7 7.0 5.5 6.4  HGB 10.4* 10.4* 9.5* 9.8*  HCT 31.5* 30.7* 28.2* 29.0*  MCV 109.8* 105.1* 103.3* 106.6*  PLT 67* 87* 116* 146*   Cardiac Enzymes:  Recent Labs Lab 06/03/14 0650  CKTOTAL 55   BNP (last 3 results)  Recent Labs  06/03/14 0650  PROBNP  6915.0*   CBG: No results found for this basename: GLUCAP,  in the last 168 hours  Recent Results (from the past 240 hour(s))  CULTURE, BLOOD (ROUTINE X 2)     Status: None   Collection Time    05/29/14  5:20 PM      Result Value Ref Range Status   Specimen Description BLOOD RIGHT ANTECUBITAL   Final   Special Requests BOTTLES DRAWN AEROBIC ONLY 10CC   Final   Culture  Setup Time     Final   Value: 05/29/2014 22:30     Performed at [place]     Final   Value: NO GROWTH 5 DAYS     Performed at [facility]   Report Status 06/04/2014 FINAL   Final  CULTURE, BLOOD (ROUTINE X 2)     Status: None   Collection Time    05/29/14  5:21 PM      Result Value Ref Range Status   Specimen Description BLOOD LEFT ANTECUBITAL   Final   Special Requests BOTTLES DRAWN AEROBIC ONLY 5CC   Final   Culture  Setup Time     Final   Value: 05/29/2014 22:31     Performed at [place]     Final   Value: NO GROWTH 5 DAYS     Performed at [facility]   Report Status 06/04/2014 FINAL   Final  URINE CULTURE     Status: None   Collection Time    05/29/14  5:53 PM      Result Value Ref Range Status   Specimen Description URINE, CATHETERIZED   Final   Special Requests NONE   Final   Culture  Setup Time     Final   Value: 05/30/2014 01:49     Performed at [place]     Final   Value: >=100,000 COLONIES/ML     Performed at [place]     Final   Value: KLEBSIELLA OXYTOCA     Performed at [facility]   Report Status 05/31/2014 FINAL   Final   Organism ID, Bacteria KLEBSIELLA OXYTOCA   Final  CULTURE, BLOOD (ROUTINE X 2)     Status: None   Collection Time    06/03/14  9:25  AM      Result Value Ref Range Status   Specimen Description BLOOD LEFT ARM   Final   Special Requests BOTTLES DRAWN AEROBIC AND ANAEROBIC 10CC   Final   Culture  Setup Time     Final   Value: 06/03/2014 20:17      Performed at [facility]   Culture     Final   Value:        BLOOD CULTURE RECEIVED NO GROWTH TO DATE CULTURE WILL BE HELD FOR 5 DAYS BEFORE ISSUING A FINAL NEGATIVE REPORT     Performed at [facility]   Report Status PENDING   Incomplete  CULTURE, BLOOD (ROUTINE X 2)     Status: None   Collection Time    06/03/14  9:35 AM      Result Value Ref Range Status   Specimen Description BLOOD RIGHT ARM   Final   Special Requests BOTTLES DRAWN AEROBIC AND ANAEROBIC 10CC   Final   Culture  Setup Time     Final   Value: 06/03/2014 20:17     Performed at [facility]   Culture     Final   Value:        BLOOD CULTURE RECEIVED NO GROWTH TO DATE CULTURE WILL BE HELD FOR 5 DAYS BEFORE ISSUING A FINAL NEGATIVE REPORT     Performed at [facility]   Report Status PENDING   Incomplete     Studies: Mr Lumbar Spine W Wo Contrast  06/06/2014   CLINICAL DATA:  Pt began complaining of back pain [birth year] y.o. male past medical history of CKD-III, CAD, essential tremor, thrombocytopenia, who presents with urinary incontinence and urgency, and burning on urination. Patient has been doing well until this morning at about 1 AM, when he had urinary incontinence. When patient got up and went to the bath room in the early morning at about 1:00 AM, he had urinary incontinence and wetted himself. He also has urinary urgency and burning on urination. He had fever with a temperature up to 104.0. He felt generally weak, and fell on the ground. Per his wife, patient did not have paraplegia, did not have weakness or numbness specifically on one side of his body or on any extremities (patient's wife was NP before retired. She is very sure that patient did not have signs of stroke or TIA). Patient had a short lasting left hand tingling sensation which resolved when I saw him in ED. Patient does not have chest pain, shortness of breath, abdominal pain, nausea, vomiting or rashes. Per patient's wife,  his blood pressure normally runs low, the systolic pressure is about ~90 mmHg at home normally.  EXAM: MRI LUMBAR SPINE WITHOUT AND WITH CONTRAST  TECHNIQUE: Multiplanar and multiecho pulse sequences of the lumbar spine were obtained without and with intravenous contrast.  CONTRAST:  74mL MULTIHANCE GADOBENATE DIMEGLUMINE 529 MG/ML IV SOLN  COMPARISON:  None.  FINDINGS: The vertebral bodies of the lumbar spine are normal in size. The vertebral bodies of the lumbar spine are normal in alignment. Marrow heterogeneity is present. Although this can be caused by marrow infiltrative processes, the most common causes include chronic renal disease, anemia, smoking, obesity, or advancing age. The intervertebral disc spaces are well-maintained.  The spinal cord is normal in signal and contour. The cord terminates normally at L1 . The nerve roots of the cauda equina and the filum terminale are normal.  The visualized portions of the SI  joints are unremarkable.  There is a stable left renal cyst.  T12-L1: No significant disc bulge. No evidence of neural foraminal stenosis. No central canal stenosis.  L1-L2: No significant disc bulge. No evidence of neural foraminal stenosis. No central canal stenosis.  L2-L3: No significant disc bulge. No evidence of neural foraminal stenosis. No central canal stenosis.  L3-L4: No significant disc bulge. No evidence of neural foraminal stenosis. No central canal stenosis.  L4-L5: Moderate broad-based disc ulge. Severe bilateral facet arthropathy with ligamentum flavum infolding resulting in moderate spinal stenosis and bilateral lateral recess stenosis. No evidence of neural foraminal stenosis.  L5-S1: No significant disc bulge. No evidence of neural foraminal stenosis. No central canal stenosis.  IMPRESSION: 1. At L4-5 there is moderate broad-based disc bulge with severe bilateral facet arthropathy and ligamentum flavum infolding resulting in moderate spinal stenosis and bilateral lateral  recess stenosis. 2. Marrow heterogeneity is present. Although this can be caused by marrow infiltrative processes, the most common causes include chronic renal disease, anemia, smoking, obesity or advancing age.   Electronically Signed   By: Kathreen Devoid   On: 06/06/2014 13:45    Scheduled Meds: . atorvastatin  40 mg Oral QHS  . cholecalciferol  1,000 Units Oral Daily  . feeding supplement (RESOURCE BREEZE)  1 Container Oral TID BM  . fenofibrate  160 mg Oral Daily  . gabapentin  100 mg Oral TID  . loratadine  10 mg Oral Daily  . oxybutynin  5 mg Oral BID  . pantoprazole  40 mg Oral Daily  . venlafaxine  75 mg Oral Daily  . vitamin B-12  1,000 mcg Oral Daily  . Warfarin - Pharmacist Dosing Inpatient   Does not apply q1800   Continuous Infusions:   Principal Problem:   Pyelonephritis Active Problems:   THROMBOCYTOPENIA, PRIMARY NOS   CORONARY ARTERY DISEASE   CKD (chronic kidney disease) stage 3, GFR 30-59 ml/min   HLD (hyperlipidemia)   Depression   Sepsis    Darcella Gasman, PA-S  [facility] Pager [phone]. If 7PM-7AM, please contact night-coverage at [email or web address], password [email or web address] 06/07/2014, 10:14 AM  LOS: 9 days   Addendum  Patient seen and examined, chart and data base reviewed.  I agree with the above assessment and plan.  For full details please see Mr. Darcella Gasman, PA-S note.  I reviewed and amended the above note.   Birdie Hopes, MD [facility] Pager: [phone] 06/07/2014, 4:21 PM

## 2014-06-08 ENCOUNTER — Telehealth: Payer: Self-pay | Admitting: Family Medicine

## 2014-06-08 DIAGNOSIS — M1990 Unspecified osteoarthritis, unspecified site: Secondary | ICD-10-CM | POA: Diagnosis not present

## 2014-06-08 DIAGNOSIS — M810 Age-related osteoporosis without current pathological fracture: Secondary | ICD-10-CM | POA: Diagnosis not present

## 2014-06-08 DIAGNOSIS — I4891 Unspecified atrial fibrillation: Secondary | ICD-10-CM | POA: Diagnosis not present

## 2014-06-08 DIAGNOSIS — R269 Unspecified abnormalities of gait and mobility: Secondary | ICD-10-CM

## 2014-06-08 DIAGNOSIS — N390 Urinary tract infection, site not specified: Secondary | ICD-10-CM | POA: Diagnosis not present

## 2014-06-08 DIAGNOSIS — N12 Tubulo-interstitial nephritis, not specified as acute or chronic: Secondary | ICD-10-CM | POA: Diagnosis not present

## 2014-06-08 DIAGNOSIS — J309 Allergic rhinitis, unspecified: Secondary | ICD-10-CM | POA: Diagnosis not present

## 2014-06-08 DIAGNOSIS — R1310 Dysphagia, unspecified: Secondary | ICD-10-CM

## 2014-06-08 DIAGNOSIS — I2510 Atherosclerotic heart disease of native coronary artery without angina pectoris: Secondary | ICD-10-CM | POA: Diagnosis not present

## 2014-06-08 DIAGNOSIS — A419 Sepsis, unspecified organism: Secondary | ICD-10-CM | POA: Diagnosis not present

## 2014-06-08 DIAGNOSIS — Z5181 Encounter for therapeutic drug level monitoring: Secondary | ICD-10-CM | POA: Diagnosis not present

## 2014-06-08 DIAGNOSIS — G250 Essential tremor: Secondary | ICD-10-CM | POA: Diagnosis not present

## 2014-06-08 DIAGNOSIS — K219 Gastro-esophageal reflux disease without esophagitis: Secondary | ICD-10-CM | POA: Diagnosis not present

## 2014-06-08 DIAGNOSIS — D696 Thrombocytopenia, unspecified: Secondary | ICD-10-CM | POA: Diagnosis not present

## 2014-06-08 DIAGNOSIS — Z7901 Long term (current) use of anticoagulants: Secondary | ICD-10-CM | POA: Diagnosis not present

## 2014-06-08 DIAGNOSIS — Z86711 Personal history of pulmonary embolism: Secondary | ICD-10-CM | POA: Diagnosis not present

## 2014-06-08 DIAGNOSIS — E785 Hyperlipidemia, unspecified: Secondary | ICD-10-CM | POA: Diagnosis not present

## 2014-06-08 DIAGNOSIS — J45909 Unspecified asthma, uncomplicated: Secondary | ICD-10-CM | POA: Diagnosis not present

## 2014-06-08 DIAGNOSIS — R339 Retention of urine, unspecified: Secondary | ICD-10-CM | POA: Diagnosis not present

## 2014-06-08 DIAGNOSIS — N183 Chronic kidney disease, stage 3 (moderate): Secondary | ICD-10-CM | POA: Diagnosis not present

## 2014-06-08 DIAGNOSIS — H9193 Unspecified hearing loss, bilateral: Secondary | ICD-10-CM

## 2014-06-08 DIAGNOSIS — M549 Dorsalgia, unspecified: Secondary | ICD-10-CM | POA: Diagnosis not present

## 2014-06-08 DIAGNOSIS — R502 Drug induced fever: Secondary | ICD-10-CM

## 2014-06-08 DIAGNOSIS — F329 Major depressive disorder, single episode, unspecified: Secondary | ICD-10-CM | POA: Diagnosis not present

## 2014-06-08 LAB — PROTIME-INR
INR: 2.13 — ABNORMAL HIGH (ref 0.00–1.49)
Prothrombin Time: 23.8 seconds — ABNORMAL HIGH (ref 11.6–15.2)

## 2014-06-08 NOTE — Progress Notes (Signed)
Pt prepared for d/c to SNF. IV d/c'd. Skin intact except as most recently charted. Vitals are stable. Report called to receiving facility. Pt to be transported by ambulance service. 

## 2014-06-08 NOTE — Discharge Summary (Signed)
Physician Discharge Summary  [name] WUJ:[id number] DOB: 1931/08/10 DOA: 05/29/2014  PCP: Eustaquio Boyden, MD  Admit date: 05/29/2014 Discharge date: 06/08/2014  Time spent: 40 minutes  Recommendations for Outpatient Follow-up:  1. Followup with primary care physician within one week. 2. Followup closely for fever development, might need panculture if he spiked a temperature  Discharge Diagnoses:  Principal Problem:   Pyelonephritis Active Problems:   THROMBOCYTOPENIA, PRIMARY NOS   CORONARY ARTERY DISEASE   CKD (chronic kidney disease) stage 3, GFR 30-59 ml/min   HLD (hyperlipidemia)   Depression   Sepsis   Discharge Condition: Stable  Diet recommendation: Heart healthy diet  Filed Weights   06/06/14 0505 06/07/14 0636 06/08/14 0456  Weight: 76.431 kg (168 lb 8 oz) 75.388 kg (166 lb 3.2 oz) 75.388 kg (166 lb 3.2 oz)    History of present illness:  [name] is a 80 y.o. male past medical history of CKD-III, CAD, essential tremor, thrombocytopenia, who presents with urinary incontinence and urgency, and burning on urination.  Patient has been doing well until this morning at about 1 AM, when he had urinary incontinence. When patient got up and went to the bath room in the early morning at about 1:00 AM, he had urinary incontinence and wetted himself. He also has urinary urgency and burning on urination. He had fever with a temperature up to 104.0. He felt generally weak, and fell on the ground. Per his wife, patient did not have paraplegia, did not have weakness or numbness specifically on one side of his body or on any extremities (patient's wife was NP before retired. She is very sure that patient did not have signs of stroke or TIA). Patient had a short lasting left hand tingling sensation which resolved when I saw him in ED. Patient does not have chest pain, shortness of breath, abdominal pain, nausea, vomiting or rashes. Per patient's wife, his blood pressure  normally runs low, the systolic pressure is about ~90 mmHg at home normally.  Patient was found to have positive urinalysis for UTI. Lactic acid 3.01. Blood pressure is running low, slightly higher than SBP 90. IV Rocephin was started in ED. Patient is admitted to inpatient.   Hospital Course:   Sepsis  -Presented with fever of 103.1, documented pyelonephritis. -Sepsis pathophysiology seems to have resolved  -Urine positive for Klebsiella. Finished oral levaquin on 9/30. -Patient was initially on ceftriaxone, switched to Primaxin and then to levofloxacin because of fever. -White was consulted, after patient received 8 days of antibiotics, antibiotics discontinued on 9/29. -Low suspicion if you have patient had have could be secondary to drug induced fever. -Patient fever resolved after antibiotics discontinued. Currently off of antibiotics for 48 hours, no fever.  Pyelonephritis with persistent Fever -On admission had high fever, dysuria and UA was consistent with UTI. Urine culture showed Klebsiella with sensitivity to Rocephin. Was given at first, but discontinued due to concern of drug fever. -Was spiking fevers daily. First afebrile on 9/28, fever returned on 9/29, and afebrile today (9/30). -No CVA tenderness since 9/28. -Renal ultrasound/CT Abd negative for abscess/pyonephroses.  -Blood culture from 9/26- showing no growth. Final results still pending. -Completed Levaquin regimen at 1100 on 9/29.  Back Pain -Had increased back pain since his fall at home 10 days ago. Stated 10 out of 10 upon movement on 9/28 and 9/29. -MRI of back performed due to complaints and unresolved fever despite adequate antibiotic treatment. -MRI showed no evidence of abscess, etc. Did  show some moderate stenosis and broad based disc bulge. -Pain was likely musculoskeletal, from fall and mostly bed bound. -Not currently experiencing pain (9/29).  Acute diastolic heart failure  -Echocardiogram confirmed  diastolic heart failure with preserved EF -CT Chest shows b/l pleural effusion/atelectatis, likely from CHF. -Some lower lobe rales heard on 9/26 to 9/28. Blood culture started on 26th to determine if infectious etiology, especially since patient had fever. No growth to date, final results pending. WBC's have been WNL. -Was treated with IV Lasix. Symptoms are improving. -Did not hear lower lobe fine rales on 9/29 and 9/30. -No lower extremity edema. Still has significant weakness. -Lasix PRN.  Weakness -Had baseline weakness on admission, which has worsened since onset of symptoms. -Needs assistance getting up and unstable while walking -PT eval on 9/30 determined home health rehab. -Wife and daughter concerned for safety at home. -PT/OT recommended SNF, patient somewhat sleepy in the hospital, wife reports that he is back to his baseline. -If patient continues to have sleepiness, considered to take off of gabapentin.  Thrombocytopenia  -likely secondary to acute illness  -platelet slowly improving-check CBC periodically   CAD and Hyperlipidemia  -Stable, with no chest pain  -on atorvastatin   Depression  -Stable no suicidal or homicidal ideation.  -Continue home medications.   Essential tremors  -Continue gabapentin   History of pulmonary embolism  -Coumadin, continue per pharmacy. Per wife there is a strong family hx of death from blood clots.  -The patient coded with his PE in 1990s   CKD-III  -Creatinine improved  -Monitor renal function periodically     Procedures:  None  Consultations:  None  Discharge Exam: Filed Vitals:   06/08/14 0539  BP: 110/60  Pulse: 63  Temp: 98.3 F (36.8 C)  Resp: 16   General: Alert and awake, oriented x3, not in any acute distress. HEENT: anicteric sclera, pupils reactive to light and accommodation, EOMI CVS: S1-S2 clear, no murmur rubs or gallops Chest: clear to auscultation bilaterally, no wheezing, rales or  rhonchi Abdomen: soft nontender, nondistended, normal bowel sounds, no organomegaly Extremities: no cyanosis, clubbing or edema noted bilaterally Neuro: Cranial nerves II-XII intact, no focal neurological deficits  Discharge Instructions You were cared for by a hospitalist during your hospital stay. If you have any questions about your discharge medications or the care you received while you were in the hospital after you are discharged, you can call the unit and asked to speak with the hospitalist on call if the hospitalist that took care of you is not available. Once you are discharged, your primary care physician will handle any further medical issues. Please note that NO REFILLS for any discharge medications will be authorized once you are discharged, as it is imperative that you return to your primary care physician (or establish a relationship with a primary care physician if you do not have one) for your aftercare needs so that they can reassess your need for medications and monitor your lab values.  Discharge Instructions   Diet - low sodium heart healthy    Complete by:  As directed      Increase activity slowly    Complete by:  As directed           Current Discharge Medication List    CONTINUE these medications which have NOT CHANGED   Details  albuterol (PROVENTIL HFA;VENTOLIN HFA) 108 (90 BASE) MCG/ACT inhaler Inhale 2 puffs into the lungs every 6 (six) hours as needed for wheezing  or shortness of breath.    atorvastatin (LIPITOR) 40 MG tablet Take 40 mg by mouth at bedtime.    Cholecalciferol (VITAMIN D) 2000 UNITS CAPS Take 1 capsule by mouth daily.    Coenzyme Q10 (COQ-10) 100 MG CAPS Take 1 capsule by mouth at bedtime.     Cyanocobalamin (B-12 PO) Take 1 tablet by mouth every other day.    dexlansoprazole (DEXILANT) 60 MG capsule Take 60 mg by mouth daily.    fenofibrate (TRICOR) 145 MG tablet Take 145 mg by mouth daily.    fexofenadine (ALLEGRA) 180 MG tablet Take  180 mg by mouth every evening.     gabapentin (NEURONTIN) 100 MG capsule Take 100 mg by mouth 3 (three) times daily.    meclizine (ANTIVERT) 25 MG tablet Take 25 mg by mouth 3 (three) times daily as needed for dizziness.    Multiple Vitamins-Minerals (MULTIVITAMIN PO) Take by mouth daily.    oxybutynin (DITROPAN) 5 MG tablet Take 5 mg by mouth 2 (two) times daily.    valACYclovir (VALTREX) 1000 MG tablet Take 1,000 mg by mouth daily as needed (cold sores).    venlafaxine (EFFEXOR) 75 MG tablet Take 75 mg by mouth daily.    warfarin (COUMADIN) 3 MG tablet Take 1.5-3 mg by mouth daily. 3mg  daily except 1.5mg  on Tuesdays       Allergies  Allergen Reactions  . Lactose Intolerance (Gi)   . Nizatidine     REACTION: Rash (Axid)  . Penicillins     REACTION: Rash  . Sulfadiazine     REACTION: Hives   Follow-up Information   Follow up with Eustaquio Boyden, MD In 1 week.   Specialty:  Family Medicine   Contact information:   [address] [place] [address] [phone]        The results of significant diagnostics from this hospitalization (including imaging, microbiology, ancillary and laboratory) are listed below for reference.    Significant Diagnostic Studies: Ct Abdomen Pelvis Wo Contrast  06/03/2014   CLINICAL DATA:  Fever, urinary incontinence a renal insufficiency.  EXAM: CT CHEST, ABDOMEN AND PELVIS WITHOUT CONTRAST  TECHNIQUE: Multidetector CT imaging of the chest, abdomen and pelvis was performed following the standard protocol without IV contrast.  COMPARISON:  CT scan of July 11, 2010.  FINDINGS: CT CHEST FINDINGS  No pneumothorax is noted. Moderate bilateral pleural effusions are noted with adjacent subsegmental atelectasis. Calcified right hilar adenopathy is noted. Coronary artery calcifications are noted consistent with coronary artery disease.  CT ABDOMEN AND PELVIS FINDINGS  Minimal cholelithiasis is noted. No focal abnormality is noted in the liver  or pancreas on these unenhanced images. Small splenic granulomata are noted. Adrenal glands appear normal. No hydronephrosis or renal obstruction is noted. No renal or ureteral calculi are noted. Mild urinary bladder distention is noted. 2.9 cm simple cyst arises posteriorly from the left kidney. Atherosclerotic calcifications of abdominal aorta are noted without aneurysm formation. There is no evidence of bowel obstruction. No abnormal fluid collection is noted. No significant adenopathy is noted.  IMPRESSION: Moderate bilateral pleural effusions are noted with adjacent subsegmental atelectasis.  Calcified right hilar adenopathy is noted as well as splenic granulomata suggesting prior granulomatous disease.  No hydronephrosis or renal obstruction is noted. No renal or ureteral calculi are noted.  Minimal cholelithiasis is noted.  Coronary artery calcifications are noted consistent with coronary artery disease.   Electronically Signed   By: Roque Lias M.D.   On: 06/03/2014 17:17  Dg Chest 2 View  06/02/2014   CLINICAL DATA:  Cough and shortness of breath.  EXAM: CHEST  2 VIEW  COMPARISON:  05/29/2014 and 03/21/2013.  FINDINGS: Trachea is midline. Heart size stable. There is mixed interstitial and airspace disease, basilar dependent, new from 05/29/2014. Small bilateral effusions. Flowing anterior osteophytosis in the thoracic spine.  IMPRESSION: New mixed interstitial and airspace disease, basilar dependent, with bilateral effusions. Findings may be due to congestive heart failure. Pneumonia not excluded.   Electronically Signed   By: Leanna Battles M.D.   On: 06/02/2014 13:07   Dg Chest 2 View  05/29/2014   CLINICAL DATA:  Fever, UTI  EXAM: CHEST  2 VIEW  COMPARISON:  03/21/2013  FINDINGS: Lungs are essentially clear. No focal consolidation. No pleural effusion or pneumothorax.  The heart is normal in size.  Calcified right perihilar nodes.  Degenerative changes of the visualized thoracolumbar spine.   IMPRESSION: No evidence of acute cardiopulmonary disease.   Electronically Signed   By: Charline Bills M.D.   On: 05/29/2014 19:18   Ct Chest Wo Contrast  06/03/2014   CLINICAL DATA:  Fever, urinary incontinence a renal insufficiency.  EXAM: CT CHEST, ABDOMEN AND PELVIS WITHOUT CONTRAST  TECHNIQUE: Multidetector CT imaging of the chest, abdomen and pelvis was performed following the standard protocol without IV contrast.  COMPARISON:  CT scan of July 11, 2010.  FINDINGS: CT CHEST FINDINGS  No pneumothorax is noted. Moderate bilateral pleural effusions are noted with adjacent subsegmental atelectasis. Calcified right hilar adenopathy is noted. Coronary artery calcifications are noted consistent with coronary artery disease.  CT ABDOMEN AND PELVIS FINDINGS  Minimal cholelithiasis is noted. No focal abnormality is noted in the liver or pancreas on these unenhanced images. Small splenic granulomata are noted. Adrenal glands appear normal. No hydronephrosis or renal obstruction is noted. No renal or ureteral calculi are noted. Mild urinary bladder distention is noted. 2.9 cm simple cyst arises posteriorly from the left kidney. Atherosclerotic calcifications of abdominal aorta are noted without aneurysm formation. There is no evidence of bowel obstruction. No abnormal fluid collection is noted. No significant adenopathy is noted.  IMPRESSION: Moderate bilateral pleural effusions are noted with adjacent subsegmental atelectasis.  Calcified right hilar adenopathy is noted as well as splenic granulomata suggesting prior granulomatous disease.  No hydronephrosis or renal obstruction is noted. No renal or ureteral calculi are noted.  Minimal cholelithiasis is noted.  Coronary artery calcifications are noted consistent with coronary artery disease.   Electronically Signed   By: Roque Lias M.D.   On: 06/03/2014 17:17   Mr Lumbar Spine W Wo Contrast  06/06/2014   CLINICAL DATA:  Pt began complaining of back pain  [birth year] y.o. male past medical history of CKD-III, CAD, essential tremor, thrombocytopenia, who presents with urinary incontinence and urgency, and burning on urination. Patient has been doing well until this morning at about 1 AM, when he had urinary incontinence. When patient got up and went to the bath room in the early morning at about 1:00 AM, he had urinary incontinence and wetted himself. He also has urinary urgency and burning on urination. He had fever with a temperature up to 104.0. He felt generally weak, and fell on the ground. Per his wife, patient did not have paraplegia, did not have weakness or numbness specifically on one side of his body or on any extremities (patient's wife was NP before retired. She is very sure that patient did not have signs of stroke or  TIA). Patient had a short lasting left hand tingling sensation which resolved when I saw him in ED. Patient does not have chest pain, shortness of breath, abdominal pain, nausea, vomiting or rashes. Per patient's wife, his blood pressure normally runs low, the systolic pressure is about ~90 mmHg at home normally.  EXAM: MRI LUMBAR SPINE WITHOUT AND WITH CONTRAST  TECHNIQUE: Multiplanar and multiecho pulse sequences of the lumbar spine were obtained without and with intravenous contrast.  CONTRAST:  15mL MULTIHANCE GADOBENATE DIMEGLUMINE 529 MG/ML IV SOLN  COMPARISON:  None.  FINDINGS: The vertebral bodies of the lumbar spine are normal in size. The vertebral bodies of the lumbar spine are normal in alignment. Marrow heterogeneity is present. Although this can be caused by marrow infiltrative processes, the most common causes include chronic renal disease, anemia, smoking, obesity, or advancing age. The intervertebral disc spaces are well-maintained.  The spinal cord is normal in signal and contour. The cord terminates normally at L1 . The nerve roots of the cauda equina and the filum terminale are normal.  The visualized portions of the SI  joints are unremarkable.  There is a stable left renal cyst.  T12-L1: No significant disc bulge. No evidence of neural foraminal stenosis. No central canal stenosis.  L1-L2: No significant disc bulge. No evidence of neural foraminal stenosis. No central canal stenosis.  L2-L3: No significant disc bulge. No evidence of neural foraminal stenosis. No central canal stenosis.  L3-L4: No significant disc bulge. No evidence of neural foraminal stenosis. No central canal stenosis.  L4-L5: Moderate broad-based disc ulge. Severe bilateral facet arthropathy with ligamentum flavum infolding resulting in moderate spinal stenosis and bilateral lateral recess stenosis. No evidence of neural foraminal stenosis.  L5-S1: No significant disc bulge. No evidence of neural foraminal stenosis. No central canal stenosis.  IMPRESSION: 1. At L4-5 there is moderate broad-based disc bulge with severe bilateral facet arthropathy and ligamentum flavum infolding resulting in moderate spinal stenosis and bilateral lateral recess stenosis. 2. Marrow heterogeneity is present. Although this can be caused by marrow infiltrative processes, the most common causes include chronic renal disease, anemia, smoking, obesity or advancing age.   Electronically Signed   By: Elige Ko   On: 06/06/2014 13:45   US Renal  06/01/2014   CLINICAL DATA:  Pyelonephritis. Persistent fever. Question of abscess. Prior cystoscopy. Pre diabetes.  EXAM: RENAL/URINARY TRACT ULTRASOUND COMPLETE  COMPARISON:  Ultrasound of the abdomen on 08/23/2010  FINDINGS: Right Kidney:  Length: 11.1 cm. There is renal parenchymal thinning. No hydronephrosis. Renal echogenicity is normal. No focal mass.  Left Kidney:  Length: 11.4 cm in length. Renal parenchymal thinning. No hydronephrosis. Cyst is 3.4 x 2.9 x 3.0 cm and upper pole region.  Bladder:  Appears normal for degree of bladder distention.  IMPRESSION: 1. No hydronephrosis. 2. Left renal cyst. 3. No abscess identified.    Electronically Signed   By: Rosalie Gums M.D.   On: 06/01/2014 10:52    Microbiology: Recent Results (from the past 240 hour(s))  CULTURE, BLOOD (ROUTINE X 2)     Status: None   Collection Time    05/29/14  5:20 PM      Result Value Ref Range Status   Specimen Description BLOOD RIGHT ANTECUBITAL   Final   Special Requests BOTTLES DRAWN AEROBIC ONLY 10CC   Final   Culture  Setup Time     Final   Value: 05/29/2014 22:30     Performed at [facility]  Culture     Final   Value: NO GROWTH 5 DAYS     Performed at [facility]   Report Status 06/04/2014 FINAL   Final  CULTURE, BLOOD (ROUTINE X 2)     Status: None   Collection Time    05/29/14  5:21 PM      Result Value Ref Range Status   Specimen Description BLOOD LEFT ANTECUBITAL   Final   Special Requests BOTTLES DRAWN AEROBIC ONLY 5CC   Final   Culture  Setup Time     Final   Value: 05/29/2014 22:31     Performed at [facility]   Culture     Final   Value: NO GROWTH 5 DAYS     Performed at [facility]   Report Status 06/04/2014 FINAL   Final  URINE CULTURE     Status: None   Collection Time    05/29/14  5:53 PM      Result Value Ref Range Status   Specimen Description URINE, CATHETERIZED   Final   Special Requests NONE   Final   Culture  Setup Time     Final   Value: 05/30/2014 01:49     Performed at [facility] Count     Final   Value: >=100,000 COLONIES/ML     Performed at [facility]   Culture     Final   Value: KLEBSIELLA OXYTOCA     Performed at [facility]   Report Status 05/31/2014 FINAL   Final   Organism ID, Bacteria KLEBSIELLA OXYTOCA   Final  CULTURE, BLOOD (ROUTINE X 2)     Status: None   Collection Time    06/03/14  9:25 AM      Result Value Ref Range Status   Specimen Description BLOOD LEFT ARM   Final   Special Requests BOTTLES DRAWN AEROBIC AND ANAEROBIC 10CC   Final   Culture  Setup Time     Final   Value: 06/03/2014 20:17      Performed at [facility]   Culture     Final   Value:        BLOOD CULTURE RECEIVED NO GROWTH TO DATE CULTURE WILL BE HELD FOR 5 DAYS BEFORE ISSUING A FINAL NEGATIVE REPORT     Performed at [facility]   Report Status PENDING   Incomplete  CULTURE, BLOOD (ROUTINE X 2)     Status: None   Collection Time    06/03/14  9:35 AM      Result Value Ref Range Status   Specimen Description BLOOD RIGHT ARM   Final   Special Requests BOTTLES DRAWN AEROBIC AND ANAEROBIC 10CC   Final   Culture  Setup Time     Final   Value: 06/03/2014 20:17     Performed at [facility]   Culture     Final   Value:        BLOOD CULTURE RECEIVED NO GROWTH TO DATE CULTURE WILL BE HELD FOR 5 DAYS BEFORE ISSUING A FINAL NEGATIVE REPORT     Performed at [facility]   Report Status PENDING   Incomplete     Labs: Basic Metabolic Panel:  Recent Labs Lab 06/03/14 0650 06/04/14 0548 06/05/14 0735 06/07/14 0718  NA 142 139 138 137  K 3.8 4.1 4.4 4.2  CL 105 102 101 103  CO2 24 26 30 23   GLUCOSE 107*  97 103* 102*  BUN 26* 26* 26* 24*  CREATININE 1.43* 1.34 1.29 1.33  CALCIUM 8.6 8.4 8.3* 8.6   Liver Function Tests:  Recent Labs Lab 06/03/14 0650  AST 73*  ALT 42  ALKPHOS 65  BILITOT 0.8  PROT 5.6*  ALBUMIN 2.4*   No results found for this basename: LIPASE, AMYLASE,  in the last 168 hours No results found for this basename: AMMONIA,  in the last 168 hours CBC:  Recent Labs Lab 06/03/14 0650 06/05/14 0735 06/07/14 0718  WBC 7.0 5.5 6.4  HGB 10.4* 9.5* 9.8*  HCT 30.7* 28.2* 29.0*  MCV 105.1* 103.3* 106.6*  PLT 87* 116* 146*   Cardiac Enzymes:  Recent Labs Lab 06/03/14 0650  CKTOTAL 55   BNP: BNP (last 3 results)  Recent Labs  06/03/14 0650  PROBNP 6915.0*   CBG: No results found for this basename: GLUCAP,  in the last 168 hours     Signed:  Prather Failla A  [facility] 06/08/2014, 11:25 AM

## 2014-06-08 NOTE — Clinical Social Work Placement (Signed)
Clinical Social Work Department CLINICAL SOCIAL WORK PLACEMENT NOTE 06/08/2014  Patient:  [name], [name]  Account Number:  [id number] Admit date:  05/29/2014  Clinical Social Worker:  Lovey Newcomer  Date/time:  06/07/2014 06:23 PM  Clinical Social Work is seeking post-discharge placement for this patient at the following level of care:   SKILLED NURSING   (*CSW will update this form in Epic as items are completed)   06/07/2014  Patient/family provided with [place] Department of Clinical Social Work's list of facilities offering this level of care within the geographic area requested by the patient (or if unable, by the patient's family).  06/07/2014  Patient/family informed of their freedom to choose among providers that offer the needed level of care, that participate in Medicare, Medicaid or managed care program needed by the patient, have an available bed and are willing to accept the patient.  06/07/2014  Patient/family informed of [facility]' ownership interest in [facility], as well as of the fact that they are under no obligation to receive care at this facility.  PASARR submitted to EDS on 06/07/2014 PASARR number received on 06/07/2014  FL2 transmitted to all facilities in geographic area requested by pt/family on  06/07/2014 FL2 transmitted to all facilities within larger geographic area on   Patient informed that his/her managed care company has contracts with or will negotiate with  certain facilities, including the following:     Patient/family informed of bed offers received:  06/07/2014 Patient chooses bed at [facility] Physician recommends and patient chooses bed at    Patient to be transferred to [place] on  06/08/2014 Patient to be transferred to facility by Ambulance Patient and family notified of transfer on 06/08/2014 Name of family member notified:  [name] and Belenda Cruise  The following physician request were  entered in Epic:   Additional Comments:   Per MD patient ready for DC to [facility]. RN, patient, patient's family, and facility notified of DC. RN given number for report. DC packet on chart. [facility] transport requested for patient. CSW signing off.    Liz Beach MSW, [place], [place], [phone]

## 2014-06-08 NOTE — Telephone Encounter (Addendum)
Pt discharged 06/08/2014 after hospital stay with pyelonephritis and prolonged fever thought drug induced. plz call tomorrow for hosp f/u phone call and schedule hosp f/u next week. Ensure no recurrent fever. If pt in SNF may call to schedule f/u appt once discharged from SNF.

## 2014-06-08 NOTE — Progress Notes (Signed)
Physical Therapy Treatment Patient Details Name: [name] MRN: [id number] DOB: 12-Oct-1930 Today's Date: 06/08/2014    History of Present Illness Pt admitted with weakness and fever along with urinary incontinence urgency and frequency.  Pt was difficult to manage by wife in this state and pt fell comin out of the bathroom.      PT Comments    Pt is progressing, although slowly; He remains quite deconditioned, at baseline he is able to amb with cane, walk his dog, etc; Pt will benefit from SNF level therapies and is very motivated for such; Will follow   Follow Up Recommendations  SNF     Equipment Recommendations  Rolling walker with 5" wheels    Recommendations for Other Services       Precautions / Restrictions Precautions Precautions: Fall Precaution Comments: pt has tremor    Mobility  Bed Mobility Overal bed mobility: Needs Assistance Bed Mobility: Supine to Sit     Supine to sit: HOB elevated;Min guard     General bed mobility comments: cues for sequence with assist to fully bring legs off surface and elevate trunk; encouraged wife to avoid assisting him , make him to the work  Transfers Overall transfer level: Needs assistance Equipment used: Rolling walker (2 wheeled) Transfers: Sit to/from [employer] to Stand: Min guard Stand pivot transfers: Min assist       General transfer comment: Cues for hand placement and safety with sitting  Ambulation/Gait Ambulation/Gait assistance: Min guard;Supervision Ambulation Distance (Feet): 150 Feet Assistive device: Rolling walker (2 wheeled) Gait Pattern/deviations: Step-to pattern;Step-through pattern;Decreased stride length;Trunk flexed Gait velocity: one standing rest during distance above; pt with 3/4 DOE   General Gait Details:  verbal cues for RW distance from self, tactile cues for trunk extension/upright posture   Stairs            Wheelchair Mobility    Modified  Rankin (Stroke Patients Only)       Balance Overall balance assessment: Needs assistance;History of Falls Sitting-balance support: Bilateral upper extremity supported;No upper extremity supported;Feet supported Sitting balance-Leahy Scale: Good Sitting balance - Comments: pt able to reach outside BOS and return without LOB, close supervision, pt reluctant to shift wt forward   Standing balance support: Bilateral upper extremity supported;During functional activity Standing balance-Leahy Scale: Poor                      Cognition Arousal/Alertness: Awake/alert Behavior During Therapy: WFL for tasks assessed/performed Overall Cognitive Status: Within Functional Limits for tasks assessed                      Exercises General Exercises - Lower Extremity Quad Sets: AROM;Strengthening;Both;10 reps Long Arc Quad: AROM;Seated;Both;15 reps    General Comments General comments (skin integrity, edema, etc.): pt wife assists  with pericare and gives pt his pills, reports she does not have to do these thing sfor him at home      Pertinent Vitals/Pain Pain Assessment: No/denies pain    Home Living                      Prior Function            PT Goals (current goals can now be found in the care plan section) Acute Rehab PT Goals Patient Stated Goal: wife wants him to be more self sufficient and mobile PT Goal Formulation: With patient Time For Goal Achievement: 06/12/14 Potential  to Achieve Goals: Good Progress towards PT goals: Progressing toward goals    Frequency  Min 3X/week    PT Plan Current plan remains appropriate    Co-evaluation             End of Session Equipment Utilized During Treatment: Gait belt Activity Tolerance: Patient tolerated treatment well;Other (comment) Patient left: in chair;with call bell/phone within reach;with family/visitor present     Time: 1010-1048 PT Time Calculation (min): 38 min  Charges:  $Gait  Training: 8-22 mins $Therapeutic Activity: 23-37 mins                    G Codes:      [name] 16-Jun-2014, 10:54 AM

## 2014-06-09 LAB — CULTURE, BLOOD (ROUTINE X 2)
CULTURE: NO GROWTH
Culture: NO GROWTH

## 2014-06-09 NOTE — Telephone Encounter (Signed)
Spoke to pts wife who states that pt is at [facility]. Unaware of temperature status; will sched a hosp f/u when therapy is complete

## 2014-06-09 NOTE — Telephone Encounter (Signed)
Left message on home phone to call back, unable to leave message on voicemail because it has not been set up. Left message on patient's wife cell phone to call back.

## 2014-06-09 NOTE — Telephone Encounter (Signed)
Pt spouse returned call. Please call Kelby Aline" at [phone]

## 2014-06-13 DIAGNOSIS — Z7901 Long term (current) use of anticoagulants: Secondary | ICD-10-CM | POA: Diagnosis not present

## 2014-06-13 DIAGNOSIS — N12 Tubulo-interstitial nephritis, not specified as acute or chronic: Secondary | ICD-10-CM | POA: Diagnosis not present

## 2014-06-13 DIAGNOSIS — I2510 Atherosclerotic heart disease of native coronary artery without angina pectoris: Secondary | ICD-10-CM | POA: Diagnosis not present

## 2014-06-13 DIAGNOSIS — F329 Major depressive disorder, single episode, unspecified: Secondary | ICD-10-CM | POA: Diagnosis not present

## 2014-06-19 ENCOUNTER — Ambulatory Visit: Payer: Medicare Other

## 2014-06-23 ENCOUNTER — Other Ambulatory Visit: Payer: Self-pay | Admitting: Family Medicine

## 2014-07-03 ENCOUNTER — Ambulatory Visit (INDEPENDENT_AMBULATORY_CARE_PROVIDER_SITE_OTHER): Payer: Medicare Other | Admitting: *Deleted

## 2014-07-03 DIAGNOSIS — Z5181 Encounter for therapeutic drug level monitoring: Secondary | ICD-10-CM

## 2014-07-03 DIAGNOSIS — Z7901 Long term (current) use of anticoagulants: Secondary | ICD-10-CM

## 2014-07-03 DIAGNOSIS — I82409 Acute embolism and thrombosis of unspecified deep veins of unspecified lower extremity: Secondary | ICD-10-CM

## 2014-07-03 LAB — POCT INR: INR: 3.5

## 2014-07-04 DIAGNOSIS — M62830 Muscle spasm of back: Secondary | ICD-10-CM | POA: Diagnosis not present

## 2014-07-04 DIAGNOSIS — M955 Acquired deformity of pelvis: Secondary | ICD-10-CM | POA: Diagnosis not present

## 2014-07-04 DIAGNOSIS — M9903 Segmental and somatic dysfunction of lumbar region: Secondary | ICD-10-CM | POA: Diagnosis not present

## 2014-07-04 DIAGNOSIS — M9905 Segmental and somatic dysfunction of pelvic region: Secondary | ICD-10-CM | POA: Diagnosis not present

## 2014-07-06 ENCOUNTER — Ambulatory Visit (INDEPENDENT_AMBULATORY_CARE_PROVIDER_SITE_OTHER): Payer: Medicare Other | Admitting: Family Medicine

## 2014-07-06 ENCOUNTER — Encounter: Payer: Self-pay | Admitting: Family Medicine

## 2014-07-06 VITALS — BP 100/60 | HR 80 | Temp 98.1°F | Wt 163.5 lb

## 2014-07-06 DIAGNOSIS — G250 Essential tremor: Secondary | ICD-10-CM

## 2014-07-06 DIAGNOSIS — N183 Chronic kidney disease, stage 3 unspecified: Secondary | ICD-10-CM

## 2014-07-06 DIAGNOSIS — D696 Thrombocytopenia, unspecified: Secondary | ICD-10-CM | POA: Diagnosis not present

## 2014-07-06 DIAGNOSIS — N390 Urinary tract infection, site not specified: Secondary | ICD-10-CM

## 2014-07-06 DIAGNOSIS — R269 Unspecified abnormalities of gait and mobility: Secondary | ICD-10-CM

## 2014-07-06 DIAGNOSIS — I2510 Atherosclerotic heart disease of native coronary artery without angina pectoris: Secondary | ICD-10-CM

## 2014-07-06 DIAGNOSIS — N12 Tubulo-interstitial nephritis, not specified as acute or chronic: Secondary | ICD-10-CM | POA: Diagnosis not present

## 2014-07-06 LAB — CBC WITH DIFFERENTIAL/PLATELET
BASOS ABS: 0 10*3/uL (ref 0.0–0.1)
BASOS PCT: 0.5 % (ref 0.0–3.0)
EOS ABS: 0.1 10*3/uL (ref 0.0–0.7)
Eosinophils Relative: 2.5 % (ref 0.0–5.0)
HCT: 34 % — ABNORMAL LOW (ref 39.0–52.0)
HEMOGLOBIN: 11.4 g/dL — AB (ref 13.0–17.0)
LYMPHS ABS: 1.4 10*3/uL (ref 0.7–4.0)
Lymphocytes Relative: 33 % (ref 12.0–46.0)
MCHC: 33.5 g/dL (ref 30.0–36.0)
MCV: 108.1 fl — AB (ref 78.0–100.0)
MONO ABS: 0.3 10*3/uL (ref 0.1–1.0)
Monocytes Relative: 7.3 % (ref 3.0–12.0)
NEUTROS ABS: 2.4 10*3/uL (ref 1.4–7.7)
Neutrophils Relative %: 56.7 % (ref 43.0–77.0)
Platelets: 145 10*3/uL — ABNORMAL LOW (ref 150.0–400.0)
RBC: 3.15 Mil/uL — AB (ref 4.22–5.81)
RDW: 16.7 % — AB (ref 11.5–15.5)
WBC: 4.2 10*3/uL (ref 4.0–10.5)

## 2014-07-06 LAB — BASIC METABOLIC PANEL
BUN: 21 mg/dL (ref 6–23)
CALCIUM: 9.6 mg/dL (ref 8.4–10.5)
CO2: 28 mEq/L (ref 19–32)
Chloride: 104 mEq/L (ref 96–112)
Creatinine, Ser: 1.7 mg/dL — ABNORMAL HIGH (ref 0.4–1.5)
GFR: 40.77 mL/min — ABNORMAL LOW (ref >60.00)
Glucose, Bld: 100 mg/dL — ABNORMAL HIGH (ref 70–99)
Potassium: 4.9 mEq/L (ref 3.5–5.1)
Sodium: 136 mEq/L (ref 135–145)

## 2014-07-06 NOTE — Assessment & Plan Note (Signed)
Continue gabapentin.

## 2014-07-06 NOTE — Patient Instructions (Addendum)
Blood work today. Pass by [name]'s office to schedule outpatient physical therapy/occupational therapy Good to see you today, call us with questions. Return in 4-5 months for medicare wellness visit

## 2014-07-06 NOTE — Progress Notes (Addendum)
BP 100/60  Pulse 80  Temp(Src) 98.1 F (36.7 C) (Oral)  Wt 163 lb 8 oz (74.163 kg)   CC: hosp and rehab f/u visit  Subjective:    Patient ID: [name], male    DOB: [birth year], 80 y.o.   MRN: [id number]  HPI: [name] is a 80 y.o. male presenting on 07/06/2014 for Follow-up   Pt discharged 06/08/2014 after hospital stay with pyelonephritis and prolonged fever thought drug induced. Records reviewed.  Walks with cane, feeling shakey with this. Has rollator but prefers to use cane.  Stayed 3 wks at [facility]. Worked with PT/OT while there. Would like to restart outpt therapy at [facility] - was previously going there.   No fevers since he's been home. No UTI sxs either. Lab Results  Component Value Date   INR 3.5 07/03/2014   INR 2.13* 06/08/2014   INR 1.98* 06/07/2014   Wt Readings from Last 3 Encounters:  07/06/14 163 lb 8 oz (74.163 kg)  06/08/14 166 lb 3.2 oz (75.388 kg)  04/12/14 171 lb (77.565 kg)    Admit date: 05/29/2014  Discharge date: 06/08/2014  Recommendations for Outpatient Follow-up:  1. Followup with primary care physician within one week. 2. Followup closely for fever development, might need panculture if he spiked a temperature Discharge Diagnoses:  Principal Problem:  Pyelonephritis  Active Problems:  THROMBOCYTOPENIA, PRIMARY NOS  CORONARY ARTERY DISEASE  CKD (chronic kidney disease) stage 3, GFR 30-59 ml/min  HLD (hyperlipidemia)  Depression  Sepsis   Relevant past medical, surgical, family and social history reviewed and updated as indicated.  Allergies and medications reviewed and updated. Current Outpatient Prescriptions on File Prior to Visit  Medication Sig  . albuterol (PROVENTIL HFA;VENTOLIN HFA) 108 (90 BASE) MCG/ACT inhaler Inhale 2 puffs into the lungs every 6 (six) hours as needed for wheezing or shortness of breath.  Marland Kitchen atorvastatin (LIPITOR) 40 MG tablet Take 40 mg by mouth at bedtime.  . Cholecalciferol (VITAMIN  D) 2000 UNITS CAPS Take 1 capsule by mouth daily.  . Coenzyme Q10 (COQ-10) 100 MG CAPS Take 1 capsule by mouth at bedtime.   . Cyanocobalamin (B-12 PO) Take 1 tablet by mouth every other day.  Marland Kitchen dexlansoprazole (DEXILANT) 60 MG capsule Take 60 mg by mouth daily.  . fenofibrate (TRICOR) 145 MG tablet TAKE 1 TABLET DAILY  . fexofenadine (ALLEGRA) 180 MG tablet Take 180 mg by mouth every evening.   . gabapentin (NEURONTIN) 100 MG capsule Take 100 mg by mouth 3 (three) times daily.  . meclizine (ANTIVERT) 25 MG tablet Take 25 mg by mouth 3 (three) times daily as needed for dizziness.  . Multiple Vitamins-Minerals (MULTIVITAMIN PO) Take by mouth daily.  Marland Kitchen oxybutynin (DITROPAN) 5 MG tablet Take 5 mg by mouth 2 (two) times daily.  . valACYclovir (VALTREX) 1000 MG tablet Take 1,000 mg by mouth daily as needed (cold sores).  . venlafaxine (EFFEXOR) 75 MG tablet Take 75 mg by mouth daily.  Marland Kitchen warfarin (COUMADIN) 3 MG tablet Take 1.5-3 mg by mouth daily. 3mg  daily except 1.5mg  on Tuesdays   No current facility-administered medications on file prior to visit.    Review of Systems Per HPI unless specifically indicated above    Objective:    BP 100/60  Pulse 80  Temp(Src) 98.1 F (36.7 C) (Oral)  Wt 163 lb 8 oz (74.163 kg)  Physical Exam  Nursing note and vitals reviewed. Constitutional: He appears well-nourished. No distress.  HENT:  Mouth/Throat: Oropharynx is clear and moist. No oropharyngeal exudate.  Eyes: Conjunctivae are normal. Pupils are equal, round, and reactive to light.  Neck: Normal range of motion. Neck supple.  Cardiovascular: Normal rate, regular rhythm, normal heart sounds and intact distal pulses.   No murmur heard. Pulmonary/Chest: Effort normal and breath sounds normal. No respiratory distress. He has no wheezes. He has no rales.  Musculoskeletal: He exhibits no edema.  Neurological:  Intention tremor present Unsteady on feet, walking with cane  Skin: Skin is warm and  dry. No rash noted.  Psychiatric: He has a normal mood and affect.   Results for orders placed in visit on 07/03/14  POCT INR      Result Value Ref Range   INR 3.5        Assessment & Plan:   Problem List Items Addressed This Visit   UNSTEADY GAIT     Encouraged he use rollator more regularly - more stability noted when using this. Refer to outpatient PT/OT.    Relevant Orders      Ambulatory referral to Physical Therapy   Thrombocytopenia     Recheck CBC. Anticipate ITP.    Pyelonephritis     sxs resolved - doesn't need UA checked today. No further fevers, LUTS.    Essential tremor     Continue gabapentin.    Relevant Orders      Ambulatory referral to Physical Therapy   CKD (chronic kidney disease) stage 3, GFR 30-59 ml/min - Primary     Check Cr today.    Relevant Orders      CBC with Differential      Basic metabolic panel      Ambulatory referral to Physical Therapy       Follow up plan: Return as needed, for annual exam, prior fasting for blood work.

## 2014-07-06 NOTE — Progress Notes (Signed)
Pre visit review using our clinic review tool, if applicable. No additional management support is needed unless otherwise documented below in the visit note. 

## 2014-07-06 NOTE — Assessment & Plan Note (Signed)
sxs resolved - doesn't need UA checked today. No further fevers, LUTS.

## 2014-07-06 NOTE — Assessment & Plan Note (Signed)
Check Cr today. 

## 2014-07-06 NOTE — Assessment & Plan Note (Signed)
Encouraged he use rollator more regularly - more stability noted when using this. Refer to outpatient PT/OT.

## 2014-07-06 NOTE — Assessment & Plan Note (Signed)
Recheck CBC. Anticipate ITP.

## 2014-07-06 NOTE — Addendum Note (Signed)
Addended by: Ria Bush on: 07/06/2014 12:10 PM   Modules accepted: Orders

## 2014-07-08 ENCOUNTER — Encounter: Payer: Self-pay | Admitting: Family Medicine

## 2014-07-08 ENCOUNTER — Other Ambulatory Visit: Payer: Self-pay | Admitting: Family Medicine

## 2014-07-08 DIAGNOSIS — D539 Nutritional anemia, unspecified: Secondary | ICD-10-CM

## 2014-07-08 DIAGNOSIS — N183 Chronic kidney disease, stage 3 unspecified: Secondary | ICD-10-CM

## 2014-07-08 DIAGNOSIS — D696 Thrombocytopenia, unspecified: Secondary | ICD-10-CM

## 2014-07-09 IMAGING — CR DG ABDOMEN 1V
1 series · 1 of 1 positions shown · non-contrast
Comparison: CT of the abdomen and pelvis 07/11/2010.

CLINICAL DATA: Right-sided flank pain.  History of kidney stones.

ABDOMEN - 1 VIEW

[view not recorded]
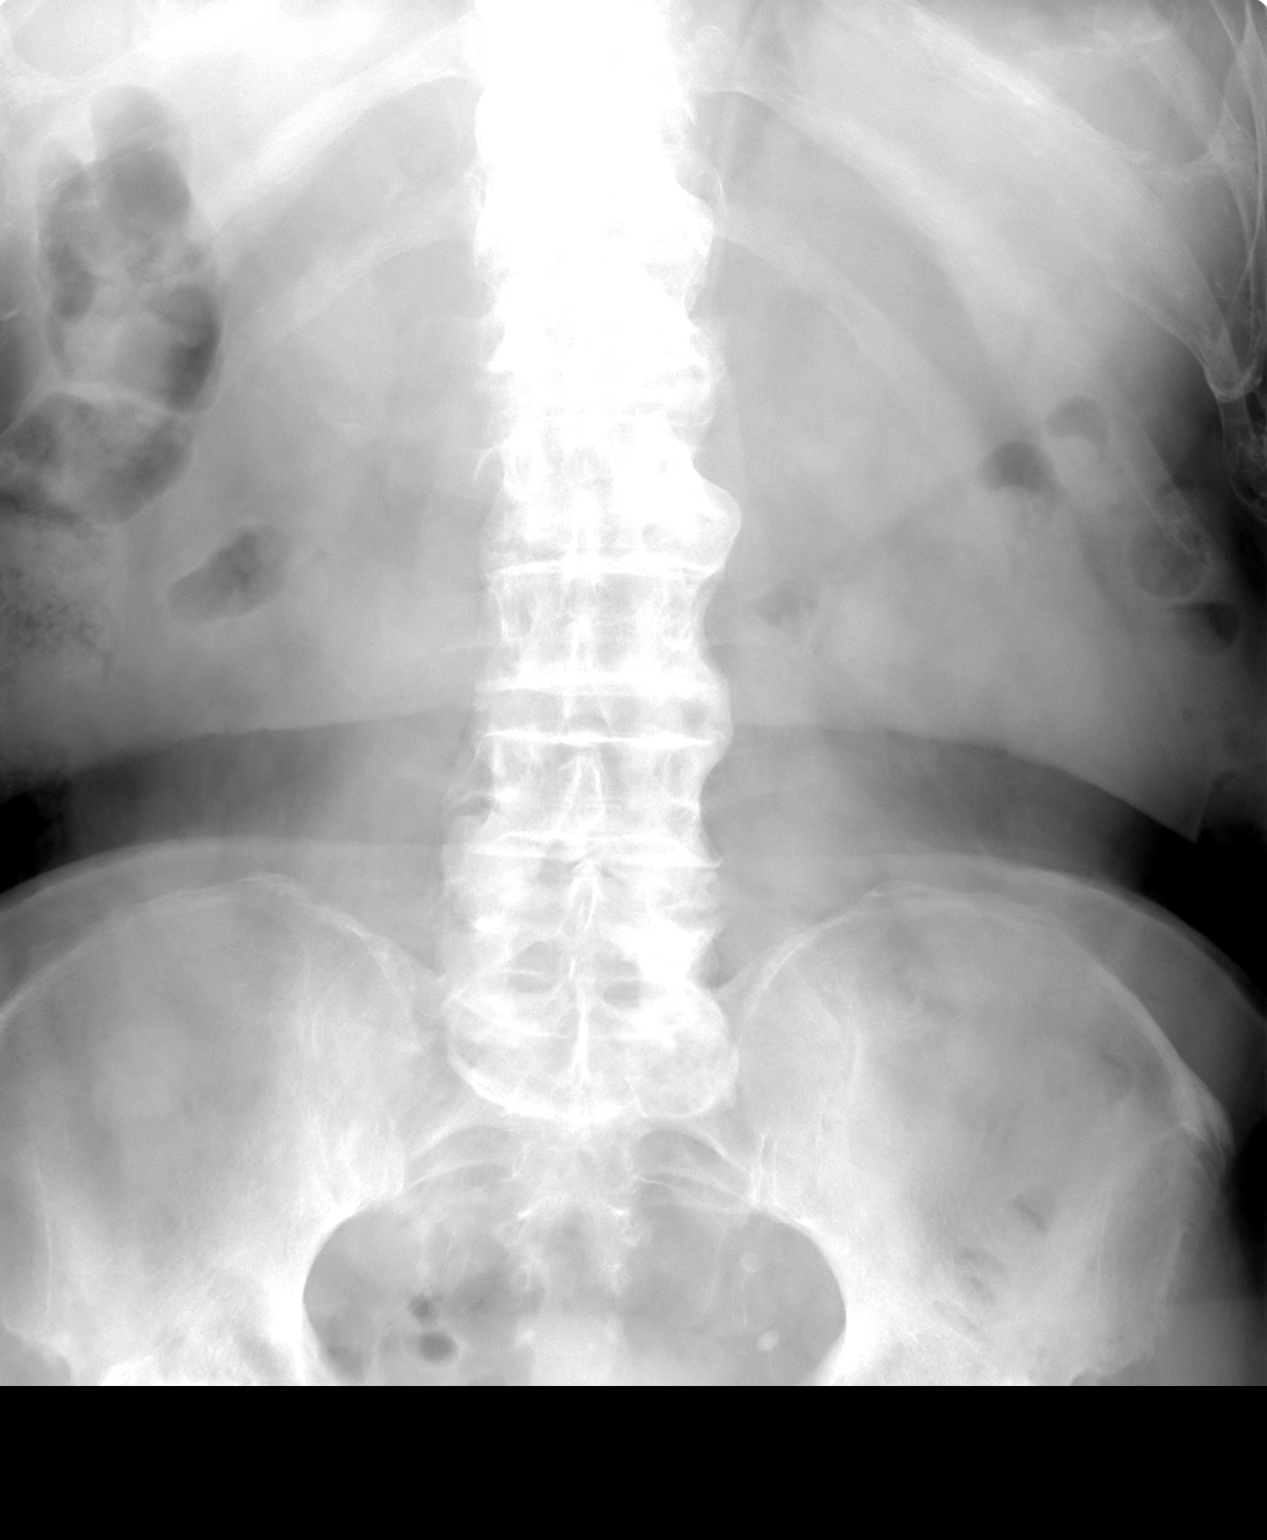

[1 of 1 positions shown; findings below may reference images not displayed]

FINDINGS: Single view of the abdomen demonstrates no definite
calcifications projecting over either kidney or the expected course
of either ureter bilaterally.  Multiple pelvic phleboliths are
noted bilaterally.  Visualized bowel gas pattern is nonobstructive.
Orthopedic fixation hardware in the right proximal femur is
incompletely visualized.
IMPRESSION: 1.  No definite urinary tract calculi noted.

## 2014-07-10 DIAGNOSIS — M9903 Segmental and somatic dysfunction of lumbar region: Secondary | ICD-10-CM | POA: Diagnosis not present

## 2014-07-10 DIAGNOSIS — M955 Acquired deformity of pelvis: Secondary | ICD-10-CM | POA: Diagnosis not present

## 2014-07-10 DIAGNOSIS — M62830 Muscle spasm of back: Secondary | ICD-10-CM | POA: Diagnosis not present

## 2014-07-10 DIAGNOSIS — M9905 Segmental and somatic dysfunction of pelvic region: Secondary | ICD-10-CM | POA: Diagnosis not present

## 2014-07-11 ENCOUNTER — Other Ambulatory Visit (INDEPENDENT_AMBULATORY_CARE_PROVIDER_SITE_OTHER): Payer: Medicare Other

## 2014-07-11 DIAGNOSIS — D696 Thrombocytopenia, unspecified: Secondary | ICD-10-CM

## 2014-07-11 DIAGNOSIS — D539 Nutritional anemia, unspecified: Secondary | ICD-10-CM | POA: Diagnosis not present

## 2014-07-11 DIAGNOSIS — N183 Chronic kidney disease, stage 3 unspecified: Secondary | ICD-10-CM

## 2014-07-11 LAB — VITAMIN B12

## 2014-07-12 LAB — PATHOLOGIST SMEAR REVIEW

## 2014-07-13 LAB — PROTEIN ELECTROPHORESIS, SERUM
ALPHA-1-GLOBULIN: 3.5 % (ref 2.9–4.9)
Albumin ELP: 58.9 % (ref 55.8–66.1)
Alpha-2-Globulin: 9.4 % (ref 7.1–11.8)
Beta 2: 5.2 % (ref 3.2–6.5)
Beta Globulin: 7.4 % — ABNORMAL HIGH (ref 4.7–7.2)
Gamma Globulin: 15.6 % (ref 11.1–18.8)
Total Protein, Serum Electrophoresis: 6.4 g/dL (ref 6.0–8.3)

## 2014-07-17 ENCOUNTER — Other Ambulatory Visit (INDEPENDENT_AMBULATORY_CARE_PROVIDER_SITE_OTHER): Payer: Medicare Other

## 2014-07-17 ENCOUNTER — Ambulatory Visit: Payer: Medicare Other

## 2014-07-17 DIAGNOSIS — I2699 Other pulmonary embolism without acute cor pulmonale: Secondary | ICD-10-CM

## 2014-07-17 DIAGNOSIS — T81718A Complication of other artery following a procedure, not elsewhere classified, initial encounter: Secondary | ICD-10-CM | POA: Diagnosis not present

## 2014-07-17 LAB — PROTIME-INR
INR: 2.2 ratio — AB (ref 0.8–1.0)
Prothrombin Time: 24 s — ABNORMAL HIGH (ref 9.6–13.1)

## 2014-07-18 ENCOUNTER — Encounter: Payer: Self-pay | Admitting: Family Medicine

## 2014-07-18 DIAGNOSIS — R262 Difficulty in walking, not elsewhere classified: Secondary | ICD-10-CM | POA: Diagnosis not present

## 2014-07-18 DIAGNOSIS — R269 Unspecified abnormalities of gait and mobility: Secondary | ICD-10-CM | POA: Diagnosis not present

## 2014-07-18 DIAGNOSIS — G250 Essential tremor: Secondary | ICD-10-CM | POA: Diagnosis not present

## 2014-07-24 DIAGNOSIS — M955 Acquired deformity of pelvis: Secondary | ICD-10-CM | POA: Diagnosis not present

## 2014-07-24 DIAGNOSIS — M9905 Segmental and somatic dysfunction of pelvic region: Secondary | ICD-10-CM | POA: Diagnosis not present

## 2014-07-24 DIAGNOSIS — M62830 Muscle spasm of back: Secondary | ICD-10-CM | POA: Diagnosis not present

## 2014-07-24 DIAGNOSIS — M9903 Segmental and somatic dysfunction of lumbar region: Secondary | ICD-10-CM | POA: Diagnosis not present

## 2014-08-08 ENCOUNTER — Encounter: Payer: Self-pay | Admitting: Family Medicine

## 2014-08-08 DIAGNOSIS — R262 Difficulty in walking, not elsewhere classified: Secondary | ICD-10-CM | POA: Diagnosis not present

## 2014-08-08 DIAGNOSIS — R269 Unspecified abnormalities of gait and mobility: Secondary | ICD-10-CM | POA: Diagnosis not present

## 2014-08-08 DIAGNOSIS — G250 Essential tremor: Secondary | ICD-10-CM | POA: Diagnosis not present

## 2014-08-11 DIAGNOSIS — M955 Acquired deformity of pelvis: Secondary | ICD-10-CM | POA: Diagnosis not present

## 2014-08-11 DIAGNOSIS — M9905 Segmental and somatic dysfunction of pelvic region: Secondary | ICD-10-CM | POA: Diagnosis not present

## 2014-08-11 DIAGNOSIS — M62830 Muscle spasm of back: Secondary | ICD-10-CM | POA: Diagnosis not present

## 2014-08-11 DIAGNOSIS — M9903 Segmental and somatic dysfunction of lumbar region: Secondary | ICD-10-CM | POA: Diagnosis not present

## 2014-08-14 ENCOUNTER — Other Ambulatory Visit (INDEPENDENT_AMBULATORY_CARE_PROVIDER_SITE_OTHER): Payer: Medicare Other

## 2014-08-14 DIAGNOSIS — M3501 Sicca syndrome with keratoconjunctivitis: Secondary | ICD-10-CM | POA: Diagnosis not present

## 2014-08-14 DIAGNOSIS — Z5181 Encounter for therapeutic drug level monitoring: Secondary | ICD-10-CM | POA: Diagnosis not present

## 2014-08-14 LAB — PROTIME-INR
INR: 2.6 ratio — ABNORMAL HIGH (ref 0.8–1.0)
PROTHROMBIN TIME: 28.5 s — AB (ref 9.6–13.1)

## 2014-08-26 ENCOUNTER — Encounter: Payer: Self-pay | Admitting: Family Medicine

## 2014-09-04 ENCOUNTER — Ambulatory Visit (INDEPENDENT_AMBULATORY_CARE_PROVIDER_SITE_OTHER): Payer: Medicare Other | Admitting: Family Medicine

## 2014-09-04 DIAGNOSIS — Z5181 Encounter for therapeutic drug level monitoring: Secondary | ICD-10-CM

## 2014-09-04 DIAGNOSIS — I82409 Acute embolism and thrombosis of unspecified deep veins of unspecified lower extremity: Secondary | ICD-10-CM

## 2014-09-04 DIAGNOSIS — Z7901 Long term (current) use of anticoagulants: Secondary | ICD-10-CM

## 2014-09-04 LAB — POCT INR: INR: 2.1

## 2014-09-08 ENCOUNTER — Encounter: Payer: Self-pay | Admitting: Family Medicine

## 2014-09-11 ENCOUNTER — Ambulatory Visit: Payer: Medicare Other

## 2014-09-14 DIAGNOSIS — M9903 Segmental and somatic dysfunction of lumbar region: Secondary | ICD-10-CM | POA: Diagnosis not present

## 2014-09-14 DIAGNOSIS — M62830 Muscle spasm of back: Secondary | ICD-10-CM | POA: Diagnosis not present

## 2014-09-14 DIAGNOSIS — M9905 Segmental and somatic dysfunction of pelvic region: Secondary | ICD-10-CM | POA: Diagnosis not present

## 2014-09-14 DIAGNOSIS — M955 Acquired deformity of pelvis: Secondary | ICD-10-CM | POA: Diagnosis not present

## 2014-09-29 ENCOUNTER — Other Ambulatory Visit: Payer: Self-pay | Admitting: Family Medicine

## 2014-10-02 ENCOUNTER — Other Ambulatory Visit (INDEPENDENT_AMBULATORY_CARE_PROVIDER_SITE_OTHER): Payer: Medicare Other

## 2014-10-02 DIAGNOSIS — I82409 Acute embolism and thrombosis of unspecified deep veins of unspecified lower extremity: Secondary | ICD-10-CM | POA: Diagnosis not present

## 2014-10-02 DIAGNOSIS — Z7901 Long term (current) use of anticoagulants: Secondary | ICD-10-CM | POA: Diagnosis not present

## 2014-10-02 DIAGNOSIS — Z5181 Encounter for therapeutic drug level monitoring: Secondary | ICD-10-CM | POA: Diagnosis not present

## 2014-10-02 LAB — POCT INR: INR: 1.9

## 2014-10-04 ENCOUNTER — Other Ambulatory Visit: Payer: Self-pay | Admitting: Family Medicine

## 2014-10-09 DIAGNOSIS — M955 Acquired deformity of pelvis: Secondary | ICD-10-CM | POA: Diagnosis not present

## 2014-10-09 DIAGNOSIS — M9905 Segmental and somatic dysfunction of pelvic region: Secondary | ICD-10-CM | POA: Diagnosis not present

## 2014-10-09 DIAGNOSIS — M62830 Muscle spasm of back: Secondary | ICD-10-CM | POA: Diagnosis not present

## 2014-10-09 DIAGNOSIS — M9903 Segmental and somatic dysfunction of lumbar region: Secondary | ICD-10-CM | POA: Diagnosis not present

## 2014-10-26 DIAGNOSIS — M9905 Segmental and somatic dysfunction of pelvic region: Secondary | ICD-10-CM | POA: Diagnosis not present

## 2014-10-26 DIAGNOSIS — M9903 Segmental and somatic dysfunction of lumbar region: Secondary | ICD-10-CM | POA: Diagnosis not present

## 2014-10-26 DIAGNOSIS — M62830 Muscle spasm of back: Secondary | ICD-10-CM | POA: Diagnosis not present

## 2014-10-26 DIAGNOSIS — M955 Acquired deformity of pelvis: Secondary | ICD-10-CM | POA: Diagnosis not present

## 2014-10-28 ENCOUNTER — Other Ambulatory Visit: Payer: Self-pay | Admitting: Family Medicine

## 2014-10-28 DIAGNOSIS — N183 Chronic kidney disease, stage 3 unspecified: Secondary | ICD-10-CM

## 2014-10-28 DIAGNOSIS — E538 Deficiency of other specified B group vitamins: Secondary | ICD-10-CM

## 2014-10-28 DIAGNOSIS — M810 Age-related osteoporosis without current pathological fracture: Secondary | ICD-10-CM

## 2014-10-28 DIAGNOSIS — Z5181 Encounter for therapeutic drug level monitoring: Secondary | ICD-10-CM

## 2014-10-28 DIAGNOSIS — D539 Nutritional anemia, unspecified: Secondary | ICD-10-CM

## 2014-10-28 DIAGNOSIS — E559 Vitamin D deficiency, unspecified: Secondary | ICD-10-CM

## 2014-10-28 DIAGNOSIS — E785 Hyperlipidemia, unspecified: Secondary | ICD-10-CM

## 2014-10-30 ENCOUNTER — Other Ambulatory Visit (INDEPENDENT_AMBULATORY_CARE_PROVIDER_SITE_OTHER): Payer: Medicare Other

## 2014-10-30 DIAGNOSIS — I82409 Acute embolism and thrombosis of unspecified deep veins of unspecified lower extremity: Secondary | ICD-10-CM | POA: Diagnosis not present

## 2014-10-30 DIAGNOSIS — E785 Hyperlipidemia, unspecified: Secondary | ICD-10-CM | POA: Diagnosis not present

## 2014-10-30 DIAGNOSIS — D539 Nutritional anemia, unspecified: Secondary | ICD-10-CM

## 2014-10-30 DIAGNOSIS — E559 Vitamin D deficiency, unspecified: Secondary | ICD-10-CM

## 2014-10-30 DIAGNOSIS — M810 Age-related osteoporosis without current pathological fracture: Secondary | ICD-10-CM | POA: Diagnosis not present

## 2014-10-30 DIAGNOSIS — N183 Chronic kidney disease, stage 3 unspecified: Secondary | ICD-10-CM

## 2014-10-30 DIAGNOSIS — Z5181 Encounter for therapeutic drug level monitoring: Secondary | ICD-10-CM

## 2014-10-30 DIAGNOSIS — Z7901 Long term (current) use of anticoagulants: Secondary | ICD-10-CM

## 2014-10-30 LAB — VITAMIN D 25 HYDROXY (VIT D DEFICIENCY, FRACTURES): VITD: 46.43 ng/mL (ref 30.00–100.00)

## 2014-10-30 LAB — COMPREHENSIVE METABOLIC PANEL
ALBUMIN: 3.7 g/dL (ref 3.5–5.2)
ALT: 33 U/L (ref 0–53)
AST: 36 U/L (ref 0–37)
Alkaline Phosphatase: 34 U/L — ABNORMAL LOW (ref 39–117)
BILIRUBIN TOTAL: 0.8 mg/dL (ref 0.2–1.2)
BUN: 24 mg/dL — ABNORMAL HIGH (ref 6–23)
CALCIUM: 9.8 mg/dL (ref 8.4–10.5)
CO2: 30 mEq/L (ref 19–32)
CREATININE: 1.46 mg/dL (ref 0.40–1.50)
Chloride: 105 mEq/L (ref 96–112)
GFR: 49.21 mL/min — AB (ref >60.00)
Glucose, Bld: 112 mg/dL — ABNORMAL HIGH (ref 70–99)
Potassium: 5.1 mEq/L (ref 3.5–5.1)
SODIUM: 140 meq/L (ref 135–145)
TOTAL PROTEIN: 6.2 g/dL (ref 6.0–8.3)

## 2014-10-30 LAB — CBC WITH DIFFERENTIAL/PLATELET
BASOS ABS: 0 10*3/uL (ref 0.0–0.1)
Basophils Relative: 0.6 % (ref 0.0–3.0)
EOS PCT: 2.9 % (ref 0.0–5.0)
Eosinophils Absolute: 0.1 10*3/uL (ref 0.0–0.7)
HEMATOCRIT: 36.8 % — AB (ref 39.0–52.0)
HEMOGLOBIN: 12.7 g/dL — AB (ref 13.0–17.0)
LYMPHS ABS: 1.3 10*3/uL (ref 0.7–4.0)
LYMPHS PCT: 26.2 % (ref 12.0–46.0)
MCHC: 34.4 g/dL (ref 30.0–36.0)
MCV: 103.1 fl — ABNORMAL HIGH (ref 78.0–100.0)
Monocytes Absolute: 0.4 10*3/uL (ref 0.1–1.0)
Monocytes Relative: 8.3 % (ref 3.0–12.0)
NEUTROS PCT: 62 % (ref 43.0–77.0)
Neutro Abs: 3.1 10*3/uL (ref 1.4–7.7)
Platelets: 132 10*3/uL — ABNORMAL LOW (ref 150.0–400.0)
RBC: 3.57 Mil/uL — ABNORMAL LOW (ref 4.22–5.81)
RDW: 13.9 % (ref 11.5–15.5)
WBC: 5.1 10*3/uL (ref 4.0–10.5)

## 2014-10-30 LAB — LIPID PANEL
CHOL/HDL RATIO: 5
CHOLESTEROL: 139 mg/dL (ref 0–200)
HDL: 25.5 mg/dL — AB (ref >39.00)
LDL Cholesterol: 77 mg/dL (ref 0–99)
NONHDL: 113.5
TRIGLYCERIDES: 183 mg/dL — AB (ref 0.0–149.0)
VLDL: 36.6 mg/dL (ref 0.0–40.0)

## 2014-10-30 LAB — POCT INR: INR: 1.9

## 2014-10-30 NOTE — Addendum Note (Signed)
Addended by: Ellamae Sia on: 10/30/2014 10:52 AM   Modules accepted: Orders

## 2014-11-01 ENCOUNTER — Other Ambulatory Visit: Payer: Medicare Other

## 2014-11-06 ENCOUNTER — Encounter (HOSPITAL_COMMUNITY): Payer: Self-pay | Admitting: Family Medicine

## 2014-11-06 ENCOUNTER — Inpatient Hospital Stay (HOSPITAL_COMMUNITY)
Admission: EM | Admit: 2014-11-06 | Discharge: 2014-11-09 | DRG: 551 | Disposition: A | Discharge status: Skilled Nursing Facility | Payer: Medicare Other | Attending: Internal Medicine | Admitting: Internal Medicine

## 2014-11-06 ENCOUNTER — Inpatient Hospital Stay (HOSPITAL_COMMUNITY): Payer: Medicare Other

## 2014-11-06 ENCOUNTER — Emergency Department (HOSPITAL_COMMUNITY): Payer: Medicare Other

## 2014-11-06 ENCOUNTER — Encounter: Payer: Self-pay | Admitting: Family Medicine

## 2014-11-06 ENCOUNTER — Ambulatory Visit (INDEPENDENT_AMBULATORY_CARE_PROVIDER_SITE_OTHER): Payer: Medicare Other | Admitting: Family Medicine

## 2014-11-06 VITALS — BP 110/60 | HR 80 | Temp 98.5°F | Ht 65.25 in | Wt 161.8 lb

## 2014-11-06 DIAGNOSIS — H9193 Unspecified hearing loss, bilateral: Secondary | ICD-10-CM | POA: Diagnosis present

## 2014-11-06 DIAGNOSIS — R109 Unspecified abdominal pain: Secondary | ICD-10-CM | POA: Diagnosis not present

## 2014-11-06 DIAGNOSIS — S129XXA Fracture of neck, unspecified, initial encounter: Secondary | ICD-10-CM | POA: Diagnosis not present

## 2014-11-06 DIAGNOSIS — S0083XA Contusion of other part of head, initial encounter: Secondary | ICD-10-CM | POA: Diagnosis not present

## 2014-11-06 DIAGNOSIS — S3992XA Unspecified injury of lower back, initial encounter: Secondary | ICD-10-CM | POA: Diagnosis not present

## 2014-11-06 DIAGNOSIS — M6281 Muscle weakness (generalized): Secondary | ICD-10-CM | POA: Diagnosis not present

## 2014-11-06 DIAGNOSIS — F329 Major depressive disorder, single episode, unspecified: Secondary | ICD-10-CM | POA: Diagnosis present

## 2014-11-06 DIAGNOSIS — R55 Syncope and collapse: Secondary | ICD-10-CM

## 2014-11-06 DIAGNOSIS — S12691A Other nondisplaced fracture of seventh cervical vertebra, initial encounter for closed fracture: Secondary | ICD-10-CM | POA: Diagnosis not present

## 2014-11-06 DIAGNOSIS — S0990XA Unspecified injury of head, initial encounter: Secondary | ICD-10-CM | POA: Diagnosis not present

## 2014-11-06 DIAGNOSIS — N3941 Urge incontinence: Secondary | ICD-10-CM

## 2014-11-06 DIAGNOSIS — F0390 Unspecified dementia without behavioral disturbance: Secondary | ICD-10-CM | POA: Diagnosis present

## 2014-11-06 DIAGNOSIS — I2510 Atherosclerotic heart disease of native coronary artery without angina pectoris: Secondary | ICD-10-CM | POA: Diagnosis present

## 2014-11-06 DIAGNOSIS — W19XXXA Unspecified fall, initial encounter: Secondary | ICD-10-CM

## 2014-11-06 DIAGNOSIS — J189 Pneumonia, unspecified organism: Secondary | ICD-10-CM | POA: Diagnosis not present

## 2014-11-06 DIAGNOSIS — R400 Somnolence: Secondary | ICD-10-CM | POA: Insufficient documentation

## 2014-11-06 DIAGNOSIS — Z79899 Other long term (current) drug therapy: Secondary | ICD-10-CM

## 2014-11-06 DIAGNOSIS — J188 Other pneumonia, unspecified organism: Secondary | ICD-10-CM | POA: Diagnosis not present

## 2014-11-06 DIAGNOSIS — M810 Age-related osteoporosis without current pathological fracture: Secondary | ICD-10-CM | POA: Diagnosis present

## 2014-11-06 DIAGNOSIS — G250 Essential tremor: Secondary | ICD-10-CM | POA: Diagnosis present

## 2014-11-06 DIAGNOSIS — W1830XA Fall on same level, unspecified, initial encounter: Secondary | ICD-10-CM | POA: Diagnosis present

## 2014-11-06 DIAGNOSIS — S12600A Unspecified displaced fracture of seventh cervical vertebra, initial encounter for closed fracture: Principal | ICD-10-CM | POA: Diagnosis present

## 2014-11-06 DIAGNOSIS — S12601D Unspecified nondisplaced fracture of seventh cervical vertebra, subsequent encounter for fracture with routine healing: Secondary | ICD-10-CM | POA: Diagnosis not present

## 2014-11-06 DIAGNOSIS — S3690XA Unspecified injury of unspecified intra-abdominal organ, initial encounter: Secondary | ICD-10-CM | POA: Diagnosis not present

## 2014-11-06 DIAGNOSIS — Y92009 Unspecified place in unspecified non-institutional (private) residence as the place of occurrence of the external cause: Secondary | ICD-10-CM

## 2014-11-06 DIAGNOSIS — Y95 Nosocomial condition: Secondary | ICD-10-CM | POA: Diagnosis present

## 2014-11-06 DIAGNOSIS — E785 Hyperlipidemia, unspecified: Secondary | ICD-10-CM | POA: Diagnosis present

## 2014-11-06 DIAGNOSIS — I129 Hypertensive chronic kidney disease with stage 1 through stage 4 chronic kidney disease, or unspecified chronic kidney disease: Secondary | ICD-10-CM | POA: Diagnosis present

## 2014-11-06 DIAGNOSIS — Z7901 Long term (current) use of anticoagulants: Secondary | ICD-10-CM

## 2014-11-06 DIAGNOSIS — R1084 Generalized abdominal pain: Secondary | ICD-10-CM | POA: Diagnosis not present

## 2014-11-06 DIAGNOSIS — S329XXA Fracture of unspecified parts of lumbosacral spine and pelvis, initial encounter for closed fracture: Secondary | ICD-10-CM | POA: Diagnosis not present

## 2014-11-06 DIAGNOSIS — Z7189 Other specified counseling: Secondary | ICD-10-CM

## 2014-11-06 DIAGNOSIS — Z86711 Personal history of pulmonary embolism: Secondary | ICD-10-CM | POA: Diagnosis not present

## 2014-11-06 DIAGNOSIS — E538 Deficiency of other specified B group vitamins: Secondary | ICD-10-CM | POA: Diagnosis not present

## 2014-11-06 DIAGNOSIS — N183 Chronic kidney disease, stage 3 unspecified: Secondary | ICD-10-CM | POA: Diagnosis present

## 2014-11-06 DIAGNOSIS — Z0000 Encounter for general adult medical examination without abnormal findings: Secondary | ICD-10-CM

## 2014-11-06 DIAGNOSIS — K219 Gastro-esophageal reflux disease without esophagitis: Secondary | ICD-10-CM | POA: Diagnosis present

## 2014-11-06 DIAGNOSIS — I2699 Other pulmonary embolism without acute cor pulmonale: Secondary | ICD-10-CM | POA: Diagnosis not present

## 2014-11-06 DIAGNOSIS — R278 Other lack of coordination: Secondary | ICD-10-CM | POA: Diagnosis not present

## 2014-11-06 DIAGNOSIS — R509 Fever, unspecified: Secondary | ICD-10-CM | POA: Diagnosis not present

## 2014-11-06 DIAGNOSIS — E559 Vitamin D deficiency, unspecified: Secondary | ICD-10-CM | POA: Diagnosis present

## 2014-11-06 DIAGNOSIS — Z9181 History of falling: Secondary | ICD-10-CM | POA: Diagnosis not present

## 2014-11-06 DIAGNOSIS — J90 Pleural effusion, not elsewhere classified: Secondary | ICD-10-CM | POA: Diagnosis not present

## 2014-11-06 DIAGNOSIS — S299XXA Unspecified injury of thorax, initial encounter: Secondary | ICD-10-CM | POA: Diagnosis not present

## 2014-11-06 DIAGNOSIS — R2681 Unsteadiness on feet: Secondary | ICD-10-CM | POA: Diagnosis not present

## 2014-11-06 DIAGNOSIS — Z9089 Acquired absence of other organs: Secondary | ICD-10-CM | POA: Diagnosis not present

## 2014-11-06 DIAGNOSIS — G4710 Hypersomnia, unspecified: Secondary | ICD-10-CM

## 2014-11-06 DIAGNOSIS — Z86010 Personal history of colonic polyps: Secondary | ICD-10-CM

## 2014-11-06 DIAGNOSIS — F32A Depression, unspecified: Secondary | ICD-10-CM

## 2014-11-06 DIAGNOSIS — G629 Polyneuropathy, unspecified: Secondary | ICD-10-CM | POA: Diagnosis not present

## 2014-11-06 HISTORY — DX: Unspecified displaced fracture of seventh cervical vertebra, initial encounter for closed fracture: S12.600A

## 2014-11-06 LAB — URINALYSIS, ROUTINE W REFLEX MICROSCOPIC
Glucose, UA: NEGATIVE mg/dL
Ketones, ur: NEGATIVE mg/dL
LEUKOCYTES UA: NEGATIVE
Nitrite: NEGATIVE
PH: 5.5 (ref 5.0–8.0)
Protein, ur: NEGATIVE mg/dL
Specific Gravity, Urine: 1.019 (ref 1.005–1.030)
UROBILINOGEN UA: 0.2 mg/dL (ref 0.0–1.0)

## 2014-11-06 LAB — COMPREHENSIVE METABOLIC PANEL
ALBUMIN: 3.5 g/dL (ref 3.5–5.2)
ALT: 57 U/L — ABNORMAL HIGH (ref 0–53)
AST: 89 U/L — AB (ref 0–37)
Alkaline Phosphatase: 38 U/L — ABNORMAL LOW (ref 39–117)
Anion gap: 4 — ABNORMAL LOW (ref 5–15)
BILIRUBIN TOTAL: 0.8 mg/dL (ref 0.3–1.2)
BUN: 26 mg/dL — ABNORMAL HIGH (ref 6–23)
CALCIUM: 9.2 mg/dL (ref 8.4–10.5)
CHLORIDE: 107 mmol/L (ref 96–112)
CO2: 27 mmol/L (ref 19–32)
Creatinine, Ser: 1.88 mg/dL — ABNORMAL HIGH (ref 0.50–1.35)
GFR calc Af Amer: 37 mL/min — ABNORMAL LOW (ref >90)
GFR calc non Af Amer: 32 mL/min — ABNORMAL LOW (ref >90)
Glucose, Bld: 185 mg/dL — ABNORMAL HIGH (ref 70–99)
Potassium: 4 mmol/L (ref 3.5–5.1)
Sodium: 138 mmol/L (ref 135–145)
Total Protein: 5.8 g/dL — ABNORMAL LOW (ref 6.0–8.3)

## 2014-11-06 LAB — CBC WITH DIFFERENTIAL/PLATELET
Basophils Absolute: 0 10*3/uL (ref 0.0–0.1)
Basophils Relative: 0 % (ref 0–1)
EOS ABS: 0 10*3/uL (ref 0.0–0.7)
Eosinophils Relative: 0 % (ref 0–5)
HEMATOCRIT: 35.8 % — AB (ref 39.0–52.0)
Hemoglobin: 12.3 g/dL — ABNORMAL LOW (ref 13.0–17.0)
Lymphocytes Relative: 8 % — ABNORMAL LOW (ref 12–46)
Lymphs Abs: 1 10*3/uL (ref 0.7–4.0)
MCH: 36 pg — ABNORMAL HIGH (ref 26.0–34.0)
MCHC: 34.4 g/dL (ref 30.0–36.0)
MCV: 104.7 fL — ABNORMAL HIGH (ref 78.0–100.0)
Monocytes Absolute: 0.5 10*3/uL (ref 0.1–1.0)
Monocytes Relative: 4 % (ref 3–12)
NEUTROS ABS: 11.3 10*3/uL — AB (ref 1.7–7.7)
Neutrophils Relative %: 88 % — ABNORMAL HIGH (ref 43–77)
Platelets: 131 10*3/uL — ABNORMAL LOW (ref 150–400)
RBC: 3.42 MIL/uL — ABNORMAL LOW (ref 4.22–5.81)
RDW: 13.9 % (ref 11.5–15.5)
WBC: 12.8 10*3/uL — AB (ref 4.0–10.5)

## 2014-11-06 LAB — I-STAT TROPONIN, ED: TROPONIN I, POC: 0 ng/mL (ref 0.00–0.08)

## 2014-11-06 LAB — PROTIME-INR
INR: 2.14 — ABNORMAL HIGH (ref 0.00–1.49)
PROTHROMBIN TIME: 24.1 s — AB (ref 11.6–15.2)

## 2014-11-06 LAB — URINE MICROSCOPIC-ADD ON

## 2014-11-06 LAB — MAGNESIUM: MAGNESIUM: 1.8 mg/dL (ref 1.5–2.5)

## 2014-11-06 MED ORDER — FENTANYL CITRATE 0.05 MG/ML IJ SOLN
25.0000 ug | Freq: Once | INTRAMUSCULAR | Status: AC
  Administered 2014-11-07: 25 ug via INTRAVENOUS
  Filled 2014-11-06 (×2): qty 2

## 2014-11-06 MED ORDER — MORPHINE SULFATE 2 MG/ML IJ SOLN
2.0000 mg | INTRAMUSCULAR | Status: DC | PRN
  Administered 2014-11-06 – 2014-11-07 (×2): 4 mg via INTRAVENOUS
  Administered 2014-11-07 (×3): 2 mg via INTRAVENOUS
  Administered 2014-11-08: 4 mg via INTRAVENOUS
  Administered 2014-11-09: 2 mg via INTRAVENOUS
  Filled 2014-11-06: qty 2
  Filled 2014-11-06: qty 1
  Filled 2014-11-06: qty 2
  Filled 2014-11-06 (×3): qty 1
  Filled 2014-11-06: qty 2

## 2014-11-06 MED ORDER — MORPHINE SULFATE 2 MG/ML IJ SOLN
2.0000 mg | Freq: Once | INTRAMUSCULAR | Status: AC
  Administered 2014-11-06: 2 mg via INTRAVENOUS
  Filled 2014-11-06: qty 1

## 2014-11-06 MED ORDER — SODIUM CHLORIDE 0.9 % IJ SOLN
3.0000 mL | Freq: Two times a day (BID) | INTRAMUSCULAR | Status: DC
  Administered 2014-11-06 – 2014-11-08 (×4): 3 mL via INTRAVENOUS

## 2014-11-06 MED ORDER — WARFARIN SODIUM 3 MG PO TABS
3.0000 mg | ORAL_TABLET | Freq: Once | ORAL | Status: AC
  Administered 2014-11-07: 3 mg via ORAL
  Filled 2014-11-06: qty 1

## 2014-11-06 MED ORDER — WARFARIN - PHARMACIST DOSING INPATIENT
Freq: Every day | Status: DC
  Administered 2014-11-08: 18:00:00

## 2014-11-06 NOTE — Assessment & Plan Note (Signed)
Chronic, stable. Continue statin and fibrate. Encouraged stationary exercises.

## 2014-11-06 NOTE — Assessment & Plan Note (Signed)
Pt repeatedly denies significant depression outside of that expected after recent dog's loss. Continue low dose venlafaxine.

## 2014-11-06 NOTE — Assessment & Plan Note (Signed)
Continue low dose oxybutynin.

## 2014-11-06 NOTE — Progress Notes (Signed)
Pre visit review using our clinic review tool, if applicable. No additional management support is needed unless otherwise documented below in the visit note. 

## 2014-11-06 NOTE — Patient Instructions (Addendum)
Good to see you today call us with questions. Pass by Marion's office to schedule CT scan and referral to pulmonology. We will call you with the results.

## 2014-11-06 NOTE — ED Notes (Signed)
Pt unwitnessed fall about 2 stairs and striking head on the concrete. Hematoma to head pt on coumadin., unknown LOC. Pain all over.

## 2014-11-06 NOTE — ED Notes (Signed)
MD Wickline at bedside. 

## 2014-11-06 NOTE — Consult Note (Signed)
Reason for Consult: Fall Referring Physician: Dr. Hale [name] is an 81 y.o. male.  HPI: This gentleman presents status post an unwitnessed fall. This occurred approximately 4-5 hours ago. It was suspected to be syncopal. His wife found him lying on his back on the concrete. There are 2 steps nearby into the house but she is uncertain if he fell from the steps or from the ground. He is awake and alert but has been having memory issues. He is uncertain whether there was loss of consciousness. He currently complains of hurting all over including his head, back, and abdomen. There was no trauma to the abdomen. I have been asked to evaluate him from a general trauma perspective. His only injury that has been found thus far is a mild C7 anterior compression fracture.  Past Medical History  Diagnosis Date  . GERD (gastroesophageal reflux disease)     h/o PUD  . CAD (coronary artery disease)     cath 2000 30% single vessel, normal nuclear stress test 12/03/2010, no evidence ischemia  . CKD (chronic kidney disease) stage 3, GFR 30-59 ml/min     baseline Cr 1.7  . Depression   . Hx pulmonary embolism 08/1991    coumadin  . History of phlebitis   . Allergic rhinitis   . Urge incontinence   . Elevated PSA     previous-normalized (followed by Dr. Jeffie Pollock, rec no repeat unless urinary sxs)  . HLD (hyperlipidemia)     hypertriglyceridemia  . Asthma     remote  . Arthritis   . Blood transfusion 1990's  . Schatzki's ring 02/2012    s/p dilation [name]  . History of fracture of right hip   . Hearing loss 06/2012    eval - rec annual exam  . Essential tremor 09/27/2008  . Prediabetes   . DISH (diffuse idiopathic skeletal hyperostosis) 03/2013    lumbar spine on xray  . DDD (degenerative disc disease) 03/2013    by CT, diffuse multilevel cervical and lumbar spondylosis  . Osteoporosis 11/2013    DEXA T score -2.7  . Macrocytic anemia     stable B12/folate and periph smear 05/2013  .  Sleep apnea     no CPAP- sleep apnea "cleared up 15 years ago"  . Dizziness     multifactorial, s/p PT at [facility] 06/2014 with HEP  . History of pyelonephritis 05/2014    hospitalization with sepsis    Past Surgical History  Procedure Laterality Date  . Tonsillectomy  1965  . Hernia repair  1979    Left  . Cystoscopy  1992    for kidney stones  . Exploratory laparotomy  1996    with incidental appendectomy  . Appendectomy  1996  . Cardiac catheterization  2000    30% one vessel  . Knee arthroscopy  03/24/02    Right (Dr. Mauri Pole)  . Orif femoral neck fracture w/ dhs  08/03/02    Right (Dr. Mauri Pole)  . Ct abd w & pelvis wo cm  07/2001    Scarring of right lung, stable negative o/w  . Ct abd w & pelvis wo cm  11/2000    ? stones, right LL scarring  . V/q scan  06/1999    negative  . [place]oscopy  1998    [place]  . US echocardiography  12/28/2007    Mild aortic valve calcification EF 55%, basically nml  . Carotid u/s  12/28/2007    nml  .  Eeg  11/12/2009    nml  . Mri  11/2009    Head, nml  . Cataract extraction  Jan, March 2011    bilateral  . Colonoscopy  07/2010    5 polyps, adenomatous, rec rpt 3 yrs  . Esophagogastroduodenoscopy  02/2012    dilation of schatzki's ring [name]  . Upper gastrointestinal endoscopy    . Colonoscopy  04/2014    3 polyps, adenomatous, f/u open ended given age [name]    Family History  Problem Relation Age of Onset  . Stroke Mother   . Hypertension Mother   . Cancer Brother     prostate with mets  . Blindness Brother     legally  . Diabetes Brother   . Pulmonary embolism Sister     from shoulder operation  . Alcohol abuse Brother   . Colon cancer Neg Hx   . Esophageal cancer Neg Hx   . Rectal cancer Neg Hx   . Stomach cancer Neg Hx     Social History:  reports that he has never smoked. He has never used smokeless tobacco. He reports that he does not drink alcohol or use illicit drugs.  Allergies:  Allergies  Allergen  Reactions  . Lactose Intolerance (Gi)   . Nizatidine     REACTION: Rash (Axid)  . Penicillins     REACTION: Rash  . Sulfadiazine     REACTION: Hives    Medications: I have reviewed the patient's current medications.  Results for orders placed or performed during the hospital encounter of 11/06/14 (from the past 48 hour(s))  CBC with Differential     Status: Abnormal   Collection Time: 11/06/14  5:50 PM  Result Value Ref Range   WBC 12.8 (H) 4.0 - 10.5 K/uL   RBC 3.42 (L) 4.22 - 5.81 MIL/uL   Hemoglobin 12.3 (L) 13.0 - 17.0 g/dL   HCT 35.8 (L) 39.0 - 52.0 %   MCV 104.7 (H) 78.0 - 100.0 fL   MCH 36.0 (H) 26.0 - 34.0 pg   MCHC 34.4 30.0 - 36.0 g/dL   RDW 13.9 11.5 - 15.5 %   Platelets 131 (L) 150 - 400 K/uL   Neutrophils Relative % 88 (H) 43 - 77 %   Lymphocytes Relative 8 (L) 12 - 46 %   Monocytes Relative 4 3 - 12 %   Eosinophils Relative 0 0 - 5 %   Basophils Relative 0 0 - 1 %   Neutro Abs 11.3 (H) 1.7 - 7.7 K/uL   Lymphs Abs 1.0 0.7 - 4.0 K/uL   Monocytes Absolute 0.5 0.1 - 1.0 K/uL   Eosinophils Absolute 0.0 0.0 - 0.7 K/uL   Basophils Absolute 0.0 0.0 - 0.1 K/uL   WBC Morphology MILD LEFT SHIFT (1-5% METAS, OCC MYELO, OCC BANDS)     Comment: ATYPICAL LYMPHOCYTES TOXIC GRANULATION   Comprehensive metabolic panel     Status: Abnormal   Collection Time: 11/06/14  5:50 PM  Result Value Ref Range   Sodium 138 135 - 145 mmol/L   Potassium 4.0 3.5 - 5.1 mmol/L   Chloride 107 96 - 112 mmol/L   CO2 27 19 - 32 mmol/L   Glucose, Bld 185 (H) 70 - 99 mg/dL   BUN 26 (H) 6 - 23 mg/dL   Creatinine, Ser 1.88 (H) 0.50 - 1.35 mg/dL   Calcium 9.2 8.4 - 10.5 mg/dL   Total Protein 5.8 (L) 6.0 - 8.3 g/dL   Albumin 3.5 3.5 -  5.2 g/dL   AST 89 (H) 0 - 37 U/L   ALT 57 (H) 0 - 53 U/L   Alkaline Phosphatase 38 (L) 39 - 117 U/L   Total Bilirubin 0.8 0.3 - 1.2 mg/dL   GFR calc non Af Amer 32 (L) >90 mL/min   GFR calc Af Amer 37 (L) >90 mL/min    Comment: (NOTE) The eGFR has been  calculated using the CKD EPI equation. This calculation has not been validated in all clinical situations. eGFR's persistently <90 mL/min signify possible Chronic Kidney Disease.    Anion gap 4 (L) 5 - 15  Magnesium     Status: None   Collection Time: 11/06/14  5:50 PM  Result Value Ref Range   Magnesium 1.8 1.5 - 2.5 mg/dL  Protime-INR     Status: Abnormal   Collection Time: 11/06/14  5:50 PM  Result Value Ref Range   Prothrombin Time 24.1 (H) 11.6 - 15.2 seconds   INR 2.14 (H) 0.00 - 1.49  I-Stat Troponin, ED (not at [facility])     Status: None   Collection Time: 11/06/14  6:07 PM  Result Value Ref Range   Troponin i, poc 0.00 0.00 - 0.08 ng/mL   Comment 3            Comment: Due to the release kinetics of cTnI, a negative result within the first hours of the onset of symptoms does not rule out myocardial infarction with certainty. If myocardial infarction is still suspected, repeat the test at appropriate intervals.   Urinalysis, Routine w reflex microscopic     Status: Abnormal   Collection Time: 11/06/14  8:29 PM  Result Value Ref Range   Color, Urine AMBER (A) YELLOW    Comment: BIOCHEMICALS MAY BE AFFECTED BY COLOR   APPearance HAZY (A) CLEAR   Specific Gravity, Urine 1.019 1.005 - 1.030   pH 5.5 5.0 - 8.0   Glucose, UA NEGATIVE NEGATIVE mg/dL   Hgb urine dipstick SMALL (A) NEGATIVE   Bilirubin Urine SMALL (A) NEGATIVE   Ketones, ur NEGATIVE NEGATIVE mg/dL   Protein, ur NEGATIVE NEGATIVE mg/dL   Urobilinogen, UA 0.2 0.0 - 1.0 mg/dL   Nitrite NEGATIVE NEGATIVE   Leukocytes, UA NEGATIVE NEGATIVE  Urine microscopic-add on     Status: Abnormal   Collection Time: 11/06/14  8:29 PM  Result Value Ref Range   Squamous Epithelial / LPF FEW (A) RARE   WBC, UA 0-2 <3 WBC/hpf   RBC / HPF 3-6 <3 RBC/hpf   Bacteria, UA FEW (A) RARE   Casts HYALINE CASTS (A) NEGATIVE   Urine-Other MUCOUS PRESENT     Dg Thoracic Spine 2 View  11/06/2014   CLINICAL DATA:  Unwitnessed fall   EXAM: THORACIC SPINE - 2 VIEW  COMPARISON:  CT chest dated 06/03/2014  FINDINGS: Normal thoracic kyphosis.  No evidence of fracture or dislocation. Vertebral body heights are maintained.  Moderate multilevel degenerative changes.  Visualized lungs are notable for calcified right hilar nodes.  IMPRESSION: No fracture or dislocation is seen.   Electronically Signed   By: Julian Hy M.D.   On: 11/06/2014 19:10   Dg Lumbar Spine Complete  11/06/2014   CLINICAL DATA:  Fall about 2 stairs  EXAM: LUMBAR SPINE - COMPLETE 4+ VIEW  COMPARISON:  06/06/2014  FINDINGS: Five views of the lumbar spine submitted. No acute fracture or subluxation. Multilevel anterior osteophytes are noted. There is diffuse osteopenia. Left lateral bridging osteophytes noted at L1-L2, L2-L3  and L3-L4 level. Mild disc space flattening at L5-S1 level. Alignment and vertebral body heights are preserved.  IMPRESSION: No acute fracture or subluxation. Diffuse osteopenia. Degenerative changes as described above.   Electronically Signed   By: Lahoma Crocker M.D.   On: 11/06/2014 19:12   Ct Head Wo Contrast  11/06/2014   CLINICAL DATA:  Pt unwitnessed fall about 2 stairs and striking head on the concrete. Hematoma to head pt on coumadin., unknown LOC. Pain all over  EXAM: CT HEAD WITHOUT CONTRAST  CT CERVICAL SPINE WITHOUT CONTRAST  TECHNIQUE: Multidetector CT imaging of the head and cervical spine was performed following the standard protocol without intravenous contrast. Multiplanar CT image reconstructions of the cervical spine were also generated.  COMPARISON:  CT head cervical spine 03/21/2013.  FINDINGS: CT HEAD FINDINGS  No intracranial hemorrhage. No parenchymal contusion. No midline shift or mass effect. Basilar cisterns are patent. No skull base fracture. No fluid in the paranasal sinuses or mastoid air cells. Orbits are normal.  Generalized cortical atrophy and periventricular white matter hypodensities not changed from prior.  CT  CERVICAL SPINE FINDINGS  No prevertebral soft tissue swelling. Normal alignment of cervical vertebral bodies.  There is bulky osteophytosis with bridging osteophytes extending from C2 through C7. There is some separation the osteophyte at C7 from the anterior vertebral body but this not changed comparison exam.  There is mild compression superior inferior endplates is C7 (image 28, sagittal series. There is mild widening of the disc at this level. Overall loss of to body height is minimal (10%).  No evidence epidural or paraspinal hematoma.  IMPRESSION: 1. No intracranial trauma. 2. Atrophy and microvascular disease unchanged. 3. Acute fracture of the C7 vertebral body with mild compression of the superior and inferior endplates. 4. Bulky osteophytosis from C to through C7 anteriorly. Findings conveyed toWickline MDon 11/06/2014  at20:09.   Electronically Signed   By: Suzy Bouchard M.D.   On: 11/06/2014 20:09   Ct Cervical Spine Wo Contrast  11/06/2014   CLINICAL DATA:  Pt unwitnessed fall about 2 stairs and striking head on the concrete. Hematoma to head pt on coumadin., unknown LOC. Pain all over  EXAM: CT HEAD WITHOUT CONTRAST  CT CERVICAL SPINE WITHOUT CONTRAST  TECHNIQUE: Multidetector CT imaging of the head and cervical spine was performed following the standard protocol without intravenous contrast. Multiplanar CT image reconstructions of the cervical spine were also generated.  COMPARISON:  CT head cervical spine 03/21/2013.  FINDINGS: CT HEAD FINDINGS  No intracranial hemorrhage. No parenchymal contusion. No midline shift or mass effect. Basilar cisterns are patent. No skull base fracture. No fluid in the paranasal sinuses or mastoid air cells. Orbits are normal.  Generalized cortical atrophy and periventricular white matter hypodensities not changed from prior.  CT CERVICAL SPINE FINDINGS  No prevertebral soft tissue swelling. Normal alignment of cervical vertebral bodies.  There is bulky  osteophytosis with bridging osteophytes extending from C2 through C7. There is some separation the osteophyte at C7 from the anterior vertebral body but this not changed comparison exam.  There is mild compression superior inferior endplates is C7 (image 28, sagittal series. There is mild widening of the disc at this level. Overall loss of to body height is minimal (10%).  No evidence epidural or paraspinal hematoma.  IMPRESSION: 1. No intracranial trauma. 2. Atrophy and microvascular disease unchanged. 3. Acute fracture of the C7 vertebral body with mild compression of the superior and inferior endplates. 4. Bulky osteophytosis from  C to through C7 anteriorly. Findings conveyed toWickline MDon 11/06/2014  at20:09.   Electronically Signed   By: Suzy Bouchard M.D.   On: 11/06/2014 20:09    Review of Systems  All other systems reviewed and are negative.  Blood pressure 126/58, pulse 86, temperature 98.7 F (37.1 C), temperature source Oral, resp. rate 17, SpO2 100 %. Physical Exam  Constitutional: He is oriented to person, place, and time. He appears well-developed and well-nourished. No distress.  HENT:  Head: Normocephalic and atraumatic.  Right Ear: External ear normal.  Left Ear: External ear normal.  Nose: Nose normal.  Mouth/Throat: Oropharynx is clear and moist. No oropharyngeal exudate.  Eyes: Conjunctivae are normal. Pupils are equal, round, and reactive to light. Right eye exhibits no discharge. Left eye exhibits no discharge. No scleral icterus.  Neck: No tracheal deviation present.  C-collar in place There is cervical tenderness along the lower neck  Cardiovascular: Normal rate, regular rhythm, normal heart sounds and intact distal pulses.   No murmur heard. Respiratory: Effort normal and breath sounds normal. No respiratory distress. He has no wheezes.  GI: Soft.  There is mild fullness in the epigastrium with some tenderness and guarding on the upper abdomen. There are no  abrasions or lacerations  Musculoskeletal: Normal range of motion. He exhibits no edema or tenderness.  Neurological: He is alert and oriented to person, place, and time.  Skin: Skin is warm and dry. No erythema.  Psychiatric: His behavior is normal.    Assessment/Plan: Patient status post ground-level fall  I agree that he needs to be admitted to the medical service for syncopal workup. Neurosurgery is being asked to see the patient regarding the C7 spinal injury. He is greater than 4 hours from the fall and has remained hemodynamically stable. Normally, I would be considering a CAT scan of the abdomen and pelvis with IV contrast to evaluate for a solid organ injury. However, he has poor renal function. He has remained hemodynamically stable. I would rather follow him with serial CBCs and physical examination. Should his pain intensify regarding the abdomen or his hemoglobin decreases, I would then reconsider a CAT scan with IV contrast. The trauma team will follow the patient with you.  Nomie Buchberger A 11/06/2014, 8:53 PM

## 2014-11-06 NOTE — Progress Notes (Signed)
BP 110/60 mmHg  Pulse 80  Temp(Src) 98.5 F (36.9 C) (Oral)  Ht 5' 5.25" (1.657 m)  Wt 161 lb 12 oz (73.369 kg)  BMI 26.72 kg/m2   CC: medicare wellness visit  Subjective:    Patient ID: [name], male    DOB: [birth year], 81 y.o.   MRN: [id number]  HPI: [name] is a 81 y.o. male presenting on 11/06/2014 for Annual Exam   Recently lost dog - run over by car while he was walking her. Very upset over this.  Occasional trouble with bowel/bladder. Started wearing depens. Regularly uses oxybutynin.   Persistent dizziness.   Wife remains concerned about his sleeping - 15-16 hours per day. 10-12 hours at night, then naps during the day. Increased sleeping more noticeable over last year. Very easily falls asleep. + mild snoring, but no apneic episodes. Also easily loses focus/concentration. Slowed decision making.   Memory deficits - noticing more trouble with short term memory.   Geriatric Assessment:  Activities of Daily Living:     Bathing- largely independent    Dressing- dependence    Eating- doing better with weighted utensils, but trouble with shaking    Toileting- independent    Transferring- independent    Continence- uses depens Overall Assessment: partially dependent  Instrumental Activities of Daily Living:     Transportation- dependent     Meal/Food Preparation- dependent     Shopping Errands- dependent     Housekeeping/Chores- dependent     Money Management/Finances- dependent     Medication Management- independent     Ability to Use Telephone- dependent     Laundry- dependent  Overall Assessment: Dependent   Mental Status Exam: (value/max value) 19-23/30, failed clock drawing test. Some difficulty with questions 2/2 tremor.  1 fall in last year - lost balance when unloading groceries. Uses 4 pronged cane regularly, feels steady with this. Enjoys spending time on computer. Denies depression, sadness, anhedonia. Hearing - passes at 42  Db.However recent hearing test done at [facility] - failed Vision deferred - recent eye exam 05/2014. Doesn't drive.  Preventative: Prostate exam - once elevated to 7s, then dropped back to 1s. Recommended by Dr. Jeffie [name] to not recheck unless urinary sxs.  COLONOSCOPY Date: 04/2014 3 polyps, adenomatous, f/u open ended given age [name])  Flu shot done  Tetanus 2003, thinks given Tdap by [facility] 2011 Shingles shot 2006 pneumovax 2000, prevnar 2015 Discussed advanced directives - brings copy for chart today - [name] is wife, [name]. Does not want prolonged life support if terminal. Advanced directives updated 06/2012  Married with 2 children Husband of [name]  Retired: was principal  Activity: walks dog 2-3 times daily about 62min, frequent stops  Diet: some water, good fruits/vegetables, fish 1x/wk, no sodas.  Relevant past medical, surgical, family and social history reviewed and updated as indicated. Interim medical history since our last visit reviewed. Allergies and medications reviewed and updated. Current Outpatient Prescriptions on File Prior to Visit  Medication Sig  . albuterol (PROVENTIL HFA;VENTOLIN HFA) 108 (90 BASE) MCG/ACT inhaler Inhale 2 puffs into the lungs every 6 (six) hours as needed for wheezing or shortness of breath.  Marland Kitchen atorvastatin (LIPITOR) 40 MG tablet Take 40 mg by mouth at bedtime.  . Cholecalciferol (VITAMIN D) 2000 UNITS CAPS Take 1 capsule by mouth daily.  . Coenzyme Q10 (COQ-10) 100 MG CAPS Take 1 capsule by mouth at bedtime.   . Cyanocobalamin (B-12 PO) Take 1 tablet by mouth every  other day.  Marland Kitchen dexlansoprazole (DEXILANT) 60 MG capsule Take 60 mg by mouth daily.  . fenofibrate (TRICOR) 145 MG tablet TAKE 1 TABLET DAILY  . fexofenadine (ALLEGRA) 180 MG tablet Take 180 mg by mouth every evening.   . gabapentin (NEURONTIN) 100 MG capsule Take 100 mg by mouth 3 (three) times daily.  . meclizine (ANTIVERT) 25 MG tablet Take 25 mg by mouth 3 (three) times daily  as needed for dizziness.  . Multiple Vitamins-Minerals (MULTIVITAMIN PO) Take by mouth daily.  Marland Kitchen oxybutynin (DITROPAN) 5 MG tablet Take 5 mg by mouth 2 (two) times daily.  . valACYclovir (VALTREX) 1000 MG tablet Take 1,000 mg by mouth daily as needed (cold sores).  . venlafaxine (EFFEXOR) 75 MG tablet Take 75 mg by mouth daily.  Marland Kitchen warfarin (COUMADIN) 3 MG tablet Take 1.5-3 mg by mouth daily. 3mg  daily except 1.5mg  on Tuesdays   No current facility-administered medications on file prior to visit.   Past Medical History  Diagnosis Date  . GERD (gastroesophageal reflux disease)     h/o PUD  . CAD (coronary artery disease)     cath 2000 30% single vessel, normal nuclear stress test 12/03/2010, no evidence ischemia  . CKD (chronic kidney disease) stage 3, GFR 30-59 ml/min     baseline Cr 1.7  . Depression   . Hx pulmonary embolism 08/1991    coumadin  . History of phlebitis   . Allergic rhinitis   . Urge incontinence   . Elevated PSA     previous-normalized (followed by Dr. Jeffie [name], rec no repeat unless urinary sxs)  . HLD (hyperlipidemia)     hypertriglyceridemia  . Asthma     remote  . Arthritis   . Blood transfusion 1990's  . Schatzki's ring 02/2012    s/p dilation [name])  . History of fracture of right hip   . Hearing loss 06/2012    eval - rec annual exam  . Essential tremor 09/27/2008  . Prediabetes   . DISH (diffuse idiopathic skeletal hyperostosis) 03/2013    lumbar spine on xray  . DDD (degenerative disc disease) 03/2013    by CT, diffuse multilevel cervical and lumbar spondylosis  . Osteoporosis 11/2013    DEXA T score -2.7  . Macrocytic anemia     stable B12/folate and periph smear 05/2013  . Sleep apnea     no CPAP- sleep apnea "cleared up 15 years ago"  . Dizziness     multifactorial, s/p PT at [facility] 06/2014 with HEP  . History of pyelonephritis 05/2014    hospitalization with sepsis    Past Surgical History  Procedure Laterality Date  . Tonsillectomy  1965  .  Hernia repair  1979    Left  . Cystoscopy  1992    for kidney stones  . Exploratory laparotomy  1996    with incidental appendectomy  . Appendectomy  1996  . Cardiac catheterization  2000    30% one vessel  . Knee arthroscopy  03/24/02    Right (Dr. Mauri Pole)  . Orif femoral neck fracture w/ dhs  08/03/02    Right (Dr. Mauri Pole)  . Ct abd w & pelvis wo cm  07/2001    Scarring of right lung, stable negative o/w  . Ct abd w & pelvis wo cm  11/2000    ? stones, right LL scarring  . V/q scan  06/1999    negative  . [place]oscopy  1998    [place]  .  US echocardiography  12/28/2007    Mild aortic valve calcification EF 55%, basically nml  . Carotid u/s  12/28/2007    nml  . Eeg  11/12/2009    nml  . Mri  11/2009    Head, nml  . Cataract extraction  Jan, March 2011    bilateral  . Colonoscopy  07/2010    5 polyps, adenomatous, rec rpt 3 yrs  . Esophagogastroduodenoscopy  02/2012    dilation of schatzki's ring [name])  . Upper gastrointestinal endoscopy    . Colonoscopy  04/2014    3 polyps, adenomatous, f/u open ended given age [name])    Review of Systems Per HPI unless specifically indicated above     Objective:    BP 110/60 mmHg  Pulse 80  Temp(Src) 98.5 F (36.9 C) (Oral)  Ht 5' 5.25" (1.657 m)  Wt 161 lb 12 oz (73.369 kg)  BMI 26.72 kg/m2  Wt Readings from Last 3 Encounters:  11/06/14 161 lb 12 oz (73.369 kg)  07/06/14 163 lb 8 oz (74.163 kg)  04/12/14 171 lb (77.565 kg)    Physical Exam  Constitutional: He is oriented to person, place, and time. He appears well-developed and well-nourished. No distress.  HENT:  Head: Normocephalic and atraumatic.  Right Ear: Hearing, tympanic membrane, external ear and ear canal normal.  Left Ear: Hearing, tympanic membrane, external ear and ear canal normal.  Nose: Nose normal.  Mouth/Throat: Uvula is midline, oropharynx is clear and moist and mucous membranes are normal. No oropharyngeal exudate, posterior oropharyngeal  edema or posterior oropharyngeal erythema.  Eyes: Conjunctivae and EOM are normal. Pupils are equal, round, and reactive to light. No scleral icterus.  Neck: Normal range of motion. Neck supple. Carotid bruit is not present. No thyromegaly present.  Cardiovascular: Normal rate, regular rhythm, normal heart sounds and intact distal pulses.   No murmur heard. Pulses:      Radial pulses are 2+ on the right side, and 2+ on the left side.  Pulmonary/Chest: Effort normal and breath sounds normal. No respiratory distress. He has no wheezes. He has no rales.  Abdominal: Soft. Bowel sounds are normal. He exhibits no distension and no mass. There is no tenderness. There is no rebound and no guarding.  Musculoskeletal: Normal range of motion. He exhibits no edema.  Lymphadenopathy:    He has no cervical adenopathy.  Neurological: He is alert and oriented to person, place, and time. Coordination and gait abnormal.  CN grossly intact Unsteady stance and with romberg. Slowed gait with cane. Marked action tremor present. Mild cogwheeling.  Skin: Skin is warm and dry. No rash noted.  Psychiatric: He has a normal mood and affect. His behavior is normal. Judgment and thought content normal.  Nursing note and vitals reviewed.  Results for orders placed or performed in visit on 10/30/14  Lipid panel  Result Value Ref Range   Cholesterol 139 0 - 200 mg/dL   Triglycerides 183.0 (H) 0.0 - 149.0 mg/dL   HDL 25.50 (L) >39.00 mg/dL   VLDL 36.6 0.0 - 40.0 mg/dL   LDL Cholesterol 77 0 - 99 mg/dL   Total CHOL/HDL Ratio 5    NonHDL 113.50   Comprehensive metabolic panel  Result Value Ref Range   Sodium 140 135 - 145 mEq/L   Potassium 5.1 3.5 - 5.1 mEq/L   Chloride 105 96 - 112 mEq/L   CO2 30 19 - 32 mEq/L   Glucose, Bld 112 (H) 70 - 99 mg/dL  BUN 24 (H) 6 - 23 mg/dL   Creatinine, Ser 1.46 0.40 - 1.50 mg/dL   Total Bilirubin 0.8 0.2 - 1.2 mg/dL   Alkaline Phosphatase 34 (L) 39 - 117 U/L   AST 36 0 - 37  U/L   ALT 33 0 - 53 U/L   Total Protein 6.2 6.0 - 8.3 g/dL   Albumin 3.7 3.5 - 5.2 g/dL   Calcium 9.8 8.4 - 10.5 mg/dL   GFR 49.21 (L) >60.00 mL/min  CBC with Differential/Platelet  Result Value Ref Range   WBC 5.1 4.0 - 10.5 K/uL   RBC 3.57 (L) 4.22 - 5.81 Mil/uL   Hemoglobin 12.7 (L) 13.0 - 17.0 g/dL   HCT 36.8 (L) 39.0 - 52.0 %   MCV 103.1 (H) 78.0 - 100.0 fl   MCHC 34.4 30.0 - 36.0 g/dL   RDW 13.9 11.5 - 15.5 %   Platelets 132.0 (L) 150.0 - 400.0 K/uL   Neutrophils Relative % 62.0 43.0 - 77.0 %   Lymphocytes Relative 26.2 12.0 - 46.0 %   Monocytes Relative 8.3 3.0 - 12.0 %   Eosinophils Relative 2.9 0.0 - 5.0 %   Basophils Relative 0.6 0.0 - 3.0 %   Neutro Abs 3.1 1.4 - 7.7 K/uL   Lymphs Abs 1.3 0.7 - 4.0 K/uL   Monocytes Absolute 0.4 0.1 - 1.0 K/uL   Eosinophils Absolute 0.1 0.0 - 0.7 K/uL   Basophils Absolute 0.0 0.0 - 0.1 K/uL  Vit D  25 hydroxy (rtn osteoporosis monitoring)  Result Value Ref Range   VITD 46.43 30.00 - 100.00 ng/mL  POCT INR  Result Value Ref Range   INR 1.9       Assessment & Plan:   Problem List Items Addressed This Visit    Vitamin B12 deficiency    Continue B12 562mcg QOD. Lab Results  Component Value Date   VITAMINB12 >1500* 07/11/2014  persistent macrocytosis.      Osteoporosis    See prior note for details. Continue vit D 2000 IU daily.      Medicare annual wellness visit, subsequent - Primary    I have personally reviewed the Medicare Annual Wellness questionnaire and have noted 1. The patient's medical and social history 2. Their use of alcohol, tobacco or illicit drugs 3. Their current medications and supplements 4. The patient's functional ability including ADL's, fall risks, home safety risks and hearing or visual impairment. 5. Diet and physical activity 6. Evidence for depression or mood disorders The patients weight, height, BMI have been recorded in the chart.  Hearing and vision has been addressed. I have made  referrals, counseling and provided education to the patient based review of the above and I have provided the pt with a written personalized care plan for preventive services. Provider list updated - see scanned questionairre. Reviewed preventative protocols and updated unless pt declined.       INCONTINENCE, URGE    Continue low dose oxybutynin.      Relevant Orders   CT Head Wo Contrast   HLD (hyperlipidemia)    Chronic, stable. Continue statin and fibrate. Encouraged stationary exercises.      History of colonic polyps    Persistent on recent colonoscopy 04/2014 - will discuss f/u in 3-5 yrs.      Essential tremor    S/p eval by neurology ([facility] ~2010). On gabapentin.      Depression    Pt repeatedly denies significant depression outside of that expected after recent  dog's loss. Continue low dose venlafaxine.      Dementia    MMSE today 19-23/30 (0/5 serial 3s, 4/5 WORLD), most difficulty with calculation, recall and language. Marked difficulty as well with clock drawing test but written testing impaired 2/2 severe tremors.  ?alz type vs parkinsonism vs vascular dementia vs other. Regardless, describes imbalance, incontinence, and worsening memory. Last head CT was 2014. Will order head CT today to update this and for further eval of normal pressure hydrocephalus. Discussed possible med (aricept) vs referral back to neurology for further evaluation pending imaging findings.      Relevant Orders   CT Head Wo Contrast   Daytime somnolence    In h/o CPAP that "cleared up". Will refer back to pulm, likely will need updated sleep study. Pt and wife agree with plan.  Will keep in mind his progressive neurological process which we are working up and may be playing significant factor in daytime somnolence.      Relevant Orders   Ambulatory referral to Pulmonology   CKD (chronic kidney disease) stage 3, GFR 30-59 ml/min    Stable, chronic. continue to monitor.      Advanced  care planning/counseling discussion    Discussed advanced directives - brings copy for chart today - HCPOA is wife, [name]. Does not want prolonged life support if terminal. Advanced directives updated 06/2012          Follow up plan: Return in about 3 months (around 02/04/2015), or as needed, for follow up visit.

## 2014-11-06 NOTE — Assessment & Plan Note (Signed)
In h/o CPAP that "cleared up". Will refer back to pulm, likely will need updated sleep study. Pt and wife agree with plan.  Will keep in mind his progressive neurological process which we are working up and may be playing significant factor in daytime somnolence.

## 2014-11-06 NOTE — Assessment & Plan Note (Signed)
Persistent on recent colonoscopy 04/2014 - will discuss f/u in 3-5 yrs.

## 2014-11-06 NOTE — H&P (Signed)
Hospitalist Admission History and Physical  Patient name: [name] Medical record number: [id number] Date of birth: Feb 28, 1931 Age: 81 y.o. Gender: male  Primary Care Provider: Ria Bush, MD  Chief Complaint: unwitnessed fall, ? Syncope, abd pain   History of Present Illness:This is a 81 y.o. 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 Syncope, abd pain. Per the wife, they just came back home from shopping. She was putting back the garbage can by the house. When she came back to the front of the house, she found patient lying on the floor on the cement. Patient was lethargic but arousable. Wife states that she was unaware of any symptoms prior to the fall. Actually was seen by PCP earlier in the day with otherwise stable workup apart from worsening mentation. Wife and patient deny any recent infections. Does report a remote history of falls in the past associated with infection. Does have an essential tremor that's mildly worsening. Usually ambulates without a walker. Patient denies any prior history of stroke or heart attack in the past. Denied any weakness, shortness of breath, dizziness prior to episode. Is unaware what happened. Presented to the ER afebrile, heart rate in the 70s to 90s, blood pressure in the 80s to 120s-mainly in the 120s. Satting 98% on room air. The cell count 12.8, hemoglobin 12.3, creatinine 1.88, INR 2.14, glucose 185. Troponin negative 1. EKG sinus rhythm with atrial ectopic rhythm and incomplete right bundle branch block. Head CT negative for any acute intracranial trauma. Noted acute fracture of C7 vertebral body with mild compression. Bulky osteophytosis through C7 anteriorly. Thoracic spine x-ray within normal limits. Lumbar spine x-rays negative for any acute fracture. ED resident [name] discussed case along all neurosurgery.  Recommends collar placement. Will follow up in the morning. Trauma surgery also consult with recommendation for medical admission. Serial CBCs for abdominal pain given renal insufficiency in the setting of possible IV contrast.  Assessment and Plan: [name] is a 81 y.o. year old male presenting with fall, ? Syncope, abd pain   Active Problems:   Fall   Abdominal pain, generalized   1- Fall/? 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 Advanced care planning/counseling discussion 11/06/2014  . Daytime somnolence 11/06/2014  . Fall 11/06/2014  . Macrocytic anemia   . History of colonic polyps 03/27/2014  . Trigger finger, acquired 01/05/2014  . Trochanteric bursitis of left hip 11/15/2013  .  Diarrhea 11/14/2013  . Osteoporosis 11/06/2013  . Encounter for therapeutic drug monitoring 10/20/2013  . Dementia 06/08/2013  . Vertigo 01/28/2013  . Bilateral hearing loss 06/07/2012  . GERD (gastroesophageal reflux disease)  04/30/2012  . Dysphagia 12/30/2011  . HLD (hyperlipidemia) 12/08/2011  . Depression   . Medicare annual wellness visit, subsequent 06/09/2011  . UNSTEADY GAIT 04/16/2010  . Vitamin B12 deficiency 11/01/2008  . Vitamin D deficiency 11/01/2008  . Essential tremor 09/27/2008  . EMBOLISM & INFARCTION, IATROGENIC PULMONARY 03/09/2007  . HSV 03/08/2007  . Thrombocytopenia 03/08/2007  . CORONARY ARTERY DISEASE 03/08/2007  . CKD (chronic kidney disease) stage 3, GFR 30-59 ml/min 03/08/2007  . INCONTINENCE, URGE 03/08/2007  . RENAL CALCULUS, HX OF 03/08/2007  . THROMBOPHLEBITIS 01/04/2007   Past Medical History: Past Medical History  Diagnosis Date  . GERD (gastroesophageal reflux disease)     h/o PUD  . CAD (coronary artery disease)     cath 2000 30% single vessel, normal nuclear stress test 12/03/2010, no evidence ischemia  . CKD (chronic kidney disease) stage 3, GFR 30-59 ml/min     baseline Cr 1.7  . Depression   . Hx pulmonary embolism 08/1991    coumadin  . History of phlebitis   . Allergic rhinitis   . Urge incontinence   . Elevated PSA     previous-normalized (followed by [name], rec no repeat unless urinary sxs)  . HLD (hyperlipidemia)     hypertriglyceridemia  . Asthma     remote  . Arthritis   . Blood transfusion 1990's  . Schatzki's ring 02/2012    s/p dilation [name])  . History of fracture of right hip   . Hearing loss 06/2012    eval - rec annual exam  . Essential tremor 09/27/2008  . Prediabetes   . DISH (diffuse idiopathic skeletal hyperostosis) 03/2013    lumbar spine on xray  . DDD (degenerative disc disease) 03/2013    by CT, diffuse multilevel cervical and lumbar spondylosis  . Osteoporosis 11/2013    DEXA T score -2.7  . Macrocytic anemia     stable B12/folate and periph smear 05/2013  . Sleep apnea     no CPAP- sleep apnea "cleared up 15 years ago"  . Dizziness     multifactorial, s/p PT at [facility] 06/2014 with HEP  . History of pyelonephritis  05/2014    hospitalization with sepsis    Past Surgical History: Past Surgical History  Procedure Laterality Date  . Tonsillectomy  1965  . Hernia repair  1979    Left  . Cystoscopy  1992    for kidney stones  . Exploratory laparotomy  1996    with incidental appendectomy  . Appendectomy  1996  . Cardiac catheterization  2000    30% one vessel  . Knee arthroscopy  03/24/02    Right (Dr. Mauri Pole)  . Orif femoral neck fracture w/ dhs  08/03/02    Right (Dr. Mauri Pole)  . Ct abd w & pelvis wo cm  07/2001    Scarring of right lung, stable negative o/w  . Ct abd w & pelvis wo cm  11/2000    ? stones, right LL scarring  . V/q scan  06/1999    negative  . [place]oscopy  1998    [place]  . US echocardiography  12/28/2007    Mild aortic valve calcification EF 55%, basically nml  . Carotid u/s  12/28/2007    nml  . Eeg  11/12/2009    nml  . Mri  11/2009    Head, nml  . Cataract extraction  Jan, March 2011    bilateral  . Colonoscopy  07/2010    5 polyps, adenomatous, rec rpt 3 yrs  . Esophagogastroduodenoscopy  02/2012    dilation of schatzki's ring [name])  . Upper gastrointestinal endoscopy    . Colonoscopy  04/2014    3 polyps, adenomatous, f/u open ended given age [name])    Social History: History   Social History  . Marital Status: Married    Spouse Name: N/A  . Number of Children: 2  . Years of Education: N/A   Occupational History  . 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 Smoking status: Never Smoker   . Smokeless tobacco: Never Used  . Alcohol Use: No  . Drug Use: No  . Sexual Activity: Not on file   Other Topics Concern  . None   Social History Narrative   Married with 2 children   Husband of Gianpaolo Mindel    Retired: was principal    Activity: walks dog 2-3 times daily about 35min, frequent stops    Diet: some water, good fruits/vegetables, fish 1x/wk, no sodas.      Advanced directives: Chauncey Reading is wife,  [name]. Does not want prolonged life support if terminal    Family History: Family History  Problem Relation Age of Onset  . Stroke Mother   . Hypertension Mother   . Cancer Brother     prostate with mets  . Blindness Brother     legally  . Diabetes Brother   . Pulmonary embolism Sister     from shoulder operation  . Alcohol abuse Brother   . Colon cancer Neg Hx   . Esophageal cancer Neg Hx   . Rectal cancer Neg Hx   . Stomach cancer Neg Hx     Allergies: Allergies  Allergen Reactions  . Lactose Intolerance (Gi)   . Nizatidine     REACTION: Rash (Axid)  . Penicillins     REACTION: Rash  . Sulfadiazine     REACTION: Hives    Current Facility-Administered Medications  Medication Dose Route Frequency Provider Last Rate Last Dose  . fentaNYL (SUBLIMAZE) injection 25 mcg  25 mcg Intravenous Once Sinda Du, MD      . morphine 2 MG/ML injection 2-4 mg  2-4 mg Intravenous Q3H PRN Shanda Howells, MD      . sodium chloride 0.9 % injection 3 mL  3 mL Intravenous Q12H Shanda Howells, MD       Current Outpatient Prescriptions  Medication Sig Dispense Refill  . albuterol (PROVENTIL HFA;VENTOLIN HFA) 108 (90 BASE) MCG/ACT inhaler Inhale 2 puffs into the lungs every 6 (six) hours as needed for wheezing or shortness of breath.    Marland Kitchen atorvastatin (LIPITOR) 40 MG tablet Take 40 mg by mouth at bedtime.    . Cholecalciferol (VITAMIN D) 2000 UNITS CAPS Take 1 capsule by mouth daily.    . Coenzyme Q10 (COQ-10) 100 MG CAPS Take 1 capsule by mouth at bedtime.     . Cyanocobalamin (B-12 PO) Take 1 tablet by mouth every other day.    Marland Kitchen dexlansoprazole (DEXILANT) 60 MG capsule Take 60 mg by mouth daily.    . fenofibrate (TRICOR) 145 MG tablet TAKE 1 TABLET DAILY 90 tablet 2  . fexofenadine (ALLEGRA) 180 MG tablet Take 180 mg by mouth every evening.     Marland Kitchen  gabapentin (NEURONTIN) 100 MG capsule Take 100 mg by mouth 3 (three) times daily.    . meclizine (ANTIVERT) 25 MG tablet Take 25 mg by mouth 3  (three) times daily as needed for dizziness.    . Multiple Vitamins-Minerals (MULTIVITAMIN PO) Take by mouth daily.    Marland Kitchen oxybutynin (DITROPAN) 5 MG tablet Take 5 mg by mouth 2 (two) times daily.    . valACYclovir (VALTREX) 1000 MG tablet Take 1,000 mg by mouth daily as needed (cold sores).    . venlafaxine (EFFEXOR) 75 MG tablet Take 75 mg by mouth daily.    Marland Kitchen warfarin (COUMADIN) 3 MG tablet Take 1.5-3 mg by mouth daily. 3mg  daily except 1.5mg  on Tuesdays     Review Of Systems: 12 point ROS negative except as noted above in HPI.  Physical Exam: Filed Vitals:   11/06/14 2130  BP: 124/55  Pulse: 90  Temp:   Resp: 22    General: alert, cooperative and fatigued HEENT: PERRLA, extra ocular movement intact and neck collar in place-stable  Heart: S1, S2 normal, no murmur, rub or gallop, regular rate and rhythm Lungs: clear to auscultation, no wheezes or rales and unlabored breathing Abdomen: + bowel sounds, + TTP diffusely  Extremities: extremities normal, atraumatic, no cyanosis or edema Skin:no rashes Neurology: CN, upper extremity exam limited 2/2 pain and collar, otherwise grossly normal exam  Labs and Imaging: Lab Results  Component Value Date/Time   NA 138 11/06/2014 05:50 PM   K 4.0 11/06/2014 05:50 PM   CL 107 11/06/2014 05:50 PM   CO2 27 11/06/2014 05:50 PM   BUN 26* 11/06/2014 05:50 PM   CREATININE 1.88* 11/06/2014 05:50 PM   GLUCOSE 185* 11/06/2014 05:50 PM   Lab Results  Component Value Date   WBC 12.8* 11/06/2014   HGB 12.3* 11/06/2014   HCT 35.8* 11/06/2014   MCV 104.7* 11/06/2014   PLT 131* 11/06/2014   Urinalysis    Component Value Date/Time   COLORURINE AMBER* 11/06/2014 2029   APPEARANCEUR HAZY* 11/06/2014 2029   LABSPEC 1.019 11/06/2014 2029   PHURINE 5.5 11/06/2014 2029   [place] NEGATIVE 11/06/2014 2029   HGBUR SMALL* 11/06/2014 2029   BILIRUBINUR SMALL* 11/06/2014 2029   BILIRUBINUR Negative 08/30/2012 [place] 11/06/2014 2029    PROTEINUR NEGATIVE 11/06/2014 2029   PROTEINUR Negative 08/30/2012 1646   UROBILINOGEN 0.2 11/06/2014 2029   UROBILINOGEN 0.2 08/30/2012 1646   NITRITE NEGATIVE 11/06/2014 2029   NITRITE Negative 08/30/2012 1646   LEUKOCYTESUR NEGATIVE 11/06/2014 2029       Dg Thoracic Spine 2 View  11/06/2014   CLINICAL DATA:  Unwitnessed fall  EXAM: THORACIC SPINE - 2 VIEW  COMPARISON:  CT chest dated 06/03/2014  FINDINGS: Normal thoracic kyphosis.  No evidence of fracture or dislocation. Vertebral body heights are maintained.  Moderate multilevel degenerative changes.  Visualized lungs are notable for calcified right hilar nodes.  IMPRESSION: No fracture or dislocation is seen.   Electronically Signed   By: Julian Hy M.D.   On: 11/06/2014 19:10   Dg Lumbar Spine Complete  11/06/2014   CLINICAL DATA:  Fall about 2 stairs  EXAM: LUMBAR SPINE - COMPLETE 4+ VIEW  COMPARISON:  06/06/2014  FINDINGS: Five views of the lumbar spine submitted. No acute fracture or subluxation. Multilevel anterior osteophytes are noted. There is diffuse osteopenia. Left lateral bridging osteophytes noted at L1-L2, L2-L3 and L3-L4 level. Mild disc space flattening at L5-S1 level. Alignment and vertebral body heights are  preserved.  IMPRESSION: No acute fracture or subluxation. Diffuse osteopenia. Degenerative changes as described above.   Electronically Signed   By: Lahoma Crocker M.D.   On: 11/06/2014 19:12   Ct Head Wo Contrast  11/06/2014   CLINICAL DATA:  Pt unwitnessed fall about 2 stairs and striking head on the concrete. Hematoma to head pt on coumadin., unknown LOC. Pain all over  EXAM: CT HEAD WITHOUT CONTRAST  CT CERVICAL SPINE WITHOUT CONTRAST  TECHNIQUE: Multidetector CT imaging of the head and cervical spine was performed following the standard protocol without intravenous contrast. Multiplanar CT image reconstructions of the cervical spine were also generated.  COMPARISON:  CT head cervical spine 03/21/2013.  FINDINGS:  CT HEAD FINDINGS  No intracranial hemorrhage. No parenchymal contusion. No midline shift or mass effect. Basilar cisterns are patent. No skull base fracture. No fluid in the paranasal sinuses or mastoid air cells. Orbits are normal.  Generalized cortical atrophy and periventricular white matter hypodensities not changed from prior.  CT CERVICAL SPINE FINDINGS  No prevertebral soft tissue swelling. Normal alignment of cervical vertebral bodies.  There is bulky osteophytosis with bridging osteophytes extending from C2 through C7. There is some separation the osteophyte at C7 from the anterior vertebral body but this not changed comparison exam.  There is mild compression superior inferior endplates is C7 (image 28, sagittal series. There is mild widening of the disc at this level. Overall loss of to body height is minimal (10%).  No evidence epidural or paraspinal hematoma.  IMPRESSION: 1. No intracranial trauma. 2. Atrophy and microvascular disease unchanged. 3. Acute fracture of the C7 vertebral body with mild compression of the superior and inferior endplates. 4. Bulky osteophytosis from C to through C7 anteriorly. Findings conveyed toWickline MDon 11/06/2014  at20:09.   Electronically Signed   By: Suzy Bouchard M.D.   On: 11/06/2014 20:09   Ct Cervical Spine Wo Contrast  11/06/2014   CLINICAL DATA:  Pt unwitnessed fall about 2 stairs and striking head on the concrete. Hematoma to head pt on coumadin., unknown LOC. Pain all over  EXAM: CT HEAD WITHOUT CONTRAST  CT CERVICAL SPINE WITHOUT CONTRAST  TECHNIQUE: Multidetector CT imaging of the head and cervical spine was performed following the standard protocol without intravenous contrast. Multiplanar CT image reconstructions of the cervical spine were also generated.  COMPARISON:  CT head cervical spine 03/21/2013.  FINDINGS: CT HEAD FINDINGS  No intracranial hemorrhage. No parenchymal contusion. No midline shift or mass effect. Basilar cisterns are patent. No  skull base fracture. No fluid in the paranasal sinuses or mastoid air cells. Orbits are normal.  Generalized cortical atrophy and periventricular white matter hypodensities not changed from prior.  CT CERVICAL SPINE FINDINGS  No prevertebral soft tissue swelling. Normal alignment of cervical vertebral bodies.  There is bulky osteophytosis with bridging osteophytes extending from C2 through C7. There is some separation the osteophyte at C7 from the anterior vertebral body but this not changed comparison exam.  There is mild compression superior inferior endplates is C7 (image 28, sagittal series. There is mild widening of the disc at this level. Overall loss of to body height is minimal (10%).  No evidence epidural or paraspinal hematoma.  IMPRESSION: 1. No intracranial trauma. 2. Atrophy and microvascular disease unchanged. 3. Acute fracture of the C7 vertebral body with mild compression of the superior and inferior endplates. 4. Bulky osteophytosis from C to through C7 anteriorly. Findings conveyed toWickline MDon 11/06/2014  at20:09.   Electronically Signed  By: Suzy Bouchard M.D.   On: 11/06/2014 20:09           Shanda Howells MD  Pager: [phone]

## 2014-11-06 NOTE — Assessment & Plan Note (Signed)
See prior note for details. Continue vit D 2000 IU daily.

## 2014-11-06 NOTE — ED Notes (Signed)
Pt returned from scans. Monitored by pulse ox, bp cuff, and 5-lead. Pt informed of need for urine sample. Pt and family will contact staff when pt must urinate.

## 2014-11-06 NOTE — Progress Notes (Addendum)
ANTICOAGULATION CONSULT NOTE - Initial Consult  Pharmacy Consult for warfarin Indication: PE  Allergies  Allergen Reactions  . Lactose Intolerance (Gi)   . Nizatidine     REACTION: Rash (Axid)  . Penicillins     REACTION: Rash  . Sulfadiazine     REACTION: Hives    Patient Measurements:   Heparin Dosing Weight:   Vital Signs: Temp: 98.7 F (37.1 C) (02/29 1714) Temp Source: Oral (02/29 1714) BP: 124/55 mmHg (02/29 2130) Pulse Rate: 90 (02/29 2130)  Labs:  Recent Labs  11/06/14 1750  HGB 12.3*  HCT 35.8*  PLT 131*  LABPROT 24.1*  INR 2.14*  CREATININE 1.88*    Estimated Creatinine Clearance: 27.1 mL/min (by C-G formula based on Cr of 1.88).   Medical History: Past Medical History  Diagnosis Date  . GERD (gastroesophageal reflux disease)     h/o PUD  . CAD (coronary artery disease)     cath 2000 30% single vessel, normal nuclear stress test 12/03/2010, no evidence ischemia  . CKD (chronic kidney disease) stage 3, GFR 30-59 ml/min     baseline Cr 1.7  . Depression   . Hx pulmonary embolism 08/1991    coumadin  . History of phlebitis   . Allergic rhinitis   . Urge incontinence   . Elevated PSA     previous-normalized (followed by Dr. Jeffie Pollock, rec no repeat unless urinary sxs)  . HLD (hyperlipidemia)     hypertriglyceridemia  . Asthma     remote  . Arthritis   . Blood transfusion 1990's  . Schatzki's ring 02/2012    s/p dilation [name]  . History of fracture of right hip   . Hearing loss 06/2012    eval - rec annual exam  . Essential tremor 09/27/2008  . Prediabetes   . DISH (diffuse idiopathic skeletal hyperostosis) 03/2013    lumbar spine on xray  . DDD (degenerative disc disease) 03/2013    by CT, diffuse multilevel cervical and lumbar spondylosis  . Osteoporosis 11/2013    DEXA T score -2.7  . Macrocytic anemia     stable B12/folate and periph smear 05/2013  . Sleep apnea     no CPAP- sleep apnea "cleared up 15 years ago"  . Dizziness    multifactorial, s/p PT at [facility] 06/2014 with HEP  . History of pyelonephritis 05/2014    hospitalization with sepsis    Medications:  See EMR   Assessment: Presents s/p unwitnessed fall, found C7 compression fracture. Pt is on warfarin PTA for hx of PE. Current INR 2.1. Last dose was yesterday. PTA warfarin dose: 1.5 mg po qTues, 3 mg po all other days.  Goal of Therapy:  INR 2-3 Monitor platelets by anticoagulation protocol: Yes   Plan:  -Continue home regimen -Warfarin 3 mg po x1 -Daily INR   Hughes Better, PharmD, BCPS Clinical Pharmacist Pager: [phone] 11/06/2014 10:20 PM

## 2014-11-06 NOTE — ED Provider Notes (Signed)
CSN: [id number]     Arrival date & time 11/06/14  1706 History   First MD Initiated Contact with Patient 11/06/14 1715     Chief Complaint  Patient presents with  . Fall     (Consider location/radiation/quality/duration/timing/severity/associated sxs/prior Treatment) HPI Comments: 81 yo M hx of CAD, CKD, remote PE, HTN, HLD, Essential tremor, presents with CC fall.  This was unwitnessed.  Pt's wife states she went to take out trash, and when she returned to house, pt was found on the ground.  Pt does not remember event, unsure if mechanical fall or syncope.  Currently pt c/o posterior head pain, lower neck pain.  Denies any other symptoms at this time.  Pt is on Coumadin.  No other concerns.   Patient is a 81 y.o. male presenting with fall. The history is provided by the patient and the spouse. No language interpreter was used.  Fall This is a new problem. The current episode started today. The problem has been unchanged. Associated symptoms include headaches and neck pain. Pertinent negatives include no abdominal pain, chest pain, coughing, diaphoresis, fatigue, myalgias, nausea, rash, vertigo, visual change, vomiting or weakness. Nothing aggravates the symptoms. He has tried nothing for the symptoms. The treatment provided no relief.    Past Medical History  Diagnosis Date  . GERD (gastroesophageal reflux disease)     h/o PUD  . CAD (coronary artery disease)     cath 2000 30% single vessel, normal nuclear stress test 12/03/2010, no evidence ischemia  . CKD (chronic kidney disease) stage 3, GFR 30-59 ml/min     baseline Cr 1.7  . Depression   . Hx pulmonary embolism 08/1991    coumadin  . History of phlebitis   . Allergic rhinitis   . Urge incontinence   . Elevated PSA     previous-normalized (followed by Dr. Jeffie Pollock, rec no repeat unless urinary sxs)  . HLD (hyperlipidemia)     hypertriglyceridemia  . Asthma     remote  . Arthritis   . Blood transfusion 1990's  . Schatzki's ring  02/2012    s/p dilation [name]  . History of fracture of right hip   . Hearing loss 06/2012    eval - rec annual exam  . Essential tremor 09/27/2008  . Prediabetes   . DISH (diffuse idiopathic skeletal hyperostosis) 03/2013    lumbar spine on xray  . DDD (degenerative disc disease) 03/2013    by CT, diffuse multilevel cervical and lumbar spondylosis  . Osteoporosis 11/2013    DEXA T score -2.7  . Macrocytic anemia     stable B12/folate and periph smear 05/2013  . Sleep apnea     no CPAP- sleep apnea "cleared up 15 years ago"  . Dizziness     multifactorial, s/p PT at [facility] 06/2014 with HEP  . History of pyelonephritis 05/2014    hospitalization with sepsis   Past Surgical History  Procedure Laterality Date  . Tonsillectomy  1965  . Hernia repair  1979    Left  . Cystoscopy  1992    for kidney stones  . Exploratory laparotomy  1996    with incidental appendectomy  . Appendectomy  1996  . Cardiac catheterization  2000    30% one vessel  . Knee arthroscopy  03/24/02    Right (Dr. Mauri Pole)  . Orif femoral neck fracture w/ dhs  08/03/02    Right (Dr. Mauri Pole)  . Ct abd w & pelvis wo cm  07/2001  Scarring of right lung, stable negative o/w  . Ct abd w & pelvis wo cm  11/2000    ? stones, right LL scarring  . V/q scan  06/1999    negative  . [place]oscopy  1998    [place]  . US echocardiography  12/28/2007    Mild aortic valve calcification EF 55%, basically nml  . Carotid u/s  12/28/2007    nml  . Eeg  11/12/2009    nml  . Mri  11/2009    Head, nml  . Cataract extraction  Jan, March 2011    bilateral  . Colonoscopy  07/2010    5 polyps, adenomatous, rec rpt 3 yrs  . Esophagogastroduodenoscopy  02/2012    dilation of schatzki's ring [name]  . Upper gastrointestinal endoscopy    . Colonoscopy  04/2014    3 polyps, adenomatous, f/u open ended given age [name]   Family History  Problem Relation Age of Onset  . Stroke Mother   . Hypertension Mother   . Cancer  Brother     prostate with mets  . Blindness Brother     legally  . Diabetes Brother   . Pulmonary embolism Sister     from shoulder operation  . Alcohol abuse Brother   . Colon cancer Neg Hx   . Esophageal cancer Neg Hx   . Rectal cancer Neg Hx   . Stomach cancer Neg Hx    History  Substance Use Topics  . Smoking status: Never Smoker   . Smokeless tobacco: Never Used  . Alcohol Use: No    Review of Systems  Constitutional: Negative for diaphoresis and fatigue.  Respiratory: Negative for cough and shortness of breath.   Cardiovascular: Negative for chest pain.  Gastrointestinal: Negative for nausea, vomiting and abdominal pain.  Musculoskeletal: Positive for neck pain. Negative for myalgias.  Skin: Negative for rash and wound.  Neurological: Positive for headaches. Negative for dizziness, vertigo, weakness and light-headedness.  All other systems reviewed and are negative.     Allergies  Lactose intolerance (gi); Nizatidine; Penicillins; and Sulfadiazine  Home Medications   Prior to Admission medications   Medication Sig Start Date End Date Taking? Authorizing Provider  albuterol (PROVENTIL HFA;VENTOLIN HFA) 108 (90 BASE) MCG/ACT inhaler Inhale 2 puffs into the lungs every 6 (six) hours as needed for wheezing or shortness of breath.   Yes Historical Provider, MD  atorvastatin (LIPITOR) 40 MG tablet Take 40 mg by mouth at bedtime.   Yes Historical Provider, MD  Cholecalciferol (VITAMIN D) 2000 UNITS CAPS Take 1 capsule by mouth daily.   Yes Historical Provider, MD  Coenzyme Q10 (COQ-10) 100 MG CAPS Take 1 capsule by mouth at bedtime.    Yes Historical Provider, MD  Cyanocobalamin (B-12 PO) Take 1 tablet by mouth every other day.   Yes Historical Provider, MD  dexlansoprazole (DEXILANT) 60 MG capsule Take 60 mg by mouth daily.   Yes Historical Provider, MD  fenofibrate (TRICOR) 145 MG tablet TAKE 1 TABLET DAILY 06/23/14  Yes Ria Bush, MD  fexofenadine (ALLEGRA) 180  MG tablet Take 180 mg by mouth every evening.    Yes Historical Provider, MD  gabapentin (NEURONTIN) 100 MG capsule Take 100 mg by mouth 3 (three) times daily.   Yes Historical Provider, MD  meclizine (ANTIVERT) 25 MG tablet Take 25 mg by mouth 3 (three) times daily as needed for dizziness.   Yes Historical Provider, MD  Multiple Vitamins-Minerals (MULTIVITAMIN PO) Take by mouth  daily.   Yes Historical Provider, MD  oxybutynin (DITROPAN) 5 MG tablet Take 5 mg by mouth 2 (two) times daily.   Yes Historical Provider, MD  valACYclovir (VALTREX) 1000 MG tablet Take 1,000 mg by mouth daily as needed (cold sores).   Yes Historical Provider, MD  venlafaxine (EFFEXOR) 75 MG tablet Take 75 mg by mouth daily.   Yes Historical Provider, MD  warfarin (COUMADIN) 3 MG tablet Take 1.5-3 mg by mouth daily. 3mg  daily except 1.5mg  on Tuesdays   Yes Historical Provider, MD   BP 124/55 mmHg  Pulse 90  Temp(Src) 98.7 F (37.1 C) (Oral)  Resp 22  SpO2 98% Physical Exam  Constitutional: He is oriented to person, place, and time. He appears well-developed and well-nourished.  HENT:  Head: Normocephalic.  Right Ear: External ear normal.  Left Ear: External ear normal.  Mouth/Throat: Oropharynx is clear and moist.  Abrasion to posterior head.    Eyes: Conjunctivae and EOM are normal. Pupils are equal, round, and reactive to light.  Neck: Normal range of motion. Neck supple.  TTP neck, no step off or deformity.   Cardiovascular: Normal rate, regular rhythm, normal heart sounds and intact distal pulses.   Pulmonary/Chest: Effort normal and breath sounds normal. No respiratory distress. He has no wheezes. He has no rales. He exhibits no tenderness.  Abdominal: Soft. Bowel sounds are normal. He exhibits no distension and no mass. There is no tenderness. There is no rebound and no guarding.  Musculoskeletal: Normal range of motion.  Neurological: He is alert and oriented to person, place, and time.  CN II-XII  intact.  No focal motor or sensory deficits on exam.    Skin: Skin is warm and dry.  Nursing note and vitals reviewed.   ED Course  Procedures (including critical care time) Labs Review Labs Reviewed  CBC WITH DIFFERENTIAL/PLATELET - Abnormal; Notable for the following:    WBC 12.8 (*)    RBC 3.42 (*)    Hemoglobin 12.3 (*)    HCT 35.8 (*)    MCV 104.7 (*)    MCH 36.0 (*)    Platelets 131 (*)    Neutrophils Relative % 88 (*)    Lymphocytes Relative 8 (*)    Neutro Abs 11.3 (*)    All other components within normal limits  COMPREHENSIVE METABOLIC PANEL - Abnormal; Notable for the following:    Glucose, Bld 185 (*)    BUN 26 (*)    Creatinine, Ser 1.88 (*)    Total Protein 5.8 (*)    AST 89 (*)    ALT 57 (*)    Alkaline Phosphatase 38 (*)    GFR calc non Af Amer 32 (*)    GFR calc Af Amer 37 (*)    Anion gap 4 (*)    All other components within normal limits  PROTIME-INR - Abnormal; Notable for the following:    Prothrombin Time 24.1 (*)    INR 2.14 (*)    All other components within normal limits  URINALYSIS, ROUTINE W REFLEX MICROSCOPIC - Abnormal; Notable for the following:    Color, Urine AMBER (*)    APPearance HAZY (*)    Hgb urine dipstick SMALL (*)    Bilirubin Urine SMALL (*)    All other components within normal limits  URINE MICROSCOPIC-ADD ON - Abnormal; Notable for the following:    Squamous Epithelial / LPF FEW (*)    Bacteria, UA FEW (*)    Casts HYALINE  CASTS (*)    All other components within normal limits  MAGNESIUM  URINALYSIS, DIPSTICK ONLY  I-STAT TROPOININ, ED    Imaging Review Dg Thoracic Spine 2 View  11/06/2014   CLINICAL DATA:  Unwitnessed fall  EXAM: THORACIC SPINE - 2 VIEW  COMPARISON:  CT chest dated 06/03/2014  FINDINGS: Normal thoracic kyphosis.  No evidence of fracture or dislocation. Vertebral body heights are maintained.  Moderate multilevel degenerative changes.  Visualized lungs are notable for calcified right hilar nodes.   IMPRESSION: No fracture or dislocation is seen.   Electronically Signed   By: Julian Hy M.D.   On: 11/06/2014 19:10   Dg Lumbar Spine Complete  11/06/2014   CLINICAL DATA:  Fall about 2 stairs  EXAM: LUMBAR SPINE - COMPLETE 4+ VIEW  COMPARISON:  06/06/2014  FINDINGS: Five views of the lumbar spine submitted. No acute fracture or subluxation. Multilevel anterior osteophytes are noted. There is diffuse osteopenia. Left lateral bridging osteophytes noted at L1-L2, L2-L3 and L3-L4 level. Mild disc space flattening at L5-S1 level. Alignment and vertebral body heights are preserved.  IMPRESSION: No acute fracture or subluxation. Diffuse osteopenia. Degenerative changes as described above.   Electronically Signed   By: Lahoma Crocker M.D.   On: 11/06/2014 19:12   Ct Head Wo Contrast  11/06/2014   CLINICAL DATA:  Pt unwitnessed fall about 2 stairs and striking head on the concrete. Hematoma to head pt on coumadin., unknown LOC. Pain all over  EXAM: CT HEAD WITHOUT CONTRAST  CT CERVICAL SPINE WITHOUT CONTRAST  TECHNIQUE: Multidetector CT imaging of the head and cervical spine was performed following the standard protocol without intravenous contrast. Multiplanar CT image reconstructions of the cervical spine were also generated.  COMPARISON:  CT head cervical spine 03/21/2013.  FINDINGS: CT HEAD FINDINGS  No intracranial hemorrhage. No parenchymal contusion. No midline shift or mass effect. Basilar cisterns are patent. No skull base fracture. No fluid in the paranasal sinuses or mastoid air cells. Orbits are normal.  Generalized cortical atrophy and periventricular white matter hypodensities not changed from prior.  CT CERVICAL SPINE FINDINGS  No prevertebral soft tissue swelling. Normal alignment of cervical vertebral bodies.  There is bulky osteophytosis with bridging osteophytes extending from C2 through C7. There is some separation the osteophyte at C7 from the anterior vertebral body but this not changed  comparison exam.  There is mild compression superior inferior endplates is C7 (image 28, sagittal series. There is mild widening of the disc at this level. Overall loss of to body height is minimal (10%).  No evidence epidural or paraspinal hematoma.  IMPRESSION: 1. No intracranial trauma. 2. Atrophy and microvascular disease unchanged. 3. Acute fracture of the C7 vertebral body with mild compression of the superior and inferior endplates. 4. Bulky osteophytosis from C to through C7 anteriorly. Findings conveyed toWickline MDon 11/06/2014  at20:09.   Electronically Signed   By: Suzy Bouchard M.D.   On: 11/06/2014 20:09   Ct Cervical Spine Wo Contrast  11/06/2014   CLINICAL DATA:  Pt unwitnessed fall about 2 stairs and striking head on the concrete. Hematoma to head pt on coumadin., unknown LOC. Pain all over  EXAM: CT HEAD WITHOUT CONTRAST  CT CERVICAL SPINE WITHOUT CONTRAST  TECHNIQUE: Multidetector CT imaging of the head and cervical spine was performed following the standard protocol without intravenous contrast. Multiplanar CT image reconstructions of the cervical spine were also generated.  COMPARISON:  CT head cervical spine 03/21/2013.  FINDINGS: CT HEAD  FINDINGS  No intracranial hemorrhage. No parenchymal contusion. No midline shift or mass effect. Basilar cisterns are patent. No skull base fracture. No fluid in the paranasal sinuses or mastoid air cells. Orbits are normal.  Generalized cortical atrophy and periventricular white matter hypodensities not changed from prior.  CT CERVICAL SPINE FINDINGS  No prevertebral soft tissue swelling. Normal alignment of cervical vertebral bodies.  There is bulky osteophytosis with bridging osteophytes extending from C2 through C7. There is some separation the osteophyte at C7 from the anterior vertebral body but this not changed comparison exam.  There is mild compression superior inferior endplates is C7 (image 28, sagittal series. There is mild widening of the  disc at this level. Overall loss of to body height is minimal (10%).  No evidence epidural or paraspinal hematoma.  IMPRESSION: 1. No intracranial trauma. 2. Atrophy and microvascular disease unchanged. 3. Acute fracture of the C7 vertebral body with mild compression of the superior and inferior endplates. 4. Bulky osteophytosis from C to through C7 anteriorly. Findings conveyed toWickline MDon 11/06/2014  at20:09.   Electronically Signed   By: Suzy Bouchard M.D.   On: 11/06/2014 20:09     EKG Interpretation   Date/Time:  Monday November 06 2014 17:46:38 EST Ventricular Rate:  71 PR Interval:  328 QRS Duration: 100 QT Interval:  395 QTC Calculation: 429 R Axis:   -31 Text Interpretation:  Sinus or ectopic atrial rhythm Prolonged PR interval  Incomplete RBBB and LAFB Low voltage, precordial leads Abnormal R-wave  progression, early transition Consider anterior infarct No significant  change since last tracing Confirmed by Christy Gentles  MD, Elenore Rota ([id number]) on  11/06/2014 6:29:01 PM      MDM   Final diagnoses:  Fall  Syncope   81 yo M hx of CAD, CKD, remote PE, HTN, HLD, Essential tremor, presents with CC fall.  Physical exam as above.  VS WNL.    EKG as above, no significant change from prior.   Labs demonstrate mild leukocytosis, mild macrocytic anemia, UA WNL, Troponin WNl, CMp with elevated BUN/Cr (appear only slightly elevated from baseline), elevated AST, ALT minimally.  INR therapeutic 2.14.   CT head, CT cervical spine, XR thoracic and lumbar spine ordered.  Scans demonstrate mild compression fx of C7.    Trauma consulted, recommend serial CBCs and physical exams, and hold off on any further imaging at this time.  [facility]U consulted for cervical spine fx, recommend hard collar, and f/u in clinic.    Medicine consulted for unwitnessed fall, possible syncope in pt with multiple comorbidities.  Will admit for further evaluation.   Sinda Du  Discussed pt with attending Dr.  Christy Gentles.    Sinda Du, MD 11/07/14 [place], MD 11/08/14 [phone]

## 2014-11-06 NOTE — ED Notes (Signed)
Pt's wife sts patient was seen by PCP today where he failed his mini mental status exam.  PCP was recommending head CT.  Pt is on coumadin.

## 2014-11-06 NOTE — Assessment & Plan Note (Addendum)
MMSE today 19-23/30 (0/5 serial 3s, 4/5 WORLD), most difficulty with calculation, recall and language. Marked difficulty as well with clock drawing test but written testing impaired 2/2 severe tremors.  ?alz type vs parkinsonism vs vascular dementia vs other. Regardless, describes imbalance, incontinence, and worsening memory. Last head CT was 2014. Will order head CT today to update this and for further eval of normal pressure hydrocephalus. Discussed possible med (aricept) vs referral back to neurology for further evaluation pending imaging findings.

## 2014-11-06 NOTE — Assessment & Plan Note (Signed)
S/p eval by neurology ([facility] ~2010). On gabapentin.

## 2014-11-06 NOTE — Assessment & Plan Note (Addendum)
Discussed advanced directives - brings copy for chart today - HCPOA is wife, [name]. Does not want prolonged life support if terminal. Advanced directives updated 06/2012

## 2014-11-06 NOTE — Assessment & Plan Note (Signed)
Stable, chronic. continue to monitor.

## 2014-11-06 NOTE — Assessment & Plan Note (Signed)
Continue B12 584mcg QOD. Lab Results  Component Value Date   VITAMINB12 >1500* 07/11/2014  persistent macrocytosis.

## 2014-11-06 NOTE — Assessment & Plan Note (Signed)

## 2014-11-07 ENCOUNTER — Telehealth: Payer: Self-pay

## 2014-11-07 ENCOUNTER — Inpatient Hospital Stay (HOSPITAL_COMMUNITY): Payer: Medicare Other

## 2014-11-07 DIAGNOSIS — T81718A Complication of other artery following a procedure, not elsewhere classified, initial encounter: Secondary | ICD-10-CM

## 2014-11-07 DIAGNOSIS — I2699 Other pulmonary embolism without acute cor pulmonale: Secondary | ICD-10-CM

## 2014-11-07 DIAGNOSIS — R55 Syncope and collapse: Secondary | ICD-10-CM

## 2014-11-07 DIAGNOSIS — F0390 Unspecified dementia without behavioral disturbance: Secondary | ICD-10-CM

## 2014-11-07 LAB — CBC WITH DIFFERENTIAL/PLATELET
BASOS ABS: 0 10*3/uL (ref 0.0–0.1)
BASOS PCT: 0 % (ref 0–1)
BASOS PCT: 0 % (ref 0–1)
Basophils Absolute: 0 10*3/uL (ref 0.0–0.1)
Basophils Absolute: 0 10*3/uL (ref 0.0–0.1)
Basophils Absolute: 0 10*3/uL (ref 0.0–0.1)
Basophils Relative: 0 % (ref 0–1)
Basophils Relative: 0 % (ref 0–1)
EOS PCT: 2 % (ref 0–5)
Eosinophils Absolute: 0 10*3/uL (ref 0.0–0.7)
Eosinophils Absolute: 0 10*3/uL (ref 0.0–0.7)
Eosinophils Absolute: 0.1 10*3/uL (ref 0.0–0.7)
Eosinophils Absolute: 0.2 10*3/uL (ref 0.0–0.7)
Eosinophils Relative: 0 % (ref 0–5)
Eosinophils Relative: 0 % (ref 0–5)
Eosinophils Relative: 2 % (ref 0–5)
HCT: 33.9 % — ABNORMAL LOW (ref 39.0–52.0)
HCT: 33.6 % — ABNORMAL LOW (ref 39.0–52.0)
HCT: 32.7 % — ABNORMAL LOW (ref 39.0–52.0)
HEMATOCRIT: 34 % — AB (ref 39.0–52.0)
HEMOGLOBIN: 11.1 g/dL — AB (ref 13.0–17.0)
Hemoglobin: 11.3 g/dL — ABNORMAL LOW (ref 13.0–17.0)
Hemoglobin: 10.8 g/dL — ABNORMAL LOW (ref 13.0–17.0)
Hemoglobin: 11.1 g/dL — ABNORMAL LOW (ref 13.0–17.0)
LYMPHS PCT: 7 % — AB (ref 12–46)
LYMPHS PCT: 12 % (ref 12–46)
LYMPHS PCT: 14 % (ref 12–46)
Lymphocytes Relative: 11 % — ABNORMAL LOW (ref 12–46)
Lymphs Abs: 0.7 10*3/uL (ref 0.7–4.0)
Lymphs Abs: 1 10*3/uL (ref 0.7–4.0)
Lymphs Abs: 0.9 10*3/uL (ref 0.7–4.0)
Lymphs Abs: 1.1 10*3/uL (ref 0.7–4.0)
MCH: 35.6 pg — ABNORMAL HIGH (ref 26.0–34.0)
MCH: 35 pg — ABNORMAL HIGH (ref 26.0–34.0)
MCH: 35.3 pg — AB (ref 26.0–34.0)
MCH: 35.5 pg — ABNORMAL HIGH (ref 26.0–34.0)
MCHC: 33.3 g/dL (ref 30.0–36.0)
MCHC: 33 g/dL (ref 30.0–36.0)
MCHC: 33 g/dL (ref 30.0–36.0)
MCHC: 32.6 g/dL (ref 30.0–36.0)
MCV: 106.9 fL — ABNORMAL HIGH (ref 78.0–100.0)
MCV: 106 fL — ABNORMAL HIGH (ref 78.0–100.0)
MCV: 106.9 fL — AB (ref 78.0–100.0)
MCV: 108.6 fL — ABNORMAL HIGH (ref 78.0–100.0)
MONO ABS: 0.6 10*3/uL (ref 0.1–1.0)
MONO ABS: 0.6 10*3/uL (ref 0.1–1.0)
Monocytes Absolute: 0.6 10*3/uL (ref 0.1–1.0)
Monocytes Absolute: 0.7 10*3/uL (ref 0.1–1.0)
Monocytes Relative: 6 % (ref 3–12)
Monocytes Relative: 7 % (ref 3–12)
Monocytes Relative: 7 % (ref 3–12)
Monocytes Relative: 8 % (ref 3–12)
NEUTROS ABS: 7.5 10*3/uL (ref 1.7–7.7)
NEUTROS ABS: 6 10*3/uL (ref 1.7–7.7)
NEUTROS PCT: 79 % — AB (ref 43–77)
Neutro Abs: 8.9 10*3/uL — ABNORMAL HIGH (ref 1.7–7.7)
Neutro Abs: 6.3 10*3/uL (ref 1.7–7.7)
Neutrophils Relative %: 87 % — ABNORMAL HIGH (ref 43–77)
Neutrophils Relative %: 81 % — ABNORMAL HIGH (ref 43–77)
Neutrophils Relative %: 76 % (ref 43–77)
PLATELETS: 102 10*3/uL — AB (ref 150–400)
Platelets: 105 10*3/uL — ABNORMAL LOW (ref 150–400)
Platelets: 94 10*3/uL — ABNORMAL LOW (ref 150–400)
Platelets: 93 10*3/uL — ABNORMAL LOW (ref 150–400)
RBC: 3.17 MIL/uL — ABNORMAL LOW (ref 4.22–5.81)
RBC: 3.17 MIL/uL — AB (ref 4.22–5.81)
RBC: 3.06 MIL/uL — AB (ref 4.22–5.81)
RBC: 3.13 MIL/uL — ABNORMAL LOW (ref 4.22–5.81)
RDW: 14.1 % (ref 11.5–15.5)
RDW: 14.1 % (ref 11.5–15.5)
RDW: 14.1 % (ref 11.5–15.5)
RDW: 14.3 % (ref 11.5–15.5)
WBC: 10.2 10*3/uL (ref 4.0–10.5)
WBC: 9.2 10*3/uL (ref 4.0–10.5)
WBC: 7.9 10*3/uL (ref 4.0–10.5)
WBC: 7.9 10*3/uL (ref 4.0–10.5)

## 2014-11-07 LAB — COMPREHENSIVE METABOLIC PANEL
ALT: 54 U/L — AB (ref 0–53)
AST: 84 U/L — ABNORMAL HIGH (ref 0–37)
Albumin: 3.2 g/dL — ABNORMAL LOW (ref 3.5–5.2)
Alkaline Phosphatase: 29 U/L — ABNORMAL LOW (ref 39–117)
Anion gap: 8 (ref 5–15)
BUN: 29 mg/dL — ABNORMAL HIGH (ref 6–23)
CO2: 29 mmol/L (ref 19–32)
Calcium: 9.5 mg/dL (ref 8.4–10.5)
Chloride: 106 mmol/L (ref 96–112)
Creatinine, Ser: 1.79 mg/dL — ABNORMAL HIGH (ref 0.50–1.35)
GFR calc Af Amer: 39 mL/min — ABNORMAL LOW (ref >90)
GFR, EST NON AFRICAN AMERICAN: 34 mL/min — AB (ref >90)
GLUCOSE: 166 mg/dL — AB (ref 70–99)
Potassium: 4.8 mmol/L (ref 3.5–5.1)
SODIUM: 143 mmol/L (ref 135–145)
Total Bilirubin: 1 mg/dL (ref 0.3–1.2)
Total Protein: 5.3 g/dL — ABNORMAL LOW (ref 6.0–8.3)

## 2014-11-07 LAB — GLUCOSE, CAPILLARY: Glucose-Capillary: 142 mg/dL — ABNORMAL HIGH (ref 70–99)

## 2014-11-07 LAB — TROPONIN I
Troponin I: <0.03 ng/mL (ref <0.031)
Troponin I: <0.03 ng/mL (ref <0.031)

## 2014-11-07 LAB — URINALYSIS, DIPSTICK ONLY
BILIRUBIN URINE: NEGATIVE
Glucose, UA: NEGATIVE mg/dL
Ketones, ur: NEGATIVE mg/dL
Leukocytes, UA: NEGATIVE
Nitrite: NEGATIVE
Protein, ur: NEGATIVE mg/dL
Specific Gravity, Urine: 1.02 (ref 1.005–1.030)
Urobilinogen, UA: 0.2 mg/dL (ref 0.0–1.0)
pH: 5.5 (ref 5.0–8.0)

## 2014-11-07 LAB — PROTIME-INR
INR: 2.13 — ABNORMAL HIGH (ref 0.00–1.49)
PROTHROMBIN TIME: 24 s — AB (ref 11.6–15.2)

## 2014-11-07 MED ORDER — ACETAMINOPHEN 325 MG PO TABS
650.0000 mg | ORAL_TABLET | Freq: Four times a day (QID) | ORAL | Status: DC | PRN
  Administered 2014-11-07 – 2014-11-08 (×5): 650 mg via ORAL
  Filled 2014-11-07 (×5): qty 2

## 2014-11-07 MED ORDER — OXYBUTYNIN CHLORIDE 5 MG PO TABS
5.0000 mg | ORAL_TABLET | Freq: Two times a day (BID) | ORAL | Status: DC
  Administered 2014-11-07 – 2014-11-09 (×4): 5 mg via ORAL
  Filled 2014-11-07 (×4): qty 1

## 2014-11-07 MED ORDER — SODIUM CHLORIDE 0.9 % IV SOLN
INTRAVENOUS | Status: DC
  Administered 2014-11-07: 17:00:00 via INTRAVENOUS
  Filled 2014-11-07: qty 1000

## 2014-11-07 MED ORDER — ATORVASTATIN CALCIUM 40 MG PO TABS
40.0000 mg | ORAL_TABLET | Freq: Every day | ORAL | Status: DC
  Administered 2014-11-07 – 2014-11-08 (×2): 40 mg via ORAL
  Filled 2014-11-07 (×2): qty 1

## 2014-11-07 MED ORDER — SODIUM CHLORIDE 0.9 % IV SOLN
INTRAVENOUS | Status: DC
  Administered 2014-11-07 – 2014-11-08 (×2): via INTRAVENOUS

## 2014-11-07 MED ORDER — FENOFIBRATE 160 MG PO TABS
160.0000 mg | ORAL_TABLET | Freq: Every day | ORAL | Status: DC
  Administered 2014-11-07 – 2014-11-09 (×3): 160 mg via ORAL
  Filled 2014-11-07 (×3): qty 1

## 2014-11-07 MED ORDER — VENLAFAXINE HCL 37.5 MG PO TABS
75.0000 mg | ORAL_TABLET | Freq: Every day | ORAL | Status: DC
  Administered 2014-11-07 – 2014-11-09 (×3): 75 mg via ORAL
  Filled 2014-11-07 (×3): qty 2

## 2014-11-07 MED ORDER — WARFARIN SODIUM 1 MG PO TABS
1.5000 mg | ORAL_TABLET | Freq: Once | ORAL | Status: AC
  Administered 2014-11-07: 1.5 mg via ORAL
  Filled 2014-11-07: qty 1

## 2014-11-07 MED ORDER — MECLIZINE HCL 12.5 MG PO TABS: 25.0000 mg | ORAL_TABLET | Freq: Three times a day (TID) | ORAL | Status: DC | PRN

## 2014-11-07 MED ORDER — GABAPENTIN 100 MG PO CAPS
100.0000 mg | ORAL_CAPSULE | Freq: Three times a day (TID) | ORAL | Status: DC
  Administered 2014-11-07 – 2014-11-09 (×6): 100 mg via ORAL
  Filled 2014-11-07 (×6): qty 1

## 2014-11-07 MED ORDER — LORATADINE 10 MG PO TABS
10.0000 mg | ORAL_TABLET | Freq: Every day | ORAL | Status: DC
  Administered 2014-11-07 – 2014-11-09 (×3): 10 mg via ORAL
  Filled 2014-11-07 (×3): qty 1

## 2014-11-07 NOTE — Progress Notes (Signed)
ANTICOAGULATION CONSULT NOTE - Follow Up Consult  Pharmacy Consult for coumadin Indication: hx  pulmonary embolus  Allergies  Allergen Reactions  . Lactose Intolerance (Gi)   . Nizatidine     REACTION: Rash (Axid)  . Penicillins     REACTION: Rash  . Sulfadiazine     REACTION: Hives    Patient Measurements: Height: 5\' 8"  (172.7 cm) Weight: 169 lb 8.5 oz (76.9 kg) IBW/kg (Calculated) : 68.4   Vital Signs: Temp: 98.2 F (36.8 C) (03/01 1026) Temp Source: Oral (03/01 1026) BP: 111/43 mmHg (03/01 1026) Pulse Rate: 75 (03/01 1026)  Labs:  Recent Labs  11/06/14 1750 11/07/14 0454 11/07/14 0949  HGB 12.3* 11.3* 11.1*  HCT 35.8* 33.9* 33.6*  PLT 131* 102* 105*  LABPROT 24.1* 24.0*  --   INR 2.14* 2.13*  --   CREATININE 1.88* 1.79*  --     Estimated Creatinine Clearance: 31.3 mL/min (by C-G formula based on Cr of 1.79).  Assessment: Patent is an 81 y.o M on coumadin PTA for hx PE.  He was admitted on 3/29 s/p unwitnessed fall and found to have C7 compression fracture.  INR remains at goal today with 2.13.  No bleeding documented.   Goal of Therapy:  INR 2-3    Plan:  - coumadin 1.5mg  PO x1 today (home dose) - daily INR - monitor for s/s of bleeding  Minas Bonser P 11/07/2014,11:37 AM

## 2014-11-07 NOTE — Progress Notes (Signed)
Pt unable to lift legs off of bed and is very weak due to pain.  Unable to do orthostatic vital signs at this time. Will retry tomorrow.

## 2014-11-07 NOTE — Telephone Encounter (Signed)
Spoke with patient's wife. She said they were still waiting on the neurosurgeon. He has been in surgery all day and still hasn't been in for consult. She believes that they are going to leave him in a rigid c-collar until he heals based on what the other doctors have mentioned.

## 2014-11-07 NOTE — Consult Note (Signed)
Reason for Consult: C7 compression fracture Referring Physician: Emergency department  [name] is an 81 y.o. male.  HPI: 82 year old male admitted to the hospital following a fall. Mechanism of fall unclear. Patient with significant tremulousness and progressive dementia. No definite history of syncope. Patient has been hemodynamically stable throughout. He has multiple areas of pain including his neck. He has no radiating pain numbness or weakness. He has some mild headache.  Past Medical History  Diagnosis Date  . GERD (gastroesophageal reflux disease)     h/o PUD  . CAD (coronary artery disease)     cath 2000 30% single vessel, normal nuclear stress test 12/03/2010, no evidence ischemia  . CKD (chronic kidney disease) stage 3, GFR 30-59 ml/min     baseline Cr 1.7  . Depression   . Hx pulmonary embolism 08/1991    coumadin  . History of phlebitis   . Allergic rhinitis   . Urge incontinence   . Elevated PSA     previous-normalized (followed by Dr. Jeffie Pollock, rec no repeat unless urinary sxs)  . HLD (hyperlipidemia)     hypertriglyceridemia  . Asthma     remote  . Arthritis   . Blood transfusion 1990's  . Schatzki's ring 02/2012    s/p dilation [name]  . History of fracture of right hip   . Hearing loss 06/2012    eval - rec annual exam  . Essential tremor 09/27/2008  . Prediabetes   . DISH (diffuse idiopathic skeletal hyperostosis) 03/2013    lumbar spine on xray  . DDD (degenerative disc disease) 03/2013    by CT, diffuse multilevel cervical and lumbar spondylosis  . Osteoporosis 11/2013    DEXA T score -2.7  . Macrocytic anemia     stable B12/folate and periph smear 05/2013  . Sleep apnea     no CPAP- sleep apnea "cleared up 15 years ago"  . Dizziness     multifactorial, s/p PT at [facility] 06/2014 with HEP  . History of pyelonephritis 05/2014    hospitalization with sepsis    Past Surgical History  Procedure Laterality Date  . Tonsillectomy  1965  . Hernia repair   1979    Left  . Cystoscopy  1992    for kidney stones  . Exploratory laparotomy  1996    with incidental appendectomy  . Appendectomy  1996  . Cardiac catheterization  2000    30% one vessel  . Knee arthroscopy  03/24/02    Right (Dr. Mauri Pole)  . Orif femoral neck fracture w/ dhs  08/03/02    Right (Dr. Mauri Pole)  . Ct abd w & pelvis wo cm  07/2001    Scarring of right lung, stable negative o/w  . Ct abd w & pelvis wo cm  11/2000    ? stones, right LL scarring  . V/q scan  06/1999    negative  . [place]oscopy  1998    [place]  . US echocardiography  12/28/2007    Mild aortic valve calcification EF 55%, basically nml  . Carotid u/s  12/28/2007    nml  . Eeg  11/12/2009    nml  . Mri  11/2009    Head, nml  . Cataract extraction  Jan, March 2011    bilateral  . Colonoscopy  07/2010    5 polyps, adenomatous, rec rpt 3 yrs  . Esophagogastroduodenoscopy  02/2012    dilation of schatzki's ring [name]  . Upper gastrointestinal endoscopy    .  Colonoscopy  04/2014    3 polyps, adenomatous, f/u open ended given age [name]    Family History  Problem Relation Age of Onset  . Stroke Mother   . Hypertension Mother   . Cancer Brother     prostate with mets  . Blindness Brother     legally  . Diabetes Brother   . Pulmonary embolism Sister     from shoulder operation  . Alcohol abuse Brother   . Colon cancer Neg Hx   . Esophageal cancer Neg Hx   . Rectal cancer Neg Hx   . Stomach cancer Neg Hx     Social History:  reports that he has never smoked. He has never used smokeless tobacco. He reports that he does not drink alcohol or use illicit drugs.  Allergies:  Allergies  Allergen Reactions  . Lactose Intolerance (Gi)   . Nizatidine     REACTION: Rash (Axid)  . Penicillins     REACTION: Rash  . Sulfadiazine     REACTION: Hives    Medications: I have reviewed the patient's current medications.  Results for orders placed or performed during the hospital encounter of  11/06/14 (from the past 48 hour(s))  CBC with Differential     Status: Abnormal   Collection Time: 11/06/14  5:50 PM  Result Value Ref Range   WBC 12.8 (H) 4.0 - 10.5 K/uL   RBC 3.42 (L) 4.22 - 5.81 MIL/uL   Hemoglobin 12.3 (L) 13.0 - 17.0 g/dL   HCT 35.8 (L) 39.0 - 52.0 %   MCV 104.7 (H) 78.0 - 100.0 fL   MCH 36.0 (H) 26.0 - 34.0 pg   MCHC 34.4 30.0 - 36.0 g/dL   RDW 13.9 11.5 - 15.5 %   Platelets 131 (L) 150 - 400 K/uL   Neutrophils Relative % 88 (H) 43 - 77 %   Lymphocytes Relative 8 (L) 12 - 46 %   Monocytes Relative 4 3 - 12 %   Eosinophils Relative 0 0 - 5 %   Basophils Relative 0 0 - 1 %   Neutro Abs 11.3 (H) 1.7 - 7.7 K/uL   Lymphs Abs 1.0 0.7 - 4.0 K/uL   Monocytes Absolute 0.5 0.1 - 1.0 K/uL   Eosinophils Absolute 0.0 0.0 - 0.7 K/uL   Basophils Absolute 0.0 0.0 - 0.1 K/uL   WBC Morphology MILD LEFT SHIFT (1-5% METAS, OCC MYELO, OCC BANDS)     Comment: ATYPICAL LYMPHOCYTES TOXIC GRANULATION   Comprehensive metabolic panel     Status: Abnormal   Collection Time: 11/06/14  5:50 PM  Result Value Ref Range   Sodium 138 135 - 145 mmol/L   Potassium 4.0 3.5 - 5.1 mmol/L   Chloride 107 96 - 112 mmol/L   CO2 27 19 - 32 mmol/L   Glucose, Bld 185 (H) 70 - 99 mg/dL   BUN 26 (H) 6 - 23 mg/dL   Creatinine, Ser 1.88 (H) 0.50 - 1.35 mg/dL   Calcium 9.2 8.4 - 10.5 mg/dL   Total Protein 5.8 (L) 6.0 - 8.3 g/dL   Albumin 3.5 3.5 - 5.2 g/dL   AST 89 (H) 0 - 37 U/L   ALT 57 (H) 0 - 53 U/L   Alkaline Phosphatase 38 (L) 39 - 117 U/L   Total Bilirubin 0.8 0.3 - 1.2 mg/dL   GFR calc non Af Amer 32 (L) >90 mL/min   GFR calc Af Amer 37 (L) >90 mL/min    Comment: (NOTE)  The eGFR has been calculated using the CKD EPI equation. This calculation has not been validated in all clinical situations. eGFR's persistently <90 mL/min signify possible Chronic Kidney Disease.    Anion gap 4 (L) 5 - 15  Magnesium     Status: None   Collection Time: 11/06/14  5:50 PM  Result Value Ref Range    Magnesium 1.8 1.5 - 2.5 mg/dL  Protime-INR     Status: Abnormal   Collection Time: 11/06/14  5:50 PM  Result Value Ref Range   Prothrombin Time 24.1 (H) 11.6 - 15.2 seconds   INR 2.14 (H) 0.00 - 1.49  I-Stat Troponin, ED (not at [facility])     Status: None   Collection Time: 11/06/14  6:07 PM  Result Value Ref Range   Troponin i, poc 0.00 0.00 - 0.08 ng/mL   Comment 3            Comment: Due to the release kinetics of cTnI, a negative result within the first hours of the onset of symptoms does not rule out myocardial infarction with certainty. If myocardial infarction is still suspected, repeat the test at appropriate intervals.   Urinalysis, Routine w reflex microscopic     Status: Abnormal   Collection Time: 11/06/14  8:29 PM  Result Value Ref Range   Color, Urine AMBER (A) YELLOW    Comment: BIOCHEMICALS MAY BE AFFECTED BY COLOR   APPearance HAZY (A) CLEAR   Specific Gravity, Urine 1.019 1.005 - 1.030   pH 5.5 5.0 - 8.0   Glucose, UA NEGATIVE NEGATIVE mg/dL   Hgb urine dipstick SMALL (A) NEGATIVE   Bilirubin Urine SMALL (A) NEGATIVE   Ketones, ur NEGATIVE NEGATIVE mg/dL   Protein, ur NEGATIVE NEGATIVE mg/dL   Urobilinogen, UA 0.2 0.0 - 1.0 mg/dL   Nitrite NEGATIVE NEGATIVE   Leukocytes, UA NEGATIVE NEGATIVE  Urine microscopic-add on     Status: Abnormal   Collection Time: 11/06/14  8:29 PM  Result Value Ref Range   Squamous Epithelial / LPF FEW (A) RARE   WBC, UA 0-2 <3 WBC/hpf   RBC / HPF 3-6 <3 RBC/hpf   Bacteria, UA FEW (A) RARE   Casts HYALINE CASTS (A) NEGATIVE   Urine-Other MUCOUS PRESENT   Comprehensive metabolic panel     Status: Abnormal   Collection Time: 11/07/14  4:54 AM  Result Value Ref Range   Sodium 143 135 - 145 mmol/L   Potassium 4.8 3.5 - 5.1 mmol/L   Chloride 106 96 - 112 mmol/L   CO2 29 19 - 32 mmol/L   Glucose, Bld 166 (H) 70 - 99 mg/dL   BUN 29 (H) 6 - 23 mg/dL   Creatinine, Ser 1.79 (H) 0.50 - 1.35 mg/dL   Calcium 9.5 8.4 - 10.5 mg/dL   Total  Protein 5.3 (L) 6.0 - 8.3 g/dL   Albumin 3.2 (L) 3.5 - 5.2 g/dL   AST 84 (H) 0 - 37 U/L   ALT 54 (H) 0 - 53 U/L   Alkaline Phosphatase 29 (L) 39 - 117 U/L   Total Bilirubin 1.0 0.3 - 1.2 mg/dL   GFR calc non Af Amer 34 (L) >90 mL/min   GFR calc Af Amer 39 (L) >90 mL/min    Comment: (NOTE) The eGFR has been calculated using the CKD EPI equation. This calculation has not been validated in all clinical situations. eGFR's persistently <90 mL/min signify possible Chronic Kidney Disease.    Anion gap 8 5 - 15  CBC WITH DIFFERENTIAL     Status: Abnormal   Collection Time: 11/07/14  4:54 AM  Result Value Ref Range   WBC 10.2 4.0 - 10.5 K/uL   RBC 3.17 (L) 4.22 - 5.81 MIL/uL   Hemoglobin 11.3 (L) 13.0 - 17.0 g/dL   HCT 33.9 (L) 39.0 - 52.0 %   MCV 106.9 (H) 78.0 - 100.0 fL   MCH 35.6 (H) 26.0 - 34.0 pg   MCHC 33.3 30.0 - 36.0 g/dL   RDW 14.1 11.5 - 15.5 %   Platelets 102 (L) 150 - 400 K/uL    Comment: PLATELET COUNT CONFIRMED BY SMEAR REPEATED TO VERIFY    Neutrophils Relative % 87 (H) 43 - 77 %   Neutro Abs 8.9 (H) 1.7 - 7.7 K/uL   Lymphocytes Relative 7 (L) 12 - 46 %   Lymphs Abs 0.7 0.7 - 4.0 K/uL   Monocytes Relative 6 3 - 12 %   Monocytes Absolute 0.6 0.1 - 1.0 K/uL   Eosinophils Relative 0 0 - 5 %   Eosinophils Absolute 0.0 0.0 - 0.7 K/uL   Basophils Relative 0 0 - 1 %   Basophils Absolute 0.0 0.0 - 0.1 K/uL  Protime-INR     Status: Abnormal   Collection Time: 11/07/14  4:54 AM  Result Value Ref Range   Prothrombin Time 24.0 (H) 11.6 - 15.2 seconds   INR 2.13 (H) 0.00 - 1.49  Glucose, capillary     Status: Abnormal   Collection Time: 11/07/14  6:59 AM  Result Value Ref Range   Glucose-Capillary 142 (H) 70 - 99 mg/dL   Comment 1 Notify RN    Comment 2 Documented in Char   CBC WITH DIFFERENTIAL     Status: Abnormal   Collection Time: 11/07/14  9:49 AM  Result Value Ref Range   WBC 9.2 4.0 - 10.5 K/uL   RBC 3.17 (L) 4.22 - 5.81 MIL/uL   Hemoglobin 11.1 (L) 13.0 -  17.0 g/dL   HCT 33.6 (L) 39.0 - 52.0 %   MCV 106.0 (H) 78.0 - 100.0 fL   MCH 35.0 (H) 26.0 - 34.0 pg   MCHC 33.0 30.0 - 36.0 g/dL   RDW 14.1 11.5 - 15.5 %   Platelets 105 (L) 150 - 400 K/uL    Comment: CONSISTENT WITH PREVIOUS RESULT   Neutrophils Relative % 81 (H) 43 - 77 %   Neutro Abs 7.5 1.7 - 7.7 K/uL   Lymphocytes Relative 11 (L) 12 - 46 %   Lymphs Abs 1.0 0.7 - 4.0 K/uL   Monocytes Relative 7 3 - 12 %   Monocytes Absolute 0.7 0.1 - 1.0 K/uL   Eosinophils Relative 0 0 - 5 %   Eosinophils Absolute 0.0 0.0 - 0.7 K/uL   Basophils Relative 0 0 - 1 %   Basophils Absolute 0.0 0.0 - 0.1 K/uL  Urinalysis, dipstick only     Status: Abnormal   Collection Time: 11/07/14  2:23 PM  Result Value Ref Range   Specific Gravity, Urine 1.020 1.005 - 1.030   pH 5.5 5.0 - 8.0   Glucose, UA NEGATIVE NEGATIVE mg/dL   Hgb urine dipstick SMALL (A) NEGATIVE   Bilirubin Urine NEGATIVE NEGATIVE   Ketones, ur NEGATIVE NEGATIVE mg/dL   Protein, ur NEGATIVE NEGATIVE mg/dL   Urobilinogen, UA 0.2 0.0 - 1.0 mg/dL   Nitrite NEGATIVE NEGATIVE   Leukocytes, UA NEGATIVE NEGATIVE  CBC WITH DIFFERENTIAL     Status: Abnormal  Collection Time: 11/07/14  3:59 PM  Result Value Ref Range   WBC 7.9 4.0 - 10.5 K/uL   RBC 3.06 (L) 4.22 - 5.81 MIL/uL   Hemoglobin 10.8 (L) 13.0 - 17.0 g/dL   HCT 32.7 (L) 39.0 - 52.0 %   MCV 106.9 (H) 78.0 - 100.0 fL   MCH 35.3 (H) 26.0 - 34.0 pg   MCHC 33.0 30.0 - 36.0 g/dL   RDW 14.1 11.5 - 15.5 %   Platelets 94 (L) 150 - 400 K/uL    Comment: REPEATED TO VERIFY CONSISTENT WITH PREVIOUS RESULT    Neutrophils Relative % 79 (H) 43 - 77 %   Neutro Abs 6.3 1.7 - 7.7 K/uL   Lymphocytes Relative 12 12 - 46 %   Lymphs Abs 0.9 0.7 - 4.0 K/uL   Monocytes Relative 7 3 - 12 %   Monocytes Absolute 0.6 0.1 - 1.0 K/uL   Eosinophils Relative 2 0 - 5 %   Eosinophils Absolute 0.1 0.0 - 0.7 K/uL   Basophils Relative 0 0 - 1 %   Basophils Absolute 0.0 0.0 - 0.1 K/uL  Troponin I (q 6hr x  3)     Status: None   Collection Time: 11/07/14  4:00 PM  Result Value Ref Range   Troponin I <0.03 <0.031 ng/mL    Comment:        NO INDICATION OF MYOCARDIAL INJURY.     X-ray Chest Pa And Lateral  11/06/2014   CLINICAL DATA:  Syncope, fall yesterday, back pain  EXAM: CHEST  2 VIEW  COMPARISON:  06/02/2014  FINDINGS: Cardiomediastinal silhouette is stable. No acute infiltrate or pleural effusion. No pulmonary edema. Mild degenerative changes thoracic spine.  IMPRESSION: No active cardiopulmonary disease.   Electronically Signed   By: Lahoma Crocker M.D.   On: 11/06/2014 22:42   Dg Thoracic Spine 2 View  11/06/2014   CLINICAL DATA:  Unwitnessed fall  EXAM: THORACIC SPINE - 2 VIEW  COMPARISON:  CT chest dated 06/03/2014  FINDINGS: Normal thoracic kyphosis.  No evidence of fracture or dislocation. Vertebral body heights are maintained.  Moderate multilevel degenerative changes.  Visualized lungs are notable for calcified right hilar nodes.  IMPRESSION: No fracture or dislocation is seen.   Electronically Signed   By: Julian Hy M.D.   On: 11/06/2014 19:10   Dg Lumbar Spine Complete  11/06/2014   CLINICAL DATA:  Fall about 2 stairs  EXAM: LUMBAR SPINE - COMPLETE 4+ VIEW  COMPARISON:  06/06/2014  FINDINGS: Five views of the lumbar spine submitted. No acute fracture or subluxation. Multilevel anterior osteophytes are noted. There is diffuse osteopenia. Left lateral bridging osteophytes noted at L1-L2, L2-L3 and L3-L4 level. Mild disc space flattening at L5-S1 level. Alignment and vertebral body heights are preserved.  IMPRESSION: No acute fracture or subluxation. Diffuse osteopenia. Degenerative changes as described above.   Electronically Signed   By: Lahoma Crocker M.D.   On: 11/06/2014 19:12   Ct Head Wo Contrast  11/06/2014   CLINICAL DATA:  Pt unwitnessed fall about 2 stairs and striking head on the concrete. Hematoma to head pt on coumadin., unknown LOC. Pain all over  EXAM: CT HEAD WITHOUT  CONTRAST  CT CERVICAL SPINE WITHOUT CONTRAST  TECHNIQUE: Multidetector CT imaging of the head and cervical spine was performed following the standard protocol without intravenous contrast. Multiplanar CT image reconstructions of the cervical spine were also generated.  COMPARISON:  CT head cervical spine 03/21/2013.  FINDINGS: CT  HEAD FINDINGS  No intracranial hemorrhage. No parenchymal contusion. No midline shift or mass effect. Basilar cisterns are patent. No skull base fracture. No fluid in the paranasal sinuses or mastoid air cells. Orbits are normal.  Generalized cortical atrophy and periventricular white matter hypodensities not changed from prior.  CT CERVICAL SPINE FINDINGS  No prevertebral soft tissue swelling. Normal alignment of cervical vertebral bodies.  There is bulky osteophytosis with bridging osteophytes extending from C2 through C7. There is some separation the osteophyte at C7 from the anterior vertebral body but this not changed comparison exam.  There is mild compression superior inferior endplates is C7 (image 28, sagittal series. There is mild widening of the disc at this level. Overall loss of to body height is minimal (10%).  No evidence epidural or paraspinal hematoma.  IMPRESSION: 1. No intracranial trauma. 2. Atrophy and microvascular disease unchanged. 3. Acute fracture of the C7 vertebral body with mild compression of the superior and inferior endplates. 4. Bulky osteophytosis from C to through C7 anteriorly. Findings conveyed toWickline MDon 11/06/2014  at20:09.   Electronically Signed   By: Suzy Bouchard M.D.   On: 11/06/2014 20:09   Ct Cervical Spine Wo Contrast  11/06/2014   CLINICAL DATA:  Pt unwitnessed fall about 2 stairs and striking head on the concrete. Hematoma to head pt on coumadin., unknown LOC. Pain all over  EXAM: CT HEAD WITHOUT CONTRAST  CT CERVICAL SPINE WITHOUT CONTRAST  TECHNIQUE: Multidetector CT imaging of the head and cervical spine was performed following  the standard protocol without intravenous contrast. Multiplanar CT image reconstructions of the cervical spine were also generated.  COMPARISON:  CT head cervical spine 03/21/2013.  FINDINGS: CT HEAD FINDINGS  No intracranial hemorrhage. No parenchymal contusion. No midline shift or mass effect. Basilar cisterns are patent. No skull base fracture. No fluid in the paranasal sinuses or mastoid air cells. Orbits are normal.  Generalized cortical atrophy and periventricular white matter hypodensities not changed from prior.  CT CERVICAL SPINE FINDINGS  No prevertebral soft tissue swelling. Normal alignment of cervical vertebral bodies.  There is bulky osteophytosis with bridging osteophytes extending from C2 through C7. There is some separation the osteophyte at C7 from the anterior vertebral body but this not changed comparison exam.  There is mild compression superior inferior endplates is C7 (image 28, sagittal series. There is mild widening of the disc at this level. Overall loss of to body height is minimal (10%).  No evidence epidural or paraspinal hematoma.  IMPRESSION: 1. No intracranial trauma. 2. Atrophy and microvascular disease unchanged. 3. Acute fracture of the C7 vertebral body with mild compression of the superior and inferior endplates. 4. Bulky osteophytosis from C to through C7 anteriorly. Findings conveyed toWickline MDon 11/06/2014  at20:09.   Electronically Signed   By: Suzy Bouchard M.D.   On: 11/06/2014 20:09   Mri Brain Without Contrast  11/07/2014   CLINICAL DATA:  Initial evaluation for acute trauma, question syncope.  EXAM: MRI HEAD WITHOUT CONTRAST  TECHNIQUE: Multiplanar, multiecho pulse sequences of the brain and surrounding structures were obtained without intravenous contrast.  COMPARISON:  Prior CT from earlier the same day.  FINDINGS: Diffuse prominence of the CSF containing spaces is compatible with generalized cerebral atrophy. Patchy and confluent T2/FLAIR hyperintensity within  the periventricular white matter most consistent with chronic small vessel ischemic disease, fairly mild for patient age. Small vessel type changes present within the central pons as well.  No abnormal foci of restricted diffusion  to suggest acute intracranial infarct. Gray-white matter differentiation maintained. Normal intravascular flow voids are present.  No mass lesion or midline shift. No mass effect. Ventricular prominence related global parenchymal volume loss present without hydrocephalus. No extra-axial fluid collection. No acute or chronic intracranial hemorrhage.  Craniocervical junction within normal limits. Pituitary gland unremarkable.  No acute abnormality seen about the orbits.  Mild mucoperiosteal thickening present within the ethmoidal air cells. No air-fluid levels within the paranasal sinuses to suggest active infection. No mastoid effusion.  Bone marrow signal intensity normal.  IMPRESSION: 1. No acute intracranial infarct or other abnormality identified. 2. Atrophy with mild chronic small vessel ischemic disease.   Electronically Signed   By: Jeannine Boga M.D.   On: 11/07/2014 00:55    Review of systems not obtained due to patient factors. Blood pressure 124/62, pulse 81, temperature 98.6 F (37 C), temperature source Oral, resp. rate 18, height _0  (1.727 m), weight 76.9 kg (169 lb 8.5 oz), SpO2 94 %. The patient is awake and alert. He is pleasant and cooperative. His speech is reasonably fluent. He is mildly confused. Short and long-term recall is impaired. Cranial nerve function is intact. Motor examination of the extremities appears normal albeit limited somewhat by his essential tremor. No evidence of sensory abnormality. Cervical spine minimally tender without bony abnormality.  Assessment/Plan: Very mild C7 compression fracture. Treatment fracture in collar [place] and follow up with me in 2 weeks. I will obtain x-rays at that time.    Charnetta Wulff A 11/07/2014, 6:37 PM

## 2014-11-07 NOTE — Progress Notes (Signed)
Orthopedic Tech Progress Note Patient Details:  [name] November 30, 1930 [id number]  Patient ID: [name], male   DOB: [birth year], 81 y.o.   MRN: [id number] RN stated that pt already has the collar  [name] 11/07/2014, 10:08 AM

## 2014-11-07 NOTE — Evaluation (Signed)
Clinical/Bedside Swallow Evaluation Patient Details  Name: [name] MRN: [id number] Date of Birth: 12-26-1930  Today's Date: 11/07/2014 Time: SLP Start Time (ACUTE ONLY): 3295 SLP Stop Time (ACUTE ONLY): 1502 SLP Time Calculation (min) (ACUTE ONLY): 19 min  Past Medical History:  Past Medical History  Diagnosis Date  . GERD (gastroesophageal reflux disease)     h/o PUD  . CAD (coronary artery disease)     cath 2000 30% single vessel, normal nuclear stress test 12/03/2010, no evidence ischemia  . CKD (chronic kidney disease) stage 3, GFR 30-59 ml/min     baseline Cr 1.7  . Depression   . Hx pulmonary embolism 08/1991    coumadin  . History of phlebitis   . Allergic rhinitis   . Urge incontinence   . Elevated PSA     previous-normalized (followed by Dr. Jeffie Pollock, rec no repeat unless urinary sxs)  . HLD (hyperlipidemia)     hypertriglyceridemia  . Asthma     remote  . Arthritis   . Blood transfusion 1990's  . Schatzki's ring 02/2012    s/p dilation [name]  . History of fracture of right hip   . Hearing loss 06/2012    eval - rec annual exam  . Essential tremor 09/27/2008  . Prediabetes   . DISH (diffuse idiopathic skeletal hyperostosis) 03/2013    lumbar spine on xray  . DDD (degenerative disc disease) 03/2013    by CT, diffuse multilevel cervical and lumbar spondylosis  . Osteoporosis 11/2013    DEXA T score -2.7  . Macrocytic anemia     stable B12/folate and periph smear 05/2013  . Sleep apnea     no CPAP- sleep apnea "cleared up 15 years ago"  . Dizziness     multifactorial, s/p PT at [facility] 06/2014 with HEP  . History of pyelonephritis 05/2014    hospitalization with sepsis   Past Surgical History:  Past Surgical History  Procedure Laterality Date  . Tonsillectomy  1965  . Hernia repair  1979    Left  . Cystoscopy  1992    for kidney stones  . Exploratory laparotomy  1996    with incidental appendectomy  . Appendectomy  1996  . Cardiac catheterization   2000    30% one vessel  . Knee arthroscopy  03/24/02    Right (Dr. Mauri Pole)  . Orif femoral neck fracture w/ dhs  08/03/02    Right (Dr. Mauri Pole)  . Ct abd w & pelvis wo cm  07/2001    Scarring of right lung, stable negative o/w  . Ct abd w & pelvis wo cm  11/2000    ? stones, right LL scarring  . V/q scan  06/1999    negative  . [place]oscopy  1998    [place]  . US echocardiography  12/28/2007    Mild aortic valve calcification EF 55%, basically nml  . Carotid u/s  12/28/2007    nml  . Eeg  11/12/2009    nml  . Mri  11/2009    Head, nml  . Cataract extraction  Jan, March 2011    bilateral  . Colonoscopy  07/2010    5 polyps, adenomatous, rec rpt 3 yrs  . Esophagogastroduodenoscopy  02/2012    dilation of schatzki's ring [name]  . Upper gastrointestinal endoscopy    . Colonoscopy  04/2014    3 polyps, adenomatous, f/u open ended given age [name]   HPI:  81 y.o. year old male  with significant past medical history of dementia, CAD, stage 3 CKD, hx/o VTE on coumadin, osteoporosis, essential tremor presenting with unwitnessed fall. Pt has had 3 esophageal dilations per wife. MBS completed 02/05/2012 recommended regular diet and thin liquids with intermittent penetration of thin liquids that appeared to reflexively clear with additional swallows and throat clearing. Pt and wife deny any h/o PNA.   Assessment / Plan / Recommendation Clinical Impression  Pt has an audible swallow with thin liquids, but does not show overt s/s of aspiration across consistencies. Pt and wife believe that he may need to have his esophagus stretched, and subjective c/o difficulty swallowing pills may be related to primary esophageal component given history. For now would continue with regular diet and thin liquids with education provided for selection of softer, more moist foods. Would try pills in purees - may need to crush larger ones. Will follow briefly for tolerance with modifications recommended today,  however patient may benefit from GI w/u.    Aspiration Risk  Mild    Diet Recommendation Regular;Thin liquid   Liquid Administration via: Cup;Straw Medication Administration: Whole meds with puree Supervision: Patient able to self feed;Intermittent supervision to cue for compensatory strategies Compensations: Slow rate;Small sips/bites Postural Changes and/or Swallow Maneuvers: Seated upright 90 degrees;Upright 30-60 min after meal    Other  Recommendations Recommended Consults: Consider GI evaluation Oral Care Recommendations: Oral care BID   Follow Up Recommendations  None    Frequency and Duration min 1 x/week  1 week   Pertinent Vitals/Pain n/a    SLP Swallow Goals     Swallow Study Prior Functional Status       General HPI: 81 y.o. year old male with significant past medical history of dementia, CAD, stage 3 CKD, hx/o VTE on coumadin, osteoporosis, essential tremor presenting with unwitnessed fall. Pt has had 3 esophageal dilations per wife. MBS completed 02/05/2012 recommended regular diet and thin liquids with intermittent penetration of thin liquids that appeared to reflexively clear with additional swallows and throat clearing. Pt and wife deny any h/o PNA. Type of Study: Bedside swallow evaluation Previous Swallow Assessment: see HPI Diet Prior to this Study: Regular;Thin liquids Temperature Spikes Noted: No Respiratory Status: Nasal cannula History of Recent Intubation: No Behavior/Cognition: Alert;Cooperative;Pleasant mood Oral Cavity - Dentition: Adequate natural dentition Self-Feeding Abilities: Needs assist (tremor) Patient Positioning: Partially reclined (due to pain) Baseline Vocal Quality: Hoarse;Low vocal intensity    Oral/Motor/Sensory Function Overall Oral Motor/Sensory Function: Appears within functional limits for tasks assessed   Ice Chips Ice chips: Not tested   Thin Liquid Thin Liquid: Impaired Presentation: Straw;Self Fed Pharyngeal  Phase  Impairments: Other (comments) (audible swallow)    Nectar Thick Nectar Thick Liquid: Not tested   Honey Thick Honey Thick Liquid: Not tested   Puree Puree: Within functional limits Presentation: Spoon   Solid      Solid: Within functional limits      Germain Osgood, M.A. CCC-SLP [phone]  Germain Osgood 11/07/2014,3:16 PM

## 2014-11-07 NOTE — Telephone Encounter (Signed)
PLEASE NOTE: All timestamps contained within this report are represented as [place] Standard Time. CONFIDENTIALTY NOTICE: This fax transmission is intended only for the addressee. It contains information that is legally privileged, confidential or otherwise protected from use or disclosure. If you are not the intended recipient, you are strictly prohibited from reviewing, disclosing, copying using or disseminating any of this information or taking any action in reliance on or regarding this information. If you have received this fax in error, please notify us immediately by telephone so that we can arrange for its return to [place]. Phone: [phone], Toll-Free: [phone], Fax: [phone] Page: 1 of 1 Call Id: [id number] [place] Patient Name: [name] Gender: Male DOB: March 06, 1931 Age: 16 Y 3 M 26 D Return Phone Number: [phone] (Primary) [place] [address] Client [place] Night - Client Client Site [place] Physician Ria Bush Contact Type Call Call Type Triage / Clinical Caller Name Jarome Trull Relationship To Patient Spouse Return Phone Number [phone] (Primary) Chief Complaint Paging or Request for Consult Initial Comment Caller states her husband fell and hit his head on concrete. Caller states EMS has brought him to [place] states she is needing the Dr paged so she can speak with him since he was just seen in the office today. Nurse Assessment Guidelines Guideline Title Affirmed Question Affirmed Notes Nurse Date/Time (Eastern Time) Disp. Time Eilene Ghazi Time) Disposition Final User 11/06/2014 5:26:27 PM Clinical Call Yes Mechele Dawley, RN, Amy After Care Instructions Given Call Event Type User Date / Time Description Comments User: Susanne Borders, RN Date/Time  Eilene Ghazi Time): 11/06/2014 5:25:58 PM SPOUSE STATES THAT SHE IS JUST CALLING AND WANTS THE MD NOTIFIED THAT HE IS IN THE ER RIGHT NOW. SHE STATES THAT HE WAS SEEN IN THE OFFICE TODAY AND THE MD HAD ORDERED SOME TEST ON HIM. SHE STATES THAT EMS BROUGHT THEM INTO THE ED. SHE JUST WANTED THE MD TO BE NOTIFIED. WILL CLOSE THS CALL AND WILL SEND THIS INFORMATION OVER.

## 2014-11-07 NOTE — Progress Notes (Signed)
Patient ID: [name], male   DOB: 09-Jun-1931, 81 y.o.   MRN: [id number]   LOS: 1 day   Subjective: Feeling a little better today. Denies N/V. Abdominal pain specifically is not as bad.   Objective: Vital signs in last 24 hours: Temp:  [97.9 F (36.6 C)-98.7 F (37.1 C)] 98 F (36.7 C) (03/01 0705) Pulse Rate:  [70-90] 70 (03/01 0705) Resp:  [15-27] 18 (03/01 0705) BP: (86-131)/(43-61) 118/58 mmHg (03/01 0705) SpO2:  [92 %-100 %] 97 % (03/01 0705) Weight:  [161 lb 12 oz (73.369 kg)-169 lb 8.5 oz (76.9 kg)] 169 lb 8.5 oz (76.9 kg) (03/01 0453) Last BM Date: 11/06/14   Laboratory  CBC  Recent Labs  11/06/14 1750 11/07/14 0454  WBC 12.8* 10.2  HGB 12.3* 11.3*  HCT 35.8* 33.9*  PLT 131* 102*   BMET  Recent Labs  11/06/14 1750 11/07/14 0454  NA 138 143  K 4.0 4.8  CL 107 106  CO2 27 29  GLUCOSE 185* 166*  BUN 26* 29*  CREATININE 1.88* 1.79*  CALCIUM 9.2 9.5    Physical Exam General appearance: alert and no distress Resp: clear to auscultation bilaterally, severe bilateral pain with lateral chest compression Cardio: regular rate and rhythm GI: Soft, mod TTP RLQ, no rebound or guarding, +BS   Assessment/Plan: Fall C7 fx -- C-collar (will order Aspen) Abdominal pain -- I suspect pain is musculoskeletal in nature given excellent BS and normal WBC. I think there's a good chance he has occult rib fractures that may account for some of his symptoms. I do not think further imaging is warranted at this time. Aggressive pulmonary toilet. Multiple medical problems -- per primary service    Lisette Abu, PA-C Pager: [phone] General Trauma PA Pager: [phone]  11/07/2014

## 2014-11-07 NOTE — Progress Notes (Signed)
Notified by NT that patient's temperature was 101.3 from 98.6 (at 1833). Also noted is that diastolic is 47. Notified Baltazar Najjar NP to see if she would like for patient to receive blood cultures. Will continue to update and monitor accordingly.

## 2014-11-07 NOTE — Telephone Encounter (Signed)
Can we call for update? Pt currently hospitalized after fall

## 2014-11-07 NOTE — Progress Notes (Signed)
[facility] PROGRESS NOTE  [name] MWU:[id number] DOB: 08-Mar-1931 DOA: 11/06/2014 PCP: Ria Bush, MD  Assessment/Plan: 1. ?Syncope- patient had unwitnessed fall at home, workup for syncope is underway. MRI brain showed no acute intracranial infarct. 2-D echo has been done which shows EF 65-70%, with grade 1 diastolic dysfunction. Telemetry is showing normal sinus rhythm. Will check serial cardiac enzymes troponin every 6 hours 3, first set of enzymes in the ED was negative. Called cardiology to set up outpatient Holter monitoring. We will contact the patient after discharge. Chest x-ray showed no acute disease 2. C7 cervical spine fracture- CT cervical spine showed acute fracture of C7 vertebral body with mild compression of the superior and inferior endplate. Neurosurgery was consulted last night by the ED physician who recommended hard collar and follow-up in the clinic. I called and spoke to the office of [place] neurosurgery, Dr. Annette Stable was on call last night. I've explained to them that family is very anxious to meet the neurosurgeon. Dr. Annette Stable to follow. 3. Status post fall- lumbar spine x-ray showed no acute abnormality, thoracic spine x-ray showed no acute ability. 4. Acute on C KD- patient has C KD stage III, now presenting with elevated BUN/creatinine 29/1.79, baseline creatinine 1.46. Will monitor the BMP in a.m. we'll start gentle IV hydration with normal saline at 75 mL per hour 5. History of venous thrombus embolism- patient is on warfarin, which has been continued during this admission. No intracranial bleeding per CT head. No absolute contraindication for warfarin at this time. INR is 2.13.  6. Hyperlipidemia- continue fenofibrate, Lipitor 7. Depression- continue Effexor 75 mg by mouth daily  Code Status: Full code Family Communication: Discussed with patient's wife at bedside Disposition Plan: To be decided   Consultants:  Trauma  surgery  Procedures:  Echocardiogram  Antibiotics:  None  HPI/Subjective: 81 year old male with a history of dementia, CAD, stage III C KD, history of venous thromboembolism on Coumadin who came to the ED after he had an unwitnessed fall. Patient was found to have C7 vertebral body fracture with mild compression on CT cervical spine. ED resident Discussed with neurosurgeon on call who recommended hard collar and follow-up in the clinic. [facility] was admitted to [facility] for further workup of possible syncope  This morning patient denies any pain, neck collar in place. Wife is very anxious to know when the neurosurgeon is coming.  Objective: Filed Vitals:   11/07/14 1026  BP: 111/43  Pulse: 75  Temp: 98.2 F (36.8 C)  Resp: 18    Intake/Output Summary (Last 24 hours) at 11/07/14 1525 Last data filed at 11/07/14 1400  Gross per 24 hour  Intake 491.25 ml  Output      0 ml  Net 491.25 ml   Filed Weights   11/07/14 0035 11/07/14 0453  Weight: 74.2 kg (163 lb 9.3 oz) 76.9 kg (169 lb 8.5 oz)    Exam:   General:  Appears in no acute distress, neck collar in place  Cardiovascular: S1-S2 is regular  Respiratory: Clear to auscultation bilaterally  Abdomen: Soft nontender no organomegaly  Musculoskeletal: *No edema noted of the lower extremities  Data Reviewed: Basic Metabolic Panel:  Recent Labs Lab 11/06/14 1750 11/07/14 0454  NA 138 143  K 4.0 4.8  CL 107 106  CO2 27 29  GLUCOSE 185* 166*  BUN 26* 29*  CREATININE 1.88* 1.79*  CALCIUM 9.2 9.5  MG 1.8  --    Liver Function Tests:  Recent Labs Lab 11/06/14 1750 11/07/14 0454  AST 89* 84*  ALT 57* 54*  ALKPHOS 38* 29*  BILITOT 0.8 1.0  PROT 5.8* 5.3*  ALBUMIN 3.5 3.2*   No results for input(s): LIPASE, AMYLASE in the last 168 hours. No results for input(s): AMMONIA in the last 168 hours. CBC:  Recent Labs Lab 11/06/14 1750 11/07/14 0454 11/07/14 0949  WBC 12.8* 10.2 9.2   NEUTROABS 11.3* 8.9* 7.5  HGB 12.3* 11.3* 11.1*  HCT 35.8* 33.9* 33.6*  MCV 104.7* 106.9* 106.0*  PLT 131* 102* 105*   Cardiac Enzymes: No results for input(s): CKTOTAL, CKMB, CKMBINDEX, TROPONINI in the last 168 hours. BNP (last 3 results) No results for input(s): BNP in the last 8760 hours.  ProBNP (last 3 results)  Recent Labs  06/03/14 0650  PROBNP 6915.0*    CBG:  Recent Labs Lab 11/07/14 0659  GLUCAP 142*    No results found for this or any previous visit (from the past 240 hour(s)).   Studies: X-ray Chest Pa And Lateral  11/06/2014   CLINICAL DATA:  Syncope, fall yesterday, back pain  EXAM: CHEST  2 VIEW  COMPARISON:  06/02/2014  FINDINGS: Cardiomediastinal silhouette is stable. No acute infiltrate or pleural effusion. No pulmonary edema. Mild degenerative changes thoracic spine.  IMPRESSION: No active cardiopulmonary disease.   Electronically Signed   By: Lahoma Crocker M.D.   On: 11/06/2014 22:42   Dg Thoracic Spine 2 View  11/06/2014   CLINICAL DATA:  Unwitnessed fall  EXAM: THORACIC SPINE - 2 VIEW  COMPARISON:  CT chest dated 06/03/2014  FINDINGS: Normal thoracic kyphosis.  No evidence of fracture or dislocation. Vertebral body heights are maintained.  Moderate multilevel degenerative changes.  Visualized lungs are notable for calcified right hilar nodes.  IMPRESSION: No fracture or dislocation is seen.   Electronically Signed   By: Julian Hy M.D.   On: 11/06/2014 19:10   Dg Lumbar Spine Complete  11/06/2014   CLINICAL DATA:  Fall about 2 stairs  EXAM: LUMBAR SPINE - COMPLETE 4+ VIEW  COMPARISON:  06/06/2014  FINDINGS: Five views of the lumbar spine submitted. No acute fracture or subluxation. Multilevel anterior osteophytes are noted. There is diffuse osteopenia. Left lateral bridging osteophytes noted at L1-L2, L2-L3 and L3-L4 level. Mild disc space flattening at L5-S1 level. Alignment and vertebral body heights are preserved.  IMPRESSION: No acute fracture or  subluxation. Diffuse osteopenia. Degenerative changes as described above.   Electronically Signed   By: Lahoma Crocker M.D.   On: 11/06/2014 19:12   Ct Head Wo Contrast  11/06/2014   CLINICAL DATA:  Pt unwitnessed fall about 2 stairs and striking head on the concrete. Hematoma to head pt on coumadin., unknown LOC. Pain all over  EXAM: CT HEAD WITHOUT CONTRAST  CT CERVICAL SPINE WITHOUT CONTRAST  TECHNIQUE: Multidetector CT imaging of the head and cervical spine was performed following the standard protocol without intravenous contrast. Multiplanar CT image reconstructions of the cervical spine were also generated.  COMPARISON:  CT head cervical spine 03/21/2013.  FINDINGS: CT HEAD FINDINGS  No intracranial hemorrhage. No parenchymal contusion. No midline shift or mass effect. Basilar cisterns are patent. No skull base fracture. No fluid in the paranasal sinuses or mastoid air cells. Orbits are normal.  Generalized cortical atrophy and periventricular white matter hypodensities not changed from prior.  CT CERVICAL SPINE FINDINGS  No prevertebral soft tissue swelling. Normal alignment of cervical vertebral bodies.  There is bulky osteophytosis with bridging  osteophytes extending from C2 through C7. There is some separation the osteophyte at C7 from the anterior vertebral body but this not changed comparison exam.  There is mild compression superior inferior endplates is C7 (image 28, sagittal series. There is mild widening of the disc at this level. Overall loss of to body height is minimal (10%).  No evidence epidural or paraspinal hematoma.  IMPRESSION: 1. No intracranial trauma. 2. Atrophy and microvascular disease unchanged. 3. Acute fracture of the C7 vertebral body with mild compression of the superior and inferior endplates. 4. Bulky osteophytosis from C to through C7 anteriorly. Findings conveyed toWickline MDon 11/06/2014  at20:09.   Electronically Signed   By: Suzy Bouchard M.D.   On: 11/06/2014 20:09    Ct Cervical Spine Wo Contrast  11/06/2014   CLINICAL DATA:  Pt unwitnessed fall about 2 stairs and striking head on the concrete. Hematoma to head pt on coumadin., unknown LOC. Pain all over  EXAM: CT HEAD WITHOUT CONTRAST  CT CERVICAL SPINE WITHOUT CONTRAST  TECHNIQUE: Multidetector CT imaging of the head and cervical spine was performed following the standard protocol without intravenous contrast. Multiplanar CT image reconstructions of the cervical spine were also generated.  COMPARISON:  CT head cervical spine 03/21/2013.  FINDINGS: CT HEAD FINDINGS  No intracranial hemorrhage. No parenchymal contusion. No midline shift or mass effect. Basilar cisterns are patent. No skull base fracture. No fluid in the paranasal sinuses or mastoid air cells. Orbits are normal.  Generalized cortical atrophy and periventricular white matter hypodensities not changed from prior.  CT CERVICAL SPINE FINDINGS  No prevertebral soft tissue swelling. Normal alignment of cervical vertebral bodies.  There is bulky osteophytosis with bridging osteophytes extending from C2 through C7. There is some separation the osteophyte at C7 from the anterior vertebral body but this not changed comparison exam.  There is mild compression superior inferior endplates is C7 (image 28, sagittal series. There is mild widening of the disc at this level. Overall loss of to body height is minimal (10%).  No evidence epidural or paraspinal hematoma.  IMPRESSION: 1. No intracranial trauma. 2. Atrophy and microvascular disease unchanged. 3. Acute fracture of the C7 vertebral body with mild compression of the superior and inferior endplates. 4. Bulky osteophytosis from C to through C7 anteriorly. Findings conveyed toWickline MDon 11/06/2014  at20:09.   Electronically Signed   By: Suzy Bouchard M.D.   On: 11/06/2014 20:09   Mri Brain Without Contrast  11/07/2014   CLINICAL DATA:  Initial evaluation for acute trauma, question syncope.  EXAM: MRI HEAD  WITHOUT CONTRAST  TECHNIQUE: Multiplanar, multiecho pulse sequences of the brain and surrounding structures were obtained without intravenous contrast.  COMPARISON:  Prior CT from earlier the same day.  FINDINGS: Diffuse prominence of the CSF containing spaces is compatible with generalized cerebral atrophy. Patchy and confluent T2/FLAIR hyperintensity within the periventricular white matter most consistent with chronic small vessel ischemic disease, fairly mild for patient age. Small vessel type changes present within the central pons as well.  No abnormal foci of restricted diffusion to suggest acute intracranial infarct. Gray-white matter differentiation maintained. Normal intravascular flow voids are present.  No mass lesion or midline shift. No mass effect. Ventricular prominence related global parenchymal volume loss present without hydrocephalus. No extra-axial fluid collection. No acute or chronic intracranial hemorrhage.  Craniocervical junction within normal limits. Pituitary gland unremarkable.  No acute abnormality seen about the orbits.  Mild mucoperiosteal thickening present within the ethmoidal air cells. No  air-fluid levels within the paranasal sinuses to suggest active infection. No mastoid effusion.  Bone marrow signal intensity normal.  IMPRESSION: 1. No acute intracranial infarct or other abnormality identified. 2. Atrophy with mild chronic small vessel ischemic disease.   Electronically Signed   By: Jeannine Boga M.D.   On: 11/07/2014 00:55    Scheduled Meds: . sodium chloride  3 mL Intravenous Q12H  . warfarin  1.5 mg Oral ONCE-1800  . Warfarin - Pharmacist Dosing Inpatient   Does not apply q1800   Continuous Infusions: . sodium chloride 75 mL/hr at 11/07/14 1039    Active Problems:   Fall   Abdominal pain, generalized    Time spent: *25 min    [facility] Pager [phone] If 7PM-7AM, please contact night-coverage at [email or web address], password  [facility] 11/07/2014, 3:25 PM  LOS: 1 day

## 2014-11-07 NOTE — Progress Notes (Signed)
SLP Cancellation Note  Patient Details Name: [name] MRN: [id number] DOB: 02-Apr-1931   Cancelled treatment:       Reason Eval/Treat Not Completed: Other (comment): pt and wife requesting SLP return at another time for swallow evaluation as their pastor is currently visiting. Will return as able.   Germain Osgood, M.A. CCC-SLP [phone]  Germain Osgood 11/07/2014, 2:30 PM

## 2014-11-07 NOTE — Progress Notes (Signed)
  Echocardiogram 2D Echocardiogram has been performed.  Lysle Rubens 11/07/2014, 2:09 PM

## 2014-11-07 NOTE — Progress Notes (Signed)
Pt arrived to floor in room 4N28 from ED. Tele placed. Pt made comfortable. VSS. Wife currently at bedside. Oriented to room and equipment. Will continue to monitor.

## 2014-11-08 ENCOUNTER — Encounter: Payer: Medicare Other | Admitting: Family Medicine

## 2014-11-08 DIAGNOSIS — N183 Chronic kidney disease, stage 3 (moderate): Secondary | ICD-10-CM

## 2014-11-08 DIAGNOSIS — R55 Syncope and collapse: Secondary | ICD-10-CM

## 2014-11-08 LAB — URINALYSIS, ROUTINE W REFLEX MICROSCOPIC
BILIRUBIN URINE: NEGATIVE
Glucose, UA: NEGATIVE mg/dL
Ketones, ur: NEGATIVE mg/dL
Leukocytes, UA: NEGATIVE
NITRITE: NEGATIVE
Protein, ur: NEGATIVE mg/dL
SPECIFIC GRAVITY, URINE: 1.017 (ref 1.005–1.030)
UROBILINOGEN UA: 0.2 mg/dL (ref 0.0–1.0)
pH: 6 (ref 5.0–8.0)

## 2014-11-08 LAB — COMPREHENSIVE METABOLIC PANEL
ALBUMIN: 3 g/dL — AB (ref 3.5–5.2)
ALT: 48 U/L (ref 0–53)
AST: 68 U/L — ABNORMAL HIGH (ref 0–37)
Alkaline Phosphatase: 34 U/L — ABNORMAL LOW (ref 39–117)
Anion gap: 5 (ref 5–15)
BUN: 25 mg/dL — ABNORMAL HIGH (ref 6–23)
CO2: 25 mmol/L (ref 19–32)
CREATININE: 1.62 mg/dL — AB (ref 0.50–1.35)
Calcium: 8.7 mg/dL (ref 8.4–10.5)
Chloride: 108 mmol/L (ref 96–112)
GFR calc Af Amer: 44 mL/min — ABNORMAL LOW (ref >90)
GFR calc non Af Amer: 38 mL/min — ABNORMAL LOW (ref >90)
Glucose, Bld: 124 mg/dL — ABNORMAL HIGH (ref 70–99)
Potassium: 4.4 mmol/L (ref 3.5–5.1)
Sodium: 138 mmol/L (ref 135–145)
TOTAL PROTEIN: 5.6 g/dL — AB (ref 6.0–8.3)
Total Bilirubin: 1.3 mg/dL — ABNORMAL HIGH (ref 0.3–1.2)

## 2014-11-08 LAB — URINE CULTURE: Colony Count: 50000

## 2014-11-08 LAB — PROTIME-INR
INR: 2.25 — AB (ref 0.00–1.49)
Prothrombin Time: 25.1 seconds — ABNORMAL HIGH (ref 11.6–15.2)

## 2014-11-08 LAB — URINE MICROSCOPIC-ADD ON

## 2014-11-08 LAB — LACTIC ACID, PLASMA: Lactic Acid, Venous: 1 mmol/L (ref 0.5–2.0)

## 2014-11-08 LAB — TROPONIN I: Troponin I: 0.03 ng/mL (ref <0.031)

## 2014-11-08 MED ORDER — SODIUM CHLORIDE 0.9 % IV SOLN
250.0000 mg | Freq: Four times a day (QID) | INTRAVENOUS | Status: DC
  Administered 2014-11-08 – 2014-11-09 (×6): 250 mg via INTRAVENOUS
  Filled 2014-11-08 (×9): qty 250

## 2014-11-08 MED ORDER — TRAMADOL HCL 50 MG PO TABS: 50.0000 mg | ORAL_TABLET | Freq: Four times a day (QID) | ORAL | Status: DC | PRN

## 2014-11-08 MED ORDER — SENNOSIDES-DOCUSATE SODIUM 8.6-50 MG PO TABS
1.0000 | ORAL_TABLET | Freq: Two times a day (BID) | ORAL | Status: DC | PRN
  Administered 2014-11-08: 1 via ORAL
  Filled 2014-11-08: qty 1

## 2014-11-08 MED ORDER — OXYCODONE HCL 5 MG PO TABS
5.0000 mg | ORAL_TABLET | ORAL | Status: DC | PRN
  Administered 2014-11-08 – 2014-11-09 (×3): 5 mg via ORAL
  Filled 2014-11-08 (×3): qty 1

## 2014-11-08 MED ORDER — GUAIFENESIN-DM 100-10 MG/5ML PO SYRP: 5.0000 mL | ORAL_SOLUTION | ORAL | Status: DC | PRN

## 2014-11-08 MED ORDER — BISACODYL 10 MG RE SUPP
10.0000 mg | Freq: Every day | RECTAL | Status: DC | PRN
  Administered 2014-11-09: 10 mg via RECTAL
  Filled 2014-11-08: qty 1

## 2014-11-08 MED ORDER — OXYCODONE-ACETAMINOPHEN 5-325 MG PO TABS: 1.0000 | ORAL_TABLET | ORAL | Status: DC | PRN

## 2014-11-08 MED ORDER — GUAIFENESIN ER 600 MG PO TB12
600.0000 mg | ORAL_TABLET | Freq: Two times a day (BID) | ORAL | Status: DC
  Administered 2014-11-08 – 2014-11-09 (×3): 600 mg via ORAL
  Filled 2014-11-08 (×4): qty 1

## 2014-11-08 MED ORDER — WARFARIN SODIUM 3 MG PO TABS
3.0000 mg | ORAL_TABLET | Freq: Once | ORAL | Status: AC
  Administered 2014-11-08: 3 mg via ORAL
  Filled 2014-11-08: qty 1

## 2014-11-08 NOTE — Progress Notes (Signed)
[facility] for coumadin Indication: hx  pulmonary embolus  Allergies  Allergen Reactions  . Lactose Intolerance (Gi)   . Nizatidine     REACTION: Rash (Axid)  . Penicillins     REACTION: Rash  . Sulfadiazine     REACTION: Hives    Patient Measurements: Height: 5\' 8"  (172.7 cm) Weight: 169 lb 8.5 oz (76.9 kg) IBW/kg (Calculated) : 68.4   Vital Signs: Temp: 97.9 F (36.6 C) (03/02 0749) Temp Source: Oral (03/02 0749) BP: 114/55 mmHg (03/02 0749) Pulse Rate: 88 (03/02 0749)  Labs:  Recent Labs  11/06/14 1750 11/07/14 0454 11/07/14 0949 11/07/14 1559 11/07/14 1600 11/07/14 2056 11/08/14 0405  HGB 12.3* 11.3* 11.1* 10.8*  --  11.1*  --   HCT 35.8* 33.9* 33.6* 32.7*  --  34.0*  --   PLT 131* 102* 105* 94*  --  93*  --   LABPROT 24.1* 24.0*  --   --   --   --  25.1*  INR 2.14* 2.13*  --   --   --   --  2.25*  CREATININE 1.88* 1.79*  --   --   --   --  1.62*  TROPONINI  --   --   --   --  <0.03 <0.03 0.03    Estimated Creatinine Clearance: 34.6 mL/min (by C-G formula based on Cr of 1.62).  Assessment: Patent is an 81 y.o M on coumadin PTA for hx PE.  He was admitted on 3/29 s/p unwitnessed fall and found to have C7 compression fracture.  INR remains at goal today with 2.25.  No bleeding documented. Will continue home regimen.  PTA dose: 1.5 mg qTues, 3 mg po all other days   Goal of Therapy:  INR 2-3    Plan:  - coumadin 3 mg PO x1 - daily INR - monitor for s/sx of bleeding    Hughes Better, PharmD, BCPS Clinical Pharmacist Pager: [phone] 11/08/2014 8:28 AM

## 2014-11-08 NOTE — Evaluation (Signed)
Physical Therapy Evaluation Patient Details Name: [name] MRN: [id number] DOB: [birth year] Today's Date: 11/08/2014   History of Present Illness  81 y.o. 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 Syncope, abd pain. Imaging revealed 1.  acute fracture of C7 vertebral body, neuro MD recommending hard collar at all times.   Clinical Impression  Pt adm due to above pt presents with significant decline in functional mobility secondary to deficits indicated below (see PT problem list). Pt to benefit from skilled acute PT to address deficits and maximize functional mobility. Recommend SNF a this time due to lack of mobility and incr (A) required. Pt is a fall risk.   Follow Up Recommendations SNF;Supervision/Assistance - 24 hour    Equipment Recommendations  None recommended by PT    Recommendations for Other Services       Precautions / Restrictions Precautions Precautions: Fall;Cervical Precaution Comments: reviewed precautions with pt and wife Required Braces or Orthoses: Cervical Brace Cervical Brace: Hard collar;At all times Restrictions Weight Bearing Restrictions: No      Mobility  Bed Mobility Overal bed mobility: Needs Assistance Bed Mobility: Rolling;Sidelying to Sit;Sit to Sidelying Rolling: Min assist Sidelying to sit: Mod assist     Sit to sidelying: Max assist General bed mobility comments: cues for log rolling technique and (A) to perform   Transfers Overall transfer level: Needs assistance Equipment used: Rolling walker (2 wheeled) Transfers: Sit to/from Stand Sit to Stand: Mod assist         General transfer comment: pt unsteady and leaning anteriorly; pt very shaky and unsteady; leaning posteriorly throughout; unable to shift weight and pivot to chair   Ambulation/Gait             General Gait Details: unsafe to  ambulate at this time; recommend 2 for safety   Stairs            Wheelchair Mobility    Modified Rankin (Stroke Patients Only)       Balance Overall balance assessment: Needs assistance Sitting-balance support: Feet supported;Single extremity supported;Bilateral upper extremity supported Sitting balance-Leahy Scale: Poor Sitting balance - Comments: pt requiring UE support to maintain EOB sitting; leaning at times to Lt; cues for upright posture ; sat EOB ~ 9 min Postural control: Posterior lean;Left lateral lean Standing balance support: During functional activity;Bilateral upper extremity supported Standing balance-Leahy Scale: Zero Standing balance comment: mod (A) to balance; heavy lean posteriorly                              Pertinent Vitals/Pain Pain Assessment: Faces Faces Pain Scale: Hurts whole lot Pain Location: c/o pain in back and neck with movement  Pain Descriptors / Indicators: Aching;Sore Pain Intervention(s): Monitored during session;Premedicated before session;Repositioned;Limited activity within patient's tolerance    Home Living Family/patient expects to be discharged to:: Skilled nursing facility Living Arrangements: Spouse/significant other               Additional Comments: uses cane generally in and outside.    Prior Function Level of Independence: Independent with assistive device(s)               Hand Dominance        Extremity/Trunk Assessment   Upper Extremity Assessment: Defer to OT evaluation           Lower Extremity  Assessment: Generalized weakness      Cervical / Trunk Assessment: Normal  Communication   Communication: No difficulties  Cognition Arousal/Alertness: Awake/alert Behavior During Therapy: Flat affect Overall Cognitive Status: History of cognitive impairments - at baseline       Memory: Decreased recall of precautions              General Comments General comments (skin  integrity, edema, etc.): discussed D/C recommendations with pt and wife     Exercises Low Level/ICU Exercises Ankle Circles/Pumps: AROM;Both;10 reps      Assessment/Plan    PT Assessment Patient needs continued PT services  PT Diagnosis Difficulty walking;Generalized weakness;Acute pain   PT Problem List Decreased strength;Decreased activity tolerance;Decreased balance;Decreased mobility;Decreased cognition;Decreased knowledge of use of DME;Decreased safety awareness;Decreased knowledge of precautions;Pain  PT Treatment Interventions DME instruction;Gait training;Functional mobility training;Therapeutic activities;Therapeutic exercise;Balance training;Neuromuscular re-education;Patient/family education;Cognitive remediation   PT Goals (Current goals can be found in the Care Plan section) Acute Rehab PT Goals Patient Stated Goal: to get better and then go home per wife PT Goal Formulation: With patient/family Time For Goal Achievement: 11/15/14 Potential to Achieve Goals: Fair    Frequency Min 3X/week   Barriers to discharge        Co-evaluation               End of Session Equipment Utilized During Treatment: Gait belt;Cervical collar Activity Tolerance: Patient limited by fatigue;Patient limited by pain Patient left: in bed;with call bell/phone within reach;with bed alarm set;with family/visitor present Nurse Communication: Mobility status;Precautions         Time: 1104-1130 PT Time Calculation (min) (ACUTE ONLY): 26 min   Charges:   PT Evaluation $Initial PT Evaluation Tier I: 1 Procedure PT Treatments $Therapeutic Activity: 8-22 mins   PT G CodesGustavus Bryant, [place]  [phone] 11/08/2014, 12:48 PM

## 2014-11-08 NOTE — Progress Notes (Signed)
OT Cancellation Note  Patient Details Name: [name] MRN: [id number] DOB: 1931/02/21   Cancelled Treatment:    Reason Eval/Treat Not Completed: Other (comment) Pt has Medicare and current D/C plan is SNF. No apparent immediate acute care OT needs, therefore will defer OT to SNF. If OT eval is needed please call Acute Rehab Dept. at [phone] or text page OT at [phone].    Villa Herb M   Cyndie Chime, OTR/L Occupational Therapist [phone] (pager)  11/08/2014, 4:01 PM

## 2014-11-08 NOTE — Progress Notes (Signed)
CARE MANAGEMENT NOTE 11/08/2014  Patient:  JERMALE, CRASS   Account Number:  [id number]  Date Initiated:  11/08/2014  Documentation initiated by:  Olga Coaster  Subjective/Objective Assessment:   ADMITTED WITH SYNCOPAL EPISODE, COMRPESSION FX, PNEUMONIA     Action/Plan:   CM FOLLOWING FOR DCP   Anticipated DC Date:  11/13/2014   Anticipated DC Plan: AWAITING FOR PT/OT EVALS FOR DISPOSITION NEEDS  In-house referral  Clinical Social Worker      DC Planning Services  CM consult         Status of service:  In process, will continue to follow  Per UR Regulation:  Reviewed for med. necessity/level of care/duration of stay  Comments:  3/2/2016Mindi Slicker RN,BSN,MHA [phone]

## 2014-11-08 NOTE — Progress Notes (Signed)
Patient ID: [name], male   DOB: [birth year], 81 y.o.   MRN: [id number]   LOS: 2 days   Subjective: Feeling rough this morning.   Objective: Vital signs in last 24 hours: Temp:  [97.9 F (36.6 C)-101.3 F (38.5 C)] 97.9 F (36.6 C) (03/02 0749) Pulse Rate:  [72-88] 88 (03/02 0749) Resp:  [18-20] 18 (03/02 0749) BP: (111-129)/(43-62) 114/55 mmHg (03/02 0749) SpO2:  [90 %-97 %] 94 % (03/02 0749) Last BM Date: 11/06/14   Laboratory  BMET  Recent Labs  11/07/14 0454 11/08/14 0405  NA 143 138  K 4.8 4.4  CL 106 108  CO2 29 25  GLUCOSE 166* 124*  BUN 29* 25*  CREATININE 1.79* 1.62*  CALCIUM 9.5 8.7    Physical Exam General appearance: alert and no distress Resp: clear to auscultation bilaterally Cardio: regular rate and rhythm GI: Soft, diminished BS, mild-to-mod TTP RLQ, RUQ   Assessment/Plan: Fall C7 fx -- C-collar  Abdominal pain -- I suspect pain is musculoskeletal in nature given excellent BS and normal WBC. Pain persistent. I think there's a good chance he has occult rib fractures that may account for some of his symptoms. Aggressive pulmonary toilet (IS not provided to patient as of yet). Add tramadol for pain control. PNA -- per primary service Multiple medical problems -- per primary service    Lisette Abu, PA-C Pager: [phone] General Trauma PA Pager: [phone]  11/08/2014

## 2014-11-08 NOTE — Progress Notes (Signed)
ANTIBIOTIC CONSULT NOTE - INITIAL  Pharmacy Consult for Primaxin  Indication: pneumonia  Allergies  Allergen Reactions  . Lactose Intolerance (Gi)   . Nizatidine     REACTION: Rash (Axid)  . Penicillins     REACTION: Rash  . Sulfadiazine     REACTION: Hives    Patient Measurements: Height: 5\' 8"  (172.7 cm) Weight: 169 lb 8.5 oz (76.9 kg) IBW/kg (Calculated) : 68.4   Vital Signs: Temp: 98.8 F (37.1 C) (03/02 0130) Temp Source: Oral (03/02 0130) BP: 122/50 mmHg (03/02 0130) Pulse Rate: 81 (03/02 0130)  Labs:  Recent Labs  11/06/14 1750 11/07/14 0454 11/07/14 0949 11/07/14 1559 11/07/14 2056  WBC 12.8* 10.2 9.2 7.9 7.9  HGB 12.3* 11.3* 11.1* 10.8* 11.1*  PLT 131* 102* 105* 94* 93*  CREATININE 1.88* 1.79*  --   --   --    Estimated Creatinine Clearance: 31.3 mL/min (by C-G formula based on Cr of 1.79).  Medical History: Past Medical History  Diagnosis Date  . GERD (gastroesophageal reflux disease)     h/o PUD  . CAD (coronary artery disease)     cath 2000 30% single vessel, normal nuclear stress test 12/03/2010, no evidence ischemia  . CKD (chronic kidney disease) stage 3, GFR 30-59 ml/min     baseline Cr 1.7  . Depression   . Hx pulmonary embolism 08/1991    coumadin  . History of phlebitis   . Allergic rhinitis   . Urge incontinence   . Elevated PSA     previous-normalized (followed by Dr. Jeffie Pollock, rec no repeat unless urinary sxs)  . HLD (hyperlipidemia)     hypertriglyceridemia  . Asthma     remote  . Arthritis   . Blood transfusion 1990's  . Schatzki's ring 02/2012    s/p dilation [name]  . History of fracture of right hip   . Hearing loss 06/2012    eval - rec annual exam  . Essential tremor 09/27/2008  . Prediabetes   . DISH (diffuse idiopathic skeletal hyperostosis) 03/2013    lumbar spine on xray  . DDD (degenerative disc disease) 03/2013    by CT, diffuse multilevel cervical and lumbar spondylosis  . Osteoporosis 11/2013    DEXA T score  -2.7  . Macrocytic anemia     stable B12/folate and periph smear 05/2013  . Sleep apnea     no CPAP- sleep apnea "cleared up 15 years ago"  . Dizziness     multifactorial, s/p PT at [facility] 06/2014 with HEP  . History of pyelonephritis 05/2014    hospitalization with sepsis    Assessment: CXR with possible PNA. WBC WNL, noted renal dysfunction. Starting Primaxin per pharmacy.   Plan:  -Primaxin 250 mg IV q6h -Trend WBC, temp, renal function  -F/U imaging  Narda Bonds 11/08/2014,3:37 AM

## 2014-11-08 NOTE — Progress Notes (Signed)
Shift event: Earlier, pt spiked fever of 101.16f. Pt already had UA which was negative and Urine culture is pending. CBC done and stable without leukocytosis. Lactate 1.0.  Blood cultures done. CXR showed multilobar PNA.  Given HAP and allergy to PCN, Imipenem started for MDR/gm neg coverage. At this time, will not start Vanc as MRSA is not suspected, but will defer to attending in am. Start PNA supportive care. Will follow. Clance Boll, NP [facility]

## 2014-11-08 NOTE — Progress Notes (Addendum)
[facility] PROGRESS NOTE  NASEEM VARDEN WNU:[id number] DOB: 06-17-1931 DOA: 11/06/2014 PCP: Ria Bush, MD  Assessment/Plan: 1. ?Syncope- patient had unwitnessed fall at home, workup for syncope is underway. MRI brain showed no acute intracranial infarct. 2-D echo has been done which shows EF 65-70%, with grade 1 diastolic dysfunction. Telemetry is showing normal sinus rhythm. Negative serial cardiac enzymes . Patient will need  outpatient Holter monitoring. We will contact the patient after discharge. Chest x-ray showed no acute disease 2. HCAP-chest x-ray showed multifocal pneumonia, blood cultures 2 drawn, started on imipenem. Speech therapy recommendations completed. Trauma services suspects rib fracture recommends aggressive pulmonary toilet. 3. C7 cervical spine fracture- CT cervical spine showed acute fracture of C7 vertebral body with mild compression of the superior and inferior endplate. Neurosurgery  recommended hard collar and follow-up in the clinic in 2 weeks for repeat x-rays. Patient seen by the trauma service this morning. 4.  Status post fall- lumbar spine x-ray showed no acute abnormality, thoracic spine x-ray showed no acute ability. 5. Acute on C KD- patient has C KD stage III,   presenting with elevated BUN/creatinine 29/1.79, creatinine slowly improving.. Will monitor the BMP and continue gentle IV hydration  6. History of venous thrombus embolism- patient is on warfarin, which has been continued during this admission. Patient's wife  states that the patient is at high risk of PE and DVT and would like to continue with this despite history of recurrent falls. No intracranial bleeding per CT head. No absolute contraindication for warfarin at this time. INR is 2.13.  7. Hyperlipidemia- continue fenofibrate, Lipitor 8. Depression- continue Effexor 75 mg by mouth daily  Code Status: Full code Family Communication: Discussed with patient's wife at  bedside Disposition Plan:  Family okay to consider SNF if indicated, PT consult pending  Consultants:  Trauma surgery  Procedures:  Echocardiogram  Antibiotics:  None  HPI 81 year old male with a history of dementia, CAD, stage III C KD, history of venous thromboembolism on Coumadin who came to the ED after he had an unwitnessed fall. Patient was found to have C7 vertebral body fracture with mild compression on CT cervical spine. ED resident Discussed with neurosurgeon on call who recommended hard collar and follow-up in the clinic. [facility] for further workup of possible syncope   Subjective Patient wife by the bedside, provides most of the history Patient was febrile up night and diagnosed with multifocal pneumonia He has a nonproductive cough  Objective: Filed Vitals:   11/08/14 0942  BP: 118/62  Pulse: 77  Temp: 98.2 F (36.8 C)  Resp: 18    Intake/Output Summary (Last 24 hours) at 11/08/14 1033 Last data filed at 11/07/14 1900  Gross per 24 hour  Intake  987.5 ml  Output      0 ml  Net  987.5 ml   Filed Weights   11/07/14 0035 11/07/14 0453  Weight: 74.2 kg (163 lb 9.3 oz) 76.9 kg (169 lb 8.5 oz)    Exam:   General:  Appears in no acute distress, neck collar in place  Cardiovascular: S1-S2 is regular  Respiratory: Clear to auscultation bilaterally  Abdomen: Soft nontender no organomegaly  Musculoskeletal: *No edema noted of the lower extremities  Data Reviewed: Basic Metabolic Panel:  Recent Labs Lab 11/06/14 1750 11/07/14 0454 11/08/14 0405  NA 138 143 138  K 4.0 4.8 4.4  CL 107 106 108  CO2 27 29 25   GLUCOSE 185* 166*  124*  BUN 26* 29* 25*  CREATININE 1.88* 1.79* 1.62*  CALCIUM 9.2 9.5 8.7  MG 1.8  --   --    Liver Function Tests:  Recent Labs Lab 11/06/14 1750 11/07/14 0454 11/08/14 0405  AST 89* 84* 68*  ALT 57* 54* 48  ALKPHOS 38* 29* 34*  BILITOT 0.8 1.0 1.3*  PROT 5.8* 5.3*  5.6*  ALBUMIN 3.5 3.2* 3.0*   No results for input(s): LIPASE, AMYLASE in the last 168 hours. No results for input(s): AMMONIA in the last 168 hours. CBC:  Recent Labs Lab 11/06/14 1750 11/07/14 0454 11/07/14 0949 11/07/14 1559 11/07/14 2056  WBC 12.8* 10.2 9.2 7.9 7.9  NEUTROABS 11.3* 8.9* 7.5 6.3 6.0  HGB 12.3* 11.3* 11.1* 10.8* 11.1*  HCT 35.8* 33.9* 33.6* 32.7* 34.0*  MCV 104.7* 106.9* 106.0* 106.9* 108.6*  PLT 131* 102* 105* 94* 93*   Cardiac Enzymes:  Recent Labs Lab 11/07/14 1600 11/07/14 2056 11/08/14 0405  TROPONINI <0.03 <0.03 0.03   BNP (last 3 results) No results for input(s): BNP in the last 8760 hours.  ProBNP (last 3 results)  Recent Labs  06/03/14 0650  PROBNP 6915.0*    CBG:  Recent Labs Lab 11/07/14 0659  GLUCAP 142*    No results found for this or any previous visit (from the past 240 hour(s)).   Studies: X-ray Chest Pa And Lateral  11/06/2014   CLINICAL DATA:  Syncope, fall yesterday, back pain  EXAM: CHEST  2 VIEW  COMPARISON:  06/02/2014  FINDINGS: Cardiomediastinal silhouette is stable. No acute infiltrate or pleural effusion. No pulmonary edema. Mild degenerative changes thoracic spine.  IMPRESSION: No active cardiopulmonary disease.   Electronically Signed   By: Lahoma Crocker M.D.   On: 11/06/2014 22:42   Dg Thoracic Spine 2 View  11/06/2014   CLINICAL DATA:  Unwitnessed fall  EXAM: THORACIC SPINE - 2 VIEW  COMPARISON:  CT chest dated 06/03/2014  FINDINGS: Normal thoracic kyphosis.  No evidence of fracture or dislocation. Vertebral body heights are maintained.  Moderate multilevel degenerative changes.  Visualized lungs are notable for calcified right hilar nodes.  IMPRESSION: No fracture or dislocation is seen.   Electronically Signed   By: Julian Hy M.D.   On: 11/06/2014 19:10   Dg Lumbar Spine Complete  11/06/2014   CLINICAL DATA:  Fall about 2 stairs  EXAM: LUMBAR SPINE - COMPLETE 4+ VIEW  COMPARISON:  06/06/2014  FINDINGS:  Five views of the lumbar spine submitted. No acute fracture or subluxation. Multilevel anterior osteophytes are noted. There is diffuse osteopenia. Left lateral bridging osteophytes noted at L1-L2, L2-L3 and L3-L4 level. Mild disc space flattening at L5-S1 level. Alignment and vertebral body heights are preserved.  IMPRESSION: No acute fracture or subluxation. Diffuse osteopenia. Degenerative changes as described above.   Electronically Signed   By: Lahoma Crocker M.D.   On: 11/06/2014 19:12   Ct Head Wo Contrast  11/06/2014   CLINICAL DATA:  Pt unwitnessed fall about 2 stairs and striking head on the concrete. Hematoma to head pt on coumadin., unknown LOC. Pain all over  EXAM: CT HEAD WITHOUT CONTRAST  CT CERVICAL SPINE WITHOUT CONTRAST  TECHNIQUE: Multidetector CT imaging of the head and cervical spine was performed following the standard protocol without intravenous contrast. Multiplanar CT image reconstructions of the cervical spine were also generated.  COMPARISON:  CT head cervical spine 03/21/2013.  FINDINGS: CT HEAD FINDINGS  No intracranial hemorrhage. No parenchymal contusion. No midline shift or mass  effect. Basilar cisterns are patent. No skull base fracture. No fluid in the paranasal sinuses or mastoid air cells. Orbits are normal.  Generalized cortical atrophy and periventricular white matter hypodensities not changed from prior.  CT CERVICAL SPINE FINDINGS  No prevertebral soft tissue swelling. Normal alignment of cervical vertebral bodies.  There is bulky osteophytosis with bridging osteophytes extending from C2 through C7. There is some separation the osteophyte at C7 from the anterior vertebral body but this not changed comparison exam.  There is mild compression superior inferior endplates is C7 (image 28, sagittal series. There is mild widening of the disc at this level. Overall loss of to body height is minimal (10%).  No evidence epidural or paraspinal hematoma.  IMPRESSION: 1. No intracranial  trauma. 2. Atrophy and microvascular disease unchanged. 3. Acute fracture of the C7 vertebral body with mild compression of the superior and inferior endplates. 4. Bulky osteophytosis from C to through C7 anteriorly. Findings conveyed toWickline MDon 11/06/2014  at20:09.   Electronically Signed   By: Suzy Bouchard M.D.   On: 11/06/2014 20:09   Ct Cervical Spine Wo Contrast  11/06/2014   CLINICAL DATA:  Pt unwitnessed fall about 2 stairs and striking head on the concrete. Hematoma to head pt on coumadin., unknown LOC. Pain all over  EXAM: CT HEAD WITHOUT CONTRAST  CT CERVICAL SPINE WITHOUT CONTRAST  TECHNIQUE: Multidetector CT imaging of the head and cervical spine was performed following the standard protocol without intravenous contrast. Multiplanar CT image reconstructions of the cervical spine were also generated.  COMPARISON:  CT head cervical spine 03/21/2013.  FINDINGS: CT HEAD FINDINGS  No intracranial hemorrhage. No parenchymal contusion. No midline shift or mass effect. Basilar cisterns are patent. No skull base fracture. No fluid in the paranasal sinuses or mastoid air cells. Orbits are normal.  Generalized cortical atrophy and periventricular white matter hypodensities not changed from prior.  CT CERVICAL SPINE FINDINGS  No prevertebral soft tissue swelling. Normal alignment of cervical vertebral bodies.  There is bulky osteophytosis with bridging osteophytes extending from C2 through C7. There is some separation the osteophyte at C7 from the anterior vertebral body but this not changed comparison exam.  There is mild compression superior inferior endplates is C7 (image 28, sagittal series. There is mild widening of the disc at this level. Overall loss of to body height is minimal (10%).  No evidence epidural or paraspinal hematoma.  IMPRESSION: 1. No intracranial trauma. 2. Atrophy and microvascular disease unchanged. 3. Acute fracture of the C7 vertebral body with mild compression of the superior  and inferior endplates. 4. Bulky osteophytosis from C to through C7 anteriorly. Findings conveyed toWickline MDon 11/06/2014  at20:09.   Electronically Signed   By: Suzy Bouchard M.D.   On: 11/06/2014 20:09   Mri Brain Without Contrast  11/07/2014   CLINICAL DATA:  Initial evaluation for acute trauma, question syncope.  EXAM: MRI HEAD WITHOUT CONTRAST  TECHNIQUE: Multiplanar, multiecho pulse sequences of the brain and surrounding structures were obtained without intravenous contrast.  COMPARISON:  Prior CT from earlier the same day.  FINDINGS: Diffuse prominence of the CSF containing spaces is compatible with generalized cerebral atrophy. Patchy and confluent T2/FLAIR hyperintensity within the periventricular white matter most consistent with chronic small vessel ischemic disease, fairly mild for patient age. Small vessel type changes present within the central pons as well.  No abnormal foci of restricted diffusion to suggest acute intracranial infarct. Gray-white matter differentiation maintained. Normal intravascular flow voids are  present.  No mass lesion or midline shift. No mass effect. Ventricular prominence related global parenchymal volume loss present without hydrocephalus. No extra-axial fluid collection. No acute or chronic intracranial hemorrhage.  Craniocervical junction within normal limits. Pituitary gland unremarkable.  No acute abnormality seen about the orbits.  Mild mucoperiosteal thickening present within the ethmoidal air cells. No air-fluid levels within the paranasal sinuses to suggest active infection. No mastoid effusion.  Bone marrow signal intensity normal.  IMPRESSION: 1. No acute intracranial infarct or other abnormality identified. 2. Atrophy with mild chronic small vessel ischemic disease.   Electronically Signed   By: Jeannine Boga M.D.   On: 11/07/2014 00:55   Dg Chest Port 1 View  11/08/2014   CLINICAL DATA:  Acute onset of fever.  Subsequent encounter.  EXAM: PORTABLE  CHEST - 1 VIEW  COMPARISON:  Chest radiograph performed 11/06/2014  FINDINGS: Bibasilar airspace opacities are increased from the prior study and concerning for multifocal pneumonia. A small left pleural effusion is noted. No pneumothorax is seen.  The cardiomediastinal silhouette is borderline normal in size. No acute osseous abnormalities are identified.  IMPRESSION: Increasing bibasilar airspace opacities are concerning for multifocal pneumonia. Small left pleural effusion noted.  These results were called by telephone at the time of interpretation on 11/08/2014 at 2:33 am to [facility], who verbally acknowledged these results.   Electronically Signed   By: Garald Balding M.D.   On: 11/08/2014 02:33    Scheduled Meds: . atorvastatin  40 mg Oral QHS  . fenofibrate  160 mg Oral Daily  . gabapentin  100 mg Oral TID  . guaiFENesin  600 mg Oral BID  . imipenem-cilastatin  250 mg Intravenous 4 times per day  . loratadine  10 mg Oral Daily  . oxybutynin  5 mg Oral BID  . sodium chloride  3 mL Intravenous Q12H  . venlafaxine  75 mg Oral Daily  . warfarin  3 mg Oral ONCE-1800  . Warfarin - Pharmacist Dosing Inpatient   Does not apply q1800   Continuous Infusions: . sodium chloride Stopped (11/07/14 1649)  . sodium chloride 0.9 % 1,000 mL infusion 75 mL/hr at 11/07/14 1648    Active Problems:   EMBOLISM & INFARCTION, IATROGENIC PULMONARY   CKD (chronic kidney disease) stage 3, GFR 30-59 ml/min   HLD (hyperlipidemia)   Dementia   Fall   Abdominal pain, generalized   Syncope    Time spent: *25 min    [facility]  [facility] Pager [phone] If 7PM-7AM, please contact night-coverage at [email or web address], password [facility] 11/08/2014, 10:33 AM  LOS: 2 days

## 2014-11-09 ENCOUNTER — Inpatient Hospital Stay (HOSPITAL_COMMUNITY): Payer: Medicare Other

## 2014-11-09 DIAGNOSIS — S129XXA Fracture of neck, unspecified, initial encounter: Secondary | ICD-10-CM | POA: Diagnosis not present

## 2014-11-09 DIAGNOSIS — R32 Unspecified urinary incontinence: Secondary | ICD-10-CM | POA: Diagnosis not present

## 2014-11-09 DIAGNOSIS — M6281 Muscle weakness (generalized): Secondary | ICD-10-CM | POA: Diagnosis not present

## 2014-11-09 DIAGNOSIS — G4710 Hypersomnia, unspecified: Secondary | ICD-10-CM | POA: Diagnosis not present

## 2014-11-09 DIAGNOSIS — J189 Pneumonia, unspecified organism: Secondary | ICD-10-CM | POA: Diagnosis not present

## 2014-11-09 DIAGNOSIS — M25519 Pain in unspecified shoulder: Secondary | ICD-10-CM | POA: Diagnosis not present

## 2014-11-09 DIAGNOSIS — W19XXXA Unspecified fall, initial encounter: Secondary | ICD-10-CM | POA: Diagnosis not present

## 2014-11-09 DIAGNOSIS — Z9089 Acquired absence of other organs: Secondary | ICD-10-CM | POA: Diagnosis not present

## 2014-11-09 DIAGNOSIS — R5381 Other malaise: Secondary | ICD-10-CM | POA: Diagnosis not present

## 2014-11-09 DIAGNOSIS — S0992XA Unspecified injury of nose, initial encounter: Secondary | ICD-10-CM | POA: Diagnosis not present

## 2014-11-09 DIAGNOSIS — F0390 Unspecified dementia without behavioral disturbance: Secondary | ICD-10-CM | POA: Diagnosis not present

## 2014-11-09 DIAGNOSIS — F329 Major depressive disorder, single episode, unspecified: Secondary | ICD-10-CM | POA: Diagnosis not present

## 2014-11-09 DIAGNOSIS — R296 Repeated falls: Secondary | ICD-10-CM | POA: Diagnosis not present

## 2014-11-09 DIAGNOSIS — Z9181 History of falling: Secondary | ICD-10-CM | POA: Diagnosis not present

## 2014-11-09 DIAGNOSIS — R791 Abnormal coagulation profile: Secondary | ICD-10-CM | POA: Diagnosis not present

## 2014-11-09 DIAGNOSIS — R300 Dysuria: Secondary | ICD-10-CM | POA: Diagnosis not present

## 2014-11-09 DIAGNOSIS — N183 Chronic kidney disease, stage 3 (moderate): Secondary | ICD-10-CM | POA: Diagnosis not present

## 2014-11-09 DIAGNOSIS — R109 Unspecified abdominal pain: Secondary | ICD-10-CM | POA: Diagnosis not present

## 2014-11-09 DIAGNOSIS — J188 Other pneumonia, unspecified organism: Secondary | ICD-10-CM | POA: Diagnosis not present

## 2014-11-09 DIAGNOSIS — S329XXA Fracture of unspecified parts of lumbosacral spine and pelvis, initial encounter for closed fracture: Secondary | ICD-10-CM | POA: Diagnosis not present

## 2014-11-09 DIAGNOSIS — E538 Deficiency of other specified B group vitamins: Secondary | ICD-10-CM | POA: Diagnosis not present

## 2014-11-09 DIAGNOSIS — S12600S Unspecified displaced fracture of seventh cervical vertebra, sequela: Secondary | ICD-10-CM | POA: Diagnosis not present

## 2014-11-09 DIAGNOSIS — D519 Vitamin B12 deficiency anemia, unspecified: Secondary | ICD-10-CM | POA: Diagnosis not present

## 2014-11-09 DIAGNOSIS — I951 Orthostatic hypotension: Secondary | ICD-10-CM | POA: Diagnosis not present

## 2014-11-09 DIAGNOSIS — G629 Polyneuropathy, unspecified: Secondary | ICD-10-CM | POA: Diagnosis not present

## 2014-11-09 DIAGNOSIS — G250 Essential tremor: Secondary | ICD-10-CM | POA: Diagnosis not present

## 2014-11-09 DIAGNOSIS — E785 Hyperlipidemia, unspecified: Secondary | ICD-10-CM | POA: Diagnosis not present

## 2014-11-09 DIAGNOSIS — T81718A Complication of other artery following a procedure, not elsewhere classified, initial encounter: Secondary | ICD-10-CM | POA: Diagnosis not present

## 2014-11-09 DIAGNOSIS — R079 Chest pain, unspecified: Secondary | ICD-10-CM | POA: Diagnosis not present

## 2014-11-09 DIAGNOSIS — R55 Syncope and collapse: Secondary | ICD-10-CM | POA: Diagnosis not present

## 2014-11-09 DIAGNOSIS — I2699 Other pulmonary embolism without acute cor pulmonale: Secondary | ICD-10-CM | POA: Diagnosis not present

## 2014-11-09 DIAGNOSIS — S12601D Unspecified nondisplaced fracture of seventh cervical vertebra, subsequent encounter for fracture with routine healing: Secondary | ICD-10-CM | POA: Diagnosis not present

## 2014-11-09 DIAGNOSIS — S0031XA Abrasion of nose, initial encounter: Secondary | ICD-10-CM | POA: Diagnosis not present

## 2014-11-09 DIAGNOSIS — R278 Other lack of coordination: Secondary | ICD-10-CM | POA: Diagnosis not present

## 2014-11-09 DIAGNOSIS — R2681 Unsteadiness on feet: Secondary | ICD-10-CM | POA: Diagnosis not present

## 2014-11-09 DIAGNOSIS — K219 Gastro-esophageal reflux disease without esophagitis: Secondary | ICD-10-CM | POA: Diagnosis not present

## 2014-11-09 LAB — COMPREHENSIVE METABOLIC PANEL
ALBUMIN: 2.7 g/dL — AB (ref 3.5–5.2)
ALK PHOS: 35 U/L — AB (ref 39–117)
ALT: 39 U/L (ref 0–53)
AST: 64 U/L — AB (ref 0–37)
Anion gap: 8 (ref 5–15)
BILIRUBIN TOTAL: 1.6 mg/dL — AB (ref 0.3–1.2)
BUN: 23 mg/dL (ref 6–23)
CHLORIDE: 104 mmol/L (ref 96–112)
CO2: 27 mmol/L (ref 19–32)
Calcium: 8.8 mg/dL (ref 8.4–10.5)
Creatinine, Ser: 1.31 mg/dL (ref 0.50–1.35)
GFR calc Af Amer: 57 mL/min — ABNORMAL LOW (ref >90)
GFR calc non Af Amer: 49 mL/min — ABNORMAL LOW (ref >90)
Glucose, Bld: 134 mg/dL — ABNORMAL HIGH (ref 70–99)
POTASSIUM: 4.5 mmol/L (ref 3.5–5.1)
Sodium: 139 mmol/L (ref 135–145)
Total Protein: 5.4 g/dL — ABNORMAL LOW (ref 6.0–8.3)

## 2014-11-09 LAB — CBC
HCT: 30.5 % — ABNORMAL LOW (ref 39.0–52.0)
HEMOGLOBIN: 10.4 g/dL — AB (ref 13.0–17.0)
MCH: 36.2 pg — ABNORMAL HIGH (ref 26.0–34.0)
MCHC: 34.1 g/dL (ref 30.0–36.0)
MCV: 106.3 fL — ABNORMAL HIGH (ref 78.0–100.0)
PLATELETS: 130 10*3/uL — AB (ref 150–400)
RBC: 2.87 MIL/uL — AB (ref 4.22–5.81)
RDW: 14 % (ref 11.5–15.5)
WBC: 8.7 10*3/uL (ref 4.0–10.5)

## 2014-11-09 LAB — GLUCOSE, CAPILLARY: Glucose-Capillary: 134 mg/dL — ABNORMAL HIGH (ref 70–99)

## 2014-11-09 LAB — PROTIME-INR
INR: 2.02 — AB (ref 0.00–1.49)
PROTHROMBIN TIME: 23.1 s — AB (ref 11.6–15.2)

## 2014-11-09 MED ORDER — WARFARIN SODIUM 3 MG PO TABS
3.0000 mg | ORAL_TABLET | Freq: Once | ORAL | Status: DC
  Filled 2014-11-09: qty 1

## 2014-11-09 MED ORDER — POLYETHYLENE GLYCOL 3350 17 G PO PACK
17.0000 g | PACK | Freq: Two times a day (BID) | ORAL | Status: DC
  Administered 2014-11-09: 17 g via ORAL
  Filled 2014-11-09: qty 1

## 2014-11-09 MED ORDER — LEVOFLOXACIN 500 MG PO TABS: 500.0000 mg | ORAL_TABLET | Freq: Every day | ORAL | Status: AC

## 2014-11-09 MED ORDER — GUAIFENESIN-DM 100-10 MG/5ML PO SYRP: 5.0000 mL | ORAL_SOLUTION | ORAL | Status: DC | PRN

## 2014-11-09 MED ORDER — SENNOSIDES-DOCUSATE SODIUM 8.6-50 MG PO TABS
1.0000 | ORAL_TABLET | Freq: Two times a day (BID) | ORAL | Status: DC | PRN
Start: 2014-11-09 — End: 2014-12-15

## 2014-11-09 MED ORDER — GUAIFENESIN ER 600 MG PO TB12: 600.0000 mg | ORAL_TABLET | Freq: Two times a day (BID) | ORAL | Status: DC

## 2014-11-09 MED ORDER — OXYCODONE HCL 5 MG PO TABS: 5.0000 mg | ORAL_TABLET | ORAL | Status: DC | PRN

## 2014-11-09 NOTE — Discharge Summary (Addendum)
Physician Discharge Summary  [name] MRN: [id number] DOB/AGE: [id number] y.o.  PCP: Ria Bush, MD   Admit date: 11/06/2014 Discharge date: 11/09/2014  Discharge Diagnoses:     Active Problems:   EMBOLISM & INFARCTION, IATROGENIC PULMONARY   CKD (chronic kidney disease) stage 3, GFR 30-59 ml/min   HLD (hyperlipidemia)   Dementia   Fall   Abdominal pain, generalized   Syncope    Follow-up recommendations Follow-up INR every 48 hours Follow-up with PCP in 5-7 days Follow-up with neurosurgery as scheduled Regular;Thin liquid ,Seated upright 90 degrees;Upright 30-60 min after meal ,Oral care BID       Medication List    STOP taking these medications        meclizine 25 MG tablet  Commonly known as:  ANTIVERT     VALTREX 1000 MG tablet  Generic drug:  valACYclovir      TAKE these medications        albuterol 108 (90 BASE) MCG/ACT inhaler  Commonly known as:  PROVENTIL HFA;VENTOLIN HFA  Inhale 2 puffs into the lungs every 6 (six) hours as needed for wheezing or shortness of breath.     atorvastatin 40 MG tablet  Commonly known as:  LIPITOR  Take 40 mg by mouth at bedtime.     B-12 PO  Take 1 tablet by mouth every other day.     CoQ-10 100 MG Caps  Take 1 capsule by mouth at bedtime.     DEXILANT 60 MG capsule  Generic drug:  dexlansoprazole  Take 60 mg by mouth daily.     fenofibrate 145 MG tablet  Commonly known as:  TRICOR  TAKE 1 TABLET DAILY     fexofenadine 180 MG tablet  Commonly known as:  ALLEGRA  Take 180 mg by mouth every evening.     gabapentin 100 MG capsule  Commonly known as:  NEURONTIN  Take 100 mg by mouth 3 (three) times daily.     guaiFENesin 600 MG 12 hr tablet  Commonly known as:  MUCINEX  Take 1 tablet (600 mg total) by mouth 2 (two) times daily.     guaiFENesin-dextromethorphan 100-10 MG/5ML syrup  Commonly known as:  ROBITUSSIN DM  Take 5 mLs by mouth every 4 (four) hours as needed for cough.      levofloxacin 500 MG tablet  Commonly known as:  LEVAQUIN  Take 1 tablet (500 mg total) by mouth daily.     MULTIVITAMIN PO  Take by mouth daily.     oxybutynin 5 MG tablet  Commonly known as:  DITROPAN  Take 5 mg by mouth 2 (two) times daily.     oxyCODONE 5 MG immediate release tablet  Commonly known as:  Oxy IR/ROXICODONE  Take 1 tablet (5 mg total) by mouth every 4 (four) hours as needed for severe pain.     senna-docusate 8.6-50 MG per tablet  Commonly known as:  Senokot-S  Take 1 tablet by mouth 2 (two) times daily as needed for mild constipation.     venlafaxine 75 MG tablet  Commonly known as:  EFFEXOR  Take 75 mg by mouth daily.     Vitamin D 2000 UNITS Caps  Take 1 capsule by mouth daily.     warfarin 3 MG tablet  Commonly known as:  COUMADIN  Take 1.5-3 mg by mouth daily. $RemoveBefo'3mg'qIqjvnEmEzn$  daily except 1.$RemoveBefore'5mg'FQrUqexeHNIlX$  on Tuesdays        Discharge Condition: Stable   Disposition: 03-Skilled Nursing  Facility   Consults:  Neurosurgery    Significant Diagnostic Studies: X-ray Chest Pa And Lateral  11/06/2014   CLINICAL DATA:  Syncope, fall yesterday, back pain  EXAM: CHEST  2 VIEW  COMPARISON:  06/02/2014  FINDINGS: Cardiomediastinal silhouette is stable. No acute infiltrate or pleural effusion. No pulmonary edema. Mild degenerative changes thoracic spine.  IMPRESSION: No active cardiopulmonary disease.   Electronically Signed   By: Lahoma Crocker M.D.   On: 11/06/2014 22:42   Dg Thoracic Spine 2 View  11/06/2014   CLINICAL DATA:  Unwitnessed fall  EXAM: THORACIC SPINE - 2 VIEW  COMPARISON:  CT chest dated 06/03/2014  FINDINGS: Normal thoracic kyphosis.  No evidence of fracture or dislocation. Vertebral body heights are maintained.  Moderate multilevel degenerative changes.  Visualized lungs are notable for calcified right hilar nodes.  IMPRESSION: No fracture or dislocation is seen.   Electronically Signed   By: Julian Hy M.D.   On: 11/06/2014 19:10   Dg Lumbar Spine  Complete  11/06/2014   CLINICAL DATA:  Fall about 2 stairs  EXAM: LUMBAR SPINE - COMPLETE 4+ VIEW  COMPARISON:  06/06/2014  FINDINGS: Five views of the lumbar spine submitted. No acute fracture or subluxation. Multilevel anterior osteophytes are noted. There is diffuse osteopenia. Left lateral bridging osteophytes noted at L1-L2, L2-L3 and L3-L4 level. Mild disc space flattening at L5-S1 level. Alignment and vertebral body heights are preserved.  IMPRESSION: No acute fracture or subluxation. Diffuse osteopenia. Degenerative changes as described above.   Electronically Signed   By: Lahoma Crocker M.D.   On: 11/06/2014 19:12   Ct Head Wo Contrast  11/06/2014   CLINICAL DATA:  Pt unwitnessed fall about 2 stairs and striking head on the concrete. Hematoma to head pt on coumadin., unknown LOC. Pain all over  EXAM: CT HEAD WITHOUT CONTRAST  CT CERVICAL SPINE WITHOUT CONTRAST  TECHNIQUE: Multidetector CT imaging of the head and cervical spine was performed following the standard protocol without intravenous contrast. Multiplanar CT image reconstructions of the cervical spine were also generated.  COMPARISON:  CT head cervical spine 03/21/2013.  FINDINGS: CT HEAD FINDINGS  No intracranial hemorrhage. No parenchymal contusion. No midline shift or mass effect. Basilar cisterns are patent. No skull base fracture. No fluid in the paranasal sinuses or mastoid air cells. Orbits are normal.  Generalized cortical atrophy and periventricular white matter hypodensities not changed from prior.  CT CERVICAL SPINE FINDINGS  No prevertebral soft tissue swelling. Normal alignment of cervical vertebral bodies.  There is bulky osteophytosis with bridging osteophytes extending from C2 through C7. There is some separation the osteophyte at C7 from the anterior vertebral body but this not changed comparison exam.  There is mild compression superior inferior endplates is C7 (image 28, sagittal series. There is mild widening of the disc at this  level. Overall loss of to body height is minimal (10%).  No evidence epidural or paraspinal hematoma.  IMPRESSION: 1. No intracranial trauma. 2. Atrophy and microvascular disease unchanged. 3. Acute fracture of the C7 vertebral body with mild compression of the superior and inferior endplates. 4. Bulky osteophytosis from C to through C7 anteriorly. Findings conveyed toWickline MDon 11/06/2014  at20:09.   Electronically Signed   By: Suzy Bouchard M.D.   On: 11/06/2014 20:09   Ct Cervical Spine Wo Contrast  11/06/2014   CLINICAL DATA:  Pt unwitnessed fall about 2 stairs and striking head on the concrete. Hematoma to head pt on coumadin., unknown LOC. Pain  all over  EXAM: CT HEAD WITHOUT CONTRAST  CT CERVICAL SPINE WITHOUT CONTRAST  TECHNIQUE: Multidetector CT imaging of the head and cervical spine was performed following the standard protocol without intravenous contrast. Multiplanar CT image reconstructions of the cervical spine were also generated.  COMPARISON:  CT head cervical spine 03/21/2013.  FINDINGS: CT HEAD FINDINGS  No intracranial hemorrhage. No parenchymal contusion. No midline shift or mass effect. Basilar cisterns are patent. No skull base fracture. No fluid in the paranasal sinuses or mastoid air cells. Orbits are normal.  Generalized cortical atrophy and periventricular white matter hypodensities not changed from prior.  CT CERVICAL SPINE FINDINGS  No prevertebral soft tissue swelling. Normal alignment of cervical vertebral bodies.  There is bulky osteophytosis with bridging osteophytes extending from C2 through C7. There is some separation the osteophyte at C7 from the anterior vertebral body but this not changed comparison exam.  There is mild compression superior inferior endplates is C7 (image 28, sagittal series. There is mild widening of the disc at this level. Overall loss of to body height is minimal (10%).  No evidence epidural or paraspinal hematoma.  IMPRESSION: 1. No intracranial  trauma. 2. Atrophy and microvascular disease unchanged. 3. Acute fracture of the C7 vertebral body with mild compression of the superior and inferior endplates. 4. Bulky osteophytosis from C to through C7 anteriorly. Findings conveyed toWickline MDon 11/06/2014  at20:09.   Electronically Signed   By: Suzy Bouchard M.D.   On: 11/06/2014 20:09   Mri Brain Without Contrast  11/07/2014   CLINICAL DATA:  Initial evaluation for acute trauma, question syncope.  EXAM: MRI HEAD WITHOUT CONTRAST  TECHNIQUE: Multiplanar, multiecho pulse sequences of the brain and surrounding structures were obtained without intravenous contrast.  COMPARISON:  Prior CT from earlier the same day.  FINDINGS: Diffuse prominence of the CSF containing spaces is compatible with generalized cerebral atrophy. Patchy and confluent T2/FLAIR hyperintensity within the periventricular white matter most consistent with chronic small vessel ischemic disease, fairly mild for patient age. Small vessel type changes present within the central pons as well.  No abnormal foci of restricted diffusion to suggest acute intracranial infarct. Gray-white matter differentiation maintained. Normal intravascular flow voids are present.  No mass lesion or midline shift. No mass effect. Ventricular prominence related global parenchymal volume loss present without hydrocephalus. No extra-axial fluid collection. No acute or chronic intracranial hemorrhage.  Craniocervical junction within normal limits. Pituitary gland unremarkable.  No acute abnormality seen about the orbits.  Mild mucoperiosteal thickening present within the ethmoidal air cells. No air-fluid levels within the paranasal sinuses to suggest active infection. No mastoid effusion.  Bone marrow signal intensity normal.  IMPRESSION: 1. No acute intracranial infarct or other abnormality identified. 2. Atrophy with mild chronic small vessel ischemic disease.   Electronically Signed   By: Jeannine Boga M.D.    On: 11/07/2014 00:55   Dg Chest Port 1 View  11/08/2014   CLINICAL DATA:  Acute onset of fever.  Subsequent encounter.  EXAM: PORTABLE CHEST - 1 VIEW  COMPARISON:  Chest radiograph performed 11/06/2014  FINDINGS: Bibasilar airspace opacities are increased from the prior study and concerning for multifocal pneumonia. A small left pleural effusion is noted. No pneumothorax is seen.  The cardiomediastinal silhouette is borderline normal in size. No acute osseous abnormalities are identified.  IMPRESSION: Increasing bibasilar airspace opacities are concerning for multifocal pneumonia. Small left pleural effusion noted.  These results were called by telephone at the time of interpretation on  11/08/2014 at 2:33 am to [facility], who verbally acknowledged these results.   Electronically Signed   By: Garald Balding M.D.   On: 11/08/2014 02:33     Microbiology: Recent Results (from the past 240 hour(s))  Culture, Urine     Status: None   Collection Time: 11/07/14  2:23 PM  Result Value Ref Range Status   Specimen Description URINE, CLEAN CATCH  Final   Special Requests NONE  Final   Colony Count   Final    50,000 COLONIES/ML Performed at [facility]    Culture   Final    Multiple bacterial morphotypes present, none predominant. Suggest appropriate recollection if clinically indicated. Performed at [facility]    Report Status 11/08/2014 FINAL  Final  Culture, blood (routine x 2)     Status: None (Preliminary result)   Collection Time: 11/07/14 11:31 PM  Result Value Ref Range Status   Specimen Description BLOOD RIGHT HAND  Final   Special Requests BOTTLES DRAWN AEROBIC AND ANAEROBIC 10CC EA  Final   Culture   Final           BLOOD CULTURE RECEIVED NO GROWTH TO DATE CULTURE WILL BE HELD FOR 5 DAYS BEFORE ISSUING A FINAL NEGATIVE REPORT Performed at [facility]    Report Status PENDING  Incomplete  Culture, blood (routine x 2)     Status: None (Preliminary result)    Collection Time: 11/07/14 11:37 PM  Result Value Ref Range Status   Specimen Description BLOOD LEFT ANTECUBITAL  Final   Special Requests   Final    BOTTLES DRAWN AEROBIC AND ANAEROBIC 10CC BLUE 4CC PURPLE   Culture   Final           BLOOD CULTURE RECEIVED NO GROWTH TO DATE CULTURE WILL BE HELD FOR 5 DAYS BEFORE ISSUING A FINAL NEGATIVE REPORT Performed at [facility]    Report Status PENDING  Incomplete     Labs: Results for orders placed or performed during the hospital encounter of 11/06/14 (from the past 48 hour(s))  Urinalysis, dipstick only     Status: Abnormal   Collection Time: 11/07/14  2:23 PM  Result Value Ref Range   Specific Gravity, Urine 1.020 1.005 - 1.030   pH 5.5 5.0 - 8.0   Glucose, UA NEGATIVE NEGATIVE mg/dL   Hgb urine dipstick SMALL (A) NEGATIVE   Bilirubin Urine NEGATIVE NEGATIVE   Ketones, ur NEGATIVE NEGATIVE mg/dL   Protein, ur NEGATIVE NEGATIVE mg/dL   Urobilinogen, UA 0.2 0.0 - 1.0 mg/dL   Nitrite NEGATIVE NEGATIVE   Leukocytes, UA NEGATIVE NEGATIVE  Culture, Urine     Status: None   Collection Time: 11/07/14  2:23 PM  Result Value Ref Range   Specimen Description URINE, CLEAN CATCH    Special Requests NONE    Colony Count      50,000 COLONIES/ML Performed at [facility]    Culture      Multiple bacterial morphotypes present, none predominant. Suggest appropriate recollection if clinically indicated. Performed at [facility]    Report Status 11/08/2014 FINAL   CBC WITH DIFFERENTIAL     Status: Abnormal   Collection Time: 11/07/14  3:59 PM  Result Value Ref Range   WBC 7.9 4.0 - 10.5 K/uL   RBC 3.06 (L) 4.22 - 5.81 MIL/uL   Hemoglobin 10.8 (L) 13.0 - 17.0 g/dL   HCT 32.7 (L) 39.0 - 52.0 %   MCV 106.9 (H)  78.0 - 100.0 fL   MCH 35.3 (H) 26.0 - 34.0 pg   MCHC 33.0 30.0 - 36.0 g/dL   RDW 14.1 11.5 - 15.5 %   Platelets 94 (L) 150 - 400 K/uL    Comment: REPEATED TO VERIFY CONSISTENT WITH PREVIOUS RESULT     Neutrophils Relative % 79 (H) 43 - 77 %   Neutro Abs 6.3 1.7 - 7.7 K/uL   Lymphocytes Relative 12 12 - 46 %   Lymphs Abs 0.9 0.7 - 4.0 K/uL   Monocytes Relative 7 3 - 12 %   Monocytes Absolute 0.6 0.1 - 1.0 K/uL   Eosinophils Relative 2 0 - 5 %   Eosinophils Absolute 0.1 0.0 - 0.7 K/uL   Basophils Relative 0 0 - 1 %   Basophils Absolute 0.0 0.0 - 0.1 K/uL  Troponin I (q 6hr x 3)     Status: None   Collection Time: 11/07/14  4:00 PM  Result Value Ref Range   Troponin I <0.03 <0.031 ng/mL    Comment:        NO INDICATION OF MYOCARDIAL INJURY.   CBC WITH DIFFERENTIAL     Status: Abnormal   Collection Time: 11/07/14  8:56 PM  Result Value Ref Range   WBC 7.9 4.0 - 10.5 K/uL   RBC 3.13 (L) 4.22 - 5.81 MIL/uL   Hemoglobin 11.1 (L) 13.0 - 17.0 g/dL   HCT 34.0 (L) 39.0 - 52.0 %   MCV 108.6 (H) 78.0 - 100.0 fL   MCH 35.5 (H) 26.0 - 34.0 pg   MCHC 32.6 30.0 - 36.0 g/dL   RDW 14.3 11.5 - 15.5 %   Platelets 93 (L) 150 - 400 K/uL    Comment: CONSISTENT WITH PREVIOUS RESULT   Neutrophils Relative % 76 43 - 77 %   Lymphocytes Relative 14 12 - 46 %   Monocytes Relative 8 3 - 12 %   Eosinophils Relative 2 0 - 5 %   Basophils Relative 0 0 - 1 %   Neutro Abs 6.0 1.7 - 7.7 K/uL   Lymphs Abs 1.1 0.7 - 4.0 K/uL   Monocytes Absolute 0.6 0.1 - 1.0 K/uL   Eosinophils Absolute 0.2 0.0 - 0.7 K/uL   Basophils Absolute 0.0 0.0 - 0.1 K/uL   WBC Morphology ATYPICAL LYMPHOCYTES   Troponin I (q 6hr x 3)     Status: None   Collection Time: 11/07/14  8:56 PM  Result Value Ref Range   Troponin I <0.03 <0.031 ng/mL    Comment:        NO INDICATION OF MYOCARDIAL INJURY.   Culture, blood (routine x 2)     Status: None (Preliminary result)   Collection Time: 11/07/14 11:31 PM  Result Value Ref Range   Specimen Description BLOOD RIGHT HAND    Special Requests BOTTLES DRAWN AEROBIC AND ANAEROBIC 10CC EA    Culture             BLOOD CULTURE RECEIVED NO GROWTH TO DATE CULTURE WILL BE HELD FOR 5 DAYS  BEFORE ISSUING A FINAL NEGATIVE REPORT Performed at [facility]    Report Status PENDING   Lactic acid, plasma     Status: None   Collection Time: 11/07/14 11:31 PM  Result Value Ref Range   Lactic Acid, Venous 1.0 0.5 - 2.0 mmol/L  Culture, blood (routine x 2)     Status: None (Preliminary result)   Collection Time: 11/07/14 11:37 PM  Result Value  Ref Range   Specimen Description BLOOD LEFT ANTECUBITAL    Special Requests      BOTTLES DRAWN AEROBIC AND ANAEROBIC 10CC BLUE 4CC PURPLE   Culture             BLOOD CULTURE RECEIVED NO GROWTH TO DATE CULTURE WILL BE HELD FOR 5 DAYS BEFORE ISSUING A FINAL NEGATIVE REPORT Performed at [facility]    Report Status PENDING   Troponin I (q 6hr x 3)     Status: None   Collection Time: 11/08/14  4:05 AM  Result Value Ref Range   Troponin I 0.03 <0.031 ng/mL    Comment:        NO INDICATION OF MYOCARDIAL INJURY.   Comprehensive metabolic panel     Status: Abnormal   Collection Time: 11/08/14  4:05 AM  Result Value Ref Range   Sodium 138 135 - 145 mmol/L   Potassium 4.4 3.5 - 5.1 mmol/L   Chloride 108 96 - 112 mmol/L   CO2 25 19 - 32 mmol/L   Glucose, Bld 124 (H) 70 - 99 mg/dL   BUN 25 (H) 6 - 23 mg/dL   Creatinine, Ser 1.62 (H) 0.50 - 1.35 mg/dL   Calcium 8.7 8.4 - 10.5 mg/dL   Total Protein 5.6 (L) 6.0 - 8.3 g/dL   Albumin 3.0 (L) 3.5 - 5.2 g/dL   AST 68 (H) 0 - 37 U/L   ALT 48 0 - 53 U/L   Alkaline Phosphatase 34 (L) 39 - 117 U/L   Total Bilirubin 1.3 (H) 0.3 - 1.2 mg/dL   GFR calc non Af Amer 38 (L) >90 mL/min   GFR calc Af Amer 44 (L) >90 mL/min    Comment: (NOTE) The eGFR has been calculated using the CKD EPI equation. This calculation has not been validated in all clinical situations. eGFR's persistently <90 mL/min signify possible Chronic Kidney Disease.    Anion gap 5 5 - 15  Protime-INR     Status: Abnormal   Collection Time: 11/08/14  4:05 AM  Result Value Ref Range   Prothrombin Time 25.1 (H)  11.6 - 15.2 seconds   INR 2.25 (H) 0.00 - 1.49  Urinalysis, Routine w reflex microscopic     Status: Abnormal   Collection Time: 11/08/14  7:10 PM  Result Value Ref Range   Color, Urine YELLOW YELLOW   APPearance CLEAR CLEAR   Specific Gravity, Urine 1.017 1.005 - 1.030   pH 6.0 5.0 - 8.0   Glucose, UA NEGATIVE NEGATIVE mg/dL   Hgb urine dipstick MODERATE (A) NEGATIVE   Bilirubin Urine NEGATIVE NEGATIVE   Ketones, ur NEGATIVE NEGATIVE mg/dL   Protein, ur NEGATIVE NEGATIVE mg/dL   Urobilinogen, UA 0.2 0.0 - 1.0 mg/dL   Nitrite NEGATIVE NEGATIVE   Leukocytes, UA NEGATIVE NEGATIVE  Urine microscopic-add on     Status: None   Collection Time: 11/08/14  7:10 PM  Result Value Ref Range   Squamous Epithelial / LPF RARE RARE   RBC / HPF 11-20 <3 RBC/hpf   Bacteria, UA RARE RARE  Comprehensive metabolic panel     Status: Abnormal   Collection Time: 11/09/14  5:37 AM  Result Value Ref Range   Sodium 139 135 - 145 mmol/L   Potassium 4.5 3.5 - 5.1 mmol/L   Chloride 104 96 - 112 mmol/L   CO2 27 19 - 32 mmol/L   Glucose, Bld 134 (H) 70 - 99 mg/dL   BUN 23 6 -  23 mg/dL   Creatinine, Ser 1.31 0.50 - 1.35 mg/dL   Calcium 8.8 8.4 - 10.5 mg/dL   Total Protein 5.4 (L) 6.0 - 8.3 g/dL   Albumin 2.7 (L) 3.5 - 5.2 g/dL   AST 64 (H) 0 - 37 U/L   ALT 39 0 - 53 U/L   Alkaline Phosphatase 35 (L) 39 - 117 U/L   Total Bilirubin 1.6 (H) 0.3 - 1.2 mg/dL   GFR calc non Af Amer 49 (L) >90 mL/min   GFR calc Af Amer 57 (L) >90 mL/min    Comment: (NOTE) The eGFR has been calculated using the CKD EPI equation. This calculation has not been validated in all clinical situations. eGFR's persistently <90 mL/min signify possible Chronic Kidney Disease.    Anion gap 8 5 - 15  Protime-INR     Status: Abnormal   Collection Time: 11/09/14  5:37 AM  Result Value Ref Range   Prothrombin Time 23.1 (H) 11.6 - 15.2 seconds   INR 2.02 (H) 0.00 - 1.49  CBC     Status: Abnormal   Collection Time: 11/09/14  5:37 AM   Result Value Ref Range   WBC 8.7 4.0 - 10.5 K/uL    Comment: WHITE COUNT CONFIRMED ON SMEAR   RBC 2.87 (L) 4.22 - 5.81 MIL/uL   Hemoglobin 10.4 (L) 13.0 - 17.0 g/dL   HCT 30.5 (L) 39.0 - 52.0 %   MCV 106.3 (H) 78.0 - 100.0 fL   MCH 36.2 (H) 26.0 - 34.0 pg   MCHC 34.1 30.0 - 36.0 g/dL   RDW 14.0 11.5 - 15.5 %   Platelets 130 (L) 150 - 400 K/uL    Comment: REPEATED TO VERIFY DELTA CHECK NOTED   Glucose, capillary     Status: Abnormal   Collection Time: 11/09/14  7:04 AM  Result Value Ref Range   Glucose-Capillary 134 (H) 70 - 99 mg/dL   Comment 1 Notify RN    Comment 2 Documented in Char      HPI  81 year old male with a history of dementia, CAD, stage III C KD, history of venous thromboembolism on Coumadin who came to the ED after he had an unwitnessed fall. Patient was found to have C7 vertebral body fracture with mild compression on CT cervical spine. ED resident Discussed with neurosurgeon on call who recommended hard collar and follow-up in the clinic. Patient was admitted to [facility] hospitalist service for further workup of possible syncope  HOSPITAL COURSE:   Assessment/Plan:  ?Syncope- patient had unwitnessed fall at home, workup for syncope negative . MRI brain showed no acute intracranial infarct. 2-D echo has been done which shows EF 65-70%, with grade 1 diastolic dysfunction. Telemetry is showing normal sinus rhythm. Negative serial cardiac enzymes . Patient will need outpatient Holter monitoring. I have contacted cardiology to notify them today.   HCAP-chest x-ray showed multifocal pneumonia, blood cultures 2 drawn, no growth so far, started on imipenem 2 days. No fever since antibiotic was started. Switch to levofloxacin by mouth for 7 days. Speech therapy recommendations as above. Trauma services suspects rib fracture recommends aggressive pulmonary toilet. Continue incentive spirometry  C7 cervical spine fracture- CT cervical spine showed acute fracture of C7  vertebral body with mild compression of the superior and inferior endplate. Neurosurgery recommended hard collar and follow-up in the clinic in 2 weeks for repeat x-rays.   Status post fall- lumbar spine x-ray showed no acute abnormality, thoracic spine x-ray showed no acute ability.  Acute on C KD- patient has C KD stage III, presenting with elevated BUN/creatinine 29/1.79, creatinine slowly improving.Marland Kitchen 1.315 to discharge. Repeat BMP in one week  History of venous thrombus embolism- patient is on warfarin, which has been continued during this admission. Patient's wife states that the patient is at high risk of PE and DVT and would like to continue with this despite history of recurrent falls. No intracranial bleeding per CT head. Continue to monitor INR every 48 hours.  Hyperlipidemia- continue fenofibrate, Lipitor  Depression- continue Effexor 75 mg by mouth daily. Patient likely to have sundowning in the evening.  Code Status: Full code Family Communication: Discussed with patient's wife at bedside    Discharge Exam:    Blood pressure 130/54, pulse 78, temperature 98.3 F (36.8 C), temperature source Oral, resp. rate 20, height $RemoveBe'5\' 8"'cLuSxDujj$  (1.727 m), weight 76.9 kg (169 lb 8.5 oz), SpO2 98 %.   General: Appears in no acute distress, neck collar in place  Cardiovascular: S1-S2 is regular  Respiratory: Clear to auscultation bilaterally  Abdomen: Soft nontender no organomegaly  Musculoskeletal: *No edema noted of the lower extremities  Neurology In a cervical collar, otherwise no focal deficits       Discharge Instructions    Diet - low sodium heart healthy    Complete by:  As directed      Increase activity slowly    Complete by:  As directed            Follow-up Information    Follow up with POOL,HENRY A, MD. Schedule an appointment as soon as possible for a visit in 2 weeks.   Specialty:  Neurosurgery   Contact information:   [address] [place]  [address] [place] [address] [phone]       Follow up with Ria Bush, MD. Schedule an appointment as soon as possible for a visit in 1 week.   Specialty:  Family Medicine   Contact information:   [place] [address] [phone]       Signed: Reyne Dumas 11/09/2014, 10:41 AM

## 2014-11-09 NOTE — Progress Notes (Signed)
[place] for coumadin Indication: hx  pulmonary embolus  Allergies  Allergen Reactions  . Lactose Intolerance (Gi)   . Nizatidine     REACTION: Rash (Axid)  . Penicillins     REACTION: Rash  . Sulfadiazine     REACTION: Hives    Patient Measurements: Height: 5\' 8"  (172.7 cm) Weight: 169 lb 8.5 oz (76.9 kg) IBW/kg (Calculated) : 68.4   Vital Signs: Temp: 98.3 F (36.8 C) (03/03 1025) Temp Source: Oral (03/03 1025) BP: 130/54 mmHg (03/03 1025) Pulse Rate: 78 (03/03 1025)  Labs:  Recent Labs  11/07/14 0454  11/07/14 1559 11/07/14 1600 11/07/14 2056 11/08/14 0405 11/09/14 0537  HGB 11.3*  < > 10.8*  --  11.1*  --  10.4*  HCT 33.9*  < > 32.7*  --  34.0*  --  30.5*  PLT 102*  < > 94*  --  93*  --  130*  LABPROT 24.0*  --   --   --   --  25.1* 23.1*  INR 2.13*  --   --   --   --  2.25* 2.02*  CREATININE 1.79*  --   --   --   --  1.62* 1.31  TROPONINI  --   --   --  <0.03 <0.03 0.03  --   < > = values in this interval not displayed.  Estimated Creatinine Clearance: 42.8 mL/min (by C-G formula based on Cr of 1.31).  Assessment: Patent is an 81 y.o M on coumadin PTA for hx PE.  He was admitted on 3/29 s/p unwitnessed fall and found to have C7 compression fracture.  INR remains at goal today at 2.  No bleeding documented. Will continue home regimen.  PTA dose: 1.5 mg qTues, 3 mg po all other days   Goal of Therapy:  INR 2-3    Plan:  - warfarin 3 mg po x1 - daily INR - monitor for s/sx of bleeding    Hughes Better, PharmD, BCPS Clinical Pharmacist Pager: [phone] 11/09/2014 1:19 PM

## 2014-11-09 NOTE — Clinical Social Work Psychosocial (Signed)
Clinical Social Work Department BRIEF PSYCHOSOCIAL ASSESSMENT 11/09/2014  Patient:  [name], [name]     Account Number:  [id number]     Admit date:  11/06/2014  Clinical Social Worker:  Marciano Sequin  Date/Time:  11/09/2014 12:48 PM  Referred by:  RN  Date Referred:  11/09/2014 Referred for  SNF Placement   Other Referral:   Interview type:  Patient Other interview type:    PSYCHOSOCIAL DATA Living Status:  WIFE Admitted from facility:   Level of care:   Primary support name:  [name],[name] Primary support relationship to patient:  SPOUSE Degree of support available:   Strong Support    CURRENT CONCERNS Current Concerns  None Noted   Other Concerns:    SOCIAL WORK ASSESSMENT / PLAN CSW met the pt and pt's wife [name] at the bedside.  CSW introduced self and purpose of the visit. CSW discussed clinical recommendation for SNF rehab. CSW inquired about the geographical location in which the pt would like to receive rehab from. Both the pt and [name] reported that they wish to remain in [place]. CSW explained the SNF rehab process to the pt. CSW and pt discussed insurance and its relation to SNF rehab. CSW answered all questions in which the pt inquired about. CSW provided pt with contact information for further questions. CSW will continue to follow this pt and assist with discharge as needed.   Assessment/plan status:  Psychosocial Support/Ongoing Assessment of Needs Other assessment/ plan:   Information/referral to community resources:    PATIENT'S/FAMILY'S RESPONSE TO CURRENT DIAGNOSE: Pt's wife expressing feelings of frustration because the pt independence will be limited, but stated being thankful the pt is still alive.    PATIENT'S/FAMILY'S RESPONSE TO PLAN OF CARE: Pt and pt's wife agreeable to receive rehab at a SNF.    [place], MSW, [place]

## 2014-11-09 NOTE — Progress Notes (Signed)
Speech Language Pathology Treatment: Dysphagia  Patient Details Name: [name] MRN: [id number] DOB: 09-20-1930 Today's Date: 11/09/2014 Time: 0017-4944 SLP Time Calculation (min) (ACUTE ONLY): 20 min  Assessment / Plan / Recommendation Clinical Impression  Skilled treatment session focused on addressing dysphagia goals and wrap up of education.  SLP facilitated session with set-up of regular textures and thin liquids via straw.  Patient was able to self-feed cracker with Min verbal cues to alternate between solids and liquids to assist with suspected esophageal issues.  Patient continues to demonstrate an audible swallow intermittent delayed throat clears with thin liquid straw sips.  Wife reports that sitting up, small portions and crushing medications have been effective compensatory strategies.  SLP recommended adding alternating solids and liquids and wife was agreeable.  Patient and wife managing current diet with use of compensatory strategies and education has been completed. No further skilled SLP services are warranted at this time; SLP signing off.     HPI HPI: 81 y.o. year old male with significant past medical history of dementia, CAD, stage 3 CKD, hx/o VTE on coumadin, osteoporosis, essential tremor presenting with unwitnessed fall. Pt has had 3 esophageal dilations per wife. MBS completed 02/05/2012 recommended regular diet and thin liquids with intermittent penetration of thin liquids that appeared to reflexively clear with additional swallows and throat clearing. Pt and wife deny any h/o PNA.   Pertinent Vitals Pain Assessment: Faces Faces Pain Scale: Hurts even more Pain Location: pain all over backside with bed movement Pain Descriptors / Indicators: Aching Pain Intervention(s): Monitored during session;Limited activity within patient's tolerance  SLP Plan  All goals met;Discharge SLP treatment due to (comment) (education complete)    Recommendations Diet recommendations:  Regular;Thin liquid Liquids provided via: Straw Medication Administration: Crushed with puree Supervision: Patient able to self feed;Intermittent supervision to cue for compensatory strategies;Trained caregiver to feed patient Compensations: Slow rate;Small sips/bites;Follow solids with liquid Postural Changes and/or Swallow Maneuvers: Seated upright 90 degrees;Upright 30-60 min after meal              Oral Care Recommendations: Oral care BID Follow up Recommendations: None Plan: All goals met;Discharge SLP treatment due to (comment) (education complete)    GO    Carmelia Roller., CCC-SLP [phone]  [place] 11/09/2014, 10:29 AM

## 2014-11-09 NOTE — Progress Notes (Signed)
Patient is discharged from room 4N28 at this time. Alert and in stable condition. Report called in to receiving nurse Nicholes Rough at [facility]. IV site d/c'd as well as tele. Transported by [facility] via stretcher with wife and all belongings at side.

## 2014-11-09 NOTE — Progress Notes (Signed)
[facility]  Progress Note   LOS: 3 days   Subjective: Pt says abdominal pain improved after suppository.  No N/V, tolerating solid diet, but appetite poor.  Mobilizing with therapies.  Wife at bedside.    Objective: Vital signs in last 24 hours: Temp:  [97.9 F (36.6 C)-99.1 F (37.3 C)] 98.5 F (36.9 C) (03/03 0619) Pulse Rate:  [77-89] 89 (03/03 0619) Resp:  [18-20] 20 (03/03 0619) BP: (107-151)/(44-73) 107/44 mmHg (03/03 0619) SpO2:  [94 %-98 %] 97 % (03/03 0619) Last BM Date: 11/06/14  Lab Results:  CBC  Recent Labs  11/07/14 2056 11/09/14 0537  WBC 7.9 PENDING  HGB 11.1* 10.4*  HCT 34.0* 30.5*  PLT 93* 130*   BMET  Recent Labs  11/08/14 0405 11/09/14 0537  NA 138 139  K 4.4 4.5  CL 108 104  CO2 25 27  GLUCOSE 124* 134*  BUN 25* 23  CREATININE 1.62* 1.31  CALCIUM 8.7 8.8    Imaging: Dg Chest Port 1 View  11/08/2014   CLINICAL DATA:  Acute onset of fever.  Subsequent encounter.  EXAM: PORTABLE CHEST - 1 VIEW  COMPARISON:  Chest radiograph performed 11/06/2014  FINDINGS: Bibasilar airspace opacities are increased from the prior study and concerning for multifocal pneumonia. A small left pleural effusion is noted. No pneumothorax is seen.  The cardiomediastinal silhouette is borderline normal in size. No acute osseous abnormalities are identified.  IMPRESSION: Increasing bibasilar airspace opacities are concerning for multifocal pneumonia. Small left pleural effusion noted.  These results were called by telephone at the time of interpretation on 11/08/2014 at 2:33 am to [facility], who verbally acknowledged these results.   Electronically Signed   By: Garald Balding M.D.   On: 11/08/2014 02:33     PE: General: pleasant, WD/WN white male who is laying in bed in NAD HEENT: head is normocephalic, atraumatic.  Sclera are noninjected/nonicteric.  PERRL.  Ears and nose without any masses or lesions.  Mouth is pink and moist.  C-collar  on. Heart: regular, rate, and rhythm.  Normal s1,s2. No obvious murmurs, gallops, or rubs noted.  Palpable radial and pedal pulses bilaterally Lungs: CTAB, no wheezes, rhonchi, or rales noted.  Respiratory effort nonlabored Abd: soft, mild distension, mild tenderness throughout, +BS, no masses, hernias, or organomegaly MS: all 4 extremities are symmetrical with no cyanosis, clubbing, or edema. Skin: warm and dry with no masses, lesions, or rashes Psych: A&Ox3 with an appropriate affect.   Assessment/Plan: Fall C7 fx -- C-collar, per neurosurgery Abdominal pain -- Could be MS related, but also concern for liver lac.  Pain improved after large BM after suppository yesterday.   May have occult rib fxs.  Aggressive pulmonary toilet.  Add tramadol for pain control. Hyperbilirubinemia and mild transaminitis - a concern, will discuss with Dr. Grandville Silos, consider ultrasound or MR? PNA -- per primary service Multiple medical problems -- per primary service Disp - therapies recommending SNF    Coralie Keens, [place] Pager: [phone] General Trauma PA Pager: [phone]   11/09/2014

## 2014-11-09 NOTE — Clinical Social Work Placement (Signed)
Clinical Social Work Department CLINICAL SOCIAL WORK PLACEMENT NOTE 11/09/2014  Patient:  RODARIUS, KICHLINE  Account Number:  [id number] Admit date:  11/06/2014  Clinical Social Worker:  Greta Doom, LCSWA  Date/time:  11/09/2014 12:56 PM  Clinical Social Work is seeking post-discharge placement for this patient at the following level of care:   SKILLED NURSING   (*CSW will update this form in Epic as items are completed)   11/09/2014  Patient/family provided with [place] Department of Clinical Social Work's list of facilities offering this level of care within the geographic area requested by the patient (or if unable, by the patient's family).  11/09/2014  Patient/family informed of their freedom to choose among providers that offer the needed level of care, that participate in Medicare, Medicaid or managed care program needed by the patient, have an available bed and are willing to accept the patient.  11/09/2014  Patient/family informed of [facility]' ownership interest in [facility], as well as of the fact that they are under no obligation to receive care at this facility.  PASARR submitted to EDS on  PASARR number received on   FL2 transmitted to all facilities in geographic area requested by pt/family on  11/09/2014 FL2 transmitted to all facilities within larger geographic area on 11/09/2014  Patient informed that his/her managed care company has contracts with or will negotiate with  certain facilities, including the following:     Patient/family informed of bed offers received:  11/09/2014 Patient chooses bed at  Physician recommends and patient chooses bed at    Patient to be transferred to  on  11/09/2014 Patient to be transferred to facility by [facility] Patient and family notified of transfer on 11/09/2014 Name of family member notified:  Dorita Sciara  The following physician request were entered in Epic:   Additional Comments:  Pt has existing  [place], MSW, [place]

## 2014-11-10 ENCOUNTER — Encounter: Payer: Self-pay | Admitting: Registered Nurse

## 2014-11-10 ENCOUNTER — Non-Acute Institutional Stay (SKILLED_NURSING_FACILITY): Payer: Medicare Other | Admitting: Registered Nurse

## 2014-11-10 DIAGNOSIS — F329 Major depressive disorder, single episode, unspecified: Secondary | ICD-10-CM

## 2014-11-10 DIAGNOSIS — R55 Syncope and collapse: Secondary | ICD-10-CM

## 2014-11-10 DIAGNOSIS — G250 Essential tremor: Secondary | ICD-10-CM | POA: Diagnosis not present

## 2014-11-10 DIAGNOSIS — E538 Deficiency of other specified B group vitamins: Secondary | ICD-10-CM | POA: Diagnosis not present

## 2014-11-10 DIAGNOSIS — N183 Chronic kidney disease, stage 3 unspecified: Secondary | ICD-10-CM

## 2014-11-10 DIAGNOSIS — F0390 Unspecified dementia without behavioral disturbance: Secondary | ICD-10-CM | POA: Diagnosis not present

## 2014-11-10 DIAGNOSIS — K219 Gastro-esophageal reflux disease without esophagitis: Secondary | ICD-10-CM

## 2014-11-10 DIAGNOSIS — S12600S Unspecified displaced fracture of seventh cervical vertebra, sequela: Secondary | ICD-10-CM

## 2014-11-10 DIAGNOSIS — IMO0001 Reserved for inherently not codable concepts without codable children: Secondary | ICD-10-CM

## 2014-11-10 DIAGNOSIS — I2699 Other pulmonary embolism without acute cor pulmonale: Secondary | ICD-10-CM

## 2014-11-10 DIAGNOSIS — F32A Depression, unspecified: Secondary | ICD-10-CM

## 2014-11-10 DIAGNOSIS — M6281 Muscle weakness (generalized): Secondary | ICD-10-CM | POA: Diagnosis not present

## 2014-11-10 DIAGNOSIS — J189 Pneumonia, unspecified organism: Secondary | ICD-10-CM

## 2014-11-10 DIAGNOSIS — E785 Hyperlipidemia, unspecified: Secondary | ICD-10-CM | POA: Diagnosis not present

## 2014-11-10 DIAGNOSIS — R030 Elevated blood-pressure reading, without diagnosis of hypertension: Secondary | ICD-10-CM

## 2014-11-10 DIAGNOSIS — T81718A Complication of other artery following a procedure, not elsewhere classified, initial encounter: Secondary | ICD-10-CM

## 2014-11-10 LAB — POCT INR: INR: 2.2 — AB (ref 0.9–1.1)

## 2014-11-10 NOTE — Progress Notes (Signed)
Patient ID: [name], male   DOB: 1930/11/22, 81 y.o.   MRN: [id number]   Place of Service: [facility]  Allergies  Allergen Reactions  . Lactose Intolerance (Gi)   . Nizatidine     REACTION: Rash (Axid)  . Penicillins     REACTION: Rash  . Sulfadiazine     REACTION: Hives    Code Status: Full Code  Goals of Care: Longevity/STR  Chief Complaint  Patient presents with  . Hospitalization Follow-up    HPI 81 y.o. male with PMH of CAD, PE on chronic oral anticoagulation with coumadin, deprssion, HLD, dementia, remote asthma among others is being seen for a follow-up visit post hospital admission from 11/06/14 to 11/09/14 for C7 vertebral fracture s/p unwitnessed fall, questionable syncope, and HCAP. He his here for short-term rehab and his goal is to return home. Seen in room today. Denies any complaints. Spouse, [name] who is a retired FNP is at bedside. Stated that patient was about to start taricept but he ended up in the hospital. She would like for him to be on Aricept if possible.   Review of Systems Constitutional: Negative for fever and chills HENT: Negative for ear pain and sore throat Eyes: Negative for eye pain and eye discharge Cardiovascular: Negative for chest pain, palpitations. Positive for leg swelling Respiratory: Negative cough, shortness of breath, and wheezing.  Gastrointestinal: Negative for nausea and vomiting. Negative for abdominal pain, diarrhea and constipation.  Genitourinary: Negative for dysuria Musculoskeletal: Positive for back pain  Neurological: Negative for dizziness and headache. Positive for tremors  Skin: Negative for rash Psychiatric: Negative for depression  Past Medical History  Diagnosis Date  . GERD (gastroesophageal reflux disease)     h/o PUD  . CAD (coronary artery disease)     cath 2000 30% single vessel, normal nuclear stress test 12/03/2010, no evidence ischemia  . CKD (chronic kidney disease) stage 3, GFR 30-59  ml/min     baseline Cr 1.7  . Depression   . Hx pulmonary embolism 08/1991    coumadin  . History of phlebitis   . Allergic rhinitis   . Urge incontinence   . Elevated PSA     previous-normalized (followed by Dr. Jeffie Pollock, rec no repeat unless urinary sxs)  . HLD (hyperlipidemia)     hypertriglyceridemia  . Asthma     remote  . Arthritis   . Blood transfusion 1990's  . Schatzki's ring 02/2012    s/p dilation [name]  . History of fracture of right hip   . Hearing loss 06/2012    eval - rec annual exam  . Essential tremor 09/27/2008  . Prediabetes   . DISH (diffuse idiopathic skeletal hyperostosis) 03/2013    lumbar spine on xray  . DDD (degenerative disc disease) 03/2013    by CT, diffuse multilevel cervical and lumbar spondylosis  . Osteoporosis 11/2013    DEXA T score -2.7  . Macrocytic anemia     stable B12/folate and periph smear 05/2013  . Sleep apnea     no CPAP- sleep apnea "cleared up 15 years ago"  . Dizziness     multifactorial, s/p PT at [facility] 06/2014 with HEP  . History of pyelonephritis 05/2014    hospitalization with sepsis    Past Surgical History  Procedure Laterality Date  . Tonsillectomy  1965  . Hernia repair  1979    Left  . Cystoscopy  1992    for kidney stones  . Exploratory  laparotomy  1996    with incidental appendectomy  . Appendectomy  1996  . Cardiac catheterization  2000    30% one vessel  . Knee arthroscopy  03/24/02    Right (Dr. Mauri Pole)  . Orif femoral neck fracture w/ dhs  08/03/02    Right (Dr. Mauri Pole)  . Ct abd w & pelvis wo cm  07/2001    Scarring of right lung, stable negative o/w  . Ct abd w & pelvis wo cm  11/2000    ? stones, right LL scarring  . V/q scan  06/1999    negative  . Colonoscopy  1998    [place]  . US echocardiography  12/28/2007    Mild aortic valve calcification EF 55%, basically nml  . Carotid u/s  12/28/2007    nml  . Eeg  11/12/2009    nml  . Mri  11/2009    Head, nml  . Cataract extraction  Jan,  March 2011    bilateral  . Colonoscopy  07/2010    5 polyps, adenomatous, rec rpt 3 yrs  . Esophagogastroduodenoscopy  02/2012    dilation of schatzki's ring [name]  . Upper gastrointestinal endoscopy    . Colonoscopy  04/2014    3 polyps, adenomatous, f/u open ended given age [name]    History  Substance Use Topics  . Smoking status: Never Smoker   . Smokeless tobacco: Never Used  . Alcohol Use: No    Family History  Problem Relation Age of Onset  . Stroke Mother   . Hypertension Mother   . Cancer Brother     prostate with mets  . Blindness Brother     legally  . Diabetes Brother   . Pulmonary embolism Sister     from shoulder operation  . Alcohol abuse Brother   . Colon cancer Neg Hx   . Esophageal cancer Neg Hx   . Rectal cancer Neg Hx   . Stomach cancer Neg Hx       Medication List       This list is accurate as of: 11/10/14  4:34 PM.  Always use your most recent med list.               albuterol 108 (90 BASE) MCG/ACT inhaler  Commonly known as:  PROVENTIL HFA;VENTOLIN HFA  Inhale 2 puffs into the lungs every 6 (six) hours as needed for wheezing or shortness of breath.     atorvastatin 40 MG tablet  Commonly known as:  LIPITOR  Take 40 mg by mouth at bedtime.     B-12 PO  Take 10,000 mcg by mouth every other day.     CoQ-10 100 MG Caps  Take 1 capsule by mouth at bedtime.     DEXILANT 60 MG capsule  Generic drug:  dexlansoprazole  Take 60 mg by mouth daily.     donepezil 5 MG tablet  Commonly known as:  ARICEPT  Take 5 mg by mouth at bedtime.     fenofibrate 145 MG tablet  Commonly known as:  TRICOR  TAKE 1 TABLET DAILY     fexofenadine 180 MG tablet  Commonly known as:  ALLEGRA  Take 180 mg by mouth every evening.     gabapentin 100 MG capsule  Commonly known as:  NEURONTIN  Take 100 mg by mouth 3 (three) times daily.     guaiFENesin 600 MG 12 hr tablet  Commonly known as:  MUCINEX  Take 1  tablet (600 mg total) by mouth 2 (two)  times daily.     guaiFENesin-dextromethorphan 100-10 MG/5ML syrup  Commonly known as:  ROBITUSSIN DM  Take 5 mLs by mouth every 4 (four) hours as needed for cough.     levofloxacin 500 MG tablet  Commonly known as:  LEVAQUIN  Take 1 tablet (500 mg total) by mouth daily.     MULTIVITAMIN PO  Take by mouth daily.     oxybutynin 5 MG tablet  Commonly known as:  DITROPAN  Take 5 mg by mouth 2 (two) times daily.     oxyCODONE 5 MG immediate release tablet  Commonly known as:  Oxy IR/ROXICODONE  Take 1 tablet (5 mg total) by mouth every 4 (four) hours as needed for severe pain.     senna-docusate 8.6-50 MG per tablet  Commonly known as:  Senokot-S  Take 1 tablet by mouth 2 (two) times daily as needed for mild constipation.     venlafaxine 75 MG tablet  Commonly known as:  EFFEXOR  Take 75 mg by mouth daily.     Vitamin D 2000 UNITS Caps  Take 1 capsule by mouth daily.     warfarin 3 MG tablet  Commonly known as:  COUMADIN  Take 1.5-3 mg by mouth daily. 3mg  daily except 1.5mg  on Tuesdays        Physical Exam  BP 151/90 mmHg  Pulse 68  Temp(Src) 97.6 F (36.4 C)  Resp 20  Ht 5\' 8"  (1.727 m)  Wt 167 lb 14.4 oz (76.159 kg)  BMI 25.54 kg/m2  SpO2 95%  Constitutional: Frail elderly male in no acute distress. Conversant and pleasantly confused at times. Bil hand tremors noted.  HEENT: Normocephalic and atraumatic. PERRL. EOM intact. No scleral icterus. No nasal discharge or sinus tenderness. Oral mucosa moist. Posterior pharynx clear of any exudate or lesions.  Neck: Hard C-collar in place Cardiac: Normal S1, S2. RRR without appreciable murmurs, rubs, or gallops. Distal pulses intact. Trace dependent edema.  Lungs: No respiratory distress. Breath sounds coarse bilaterally without rales, rhonchi, or wheezes. Abdomen: Audible bowel sounds in all quadrants. Soft, nontender, nondistended.  Musculoskeletal: able to move all extremities. Generalized weakness present.  Skin:  Warm and dry. No rash noted. No erythema.  Neurological: Alert and oriented to self Psychiatric:  Appropriate mood and affect.   Labs Reviewed  CBC Latest Ref Rng 11/09/2014 11/07/2014 11/07/2014  WBC 4.0 - 10.5 K/uL 8.7 7.9 7.9  Hemoglobin 13.0 - 17.0 g/dL 10.4(L) 11.1(L) 10.8(L)  Hematocrit 39.0 - 52.0 % 30.5(L) 34.0(L) 32.7(L)  Platelets 150 - 400 K/uL 130(L) 93(L) 94(L)    CMP Latest Ref Rng 11/09/2014 11/08/2014 11/07/2014  Glucose 70 - 99 mg/dL 134(H) 124(H) 166(H)  BUN 6 - 23 mg/dL 23 25(H) 29(H)  Creatinine 0.50 - 1.35 mg/dL 1.31 1.62(H) 1.79(H)  Sodium 135 - 145 mmol/L 139 138 143  Potassium 3.5 - 5.1 mmol/L 4.5 4.4 4.8  Chloride 96 - 112 mmol/L 104 108 106  CO2 19 - 32 mmol/L 27 25 29   Calcium 8.4 - 10.5 mg/dL 8.8 8.7 9.5  Total Protein 6.0 - 8.3 g/dL 5.4(L) 5.6(L) 5.3(L)  Total Bilirubin 0.3 - 1.2 mg/dL 1.6(H) 1.3(H) 1.0  Alkaline Phos 39 - 117 U/L 35(L) 34(L) 29(L)  AST 0 - 37 U/L 64(H) 68(H) 84(H)  ALT 0 - 53 U/L 39 48 54(H)    Lipid Panel     Component Value Date/Time   CHOL 139 10/30/2014 0940  TRIG 183.0* 10/30/2014 0940   HDL 25.50* 10/30/2014 0940   CHOLHDL 5 10/30/2014 0940   VLDL 36.6 10/30/2014 0940   LDLCALC 77 10/30/2014 0940   LDLDIRECT 82.6 11/26/2009 0922   Diagnostic Studies Reviewed 11/07/14: 2D Echo -Left ventricle: The cavity size was normal. Wall thickness was normal. Systolic function was vigorous. The estimated ejectionfraction was in the range of 65% to 70%. Wall motion was normal; there were no regional wall motion abnormalities. Doppler parameters are consistent with abnormal left ventricular relaxation (grade 1 diastolic dysfunction). The E/e&' ratio isbetween 8-15, suggesting indeterminate LV filling pressure. - Aortic valve: Trileaflet. Sclerosis without stenosis. There was trivial to mild regurgitation. - Mitral valve: Calcified annulus. - Left atrium: The atrium was normal in size.  11/07/14: CXR Increasing bibasilar airspace opacities are  concerning for multifocal pneumonia. Small left pleural effusion noted.  11/06/14: CT cervical spine 1. No intracranial trauma. 2. Atrophy and microvascular disease unchanged. 3. Acute fracture of the C7 vertebral body with mild compression of the superior and inferior endplates. 4. Bulky osteophytosis from C to through C7 anteriorly.  Assessment & Plan 1. EMBOLISM & INFARCTION, IATROGENIC PULMONARY On chronic oral anticoagulation with coumadin. INR 2.2 today. Continue coumadin 3mg  daily except 1.5mg  on Tuesdays. Next INR check 11/13/14.   2. Syncope, unspecified syncope type Has unwitnessed fall. Questionable syncope. EF 65-70% with grade 1 diastolic dysfunction on recent 2D Echo.Negative serial cardiac enzymes. Outpatient Holter monitoring was recommended to rule out cardiac arrhythmias. Cardiology consult initiated. Wife stated that the fall is most likely due unsteady's gait. Continue to monitor his status for now  3. Vitamin B12 deficiency Continue vitamin B12 supplement   4. Essential tremor Continue gabapentin 100mg  three times daily and monitor.   5. Dementia, without behavioral disturbance Start aricept 5mg  daily at bedtime x 4 weeks then 10mg  daily at bedtime per spouse's request. Continue to monitor for change in behavior. Fall and pressure ulcer precautions. Continue assist with ADL care.   6. CKD (chronic kidney disease) stage 3, GFR 30-59 ml/min Avoid nephrotoxic agents and continue to monitor renal function  7. Depression Continue effexor 75mg  daily and monitor for change in mood  8. C7 cervical fracture, sequela With mild compression of the superior and inferior endplate. Hard C-collar in place. Continue to f/u with neurosurgery in 2 weeks for repeat xray  9. Generalized muscle weakness Continue to work with PT/OT for gait/strenghth/balance training to restore/maximize functions. Fall risk precautions.   10. HCAP (healthcare-associated pneumonia) Continue levoquin  500mg  daily x 6 more days. Continue mucinx 600mg  twice daily and robitussin DM 100-10mg /59mL every four hours as needed for cough. Continue albuterol every 6 hours as needed for shortness of breath and wheezing. Continue to monitor his status  11. GERD Continue Dexilant 60mg  daily and monitor  12. HLD LDL 77. Continue lipitor 40mg  daily and tricor 145mg  daily  13. Elevated BP SBP at home range 110-110s per spouse. Monitor VS qshift x 5 days then daily. Reassess and continue to monitor.   Diagnostic Studies/Labs Ordered: cbc with diff, cmp next lab draw  Time spent: 40 minutes on care coordination   Family/Staff Communication Plan of care discussed with spouse, Occupational hygienist and nursing staff. Spouse and nursing staff verbalized understanding and agree with plan of care. No additional questions or concerns reported.    Arthur Holms, MSN, AGNP-C [facility] [address] [place], [place] [address] [phone] [8am-5pm] After hours: [phone]

## 2014-11-11 ENCOUNTER — Telehealth: Payer: Self-pay | Admitting: Family Medicine

## 2014-11-11 NOTE — Telephone Encounter (Signed)
plz call on Monday for hosp f/u - pt admitted 2/29-11/09/2014 after fall and cervical neck fracture treated conservatively. If pt went to SNF rec f/u in office once discharged from SNF. If went home, plz schedule 21min hosp f/u this week if able.

## 2014-11-12 ENCOUNTER — Encounter: Payer: Self-pay | Admitting: Family Medicine

## 2014-11-13 ENCOUNTER — Non-Acute Institutional Stay (SKILLED_NURSING_FACILITY): Payer: Medicare Other | Admitting: Internal Medicine

## 2014-11-13 DIAGNOSIS — G4710 Hypersomnia, unspecified: Secondary | ICD-10-CM

## 2014-11-13 DIAGNOSIS — R5381 Other malaise: Secondary | ICD-10-CM | POA: Diagnosis not present

## 2014-11-13 DIAGNOSIS — M25511 Pain in right shoulder: Secondary | ICD-10-CM

## 2014-11-13 DIAGNOSIS — R32 Unspecified urinary incontinence: Secondary | ICD-10-CM | POA: Diagnosis not present

## 2014-11-13 DIAGNOSIS — G250 Essential tremor: Secondary | ICD-10-CM | POA: Diagnosis not present

## 2014-11-13 DIAGNOSIS — D519 Vitamin B12 deficiency anemia, unspecified: Secondary | ICD-10-CM

## 2014-11-13 DIAGNOSIS — R300 Dysuria: Secondary | ICD-10-CM

## 2014-11-13 DIAGNOSIS — F32A Depression, unspecified: Secondary | ICD-10-CM

## 2014-11-13 DIAGNOSIS — I2699 Other pulmonary embolism without acute cor pulmonale: Secondary | ICD-10-CM

## 2014-11-13 DIAGNOSIS — J189 Pneumonia, unspecified organism: Secondary | ICD-10-CM

## 2014-11-13 DIAGNOSIS — F0390 Unspecified dementia without behavioral disturbance: Secondary | ICD-10-CM

## 2014-11-13 DIAGNOSIS — F329 Major depressive disorder, single episode, unspecified: Secondary | ICD-10-CM

## 2014-11-13 DIAGNOSIS — T81718A Complication of other artery following a procedure, not elsewhere classified, initial encounter: Secondary | ICD-10-CM

## 2014-11-13 DIAGNOSIS — S12600S Unspecified displaced fracture of seventh cervical vertebra, sequela: Secondary | ICD-10-CM

## 2014-11-13 DIAGNOSIS — R2681 Unsteadiness on feet: Secondary | ICD-10-CM | POA: Diagnosis not present

## 2014-11-13 DIAGNOSIS — R55 Syncope and collapse: Secondary | ICD-10-CM

## 2014-11-13 DIAGNOSIS — R400 Somnolence: Secondary | ICD-10-CM

## 2014-11-13 DIAGNOSIS — K219 Gastro-esophageal reflux disease without esophagitis: Secondary | ICD-10-CM

## 2014-11-13 NOTE — Telephone Encounter (Signed)
Left voice mail to call back 

## 2014-11-13 NOTE — Progress Notes (Signed)
Patient ID: [name], male   DOB: 1931/02/17, 81 y.o.   MRN: [id number]     Facility: [facility]    PCP: Ria Bush, MD  Code Status: full code  Allergies  Allergen Reactions  . Lactose Intolerance (Gi)   . Nizatidine     REACTION: Rash (Axid)  . Penicillins     REACTION: Rash  . Sulfadiazine     REACTION: Hives    Chief Complaint  Patient presents with  . New Admit To SNF     HPI:  81 year old patient is here for short term rehabilitation post hospital admission from 11/06/14-11/09/14 with unwitnessed fall. Patient was found to have C7 vertebral body fracture with mild compression on cervical spine. Neurosurgery recommended hard collar and follow-up in the clinic. He then had workup for syncope and was treated for HCAP.  He has history of dementia, CAD, stage III CKD, venous thromboembolism on Coumadin. He is seen in his room today with his wife [name]. His inr today is 4. His right shoulder has been bothering him with pain. This is new and has been radiating to his arm and hand. He denies any numbness or tingling. He is wearing his hard collar. As per wife he sleeps for almost around 15 hours. He has recently been started on aricept.he feels tired. He also complaints of dysuria for 2-3 days  Review of Systems:  Constitutional: Negative for fever, chills, diaphoresis.  HENT: Negative for headache, congestion Eyes: Negative for eye pain, blurred vision, double vision and discharge.  Respiratory: Negative for shortness of breath and wheezing.  has some cough and has been using incentive spirometer Cardiovascular: Negative for chest pain, palpitations, leg swelling.  Gastrointestinal: Negative for heartburn, nausea, vomiting, abdominal pain. Last bowel movement 2 days back and at home was having 2-3 bowel movement a day. Took colace 100 mg daily at home Genitourinary: Negative for flank pain.  Musculoskeletal: Negative for focal weakness. High  fall risk. Skin: Negative for itching, rash.  Neurological: Negative for dizziness, tingling, focal weakness Psychiatric/Behavioral: Negative for depression. Has some memory loss   Past Medical History  Diagnosis Date  . GERD (gastroesophageal reflux disease)     h/o PUD  . CAD (coronary artery disease)     cath 2000 30% single vessel, normal nuclear stress test 12/03/2010, no evidence ischemia  . CKD (chronic kidney disease) stage 3, GFR 30-59 ml/min     baseline Cr 1.7  . Depression   . Hx pulmonary embolism 08/1991    coumadin  . History of phlebitis   . Allergic rhinitis   . Urge incontinence   . Elevated PSA     previous-normalized (followed by Dr. Jeffie Pollock, rec no repeat unless urinary sxs)  . HLD (hyperlipidemia)     hypertriglyceridemia  . Asthma     remote  . Arthritis   . Blood transfusion 1990's  . Schatzki's ring 02/2012    s/p dilation [name]  . History of fracture of right hip   . Hearing loss 06/2012    eval - rec annual exam  . Essential tremor 09/27/2008  . Prediabetes   . DISH (diffuse idiopathic skeletal hyperostosis) 03/2013    lumbar spine on xray  . DDD (degenerative disc disease) 03/2013    by CT, diffuse multilevel cervical and lumbar spondylosis  . Osteoporosis 11/2013    DEXA T score -2.7  . Macrocytic anemia     stable B12/folate and periph smear 05/2013  .  Sleep apnea     no CPAP- sleep apnea "cleared up 15 years ago"  . Dizziness     multifactorial, s/p PT at [facility] 06/2014 with HEP  . History of pyelonephritis 05/2014    hospitalization with sepsis   Past Surgical History  Procedure Laterality Date  . Tonsillectomy  1965  . Hernia repair  1979    Left  . Cystoscopy  1992    for kidney stones  . Exploratory laparotomy  1996    with incidental appendectomy  . Appendectomy  1996  . Cardiac catheterization  2000    30% one vessel  . Knee arthroscopy  03/24/02    Right (Dr. Mauri Pole)  . Orif femoral neck fracture w/ dhs  08/03/02    Right  (Dr. Mauri Pole)  . Ct abd w & pelvis wo cm  07/2001    Scarring of right lung, stable negative o/w  . Ct abd w & pelvis wo cm  11/2000    ? stones, right LL scarring  . V/q scan  06/1999    negative  . [place]oscopy  [place]    [place]  . US echocardiography  12/28/2007    Mild aortic valve calcification EF 55%, basically nml  . Carotid u/s  12/28/2007    nml  . Eeg  11/12/2009    nml  . Mri  11/2009    Head, nml  . Cataract extraction  Jan, March 2011    bilateral  . Colonoscopy  07/2010    5 polyps, adenomatous, rec rpt 3 yrs  . Esophagogastroduodenoscopy  02/2012    dilation of schatzki's ring [name]  . Upper gastrointestinal endoscopy    . Colonoscopy  04/2014    3 polyps, adenomatous, f/u open ended given age [name]   Social History:   reports that he has never smoked. He has never used smokeless tobacco. He reports that he does not drink alcohol or use illicit drugs.  Family History  Problem Relation Age of Onset  . Stroke Mother   . Hypertension Mother   . Cancer Brother     prostate with mets  . Blindness Brother     legally  . Diabetes Brother   . Pulmonary embolism Sister     from shoulder operation  . Alcohol abuse Brother   . Colon cancer Neg Hx   . Esophageal cancer Neg Hx   . Rectal cancer Neg Hx   . Stomach cancer Neg Hx     Medications: Patient's Medications  New Prescriptions   No medications on file  Previous Medications   ALBUTEROL (PROVENTIL HFA;VENTOLIN HFA) 108 (90 BASE) MCG/ACT INHALER    Inhale 2 puffs into the lungs every 6 (six) hours as needed for wheezing or shortness of breath.   ATORVASTATIN (LIPITOR) 40 MG TABLET    Take 40 mg by mouth at bedtime.   CHOLECALCIFEROL (VITAMIN D) 2000 UNITS CAPS    Take 1 capsule by mouth daily.   COENZYME Q10 (COQ-10) 100 MG CAPS    Take 1 capsule by mouth at bedtime.    CYANOCOBALAMIN (B-12 PO)    Take 10,000 mcg by mouth every other day.    DEXLANSOPRAZOLE (DEXILANT) 60 MG CAPSULE    Take 60 mg by  mouth daily.   DONEPEZIL (ARICEPT) 5 MG TABLET    Take 5 mg by mouth at bedtime.   FENOFIBRATE (TRICOR) 145 MG TABLET    TAKE 1 TABLET DAILY   FEXOFENADINE (ALLEGRA) 180 MG TABLET  Take 180 mg by mouth every evening.    GABAPENTIN (NEURONTIN) 100 MG CAPSULE    Take 100 mg by mouth 3 (three) times daily.   GUAIFENESIN (MUCINEX) 600 MG 12 HR TABLET    Take 1 tablet (600 mg total) by mouth 2 (two) times daily.   GUAIFENESIN-DEXTROMETHORPHAN (ROBITUSSIN DM) 100-10 MG/5ML SYRUP    Take 5 mLs by mouth every 4 (four) hours as needed for cough.   LEVOFLOXACIN (LEVAQUIN) 500 MG TABLET    Take 1 tablet (500 mg total) by mouth daily.   MULTIPLE VITAMINS-MINERALS (MULTIVITAMIN PO)    Take by mouth daily.   OXYBUTYNIN (DITROPAN) 5 MG TABLET    Take 5 mg by mouth 2 (two) times daily.   OXYCODONE (OXY IR/ROXICODONE) 5 MG IMMEDIATE RELEASE TABLET    Take 1 tablet (5 mg total) by mouth every 4 (four) hours as needed for severe pain.   SENNA-DOCUSATE (SENOKOT-S) 8.6-50 MG PER TABLET    Take 1 tablet by mouth 2 (two) times daily as needed for mild constipation.   VENLAFAXINE (EFFEXOR) 75 MG TABLET    Take 75 mg by mouth daily.   WARFARIN (COUMADIN) 3 MG TABLET    Take 1.5-3 mg by mouth daily. 3mg  daily except 1.5mg  on Tuesdays  Modified Medications   No medications on file  Discontinued Medications   No medications on file     Physical Exam: Filed Vitals:   11/13/14 1514  BP: 116/67  Pulse: 69  Temp: 98.2 F (36.8 C)  Resp: 18  SpO2: 93%    General- elderly male, well built, in no acute distress Head- normocephalic, atraumatic Throat- moist mucus membrane Eyes- PERRLA, EOMI, no pallor, no icterus, no discharge, normal conjunctiva, normal sclera Neck- no cervical lymphadenopathy Cardiovascular- normal s1,s2, no murmurs, normal dorsalis pedis and radial pulses, no leg edema Respiratory- bilateral decreased air entry, no wheeze, no rhonchi, no crackles, no use of accessory muscles Abdomen- bowel  sounds present, soft, non tender Musculoskeletal- able to move all 4 extremities, right arm movement limited at right shoulder joint, able to lift his arm from bed to 20 degrees, can lift it further up with assistance and pain. Hard collar in place Neurological- no focal deficit but patient falling asleep between conversation Skin- warm and dry, several brusies Psychiatry- alert and oriented to person    Labs reviewed: Basic Metabolic Panel:  Recent Labs  11/06/14 1750 11/07/14 0454 11/08/14 0405 11/09/14 0537  NA 138 143 138 139  K 4.0 4.8 4.4 4.5  CL 107 106 108 104  CO2 27 29 25 27   GLUCOSE 185* 166* 124* 134*  BUN 26* 29* 25* 23  CREATININE 1.88* 1.79* 1.62* 1.31  CALCIUM 9.2 9.5 8.7 8.8  MG 1.8  --   --   --    Liver Function Tests:  Recent Labs  11/07/14 0454 11/08/14 0405 11/09/14 0537  AST 84* 68* 64*  ALT 54* 48 39  ALKPHOS 29* 34* 35*  BILITOT 1.0 1.3* 1.6*  PROT 5.3* 5.6* 5.4*  ALBUMIN 3.2* 3.0* 2.7*    Recent Labs  11/14/13 1630  LIPASE 14.0   No results for input(s): AMMONIA in the last 8760 hours. CBC:  Recent Labs  11/07/14 0949 11/07/14 1559 11/07/14 2056 11/09/14 0537  WBC 9.2 7.9 7.9 8.7  NEUTROABS 7.5 6.3 6.0  --   HGB 11.1* 10.8* 11.1* 10.4*  HCT 33.6* 32.7* 34.0* 30.5*  MCV 106.0* 106.9* 108.6* 106.3*  PLT 105* 94* 93* 130*  Cardiac Enzymes:  Recent Labs  06/03/14 0650 11/07/14 1600 11/07/14 2056 11/08/14 0405  CKTOTAL 55  --   --   --   TROPONINI  --  <0.03 <0.03 0.03   BNP: Invalid input(s): POCBNP CBG:  Recent Labs  11/07/14 0659 11/09/14 0704  GLUCAP 142* 134*    Radiological Exams: 11/07/14: 2D Echo -Left ventricle: The cavity size was normal. Wall thickness was normal. Systolic function was vigorous. The estimated ejection fraction was in the range of 65% to 70%. Wall motion was normal; there were no regional wall motion abnormalities. Doppler parameters are consistent with abnormal left ventricular  relaxation (grade 1 diastolic dysfunction). The E/e&' ratio isbetween 8-15, suggesting indeterminate LV filling pressure. - Aortic valve: Trileaflet. Sclerosis without stenosis. There was trivial to mild regurgitation. - Mitral valve: Calcified annulus. - Left atrium: The atrium was normal in size.  11/07/14: CXR Increasing bibasilar airspace opacities are concerning for multifocal pneumonia. Small left pleural effusion noted.  11/06/14: CT cervical spine 1. No intracranial trauma. 2. Atrophy and microvascular disease unchanged. 3. Acute fracture of the C7 vertebral body with mild compression of the superior and inferior endplates.  Assessment/Plan  Physical deconditioning Will have him work with physical therapy and occupational therapy team to help with gait training and muscle strengthening exercises.fall precautions. Skin care. Encourage to be out of bed.   Gait instability Here for gait traingin and strengthening exercised, monitor his progress with PT and OT  Syncope EF 65-70% with grade 1 diastolic dysfunction on recent 2D Echo.Negative serial cardiac enzymes. Pending outpatient holter monitor, has appointment with Dr Fletcher Anon on 11/24/14 at 3 pm in [facility].  C7 cervical fracture, sequela With mild compression of the superior and inferior endplate. Hard C-collar in place. Has f/u with neurosurgery in 2 weeks for repeat xray. On oxyIR 5 mg q4h prn pain  Constipation Add colace 100 mg bid x 3 days with miralax 17 g po daily x 3 days, then both prn  HCAP Continue and complete course of levaquin, mucinex and prn albuterol, continue incentive spirometer  Right shoulder pain New onset, no neurological deficit on exam but pain limiting his mobility. Will have him on tylenol 650 mg tid for pain and get xray right should to assess further  Daytime somnolence Cut down on gabapentin to 100 mg bid form tid, decrease ditropan to daily from bid, avoid sedative/ narcotics where  possibe.discontinue aricpet. Reassess.  Pulmonary embolism On chronic oral anticoagulation with coumadin. INR 4 today. Hold coumadin for now. inr check 11/14/14. High fall risk patient, discussed with family about risk of coumadin when one is a high fall risk  Vitamin B12 deficiency Continue vitamin B12 supplement   UI On ditropan 5 mg bid. With his increased drowsiness, change to once a day for now  Essential tremor Continue gabapentin 100mg  but change to bid from tid given his increased sleepiness   Dementia, without behavioral disturbance Was started on aricet recently but given his increased somnolence and pending arrythmia workup, avoid aricept as it can further contribute to this. Monitor clinically for now. Fall and pressure ulcer precautions. Continue assist with ADL care.   Depression Continue effexor 75mg  daily   GERD Continue Dexilant 60mg  daily and monitor   Goals of care: short term rehabilitation  Labs/tests ordered: cbc, cmp 1 week  Family/ staff Communication: reviewed care plan with patient and nursing supervisor    Blanchie Serve, MD  [place] (Monday-Friday 8 am - 5 pm)  [phone] (afterhours)

## 2014-11-14 LAB — CULTURE, BLOOD (ROUTINE X 2)
Culture: NO GROWTH
Culture: NO GROWTH

## 2014-11-14 NOTE — Telephone Encounter (Signed)
Left voice mail for a call back

## 2014-11-15 NOTE — Telephone Encounter (Signed)
Spoke with patient's wife. He is at [facility]. She said she is very discouraged with them because they have only done 1 PT treatment and have left him in his excrement. She said he called and called for help to the restroom and no one ever came. Then one person came in the room and he asked them for help and they said they "didn't do that" and that they would get someone, but no one ever came,so he messed himself. She said she is going to try to get some answers today as to what is going on, but if she doesn't-then she is going to try to get him transferred to [facility] or [place]. She will schedule a follow up with you when a discharge plan has been discussed.

## 2014-11-15 NOTE — Telephone Encounter (Signed)
Please see Mychart message.

## 2014-11-17 LAB — BASIC METABOLIC PANEL
BUN: 46 mg/dL — AB (ref 4–21)
CREATININE: 1.7 mg/dL — AB (ref 0.6–1.3)
Glucose: 102 mg/dL
Potassium: 4.4 mmol/L (ref 3.4–5.3)
SODIUM: 137 mmol/L (ref 137–147)

## 2014-11-17 LAB — CBC AND DIFFERENTIAL
HCT: 31 % — AB (ref 41–53)
HEMOGLOBIN: 10.7 g/dL — AB (ref 13.5–17.5)
Platelets: 247 10*3/uL (ref 150–399)
WBC: 6.8 10^3/mL

## 2014-11-17 LAB — POCT INR: INR: 3.7 — AB (ref 0.9–1.1)

## 2014-11-20 ENCOUNTER — Encounter: Payer: Self-pay | Admitting: Registered Nurse

## 2014-11-20 ENCOUNTER — Non-Acute Institutional Stay (SKILLED_NURSING_FACILITY): Payer: Medicare Other | Admitting: Registered Nurse

## 2014-11-20 DIAGNOSIS — R791 Abnormal coagulation profile: Secondary | ICD-10-CM

## 2014-11-20 DIAGNOSIS — I2699 Other pulmonary embolism without acute cor pulmonale: Secondary | ICD-10-CM

## 2014-11-20 DIAGNOSIS — T81718A Complication of other artery following a procedure, not elsewhere classified, initial encounter: Secondary | ICD-10-CM | POA: Diagnosis not present

## 2014-11-20 LAB — POCT INR: INR: 1.8 — AB (ref 0.9–1.1)

## 2014-11-20 NOTE — Progress Notes (Signed)
Patient ID: [name], male   DOB: Oct 05, 1930, 81 y.o.   MRN: [id number]   Place of Service: [facility]  Allergies  Allergen Reactions  . Lactose Intolerance (Gi)   . Nizatidine     REACTION: Rash (Axid)  . Penicillins     REACTION: Rash  . Sulfadiazine     REACTION: Hives    Code Status: Full Code  Goals of Care: Longevity/STR  Chief Complaint  Patient presents with  . Acute Visit    subtherapeutic INR    HPI  81 y.o. male with PMH of CAD, deprssion, HLD, dementia, remote asthma among others is being seen  is being seen for management of warfarin therapy. Patient is on long-term warfarin anticoagulation for pulmonary embolism. Goal INR is 2 to 3. Today INR is 1.8 and current warfarin dose is 3mg  daily except 1.5mg  daily on Tuesday. INR on 11/17/14 was 3.7 and coumadin was held x 3 days. No upcoming procedure. Seen in room today. Denies any concerns. Spouse, [name] is at bedside.   Review of Systems Constitutional: Negative for fever and chills. Cardiovascular: Negative for chest pain, palpitations, and leg swelling Respiratory: Negative cough and shortness of breath Gastrointestinal: Negative for nausea and vomiting. Negative for abdominal pain. Negative for bloody stool.   Genitourinary: Negative for hematuria Musculoskeletal: Negative for back pain Neurological: Negative for dizziness and headache Skin: Negative for rash Psychiatric: Negative for depression  Past Medical History  Diagnosis Date  . GERD (gastroesophageal reflux disease)     h/o PUD  . CAD (coronary artery disease)     cath 2000 30% single vessel, normal nuclear stress test 12/03/2010, no evidence ischemia  . CKD (chronic kidney disease) stage 3, GFR 30-59 ml/min     baseline Cr 1.7  . Depression   . Hx pulmonary embolism 08/1991    coumadin  . History of phlebitis   . Allergic rhinitis   . Urge incontinence   . Elevated PSA     previous-normalized (followed by Dr. Jeffie Pollock, rec no  repeat unless urinary sxs)  . HLD (hyperlipidemia)     hypertriglyceridemia  . Asthma     remote  . Arthritis   . Blood transfusion 1990's  . Schatzki's ring 02/2012    s/p dilation [name]  . History of fracture of right hip   . Hearing loss 06/2012    eval - rec annual exam  . Essential tremor 09/27/2008  . Prediabetes   . DISH (diffuse idiopathic skeletal hyperostosis) 03/2013    lumbar spine on xray  . DDD (degenerative disc disease) 03/2013    by CT, diffuse multilevel cervical and lumbar spondylosis  . Osteoporosis 11/2013    DEXA T score -2.7  . Macrocytic anemia     stable B12/folate and periph smear 05/2013  . Sleep apnea     no CPAP- sleep apnea "cleared up 15 years ago"  . Dizziness     multifactorial, s/p PT at [facility] 06/2014 with HEP  . History of pyelonephritis 05/2014    hospitalization with sepsis    Past Surgical History  Procedure Laterality Date  . Tonsillectomy  1965  . Hernia repair  1979    Left  . Cystoscopy  1992    for kidney stones  . Exploratory laparotomy  1996    with incidental appendectomy  . Appendectomy  1996  . Cardiac catheterization  2000    30% one vessel  . Knee arthroscopy  03/24/02  Right (Dr. Mauri Pole)  . Orif femoral neck fracture w/ dhs  08/03/02    Right (Dr. Mauri Pole)  . Ct abd w & pelvis wo cm  07/2001    Scarring of right lung, stable negative o/w  . Ct abd w & pelvis wo cm  11/2000    ? stones, right LL scarring  . V/q scan  06/1999    negative  . [place]oscopy  1998    [place]  . US echocardiography  12/28/2007    Mild aortic valve calcification EF 55%, basically nml  . Carotid u/s  12/28/2007    nml  . Eeg  11/12/2009    nml  . Mri  11/2009    Head, nml  . Cataract extraction  Jan, March 2011    bilateral  . Colonoscopy  07/2010    5 polyps, adenomatous, rec rpt 3 yrs  . Esophagogastroduodenoscopy  02/2012    dilation of schatzki's ring [name]  . Upper gastrointestinal endoscopy    . Colonoscopy  04/2014    3  polyps, adenomatous, f/u open ended given age [name]    History   Social History  . Marital Status: Married    Spouse Name: N/A  . Number of Children: 2  . Years of Education: N/A   Occupational History  . 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 Smoking status: Never Smoker   . Smokeless tobacco: Never Used  . Alcohol Use: No  . Drug Use: No  . Sexual Activity: Not on file   Other Topics Concern  . Not on file   Social History Narrative   Married with 2 children   Husband of Alireza Pollack    Retired: was principal    Activity: walks dog 2-3 times daily about 28min, frequent stops    Diet: some water, good fruits/vegetables, fish 1x/wk, no sodas.      Advanced directives: Chauncey Reading is wife, [name]. Does not want prolonged life support if terminal    Family History  Problem Relation Age of Onset  . Stroke Mother   . Hypertension Mother   . Cancer Brother     prostate with mets  . Blindness Brother     legally  . Diabetes Brother   . Pulmonary embolism Sister     from shoulder operation  . Alcohol abuse Brother   . Colon cancer Neg Hx   . Esophageal cancer Neg Hx   . Rectal cancer Neg Hx   . Stomach cancer Neg Hx       Medication List       This list is accurate as of: 11/20/14  8:58 PM.  Always use your most recent med list.               albuterol 108 (90 BASE) MCG/ACT inhaler  Commonly known as:  PROVENTIL HFA;VENTOLIN HFA  Inhale 2 puffs into the lungs every 6 (six) hours as needed for wheezing or shortness of breath.     atorvastatin 40 MG tablet  Commonly known as:  LIPITOR  Take 40 mg by mouth at bedtime.     B-12 PO  Take 10,000 mcg by mouth every other day.     CoQ-10 100 MG Caps  Take 1 capsule by mouth at bedtime.     DEXILANT 60 MG capsule  Generic drug:  dexlansoprazole  Take 60 mg by mouth daily.     donepezil 5 MG tablet  Commonly  known as:  ARICEPT  Take 5 mg by mouth at bedtime.      fenofibrate 145 MG tablet  Commonly known as:  TRICOR  TAKE 1 TABLET DAILY     fexofenadine 180 MG tablet  Commonly known as:  ALLEGRA  Take 180 mg by mouth every evening.     gabapentin 100 MG capsule  Commonly known as:  NEURONTIN  Take 100 mg by mouth 3 (three) times daily.     guaiFENesin 600 MG 12 hr tablet  Commonly known as:  MUCINEX  Take 1 tablet (600 mg total) by mouth 2 (two) times daily.     guaiFENesin-dextromethorphan 100-10 MG/5ML syrup  Commonly known as:  ROBITUSSIN DM  Take 5 mLs by mouth every 4 (four) hours as needed for cough.     MULTIVITAMIN PO  Take by mouth daily.     oxybutynin 5 MG tablet  Commonly known as:  DITROPAN  Take 5 mg by mouth 2 (two) times daily.     oxyCODONE 5 MG immediate release tablet  Commonly known as:  Oxy IR/ROXICODONE  Take 1 tablet (5 mg total) by mouth every 4 (four) hours as needed for severe pain.     senna-docusate 8.6-50 MG per tablet  Commonly known as:  Senokot-S  Take 1 tablet by mouth 2 (two) times daily as needed for mild constipation.     venlafaxine 75 MG tablet  Commonly known as:  EFFEXOR  Take 75 mg by mouth daily.     Vitamin D 2000 UNITS Caps  Take 1 capsule by mouth daily.     warfarin 2.5 MG tablet  Commonly known as:  COUMADIN  Take 2.5 mg by mouth daily.        Physical Exam  BP 117/62 mmHg  Pulse 71  Temp(Src) 98.4 F (36.9 C)  Resp 18  SpO2 95%  Constitutional: Frail elderly male in no acute distress. Conversant and pleasantly confused at times. Bil hand tremors noted.  HEENT: Normocephalic and atraumatic. PERRL. EOM intact. No scleral icterus. Oral mucosa moist. Posterior pharynx clear of any exudate or lesions.  Neck: Hard C-collar in place  Cardiac: Normal S1, S2. RRR without appreciable murmurs, rubs, or gallops. Distal pulses intact. Trace dependent edema.  Lungs: No respiratory distress. Breath sounds diminished bilaterally without rales, rhonchi, or wheezes.  Abdomen: Audible  bowel sounds in all quadrants. Soft, nontender, nondistended.  Musculoskeletal: able to move all extremities. Generalized weakness present.  Skin: Warm and dry. No rash noted. No erythema.  Neurological: Alert and oriented to self  Psychiatric: Appropriate mood and affect.    Labs Reviewed  Lab Results  Component Value Date   INR 1.8* 11/20/2014   INR 3.7* 11/17/2014   INR 2.2* 11/10/2014    Assessment & Plan 1. Subtherapeutic international normalized ratio (INR) 2. EMBOLISM & INFARCTION, IATROGENIC PULMONARY INR 1.8 today. Start coumadin at 2.5mg  daily. Recheck INR on 11/27/14. Continue to monitor his status  Family/Staff Communication Plan of care discussed with patient, wife, [name] and nursing supervisir. Patient, wife,  and nursing supervisor verbalized understanding and agree with plan of care. No additional questions or concerns reported.    Arthur Holms, MSN, AGNP-C [facility] [address] [place],  [address] [phone] [8am-5pm] After hours: [phone]

## 2014-11-22 ENCOUNTER — Non-Acute Institutional Stay (SKILLED_NURSING_FACILITY): Payer: Medicare Other | Admitting: Registered Nurse

## 2014-11-22 DIAGNOSIS — R109 Unspecified abdominal pain: Secondary | ICD-10-CM | POA: Diagnosis not present

## 2014-11-23 ENCOUNTER — Encounter: Payer: Self-pay | Admitting: Registered Nurse

## 2014-11-23 NOTE — Progress Notes (Signed)
Patient ID: [name], male   DOB: Jul 21, 1931, 81 y.o.   MRN: [id number]   Place of Service: [facility]  Allergies  Allergen Reactions  . Lactose Intolerance (Gi)   . Nizatidine     REACTION: Rash (Axid)  . Penicillins     REACTION: Rash  . Sulfadiazine     REACTION: Hives    Code Status: Full Code  Goals of Care: Longevity/STR  Chief Complaint  Patient presents with  . Acute Visit    abdominal pain    HPI 81 y.o. male with PMH of CAD, PE on chronic oral anticoagulation with coumadin, deprssion, HLD, dementia, remote asthma, C7 vertebral fracture among others is being seen for for an acute visit at the request of nursing supervisor for the evaluation of abdominal pain. Described pain as intermittent, burning on the left and right upper quadrants L>R. Pain medication provides some relief. Pain is worse with movement and coughing. Denies fever, chills, headache, dizziness, chest pain, shortness of breath, n/v, or change in bowel habit. Last bm was yesterday. Denies any other concerns.    Past Medical History  Diagnosis Date  . GERD (gastroesophageal reflux disease)     h/o PUD  . CAD (coronary artery disease)     cath 2000 30% single vessel, normal nuclear stress test 12/03/2010, no evidence ischemia  . CKD (chronic kidney disease) stage 3, GFR 30-59 ml/min     baseline Cr 1.7  . Depression   . Hx pulmonary embolism 08/1991    coumadin  . History of phlebitis   . Allergic rhinitis   . Urge incontinence   . Elevated PSA     previous-normalized (followed by Dr. Jeffie Pollock, rec no repeat unless urinary sxs)  . HLD (hyperlipidemia)     hypertriglyceridemia  . Asthma     remote  . Arthritis   . Blood transfusion 1990's  . Schatzki's ring 02/2012    s/p dilation [name]  . History of fracture of right hip   . Hearing loss 06/2012    eval - rec annual exam  . Essential tremor 09/27/2008  . Prediabetes   . DISH (diffuse idiopathic skeletal hyperostosis)  03/2013    lumbar spine on xray  . DDD (degenerative disc disease) 03/2013    by CT, diffuse multilevel cervical and lumbar spondylosis  . Osteoporosis 11/2013    DEXA T score -2.7  . Macrocytic anemia     stable B12/folate and periph smear 05/2013  . Sleep apnea     no CPAP- sleep apnea "cleared up 15 years ago"  . Dizziness     multifactorial, s/p PT at [facility] 06/2014 with HEP  . History of pyelonephritis 05/2014    hospitalization with sepsis    Past Surgical History  Procedure Laterality Date  . Tonsillectomy  1965  . Hernia repair  1979    Left  . Cystoscopy  1992    for kidney stones  . Exploratory laparotomy  1996    with incidental appendectomy  . Appendectomy  1996  . Cardiac catheterization  2000    30% one vessel  . Knee arthroscopy  03/24/02    Right (Dr. Mauri Pole)  . Orif femoral neck fracture w/ dhs  08/03/02    Right (Dr. Mauri Pole)  . Ct abd w & pelvis wo cm  07/2001    Scarring of right lung, stable negative o/w  . Ct abd w & pelvis wo cm  11/2000    ?  stones, right LL scarring  . V/q scan  06/1999    negative  . [place]  [place]  . US echocardiography  12/28/2007    Mild aortic valve calcification EF 55%, basically nml  . Carotid u/s  12/28/2007    nml  . Eeg  11/12/2009    nml  . Mri  11/2009    Head, nml  . Cataract extraction  Jan, March 2011    bilateral  . Colonoscopy  07/2010    5 polyps, adenomatous, rec rpt 3 yrs  . Esophagogastroduodenoscopy  02/2012    dilation of schatzki's ring [name]  . Upper gastrointestinal endoscopy    . Colonoscopy  04/2014    3 polyps, adenomatous, f/u open ended given age [name]    History  Substance Use Topics  . Smoking status: Never Smoker   . Smokeless tobacco: Never Used  . Alcohol Use: No    Family History  Problem Relation Age of Onset  . Stroke Mother   . Hypertension Mother   . Cancer Brother     prostate with mets  . Blindness Brother     legally  . Diabetes Brother   .  Pulmonary embolism Sister     from shoulder operation  . Alcohol abuse Brother   . Colon cancer Neg Hx   . Esophageal cancer Neg Hx   . Rectal cancer Neg Hx   . Stomach cancer Neg Hx       Medication List       This list is accurate as of: 11/22/14 11:59 PM.  Always use your most recent med list.               albuterol 108 (90 BASE) MCG/ACT inhaler  Commonly known as:  PROVENTIL HFA;VENTOLIN HFA  Inhale 2 puffs into the lungs every 6 (six) hours as needed for wheezing or shortness of breath.     atorvastatin 40 MG tablet  Commonly known as:  LIPITOR  Take 40 mg by mouth at bedtime.     B-12 PO  Take 10,000 mcg by mouth every other day.     CoQ-10 100 MG Caps  Take 1 capsule by mouth at bedtime.     DEXILANT 60 MG capsule  Generic drug:  dexlansoprazole  Take 60 mg by mouth daily.     donepezil 5 MG tablet  Commonly known as:  ARICEPT  Take 5 mg by mouth at bedtime.     fenofibrate 145 MG tablet  Commonly known as:  TRICOR  TAKE 1 TABLET DAILY     fexofenadine 180 MG tablet  Commonly known as:  ALLEGRA  Take 180 mg by mouth every evening.     gabapentin 100 MG capsule  Commonly known as:  NEURONTIN  Take 100 mg by mouth 3 (three) times daily.     guaiFENesin 600 MG 12 hr tablet  Commonly known as:  MUCINEX  Take 1 tablet (600 mg total) by mouth 2 (two) times daily.     guaiFENesin-dextromethorphan 100-10 MG/5ML syrup  Commonly known as:  ROBITUSSIN DM  Take 5 mLs by mouth every 4 (four) hours as needed for cough.     MULTIVITAMIN PO  Take by mouth daily.     oxybutynin 5 MG tablet  Commonly known as:  DITROPAN  Take 5 mg by mouth 2 (two) times daily.     oxyCODONE 5 MG immediate release tablet  Commonly known as:  Oxy IR/ROXICODONE  Take 1 tablet (5 mg total) by mouth every 4 (four) hours as needed for severe pain.     senna-docusate 8.6-50 MG per tablet  Commonly known as:  Senokot-S  Take 1 tablet by mouth 2 (two) times daily as needed for  mild constipation.     venlafaxine 75 MG tablet  Commonly known as:  EFFEXOR  Take 75 mg by mouth daily.     Vitamin D 2000 UNITS Caps  Take 1 capsule by mouth daily.     warfarin 2.5 MG tablet  Commonly known as:  COUMADIN  Take 2.5 mg by mouth daily.        Physical Exam  BP 136/64 mmHg  Pulse 72  Temp(Src) 97 F (36.1 C)  Resp 18  SpO2 97%  Constitutional: Frail elderly male in no acute distress. Conversant and pleasantly confused at times. Bil hand tremors noted.  HEENT: Normocephalic and atraumatic. PERRL. EOM intact. Neck: Hard C-collar in place Cardiac: Normal S1, S2. RRR without appreciable murmurs, rubs, or gallops. Distal pulses intact. Trace dependent edema.  Lungs: No respiratory distress. Breath sounds clear bilaterally without rales, rhonchi, or wheezes. Abdomen: Audible bowel sounds in all quadrants. Soft, nontender. Left upper and Right upper quadrant/lower ribs tender to palpation.  Musculoskeletal: able to move all extremities. Generalized weakness present.  Skin: Warm and dry. No rash noted. No erythema.  Neurological: Alert and oriented to self Psychiatric:  Appropriate mood and affect.   Labs Reviewed  CBC Latest Ref Rng 11/17/2014 11/09/2014 11/07/2014  WBC - 6.8 8.7 7.9  Hemoglobin 13.5 - 17.5 g/dL 10.7(A) 10.4(L) 11.1(L)  Hematocrit 41 - 53 % 31(A) 30.5(L) 34.0(L)  Platelets 150 - 399 K/L 247 130(L) 93(L)    CMP Latest Ref Rng 11/17/2014 11/09/2014 11/08/2014  Glucose 70 - 99 mg/dL - 134(H) 124(H)  BUN 4 - 21 mg/dL 46(A) 23 25(H)  Creatinine 0.6 - 1.3 mg/dL 1.7(A) 1.31 1.62(H)  Sodium 137 - 147 mmol/L 137 139 138  Potassium 3.4 - 5.3 mmol/L 4.4 4.5 4.4  Chloride 96 - 112 mmol/L - 104 108  CO2 19 - 32 mmol/L - 27 25  Calcium 8.4 - 10.5 mg/dL - 8.8 8.7  Total Protein 6.0 - 8.3 g/dL - 5.4(L) 5.6(L)  Total Bilirubin 0.3 - 1.2 mg/dL - 1.6(H) 1.3(H)  Alkaline Phos 39 - 117 U/L - 35(L) 34(L)  AST 0 - 37 U/L - 64(H) 68(H)  ALT 0 - 53 U/L - 39 48     Lipid Panel     Component Value Date/Time   CHOL 139 10/30/2014 0940   TRIG 183.0* 10/30/2014 0940   HDL 25.50* 10/30/2014 0940   CHOLHDL 5 10/30/2014 0940   VLDL 36.6 10/30/2014 0940   LDLCALC 77 10/30/2014 0940   LDLDIRECT 82.6 11/26/2009 0922   Diagnostic Studies Reviewed 11/07/14: 2D Echo -Left ventricle: The cavity size was normal. Wall thickness was normal. Systolic function was vigorous. The estimated ejectionfraction was in the range of 65% to 70%. Wall motion was normal; there were no regional wall motion abnormalities. Doppler parameters are consistent with abnormal left ventricular relaxation (grade 1 diastolic dysfunction). The E/e&' ratio isbetween 8-15, suggesting indeterminate LV filling pressure. - Aortic valve: Trileaflet. Sclerosis without stenosis. There was trivial to mild regurgitation. - Mitral valve: Calcified annulus. - Left atrium: The atrium was normal in size.  11/07/14: CXR Increasing bibasilar airspace opacities are concerning for multifocal pneumonia. Small left pleural effusion noted.  11/06/14: CT cervical spine 1. No intracranial trauma.  2. Atrophy and microvascular disease unchanged. 3. Acute fracture of the C7 vertebral body with mild compression of the superior and inferior endplates. 4. Bulky osteophytosis from C to through C7 anteriorly.  Assessment & Plan 1. Abdominal pain, unspecified abdominal location Stat KUB for further evaluation and to rule out acute abdominal processes. Suspected pain to be more pneumonic secondary to pneumonia as location of pain is more toward chest/rib areas vs. Abdominal. Continue oxycodone 5mg  every four hours as needed for pain. Continue to monitor his status.   Addendum: KUB 11/22/14: No acute abdominal abnormality appreciable.   Family/Staff Communication Plan of care discussed with patient and nursing staff. Patient and nursing staff verbalized understanding and agree with plan of care. No additional questions  or concerns reported.    Arthur Holms, MSN, AGNP-C [facility] [address] [place], [place] [address] [phone] [8am-5pm] After hours: [phone]

## 2014-11-24 ENCOUNTER — Ambulatory Visit (INDEPENDENT_AMBULATORY_CARE_PROVIDER_SITE_OTHER): Payer: Medicare Other | Admitting: Cardiovascular Disease

## 2014-11-24 ENCOUNTER — Encounter: Payer: Self-pay | Admitting: Cardiovascular Disease

## 2014-11-24 VITALS — BP 126/71 | HR 102 | Ht 68.0 in | Wt 163.0 lb

## 2014-11-24 DIAGNOSIS — R55 Syncope and collapse: Secondary | ICD-10-CM

## 2014-11-24 DIAGNOSIS — R079 Chest pain, unspecified: Secondary | ICD-10-CM | POA: Diagnosis not present

## 2014-11-24 DIAGNOSIS — I951 Orthostatic hypotension: Secondary | ICD-10-CM | POA: Diagnosis not present

## 2014-11-24 NOTE — Assessment & Plan Note (Addendum)
The patient possibly had a syncopal episode recently. However, it is difficult to obtain history due to patient's dementia. There is a possibility that it was a mechanical fall. He is noted to be significantly orthostatic today which might be contributing. Arrhythmia is a possibility but less likely given normal LV systolic function. I requested a 48-hour Holter monitor. I advised the patient and his wife to stay well-hydrated and to start using thigh-high support stockings during the day. If the patient continues to have an unsteady gait and frequent falls, we should evaluate the risks and benefits of continued anticoagulation. His pulmonary embolism was in the 90s that he has been kept on it due to family history of hypercoagulable state.

## 2014-11-24 NOTE — Progress Notes (Signed)
Primary care physician: Dr. Danise Mina  HPI  This is an 81 year old male who was referred for evaluation of possible syncope. He has no previous cardiac history. He has known history of dementia which has been progressive, hypertension, and previous pulmonary embolism in the 90s on life long anticoagulation due to family history of hypercoagulable state. He had an unwitnessed fall recently. He is not able to provide much details but according to the wife the patient was walking and possibly trying to go up 2 steps. He came and found him on the floor. He was groggy. He was found to have a C7 compression fracture. The patient complains of orthostatic dizziness. No chest pain, shortness of breath or palpitations. He is overall a poor historian. He had an echocardiogram done which showed normal LV systolic function with no significant valvular abnormalities.  Allergies  Allergen Reactions  . Lactose Intolerance (Gi)   . Nizatidine     REACTION: Rash (Axid)  . Penicillins     REACTION: Rash  . Sulfadiazine     REACTION: Hives     Current Outpatient Prescriptions on File Prior to Visit  Medication Sig Dispense Refill  . albuterol (PROVENTIL HFA;VENTOLIN HFA) 108 (90 BASE) MCG/ACT inhaler Inhale 2 puffs into the lungs every 6 (six) hours as needed for wheezing or shortness of breath.    Marland Kitchen atorvastatin (LIPITOR) 40 MG tablet Take 40 mg by mouth at bedtime.    . Cholecalciferol (VITAMIN D) 2000 UNITS CAPS Take 1 capsule by mouth daily.    . Coenzyme Q10 (COQ-10) 100 MG CAPS Take 1 capsule by mouth at bedtime.     . Cyanocobalamin (B-12 PO) Take 10,000 mcg by mouth every other day.     Marland Kitchen dexlansoprazole (DEXILANT) 60 MG capsule Take 60 mg by mouth daily.    . fenofibrate (TRICOR) 145 MG tablet TAKE 1 TABLET DAILY 90 tablet 2  . fexofenadine (ALLEGRA) 180 MG tablet Take 180 mg by mouth every evening.     . gabapentin (NEURONTIN) 100 MG capsule Take 100 mg by mouth 3 (three) times daily.    Marland Kitchen  guaiFENesin (MUCINEX) 600 MG 12 hr tablet Take 1 tablet (600 mg total) by mouth 2 (two) times daily. 20 tablet 0  . guaiFENesin-dextromethorphan (ROBITUSSIN DM) 100-10 MG/5ML syrup Take 5 mLs by mouth every 4 (four) hours as needed for cough. 118 mL 0  . Multiple Vitamins-Minerals (MULTIVITAMIN PO) Take by mouth daily.    Marland Kitchen oxybutynin (DITROPAN) 5 MG tablet Take 5 mg by mouth 2 (two) times daily.    Marland Kitchen oxyCODONE (OXY IR/ROXICODONE) 5 MG immediate release tablet Take 1 tablet (5 mg total) by mouth every 4 (four) hours as needed for severe pain. 30 tablet 0  . senna-docusate (SENOKOT-S) 8.6-50 MG per tablet Take 1 tablet by mouth 2 (two) times daily as needed for mild constipation. 30 tablet 0  . venlafaxine (EFFEXOR) 75 MG tablet Take 75 mg by mouth daily.    Marland Kitchen warfarin (COUMADIN) 2.5 MG tablet Take 2.5 mg by mouth daily.     No current facility-administered medications on file prior to visit.     Past Medical History  Diagnosis Date  . GERD (gastroesophageal reflux disease)     h/o PUD  . CAD (coronary artery disease)     cath 2000 30% single vessel, normal nuclear stress test 12/03/2010, no evidence ischemia  . CKD (chronic kidney disease) stage 3, GFR 30-59 ml/min     baseline Cr 1.7  .  Depression   . Hx pulmonary embolism 08/1991    coumadin  . History of phlebitis   . Allergic rhinitis   . Urge incontinence   . Elevated PSA     previous-normalized (followed by Dr. Jeffie Pollock, rec no repeat unless urinary sxs)  . HLD (hyperlipidemia)     hypertriglyceridemia  . Asthma     remote  . Arthritis   . Blood transfusion 1990's  . Schatzki's ring 02/2012    s/p dilation [name]  . History of fracture of right hip   . Hearing loss 06/2012    eval - rec annual exam  . Essential tremor 09/27/2008  . Prediabetes   . DISH (diffuse idiopathic skeletal hyperostosis) 03/2013    lumbar spine on xray  . DDD (degenerative disc disease) 03/2013    by CT, diffuse multilevel cervical and lumbar  spondylosis  . Osteoporosis 11/2013    DEXA T score -2.7  . Macrocytic anemia     stable B12/folate and periph smear 05/2013  . Sleep apnea     no CPAP- sleep apnea "cleared up 15 years ago"  . Dizziness     multifactorial, s/p PT at [facility] 06/2014 with HEP  . History of pyelonephritis 05/2014    hospitalization with sepsis  . Pulmonary emboli      Past Surgical History  Procedure Laterality Date  . Tonsillectomy  1965  . Hernia repair  1979    Left  . Cystoscopy  1992    for kidney stones  . Exploratory laparotomy  1996    with incidental appendectomy  . Appendectomy  1996  . Knee arthroscopy  03/24/02    Right (Dr. Mauri Pole)  . Orif femoral neck fracture w/ dhs  08/03/02    Right (Dr. Mauri Pole)  . Ct abd w & pelvis wo cm  07/2001    Scarring of right lung, stable negative o/w  . Ct abd w & pelvis wo cm  11/2000    ? stones, right LL scarring  . V/q scan  06/1999    negative  . [place]  [place]    [place]  . US echocardiography  12/28/2007    Mild aortic valve calcification EF 55%, basically nml  . Carotid u/s  12/28/2007    nml  . Eeg  11/12/2009    nml  . Mri  11/2009    Head, nml  . Cataract extraction  Jan, March 2011    bilateral  . Colonoscopy  07/2010    5 polyps, adenomatous, rec rpt 3 yrs  . Esophagogastroduodenoscopy  02/2012    dilation of schatzki's ring [name]  . Upper gastrointestinal endoscopy    . Colonoscopy  04/2014    3 polyps, adenomatous, f/u open ended given age [name]  . Cardiac catheterization  2000    30% one vessel     Family History  Problem Relation Age of Onset  . Stroke Mother   . Hypertension Mother   . Cancer Brother     prostate with mets  . Blindness Brother     legally  . Diabetes Brother   . Pulmonary embolism Sister     from shoulder operation  . Alcohol abuse Brother   . Colon cancer Neg Hx   . Esophageal cancer Neg Hx   . Rectal cancer Neg Hx   . Stomach cancer Neg Hx      History   Social History  .  Marital Status: Married    Spouse Name:  N/A  . Number of Children: 2  . Years of Education: N/A   Occupational History  . 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 Smoking status: Never Smoker   . Smokeless tobacco: Never Used  . Alcohol Use: No  . Drug Use: No  . Sexual Activity: Not on file   Other Topics Concern  . Not on file   Social History Narrative   Married with 2 children   Husband of Anden Bartolo    Retired: was principal    Activity: walks dog 2-3 times daily about 74min, frequent stops    Diet: some water, good fruits/vegetables, fish 1x/wk, no sodas.      Advanced directives: Chauncey Reading is wife, [name]. Does not want prolonged life support if terminal     ROS A 10 point review of system was performed. It is negative other than that mentioned in the history of present illness.   PHYSICAL EXAM   BP 126/71 mmHg  Pulse 102  Ht 5\' 8"  (1.727 m)  Wt 163 lb (73.936 kg)  BMI 24.79 kg/m2 Constitutional: He is not oriented to person, place, and time. He appears well-developed and well-nourished. No distress.  HENT: No nasal discharge.  Head: Normocephalic and atraumatic.  Eyes: Pupils are equal and round.  No discharge. Neck: Normal range of motion. Neck supple. No JVD present. No thyromegaly present.  Cardiovascular: Normal rate, regular rhythm, normal heart sounds. Exam reveals no gallop and no friction rub. No murmur heard.  Pulmonary/Chest: Effort normal and breath sounds normal. No stridor. No respiratory distress. He has no wheezes. He has no rales. He exhibits no tenderness.  Abdominal: Soft. Bowel sounds are normal. He exhibits no distension. There is no tenderness. There is no rebound and no guarding.  Musculoskeletal: Normal range of motion. He exhibits no edema and no tenderness.  Neurological: He is alert and oriented to person, place, and time. Coordination normal.  Skin: Skin is warm and dry. No rash noted. He is  not diaphoretic. No erythema. No pallor.  Psychiatric: He has a normal mood and affect. His behavior is normal. Judgment and thought content normal.       [id number]  Tachycardia  Low voltage in precordial leads.   -Left axis.   -Poor R-wave progression -may be secondary to pulmonary disease   consider old anterior infarct.   ABNORMAL    ASSESSMENT AND PLAN

## 2014-11-24 NOTE — Patient Instructions (Addendum)
Your physician has recommended that you wear a holter monitor. Holter monitors are medical devices that record the heart's electrical activity. Doctors most often use these monitors to diagnose arrhythmias. Arrhythmias are problems with the speed or rhythm of the heartbeat. The monitor is a small, portable device. You can wear one while you do your normal daily activities. This is usually used to diagnose what is causing palpitations/syncope (passing out).  Please wear thigh-high compression stockings during the day only  Please increase your fluid intake  Call or return to clinic prn if these symptoms worsen or fail to improve as anticipated.

## 2014-11-27 ENCOUNTER — Non-Acute Institutional Stay (SKILLED_NURSING_FACILITY): Payer: Medicare Other | Admitting: Registered Nurse

## 2014-11-27 ENCOUNTER — Other Ambulatory Visit: Payer: Medicare Other

## 2014-11-27 DIAGNOSIS — I2699 Other pulmonary embolism without acute cor pulmonale: Secondary | ICD-10-CM

## 2014-11-27 DIAGNOSIS — T81718A Complication of other artery following a procedure, not elsewhere classified, initial encounter: Secondary | ICD-10-CM | POA: Diagnosis not present

## 2014-11-27 DIAGNOSIS — R791 Abnormal coagulation profile: Secondary | ICD-10-CM

## 2014-11-27 LAB — POCT INR: INR: 1.9 — AB (ref 0.9–1.1)

## 2014-11-27 NOTE — Progress Notes (Signed)
Patient ID: [name], male   DOB: [birth year], 81 y.o.   MRN: [id number]   Place of Service: [facility]  Allergies  Allergen Reactions  . Lactose Intolerance (Gi)   . Nizatidine     REACTION: Rash (Axid)  . Penicillins     REACTION: Rash  . Sulfadiazine     REACTION: Hives    Code Status: Full Code  Goals of Care: Longevity/STR  Chief Complaint  Patient presents with  . Acute Visit    subtherapeutic inr    HPI  81 y.o. male with PMH of CAD, deprssion, HLD, dementia, remote asthma among others is being seen  is being seen for management of warfarin therapy. Patient is on long-term warfarin anticoagulation for pulmonary embolism. Goal INR is 2 to 3. Today INR is 1.9 and current warfarin dose is 2.5mg  daily. No missed or extra dose reported. No recent change in medications or diet reported. No upcoming procedure. Seen in room today. Denies any concerns.   Review of Systems Constitutional: Negative for fever and chills. Cardiovascular: Negative for chest pain, palpitations, and leg swelling Respiratory: Negative cough and shortness of breath Gastrointestinal: Negative for nausea and vomiting. Negative for abdominal pain. Negative for bloody stool.   Genitourinary: Negative for hematuria Musculoskeletal: Negative for back pain Neurological: Negative for dizziness and headache Skin: Negative for rash Psychiatric: Negative for depression  Past Medical History  Diagnosis Date  . GERD (gastroesophageal reflux disease)     h/o PUD  . CAD (coronary artery disease)     cath 2000 30% single vessel, normal nuclear stress test 12/03/2010, no evidence ischemia  . CKD (chronic kidney disease) stage 3, GFR 30-59 ml/min     baseline Cr 1.7  . Depression   . Hx pulmonary embolism 08/1991    coumadin  . History of phlebitis   . Allergic rhinitis   . Urge incontinence   . Elevated PSA     previous-normalized (followed by Dr. Jeffie Pollock, rec no repeat unless urinary sxs)    . HLD (hyperlipidemia)     hypertriglyceridemia  . Asthma     remote  . Arthritis   . Blood transfusion 1990's  . Schatzki's ring 02/2012    s/p dilation [name]  . History of fracture of right hip   . Hearing loss 06/2012    eval - rec annual exam  . Essential tremor 09/27/2008  . Prediabetes   . DISH (diffuse idiopathic skeletal hyperostosis) 03/2013    lumbar spine on xray  . DDD (degenerative disc disease) 03/2013    by CT, diffuse multilevel cervical and lumbar spondylosis  . Osteoporosis 11/2013    DEXA T score -2.7  . Macrocytic anemia     stable B12/folate and periph smear 05/2013  . Sleep apnea     no CPAP- sleep apnea "cleared up 15 years ago"  . Dizziness     multifactorial, s/p PT at [facility] 06/2014 with HEP  . History of pyelonephritis 05/2014    hospitalization with sepsis  . Pulmonary emboli     Past Surgical History  Procedure Laterality Date  . Tonsillectomy  1965  . Hernia repair  1979    Left  . Cystoscopy  1992    for kidney stones  . Exploratory laparotomy  1996    with incidental appendectomy  . Appendectomy  1996  . Knee arthroscopy  03/24/02    Right (Dr. Mauri Pole)  . Orif femoral neck fracture w/ dhs  08/03/02  Right (Dr. Mauri Pole)  . Ct abd w & pelvis wo cm  07/2001    Scarring of right lung, stable negative o/w  . Ct abd w & pelvis wo cm  11/2000    ? stones, right LL scarring  . V/q scan  06/1999    negative  . [place]oscopy  1998    [place]  . US echocardiography  12/28/2007    Mild aortic valve calcification EF 55%, basically nml  . Carotid u/s  12/28/2007    nml  . Eeg  11/12/2009    nml  . Mri  11/2009    Head, nml  . Cataract extraction  Jan, March 2011    bilateral  . Colonoscopy  07/2010    5 polyps, adenomatous, rec rpt 3 yrs  . Esophagogastroduodenoscopy  02/2012    dilation of schatzki's ring [name]  . Upper gastrointestinal endoscopy    . Colonoscopy  04/2014    3 polyps, adenomatous, f/u open ended given age [name]  .  Cardiac catheterization  2000    30% one vessel    History   Social History  . Marital Status: Married    Spouse Name: N/A  . Number of Children: 2  . Years of Education: N/A   Occupational History  . 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 Smoking status: Never Smoker   . Smokeless tobacco: Never Used  . Alcohol Use: No  . Drug Use: No  . Sexual Activity: Not on file   Other Topics Concern  . Not on file   Social History Narrative   Married with 2 children   Husband of Albie Arizpe    Retired: was principal    Activity: walks dog 2-3 times daily about 65min, frequent stops    Diet: some water, good fruits/vegetables, fish 1x/wk, no sodas.      Advanced directives: Chauncey Reading is wife, [name]. Does not want prolonged life support if terminal    Family History  Problem Relation Age of Onset  . Stroke Mother   . Hypertension Mother   . Cancer Brother     prostate with mets  . Blindness Brother     legally  . Diabetes Brother   . Pulmonary embolism Sister     from shoulder operation  . Alcohol abuse Brother   . Colon cancer Neg Hx   . Esophageal cancer Neg Hx   . Rectal cancer Neg Hx   . Stomach cancer Neg Hx       Medication List       This list is accurate as of: 11/27/14  2:04 PM.  Always use your most recent med list.               albuterol 108 (90 BASE) MCG/ACT inhaler  Commonly known as:  PROVENTIL HFA;VENTOLIN HFA  Inhale 2 puffs into the lungs every 6 (six) hours as needed for wheezing or shortness of breath.     atorvastatin 40 MG tablet  Commonly known as:  LIPITOR  Take 40 mg by mouth at bedtime.     B-12 PO  Take 10,000 mcg by mouth every other day.     CoQ-10 100 MG Caps  Take 1 capsule by mouth at bedtime.     DEXILANT 60 MG capsule  Generic drug:  dexlansoprazole  Take 60 mg by mouth daily.     fenofibrate 145 MG tablet  Commonly known as:  [employer]  TAKE 1 TABLET DAILY     fexofenadine  180 MG tablet  Commonly known as:  ALLEGRA  Take 180 mg by mouth every evening.     gabapentin 100 MG capsule  Commonly known as:  NEURONTIN  Take 100 mg by mouth 3 (three) times daily.     guaiFENesin 600 MG 12 hr tablet  Commonly known as:  MUCINEX  Take 1 tablet (600 mg total) by mouth 2 (two) times daily.     guaiFENesin-dextromethorphan 100-10 MG/5ML syrup  Commonly known as:  ROBITUSSIN DM  Take 5 mLs by mouth every 4 (four) hours as needed for cough.     MULTIVITAMIN PO  Take by mouth daily.     oxybutynin 5 MG tablet  Commonly known as:  DITROPAN  Take 5 mg by mouth 2 (two) times daily.     oxyCODONE 5 MG immediate release tablet  Commonly known as:  Oxy IR/ROXICODONE  Take 1 tablet (5 mg total) by mouth every 4 (four) hours as needed for severe pain.     senna-docusate 8.6-50 MG per tablet  Commonly known as:  Senokot-S  Take 1 tablet by mouth 2 (two) times daily as needed for mild constipation.     venlafaxine 75 MG tablet  Commonly known as:  EFFEXOR  Take 75 mg by mouth daily.     Vitamin D 2000 UNITS Caps  Take 1 capsule by mouth daily.     warfarin 2.5 MG tablet  Commonly known as:  COUMADIN  Take 2.5 mg by mouth daily.        Physical Exam  BP 99/59 mmHg  Pulse 78  Temp(Src) 98.9 F (37.2 C)  Resp 18  SpO2 97%  Constitutional: Frail elderly male in no acute distress. Conversant and pleasantly confused at times. Bil hand tremors noted.  HEENT: Normocephalic and atraumatic. PERRL. EOM intact. No scleral icterus. Oral mucosa moist. Posterior pharynx clear of any exudate or lesions.  Neck: Hard C-collar in place  Cardiac: Normal S1, S2. RRR without appreciable murmurs, rubs, or gallops. Distal pulses intact. Trace dependent edema.  Lungs: No respiratory distress. Breath sounds clear to to auscultation bilaterally, slightly diminished at RLL Abdomen: Audible bowel sounds in all quadrants. Soft, nontender, nondistended.  Musculoskeletal: able to  move all extremities. Generalized weakness present.  Skin: Warm and dry. No rash noted. No erythema.  Neurological: Alert and oriented to self  Psychiatric: Appropriate mood and affect.    Labs Reviewed  Lab Results  Component Value Date   INR 1.9* 11/27/2014   INR 1.8* 11/20/2014   INR 3.7* 11/17/2014    Assessment & Plan 1. Subtherapeutic international normalized ratio (INR) 2. EMBOLISM & INFARCTION, IATROGENIC PULMONARY INR 1.9 today. Continue coumadin at 2.5mg  daily. Recheck INR on 12/04/14. Continue to monitor his status  Family/Staff Communication Plan of care discussed with patient, wife, [name] and nursing supervisir. Patient, wife,  and nursing supervisor verbalized understanding and agree with plan of care. No additional questions or concerns reported.    Arthur Holms, MSN, AGNP-C [facility] [address] [place], [place] [address] [phone] [8am-5pm] After hours: [phone]

## 2014-11-29 DIAGNOSIS — S12601D Unspecified nondisplaced fracture of seventh cervical vertebra, subsequent encounter for fracture with routine healing: Secondary | ICD-10-CM | POA: Diagnosis not present

## 2014-11-30 ENCOUNTER — Encounter: Payer: Self-pay | Admitting: Registered Nurse

## 2014-11-30 ENCOUNTER — Non-Acute Institutional Stay (SKILLED_NURSING_FACILITY): Payer: Medicare Other | Admitting: Registered Nurse

## 2014-11-30 ENCOUNTER — Other Ambulatory Visit: Payer: Self-pay | Admitting: *Deleted

## 2014-11-30 DIAGNOSIS — Y92129 Unspecified place in nursing home as the place of occurrence of the external cause: Principal | ICD-10-CM

## 2014-11-30 DIAGNOSIS — S0992XA Unspecified injury of nose, initial encounter: Secondary | ICD-10-CM

## 2014-11-30 DIAGNOSIS — S0031XA Abrasion of nose, initial encounter: Secondary | ICD-10-CM

## 2014-11-30 DIAGNOSIS — W19XXXA Unspecified fall, initial encounter: Secondary | ICD-10-CM | POA: Diagnosis not present

## 2014-11-30 MED ORDER — OXYCODONE HCL 5 MG PO TABS: ORAL_TABLET | ORAL | Status: DC

## 2014-11-30 NOTE — Progress Notes (Signed)
Patient ID: [name], male   DOB: 11-Jun-1931, 81 y.o.   MRN: [id number]   Place of Service: [facility]  Allergies  Allergen Reactions  . Lactose Intolerance (Gi)   . Nizatidine     REACTION: Rash (Axid)  . Penicillins     REACTION: Rash  . Sulfadiazine     REACTION: Hives    Code Status: Full Code  Goals of Care: Longevity/STR  Chief Complaint  Patient presents with  . Acute Visit    fall    HPI  81 y.o. male with PMH of CAD, deprssion, HLD, dementia, remote asthma among others is being seen  is being seen for acute visit for a fall. Found patient prone on floor and alerted. Stated that he was trying to get up to go the bathroom by himself, loss his balance, and fell down. Reported having bilateral knee pain R>L and tenderness on forehead. He denies any headache, dizziness, blurry vision, loss of consciousness, shortness of breath, chest pain, n/v.    Past Medical History  Diagnosis Date  . GERD (gastroesophageal reflux disease)     h/o PUD  . CAD (coronary artery disease)     cath 2000 30% single vessel, normal nuclear stress test 12/03/2010, no evidence ischemia  . CKD (chronic kidney disease) stage 3, GFR 30-59 ml/min     baseline Cr 1.7  . Depression   . Hx pulmonary embolism 08/1991    coumadin  . History of phlebitis   . Allergic rhinitis   . Urge incontinence   . Elevated PSA     previous-normalized (followed by Dr. Jeffie Pollock, rec no repeat unless urinary sxs)  . HLD (hyperlipidemia)     hypertriglyceridemia  . Asthma     remote  . Arthritis   . Blood transfusion 1990's  . Schatzki's ring 02/2012    s/p dilation [name]  . History of fracture of right hip   . Hearing loss 06/2012    eval - rec annual exam  . Essential tremor 09/27/2008  . Prediabetes   . DISH (diffuse idiopathic skeletal hyperostosis) 03/2013    lumbar spine on xray  . DDD (degenerative disc disease) 03/2013    by CT, diffuse multilevel cervical and lumbar spondylosis    . Osteoporosis 11/2013    DEXA T score -2.7  . Macrocytic anemia     stable B12/folate and periph smear 05/2013  . Sleep apnea     no CPAP- sleep apnea "cleared up 15 years ago"  . Dizziness     multifactorial, s/p PT at [facility] 06/2014 with HEP  . History of pyelonephritis 05/2014    hospitalization with sepsis  . Pulmonary emboli     Past Surgical History  Procedure Laterality Date  . Tonsillectomy  1965  . Hernia repair  1979    Left  . Cystoscopy  1992    for kidney stones  . Exploratory laparotomy  1996    with incidental appendectomy  . Appendectomy  1996  . Knee arthroscopy  03/24/02    Right (Dr. Mauri Pole)  . Orif femoral neck fracture w/ dhs  08/03/02    Right (Dr. Mauri Pole)  . Ct abd w & pelvis wo cm  07/2001    Scarring of right lung, stable negative o/w  . Ct abd w & pelvis wo cm  11/2000    ? stones, right LL scarring  . V/q scan  06/1999    negative  . [place]  [place]  [place]  . US echocardiography  12/28/2007    Mild aortic valve calcification EF 55%, basically nml  . Carotid u/s  12/28/2007    nml  . Eeg  11/12/2009    nml  . Mri  11/2009    Head, nml  . Cataract extraction  Jan, March 2011    bilateral  . Colonoscopy  07/2010    5 polyps, adenomatous, rec rpt 3 yrs  . Esophagogastroduodenoscopy  02/2012    dilation of schatzki's ring [name]  . Upper gastrointestinal endoscopy    . Colonoscopy  04/2014    3 polyps, adenomatous, f/u open ended given age [name]  . Cardiac catheterization  2000    30% one vessel    History   Social History  . Marital Status: Married    Spouse Name: N/A  . Number of Children: 2  . Years of Education: N/A   Occupational History  . 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 Smoking status: Never Smoker   . Smokeless tobacco: Never Used  . Alcohol Use: No  . Drug Use: No  . Sexual Activity: Not on file   Other Topics Concern  . Not on file   Social History  Narrative   Married with 2 children   Husband of Chaney Maclaren    Retired: was principal    Activity: walks dog 2-3 times daily about 84min, frequent stops    Diet: some water, good fruits/vegetables, fish 1x/wk, no sodas.      Advanced directives: Chauncey Reading is wife, [name]. Does not want prolonged life support if terminal    Family History  Problem Relation Age of Onset  . Stroke Mother   . Hypertension Mother   . Cancer Brother     prostate with mets  . Blindness Brother     legally  . Diabetes Brother   . Pulmonary embolism Sister     from shoulder operation  . Alcohol abuse Brother   . Colon cancer Neg Hx   . Esophageal cancer Neg Hx   . Rectal cancer Neg Hx   . Stomach cancer Neg Hx       Medication List       This list is accurate as of: 11/30/14  3:27 PM.  Always use your most recent med list.               albuterol 108 (90 BASE) MCG/ACT inhaler  Commonly known as:  PROVENTIL HFA;VENTOLIN HFA  Inhale 2 puffs into the lungs every 6 (six) hours as needed for wheezing or shortness of breath.     atorvastatin 40 MG tablet  Commonly known as:  LIPITOR  Take 40 mg by mouth at bedtime.     B-12 PO  Take 10,000 mcg by mouth every other day.     CoQ-10 100 MG Caps  Take 1 capsule by mouth at bedtime.     DEXILANT 60 MG capsule  Generic drug:  dexlansoprazole  Take 60 mg by mouth daily.     fenofibrate 145 MG tablet  Commonly known as:  [employer]  TAKE 1 TABLET DAILY     fexofenadine 180 MG tablet  Commonly known as:  ALLEGRA  Take 180 mg by mouth every evening.     gabapentin 100 MG capsule  Commonly known as:  NEURONTIN  Take 100 mg by mouth 3 (three) times daily.     guaiFENesin 600 MG 12 hr tablet  Commonly known as:  MUCINEX  Take 1 tablet (600 mg total) by mouth 2 (two) times daily.     guaiFENesin-dextromethorphan 100-10 MG/5ML syrup  Commonly known as:  ROBITUSSIN DM  Take 5 mLs by mouth every 4 (four) hours as needed for cough.      MULTIVITAMIN PO  Take by mouth daily.     oxybutynin 5 MG tablet  Commonly known as:  DITROPAN  Take 5 mg by mouth 2 (two) times daily.     oxyCODONE 5 MG immediate release tablet  Commonly known as:  Oxy IR/ROXICODONE  Take one tablet by mouth every 4 hours as needed for pain     senna-docusate 8.6-50 MG per tablet  Commonly known as:  Senokot-S  Take 1 tablet by mouth 2 (two) times daily as needed for mild constipation.     venlafaxine 75 MG tablet  Commonly known as:  EFFEXOR  Take 75 mg by mouth daily.     Vitamin D 2000 UNITS Caps  Take 1 capsule by mouth daily.     warfarin 2.5 MG tablet  Commonly known as:  COUMADIN  Take 2.5 mg by mouth daily.        Physical Exam  BP 106/81 mmHg  Pulse 80  Temp(Src) 98.1 F (36.7 C)  Resp 21  SpO2 97%  Constitutional: Frail elderly male in no acute distress. Conversant and pleasant.  Bil hand tremors noted.  HEENT: Normocephalic and atraumatic. PERRL. EOM intact. Small laceration noted on nose bridge with scant amt of blood. Some erythema right above right orbit/mid forehead noted.  Neck: Soft C-collar in place  Cardiac: Normal S1, S2. RRR without appreciable murmurs, rubs, or gallops. Distal pulses intact. Trace dependent edema.  Lungs: No respiratory distress. Breath sounds clear to to auscultation bilaterally. Abdomen: Audible bowel sounds in all quadrants. Soft, nontender, nondistended.  Musculoskeletal: able to move all extremities. Some knee tenderness with ROM. Diffuse knee tenderness with palpation bilaterally.  Skin: Warm and dry. Bil knee erythematous L>R. Small skin tear noted on right hand Neurological: Alert and oriented to self. Cranial Nerve II-XII grossly intact. No focal deficit.  Psychiatric: Appropriate mood and affect.   Assessment & Plan 1. Fall at nursing home, initial encounter Post fall assessment completed. Small lacaration ~0.5cm nose bridge-most likely from his eyeglasses (glasses intact). Small  skin tear noted on Right arm. Some erythema to Bilateral knee and forehead. VS stable. Neuro exam unremarkable. Bacitracin applied to small laceration on nose bridge and skin tear. Continue neuro check and monitor VS every 2 hours x 24 hours. Continue to monitor his status.   Family/Staff Communication Plan of care discussed with patient, wife, [name] and Engineer, civil (consulting). Patient, wife,  and nursing staff verbalized understanding and agree with plan of care. No additional questions or concerns reported.    Arthur Holms, MSN, AGNP-C [facility] [address] [place],  [address] [phone] [8am-5pm] After hours: [phone]

## 2014-11-30 NOTE — Telephone Encounter (Signed)
[facility] 

## 2014-12-04 LAB — POCT INR: INR: 2.7 — AB (ref 0.9–1.1)

## 2014-12-05 ENCOUNTER — Telehealth: Payer: Self-pay | Admitting: *Deleted

## 2014-12-05 ENCOUNTER — Non-Acute Institutional Stay (SKILLED_NURSING_FACILITY): Payer: Medicare Other | Admitting: Registered Nurse

## 2014-12-05 DIAGNOSIS — F329 Major depressive disorder, single episode, unspecified: Secondary | ICD-10-CM

## 2014-12-05 DIAGNOSIS — F0390 Unspecified dementia without behavioral disturbance: Secondary | ICD-10-CM | POA: Diagnosis not present

## 2014-12-05 DIAGNOSIS — R32 Unspecified urinary incontinence: Secondary | ICD-10-CM

## 2014-12-05 DIAGNOSIS — J189 Pneumonia, unspecified organism: Secondary | ICD-10-CM | POA: Diagnosis not present

## 2014-12-05 DIAGNOSIS — R55 Syncope and collapse: Secondary | ICD-10-CM

## 2014-12-05 DIAGNOSIS — R296 Repeated falls: Secondary | ICD-10-CM

## 2014-12-05 DIAGNOSIS — G250 Essential tremor: Secondary | ICD-10-CM | POA: Diagnosis not present

## 2014-12-05 DIAGNOSIS — K219 Gastro-esophageal reflux disease without esophagitis: Secondary | ICD-10-CM | POA: Diagnosis not present

## 2014-12-05 DIAGNOSIS — N183 Chronic kidney disease, stage 3 unspecified: Secondary | ICD-10-CM

## 2014-12-05 DIAGNOSIS — S12600S Unspecified displaced fracture of seventh cervical vertebra, sequela: Secondary | ICD-10-CM

## 2014-12-05 DIAGNOSIS — M6281 Muscle weakness (generalized): Secondary | ICD-10-CM | POA: Diagnosis not present

## 2014-12-05 DIAGNOSIS — F32A Depression, unspecified: Secondary | ICD-10-CM

## 2014-12-05 DIAGNOSIS — E785 Hyperlipidemia, unspecified: Secondary | ICD-10-CM

## 2014-12-05 NOTE — Telephone Encounter (Signed)
PA from [facility] called stating they have not received the monitor.  Pt is leaving home health Thursday and would like to let us know if we can get it to them by then then this would be ideal If the monitor can't then we need to send it to the pt home address.   Please call her back if can't send it to the nursing home.

## 2014-12-05 NOTE — Telephone Encounter (Signed)
Tried to contact Maudie Mercury with [facility] to verify that the holter monitor is placed with [facility] and will not be sent to the home.

## 2014-12-05 NOTE — Progress Notes (Signed)
Patient ID: EMILY FORSE, male   DOB: [birth year], 81 y.o.   MRN: [id number]   Place of Service: [facility]  Allergies  Allergen Reactions  . Lactose Intolerance (Gi)   . Nizatidine     REACTION: Rash (Axid)  . Penicillins     REACTION: Rash  . Sulfadiazine     REACTION: Hives    Code Status: Full Code  Goals of Care: Longevity/STR  Chief Complaint  Patient presents with  . Discharge Note    HPI 81 y.o. male with PMH of CAD, PE on chronic oral anticoagulation with coumadin, deprssion, HLD, dementia, remote asthma among others is being seen for a discharge visit. He was here for short term rehabilitation post hospital admission from 11/06/14 to 11/09/14 for C7 vertebral fracture s/p unwitnessed fall, questionable syncope, and HCAP. He has worked well with therapy and is ready to be discharged home with [facility] PT/OT/RN and DME (FWW, WC w/ cushion, and 3-1). Seen in room today. Denies any concerns.   Review of Systems Constitutional: Negative for fever and chills HENT: Negative for ear pain and sore throat Eyes: Negative for eye pain and eye discharge Cardiovascular: Negative for chest pain, palpitations. Positive for leg swelling Respiratory: Negative cough, shortness of breath, and wheezing.  Gastrointestinal: Negative for nausea and vomiting. Negative for abdominal pain, diarrhea and constipation.  Genitourinary: Negative for dysuria Musculoskeletal: Negative for back pain Neurological: Negative for dizziness and headache. Positive for tremors  Skin: Negative for rash Psychiatric: Negative for depression  Past Medical History  Diagnosis Date  . GERD (gastroesophageal reflux disease)     h/o PUD  . CAD (coronary artery disease)     cath 2000 30% single vessel, normal nuclear stress test 12/03/2010, no evidence ischemia  . CKD (chronic kidney disease) stage 3, GFR 30-59 ml/min     baseline Cr 1.7  . Depression   . Hx pulmonary embolism 08/1991    coumadin  .  History of phlebitis   . Allergic rhinitis   . Urge incontinence   . Elevated PSA     previous-normalized (followed by Dr. Jeffie Pollock, rec no repeat unless urinary sxs)  . HLD (hyperlipidemia)     hypertriglyceridemia  . Asthma     remote  . Arthritis   . Blood transfusion 1990's  . Schatzki's ring 02/2012    s/p dilation [name]  . History of fracture of right hip   . Hearing loss 06/2012    eval - rec annual exam  . Essential tremor 09/27/2008  . Prediabetes   . DISH (diffuse idiopathic skeletal hyperostosis) 03/2013    lumbar spine on xray  . DDD (degenerative disc disease) 03/2013    by CT, diffuse multilevel cervical and lumbar spondylosis  . Osteoporosis 11/2013    DEXA T score -2.7  . Macrocytic anemia     stable B12/folate and periph smear 05/2013  . Sleep apnea     no CPAP- sleep apnea "cleared up 15 years ago"  . Dizziness     multifactorial, s/p PT at [facility] 06/2014 with HEP  . History of pyelonephritis 05/2014    hospitalization with sepsis  . Pulmonary emboli     Past Surgical History  Procedure Laterality Date  . Tonsillectomy  1965  . Hernia repair  1979    Left  . Cystoscopy  1992    for kidney stones  . Exploratory laparotomy  1996    with incidental appendectomy  . Appendectomy  1996  .  Knee arthroscopy  03/24/02    Right (Dr. Mauri Pole)  . Orif femoral neck fracture w/ dhs  08/03/02    Right (Dr. Mauri Pole)  . Ct abd w & pelvis wo cm  07/2001    Scarring of right lung, stable negative o/w  . Ct abd w & pelvis wo cm  11/2000    ? stones, right LL scarring  . V/q scan  06/1999    negative  . [place]oscopy  1998    [place]  . US echocardiography  12/28/2007    Mild aortic valve calcification EF 55%, basically nml  . Carotid u/s  12/28/2007    nml  . Eeg  11/12/2009    nml  . Mri  11/2009    Head, nml  . Cataract extraction  Jan, March 2011    bilateral  . Colonoscopy  07/2010    5 polyps, adenomatous, rec rpt 3 yrs  . Esophagogastroduodenoscopy  02/2012      dilation of schatzki's ring [name]  . Upper gastrointestinal endoscopy    . Colonoscopy  04/2014    3 polyps, adenomatous, f/u open ended given age [name]  . Cardiac catheterization  2000    30% one vessel    History  Substance Use Topics  . Smoking status: Never Smoker   . Smokeless tobacco: Never Used  . Alcohol Use: No    Family History  Problem Relation Age of Onset  . Stroke Mother   . Hypertension Mother   . Cancer Brother     prostate with mets  . Blindness Brother     legally  . Diabetes Brother   . Pulmonary embolism Sister     from shoulder operation  . Alcohol abuse Brother   . Colon cancer Neg Hx   . Esophageal cancer Neg Hx   . Rectal cancer Neg Hx   . Stomach cancer Neg Hx       Medication List       This list is accurate as of: 12/05/14 10:24 AM.  Always use your most recent med list.               albuterol 108 (90 BASE) MCG/ACT inhaler  Commonly known as:  PROVENTIL HFA;VENTOLIN HFA  Inhale 2 puffs into the lungs every 6 (six) hours as needed for wheezing or shortness of breath.     atorvastatin 40 MG tablet  Commonly known as:  LIPITOR  Take 40 mg by mouth at bedtime.     B-12 PO  Take 10,000 mcg by mouth every other day.     CoQ-10 100 MG Caps  Take 1 capsule by mouth at bedtime.     DEXILANT 60 MG capsule  Generic drug:  dexlansoprazole  Take 60 mg by mouth daily.     fenofibrate 145 MG tablet  Commonly known as:  TRICOR  TAKE 1 TABLET DAILY     fexofenadine 180 MG tablet  Commonly known as:  ALLEGRA  Take 180 mg by mouth every evening.     gabapentin 100 MG capsule  Commonly known as:  NEURONTIN  Take 100 mg by mouth 3 (three) times daily.     guaiFENesin 600 MG 12 hr tablet  Commonly known as:  MUCINEX  Take 1 tablet (600 mg total) by mouth 2 (two) times daily.     guaiFENesin-dextromethorphan 100-10 MG/5ML syrup  Commonly known as:  ROBITUSSIN DM  Take 5 mLs by mouth every 4 (four) hours as needed for  cough.      MULTIVITAMIN PO  Take by mouth daily.     oxybutynin 5 MG tablet  Commonly known as:  DITROPAN  Take 5 mg by mouth daily.     oxyCODONE 5 MG immediate release tablet  Commonly known as:  Oxy IR/ROXICODONE  Take one tablet by mouth every 4 hours as needed for pain     senna-docusate 8.6-50 MG per tablet  Commonly known as:  Senokot-S  Take 1 tablet by mouth 2 (two) times daily as needed for mild constipation.     venlafaxine 75 MG tablet  Commonly known as:  EFFEXOR  Take 75 mg by mouth daily.     Vitamin D 2000 UNITS Caps  Take 1 capsule by mouth daily.     warfarin 2.5 MG tablet  Commonly known as:  COUMADIN  Take 2.5 mg by mouth daily.        Physical Exam  BP 120/63 mmHg  Pulse 92  Temp(Src) 97.3 F (36.3 C)  Resp 19  Ht 5\' 8"  (1.727 m)  Wt 148 lb 12.8 oz (67.495 kg)  BMI 22.63 kg/m2  SpO2 98%  Constitutional: Frail elderly male in no acute distress. Conversant and pleasantly confused Bil hand tremors noted.  HEENT: Normocephalic and atraumatic. PERRL. EOM intact. No scleral icterus. No nasal discharge or sinus tenderness. Oral mucosa moist. Posterior pharynx clear of any exudate or lesions.  Neck: soft C-collar in place Cardiac: Normal S1, S2. RRR without appreciable murmurs, rubs, or gallops. Distal pulses intact. Trace dependent edema.  Lungs: No respiratory distress. Breath sounds clear bilaterally without rales, rhonchi, or wheezes. Abdomen: Audible bowel sounds in all quadrants. Soft, nontender, nondistended.  Musculoskeletal: able to move all extremities. Unsteady gait. Generalized weakness present.  Skin: Warm and dry. No rash noted. No erythema.  Neurological: Alert and oriented to self Psychiatric:  Appropriate mood and affect.   Labs Reviewed  CBC Latest Ref Rng 11/17/2014 11/09/2014 11/07/2014  WBC - 6.8 8.7 7.9  Hemoglobin 13.5 - 17.5 g/dL 10.7(A) 10.4(L) 11.1(L)  Hematocrit 41 - 53 % 31(A) 30.5(L) 34.0(L)  Platelets 150 - 399 K/L 247  130(L) 93(L)    CMP Latest Ref Rng 11/17/2014 11/09/2014 11/08/2014  Glucose 70 - 99 mg/dL - 134(H) 124(H)  BUN 4 - 21 mg/dL 46(A) 23 25(H)  Creatinine 0.6 - 1.3 mg/dL 1.7(A) 1.31 1.62(H)  Sodium 137 - 147 mmol/L 137 139 138  Potassium 3.4 - 5.3 mmol/L 4.4 4.5 4.4  Chloride 96 - 112 mmol/L - 104 108  CO2 19 - 32 mmol/L - 27 25  Calcium 8.4 - 10.5 mg/dL - 8.8 8.7  Total Protein 6.0 - 8.3 g/dL - 5.4(L) 5.6(L)  Total Bilirubin 0.3 - 1.2 mg/dL - 1.6(H) 1.3(H)  Alkaline Phos 39 - 117 U/L - 35(L) 34(L)  AST 0 - 37 U/L - 64(H) 68(H)  ALT 0 - 53 U/L - 39 48    Lipid Panel     Component Value Date/Time   CHOL 139 10/30/2014 0940   TRIG 183.0* 10/30/2014 0940   HDL 25.50* 10/30/2014 0940   CHOLHDL 5 10/30/2014 0940   VLDL 36.6 10/30/2014 0940   LDLCALC 77 10/30/2014 0940   LDLDIRECT 82.6 11/26/2009 0922   Diagnostic Studies Reviewed 11/07/14: 2D Echo -Left ventricle: The cavity size was normal. Wall thickness was normal. Systolic function was vigorous. The estimated ejectionfraction was in the range of 65% to 70%. Wall motion was normal; there were no regional wall motion abnormalities.  Doppler parameters are consistent with abnormal left ventricular relaxation (grade 1 diastolic dysfunction). The E/e&' ratio isbetween 8-15, suggesting indeterminate LV filling pressure. - Aortic valve: Trileaflet. Sclerosis without stenosis. There was trivial to mild regurgitation. - Mitral valve: Calcified annulus. - Left atrium: The atrium was normal in size.  11/07/14: CXR Increasing bibasilar airspace opacities are concerning for multifocal pneumonia. Small left pleural effusion noted.  11/06/14: CT cervical spine 1. No intracranial trauma. 2. Atrophy and microvascular disease unchanged. 3. Acute fracture of the C7 vertebral body with mild compression of the superior and inferior endplates. 4. Bulky osteophytosis from C to through C7 anteriorly.  Assessment & Plan 1. EMBOLISM & INFARCTION, IATROGENIC  PULMONARY On chronic oral anticoagulation with coumadin. INR 2.7 on 12/04/14. Continue coumadin 2.5mg  daily. Will be discharged home with [facility] RN for INR monitoring. Next INR check 12/11/14. Continue to f/u with PCP  2. Syncope, unspecified syncope type Has unwitnessed fall. Questionable syncope. EF 65-70% with grade 1 diastolic dysfunction on recent 2D Echo.Negative serial cardiac enzymes. Pending 48 hrs outpatient Holter monitoring. Continue to f/u with cardiology (Holter monitor has not arrived from [place] called and asked to have Holter monitor mail to patient's address if they're unable to deliver it before his discharge)  3. Frequent falls R/t to generalized weakness/unsteady gait. Continue to work with [facility] PT/OT for gait/strength/balance training to restore/maximize function. Fall risk precautions  4. Essential tremor Continue gabapentin 100mg  twice daily  5. Dementia, without behavioral disturbance Persists. Decline is anticipated. Fall and pressure ulcer precautions.   6. CKD (chronic kidney disease) stage 3, GFR 30-59 ml/min Avoid nephrotoxic agents, especially NSAIDs. PCP to monitor renal function  7. Depression Mood stable. Continue effexor 75mg  daily.   8. C7 cervical fracture, sequela With mild compression of the superior and inferior endplate. Soft C-collar in place. Continue to f/u with neurosurgery as scheduled.   9. Generalized muscle weakness Continue to work with PT/OT for gait/strenghth/balance training to restore/maximize functions. Fall risk precautions.   10. HCAP (healthcare-associated pneumonia) Completed his abx. Denies any respiratory distress. Lung exam normal. Continue albuterol every 6 hours as needed for shortness of breath and wheezing.   11. GERD Continue Dexilant 60mg  daily and monitor  12. HLD LDL 77. Continue lipitor 40mg  daily and tricor 145mg  daily  Home health services:PT/OT/RN DME required: [facility], 3-1, WC w/ cushion PCP follow-up: Dr.  Ria Bush on 12/15/14 @2pm  30-day supply of prescription medications provided (#30 oxycodone IR 5mg )   Time spent: 40 minutes on care coordination   Family/Staff Communication Plan of care discussed with spouse, Occupational hygienist and nursing staff. Spouse and nursing staff verbalized understanding and agree with plan of care. No additional questions or concerns reported.    Arthur Holms, MSN, AGNP-C [facility] [address] [place], [place] [address] [phone] [8am-5pm] After hours: [phone]

## 2014-12-06 NOTE — Telephone Encounter (Signed)
Spoke with Sherlon Handing with [facility] regarding the monitor, I explained to her that [facility] will contact the patient to schedule a good day and time to have the monitor placed.

## 2014-12-09 DIAGNOSIS — R269 Unspecified abnormalities of gait and mobility: Secondary | ICD-10-CM | POA: Diagnosis not present

## 2014-12-09 DIAGNOSIS — E785 Hyperlipidemia, unspecified: Secondary | ICD-10-CM | POA: Diagnosis not present

## 2014-12-09 DIAGNOSIS — F0390 Unspecified dementia without behavioral disturbance: Secondary | ICD-10-CM | POA: Diagnosis not present

## 2014-12-09 DIAGNOSIS — F329 Major depressive disorder, single episode, unspecified: Secondary | ICD-10-CM | POA: Diagnosis not present

## 2014-12-09 DIAGNOSIS — I2699 Other pulmonary embolism without acute cor pulmonale: Secondary | ICD-10-CM | POA: Diagnosis not present

## 2014-12-09 DIAGNOSIS — G629 Polyneuropathy, unspecified: Secondary | ICD-10-CM | POA: Diagnosis not present

## 2014-12-09 DIAGNOSIS — S12600D Unspecified displaced fracture of seventh cervical vertebra, subsequent encounter for fracture with routine healing: Secondary | ICD-10-CM | POA: Diagnosis not present

## 2014-12-09 DIAGNOSIS — Z8701 Personal history of pneumonia (recurrent): Secondary | ICD-10-CM | POA: Diagnosis not present

## 2014-12-09 DIAGNOSIS — E119 Type 2 diabetes mellitus without complications: Secondary | ICD-10-CM | POA: Diagnosis not present

## 2014-12-09 DIAGNOSIS — Z7901 Long term (current) use of anticoagulants: Secondary | ICD-10-CM | POA: Diagnosis not present

## 2014-12-09 DIAGNOSIS — N183 Chronic kidney disease, stage 3 (moderate): Secondary | ICD-10-CM | POA: Diagnosis not present

## 2014-12-09 DIAGNOSIS — R531 Weakness: Secondary | ICD-10-CM | POA: Diagnosis not present

## 2014-12-12 ENCOUNTER — Telehealth: Payer: Self-pay | Admitting: *Deleted

## 2014-12-12 DIAGNOSIS — E119 Type 2 diabetes mellitus without complications: Secondary | ICD-10-CM | POA: Diagnosis not present

## 2014-12-12 DIAGNOSIS — S12600D Unspecified displaced fracture of seventh cervical vertebra, subsequent encounter for fracture with routine healing: Secondary | ICD-10-CM | POA: Diagnosis not present

## 2014-12-12 DIAGNOSIS — N183 Chronic kidney disease, stage 3 (moderate): Secondary | ICD-10-CM | POA: Diagnosis not present

## 2014-12-12 DIAGNOSIS — G629 Polyneuropathy, unspecified: Secondary | ICD-10-CM | POA: Diagnosis not present

## 2014-12-12 DIAGNOSIS — R269 Unspecified abnormalities of gait and mobility: Secondary | ICD-10-CM | POA: Diagnosis not present

## 2014-12-12 DIAGNOSIS — I2699 Other pulmonary embolism without acute cor pulmonale: Secondary | ICD-10-CM | POA: Diagnosis not present

## 2014-12-12 NOTE — Telephone Encounter (Signed)
Stable. Recheck in 3 wks.

## 2014-12-12 NOTE — Telephone Encounter (Addendum)
Debbie from [facility] called. PT 33.4 and INR 2.8. He has follow scheduled with you on 12/15/14. She just needs to know when to recheck him. [phone]

## 2014-12-13 NOTE — Telephone Encounter (Signed)
Debbie notified. 

## 2014-12-14 DIAGNOSIS — R269 Unspecified abnormalities of gait and mobility: Secondary | ICD-10-CM | POA: Diagnosis not present

## 2014-12-14 DIAGNOSIS — N183 Chronic kidney disease, stage 3 (moderate): Secondary | ICD-10-CM | POA: Diagnosis not present

## 2014-12-14 DIAGNOSIS — G629 Polyneuropathy, unspecified: Secondary | ICD-10-CM | POA: Diagnosis not present

## 2014-12-14 DIAGNOSIS — I2699 Other pulmonary embolism without acute cor pulmonale: Secondary | ICD-10-CM | POA: Diagnosis not present

## 2014-12-14 DIAGNOSIS — S12600D Unspecified displaced fracture of seventh cervical vertebra, subsequent encounter for fracture with routine healing: Secondary | ICD-10-CM | POA: Diagnosis not present

## 2014-12-14 DIAGNOSIS — E119 Type 2 diabetes mellitus without complications: Secondary | ICD-10-CM | POA: Diagnosis not present

## 2014-12-15 ENCOUNTER — Telehealth: Payer: Self-pay | Admitting: Family Medicine

## 2014-12-15 ENCOUNTER — Encounter: Payer: Self-pay | Admitting: Family Medicine

## 2014-12-15 ENCOUNTER — Ambulatory Visit (INDEPENDENT_AMBULATORY_CARE_PROVIDER_SITE_OTHER): Payer: Medicare Other | Admitting: Family Medicine

## 2014-12-15 VITALS — BP 124/60 | HR 88 | Temp 97.6°F | Wt 153.8 lb

## 2014-12-15 DIAGNOSIS — N183 Chronic kidney disease, stage 3 unspecified: Secondary | ICD-10-CM

## 2014-12-15 DIAGNOSIS — I2510 Atherosclerotic heart disease of native coronary artery without angina pectoris: Secondary | ICD-10-CM

## 2014-12-15 DIAGNOSIS — R300 Dysuria: Secondary | ICD-10-CM | POA: Diagnosis not present

## 2014-12-15 DIAGNOSIS — S12690S Other displaced fracture of seventh cervical vertebra, sequela: Secondary | ICD-10-CM | POA: Diagnosis not present

## 2014-12-15 DIAGNOSIS — D539 Nutritional anemia, unspecified: Secondary | ICD-10-CM

## 2014-12-15 DIAGNOSIS — R269 Unspecified abnormalities of gait and mobility: Secondary | ICD-10-CM

## 2014-12-15 DIAGNOSIS — Z7189 Other specified counseling: Secondary | ICD-10-CM

## 2014-12-15 DIAGNOSIS — S12600D Unspecified displaced fracture of seventh cervical vertebra, subsequent encounter for fracture with routine healing: Secondary | ICD-10-CM | POA: Diagnosis not present

## 2014-12-15 DIAGNOSIS — G629 Polyneuropathy, unspecified: Secondary | ICD-10-CM | POA: Diagnosis not present

## 2014-12-15 DIAGNOSIS — Z86711 Personal history of pulmonary embolism: Secondary | ICD-10-CM

## 2014-12-15 DIAGNOSIS — E119 Type 2 diabetes mellitus without complications: Secondary | ICD-10-CM | POA: Diagnosis not present

## 2014-12-15 DIAGNOSIS — I2699 Other pulmonary embolism without acute cor pulmonale: Secondary | ICD-10-CM | POA: Diagnosis not present

## 2014-12-15 DIAGNOSIS — F0390 Unspecified dementia without behavioral disturbance: Secondary | ICD-10-CM

## 2014-12-15 DIAGNOSIS — R55 Syncope and collapse: Secondary | ICD-10-CM

## 2014-12-15 LAB — POCT URINALYSIS DIPSTICK
BILIRUBIN UA: NEGATIVE
GLUCOSE UA: NEGATIVE
Ketones, UA: NEGATIVE
NITRITE UA: NEGATIVE
PROTEIN UA: NEGATIVE
RBC UA: NEGATIVE
Spec Grav, UA: 1.015
UROBILINOGEN UA: 0.2
pH, UA: 7

## 2014-12-15 NOTE — Telephone Encounter (Signed)
Ok to give verbal ok?

## 2014-12-15 NOTE — Telephone Encounter (Signed)
Freda Munro called to get a verbal order for Social work

## 2014-12-15 NOTE — Telephone Encounter (Signed)
Sheila notified.

## 2014-12-15 NOTE — Patient Instructions (Addendum)
Let's check urine today. Blood work today. Let's stop coumadin. Return in 2 weeks for lab visit only for hypercoagulability evaluation.  Return to see me in 1 month for follow up.

## 2014-12-15 NOTE — Progress Notes (Signed)
Pre visit review using our clinic review tool, if applicable. No additional management support is needed unless otherwise documented below in the visit note. 

## 2014-12-15 NOTE — Progress Notes (Addendum)
BP 124/60 mmHg  Pulse 88  Temp(Src) 97.6 F (36.4 C) (Oral)  Wt 153 lb 12 oz (69.741 kg)   CC: hosp and rehab f/u visit  Subjective:    Patient ID: [name], male    DOB: [birth year], 81 y.o.   MRN: [id number]  HPI: [name] is a 81 y.o. male presenting on 12/15/2014 for Follow-up   Recent hospitalization after fall backwards with C7 vertebral fracture, ?syncope and HCAP (now resolved). Prior to hospitalization we were also evaluating for developing dementia.   Hospital records reviewed - 2D echo stable with aortic sclerosis without stenosis. For ?syncope, he has pending 48 holter monitor to be placed at [facility] next week.  C7 fracture - s/p conservative treatment. Wore hard collar for 4 weeks and soft collar for 2 weeks. Released from neurosurgery care.   Weight loss noted. Liberalizing diet. Started 1-2 boost daily.  Dysuria - persistent stinging and burning along with trouble starting stream ongoing for last 2+ wks. Nocturia x3. Uses depens.   Intermittent constipation. Treating with metamucil and colace daily.   HHPT twice weekly. RN once weekly. OT should be coming out to house.   Dementia - recent dx with MMSE 19-23/30. Difficulty with calculation, recall, language. ?alz vs parkisonisms vs vascular dementia although recent CT without evidence of previous stroke. Not on meds. Describes imbalance, incontinence, and worsening memory. No significant confusion other than short period while in nursing home. Wife brings general power of attorney form which has been filled out giving wife power to act on his behalf.  Chronically anticoagulated over last 24 years due to 1 unprovoked pulm embolism, stayed on coumadin chronically 2/2 strong family history of blood clot (sister). He has seen hematologist in the past but more for chronic anemia workup. I have no records available and will request paper chart pulled. Pt will bring old records he has at home as well.  Relevant  past medical, surgical, family and social history reviewed and updated as indicated. Interim medical history since our last visit reviewed. Allergies and medications reviewed and updated. Current Outpatient Prescriptions on File Prior to Visit  Medication Sig  . albuterol (PROVENTIL HFA;VENTOLIN HFA) 108 (90 BASE) MCG/ACT inhaler Inhale 2 puffs into the lungs every 6 (six) hours as needed for wheezing or shortness of breath.  Marland Kitchen atorvastatin (LIPITOR) 40 MG tablet Take 40 mg by mouth at bedtime.  . Cholecalciferol (VITAMIN D) 2000 UNITS CAPS Take 1 capsule by mouth daily.  . Coenzyme Q10 (COQ-10) 100 MG CAPS Take 1 capsule by mouth at bedtime.   . Cyanocobalamin (B-12 PO) Take 10,000 mcg by mouth every other day.   Marland Kitchen dexlansoprazole (DEXILANT) 60 MG capsule Take 60 mg by mouth daily.  . fenofibrate (TRICOR) 145 MG tablet TAKE 1 TABLET DAILY  . fexofenadine (ALLEGRA) 180 MG tablet Take 180 mg by mouth every evening.   . gabapentin (NEURONTIN) 100 MG capsule Take 100 mg by mouth as directed. $RemoveBefo'200mg'SgQXNyAiUOr$  in am and $Remo'100mg'EjUku$  in pm  . guaiFENesin (MUCINEX) 600 MG 12 hr tablet Take 1 tablet (600 mg total) by mouth 2 (two) times daily.  Marland Kitchen guaiFENesin-dextromethorphan (ROBITUSSIN DM) 100-10 MG/5ML syrup Take 5 mLs by mouth every 4 (four) hours as needed for cough.  . Multiple Vitamins-Minerals (MULTIVITAMIN PO) Take by mouth daily.  Marland Kitchen oxybutynin (DITROPAN) 5 MG tablet Take 5 mg by mouth daily.   Marland Kitchen venlafaxine (EFFEXOR) 75 MG tablet Take 75 mg by mouth daily.   No current  facility-administered medications on file prior to visit.   Past Medical History  Diagnosis Date  . GERD (gastroesophageal reflux disease)     h/o PUD  . CAD (coronary artery disease)     cath 2000 30% single vessel, normal nuclear stress test 12/03/2010, no evidence ischemia  . CKD (chronic kidney disease) stage 3, GFR 30-59 ml/min     baseline Cr 1.7  . Depression   . Hx pulmonary embolism 08/1991    coumadin  . History of phlebitis     . Allergic rhinitis   . Urge incontinence   . Elevated PSA     previous-normalized (followed by Dr. Jeffie Pollock, rec no repeat unless urinary sxs)  . HLD (hyperlipidemia)     hypertriglyceridemia  . Asthma     remote  . Arthritis   . Blood transfusion 1990's  . Schatzki's ring 02/2012    s/p dilation [name]  . History of fracture of right hip   . Hearing loss 06/2012    eval - rec annual exam  . Essential tremor 09/27/2008  . Prediabetes   . DISH (diffuse idiopathic skeletal hyperostosis) 03/2013    lumbar spine on xray  . DDD (degenerative disc disease) 03/2013    by CT, diffuse multilevel cervical and lumbar spondylosis  . Osteoporosis 11/2013    DEXA T score -2.7  . Macrocytic anemia     stable B12/folate and periph smear 05/2013  . Sleep apnea     no CPAP- sleep apnea "cleared up 15 years ago"  . Dizziness     multifactorial, s/p PT at [facility] 06/2014 with HEP  . History of pyelonephritis 05/2014    hospitalization with sepsis  . Pulmonary emboli      Review of Systems Per HPI unless specifically indicated above     Objective:    BP 124/60 mmHg  Pulse 88  Temp(Src) 97.6 F (36.4 C) (Oral)  Wt 153 lb 12 oz (69.741 kg)  Wt Readings from Last 3 Encounters:  12/15/14 153 lb 12 oz (69.741 kg)  12/05/14 148 lb 12.8 oz (67.495 kg)  11/24/14 163 lb (73.936 kg)    Physical Exam  Constitutional: He appears well-developed and well-nourished. No distress.  Frail appearing elderly walks with walker  HENT:  Mouth/Throat: Oropharynx is clear and moist. No oropharyngeal exudate.  Neck: Normal range of motion. Neck supple.  No pain at C7 spinous process  Cardiovascular: Normal rate, regular rhythm, normal heart sounds and intact distal pulses.   No murmur heard. Pulmonary/Chest: Effort normal and breath sounds normal. No respiratory distress. He has no wheezes. He has no rales.  Abdominal: Soft. He exhibits no distension and no mass. There is no tenderness. There is no rebound and  no guarding.  Musculoskeletal: He exhibits no edema.  Neurological:  Tremor present  Skin: Skin is warm and dry. No rash noted.  Psychiatric: He has a normal mood and affect.  Nursing note and vitals reviewed.  Results for orders placed or performed in visit on 12/15/14  Comprehensive metabolic panel  Result Value Ref Range   Sodium 138 135 - 145 mEq/L   Potassium 4.6 3.5 - 5.3 mEq/L   Chloride 102 96 - 112 mEq/L   CO2 26 19 - 32 mEq/L   Glucose, Bld 96 70 - 99 mg/dL   BUN 20 6 - 23 mg/dL   Creat 1.12 0.50 - 1.35 mg/dL   Total Bilirubin 0.6 0.2 - 1.2 mg/dL   Alkaline Phosphatase 165 (H) 39 -  117 U/L   AST 51 (H) 0 - 37 U/L   ALT 49 0 - 53 U/L   Total Protein 6.3 6.0 - 8.3 g/dL   Albumin 3.1 (L) 3.5 - 5.2 g/dL   Calcium 9.4 8.4 - 42.3 mg/dL  CBC with Differential/Platelet  Result Value Ref Range   WBC 6.4 4.0 - 10.5 K/uL   RBC 3.34 (L) 4.22 - 5.81 MIL/uL   Hemoglobin 11.4 (L) 13.0 - 17.0 g/dL   HCT 70.2 (L) 30.1 - 72.0 %   MCV 104.2 (H) 78.0 - 100.0 fL   MCH 34.1 (H) 26.0 - 34.0 pg   MCHC 32.8 30.0 - 36.0 g/dL   RDW 91.0 68.1 - 66.1 %   Platelets 225 150 - 400 K/uL   MPV 9.2 8.6 - 12.4 fL   Neutrophils Relative % 59 43 - 77 %   Neutro Abs 3.8 1.7 - 7.7 K/uL   Lymphocytes Relative 30 12 - 46 %   Lymphs Abs 1.9 0.7 - 4.0 K/uL   Monocytes Relative 7 3 - 12 %   Monocytes Absolute 0.4 0.1 - 1.0 K/uL   Eosinophils Relative 3 0 - 5 %   Eosinophils Absolute 0.2 0.0 - 0.7 K/uL   Basophils Relative 1 0 - 1 %   Basophils Absolute 0.1 0.0 - 0.1 K/uL   Smear Review SEE NOTE   POCT Urinalysis Dipstick  Result Value Ref Range   Color, UA Yellow    Clarity, UA Clear    Glucose, UA Negative    Bilirubin, UA Negative    Ketones, UA Negative    Spec Grav, UA 1.015    Blood, UA Negative    pH, UA 7.0    Protein, UA Negative    Urobilinogen, UA 0.2    Nitrite, UA Negative    Leukocytes, UA small (1+)       Assessment & Plan:  A total of 45 minutes were spent face-to-face  with the patient during this encounter and over half of that time was spent on counseling and coordination of care   Problem List Items Addressed This Visit    UNSTEADY GAIT    Home health PT/OT involved. Now regularly using walker. continue this.      Syncope    Pending holter monitor for presumed syncope which led to fall although I wonder if gait instability actually led to fall. Memory trouble limited history.      Relevant Medications   aspirin EC 81 MG tablet   Personal history of pulmonary embolism    Reviewed pros/cons of continued coumadin. With increased instability and recent fall, risks likely outweight benefits after one unprovoked episode of PE.  Will stop coumadin and return in 2 wks to undergo hypercoagulable workup (will also review old records to see if one ever performed). This will also better guide treatment decision.  Pt/wife agree with plan. HASBLED 32yr risk of 2 = 4.1% (although pt doesn't have afib). Would check if not already done: ESR, prot C and S, antithrombin, and factor V leiden and prothrombin gene mutations)      Macrocytic anemia    Followed by heme years ago, released from their care. Workup unrevealing (B12, folate, periph smear).      Relevant Orders   CBC with Differential/Platelet (Completed)   Dysuria    Endorses several week history of dysuria, incomplete voiding and nocturia. Check UA today.      Relevant Orders   Urine culture  Dementia    Wife is general and health care POA.  ?alz vs vascular vs parkinsonisms.. Continue to monitor. No changes today, consider aricept at future visit.      Coronary atherosclerosis    Will recommend start aspirin daily as we're stopping coumadin. H/o nuclear stress test without ischemia..      Relevant Medications   aspirin EC 81 MG tablet   Closed C7 fracture - Primary    Recovered well. Now off cervical collar. Released by neurosurgery.       CKD (chronic kidney disease) stage 3, GFR 30-59  ml/min    Recheck Cr today - actually much improved. Continue to monitor.      Relevant Orders   Comprehensive metabolic panel (Completed)   POCT Urinalysis Dipstick (Completed)   Advanced care planning/counseling discussion    Given dementia concern, pt/wife have filled out general power of attorney form. Naming wife as general POA. Advanced directives otherwise in chart from 06/2012. HCPOA is wife [name]. Does not want prolonged support if terminally ill.          Follow up plan: Return in about 4 weeks (around 01/12/2015), or as needed, for follow up visit.

## 2014-12-16 ENCOUNTER — Encounter: Payer: Self-pay | Admitting: Family Medicine

## 2014-12-16 DIAGNOSIS — R300 Dysuria: Secondary | ICD-10-CM | POA: Insufficient documentation

## 2014-12-16 LAB — CBC WITH DIFFERENTIAL/PLATELET
BASOS PCT: 1 % (ref 0–1)
Basophils Absolute: 0.1 10*3/uL (ref 0.0–0.1)
EOS ABS: 0.2 10*3/uL (ref 0.0–0.7)
Eosinophils Relative: 3 % (ref 0–5)
HEMATOCRIT: 34.8 % — AB (ref 39.0–52.0)
Hemoglobin: 11.4 g/dL — ABNORMAL LOW (ref 13.0–17.0)
LYMPHS ABS: 1.9 10*3/uL (ref 0.7–4.0)
Lymphocytes Relative: 30 % (ref 12–46)
MCH: 34.1 pg — ABNORMAL HIGH (ref 26.0–34.0)
MCHC: 32.8 g/dL (ref 30.0–36.0)
MCV: 104.2 fL — AB (ref 78.0–100.0)
MONO ABS: 0.4 10*3/uL (ref 0.1–1.0)
MONOS PCT: 7 % (ref 3–12)
MPV: 9.2 fL (ref 8.6–12.4)
Neutro Abs: 3.8 10*3/uL (ref 1.7–7.7)
Neutrophils Relative %: 59 % (ref 43–77)
Platelets: 225 10*3/uL (ref 150–400)
RBC: 3.34 MIL/uL — ABNORMAL LOW (ref 4.22–5.81)
RDW: 14.8 % (ref 11.5–15.5)
WBC: 6.4 10*3/uL (ref 4.0–10.5)

## 2014-12-16 LAB — COMPREHENSIVE METABOLIC PANEL
ALBUMIN: 3.1 g/dL — AB (ref 3.5–5.2)
ALT: 49 U/L (ref 0–53)
AST: 51 U/L — AB (ref 0–37)
Alkaline Phosphatase: 165 U/L — ABNORMAL HIGH (ref 39–117)
BUN: 20 mg/dL (ref 6–23)
CALCIUM: 9.4 mg/dL (ref 8.4–10.5)
CHLORIDE: 102 meq/L (ref 96–112)
CO2: 26 mEq/L (ref 19–32)
Creat: 1.12 mg/dL (ref 0.50–1.35)
Glucose, Bld: 96 mg/dL (ref 70–99)
Potassium: 4.6 mEq/L (ref 3.5–5.3)
SODIUM: 138 meq/L (ref 135–145)
Total Bilirubin: 0.6 mg/dL (ref 0.2–1.2)
Total Protein: 6.3 g/dL (ref 6.0–8.3)

## 2014-12-16 NOTE — Addendum Note (Signed)
Addended by: Ria Bush on: 12/16/2014 11:26 AM   Modules accepted: Level of Service

## 2014-12-16 NOTE — Assessment & Plan Note (Signed)
Recovered well. Now off cervical collar. Released by neurosurgery.

## 2014-12-16 NOTE — Assessment & Plan Note (Addendum)
Reviewed pros/cons of continued coumadin. With increased instability and recent fall, risks likely outweight benefits after one unprovoked episode of PE.  Will stop coumadin and return in 2 wks to undergo hypercoagulable workup (will also review old records to see if one ever performed). This will also better guide treatment decision.  Pt/wife agree with plan. HASBLED 25yrrisk of 2 = 4.1% (although pt doesn't have afib). Would check if not already done: ESR, prot C and S, antithrombin, and factor V leiden and prothrombin gene mutations)

## 2014-12-16 NOTE — Assessment & Plan Note (Signed)
Wife is general and health care POA.  ?alz vs vascular vs parkinsonisms.. Continue to monitor. No changes today, consider aricept at future visit.

## 2014-12-16 NOTE — Assessment & Plan Note (Signed)
Followed by heme years ago, released from their care. Workup unrevealing (B12, folate, periph smear).

## 2014-12-16 NOTE — Assessment & Plan Note (Addendum)
Pending holter monitor for presumed syncope which led to fall although I wonder if gait instability actually led to fall. Memory trouble limited history.

## 2014-12-16 NOTE — Assessment & Plan Note (Signed)
Will recommend start aspirin daily as we're stopping coumadin. H/o nuclear stress test without ischemia.Marland Kitchen

## 2014-12-16 NOTE — Assessment & Plan Note (Signed)
Recheck Cr today - actually much improved. Continue to monitor.

## 2014-12-16 NOTE — Assessment & Plan Note (Signed)
Endorses several week history of dysuria, incomplete voiding and nocturia. Check UA today.

## 2014-12-16 NOTE — Assessment & Plan Note (Signed)
Given dementia concern, pt/wife have filled out general power of attorney form. Naming wife as general POA. Advanced directives otherwise in chart from 06/2012. HCPOA is wife [name]. Does not want prolonged support if terminally ill.

## 2014-12-16 NOTE — Assessment & Plan Note (Signed)
Home health PT/OT involved. Now regularly using walker. continue this.

## 2014-12-18 ENCOUNTER — Other Ambulatory Visit: Payer: Self-pay | Admitting: Family Medicine

## 2014-12-18 DIAGNOSIS — R269 Unspecified abnormalities of gait and mobility: Secondary | ICD-10-CM | POA: Diagnosis not present

## 2014-12-18 DIAGNOSIS — I2699 Other pulmonary embolism without acute cor pulmonale: Secondary | ICD-10-CM | POA: Diagnosis not present

## 2014-12-18 DIAGNOSIS — S12600D Unspecified displaced fracture of seventh cervical vertebra, subsequent encounter for fracture with routine healing: Secondary | ICD-10-CM | POA: Diagnosis not present

## 2014-12-18 DIAGNOSIS — R55 Syncope and collapse: Secondary | ICD-10-CM

## 2014-12-18 DIAGNOSIS — G629 Polyneuropathy, unspecified: Secondary | ICD-10-CM | POA: Diagnosis not present

## 2014-12-18 DIAGNOSIS — E119 Type 2 diabetes mellitus without complications: Secondary | ICD-10-CM | POA: Diagnosis not present

## 2014-12-18 DIAGNOSIS — N183 Chronic kidney disease, stage 3 (moderate): Secondary | ICD-10-CM | POA: Diagnosis not present

## 2014-12-18 LAB — URINE CULTURE: Colony Count: >=100000

## 2014-12-18 MED ORDER — CIPROFLOXACIN HCL 250 MG PO TABS: ORAL_TABLET | ORAL | Status: DC

## 2014-12-20 DIAGNOSIS — G629 Polyneuropathy, unspecified: Secondary | ICD-10-CM | POA: Diagnosis not present

## 2014-12-20 DIAGNOSIS — E119 Type 2 diabetes mellitus without complications: Secondary | ICD-10-CM | POA: Diagnosis not present

## 2014-12-20 DIAGNOSIS — S12600D Unspecified displaced fracture of seventh cervical vertebra, subsequent encounter for fracture with routine healing: Secondary | ICD-10-CM | POA: Diagnosis not present

## 2014-12-20 DIAGNOSIS — N183 Chronic kidney disease, stage 3 (moderate): Secondary | ICD-10-CM | POA: Diagnosis not present

## 2014-12-20 DIAGNOSIS — I2699 Other pulmonary embolism without acute cor pulmonale: Secondary | ICD-10-CM | POA: Diagnosis not present

## 2014-12-20 DIAGNOSIS — R269 Unspecified abnormalities of gait and mobility: Secondary | ICD-10-CM | POA: Diagnosis not present

## 2014-12-21 ENCOUNTER — Other Ambulatory Visit: Payer: Self-pay

## 2014-12-21 DIAGNOSIS — E119 Type 2 diabetes mellitus without complications: Secondary | ICD-10-CM | POA: Diagnosis not present

## 2014-12-21 DIAGNOSIS — R269 Unspecified abnormalities of gait and mobility: Secondary | ICD-10-CM | POA: Diagnosis not present

## 2014-12-21 DIAGNOSIS — N183 Chronic kidney disease, stage 3 (moderate): Secondary | ICD-10-CM | POA: Diagnosis not present

## 2014-12-21 DIAGNOSIS — S12600D Unspecified displaced fracture of seventh cervical vertebra, subsequent encounter for fracture with routine healing: Secondary | ICD-10-CM | POA: Diagnosis not present

## 2014-12-21 DIAGNOSIS — I2699 Other pulmonary embolism without acute cor pulmonale: Secondary | ICD-10-CM | POA: Diagnosis not present

## 2014-12-21 DIAGNOSIS — G629 Polyneuropathy, unspecified: Secondary | ICD-10-CM | POA: Diagnosis not present

## 2014-12-21 MED ORDER — CIPROFLOXACIN HCL 250 MG PO TABS: ORAL_TABLET | ORAL | Status: DC

## 2014-12-21 NOTE — Telephone Encounter (Signed)
[name] called and request cipro called to [facility] for delivery; [name] said [facility] not [facility]. Advised med called to [facility] as instructed and requested delivery to pts home.

## 2014-12-22 ENCOUNTER — Telehealth: Payer: Self-pay

## 2014-12-22 DIAGNOSIS — I2699 Other pulmonary embolism without acute cor pulmonale: Secondary | ICD-10-CM | POA: Diagnosis not present

## 2014-12-22 DIAGNOSIS — E119 Type 2 diabetes mellitus without complications: Secondary | ICD-10-CM | POA: Diagnosis not present

## 2014-12-22 DIAGNOSIS — N183 Chronic kidney disease, stage 3 (moderate): Secondary | ICD-10-CM | POA: Diagnosis not present

## 2014-12-22 DIAGNOSIS — R269 Unspecified abnormalities of gait and mobility: Secondary | ICD-10-CM | POA: Diagnosis not present

## 2014-12-22 DIAGNOSIS — G629 Polyneuropathy, unspecified: Secondary | ICD-10-CM | POA: Diagnosis not present

## 2014-12-22 DIAGNOSIS — S12600D Unspecified displaced fracture of seventh cervical vertebra, subsequent encounter for fracture with routine healing: Secondary | ICD-10-CM | POA: Diagnosis not present

## 2014-12-22 NOTE — Telephone Encounter (Signed)
[name] Bunting, RN at 12/22/2014 8:55 AM     Status: Signed       Expand All Collapse All   Received call from [facility] w/ critical holter reading:  "Dropped beats due to sinus arrhythmia, non-conducted PACs, escape beat and 2:1 conductin w/ wenckenbach periodicity. No symptoms reported on the diary."  Report faxed to our office.  Christell Faith, PA notified, as Dr. Fletcher Anon is out of the office and Dr. Rockey Situ is rounding at the hospital.

## 2014-12-22 NOTE — Telephone Encounter (Signed)
Received call from [facility] w/ critical holter reading:  "Dropped beats due to sinus arrhythmia, non-conducted PACs, escape beat and 2:1 conductin w/ wenckenbach periodicity. No symptoms reported on the diary."  Report faxed to our office.  Christell Faith, PA notified, as Dr. Fletcher Anon is out of the office and Dr. Rockey Situ is rounding at the hospital.

## 2014-12-22 NOTE — Telephone Encounter (Signed)
Per Christell Faith, PA:  "It actually does not appear to be critical.  Sometimes 2:1 can be worrisome, but it it's wenkenbach and asymptomatic it's no big deal.   The lowest HR was in the 70s.   Did someone from the company or [place] call them to see if any symptoms?"  [facility] reported pt is asymptomatic.

## 2014-12-24 ENCOUNTER — Other Ambulatory Visit: Payer: Self-pay | Admitting: Family Medicine

## 2014-12-25 DIAGNOSIS — E119 Type 2 diabetes mellitus without complications: Secondary | ICD-10-CM | POA: Diagnosis not present

## 2014-12-25 DIAGNOSIS — G629 Polyneuropathy, unspecified: Secondary | ICD-10-CM | POA: Diagnosis not present

## 2014-12-25 DIAGNOSIS — S12600D Unspecified displaced fracture of seventh cervical vertebra, subsequent encounter for fracture with routine healing: Secondary | ICD-10-CM | POA: Diagnosis not present

## 2014-12-25 DIAGNOSIS — N183 Chronic kidney disease, stage 3 (moderate): Secondary | ICD-10-CM | POA: Diagnosis not present

## 2014-12-25 DIAGNOSIS — R269 Unspecified abnormalities of gait and mobility: Secondary | ICD-10-CM | POA: Diagnosis not present

## 2014-12-25 DIAGNOSIS — I2699 Other pulmonary embolism without acute cor pulmonale: Secondary | ICD-10-CM | POA: Diagnosis not present

## 2014-12-27 DIAGNOSIS — N183 Chronic kidney disease, stage 3 (moderate): Secondary | ICD-10-CM | POA: Diagnosis not present

## 2014-12-27 DIAGNOSIS — S12600D Unspecified displaced fracture of seventh cervical vertebra, subsequent encounter for fracture with routine healing: Secondary | ICD-10-CM | POA: Diagnosis not present

## 2014-12-27 DIAGNOSIS — R269 Unspecified abnormalities of gait and mobility: Secondary | ICD-10-CM | POA: Diagnosis not present

## 2014-12-27 DIAGNOSIS — G629 Polyneuropathy, unspecified: Secondary | ICD-10-CM | POA: Diagnosis not present

## 2014-12-27 DIAGNOSIS — E119 Type 2 diabetes mellitus without complications: Secondary | ICD-10-CM | POA: Diagnosis not present

## 2014-12-27 DIAGNOSIS — I2699 Other pulmonary embolism without acute cor pulmonale: Secondary | ICD-10-CM | POA: Diagnosis not present

## 2014-12-28 ENCOUNTER — Other Ambulatory Visit (INDEPENDENT_AMBULATORY_CARE_PROVIDER_SITE_OTHER): Payer: Medicare Other

## 2014-12-28 DIAGNOSIS — Z86711 Personal history of pulmonary embolism: Secondary | ICD-10-CM | POA: Diagnosis not present

## 2014-12-29 LAB — SEDIMENTATION RATE: Sed Rate: 20 mm/hr (ref 0–20)

## 2015-01-01 LAB — HYPERCOAGULABLE PANEL, COMPREHENSIVE
AntiThromb III Func: 77 % (ref 76–126)
Anticardiolipin IgA: 2 APL U/mL (ref <22)
Anticardiolipin IgG: 7 GPL U/mL (ref <23)
Anticardiolipin IgM: 2 MPL U/mL (ref <11)
BETA 2 GLYCO I IGG: 7 G Units (ref <20)
Beta-2-Glycoprotein I IgA: 6 A Units (ref <20)
Beta-2-Glycoprotein I IgM: 6 M Units (ref <20)
DRVVT: 35.8 s (ref <42.9)
Lupus Anticoagulant: NOT DETECTED
PTT LA: 30.4 s (ref 28.0–43.0)
Protein C Activity: 128 % (ref 75–133)
Protein C, Total: 80 % (ref 72–160)
Protein S Activity: 88 % (ref 69–129)
Protein S Total: 79 % (ref 60–150)

## 2015-01-02 DIAGNOSIS — S12600D Unspecified displaced fracture of seventh cervical vertebra, subsequent encounter for fracture with routine healing: Secondary | ICD-10-CM | POA: Diagnosis not present

## 2015-01-02 DIAGNOSIS — N183 Chronic kidney disease, stage 3 (moderate): Secondary | ICD-10-CM | POA: Diagnosis not present

## 2015-01-02 DIAGNOSIS — G629 Polyneuropathy, unspecified: Secondary | ICD-10-CM | POA: Diagnosis not present

## 2015-01-02 DIAGNOSIS — I2699 Other pulmonary embolism without acute cor pulmonale: Secondary | ICD-10-CM | POA: Diagnosis not present

## 2015-01-02 DIAGNOSIS — R269 Unspecified abnormalities of gait and mobility: Secondary | ICD-10-CM | POA: Diagnosis not present

## 2015-01-02 DIAGNOSIS — E119 Type 2 diabetes mellitus without complications: Secondary | ICD-10-CM | POA: Diagnosis not present

## 2015-01-03 ENCOUNTER — Telehealth: Payer: Self-pay

## 2015-01-03 DIAGNOSIS — R269 Unspecified abnormalities of gait and mobility: Secondary | ICD-10-CM | POA: Diagnosis not present

## 2015-01-03 DIAGNOSIS — I2699 Other pulmonary embolism without acute cor pulmonale: Secondary | ICD-10-CM | POA: Diagnosis not present

## 2015-01-03 DIAGNOSIS — S12600D Unspecified displaced fracture of seventh cervical vertebra, subsequent encounter for fracture with routine healing: Secondary | ICD-10-CM | POA: Diagnosis not present

## 2015-01-03 DIAGNOSIS — G629 Polyneuropathy, unspecified: Secondary | ICD-10-CM | POA: Diagnosis not present

## 2015-01-03 DIAGNOSIS — N183 Chronic kidney disease, stage 3 (moderate): Secondary | ICD-10-CM | POA: Diagnosis not present

## 2015-01-03 DIAGNOSIS — E119 Type 2 diabetes mellitus without complications: Secondary | ICD-10-CM | POA: Diagnosis not present

## 2015-01-03 NOTE — Telephone Encounter (Signed)
Received results of pt's holter monitor:  "NSR w/ intermittent 1st degree AVB and some dropped PACs. -Nighttime pauses < 2 seconds. -No significant arrhythmia to explain syncope."  Left message for pt to call back.

## 2015-01-03 NOTE — Telephone Encounter (Signed)
Pt returning our call, but it was 5pm so stated we would call her back.

## 2015-01-03 NOTE — Telephone Encounter (Signed)
Reviewed results w/ pt and his wife on speaker phone.  They verbalize understanding and will call back w/ any questions or concerns.

## 2015-01-04 DIAGNOSIS — R269 Unspecified abnormalities of gait and mobility: Secondary | ICD-10-CM | POA: Diagnosis not present

## 2015-01-04 DIAGNOSIS — S12600D Unspecified displaced fracture of seventh cervical vertebra, subsequent encounter for fracture with routine healing: Secondary | ICD-10-CM | POA: Diagnosis not present

## 2015-01-04 DIAGNOSIS — I2699 Other pulmonary embolism without acute cor pulmonale: Secondary | ICD-10-CM | POA: Diagnosis not present

## 2015-01-04 DIAGNOSIS — G629 Polyneuropathy, unspecified: Secondary | ICD-10-CM | POA: Diagnosis not present

## 2015-01-04 DIAGNOSIS — E119 Type 2 diabetes mellitus without complications: Secondary | ICD-10-CM | POA: Diagnosis not present

## 2015-01-04 DIAGNOSIS — N183 Chronic kidney disease, stage 3 (moderate): Secondary | ICD-10-CM | POA: Diagnosis not present

## 2015-01-06 ENCOUNTER — Encounter: Payer: Self-pay | Admitting: Family Medicine

## 2015-01-08 ENCOUNTER — Telehealth: Payer: Self-pay | Admitting: Family Medicine

## 2015-01-08 NOTE — Telephone Encounter (Signed)
Agree. thanks

## 2015-01-08 NOTE — Telephone Encounter (Signed)
FYI-Message left advising [name] that make up PT visit was okay.

## 2015-01-08 NOTE — Telephone Encounter (Signed)
[name] called wanting to get a verbal order to make up a PT visit last week.  [name] missed one day last week.

## 2015-01-09 DIAGNOSIS — N183 Chronic kidney disease, stage 3 (moderate): Secondary | ICD-10-CM | POA: Diagnosis not present

## 2015-01-09 DIAGNOSIS — G629 Polyneuropathy, unspecified: Secondary | ICD-10-CM | POA: Diagnosis not present

## 2015-01-09 DIAGNOSIS — S12600D Unspecified displaced fracture of seventh cervical vertebra, subsequent encounter for fracture with routine healing: Secondary | ICD-10-CM | POA: Diagnosis not present

## 2015-01-09 DIAGNOSIS — E119 Type 2 diabetes mellitus without complications: Secondary | ICD-10-CM | POA: Diagnosis not present

## 2015-01-09 DIAGNOSIS — I2699 Other pulmonary embolism without acute cor pulmonale: Secondary | ICD-10-CM | POA: Diagnosis not present

## 2015-01-09 DIAGNOSIS — R269 Unspecified abnormalities of gait and mobility: Secondary | ICD-10-CM | POA: Diagnosis not present

## 2015-01-11 ENCOUNTER — Encounter (INDEPENDENT_AMBULATORY_CARE_PROVIDER_SITE_OTHER): Payer: Medicare Other

## 2015-01-11 ENCOUNTER — Other Ambulatory Visit: Payer: Self-pay

## 2015-01-11 ENCOUNTER — Telehealth: Payer: Self-pay | Admitting: *Deleted

## 2015-01-11 DIAGNOSIS — R269 Unspecified abnormalities of gait and mobility: Secondary | ICD-10-CM | POA: Diagnosis not present

## 2015-01-11 DIAGNOSIS — L578 Other skin changes due to chronic exposure to nonionizing radiation: Secondary | ICD-10-CM | POA: Diagnosis not present

## 2015-01-11 DIAGNOSIS — S12600D Unspecified displaced fracture of seventh cervical vertebra, subsequent encounter for fracture with routine healing: Secondary | ICD-10-CM | POA: Diagnosis not present

## 2015-01-11 DIAGNOSIS — I2699 Other pulmonary embolism without acute cor pulmonale: Secondary | ICD-10-CM | POA: Diagnosis not present

## 2015-01-11 DIAGNOSIS — G629 Polyneuropathy, unspecified: Secondary | ICD-10-CM | POA: Diagnosis not present

## 2015-01-11 DIAGNOSIS — N183 Chronic kidney disease, stage 3 (moderate): Secondary | ICD-10-CM | POA: Diagnosis not present

## 2015-01-11 DIAGNOSIS — R55 Syncope and collapse: Secondary | ICD-10-CM

## 2015-01-11 DIAGNOSIS — E119 Type 2 diabetes mellitus without complications: Secondary | ICD-10-CM | POA: Diagnosis not present

## 2015-01-11 DIAGNOSIS — L638 Other alopecia areata: Secondary | ICD-10-CM | POA: Diagnosis not present

## 2015-01-11 DIAGNOSIS — L570 Actinic keratosis: Secondary | ICD-10-CM | POA: Diagnosis not present

## 2015-01-11 NOTE — Telephone Encounter (Signed)
Agree. thanks

## 2015-01-11 NOTE — Telephone Encounter (Signed)
Stacy (PT) with HH needs verbal order for extension on home PT for strengthening to help him walk more steadily. She said he is nowhere close to goal. I went ahead and gave verbal order.

## 2015-01-15 ENCOUNTER — Ambulatory Visit (INDEPENDENT_AMBULATORY_CARE_PROVIDER_SITE_OTHER): Payer: Medicare Other | Admitting: Family Medicine

## 2015-01-15 ENCOUNTER — Encounter: Payer: Self-pay | Admitting: Family Medicine

## 2015-01-15 VITALS — BP 118/60 | HR 100 | Temp 98.1°F | Wt 166.5 lb

## 2015-01-15 DIAGNOSIS — F0390 Unspecified dementia without behavioral disturbance: Secondary | ICD-10-CM

## 2015-01-15 DIAGNOSIS — R55 Syncope and collapse: Secondary | ICD-10-CM | POA: Diagnosis not present

## 2015-01-15 DIAGNOSIS — Z86711 Personal history of pulmonary embolism: Secondary | ICD-10-CM

## 2015-01-15 DIAGNOSIS — F32A Depression, unspecified: Secondary | ICD-10-CM

## 2015-01-15 DIAGNOSIS — K5900 Constipation, unspecified: Secondary | ICD-10-CM

## 2015-01-15 DIAGNOSIS — E119 Type 2 diabetes mellitus without complications: Secondary | ICD-10-CM | POA: Diagnosis not present

## 2015-01-15 DIAGNOSIS — I2510 Atherosclerotic heart disease of native coronary artery without angina pectoris: Secondary | ICD-10-CM

## 2015-01-15 DIAGNOSIS — E785 Hyperlipidemia, unspecified: Secondary | ICD-10-CM

## 2015-01-15 DIAGNOSIS — S12690S Other displaced fracture of seventh cervical vertebra, sequela: Secondary | ICD-10-CM

## 2015-01-15 DIAGNOSIS — S12600D Unspecified displaced fracture of seventh cervical vertebra, subsequent encounter for fracture with routine healing: Secondary | ICD-10-CM | POA: Diagnosis not present

## 2015-01-15 DIAGNOSIS — F329 Major depressive disorder, single episode, unspecified: Secondary | ICD-10-CM

## 2015-01-15 DIAGNOSIS — N3941 Urge incontinence: Secondary | ICD-10-CM

## 2015-01-15 DIAGNOSIS — R269 Unspecified abnormalities of gait and mobility: Secondary | ICD-10-CM | POA: Diagnosis not present

## 2015-01-15 DIAGNOSIS — N183 Chronic kidney disease, stage 3 (moderate): Secondary | ICD-10-CM | POA: Diagnosis not present

## 2015-01-15 DIAGNOSIS — I2699 Other pulmonary embolism without acute cor pulmonale: Secondary | ICD-10-CM | POA: Diagnosis not present

## 2015-01-15 DIAGNOSIS — G629 Polyneuropathy, unspecified: Secondary | ICD-10-CM | POA: Diagnosis not present

## 2015-01-15 MED ORDER — DONEPEZIL HCL 5 MG PO TABS: 5.0000 mg | ORAL_TABLET | Freq: Every day | ORAL | Status: DC

## 2015-01-15 NOTE — Assessment & Plan Note (Signed)
Reviewed latest FLP with patient with LDL 77, offered decreased lipitor dose - pt decides to stay on current regimen.

## 2015-01-15 NOTE — Assessment & Plan Note (Signed)
Now regularly using walker. Appreciate PT assistance with patient.

## 2015-01-15 NOTE — Progress Notes (Signed)
BP 118/60 mmHg  Pulse 100  Temp(Src) 98.1 F (36.7 C) (Oral)  Wt 166 lb 8 oz (75.524 kg)   CC: 1 mo f/u visit  Subjective:    Patient ID: [name], male    DOB: 06-Apr-1931, 81 y.o.   MRN: [id number]  HPI: DAMARIA STOFKO is a 81 y.o. male presenting on 01/15/2015 for Follow-up   Reviewed recent normal hypercoagulable panel - we have stopped anticoagulation (initially on for 1 unprovoked PE and fmhx hypercoagulability).   Recent UCx inconclusive but given sxs we treated with 5d cipro course. Persistent incontinence and mild dysuria, uses depens. Denies rash, urethral discharge or abd pain. Wife wonders about yeast infection.   Dementia - recent dx with MMSE 19-23/30. ?alz vs vascular vs parkinsonism. Decreased reading noted. Decreased interaction with other ppl noted. Increased sleeping noted.   Syncope - holter monitor normal.   Working with physical therapy - walking better with walker and with walkers. Planned to finish at end of month.   Mild constipation despite colace and metamucil regularly.   Relevant past medical, surgical, family and social history reviewed and updated as indicated. Interim medical history since our last visit reviewed. Allergies and medications reviewed and updated. Current Outpatient Prescriptions on File Prior to Visit  Medication Sig  . albuterol (PROVENTIL HFA;VENTOLIN HFA) 108 (90 BASE) MCG/ACT inhaler Inhale 2 puffs into the lungs every 6 (six) hours as needed for wheezing or shortness of breath.  Marland Kitchen aspirin EC 81 MG tablet Take 81 mg by mouth daily.  Marland Kitchen atorvastatin (LIPITOR) 40 MG tablet Take 40 mg by mouth at bedtime.  . Cholecalciferol (VITAMIN D) 2000 UNITS CAPS Take 1 capsule by mouth daily.  . Coenzyme Q10 (COQ-10) 100 MG CAPS Take 1 capsule by mouth at bedtime.   . Cyanocobalamin (B-12 PO) Take 10,000 mcg by mouth every other day.   Marland Kitchen dexlansoprazole (DEXILANT) 60 MG capsule Take 60 mg by mouth daily.  Marland Kitchen docusate sodium (COLACE)  100 MG capsule Take 100 mg by mouth daily.  . fenofibrate (TRICOR) 145 MG tablet TAKE 1 TABLET DAILY  . fexofenadine (ALLEGRA) 180 MG tablet Take 180 mg by mouth every evening.   . gabapentin (NEURONTIN) 100 MG capsule Take 100 mg by mouth as directed. 200mg  in am and 100mg  in pm  . guaiFENesin (MUCINEX) 600 MG 12 hr tablet Take 1 tablet (600 mg total) by mouth 2 (two) times daily. (Patient taking differently: Take 600 mg by mouth 2 (two) times daily as needed. )  . Multiple Vitamins-Minerals (MULTIVITAMIN PO) Take by mouth daily.  Marland Kitchen oxybutynin (DITROPAN) 5 MG tablet Take 5 mg by mouth 2 (two) times daily.   . psyllium (METAMUCIL SMOOTH TEXTURE) 28 % packet Take 1 packet by mouth daily.  Marland Kitchen venlafaxine (EFFEXOR) 75 MG tablet Take 75 mg by mouth daily.   No current facility-administered medications on file prior to visit.    Review of Systems Per HPI unless specifically indicated above     Objective:    BP 118/60 mmHg  Pulse 100  Temp(Src) 98.1 F (36.7 C) (Oral)  Wt 166 lb 8 oz (75.524 kg)  Wt Readings from Last 3 Encounters:  01/15/15 166 lb 8 oz (75.524 kg)  12/15/14 153 lb 12 oz (69.741 kg)  12/05/14 148 lb 12.8 oz (67.495 kg)   Body mass index is 25.32 kg/(m^2).  Physical Exam  Constitutional: He appears well-developed and well-nourished. No distress.  HENT:  Mouth/Throat: Oropharynx is clear and  moist. No oropharyngeal exudate.  Cardiovascular: Normal rate, regular rhythm, normal heart sounds and intact distal pulses.   No murmur heard. Pulmonary/Chest: Effort normal and breath sounds normal. No respiratory distress. He has no wheezes. He has no rales.  Musculoskeletal: He exhibits no edema.  Skin: Skin is warm and dry. No rash noted.  Psychiatric: He has a normal mood and affect.  Nursing note and vitals reviewed.  Results for orders placed or performed in visit on 12/28/14  Sedimentation rate  Result Value Ref Range   Sed Rate 20 0 - 20 mm/hr  Hypercoagulable panel,  comprehensive  Result Value Ref Range   AntiThromb III Func 77 76 - 126 %   Protein C, Total 80 72 - 160 %   Protein S Total 79 60 - 150 %   PTT Lupus Anticoagulant 30.4 28.0 - 43.0 secs   PTTLA 4:1 Mix NOT APPL 28.0 - 43.0 secs   PTTLA Confirmation NOT APPL <8.0 secs   DRVVT 35.8 <42.9 secs   DRVVT 1:1 Mix NOT APPL <42.9 secs   Drvvt confirmation NOT APPL <1.15 Ratio   Lupus Anticoagulant NOT DETECTED NOT DETECTED   Beta-2 Glyco I IgG 7 <20 G Units   Beta-2-Glycoprotein I IgM 6 <20 M Units   Beta-2-Glycoprotein I IgA 6 <20 A Units   Anticardiolipin IgG 7 <23 GPL U/mL   Anticardiolipin IgM 2 <11 MPL U/mL   Anticardiolipin IgA 2 <22 APL U/mL   Result REPORT    Interpretation REPORT    Reviewer REPORT    Protein C Activity 128 75 - 133 %   Protein S Activity 88 69 - 129 %   Result REPORT    Interpretation REPORT    Reviewer REPORT       Assessment & Plan:   Problem List Items Addressed This Visit    UNSTEADY GAIT    Now regularly using walker. Appreciate PT assistance with patient.      Syncope    S/p unrevealing evaluation at hospitalization including recent normal holter monitor ?actually just fall and not true syncope      Personal history of pulmonary embolism    Off coumadin, doing well. On aspirin 81mg  daily.      INCONTINENCE, URGE    Continue low dose oxybutynin. Recently completed treatment for UTI with 5d cipro course. rec clotrimazole to penis for possible yeast contribution to dysuria (several recent abx courses).       HLD (hyperlipidemia)    Reviewed latest FLP with patient with LDL 77, offered decreased lipitor dose - pt decides to stay on current regimen.      Depression    Continue low dose venlafaxine. Wife thinks this is very helpful.      Dementia - Primary    Unclear type - start aricept 5mg  daily. Advised monitor for nausea/abd pain.      Relevant Medications   donepezil (ARICEPT) 5 MG tablet   Coronary atherosclerosis    Continue  statin, fibrate, aspirin       CN (constipation)    Continue colace and metamucil, push water.      RESOLVED: Closed C7 fracture    Seems fully healed.          Follow up plan: Return in about 2 months (around 03/17/2015), or as needed, for follow up visit.

## 2015-01-15 NOTE — Assessment & Plan Note (Signed)
Off coumadin, doing well. On aspirin 81mg  daily.

## 2015-01-15 NOTE — Assessment & Plan Note (Signed)
Continue statin, fibrate, aspirin

## 2015-01-15 NOTE — Assessment & Plan Note (Signed)
Continue low dose venlafaxine. Wife thinks this is very helpful.

## 2015-01-15 NOTE — Assessment & Plan Note (Signed)
Continue low dose oxybutynin. Recently completed treatment for UTI with 5d cipro course. rec clotrimazole to penis for possible yeast contribution to dysuria (several recent abx courses).

## 2015-01-15 NOTE — Assessment & Plan Note (Signed)
Seems fully healed.

## 2015-01-15 NOTE — Patient Instructions (Addendum)
You are doing well today.  Try clotrimazole cream for burning.  For memory - let's start aricept 5mg  daily and call me with an update about 2-3 weeks after starting.

## 2015-01-15 NOTE — Assessment & Plan Note (Signed)
Continue colace and metamucil, push water.

## 2015-01-15 NOTE — Assessment & Plan Note (Signed)
Unclear type - start aricept 5mg  daily. Advised monitor for nausea/abd pain.

## 2015-01-15 NOTE — Assessment & Plan Note (Signed)
S/p unrevealing evaluation at hospitalization including recent normal holter monitor ?actually just fall and not true syncope

## 2015-01-15 NOTE — Progress Notes (Signed)
Pre visit review using our clinic review tool, if applicable. No additional management support is needed unless otherwise documented below in the visit note. 

## 2015-01-16 DIAGNOSIS — R269 Unspecified abnormalities of gait and mobility: Secondary | ICD-10-CM | POA: Diagnosis not present

## 2015-01-16 DIAGNOSIS — I2699 Other pulmonary embolism without acute cor pulmonale: Secondary | ICD-10-CM | POA: Diagnosis not present

## 2015-01-16 DIAGNOSIS — G629 Polyneuropathy, unspecified: Secondary | ICD-10-CM | POA: Diagnosis not present

## 2015-01-16 DIAGNOSIS — N183 Chronic kidney disease, stage 3 (moderate): Secondary | ICD-10-CM | POA: Diagnosis not present

## 2015-01-16 DIAGNOSIS — S12600D Unspecified displaced fracture of seventh cervical vertebra, subsequent encounter for fracture with routine healing: Secondary | ICD-10-CM | POA: Diagnosis not present

## 2015-01-16 DIAGNOSIS — E119 Type 2 diabetes mellitus without complications: Secondary | ICD-10-CM | POA: Diagnosis not present

## 2015-01-17 DIAGNOSIS — S12600D Unspecified displaced fracture of seventh cervical vertebra, subsequent encounter for fracture with routine healing: Secondary | ICD-10-CM | POA: Diagnosis not present

## 2015-01-17 DIAGNOSIS — N183 Chronic kidney disease, stage 3 (moderate): Secondary | ICD-10-CM | POA: Diagnosis not present

## 2015-01-17 DIAGNOSIS — G629 Polyneuropathy, unspecified: Secondary | ICD-10-CM | POA: Diagnosis not present

## 2015-01-17 DIAGNOSIS — I2699 Other pulmonary embolism without acute cor pulmonale: Secondary | ICD-10-CM | POA: Diagnosis not present

## 2015-01-17 DIAGNOSIS — E119 Type 2 diabetes mellitus without complications: Secondary | ICD-10-CM | POA: Diagnosis not present

## 2015-01-17 DIAGNOSIS — R269 Unspecified abnormalities of gait and mobility: Secondary | ICD-10-CM | POA: Diagnosis not present

## 2015-01-23 DIAGNOSIS — N183 Chronic kidney disease, stage 3 (moderate): Secondary | ICD-10-CM | POA: Diagnosis not present

## 2015-01-23 DIAGNOSIS — R269 Unspecified abnormalities of gait and mobility: Secondary | ICD-10-CM | POA: Diagnosis not present

## 2015-01-23 DIAGNOSIS — I2699 Other pulmonary embolism without acute cor pulmonale: Secondary | ICD-10-CM | POA: Diagnosis not present

## 2015-01-23 DIAGNOSIS — S12600D Unspecified displaced fracture of seventh cervical vertebra, subsequent encounter for fracture with routine healing: Secondary | ICD-10-CM | POA: Diagnosis not present

## 2015-01-23 DIAGNOSIS — G629 Polyneuropathy, unspecified: Secondary | ICD-10-CM | POA: Diagnosis not present

## 2015-01-23 DIAGNOSIS — E119 Type 2 diabetes mellitus without complications: Secondary | ICD-10-CM | POA: Diagnosis not present

## 2015-01-25 DIAGNOSIS — R269 Unspecified abnormalities of gait and mobility: Secondary | ICD-10-CM | POA: Diagnosis not present

## 2015-01-25 DIAGNOSIS — N183 Chronic kidney disease, stage 3 (moderate): Secondary | ICD-10-CM | POA: Diagnosis not present

## 2015-01-25 DIAGNOSIS — S12600D Unspecified displaced fracture of seventh cervical vertebra, subsequent encounter for fracture with routine healing: Secondary | ICD-10-CM | POA: Diagnosis not present

## 2015-01-25 DIAGNOSIS — I2699 Other pulmonary embolism without acute cor pulmonale: Secondary | ICD-10-CM | POA: Diagnosis not present

## 2015-01-25 DIAGNOSIS — E119 Type 2 diabetes mellitus without complications: Secondary | ICD-10-CM | POA: Diagnosis not present

## 2015-01-25 DIAGNOSIS — G629 Polyneuropathy, unspecified: Secondary | ICD-10-CM | POA: Diagnosis not present

## 2015-01-29 ENCOUNTER — Telehealth: Payer: Self-pay

## 2015-01-29 DIAGNOSIS — N183 Chronic kidney disease, stage 3 (moderate): Secondary | ICD-10-CM | POA: Diagnosis not present

## 2015-01-29 DIAGNOSIS — G629 Polyneuropathy, unspecified: Secondary | ICD-10-CM | POA: Diagnosis not present

## 2015-01-29 DIAGNOSIS — R269 Unspecified abnormalities of gait and mobility: Secondary | ICD-10-CM | POA: Diagnosis not present

## 2015-01-29 DIAGNOSIS — E119 Type 2 diabetes mellitus without complications: Secondary | ICD-10-CM | POA: Diagnosis not present

## 2015-01-29 DIAGNOSIS — S12600D Unspecified displaced fracture of seventh cervical vertebra, subsequent encounter for fracture with routine healing: Secondary | ICD-10-CM | POA: Diagnosis not present

## 2015-01-29 DIAGNOSIS — I2699 Other pulmonary embolism without acute cor pulmonale: Secondary | ICD-10-CM | POA: Diagnosis not present

## 2015-01-29 NOTE — Telephone Encounter (Signed)
[name] PT with [facility] left v/m requesting verbal order to renew certification for another 60 day period; present certification ends this week.Please advise.

## 2015-01-29 NOTE — Telephone Encounter (Signed)
plz give verbal order to extend PT another 60days

## 2015-01-30 NOTE — Telephone Encounter (Signed)
Verbal order given as instructed below.

## 2015-02-01 DIAGNOSIS — N183 Chronic kidney disease, stage 3 (moderate): Secondary | ICD-10-CM | POA: Diagnosis not present

## 2015-02-01 DIAGNOSIS — G629 Polyneuropathy, unspecified: Secondary | ICD-10-CM | POA: Diagnosis not present

## 2015-02-01 DIAGNOSIS — R269 Unspecified abnormalities of gait and mobility: Secondary | ICD-10-CM | POA: Diagnosis not present

## 2015-02-01 DIAGNOSIS — E119 Type 2 diabetes mellitus without complications: Secondary | ICD-10-CM | POA: Diagnosis not present

## 2015-02-01 DIAGNOSIS — I2699 Other pulmonary embolism without acute cor pulmonale: Secondary | ICD-10-CM | POA: Diagnosis not present

## 2015-02-01 DIAGNOSIS — S12600D Unspecified displaced fracture of seventh cervical vertebra, subsequent encounter for fracture with routine healing: Secondary | ICD-10-CM | POA: Diagnosis not present

## 2015-02-05 DIAGNOSIS — G629 Polyneuropathy, unspecified: Secondary | ICD-10-CM | POA: Diagnosis not present

## 2015-02-05 DIAGNOSIS — S12600D Unspecified displaced fracture of seventh cervical vertebra, subsequent encounter for fracture with routine healing: Secondary | ICD-10-CM | POA: Diagnosis not present

## 2015-02-05 DIAGNOSIS — R269 Unspecified abnormalities of gait and mobility: Secondary | ICD-10-CM | POA: Diagnosis not present

## 2015-02-05 DIAGNOSIS — E119 Type 2 diabetes mellitus without complications: Secondary | ICD-10-CM | POA: Diagnosis not present

## 2015-02-05 DIAGNOSIS — N183 Chronic kidney disease, stage 3 (moderate): Secondary | ICD-10-CM | POA: Diagnosis not present

## 2015-02-05 DIAGNOSIS — I2699 Other pulmonary embolism without acute cor pulmonale: Secondary | ICD-10-CM | POA: Diagnosis not present

## 2015-02-07 ENCOUNTER — Ambulatory Visit (INDEPENDENT_AMBULATORY_CARE_PROVIDER_SITE_OTHER): Payer: Medicare Other | Admitting: Family Medicine

## 2015-02-07 ENCOUNTER — Encounter: Payer: Self-pay | Admitting: Family Medicine

## 2015-02-07 VITALS — BP 108/72 | HR 80 | Temp 97.9°F | Wt 164.5 lb

## 2015-02-07 DIAGNOSIS — I2699 Other pulmonary embolism without acute cor pulmonale: Secondary | ICD-10-CM | POA: Diagnosis not present

## 2015-02-07 DIAGNOSIS — R600 Localized edema: Secondary | ICD-10-CM | POA: Insufficient documentation

## 2015-02-07 DIAGNOSIS — G629 Polyneuropathy, unspecified: Secondary | ICD-10-CM | POA: Diagnosis not present

## 2015-02-07 DIAGNOSIS — Z86711 Personal history of pulmonary embolism: Secondary | ICD-10-CM | POA: Diagnosis not present

## 2015-02-07 DIAGNOSIS — R269 Unspecified abnormalities of gait and mobility: Secondary | ICD-10-CM

## 2015-02-07 DIAGNOSIS — E119 Type 2 diabetes mellitus without complications: Secondary | ICD-10-CM | POA: Diagnosis not present

## 2015-02-07 DIAGNOSIS — M9903 Segmental and somatic dysfunction of lumbar region: Secondary | ICD-10-CM | POA: Diagnosis not present

## 2015-02-07 DIAGNOSIS — Z8701 Personal history of pneumonia (recurrent): Secondary | ICD-10-CM | POA: Diagnosis not present

## 2015-02-07 DIAGNOSIS — M955 Acquired deformity of pelvis: Secondary | ICD-10-CM | POA: Diagnosis not present

## 2015-02-07 DIAGNOSIS — M62830 Muscle spasm of back: Secondary | ICD-10-CM | POA: Diagnosis not present

## 2015-02-07 DIAGNOSIS — N183 Chronic kidney disease, stage 3 (moderate): Secondary | ICD-10-CM | POA: Diagnosis not present

## 2015-02-07 DIAGNOSIS — Z7901 Long term (current) use of anticoagulants: Secondary | ICD-10-CM | POA: Diagnosis not present

## 2015-02-07 DIAGNOSIS — E785 Hyperlipidemia, unspecified: Secondary | ICD-10-CM | POA: Diagnosis not present

## 2015-02-07 DIAGNOSIS — F0390 Unspecified dementia without behavioral disturbance: Secondary | ICD-10-CM | POA: Diagnosis not present

## 2015-02-07 DIAGNOSIS — F329 Major depressive disorder, single episode, unspecified: Secondary | ICD-10-CM | POA: Diagnosis not present

## 2015-02-07 DIAGNOSIS — S12600D Unspecified displaced fracture of seventh cervical vertebra, subsequent encounter for fracture with routine healing: Secondary | ICD-10-CM | POA: Diagnosis not present

## 2015-02-07 DIAGNOSIS — I2510 Atherosclerotic heart disease of native coronary artery without angina pectoris: Secondary | ICD-10-CM

## 2015-02-07 DIAGNOSIS — R531 Weakness: Secondary | ICD-10-CM | POA: Diagnosis not present

## 2015-02-07 DIAGNOSIS — M9905 Segmental and somatic dysfunction of pelvic region: Secondary | ICD-10-CM | POA: Diagnosis not present

## 2015-02-07 MED ORDER — DONEPEZIL HCL 10 MG PO TABS: 10.0000 mg | ORAL_TABLET | Freq: Every day | ORAL | Status: DC

## 2015-02-07 NOTE — Assessment & Plan Note (Addendum)
New over last week - ?CVI vs renal insuff related. Lungs clear doubt CHF. Not consistent with DVT. rec conservative management for now - elevation of legs, avoiding sodium in diet, increased water intake, and start using compression stockings (Rx provided today). Update if not improving with treatment, would start with labwork for edema.

## 2015-02-07 NOTE — Progress Notes (Signed)
Pre visit review using our clinic review tool, if applicable. No additional management support is needed unless otherwise documented below in the visit note. 

## 2015-02-07 NOTE — Patient Instructions (Addendum)
I think this is from venous insufficiency + renal insufficiency. Start compression stockings Elevate legs when you're seated Avoid salt in diet, increase water. Monitor and let me know if worsening. Return in 2 months for follow up, sooner if needed.  As you are tolerating aricept well, increase dose to 10mg  when you finish current dose.

## 2015-02-07 NOTE — Assessment & Plan Note (Addendum)
Continue walker use. HHPT extended 1 more month.

## 2015-02-07 NOTE — Assessment & Plan Note (Signed)
Increase aricept to 10mg  daily once he runs out of 5mg  dose.

## 2015-02-07 NOTE — Assessment & Plan Note (Addendum)
Off AC, doing well. Continue aspirin 81mg  daily.

## 2015-02-07 NOTE — Progress Notes (Signed)
BP 108/72 mmHg  Pulse 80  Temp(Src) 97.9 F (36.6 C) (Oral)  Wt 164 lb 8 oz (74.617 kg)   CC: 3 mo f/u visit  Subjective:    Patient ID: [name], male    DOB: 04-06-1931, 81 y.o.   MRN: [id number]  HPI: NASIRE REALI is a 81 y.o. male presenting on 02/07/2015 for Follow-up   Here for previously scheduled 3 mo f/u visit.   Coumadin was recently stopped after normal hypercoagulability panel (prior on coumadin aftr 1 unprovoked PE in fmhx.   Persistent nocturia. Doing well during the day. No dx BPH. + urge incontinence.   Over last 1 week noticing increased pedal edema in afternoons. Denies dyspnea, chest pain. Mild cough.   Dementia - recent dx with MMSE 19-23/30. ?alz vs vascular vs parkinsonism. Decreased reading noted. Decreased interaction with other ppl noted. Increased sleeping noted. Last visit we started aricept 5mg  daily.   Relevant past medical, surgical, family and social history reviewed and updated as indicated. Interim medical history since our last visit reviewed. Allergies and medications reviewed and updated. Current Outpatient Prescriptions on File Prior to Visit  Medication Sig  . albuterol (PROVENTIL HFA;VENTOLIN HFA) 108 (90 BASE) MCG/ACT inhaler Inhale 2 puffs into the lungs every 6 (six) hours as needed for wheezing or shortness of breath.  Marland Kitchen aspirin EC 81 MG tablet Take 81 mg by mouth daily.  Marland Kitchen atorvastatin (LIPITOR) 40 MG tablet Take 40 mg by mouth at bedtime.  . Cholecalciferol (VITAMIN D) 2000 UNITS CAPS Take 1 capsule by mouth daily.  . Coenzyme Q10 (COQ-10) 100 MG CAPS Take 1 capsule by mouth at bedtime.   . Cyanocobalamin (B-12 PO) Take 10,000 mcg by mouth every other day.   Marland Kitchen dexlansoprazole (DEXILANT) 60 MG capsule Take 60 mg by mouth daily.  Marland Kitchen docusate sodium (COLACE) 100 MG capsule Take 100 mg by mouth daily.  . fenofibrate (TRICOR) 145 MG tablet TAKE 1 TABLET DAILY  . fexofenadine (ALLEGRA) 180 MG tablet Take 180 mg by mouth every  evening.   . gabapentin (NEURONTIN) 100 MG capsule Take 100 mg by mouth as directed. 200mg  in am and 100mg  in pm  . guaiFENesin (MUCINEX) 600 MG 12 hr tablet Take 1 tablet (600 mg total) by mouth 2 (two) times daily. (Patient taking differently: Take 600 mg by mouth 2 (two) times daily as needed. )  . Multiple Vitamins-Minerals (MULTIVITAMIN PO) Take by mouth daily.  Marland Kitchen oxybutynin (DITROPAN) 5 MG tablet Take 5 mg by mouth 2 (two) times daily.   . psyllium (METAMUCIL SMOOTH TEXTURE) 28 % packet Take 1 packet by mouth daily.  Marland Kitchen venlafaxine (EFFEXOR) 75 MG tablet Take 75 mg by mouth daily.   No current facility-administered medications on file prior to visit.    Review of Systems Per HPI unless specifically indicated above     Objective:    BP 108/72 mmHg  Pulse 80  Temp(Src) 97.9 F (36.6 C) (Oral)  Wt 164 lb 8 oz (74.617 kg)  Wt Readings from Last 3 Encounters:  02/07/15 164 lb 8 oz (74.617 kg)  01/15/15 166 lb 8 oz (75.524 kg)  12/15/14 153 lb 12 oz (69.741 kg)    Physical Exam  Constitutional: He appears well-developed and well-nourished. No distress.  HENT:  Mouth/Throat: Oropharynx is clear and moist. No oropharyngeal exudate.  Cardiovascular: Normal rate, regular rhythm, normal heart sounds and intact distal pulses.   No murmur heard. Pulmonary/Chest: Effort normal and breath sounds  normal. No respiratory distress. He has no wheezes. He has no rales.  Musculoskeletal: He exhibits edema (1+ pitting RLE, tr pitting LLE).  2+ DP bilaterally No palpable cords  Skin: Skin is warm and dry. No rash noted.  Psychiatric: He has a normal mood and affect.  Nursing note and vitals reviewed.  Results for orders placed or performed in visit on 12/28/14  Sedimentation rate  Result Value Ref Range   Sed Rate 20 0 - 20 mm/hr  Hypercoagulable panel, comprehensive  Result Value Ref Range   AntiThromb III Func 77 76 - 126 %   Protein C, Total 80 72 - 160 %   Protein S Total 79 60 -  150 %   PTT Lupus Anticoagulant 30.4 28.0 - 43.0 secs   PTTLA 4:1 Mix NOT APPL 28.0 - 43.0 secs   PTTLA Confirmation NOT APPL <8.0 secs   DRVVT 35.8 <42.9 secs   DRVVT 1:1 Mix NOT APPL <42.9 secs   Drvvt confirmation NOT APPL <1.15 Ratio   Lupus Anticoagulant NOT DETECTED NOT DETECTED   Beta-2 Glyco I IgG 7 <20 G Units   Beta-2-Glycoprotein I IgM 6 <20 M Units   Beta-2-Glycoprotein I IgA 6 <20 A Units   Anticardiolipin IgG 7 <23 GPL U/mL   Anticardiolipin IgM 2 <11 MPL U/mL   Anticardiolipin IgA 2 <22 APL U/mL   Result REPORT    Interpretation REPORT    Reviewer REPORT    Protein C Activity 128 75 - 133 %   Protein S Activity 88 69 - 129 %   Result REPORT    Interpretation REPORT    Reviewer REPORT       Assessment & Plan:   Problem List Items Addressed This Visit    Dementia    Increase aricept to 10mg  daily once he runs out of 5mg  dose.      Relevant Medications   donepezil (ARICEPT) 10 MG tablet   Pedal edema - Primary    New over last week - ?CVI vs renal insuff related. Lungs clear doubt CHF. Not consistent with DVT. rec conservative management for now - elevation of legs, avoiding sodium in diet, increased water intake, and start using compression stockings (Rx provided today). Update if not improving with treatment, would start with labwork for edema.      Personal history of pulmonary embolism    Off AC, doing well. Continue aspirin 81mg  daily.      UNSTEADY GAIT    Continue walker use. HHPT extended 1 more month.          Follow up plan: No Follow-up on file.

## 2015-02-08 DIAGNOSIS — S12600D Unspecified displaced fracture of seventh cervical vertebra, subsequent encounter for fracture with routine healing: Secondary | ICD-10-CM | POA: Diagnosis not present

## 2015-02-08 DIAGNOSIS — E119 Type 2 diabetes mellitus without complications: Secondary | ICD-10-CM | POA: Diagnosis not present

## 2015-02-08 DIAGNOSIS — I2699 Other pulmonary embolism without acute cor pulmonale: Secondary | ICD-10-CM | POA: Diagnosis not present

## 2015-02-08 DIAGNOSIS — N183 Chronic kidney disease, stage 3 (moderate): Secondary | ICD-10-CM | POA: Diagnosis not present

## 2015-02-08 DIAGNOSIS — R269 Unspecified abnormalities of gait and mobility: Secondary | ICD-10-CM | POA: Diagnosis not present

## 2015-02-08 DIAGNOSIS — G629 Polyneuropathy, unspecified: Secondary | ICD-10-CM | POA: Diagnosis not present

## 2015-02-09 DIAGNOSIS — M9903 Segmental and somatic dysfunction of lumbar region: Secondary | ICD-10-CM | POA: Diagnosis not present

## 2015-02-09 DIAGNOSIS — M955 Acquired deformity of pelvis: Secondary | ICD-10-CM | POA: Diagnosis not present

## 2015-02-09 DIAGNOSIS — M9905 Segmental and somatic dysfunction of pelvic region: Secondary | ICD-10-CM | POA: Diagnosis not present

## 2015-02-09 DIAGNOSIS — M62830 Muscle spasm of back: Secondary | ICD-10-CM | POA: Diagnosis not present

## 2015-02-12 ENCOUNTER — Other Ambulatory Visit: Payer: Self-pay | Admitting: Gastroenterology

## 2015-02-12 DIAGNOSIS — R269 Unspecified abnormalities of gait and mobility: Secondary | ICD-10-CM | POA: Diagnosis not present

## 2015-02-12 DIAGNOSIS — M9905 Segmental and somatic dysfunction of pelvic region: Secondary | ICD-10-CM | POA: Diagnosis not present

## 2015-02-12 DIAGNOSIS — M9903 Segmental and somatic dysfunction of lumbar region: Secondary | ICD-10-CM | POA: Diagnosis not present

## 2015-02-12 DIAGNOSIS — E119 Type 2 diabetes mellitus without complications: Secondary | ICD-10-CM | POA: Diagnosis not present

## 2015-02-12 DIAGNOSIS — G629 Polyneuropathy, unspecified: Secondary | ICD-10-CM | POA: Diagnosis not present

## 2015-02-12 DIAGNOSIS — I2699 Other pulmonary embolism without acute cor pulmonale: Secondary | ICD-10-CM | POA: Diagnosis not present

## 2015-02-12 DIAGNOSIS — M62830 Muscle spasm of back: Secondary | ICD-10-CM | POA: Diagnosis not present

## 2015-02-12 DIAGNOSIS — S12600D Unspecified displaced fracture of seventh cervical vertebra, subsequent encounter for fracture with routine healing: Secondary | ICD-10-CM | POA: Diagnosis not present

## 2015-02-12 DIAGNOSIS — M955 Acquired deformity of pelvis: Secondary | ICD-10-CM | POA: Diagnosis not present

## 2015-02-12 DIAGNOSIS — N183 Chronic kidney disease, stage 3 (moderate): Secondary | ICD-10-CM | POA: Diagnosis not present

## 2015-02-14 DIAGNOSIS — M955 Acquired deformity of pelvis: Secondary | ICD-10-CM | POA: Diagnosis not present

## 2015-02-14 DIAGNOSIS — M62830 Muscle spasm of back: Secondary | ICD-10-CM | POA: Diagnosis not present

## 2015-02-14 DIAGNOSIS — M9905 Segmental and somatic dysfunction of pelvic region: Secondary | ICD-10-CM | POA: Diagnosis not present

## 2015-02-14 DIAGNOSIS — H35373 Puckering of macula, bilateral: Secondary | ICD-10-CM | POA: Diagnosis not present

## 2015-02-14 DIAGNOSIS — M9903 Segmental and somatic dysfunction of lumbar region: Secondary | ICD-10-CM | POA: Diagnosis not present

## 2015-02-15 DIAGNOSIS — I2699 Other pulmonary embolism without acute cor pulmonale: Secondary | ICD-10-CM | POA: Diagnosis not present

## 2015-02-15 DIAGNOSIS — S12600D Unspecified displaced fracture of seventh cervical vertebra, subsequent encounter for fracture with routine healing: Secondary | ICD-10-CM | POA: Diagnosis not present

## 2015-02-15 DIAGNOSIS — E119 Type 2 diabetes mellitus without complications: Secondary | ICD-10-CM | POA: Diagnosis not present

## 2015-02-15 DIAGNOSIS — N183 Chronic kidney disease, stage 3 (moderate): Secondary | ICD-10-CM | POA: Diagnosis not present

## 2015-02-16 DIAGNOSIS — R269 Unspecified abnormalities of gait and mobility: Secondary | ICD-10-CM | POA: Diagnosis not present

## 2015-02-16 DIAGNOSIS — I2699 Other pulmonary embolism without acute cor pulmonale: Secondary | ICD-10-CM | POA: Diagnosis not present

## 2015-02-16 DIAGNOSIS — S12600D Unspecified displaced fracture of seventh cervical vertebra, subsequent encounter for fracture with routine healing: Secondary | ICD-10-CM | POA: Diagnosis not present

## 2015-02-16 DIAGNOSIS — E119 Type 2 diabetes mellitus without complications: Secondary | ICD-10-CM | POA: Diagnosis not present

## 2015-02-16 DIAGNOSIS — G629 Polyneuropathy, unspecified: Secondary | ICD-10-CM | POA: Diagnosis not present

## 2015-02-16 DIAGNOSIS — N183 Chronic kidney disease, stage 3 (moderate): Secondary | ICD-10-CM | POA: Diagnosis not present

## 2015-02-19 DIAGNOSIS — E119 Type 2 diabetes mellitus without complications: Secondary | ICD-10-CM | POA: Diagnosis not present

## 2015-02-19 DIAGNOSIS — S12600D Unspecified displaced fracture of seventh cervical vertebra, subsequent encounter for fracture with routine healing: Secondary | ICD-10-CM | POA: Diagnosis not present

## 2015-02-19 DIAGNOSIS — I2699 Other pulmonary embolism without acute cor pulmonale: Secondary | ICD-10-CM | POA: Diagnosis not present

## 2015-02-19 DIAGNOSIS — N183 Chronic kidney disease, stage 3 (moderate): Secondary | ICD-10-CM | POA: Diagnosis not present

## 2015-02-19 DIAGNOSIS — G629 Polyneuropathy, unspecified: Secondary | ICD-10-CM | POA: Diagnosis not present

## 2015-02-19 DIAGNOSIS — R269 Unspecified abnormalities of gait and mobility: Secondary | ICD-10-CM | POA: Diagnosis not present

## 2015-02-21 DIAGNOSIS — M9905 Segmental and somatic dysfunction of pelvic region: Secondary | ICD-10-CM | POA: Diagnosis not present

## 2015-02-21 DIAGNOSIS — M955 Acquired deformity of pelvis: Secondary | ICD-10-CM | POA: Diagnosis not present

## 2015-02-21 DIAGNOSIS — M9903 Segmental and somatic dysfunction of lumbar region: Secondary | ICD-10-CM | POA: Diagnosis not present

## 2015-02-21 DIAGNOSIS — S12600D Unspecified displaced fracture of seventh cervical vertebra, subsequent encounter for fracture with routine healing: Secondary | ICD-10-CM | POA: Diagnosis not present

## 2015-02-21 DIAGNOSIS — R269 Unspecified abnormalities of gait and mobility: Secondary | ICD-10-CM | POA: Diagnosis not present

## 2015-02-21 DIAGNOSIS — M62830 Muscle spasm of back: Secondary | ICD-10-CM | POA: Diagnosis not present

## 2015-02-21 DIAGNOSIS — N183 Chronic kidney disease, stage 3 (moderate): Secondary | ICD-10-CM | POA: Diagnosis not present

## 2015-02-21 DIAGNOSIS — E119 Type 2 diabetes mellitus without complications: Secondary | ICD-10-CM | POA: Diagnosis not present

## 2015-02-21 DIAGNOSIS — I2699 Other pulmonary embolism without acute cor pulmonale: Secondary | ICD-10-CM | POA: Diagnosis not present

## 2015-02-21 DIAGNOSIS — G629 Polyneuropathy, unspecified: Secondary | ICD-10-CM | POA: Diagnosis not present

## 2015-02-25 ENCOUNTER — Other Ambulatory Visit: Payer: Self-pay | Admitting: Family Medicine

## 2015-02-26 DIAGNOSIS — L821 Other seborrheic keratosis: Secondary | ICD-10-CM | POA: Diagnosis not present

## 2015-02-26 DIAGNOSIS — L659 Nonscarring hair loss, unspecified: Secondary | ICD-10-CM | POA: Diagnosis not present

## 2015-02-26 DIAGNOSIS — L814 Other melanin hyperpigmentation: Secondary | ICD-10-CM | POA: Diagnosis not present

## 2015-02-26 DIAGNOSIS — L638 Other alopecia areata: Secondary | ICD-10-CM | POA: Diagnosis not present

## 2015-02-26 DIAGNOSIS — L570 Actinic keratosis: Secondary | ICD-10-CM | POA: Diagnosis not present

## 2015-02-27 DIAGNOSIS — R269 Unspecified abnormalities of gait and mobility: Secondary | ICD-10-CM | POA: Diagnosis not present

## 2015-02-27 DIAGNOSIS — E119 Type 2 diabetes mellitus without complications: Secondary | ICD-10-CM | POA: Diagnosis not present

## 2015-02-27 DIAGNOSIS — I2699 Other pulmonary embolism without acute cor pulmonale: Secondary | ICD-10-CM | POA: Diagnosis not present

## 2015-02-27 DIAGNOSIS — N183 Chronic kidney disease, stage 3 (moderate): Secondary | ICD-10-CM | POA: Diagnosis not present

## 2015-02-27 DIAGNOSIS — G629 Polyneuropathy, unspecified: Secondary | ICD-10-CM | POA: Diagnosis not present

## 2015-02-27 DIAGNOSIS — S12600D Unspecified displaced fracture of seventh cervical vertebra, subsequent encounter for fracture with routine healing: Secondary | ICD-10-CM | POA: Diagnosis not present

## 2015-02-28 ENCOUNTER — Telehealth: Payer: Self-pay | Admitting: Family Medicine

## 2015-02-28 NOTE — Telephone Encounter (Signed)
Spoke with patient's wife. She said [facility] called and said that fenofibrate had been denied/refused. I advised that I refilled it on 02/26/15 #90 with 1 refill and that they confirmed receiving it, so I wasn't sure what the issue was. She said she would call them and look into it further.

## 2015-02-28 NOTE — Telephone Encounter (Signed)
Mrs Hoard left v/m that [facility] notified pt fenofibrate was refused for refill;Left v/m per DPR that fenofibrate was approved on 02/26/15; pt [facility] and if they do not have refill [facility] should contact [place].Marland Kitchen

## 2015-02-28 NOTE — Telephone Encounter (Signed)
Wife called in, said she missed a call. She is wanting to know h[facility] Dr [facility] denied a rx. Please call at home number, thanks

## 2015-03-01 DIAGNOSIS — I2699 Other pulmonary embolism without acute cor pulmonale: Secondary | ICD-10-CM | POA: Diagnosis not present

## 2015-03-01 DIAGNOSIS — E119 Type 2 diabetes mellitus without complications: Secondary | ICD-10-CM | POA: Diagnosis not present

## 2015-03-01 DIAGNOSIS — N183 Chronic kidney disease, stage 3 (moderate): Secondary | ICD-10-CM | POA: Diagnosis not present

## 2015-03-01 DIAGNOSIS — S12600D Unspecified displaced fracture of seventh cervical vertebra, subsequent encounter for fracture with routine healing: Secondary | ICD-10-CM | POA: Diagnosis not present

## 2015-03-01 DIAGNOSIS — G629 Polyneuropathy, unspecified: Secondary | ICD-10-CM | POA: Diagnosis not present

## 2015-03-01 DIAGNOSIS — R269 Unspecified abnormalities of gait and mobility: Secondary | ICD-10-CM | POA: Diagnosis not present

## 2015-03-06 DIAGNOSIS — N183 Chronic kidney disease, stage 3 (moderate): Secondary | ICD-10-CM | POA: Diagnosis not present

## 2015-03-06 DIAGNOSIS — I2699 Other pulmonary embolism without acute cor pulmonale: Secondary | ICD-10-CM | POA: Diagnosis not present

## 2015-03-06 DIAGNOSIS — S12600D Unspecified displaced fracture of seventh cervical vertebra, subsequent encounter for fracture with routine healing: Secondary | ICD-10-CM | POA: Diagnosis not present

## 2015-03-06 DIAGNOSIS — G629 Polyneuropathy, unspecified: Secondary | ICD-10-CM | POA: Diagnosis not present

## 2015-03-06 DIAGNOSIS — E119 Type 2 diabetes mellitus without complications: Secondary | ICD-10-CM | POA: Diagnosis not present

## 2015-03-06 DIAGNOSIS — R269 Unspecified abnormalities of gait and mobility: Secondary | ICD-10-CM | POA: Diagnosis not present

## 2015-03-08 DIAGNOSIS — N183 Chronic kidney disease, stage 3 (moderate): Secondary | ICD-10-CM | POA: Diagnosis not present

## 2015-03-08 DIAGNOSIS — S12600D Unspecified displaced fracture of seventh cervical vertebra, subsequent encounter for fracture with routine healing: Secondary | ICD-10-CM | POA: Diagnosis not present

## 2015-03-08 DIAGNOSIS — G629 Polyneuropathy, unspecified: Secondary | ICD-10-CM | POA: Diagnosis not present

## 2015-03-08 DIAGNOSIS — E119 Type 2 diabetes mellitus without complications: Secondary | ICD-10-CM | POA: Diagnosis not present

## 2015-03-08 DIAGNOSIS — R269 Unspecified abnormalities of gait and mobility: Secondary | ICD-10-CM | POA: Diagnosis not present

## 2015-03-08 DIAGNOSIS — I2699 Other pulmonary embolism without acute cor pulmonale: Secondary | ICD-10-CM | POA: Diagnosis not present

## 2015-03-09 HISTORY — PX: CARDIOVASCULAR STRESS TEST: SHX262

## 2015-03-13 DIAGNOSIS — G629 Polyneuropathy, unspecified: Secondary | ICD-10-CM | POA: Diagnosis not present

## 2015-03-13 DIAGNOSIS — I2699 Other pulmonary embolism without acute cor pulmonale: Secondary | ICD-10-CM | POA: Diagnosis not present

## 2015-03-13 DIAGNOSIS — E119 Type 2 diabetes mellitus without complications: Secondary | ICD-10-CM | POA: Diagnosis not present

## 2015-03-13 DIAGNOSIS — R269 Unspecified abnormalities of gait and mobility: Secondary | ICD-10-CM | POA: Diagnosis not present

## 2015-03-13 DIAGNOSIS — S12600D Unspecified displaced fracture of seventh cervical vertebra, subsequent encounter for fracture with routine healing: Secondary | ICD-10-CM | POA: Diagnosis not present

## 2015-03-13 DIAGNOSIS — N183 Chronic kidney disease, stage 3 (moderate): Secondary | ICD-10-CM | POA: Diagnosis not present

## 2015-03-16 DIAGNOSIS — R269 Unspecified abnormalities of gait and mobility: Secondary | ICD-10-CM | POA: Diagnosis not present

## 2015-03-16 DIAGNOSIS — G629 Polyneuropathy, unspecified: Secondary | ICD-10-CM | POA: Diagnosis not present

## 2015-03-16 DIAGNOSIS — N183 Chronic kidney disease, stage 3 (moderate): Secondary | ICD-10-CM | POA: Diagnosis not present

## 2015-03-16 DIAGNOSIS — E119 Type 2 diabetes mellitus without complications: Secondary | ICD-10-CM | POA: Diagnosis not present

## 2015-03-16 DIAGNOSIS — I2699 Other pulmonary embolism without acute cor pulmonale: Secondary | ICD-10-CM | POA: Diagnosis not present

## 2015-03-16 DIAGNOSIS — S12600D Unspecified displaced fracture of seventh cervical vertebra, subsequent encounter for fracture with routine healing: Secondary | ICD-10-CM | POA: Diagnosis not present

## 2015-03-19 ENCOUNTER — Telehealth: Payer: Self-pay | Admitting: Family Medicine

## 2015-03-19 NOTE — Telephone Encounter (Signed)
Message left for Stacy to return my call.  

## 2015-03-19 NOTE — Telephone Encounter (Signed)
Can we get more details? She's with PT I think. Ok to do.

## 2015-03-19 NOTE — Telephone Encounter (Signed)
Erline Levine called wanting to get a extension for 3 weeks for 1 time weekly

## 2015-03-20 NOTE — Telephone Encounter (Signed)
Stacy called and confirmed that she was PT.

## 2015-03-20 NOTE — Telephone Encounter (Signed)
Message left notifying Marzetta Board and asked to return my call to confirm she was PT.

## 2015-03-22 DIAGNOSIS — I2699 Other pulmonary embolism without acute cor pulmonale: Secondary | ICD-10-CM | POA: Diagnosis not present

## 2015-03-22 DIAGNOSIS — R269 Unspecified abnormalities of gait and mobility: Secondary | ICD-10-CM | POA: Diagnosis not present

## 2015-03-22 DIAGNOSIS — E119 Type 2 diabetes mellitus without complications: Secondary | ICD-10-CM | POA: Diagnosis not present

## 2015-03-22 DIAGNOSIS — S12600D Unspecified displaced fracture of seventh cervical vertebra, subsequent encounter for fracture with routine healing: Secondary | ICD-10-CM | POA: Diagnosis not present

## 2015-03-22 DIAGNOSIS — G629 Polyneuropathy, unspecified: Secondary | ICD-10-CM | POA: Diagnosis not present

## 2015-03-22 DIAGNOSIS — N183 Chronic kidney disease, stage 3 (moderate): Secondary | ICD-10-CM | POA: Diagnosis not present

## 2015-03-26 ENCOUNTER — Encounter: Payer: Self-pay | Admitting: Internal Medicine

## 2015-03-26 ENCOUNTER — Ambulatory Visit (INDEPENDENT_AMBULATORY_CARE_PROVIDER_SITE_OTHER): Payer: Medicare Other | Admitting: Internal Medicine

## 2015-03-26 VITALS — BP 128/58 | HR 68 | Temp 98.0°F | Wt 166.0 lb

## 2015-03-26 DIAGNOSIS — I2510 Atherosclerotic heart disease of native coronary artery without angina pectoris: Secondary | ICD-10-CM | POA: Diagnosis not present

## 2015-03-26 DIAGNOSIS — R21 Rash and other nonspecific skin eruption: Secondary | ICD-10-CM

## 2015-03-26 NOTE — Progress Notes (Signed)
Subjective:    Patient ID: [name], male    DOB: 1931-07-25, 81 y.o.   MRN: [id number]  HPI  Pt presents to the clinic today with c/o a rash. He noticed this 1 week ago. The rash started on his abdomen and upper thighs. It has now spread all over his body. The rash is very itchy. He reports it looks like patches of small red bumps. He has never had a rash like this before. He denies changes in soaps, lotions or detergents. He was started on Aricept 3-4 weeks ago, recently increased to 10 mg daily. He has been putting Bactroban on it without any relief.  Review of Systems      Past Medical History  Diagnosis Date  . GERD (gastroesophageal reflux disease)     h/o PUD  . CAD (coronary artery disease)     cath 2000 30% single vessel, normal nuclear stress test 12/03/2010, no evidence ischemia  . CKD (chronic kidney disease) stage 3, GFR 30-59 ml/min     baseline Cr 1.7  . Depression   . Hx pulmonary embolism 08/1991    negative hypercoagulable panel 12/2014  . History of phlebitis   . Allergic rhinitis   . Urge incontinence   . Elevated PSA     previous-normalized (followed by Dr. Jeffie Pollock, rec no repeat unless urinary sxs)  . HLD (hyperlipidemia)     hypertriglyceridemia  . Asthma     remote  . Arthritis   . Blood transfusion 1990's  . Schatzki's ring 02/2012    s/p dilation [name]  . History of fracture of right hip   . Hearing loss 06/2012    eval - rec annual exam  . Essential tremor 09/27/2008  . Prediabetes   . DISH (diffuse idiopathic skeletal hyperostosis) 03/2013    lumbar spine on xray  . DDD (degenerative disc disease) 03/2013    by CT, diffuse multilevel cervical and lumbar spondylosis  . Osteoporosis 11/2013    DEXA T score -2.7  . Macrocytic anemia     stable B12/folate and periph smear 05/2013  . Sleep apnea     no CPAP- sleep apnea "cleared up 15 years ago"  . Dizziness     multifactorial, s/p PT at [facility] 06/2014 with HEP  . History of pyelonephritis  05/2014    hospitalization with sepsis  . Closed C7 fracture 11/06/2014    Current Outpatient Prescriptions  Medication Sig Dispense Refill  . albuterol (PROVENTIL HFA;VENTOLIN HFA) 108 (90 BASE) MCG/ACT inhaler Inhale 2 puffs into the lungs every 6 (six) hours as needed for wheezing or shortness of breath.    Marland Kitchen aspirin EC 81 MG tablet Take 81 mg by mouth daily.    Marland Kitchen atorvastatin (LIPITOR) 40 MG tablet TAKE 1 TABLET AT BEDTIME (Patient taking differently: TAKE 1/2 TABLET AT BEDTIME) 90 tablet 1  . Cholecalciferol (VITAMIN D) 2000 UNITS CAPS Take 1 capsule by mouth daily.    . Coenzyme Q10 (COQ-10) 100 MG CAPS Take 1 capsule by mouth at bedtime.     . Cyanocobalamin (B-12 PO) Take 10,000 mcg by mouth every other day.     Marland Kitchen DEXILANT 60 MG capsule TAKE 1 CAPSULE DAILY 90 capsule 2  . docusate sodium (COLACE) 100 MG capsule Take 100 mg by mouth daily.    Marland Kitchen donepezil (ARICEPT) 10 MG tablet Take 1 tablet (10 mg total) by mouth at bedtime. 30 tablet 3  . fenofibrate (TRICOR) 145 MG tablet TAKE 1 TABLET  DAILY 90 tablet 1  . fexofenadine (ALLEGRA) 180 MG tablet Take 180 mg by mouth every evening.     . gabapentin (NEURONTIN) 100 MG capsule Take 100 mg by mouth as directed. 200mg  in am and 100mg  in pm    . guaiFENesin (MUCINEX) 600 MG 12 hr tablet Take 1 tablet (600 mg total) by mouth 2 (two) times daily. (Patient taking differently: Take 600 mg by mouth 2 (two) times daily as needed. ) 20 tablet 0  . Multiple Vitamins-Minerals (MULTIVITAMIN PO) Take by mouth daily.    Marland Kitchen oxybutynin (DITROPAN) 5 MG tablet Take 5 mg by mouth 2 (two) times daily.     . psyllium (METAMUCIL SMOOTH TEXTURE) 28 % packet Take 1 packet by mouth daily.    Marland Kitchen venlafaxine (EFFEXOR) 75 MG tablet Take 75 mg by mouth daily.     No current facility-administered medications for this visit.    Allergies  Allergen Reactions  . Lactose Intolerance (Gi)   . Nizatidine     REACTION: Rash (Axid)  . Penicillins     REACTION: Rash  .  Sulfadiazine     REACTION: Hives    Family History  Problem Relation Age of Onset  . Stroke Mother   . Hypertension Mother   . Cancer Brother     prostate with mets  . Blindness Brother     legally  . Diabetes Brother   . Pulmonary embolism Sister     from shoulder operation  . Alcohol abuse Brother   . Colon cancer Neg Hx   . Esophageal cancer Neg Hx   . Rectal cancer Neg Hx   . Stomach cancer Neg Hx     History   Social History  . Marital Status: Married    Spouse Name: N/A  . Number of Children: 2  . Years of Education: N/A   Occupational History  . 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 Smoking status: Never Smoker   . Smokeless tobacco: Never Used  . Alcohol Use: No  . Drug Use: No  . Sexual Activity: Not on file   Other Topics Concern  . Not on file   Social History Narrative   Married with 2 children   Husband of Jayant Kriz    Retired: was principal    Activity: walks dog 2-3 times daily about 7min, frequent stops    Diet: some water, good fruits/vegetables, fish 1x/wk, no sodas.      Advanced directives: Chauncey Reading is wife, [name]. Does not want prolonged life support if terminal     Constitutional: Denies fever, malaise, fatigue, headache or abrupt weight changes.  HEENT: Denies eye pain, eye redness, ear pain, ringing in the ears, wax buildup, runny nose, nasal congestion, bloody nose, or sore throat. Respiratory: Denies difficulty breathing, shortness of breath, cough or sputum production.   Cardiovascular: Denies chest pain, chest tightness, palpitations or swelling in the hands or feet.  Skin: Pt reports rash. Denies ulcercations.  Neurological: Denies dizziness, difficulty with memory, difficulty with speech or problems with balance and coordination.   No other specific complaints in a complete review of systems (except as listed in HPI above).  Objective:   Physical Exam  BP 128/58 mmHg  Pulse 68   Temp(Src) 98 F (36.7 C) (Oral)  Wt 166 lb (75.297 kg)  SpO2 98%  Wt Readings from Last 3 Encounters:  03/26/15 166 lb (75.297 kg)  02/07/15 164 lb 8  oz (74.617 kg)  01/15/15 166 lb 8 oz (75.524 kg)    General: Appears his stated age, well developed, well nourished in NAD. Skin: Scattered maculopapular rash noted on legs, back and abdomen. Does not blanch. Cardiovascular: Normal rate and rhythm. S1,S2 noted.  No murmur, rubs or gallops noted.  Pulmonary/Chest: Normal effort and positive vesicular breath sounds. No respiratory distress. No wheezes, rales or ronchi noted.   BMET    Component Value Date/Time   NA 138 12/15/2014 1503   NA 137 11/17/2014   K 4.6 12/15/2014 1503   CL 102 12/15/2014 1503   CO2 26 12/15/2014 1503   GLUCOSE 96 12/15/2014 1503   BUN 20 12/15/2014 1503   BUN 46* 11/17/2014   CREATININE 1.12 12/15/2014 1503   CREATININE 1.7* 11/17/2014   CREATININE 1.31 11/09/2014 0537   CALCIUM 9.4 12/15/2014 1503   GFRNONAA 49* 11/09/2014 0537   GFRAA 57* 11/09/2014 0537    Lipid Panel     Component Value Date/Time   CHOL 139 10/30/2014 0940   TRIG 183.0* 10/30/2014 0940   HDL 25.50* 10/30/2014 0940   CHOLHDL 5 10/30/2014 0940   VLDL 36.6 10/30/2014 0940   LDLCALC 77 10/30/2014 0940    CBC    Component Value Date/Time   WBC 6.4 12/15/2014 1503   WBC 6.8 11/17/2014   RBC 3.34* 12/15/2014 1503   HGB 11.4* 12/15/2014 1503   HCT 34.8* 12/15/2014 1503   PLT 225 12/15/2014 1503   MCV 104.2* 12/15/2014 1503   MCH 34.1* 12/15/2014 1503   MCHC 32.8 12/15/2014 1503   RDW 14.8 12/15/2014 1503   LYMPHSABS 1.9 12/15/2014 1503   MONOABS 0.4 12/15/2014 1503   EOSABS 0.2 12/15/2014 1503   BASOSABS 0.1 12/15/2014 1503    Hgb A1C Lab Results  Component Value Date   HGBA1C 5.5 06/02/2013         Assessment & Plan:   Rash:  Unknown substance Could be a reaction to the Aricept but not severe 80 mg Depo IM today Take Allegra BID x 3 days then back  to once daily If rash persist, follow up with PCP  RTC as needed or if symptoms persist or worsen

## 2015-03-26 NOTE — Progress Notes (Signed)
Pre visit review using our clinic review tool, if applicable. No additional management support is needed unless otherwise documented below in the visit note. 

## 2015-03-26 NOTE — Patient Instructions (Signed)

## 2015-03-28 DIAGNOSIS — G629 Polyneuropathy, unspecified: Secondary | ICD-10-CM | POA: Diagnosis not present

## 2015-03-28 DIAGNOSIS — R269 Unspecified abnormalities of gait and mobility: Secondary | ICD-10-CM | POA: Diagnosis not present

## 2015-03-28 DIAGNOSIS — S12600D Unspecified displaced fracture of seventh cervical vertebra, subsequent encounter for fracture with routine healing: Secondary | ICD-10-CM | POA: Diagnosis not present

## 2015-03-28 DIAGNOSIS — I2699 Other pulmonary embolism without acute cor pulmonale: Secondary | ICD-10-CM | POA: Diagnosis not present

## 2015-03-28 DIAGNOSIS — N183 Chronic kidney disease, stage 3 (moderate): Secondary | ICD-10-CM | POA: Diagnosis not present

## 2015-03-28 DIAGNOSIS — E119 Type 2 diabetes mellitus without complications: Secondary | ICD-10-CM | POA: Diagnosis not present

## 2015-03-29 ENCOUNTER — Emergency Department (HOSPITAL_COMMUNITY): Payer: Medicare Other

## 2015-03-29 ENCOUNTER — Encounter (HOSPITAL_COMMUNITY): Payer: Self-pay | Admitting: *Deleted

## 2015-03-29 ENCOUNTER — Observation Stay (HOSPITAL_COMMUNITY)
Admission: EM | Admit: 2015-03-29 | Discharge: 2015-03-30 | Disposition: A | Discharge status: Home / Self Care | Payer: Medicare Other | Attending: Internal Medicine | Admitting: Internal Medicine

## 2015-03-29 DIAGNOSIS — R0789 Other chest pain: Principal | ICD-10-CM | POA: Insufficient documentation

## 2015-03-29 DIAGNOSIS — I2510 Atherosclerotic heart disease of native coronary artery without angina pectoris: Secondary | ICD-10-CM | POA: Insufficient documentation

## 2015-03-29 DIAGNOSIS — G250 Essential tremor: Secondary | ICD-10-CM | POA: Diagnosis not present

## 2015-03-29 DIAGNOSIS — I5032 Chronic diastolic (congestive) heart failure: Secondary | ICD-10-CM

## 2015-03-29 DIAGNOSIS — K219 Gastro-esophageal reflux disease without esophagitis: Secondary | ICD-10-CM | POA: Diagnosis not present

## 2015-03-29 DIAGNOSIS — N179 Acute kidney failure, unspecified: Secondary | ICD-10-CM | POA: Diagnosis not present

## 2015-03-29 DIAGNOSIS — R51 Headache: Secondary | ICD-10-CM | POA: Diagnosis not present

## 2015-03-29 DIAGNOSIS — E785 Hyperlipidemia, unspecified: Secondary | ICD-10-CM | POA: Insufficient documentation

## 2015-03-29 DIAGNOSIS — N3941 Urge incontinence: Secondary | ICD-10-CM | POA: Diagnosis present

## 2015-03-29 DIAGNOSIS — N183 Chronic kidney disease, stage 3 unspecified: Secondary | ICD-10-CM | POA: Diagnosis present

## 2015-03-29 DIAGNOSIS — M1990 Unspecified osteoarthritis, unspecified site: Secondary | ICD-10-CM | POA: Insufficient documentation

## 2015-03-29 DIAGNOSIS — Z86711 Personal history of pulmonary embolism: Secondary | ICD-10-CM | POA: Diagnosis not present

## 2015-03-29 DIAGNOSIS — E875 Hyperkalemia: Secondary | ICD-10-CM | POA: Diagnosis not present

## 2015-03-29 DIAGNOSIS — I130 Hypertensive heart and chronic kidney disease with heart failure and stage 1 through stage 4 chronic kidney disease, or unspecified chronic kidney disease: Secondary | ICD-10-CM | POA: Diagnosis not present

## 2015-03-29 DIAGNOSIS — R079 Chest pain, unspecified: Secondary | ICD-10-CM | POA: Diagnosis not present

## 2015-03-29 DIAGNOSIS — J45909 Unspecified asthma, uncomplicated: Secondary | ICD-10-CM | POA: Diagnosis not present

## 2015-03-29 DIAGNOSIS — M810 Age-related osteoporosis without current pathological fracture: Secondary | ICD-10-CM | POA: Diagnosis present

## 2015-03-29 DIAGNOSIS — M542 Cervicalgia: Secondary | ICD-10-CM | POA: Diagnosis not present

## 2015-03-29 LAB — COMPREHENSIVE METABOLIC PANEL
ALT: 22 U/L (ref 17–63)
AST: 52 U/L — AB (ref 15–41)
Albumin: 3.5 g/dL (ref 3.5–5.0)
Alkaline Phosphatase: 59 U/L (ref 38–126)
Anion gap: 6 (ref 5–15)
BILIRUBIN TOTAL: 1.1 mg/dL (ref 0.3–1.2)
BUN: 31 mg/dL — ABNORMAL HIGH (ref 6–20)
CHLORIDE: 105 mmol/L (ref 101–111)
CO2: 28 mmol/L (ref 22–32)
Calcium: 9.3 mg/dL (ref 8.9–10.3)
Creatinine, Ser: 1.69 mg/dL — ABNORMAL HIGH (ref 0.61–1.24)
GFR calc Af Amer: 42 mL/min — ABNORMAL LOW (ref >60)
GFR calc non Af Amer: 36 mL/min — ABNORMAL LOW (ref >60)
GLUCOSE: 111 mg/dL — AB (ref 65–99)
Potassium: 5.5 mmol/L — ABNORMAL HIGH (ref 3.5–5.1)
Sodium: 139 mmol/L (ref 135–145)
Total Protein: 6 g/dL — ABNORMAL LOW (ref 6.5–8.1)

## 2015-03-29 LAB — CBC WITH DIFFERENTIAL/PLATELET
BASOS ABS: 0 10*3/uL (ref 0.0–0.1)
BASOS PCT: 0 % (ref 0–1)
EOS ABS: 0.1 10*3/uL (ref 0.0–0.7)
EOS PCT: 2 % (ref 0–5)
HEMATOCRIT: 37.2 % — AB (ref 39.0–52.0)
Hemoglobin: 12.7 g/dL — ABNORMAL LOW (ref 13.0–17.0)
Lymphocytes Relative: 29 % (ref 12–46)
Lymphs Abs: 1.8 10*3/uL (ref 0.7–4.0)
MCH: 36.5 pg — ABNORMAL HIGH (ref 26.0–34.0)
MCHC: 34.1 g/dL (ref 30.0–36.0)
MCV: 106.9 fL — AB (ref 78.0–100.0)
MONO ABS: 0.6 10*3/uL (ref 0.1–1.0)
Monocytes Relative: 10 % (ref 3–12)
Neutro Abs: 3.6 10*3/uL (ref 1.7–7.7)
Neutrophils Relative %: 59 % (ref 43–77)
PLATELETS: 146 10*3/uL — AB (ref 150–400)
RBC: 3.48 MIL/uL — ABNORMAL LOW (ref 4.22–5.81)
RDW: 14.1 % (ref 11.5–15.5)
WBC: 6.1 10*3/uL (ref 4.0–10.5)

## 2015-03-29 LAB — I-STAT TROPONIN, ED: TROPONIN I, POC: 0 ng/mL (ref 0.00–0.08)

## 2015-03-29 LAB — PROTIME-INR
INR: 1.12 (ref 0.00–1.49)
Prothrombin Time: 14.6 seconds (ref 11.6–15.2)

## 2015-03-29 LAB — D-DIMER, QUANTITATIVE: D-Dimer, Quant: 0.41 ug/mL-FEU (ref 0.00–0.48)

## 2015-03-29 MED ORDER — DOCUSATE SODIUM 100 MG PO CAPS
100.0000 mg | ORAL_CAPSULE | Freq: Every day | ORAL | Status: DC
  Administered 2015-03-30: 100 mg via ORAL
  Filled 2015-03-29: qty 1

## 2015-03-29 MED ORDER — ACETAMINOPHEN 325 MG PO TABS
650.0000 mg | ORAL_TABLET | ORAL | Status: DC | PRN
Start: 2015-03-29 — End: 2015-03-30
  Administered 2015-03-29: 650 mg via ORAL
  Filled 2015-03-29: qty 2

## 2015-03-29 MED ORDER — GABAPENTIN 100 MG PO CAPS
200.0000 mg | ORAL_CAPSULE | Freq: Every day | ORAL | Status: DC
  Administered 2015-03-30: 200 mg via ORAL
  Filled 2015-03-29 (×2): qty 2

## 2015-03-29 MED ORDER — GABAPENTIN 100 MG PO CAPS
100.0000 mg | ORAL_CAPSULE | Freq: Every day | ORAL | Status: DC
  Administered 2015-03-30: 100 mg via ORAL
  Filled 2015-03-29 (×2): qty 1

## 2015-03-29 MED ORDER — VENLAFAXINE HCL 75 MG PO TABS: 75.0000 mg | ORAL_TABLET | Freq: Every day | ORAL | Status: DC

## 2015-03-29 MED ORDER — FENOFIBRATE 160 MG PO TABS
160.0000 mg | ORAL_TABLET | Freq: Every day | ORAL | Status: DC
  Administered 2015-03-30: 160 mg via ORAL
  Filled 2015-03-29: qty 1

## 2015-03-29 MED ORDER — MORPHINE SULFATE 2 MG/ML IJ SOLN: 2.0000 mg | INTRAMUSCULAR | Status: DC | PRN

## 2015-03-29 MED ORDER — ONDANSETRON HCL 4 MG/2ML IJ SOLN: 4.0000 mg | Freq: Four times a day (QID) | INTRAMUSCULAR | Status: DC | PRN

## 2015-03-29 MED ORDER — PSYLLIUM 95 % PO PACK
1.0000 | PACK | Freq: Every day | ORAL | Status: DC
  Filled 2015-03-29: qty 1

## 2015-03-29 MED ORDER — DONEPEZIL HCL 10 MG PO TABS
10.0000 mg | ORAL_TABLET | Freq: Every day | ORAL | Status: DC
  Administered 2015-03-30: 10 mg via ORAL
  Filled 2015-03-29 (×2): qty 1

## 2015-03-29 MED ORDER — OXYBUTYNIN CHLORIDE 5 MG PO TABS
5.0000 mg | ORAL_TABLET | Freq: Two times a day (BID) | ORAL | Status: DC
  Filled 2015-03-29: qty 1

## 2015-03-29 MED ORDER — GABAPENTIN 100 MG PO CAPS: 100.0000 mg | ORAL_CAPSULE | ORAL | Status: DC

## 2015-03-29 MED ORDER — LORATADINE 10 MG PO TABS
10.0000 mg | ORAL_TABLET | Freq: Every day | ORAL | Status: DC
  Administered 2015-03-30: 10 mg via ORAL
  Filled 2015-03-29: qty 1

## 2015-03-29 MED ORDER — VENLAFAXINE HCL ER 75 MG PO CP24
75.0000 mg | ORAL_CAPSULE | Freq: Every day | ORAL | Status: DC
  Administered 2015-03-30: 75 mg via ORAL
  Filled 2015-03-29 (×3): qty 1

## 2015-03-29 MED ORDER — ENOXAPARIN SODIUM 40 MG/0.4ML ~~LOC~~ SOLN
40.0000 mg | Freq: Every day | SUBCUTANEOUS | Status: DC
  Administered 2015-03-30: 40 mg via SUBCUTANEOUS
  Filled 2015-03-29 (×2): qty 0.4

## 2015-03-29 MED ORDER — ALBUTEROL SULFATE (2.5 MG/3ML) 0.083% IN NEBU: 3.0000 mL | INHALATION_SOLUTION | Freq: Four times a day (QID) | RESPIRATORY_TRACT | Status: DC | PRN

## 2015-03-29 MED ORDER — SODIUM CHLORIDE 0.9 % IV SOLN
INTRAVENOUS | Status: AC
  Administered 2015-03-29: via INTRAVENOUS

## 2015-03-29 MED ORDER — ATORVASTATIN CALCIUM 20 MG PO TABS
20.0000 mg | ORAL_TABLET | Freq: Every day | ORAL | Status: DC
  Administered 2015-03-30: 20 mg via ORAL
  Filled 2015-03-29 (×2): qty 1

## 2015-03-29 MED ORDER — FENTANYL CITRATE (PF) 100 MCG/2ML IJ SOLN
50.0000 ug | Freq: Once | INTRAMUSCULAR | Status: AC
  Administered 2015-03-29: 50 ug via INTRAVENOUS
  Filled 2015-03-29: qty 2

## 2015-03-29 MED ORDER — PANTOPRAZOLE SODIUM 40 MG PO TBEC
40.0000 mg | DELAYED_RELEASE_TABLET | Freq: Every day | ORAL | Status: DC
  Administered 2015-03-30: 40 mg via ORAL
  Filled 2015-03-29: qty 1

## 2015-03-29 MED ORDER — ASPIRIN EC 81 MG PO TBEC
81.0000 mg | DELAYED_RELEASE_TABLET | Freq: Every day | ORAL | Status: DC
  Administered 2015-03-30: 81 mg via ORAL
  Filled 2015-03-29: qty 1

## 2015-03-29 MED ORDER — GI COCKTAIL ~~LOC~~
30.0000 mL | Freq: Four times a day (QID) | ORAL | Status: DC | PRN
  Administered 2015-03-30: 30 mL via ORAL
  Filled 2015-03-29 (×2): qty 30

## 2015-03-29 NOTE — ED Notes (Signed)
Pt presents via [facility] for c/o CP beginning 2 hours ago radiating to jaw.  Pt denies ever having this pain before, 324 ASA given en route by EMS.  Pt denies SOB/N/V/diaphoresis.  BP-108-50 P-66 R-14 O2-98%, NAD.  A x 4, 6/10 jaw pain, 4/10 chest pain on arrival.  Pt has early onset Alzheimers.

## 2015-03-29 NOTE — ED Provider Notes (Signed)
CSN: [id number]     Arrival date & time 03/29/15  1817 History   First MD Initiated Contact with Patient 03/29/15 1818     Chief Complaint  Patient presents with  . Chest Pain     (Consider location/radiation/quality/duration/timing/severity/associated sxs/prior Treatment) Patient is a 81 y.o. male presenting with chest pain. The history is provided by the patient.  Chest Pain Associated symptoms: headache   Associated symptoms: no abdominal pain, no back pain, no nausea, no numbness, no shortness of breath, not vomiting and no weakness    patient presents with chest pain. Is in his epigastric to mid chest and goes up to his jaw. It is dull. No shortness of breath. No cough. No fevers. No nausea vomiting. No previous stents. Had heart cath 16 years ago and a negative stress test 4 years ago. Had previous pulmonary embolism and is off anticoagulation now. Patient does not tolerate nitroglycerin. He has a dull headache. No swelling in his legs. No nausea vomiting. No diaphoresis. He ran errands today and had no difficulty with that physical activity.  Past Medical History  Diagnosis Date  . GERD (gastroesophageal reflux disease)     h/o PUD  . CAD (coronary artery disease)     cath 2000 30% single vessel, normal nuclear stress test 12/03/2010, no evidence ischemia  . CKD (chronic kidney disease) stage 3, GFR 30-59 ml/min     baseline Cr 1.7  . Depression   . Hx pulmonary embolism 08/1991    negative hypercoagulable panel 12/2014  . History of phlebitis   . Allergic rhinitis   . Urge incontinence   . Elevated PSA     previous-normalized (followed by Dr. Jeffie Pollock, rec no repeat unless urinary sxs)  . HLD (hyperlipidemia)     hypertriglyceridemia  . Asthma     remote  . Arthritis   . Blood transfusion 1990's  . Schatzki's ring 02/2012    s/p dilation [name]  . History of fracture of right hip   . Hearing loss 06/2012    eval - rec annual exam  . Essential tremor 09/27/2008  .  Prediabetes   . DISH (diffuse idiopathic skeletal hyperostosis) 03/2013    lumbar spine on xray  . DDD (degenerative disc disease) 03/2013    by CT, diffuse multilevel cervical and lumbar spondylosis  . Osteoporosis 11/2013    DEXA T score -2.7  . Macrocytic anemia     stable B12/folate and periph smear 05/2013  . Sleep apnea     no CPAP- sleep apnea "cleared up 15 years ago"  . Dizziness     multifactorial, s/p PT at [facility] 06/2014 with HEP  . History of pyelonephritis 05/2014    hospitalization with sepsis  . Closed C7 fracture 11/06/2014   Past Surgical History  Procedure Laterality Date  . Tonsillectomy  1965  . Hernia repair  1979    Left  . Cystoscopy  1992    for kidney stones  . Exploratory laparotomy  1996    with incidental appendectomy  . Appendectomy  1996  . Knee arthroscopy  03/24/02    Right (Dr. Mauri Pole)  . Orif femoral neck fracture w/ dhs  08/03/02    Right (Dr. Mauri Pole)  . Ct abd w & pelvis wo cm  07/2001    Scarring of right lung, stable negative o/w  . Ct abd w & pelvis wo cm  11/2000    ? stones, right LL scarring  . V/q scan  06/1999  negative  . Colonoscopy  1998    [place]  . US echocardiography  12/28/2007    Mild aortic valve calcification EF 55%, basically nml  . Carotid u/s  12/28/2007    nml  . Eeg  11/12/2009    nml  . Mri  11/2009    Head, nml  . Cataract extraction  Jan, March 2011    bilateral  . Colonoscopy  07/2010    5 polyps, adenomatous, rec rpt 3 yrs  . Esophagogastroduodenoscopy  02/2012    dilation of schatzki's ring [name]  . Upper gastrointestinal endoscopy    . Colonoscopy  04/2014    3 polyps, adenomatous, f/u open ended given age [name]  . Cardiac catheterization  2000    30% one vessel   Family History  Problem Relation Age of Onset  . Stroke Mother   . Hypertension Mother   . Cancer Brother     prostate with mets  . Blindness Brother     legally  . Diabetes Brother   . Pulmonary embolism Sister     from  shoulder operation  . Alcohol abuse Brother   . Colon cancer Neg Hx   . Esophageal cancer Neg Hx   . Rectal cancer Neg Hx   . Stomach cancer Neg Hx    History  Substance Use Topics  . Smoking status: Never Smoker   . Smokeless tobacco: Never Used  . Alcohol Use: No    Review of Systems  Constitutional: Negative for activity change and appetite change.  Eyes: Negative for pain.  Respiratory: Negative for chest tightness and shortness of breath.   Cardiovascular: Positive for chest pain. Negative for leg swelling.  Gastrointestinal: Negative for nausea, vomiting, abdominal pain and diarrhea.  Genitourinary: Negative for flank pain.  Musculoskeletal: Positive for neck pain. Negative for back pain and neck stiffness.  Skin: Negative for rash.  Neurological: Positive for headaches. Negative for weakness and numbness.  Psychiatric/Behavioral: Negative for behavioral problems.      Allergies  Lactose intolerance (gi); Nizatidine; Penicillins; and Sulfadiazine  Home Medications   Prior to Admission medications   Medication Sig Start Date End Date Taking? Authorizing Provider  albuterol (PROVENTIL HFA;VENTOLIN HFA) 108 (90 BASE) MCG/ACT inhaler Inhale 2 puffs into the lungs every 6 (six) hours as needed for wheezing or shortness of breath.   Yes Historical Provider, MD  aspirin EC 81 MG tablet Take 81 mg by mouth daily.   Yes Historical Provider, MD  atorvastatin (LIPITOR) 40 MG tablet TAKE 1 TABLET AT BEDTIME Patient taking differently: TAKE 1/2 TABLET AT BEDTIME 02/26/15  Yes Ria Bush, MD  Cholecalciferol (VITAMIN D) 2000 UNITS CAPS Take 1 capsule by mouth daily.   Yes Historical Provider, MD  Coenzyme Q10 (COQ-10) 100 MG CAPS Take 1 capsule by mouth at bedtime.    Yes Historical Provider, MD  Cyanocobalamin (B-12 PO) Take 10,000 mcg by mouth every Monday, Wednesday, and Friday.    Yes Historical Provider, MD  DEXILANT 60 MG capsule TAKE 1 CAPSULE DAILY 02/12/15  Yes Milus Banister, MD  docusate sodium (COLACE) 100 MG capsule Take 100 mg by mouth daily.   Yes Historical Provider, MD  donepezil (ARICEPT) 10 MG tablet Take 1 tablet (10 mg total) by mouth at bedtime. 02/07/15  Yes Ria Bush, MD  fenofibrate (TRICOR) 145 MG tablet TAKE 1 TABLET DAILY 02/26/15  Yes Ria Bush, MD  fexofenadine (ALLEGRA) 180 MG tablet Take 180 mg by mouth every evening.  Yes Historical Provider, MD  gabapentin (NEURONTIN) 100 MG capsule Take 100 mg by mouth as directed. 200mg  in am and 100mg  in pm   Yes Historical Provider, MD  guaiFENesin (MUCINEX) 600 MG 12 hr tablet Take 1 tablet (600 mg total) by mouth 2 (two) times daily. Patient taking differently: Take 600 mg by mouth 2 (two) times daily as needed.  11/09/14  Yes Reyne Dumas, MD  Multiple Vitamins-Minerals (MULTIVITAMIN PO) Take by mouth daily.   Yes Historical Provider, MD  oxybutynin (DITROPAN) 5 MG tablet Take 5 mg by mouth 2 (two) times daily.    Yes Historical Provider, MD  psyllium (METAMUCIL SMOOTH TEXTURE) 28 % packet Take 1 packet by mouth daily.   Yes Historical Provider, MD  venlafaxine (EFFEXOR) 75 MG tablet Take 75 mg by mouth daily.   Yes Historical Provider, MD   BP 121/57 mmHg  Pulse 64  Temp(Src) 98 F (36.7 C) (Oral)  Resp 17  Ht 5\' 6"  (1.676 m)  Wt 165 lb (74.844 kg)  BMI 26.64 kg/m2  SpO2 99% Physical Exam  Constitutional: He is oriented to person, place, and time. He appears well-developed and well-nourished.  HENT:  Head: Normocephalic and atraumatic.  Eyes: Pupils are equal, round, and reactive to light.  Neck: Normal range of motion. Neck supple.  Cardiovascular: Normal rate, regular rhythm and normal heart sounds.   No murmur heard. Pulmonary/Chest: Effort normal and breath sounds normal. He exhibits no tenderness.  Abdominal: Soft. Bowel sounds are normal. He exhibits no distension. There is no tenderness.  Musculoskeletal: Normal range of motion. He exhibits no edema.  Neurological:  He is alert and oriented to person, place, and time. No cranial nerve deficit.  Skin: Skin is warm and dry.  Psychiatric: He has a normal mood and affect.  Nursing note and vitals reviewed.   ED Course  Procedures (including critical care time) Labs Review Labs Reviewed  CBC WITH DIFFERENTIAL/PLATELET - Abnormal; Notable for the following:    RBC 3.48 (*)    Hemoglobin 12.7 (*)    HCT 37.2 (*)    MCV 106.9 (*)    MCH 36.5 (*)    Platelets 146 (*)    All other components within normal limits  COMPREHENSIVE METABOLIC PANEL - Abnormal; Notable for the following:    Potassium 5.5 (*)    Glucose, Bld 111 (*)    BUN 31 (*)    Creatinine, Ser 1.69 (*)    Total Protein 6.0 (*)    AST 52 (*)    GFR calc non Af Amer 36 (*)    GFR calc Af Amer 42 (*)    All other components within normal limits  PROTIME-INR  D-DIMER, QUANTITATIVE (NOT AT [facility])  I-STAT TROPOININ, ED    Imaging Review Dg Chest 2 View  03/29/2015   CLINICAL DATA:  Mid chest in jaw pain starting today, weakness, history coronary artery disease, pulmonary embolism, asthma, hyperlipidemia, GERD  EXAM: CHEST  2 VIEW  COMPARISON:  11/07/2014  FINDINGS: Mild enlargement of cardiac silhouette.  Coronary arterial calcification noted.  Mediastinal contours and pulmonary vascularity normal.  Calcified RIGHT hilar adenopathy.  Lungs grossly clear.  No acute infiltrate, pleural effusion or pneumothorax.  Bones demineralized.  IMPRESSION: Old granulomatous disease.  No definite acute abnormalities.   Electronically Signed   By: Lavonia Dana M.D.   On: 03/29/2015 19:41     EKG Interpretation   Date/Time:  Thursday March 29 2015 18:28:47 EDT Ventricular Rate:  65 PR Interval:  248 QRS Duration: 94 QT Interval:  407 QTC Calculation: 423 R Axis:   -46 Text Interpretation:  Sinus or ectopic atrial rhythm Prolonged PR interval  LAD, consider left anterior fascicular block Low voltage, precordial leads  Abnormal R-wave progression,  early transition No significant change since  last tracing Confirmed by Alvino Chapel  MD, Ovid Curd [phone]) on 03/29/2015  6:34:26 PM      MDM   Final diagnoses:  Chest pain, unspecified chest pain type    Patient with chest pain. EKG and enzymes reassuring. His chest pain that goes to his jaw though and patient will be admitted to internal medicine. D-dimer done due to previous pulmonary embolism.    Davonna Belling, MD 03/29/15 [phone]

## 2015-03-29 NOTE — H&P (Signed)
[facility] History and Physical  [name] EXB:[id number] DOB: 03-Jan-1931 DOA: 03/29/2015  Referring physician: ED physician PCP: Ria Bush, MD   Chief Complaint: substernal chest pain  HPI:  [name] is an 81yo man with PMH of CAD, h/o PE (not on coumadin), CKD stage 3, essential tremor, HLD, GERD, asthma, DDD who presents with substernal chest pain.  He reports these symptoms started at 4pm today, were substernal, pressure like and radiated to the jaw.  The jaw pain was sharp.  The pain has been continuous and started to improve with aspirin and resolved with fentanyl injection.  He was at rest in a chair when it happened, but had been active earlier in the day.  He denied any SOB, nausea or diaphoresis.  He has recently started aricept and recently got a cortisone injection for a rash from poison ivy.  Further symptoms include mild LE swelling X 2 months.  He has never had pain like this before.  He had a LHC some years ago which showed mild CAD.    In the ED, he had a CXR which showed old granulomatous disease.  His first troponin was normal.  He had an EKG showing prolonged PR, LAD and possible anterior fascicular block.  He had a NM test in 2012.   Assessment and Plan:  Chest pain at rest with history of Coronary atherosclerosis - Will admit for ACS rule out, he has a heart score of 6, making some sort of imaging warranted - Initial troponin was okay, cycle 2 more times - AM EKG - Morphine for further chest pain (apparently he had a very severe low BP episode with nitro, should avoid) - Cardiology consult in the AM for Nuc med study - NPO at 4am in anticipation of above - Telemetry overnight - Received aspirin in the ED    AKI on CKD, hyperkalemia - Cr up from 1.12 to 1.69 with unclear cause, he reports no dysuria or other symptoms, no decreased PO intake and no decreased urination.  He does have urinary incontinence which causes him some issues.   - Fluids  with NS at 75cc/hr overnight for 8 hours - BMET in the AM - BUN/Cr ratio 18, not indicative of dehydration - Hold ditropan - Consider renal ultrasound in the ED for retention - Hyperkalemia is mild, no EKG changes, monitor after fluids overnight with repeat labs at 2am.  Holding kayexalate given concern for side effects.  If K is rising, would give insulin, calcium and glucose.    Grade 1 dCHF, stable - Per TTE in February of this year - Monitor respiratory status while on fluids and with AKI - Continue statin, tricor    Essential tremor - Continue home therapy    HLD (hyperlipidemia) - Continue home statin    GERD (gastroesophageal reflux disease) - Protonix while inpatient.   Radiological Exams on Admission: Dg Chest 2 View  03/29/2015   CLINICAL DATA:  Mid chest in jaw pain starting today, weakness, history coronary artery disease, pulmonary embolism, asthma, hyperlipidemia, GERD  EXAM: CHEST  2 VIEW  COMPARISON:  11/07/2014  FINDINGS: Mild enlargement of cardiac silhouette.  Coronary arterial calcification noted.  Mediastinal contours and pulmonary vascularity normal.  Calcified RIGHT hilar adenopathy.  Lungs grossly clear.  No acute infiltrate, pleural effusion or pneumothorax.  Bones demineralized.  IMPRESSION: Old granulomatous disease.  No definite acute abnormalities.   Electronically Signed   By: Lavonia Dana M.D.   On: 03/29/2015 19:41  Code Status: Full Family Communication: Pt and wife at bedside Disposition Plan: Admit for further evaluation    Gilles Chiquito, MD [phone]   Review of Systems:  Constitutional: Negative for fever, chills and malaise/fatigue. Negative for diaphoresis.  HENT: Negative for new hearing loss, ear pain Eyes: Negative for blurred vision, double vision Respiratory: Negative for cough, sputum production, shortness of breath Cardiovascular: + for chest pain, leg swelling Negative for palpitations, orthopnea, claudication  Gastrointestinal:  Negative for nausea, vomiting and abdominal pain Genitourinary: Negative for dysuria, urgency, frequency, flank pain.  Musculoskeletal: Negative for myalgias, back pain Skin: + for healing rash from poison ivy Neurological: Negative for dizziness.  He reports weakness on the right side "all his life" Endo/Heme/Allergies: + for environmental allergies and polydipsia. Does not bruise/bleed easily.     Past Medical History  Diagnosis Date  . GERD (gastroesophageal reflux disease)     h/o PUD  . CAD (coronary artery disease)     cath 2000 30% single vessel, normal nuclear stress test 12/03/2010, no evidence ischemia  . CKD (chronic kidney disease) stage 3, GFR 30-59 ml/min     baseline Cr 1.7  . Depression   . Hx pulmonary embolism 08/1991    negative hypercoagulable panel 12/2014  . History of phlebitis   . Allergic rhinitis   . Urge incontinence   . Elevated PSA     previous-normalized (followed by Dr. Jeffie Pollock, rec no repeat unless urinary sxs)  . HLD (hyperlipidemia)     hypertriglyceridemia  . Asthma     remote  . Arthritis   . Blood transfusion 1990's  . Schatzki's ring 02/2012    s/p dilation [name]  . History of fracture of right hip   . Hearing loss 06/2012    eval - rec annual exam  . Essential tremor 09/27/2008  . Prediabetes   . DISH (diffuse idiopathic skeletal hyperostosis) 03/2013    lumbar spine on xray  . DDD (degenerative disc disease) 03/2013    by CT, diffuse multilevel cervical and lumbar spondylosis  . Osteoporosis 11/2013    DEXA T score -2.7  . Macrocytic anemia     stable B12/folate and periph smear 05/2013  . Sleep apnea     no CPAP- sleep apnea "cleared up 15 years ago"  . Dizziness     multifactorial, s/p PT at [facility] 06/2014 with HEP  . History of pyelonephritis 05/2014    hospitalization with sepsis  . Closed C7 fracture 11/06/2014    Past Surgical History  Procedure Laterality Date  . Tonsillectomy  1965  . Hernia repair  1979    Left  .  Cystoscopy  1992    for kidney stones  . Exploratory laparotomy  1996    with incidental appendectomy  . Appendectomy  1996  . Knee arthroscopy  03/24/02    Right (Dr. Mauri Pole)  . Orif femoral neck fracture w/ dhs  08/03/02    Right (Dr. Mauri Pole)  . Ct abd w & pelvis wo cm  07/2001    Scarring of right lung, stable negative o/w  . Ct abd w & pelvis wo cm  11/2000    ? stones, right LL scarring  . V/q scan  06/1999    negative  . Colonoscopy  1998    [place]  . US echocardiography  12/28/2007    Mild aortic valve calcification EF 55%, basically nml  . Carotid u/s  12/28/2007    nml  . Eeg  11/12/2009  nml  . Mri  11/2009    Head, nml  . Cataract extraction  Jan, March 2011    bilateral  . Colonoscopy  07/2010    5 polyps, adenomatous, rec rpt 3 yrs  . Esophagogastroduodenoscopy  02/2012    dilation of schatzki's ring [name]  . Upper gastrointestinal endoscopy    . Colonoscopy  04/2014    3 polyps, adenomatous, f/u open ended given age [name]  . Cardiac catheterization  2000    30% one vessel    Social History:  reports that he has never smoked. He has never used smokeless tobacco. He reports that he does not drink alcohol or use illicit drugs.  Allergies  Allergen Reactions  . Lactose Intolerance (Gi)   . Nizatidine     REACTION: Rash (Axid)  . Penicillins     REACTION: Rash  . Sulfadiazine     REACTION: Hives    Family History  Problem Relation Age of Onset  . Stroke Mother   . Hypertension Mother   . Cancer Brother     prostate with mets  . Blindness Brother     legally  . Diabetes Brother   . Pulmonary embolism Sister     from shoulder operation  . Alcohol abuse Brother   . Colon cancer Neg Hx   . Esophageal cancer Neg Hx   . Rectal cancer Neg Hx   . Stomach cancer Neg Hx     Prior to Admission medications   Medication Sig Start Date End Date Taking? Authorizing Provider  albuterol (PROVENTIL HFA;VENTOLIN HFA) 108 (90 BASE) MCG/ACT inhaler  Inhale 2 puffs into the lungs every 6 (six) hours as needed for wheezing or shortness of breath.   Yes Historical Provider, MD  aspirin EC 81 MG tablet Take 81 mg by mouth daily.   Yes Historical Provider, MD  atorvastatin (LIPITOR) 40 MG tablet TAKE 1 TABLET AT BEDTIME Patient taking differently: TAKE 1/2 TABLET AT BEDTIME 02/26/15  Yes Ria Bush, MD  Cholecalciferol (VITAMIN D) 2000 UNITS CAPS Take 1 capsule by mouth daily.   Yes Historical Provider, MD  Coenzyme Q10 (COQ-10) 100 MG CAPS Take 1 capsule by mouth at bedtime.    Yes Historical Provider, MD  Cyanocobalamin (B-12 PO) Take 10,000 mcg by mouth every Monday, Wednesday, and Friday.    Yes Historical Provider, MD  DEXILANT 60 MG capsule TAKE 1 CAPSULE DAILY 02/12/15  Yes Milus Banister, MD  docusate sodium (COLACE) 100 MG capsule Take 100 mg by mouth daily.   Yes Historical Provider, MD  donepezil (ARICEPT) 10 MG tablet Take 1 tablet (10 mg total) by mouth at bedtime. 02/07/15  Yes Ria Bush, MD  fenofibrate (TRICOR) 145 MG tablet TAKE 1 TABLET DAILY 02/26/15  Yes Ria Bush, MD  fexofenadine (ALLEGRA) 180 MG tablet Take 180 mg by mouth every evening.    Yes Historical Provider, MD  gabapentin (NEURONTIN) 100 MG capsule Take 100 mg by mouth as directed. 200mg  in am and 100mg  in pm   Yes Historical Provider, MD  guaiFENesin (MUCINEX) 600 MG 12 hr tablet Take 1 tablet (600 mg total) by mouth 2 (two) times daily. Patient taking differently: Take 600 mg by mouth 2 (two) times daily as needed.  11/09/14  Yes Reyne Dumas, MD  Multiple Vitamins-Minerals (MULTIVITAMIN PO) Take by mouth daily.   Yes Historical Provider, MD  oxybutynin (DITROPAN) 5 MG tablet Take 5 mg by mouth 2 (two) times daily.    Yes Historical  Provider, MD  psyllium (METAMUCIL SMOOTH TEXTURE) 28 % packet Take 1 packet by mouth daily.   Yes Historical Provider, MD  venlafaxine (EFFEXOR) 75 MG tablet Take 75 mg by mouth daily.   Yes Historical Provider, MD     Physical Exam: Filed Vitals:   03/29/15 2015 03/29/15 2030 03/29/15 2100 03/29/15 2130  BP: 111/49 118/47 121/71 121/57  Pulse: 61 63 61 64  Temp:      TempSrc:      Resp: 13 25 15 17   Height:      Weight:      SpO2: 99% 100% 99% 99%    Physical Exam  Constitutional: Appears well-developed and well-nourished. No distress.  HENT: Normocephalic. Oropharynx is clear and moist.  Eyes: Conjunctivae and normal.  PERRLA, no scleral icterus.  Neck: Normal ROM. Neck supple. No JVD.  CVS: RR, NR, S1/S2 +, no murmurs, no gallops Pulmonary: Effort and breath sounds normal, no rhonchi, wheezes, rales.  Abdominal: Soft. BS +,  no distension, tenderness, rebound or guarding.  Musculoskeletal: trace edema to bilateral LE and no tenderness.  Neuro: Alert. Normal muscle tone and strength in upper extremities Skin: Skin is warm and dry. + rash from poison ivy, healing and covered on right leg. Not diaphoretic. No erythema. No pallor.  Psychiatric: Normal mood and affect. Behavior, judgment, thought content normal.   Labs on Admission:  Basic Metabolic Panel:  Recent Labs Lab 03/29/15 1914  NA 139  K 5.5*  CL 105  CO2 28  GLUCOSE 111*  BUN 31*  CREATININE 1.69*  CALCIUM 9.3   Liver Function Tests:  Recent Labs Lab 03/29/15 1914  AST 52*  ALT 22  ALKPHOS 59  BILITOT 1.1  PROT 6.0*  ALBUMIN 3.5   CBC:  Recent Labs Lab 03/29/15 1914  WBC 6.1  NEUTROABS 3.6  HGB 12.7*  HCT 37.2*  MCV 106.9*  PLT 146*   EKG: prolonged PR, LAD and possible anterior fascicular block.  No acute ST/TW changes currently.    If 7PM-7AM, please contact night-coverage [email or web address] Password [facility] 03/29/2015, 9:51 PM

## 2015-03-29 NOTE — ED Notes (Signed)
Belongings sent up to 3E10 with the wife.

## 2015-03-29 NOTE — ED Notes (Signed)
Patient transported to X-ray 

## 2015-03-29 NOTE — ED Notes (Signed)
MD at bedside. 

## 2015-03-29 NOTE — Progress Notes (Signed)
Patient admitted to 3E10 via stretcher.  A&O x4. VSS. Telemetry placed and CCMD notified. No further needs at this time. A.Sheriff Rodenberg, RN

## 2015-03-30 ENCOUNTER — Ambulatory Visit (HOSPITAL_COMMUNITY): Payer: Medicare Other

## 2015-03-30 ENCOUNTER — Encounter (HOSPITAL_COMMUNITY): Payer: Self-pay | Admitting: General Practice

## 2015-03-30 ENCOUNTER — Observation Stay (HOSPITAL_COMMUNITY): Payer: Medicare Other

## 2015-03-30 ENCOUNTER — Other Ambulatory Visit: Payer: Self-pay | Admitting: Family Medicine

## 2015-03-30 DIAGNOSIS — I2510 Atherosclerotic heart disease of native coronary artery without angina pectoris: Secondary | ICD-10-CM | POA: Diagnosis not present

## 2015-03-30 DIAGNOSIS — E785 Hyperlipidemia, unspecified: Secondary | ICD-10-CM | POA: Diagnosis not present

## 2015-03-30 DIAGNOSIS — R0789 Other chest pain: Secondary | ICD-10-CM | POA: Diagnosis not present

## 2015-03-30 DIAGNOSIS — N183 Chronic kidney disease, stage 3 (moderate): Secondary | ICD-10-CM | POA: Diagnosis not present

## 2015-03-30 DIAGNOSIS — K219 Gastro-esophageal reflux disease without esophagitis: Secondary | ICD-10-CM | POA: Diagnosis not present

## 2015-03-30 DIAGNOSIS — R079 Chest pain, unspecified: Secondary | ICD-10-CM | POA: Diagnosis not present

## 2015-03-30 LAB — BASIC METABOLIC PANEL
Anion gap: 7 (ref 5–15)
BUN: 29 mg/dL — AB (ref 6–20)
CO2: 24 mmol/L (ref 22–32)
Calcium: 9 mg/dL (ref 8.9–10.3)
Chloride: 105 mmol/L (ref 101–111)
Creatinine, Ser: 1.55 mg/dL — ABNORMAL HIGH (ref 0.61–1.24)
GFR, EST AFRICAN AMERICAN: 47 mL/min — AB (ref >60)
GFR, EST NON AFRICAN AMERICAN: 40 mL/min — AB (ref >60)
Glucose, Bld: 108 mg/dL — ABNORMAL HIGH (ref 65–99)
POTASSIUM: 4.5 mmol/L (ref 3.5–5.1)
Sodium: 136 mmol/L (ref 135–145)

## 2015-03-30 LAB — TROPONIN I
Troponin I: <0.03 ng/mL (ref <0.031)
Troponin I: <0.03 ng/mL (ref <0.031)

## 2015-03-30 MED ORDER — REGADENOSON 0.4 MG/5ML IV SOLN
0.4000 mg | Freq: Once | INTRAVENOUS | Status: AC
  Administered 2015-03-30: 0.4 mg via INTRAVENOUS
  Filled 2015-03-30: qty 5

## 2015-03-30 MED ORDER — TECHNETIUM TC 99M SESTAMIBI - CARDIOLITE
30.0000 | Freq: Once | INTRAVENOUS | Status: AC | PRN
  Administered 2015-03-30: 13:00:00 30 via INTRAVENOUS

## 2015-03-30 MED ORDER — TECHNETIUM TC 99M SESTAMIBI GENERIC - CARDIOLITE
10.0000 | Freq: Once | INTRAVENOUS | Status: AC | PRN
  Administered 2015-03-30: 10 via INTRAVENOUS

## 2015-03-30 NOTE — Progress Notes (Signed)
Patient had a complaint of mid sternal chest pain 6/10. EKG performed and GI cocktail given. Will continue to monitor.

## 2015-03-30 NOTE — Telephone Encounter (Signed)
Looks like patient is currently admitted. This should wait until discharge- they can decide if he needs to continue it.  Please notify pharmacy/patient.  Thanks.

## 2015-03-30 NOTE — Telephone Encounter (Signed)
Medication no longer on med list.  Okay to refill?

## 2015-03-30 NOTE — Consult Note (Signed)
CONSULT NOTE  Date: 03/30/2015               Patient Name:  [name] MRN: [id number]  DOB: [birth year] Age / Sex: 81 y.o., male        PCP: [name] Primary Cardiologist: [name]            Referring Physician: Daryll [name]              Reason for Consult: Chest pain            History of Present Illness: Patient is a 81 y.o. male with a PMHx of dementia, HTN, pulmonary embolus,  ? hypercoagulablity,   who was admitted to [name] on 03/29/2015 for evaluation of chest pain .Marland Kitchen   Pt woke up with CP last night . Mid sternal , generalized , radiated up to his jaw. It lasted about 30 minutes. It eased up some when EMS gave him 4 baby aspirin.  He's tried nitroglycerin in the past but became very hypotensive so now he doesn't take it. He's currently pain-free.  He has some significant cognitive disability. His wife helped him answer several questions.   Medications: Outpatient medications: Prescriptions prior to admission  Medication Sig Dispense Refill Last Dose  . albuterol (PROVENTIL HFA;VENTOLIN HFA) 108 (90 BASE) MCG/ACT inhaler Inhale 2 puffs into the lungs every 6 (six) hours as needed for wheezing or shortness of breath.   03/28/2015 at Unknown time  . aspirin EC 81 MG tablet Take 81 mg by mouth daily.   03/28/2015 at Unknown time  . atorvastatin (LIPITOR) 40 MG tablet TAKE 1 TABLET AT BEDTIME (Patient taking differently: TAKE 1/2 TABLET AT BEDTIME) 90 tablet 1 03/28/2015 at Unknown time  . Cholecalciferol (VITAMIN D) 2000 UNITS CAPS Take 1 capsule by mouth daily.   03/28/2015 at Unknown time  . Coenzyme Q10 (COQ-10) 100 MG CAPS Take 1 capsule by mouth at bedtime.    03/28/2015 at Unknown time  . Cyanocobalamin (B-12 PO) Take 10,000 mcg by mouth every Monday, Wednesday, and Friday.    03/28/2015 at Unknown time  . DEXILANT 60 MG capsule TAKE 1 CAPSULE DAILY 90 capsule 2 03/29/2015 at Unknown time  . docusate sodium (COLACE) 100 MG capsule Take 100 mg by mouth daily.    03/28/2015 at Unknown time  . donepezil (ARICEPT) 10 MG tablet Take 1 tablet (10 mg total) by mouth at bedtime. 30 tablet 3 03/28/2015 at Unknown time  . fenofibrate (TRICOR) 145 MG tablet TAKE 1 TABLET DAILY 90 tablet 1 03/28/2015 at Unknown time  . fexofenadine (ALLEGRA) 180 MG tablet Take 180 mg by mouth every evening.    03/29/2015 at Unknown time  . gabapentin (NEURONTIN) 100 MG capsule Take 100 mg by mouth as directed. 200mg  in am and 100mg  in pm   03/29/2015 at Unknown time  . guaiFENesin (MUCINEX) 600 MG 12 hr tablet Take 1 tablet (600 mg total) by mouth 2 (two) times daily. (Patient taking differently: Take 600 mg by mouth 2 (two) times daily as needed. ) 20 tablet 0 Past Month at Unknown time  . Multiple Vitamins-Minerals (MULTIVITAMIN PO) Take by mouth daily.   03/28/2015 at Unknown time  . oxybutynin (DITROPAN) 5 MG tablet Take 5 mg by mouth 2 (two) times daily.    03/29/2015 at Unknown time  . psyllium (METAMUCIL SMOOTH TEXTURE) 28 % packet Take 1 packet by mouth daily.   03/28/2015 at Unknown time  . venlafaxine (EFFEXOR) 75  MG tablet Take 75 mg by mouth daily.   03/29/2015 at Unknown time    Current medications: Current Facility-Administered Medications  Medication Dose Route Frequency Provider Last Rate Last Dose  . acetaminophen (TYLENOL) tablet 650 mg  650 mg Oral Q4H PRN [name] Falcon, MD   650 mg at 03/29/15 2357  . albuterol (PROVENTIL) (2.5 MG/3ML) 0.083% nebulizer solution 3 mL  3 mL Inhalation Q6H PRN [name] Falcon, MD      . aspirin EC tablet 81 mg  81 mg Oral Daily [name] Falcon, MD   81 mg at 03/30/15 0947  . atorvastatin (LIPITOR) tablet 20 mg  20 mg Oral QHS [name] Falcon, MD   20 mg at 03/30/15 0100  . docusate sodium (COLACE) capsule 100 mg  100 mg Oral Daily [name] Falcon, MD   100 mg at 03/30/15 0947  . donepezil (ARICEPT) tablet 10 mg  10 mg Oral QHS [name] Falcon, MD   10 mg at 03/30/15 0100  . enoxaparin (LOVENOX) injection 40 mg  40 mg Subcutaneous QHS [name] Falcon, MD   40 mg at 03/30/15 0104  . fenofibrate tablet 160 mg  160 mg Oral Daily [name] Falcon, MD   160 mg at 03/30/15 0947  . gabapentin (NEURONTIN) capsule 200 mg  200 mg Oral Daily [name] Falcon, MD       And  . gabapentin (NEURONTIN) capsule 100 mg  100 mg Oral QHS [name] Falcon, MD   100 mg at 03/30/15 0100  . gi cocktail (Maalox,Lidocaine,Donnatal)  30 mL Oral QID PRN [name] Falcon, MD   30 mL at 03/30/15 0659  . loratadine (CLARITIN) tablet 10 mg  10 mg Oral Daily [name] Falcon, MD   10 mg at 03/30/15 0947  . morphine 2 MG/ML injection 2 mg  2 mg Intravenous Q2H PRN [name] Falcon, MD      . ondansetron [facility]) injection 4 mg  4 mg Intravenous Q6H PRN [name] Falcon, MD      . pantoprazole (PROTONIX) EC tablet 40 mg  40 mg Oral Daily [name] Falcon, MD   40 mg at 03/30/15 0954  . psyllium (HYDROCIL/METAMUCIL) packet 1 packet  1 packet Oral Daily [name] Falcon, MD   Stopped at 03/30/15 1000  . venlafaxine XR (EFFEXOR-XR) 24 hr capsule 75 mg  75 mg Oral Q breakfast [name] Falcon, MD   75 mg at 03/30/15 0946     Allergies  Allergen Reactions  . Lactose Intolerance (Gi)   . Nizatidine     REACTION: Rash (Axid)  . Penicillins     REACTION: Rash  . Sulfadiazine     REACTION: Hives     Past Medical History  Diagnosis Date  . GERD (gastroesophageal reflux disease)     h/o PUD  . CAD (coronary artery disease)     cath 2000 30% single vessel, normal nuclear stress test 12/03/2010, no evidence ischemia  . CKD (chronic kidney disease) stage 3, GFR 30-59 ml/min     baseline Cr 1.7  . Depression   . Hx pulmonary embolism 08/1991    negative hypercoagulable panel 12/2014  . History of phlebitis   . Allergic rhinitis   . Urge incontinence   . Elevated PSA     previous-normalized (followed by Dr. Jeffie [name], rec no repeat unless urinary sxs)  . HLD (hyperlipidemia)     hypertriglyceridemia  . Asthma  remote  . Arthritis   . Blood transfusion 1990's  . Schatzki's ring  02/2012    s/p dilation [name])  . History of fracture of right hip   . Hearing loss 06/2012    eval - rec annual exam  . Essential tremor 09/27/2008  . Prediabetes   . DISH (diffuse idiopathic skeletal hyperostosis) 03/2013    lumbar spine on xray  . DDD (degenerative disc disease) 03/2013    by CT, diffuse multilevel cervical and lumbar spondylosis  . Osteoporosis 11/2013    DEXA T score -2.7  . Macrocytic anemia     stable B12/folate and periph smear 05/2013  . Sleep apnea     no CPAP- sleep apnea "cleared up 15 years ago"  . Dizziness     multifactorial, s/p PT at [facility] 06/2014 with HEP  . History of pyelonephritis 05/2014    hospitalization with sepsis  . Closed C7 fracture 11/06/2014    Past Surgical History  Procedure Laterality Date  . Tonsillectomy  1965  . Hernia repair  1979    Left  . Cystoscopy  1992    for kidney stones  . Exploratory laparotomy  1996    with incidental appendectomy  . Appendectomy  1996  . Knee arthroscopy  03/24/02    Right (Dr. Mauri Pole)  . Orif femoral neck fracture w/ dhs  08/03/02    Right (Dr. Mauri Pole)  . Ct abd w & pelvis wo cm  07/2001    Scarring of right lung, stable negative o/w  . Ct abd w & pelvis wo cm  11/2000    ? stones, right LL scarring  . V/q scan  06/1999    negative  . [place]oscopy  1998    [place]  . US echocardiography  12/28/2007    Mild aortic valve calcification EF 55%, basically nml  . Carotid u/s  12/28/2007    nml  . Eeg  11/12/2009    nml  . Mri  11/2009    Head, nml  . Cataract extraction  Jan, March 2011    bilateral  . Colonoscopy  07/2010    5 polyps, adenomatous, rec rpt 3 yrs  . Esophagogastroduodenoscopy  02/2012    dilation of schatzki's ring [name])  . Upper gastrointestinal endoscopy    . Colonoscopy  04/2014    3 polyps, adenomatous, f/u open ended given age [name])  . Cardiac catheterization  2000    30% one vessel    Family History  Problem Relation Age of Onset  . Stroke Mother   .  Hypertension Mother   . Cancer Brother     prostate with mets  . Blindness Brother     legally  . Diabetes Brother   . Pulmonary embolism Sister     from shoulder operation  . Alcohol abuse Brother   . Colon cancer Neg Hx   . Esophageal cancer Neg Hx   . Rectal cancer Neg Hx   . Stomach cancer Neg Hx     Social History:  reports that he has never smoked. He has never used smokeless tobacco. He reports that he does not drink alcohol or use illicit drugs.   Review of Systems: Constitutional:  denies fever, chills, diaphoresis, appetite change and fatigue.  HEENT: denies photophobia, eye pain, redness, hearing loss, ear pain, congestion, sore throat, rhinorrhea, sneezing, neck pain, neck stiffness and tinnitus.  Respiratory: denies SOB, DOE, cough, chest tightness, and wheezing.  Cardiovascular: admits to chest pain,  and some mild  leg swelling.  Gastrointestinal: denies nausea, vomiting, abdominal pain, diarrhea, constipation, blood in stool.  Genitourinary: denies dysuria, urgency, frequency, hematuria, flank pain and difficulty urinating.  Musculoskeletal: denies  myalgias, back pain, joint swelling, arthralgias and gait problem.   Skin: denies pallor, rash and wound.  Neurological: denies dizziness, seizures, syncope, weakness, light-headedness, numbness and headaches.   Hematological: denies adenopathy, easy bruising, personal or family bleeding history.  Psychiatric/ Behavioral: denies suicidal ideation, mood changes, confusion, nervousness, sleep disturbance and agitation.    Physical Exam: BP 128/44 mmHg  Pulse 54  Temp(Src) 97.5 F (36.4 C) (Oral)  Resp 19  Ht 5\' 6"  (1.676 m)  Wt 73.573 kg (162 lb 3.2 oz)  BMI 26.19 kg/m2  SpO2 100%  Wt Readings from Last 3 Encounters:  03/30/15 73.573 kg (162 lb 3.2 oz)  03/26/15 75.297 kg (166 lb)  02/07/15 74.617 kg (164 lb 8 oz)    General: Vital signs reviewed and noted. Well-developed, well-nourished, in no acute  distress; alert,   Head: Normocephalic, atraumatic, sclera anicteric,   Neck: Supple. Negative for carotid bruits. No JVD   Lungs:  Clear bilaterally, no  wheezes, rales, or rhonchi. Breathing is normal   Heart: RRR with S1 S2. No murmurs, rubs, or gallops   Abdomen/ GI :  Soft, non-tender, non-distended with normoactive bowel sounds. No hepatomegaly. No rebound/guarding. No obvious abdominal masses   MSK: Strength and the appear normal for age.   Extremities: No clubbing or cyanosis. No edema by me exam ,   Distal pedal pulses are 2+ and equal   Neurologic:  CN are grossly intact,  No obvious motor or sensory defect.  Alert and oriented X 3. Moves all extremities spontaneously.  Psych: Responds to questions appropriately with a normal affect.     Lab results: Basic Metabolic Panel:  Recent Labs Lab 03/29/15 1914 03/30/15 0245  NA 139 136  K 5.5* 4.5  CL 105 105  CO2 28 24  GLUCOSE 111* 108*  BUN 31* 29*  CREATININE 1.69* 1.55*  CALCIUM 9.3 9.0    Liver Function Tests:  Recent Labs Lab 03/29/15 1914  AST 52*  ALT 22  ALKPHOS 59  BILITOT 1.1  PROT 6.0*  ALBUMIN 3.5   No results for input(s): LIPASE, AMYLASE in the last 168 hours. No results for input(s): AMMONIA in the last 168 hours.  CBC:  Recent Labs Lab 03/29/15 1914  WBC 6.1  NEUTROABS 3.6  HGB 12.7*  HCT 37.2*  MCV 106.9*  PLT 146*    Cardiac Enzymes:  Recent Labs Lab 03/29/15 2213 03/30/15 0245 03/30/15 0421  TROPONINI <0.03 <0.03 <0.03    BNP: Invalid input(s): POCBNP  CBG: No results for input(s): GLUCAP in the last 168 hours.  Coagulation Studies:  Recent Labs  03/29/15 1914  LABPROT 14.6  INR 1.12     Other results: Personal review of EKG shows :  Sinus bradycardia 55 beats a minute. He has no ST or T wave changes.  Imaging: Dg Chest 2 View  03/29/2015   CLINICAL DATA:  Mid chest in jaw pain starting today, weakness, history coronary artery disease, pulmonary embolism,  asthma, hyperlipidemia, GERD  EXAM: CHEST  2 VIEW  COMPARISON:  11/07/2014  FINDINGS: Mild enlargement of cardiac silhouette.  Coronary arterial calcification noted.  Mediastinal contours and pulmonary vascularity normal.  Calcified RIGHT hilar adenopathy.  Lungs grossly clear.  No acute infiltrate, pleural effusion or pneumothorax.  Bones demineralized.  IMPRESSION: Old  granulomatous disease.  No definite acute abnormalities.   Electronically Signed   By: Lavonia Dana M.D.   On: 03/29/2015 19:41       Assessment & Plan:  1.  Chest discomfort: Patient presents with 30 minutes of chest discomfort. The pain was midsternal and radiated up to his jaw. It was not pleuritic. I think our best option is to proceed with a [place] study.  His d-dimer was negative. His symptoms are not consistent with a pulmonary embolus.  He should be able to be discharged to home if the stress Myoview study is normal.  2. History pulmonary embolus  The patient has a history of pulmonary embolus but over the years has become somewhat demented and has excellent fallen several times. At this time we don't have any evidence that this is related to a DVT or pulmonary embolus. We'll continue to follow him closely.   Thayer Headings, Brooke Bonito., MD, [facility] 03/30/2015, 10:01 AM Office - [phone] Pager [phone]

## 2015-03-30 NOTE — Progress Notes (Signed)
Pt's lexiscan myoview per radiology with EF 76% , normal wall motion and inf. Lat. Attenuation with rest alone , no ischemia.  I notified patient.   Attending Note:   The patient was seen and examined.  Agree with assessment and plan as noted above.  Changes made to the above note as needed.  Pt is stable for DC    Thayer Headings, Brooke Bonito., MD, [facility] 03/30/2015, 3:26 PM [address],  [place] Pager [phone]

## 2015-03-30 NOTE — Discharge Summary (Signed)
Physician Discharge Summary  [name] IRS:[id number] DOB: June 17, 1931 DOA: 03/29/2015  PCP: Ria Bush, MD  Admit date: 03/29/2015 Discharge date: 03/30/2015  Time spent: 35 minutes  Recommendations for Outpatient Follow-up:  1. Patient admitted for chest pain having a negative stress test. Could be related to gastroesophageal reflux disease, please follow-up on symptoms. 2. Please follow-up on a BMP on hospital follow-up. He is not have acute kidney injury initially presenting with a creatinine of 1.69, trending down to 1.55 on day of discharge.   Discharge Diagnoses:  Principal Problem:   Chest pain at rest Active Problems:   Coronary atherosclerosis   CKD (chronic kidney disease) stage 3, GFR 30-59 ml/min   Essential tremor   INCONTINENCE, URGE   HLD (hyperlipidemia)   GERD (gastroesophageal reflux disease)   Osteoporosis   Discharge Condition: Stable  Diet recommendation: Heart healthy  Filed Weights   03/29/15 1832 03/29/15 2236 03/30/15 0511  Weight: 74.844 kg (165 lb) 73.211 kg (161 lb 6.4 oz) 73.573 kg (162 lb 3.2 oz)    History of present illness:  [name] is an 81yo man with PMH of CAD, h/o PE (not on coumadin), CKD stage 3, essential tremor, HLD, GERD, asthma, DDD who presents with substernal chest pain. He reports these symptoms started at 4pm today, were substernal, pressure like and radiated to the jaw. The jaw pain was sharp. The pain has been continuous and started to improve with aspirin and resolved with fentanyl injection. He was at rest in a chair when it happened, but had been active earlier in the day. He denied any SOB, nausea or diaphoresis. He has recently started aricept and recently got a cortisone injection for a rash from poison ivy. Further symptoms include mild LE swelling X 2 months. He has never had pain like this before. He had a LHC some years ago which showed mild CAD.   In the ED, he had a CXR which showed old  granulomatous disease. His first troponin was normal. He had an EKG showing prolonged PR, LAD and possible anterior fascicular block. He had a NM test in 2012.   Hospital Course:  Patient is an 81 year old with a past medical history of nonobstructive coronary artery disease, history of pulmonary embolism not on chronic anticoagulation, Stage II  CKD who was admitted to medicine service on 03/29/2015 when he present with complaints of chest pain. He reported having chest pain across his chest, that was characterized as pressure/heavy, lasted for approximately 30 minutes and radiated to his jaw. He denied shortness of breath, nausea, vomiting, diaphoresis. EKG shows sinus rhythm, troponins were cycled and remained negative. On the following morning cardiology was consulted as he underwent stress test on 03/30/2015. This study revealed an ejection fraction of 76% without evidence of reversible ischemia. I suspect chest pain may have been related to gastroesophageal reflux disease. He was discharged home on 03/30/2015.  Procedures:  Nuclear stress test performed on 03/30/2015; no evidence of reversible ischemia  Consultations:  Cardiology  Discharge Exam: Filed Vitals:   03/30/15 1420  BP: 126/47  Pulse: 65  Temp: 98.4 F (36.9 C)  Resp: 20    General: Patient is in no acute distress, he is awake alert oriented, reporting having episodic chest pain earlier this morning  Cardiovascular: Regular rate and rhythm normal S1-S2 no murmur rubs or gallops  Respiratory: Normal respiratory effort, lungs are clear to auscultation bilaterally  Abdomen: Soft nontender nondistended  Musculoskeletal: Has trace edema to lower extremities bilaterally  Discharge Instructions   Discharge Instructions    Call MD for:  difficulty breathing, headache or visual disturbances    Complete by:  As directed      Call MD for:  extreme fatigue    Complete by:  As directed      Call MD for:  hives     Complete by:  As directed      Call MD for:  persistant dizziness or light-headedness    Complete by:  As directed      Call MD for:  persistant nausea and vomiting    Complete by:  As directed      Call MD for:  redness, tenderness, or signs of infection (pain, swelling, redness, odor or green/yellow discharge around incision site)    Complete by:  As directed      Call MD for:  severe uncontrolled pain    Complete by:  As directed      Call MD for:  temperature >100.4    Complete by:  As directed      Call MD for:    Complete by:  As directed      Diet - low sodium heart healthy    Complete by:  As directed      Increase activity slowly    Complete by:  As directed           Current Discharge Medication List    CONTINUE these medications which have NOT CHANGED   Details  albuterol (PROVENTIL HFA;VENTOLIN HFA) 108 (90 BASE) MCG/ACT inhaler Inhale 2 puffs into the lungs every 6 (six) hours as needed for wheezing or shortness of breath.    aspirin EC 81 MG tablet Take 81 mg by mouth daily.    atorvastatin (LIPITOR) 40 MG tablet TAKE 1 TABLET AT BEDTIME Qty: 90 tablet, Refills: 1    Cholecalciferol (VITAMIN D) 2000 UNITS CAPS Take 1 capsule by mouth daily.    Coenzyme Q10 (COQ-10) 100 MG CAPS Take 1 capsule by mouth at bedtime.     Cyanocobalamin (B-12 PO) Take 10,000 mcg by mouth every Monday, Wednesday, and Friday.     DEXILANT 60 MG capsule TAKE 1 CAPSULE DAILY Qty: 90 capsule, Refills: 2    docusate sodium (COLACE) 100 MG capsule Take 100 mg by mouth daily.    donepezil (ARICEPT) 10 MG tablet Take 1 tablet (10 mg total) by mouth at bedtime. Qty: 30 tablet, Refills: 3    fenofibrate (TRICOR) 145 MG tablet TAKE 1 TABLET DAILY Qty: 90 tablet, Refills: 1    fexofenadine (ALLEGRA) 180 MG tablet Take 180 mg by mouth every evening.     gabapentin (NEURONTIN) 100 MG capsule Take 100 mg by mouth as directed. 200mg  in am and 100mg  in pm    guaiFENesin (MUCINEX) 600 MG 12  hr tablet Take 1 tablet (600 mg total) by mouth 2 (two) times daily. Qty: 20 tablet, Refills: 0    Multiple Vitamins-Minerals (MULTIVITAMIN PO) Take by mouth daily.    oxybutynin (DITROPAN) 5 MG tablet Take 5 mg by mouth 2 (two) times daily.     psyllium (METAMUCIL SMOOTH TEXTURE) 28 % packet Take 1 packet by mouth daily.    venlafaxine (EFFEXOR) 75 MG tablet Take 75 mg by mouth daily.       Allergies  Allergen Reactions  . Lactose Intolerance (Gi)   . Nizatidine     REACTION: Rash (Axid)  . Penicillins     REACTION: Rash  . Sulfadiazine  REACTION: Hives   Follow-up Information    Follow up with Ria Bush, MD In 2 weeks.   Specialty:  Family Medicine   Contact information:   [place] [address] [phone]        The results of significant diagnostics from this hospitalization (including imaging, microbiology, ancillary and laboratory) are listed below for reference.    Significant Diagnostic Studies: Dg Chest 2 View  03/29/2015   CLINICAL DATA:  Mid chest in jaw pain starting today, weakness, history coronary artery disease, pulmonary embolism, asthma, hyperlipidemia, GERD  EXAM: CHEST  2 VIEW  COMPARISON:  11/07/2014  FINDINGS: Mild enlargement of cardiac silhouette.  Coronary arterial calcification noted.  Mediastinal contours and pulmonary vascularity normal.  Calcified RIGHT hilar adenopathy.  Lungs grossly clear.  No acute infiltrate, pleural effusion or pneumothorax.  Bones demineralized.  IMPRESSION: Old granulomatous disease.  No definite acute abnormalities.   Electronically Signed   By: Lavonia Dana M.D.   On: 03/29/2015 19:41    Microbiology: No results found for this or any previous visit (from the past 240 hour(s)).   Labs: Basic Metabolic Panel:  Recent Labs Lab 03/29/15 1914 03/30/15 0245  NA 139 136  K 5.5* 4.5  CL 105 105  CO2 28 24  GLUCOSE 111* 108*  BUN 31* 29*  CREATININE 1.69* 1.55*  CALCIUM 9.3 9.0    Liver Function Tests:  Recent Labs Lab 03/29/15 1914  AST 52*  ALT 22  ALKPHOS 59  BILITOT 1.1  PROT 6.0*  ALBUMIN 3.5   No results for input(s): LIPASE, AMYLASE in the last 168 hours. No results for input(s): AMMONIA in the last 168 hours. CBC:  Recent Labs Lab 03/29/15 1914  WBC 6.1  NEUTROABS 3.6  HGB 12.7*  HCT 37.2*  MCV 106.9*  PLT 146*   Cardiac Enzymes:  Recent Labs Lab 03/29/15 2213 03/30/15 0245 03/30/15 0421  TROPONINI <0.03 <0.03 <0.03   BNP: BNP (last 3 results) No results for input(s): BNP in the last 8760 hours.  ProBNP (last 3 results)  Recent Labs  06/03/14 0650  PROBNP 6915.0*    CBG: No results for input(s): GLUCAP in the last 168 hours.     SignedKelvin Cellar  [facility] 03/30/2015, 4:11 PM

## 2015-03-30 NOTE — Progress Notes (Signed)
[facility] PROGRESS NOTE  [name] QAS:[id number] DOB: December 24, 1930 DOA: 03/29/2015 PCP: [name] Bush, MD  Assessment/Plan: 1. Chest pain -Patient is an 81 year old woman with a history of nondestructive coronary artery disease undergoing heart cath back in 2000 reporting he was found to have 30% stenosis of one of his coronaries at the time. He presents with chest pain having typical and atypical features. Has not undergone cardiac workup since 2000 -Troponins were cycled and remained negative 3 sets overnight. Lab work overnight included a d-dimer which was negative at 0.41 -Had episode of chest pain this morning -Cardiology was consulted as he is undergoing stress test  2.  Acute on chronic renal failure -On 12/15/2014 patient having a creatinine 1.12, elevated to 1.69 on initial lab work. He was given IV fluids overnight with his creatinine trended down to 1.55 -Will continue to monitor his kidney function.  3.  Hyperkalemia. -Lab showing a potassium of 5.5, coming down to 4.5 I suspect secondary to acute on chronic renal failure  4.  Dyslipidemia -Continue Lipitor 20 mg by mouth daily and fenofibrate 160 mg by mouth daily  5.  Gastroesophageal reflux disease -Continue Protonix  Code Status: Full code Family Communication: Spoke with his wife was present at bedside Disposition Plan: Patient undergoing stress test today.   Consultants:  Cardiology   HPI/Subjective: Patient is an 81 year old with a past medical history of nonobstructive coronary artery disease, history of pulmonary embolism not on chronic anticoagulation, states are chronic and disease who was admitted to medicine service on 03/29/2015 1 he present with complaints of chest pain. He reported having chest pain across his chest, that was characterized as pressure/heavy, lasted for approximately 30 minutes and radiated to his jaw. He denied shortness of breath, nausea, vomiting, diaphoresis. EKG  shows sinus rhythm, troponins were cycled and remained negative. On the following morning cardiology was consulted as he underwent stress test.  Objective: Filed Vitals:   03/30/15 1238  BP: 116/51  Pulse:   Temp:   Resp:     Intake/Output Summary (Last 24 hours) at 03/30/15 1314 Last data filed at 03/30/15 0918  Gross per 24 hour  Intake 598.75 ml  Output   1100 ml  Net -501.25 ml   Filed Weights   03/29/15 1832 03/29/15 2236 03/30/15 0511  Weight: 74.844 kg (165 lb) 73.211 kg (161 lb 6.4 oz) 73.573 kg (162 lb 3.2 oz)    Exam:   General:  Patient is in no acute distress, he is awake alert oriented, reporting having episodic chest pain earlier this morning  Cardiovascular: Regular rate and rhythm normal S1-S2 no murmur rubs or gallops  Respiratory: Normal respiratory effort, lungs are clear to auscultation bilaterally  Abdomen: Soft nontender nondistended  Musculoskeletal: Has trace edema to lower extremities bilaterally  Data Reviewed: Basic Metabolic Panel:  Recent Labs Lab 03/29/15 1914 03/30/15 0245  NA 139 136  K 5.5* 4.5  CL 105 105  CO2 28 24  GLUCOSE 111* 108*  BUN 31* 29*  CREATININE 1.69* 1.55*  CALCIUM 9.3 9.0   Liver Function Tests:  Recent Labs Lab 03/29/15 1914  AST 52*  ALT 22  ALKPHOS 59  BILITOT 1.1  PROT 6.0*  ALBUMIN 3.5   No results for input(s): LIPASE, AMYLASE in the last 168 hours. No results for input(s): AMMONIA in the last 168 hours. CBC:  Recent Labs Lab 03/29/15 1914  WBC 6.1  NEUTROABS 3.6  HGB 12.7*  HCT 37.2*  MCV 106.9*  PLT 146*   Cardiac Enzymes:  Recent Labs Lab 03/29/15 2213 03/30/15 0245 03/30/15 0421  TROPONINI <0.03 <0.03 <0.03   BNP (last 3 results) No results for input(s): BNP in the last 8760 hours.  ProBNP (last 3 results)  Recent Labs  06/03/14 0650  PROBNP 6915.0*    CBG: No results for input(s): GLUCAP in the last 168 hours.  No results found for this or any previous  visit (from the past 240 hour(s)).   Studies: Dg Chest 2 View  03/29/2015   CLINICAL DATA:  Mid chest in jaw pain starting today, weakness, history coronary artery disease, pulmonary embolism, asthma, hyperlipidemia, GERD  EXAM: CHEST  2 VIEW  COMPARISON:  11/07/2014  FINDINGS: Mild enlargement of cardiac silhouette.  Coronary arterial calcification noted.  Mediastinal contours and pulmonary vascularity normal.  Calcified RIGHT hilar adenopathy.  Lungs grossly clear.  No acute infiltrate, pleural effusion or pneumothorax.  Bones demineralized.  IMPRESSION: Old granulomatous disease.  No definite acute abnormalities.   Electronically Signed   By: [name] M.D.   On: 03/29/2015 19:41    Scheduled Meds: . aspirin EC  81 mg Oral Daily  . atorvastatin  20 mg Oral QHS  . docusate sodium  100 mg Oral Daily  . donepezil  10 mg Oral QHS  . enoxaparin (LOVENOX) injection  40 mg Subcutaneous QHS  . fenofibrate  160 mg Oral Daily  . gabapentin  200 mg Oral Daily   And  . gabapentin  100 mg Oral QHS  . loratadine  10 mg Oral Daily  . pantoprazole  40 mg Oral Daily  . psyllium  1 packet Oral Daily  . venlafaxine XR  75 mg Oral Q breakfast   Continuous Infusions:   Principal Problem:   Chest pain at rest Active Problems:   Coronary atherosclerosis   CKD (chronic kidney disease) stage 3, GFR 30-59 ml/min   Essential tremor   INCONTINENCE, URGE   HLD (hyperlipidemia)   GERD (gastroesophageal reflux disease)   Osteoporosis    Time spent: 35 min    [name]  [facility] Pager [phone]. If 7PM-7AM, please contact night-coverage at [email or web address], password [name] 03/30/2015, 1:14 PM

## 2015-04-05 DIAGNOSIS — N183 Chronic kidney disease, stage 3 (moderate): Secondary | ICD-10-CM | POA: Diagnosis not present

## 2015-04-05 DIAGNOSIS — G629 Polyneuropathy, unspecified: Secondary | ICD-10-CM | POA: Diagnosis not present

## 2015-04-05 DIAGNOSIS — R269 Unspecified abnormalities of gait and mobility: Secondary | ICD-10-CM | POA: Diagnosis not present

## 2015-04-05 DIAGNOSIS — S12600D Unspecified displaced fracture of seventh cervical vertebra, subsequent encounter for fracture with routine healing: Secondary | ICD-10-CM | POA: Diagnosis not present

## 2015-04-05 DIAGNOSIS — I2699 Other pulmonary embolism without acute cor pulmonale: Secondary | ICD-10-CM | POA: Diagnosis not present

## 2015-04-05 DIAGNOSIS — E119 Type 2 diabetes mellitus without complications: Secondary | ICD-10-CM | POA: Diagnosis not present

## 2015-04-06 ENCOUNTER — Telehealth: Payer: Self-pay

## 2015-04-06 DIAGNOSIS — N183 Chronic kidney disease, stage 3 (moderate): Secondary | ICD-10-CM | POA: Diagnosis not present

## 2015-04-06 DIAGNOSIS — E119 Type 2 diabetes mellitus without complications: Secondary | ICD-10-CM | POA: Diagnosis not present

## 2015-04-06 DIAGNOSIS — S12600D Unspecified displaced fracture of seventh cervical vertebra, subsequent encounter for fracture with routine healing: Secondary | ICD-10-CM | POA: Diagnosis not present

## 2015-04-06 DIAGNOSIS — G629 Polyneuropathy, unspecified: Secondary | ICD-10-CM | POA: Diagnosis not present

## 2015-04-06 DIAGNOSIS — R269 Unspecified abnormalities of gait and mobility: Secondary | ICD-10-CM | POA: Diagnosis not present

## 2015-04-06 DIAGNOSIS — I2699 Other pulmonary embolism without acute cor pulmonale: Secondary | ICD-10-CM | POA: Diagnosis not present

## 2015-04-06 NOTE — Telephone Encounter (Signed)
Stacy PT with [facility] left v/m requesting verbal order to add one more visit so Marzetta Board can go out to do pts discharge from home health PT today. Stacy request cb today.

## 2015-04-06 NOTE — Telephone Encounter (Signed)
Stacy advised 

## 2015-04-06 NOTE — Telephone Encounter (Signed)
Please give the order.  Thanks.   

## 2015-04-08 ENCOUNTER — Encounter (HOSPITAL_COMMUNITY): Payer: Self-pay | Admitting: Family Medicine

## 2015-04-09 ENCOUNTER — Ambulatory Visit (INDEPENDENT_AMBULATORY_CARE_PROVIDER_SITE_OTHER): Payer: Medicare Other | Admitting: Family Medicine

## 2015-04-09 ENCOUNTER — Encounter: Payer: Self-pay | Admitting: Family Medicine

## 2015-04-09 VITALS — BP 110/60 | HR 80 | Temp 98.0°F | Wt 164.2 lb

## 2015-04-09 DIAGNOSIS — F0390 Unspecified dementia without behavioral disturbance: Secondary | ICD-10-CM

## 2015-04-09 DIAGNOSIS — R079 Chest pain, unspecified: Secondary | ICD-10-CM

## 2015-04-09 DIAGNOSIS — E785 Hyperlipidemia, unspecified: Secondary | ICD-10-CM | POA: Diagnosis not present

## 2015-04-09 DIAGNOSIS — I2510 Atherosclerotic heart disease of native coronary artery without angina pectoris: Secondary | ICD-10-CM | POA: Diagnosis not present

## 2015-04-09 DIAGNOSIS — N183 Chronic kidney disease, stage 3 unspecified: Secondary | ICD-10-CM

## 2015-04-09 DIAGNOSIS — L270 Generalized skin eruption due to drugs and medicaments taken internally: Secondary | ICD-10-CM | POA: Insufficient documentation

## 2015-04-09 DIAGNOSIS — K219 Gastro-esophageal reflux disease without esophagitis: Secondary | ICD-10-CM

## 2015-04-09 DIAGNOSIS — R21 Rash and other nonspecific skin eruption: Secondary | ICD-10-CM

## 2015-04-09 NOTE — Patient Instructions (Addendum)
Return tomorrow morning at your convenience for fasting labs. Continue to hold aricept until itching/rash is gone, then could try again. If persistent trouble with itching let me know.  You are doing well today, good to see you, call us with quesitons.

## 2015-04-09 NOTE — Progress Notes (Addendum)
BP 110/60 mmHg  Pulse 80  Temp(Src) 98 F (36.7 C) (Oral)  Wt 164 lb 4 oz (74.503 kg)   CC: hosp f/u visit  Subjective:    Patient ID: [name], male    DOB: [birth year], 81 y.o.   MRN: [id number]  HPI: [name] is a 81 y.o. male presenting on 04/09/2015 for Follow-up   Mr Kohen presents with wife, [name] for hospital f/u visit. Admitted 7/21 overnight with chest pain, with normal stress test, normal D dimer. Thought possibly GERD related. Not reproducible. Cr 1.55 on discharge.   Described substernal chest pain and pressure with radiation to the jaw. Improved with aspirin and fentanyl and gi cocktail in hospital. Aricept was recently started, and he had recently had a cortisone injection for a rash. He underwent a normal nuclear stress test by cardiology with EF 76% without reversible ischemia. No more chest pain since hospitalization.   Has been itching throughout body (arms, legs, trunk and back) treated with lotion. Wonders if aricept related - this was stopped 1 week ago.   Admit date: 03/29/2015 Discharge date: 03/30/2015 F/u phone call not performed.  Recommendations for Outpatient Follow-up:  1. Patient admitted for chest pain having a negative stress test. Could be related to gastroesophageal reflux disease, please follow-up on symptoms. 2. Please follow-up on a BMP on hospital follow-up. He is not have acute kidney injury initially presenting with a creatinine of 1.69, trending down to 1.55 on day of discharge.  Discharge Diagnoses:  Principal Problem:  Chest pain at rest Active Problems:  Coronary atherosclerosis  CKD (chronic kidney disease) stage 3, GFR 30-59 ml/min  Essential tremor  INCONTINENCE, URGE  HLD (hyperlipidemia)  GERD (gastroesophageal reflux disease)  Osteoporosis  Discharge Condition: Stable Diet recommendation: Heart healthy  Relevant past medical, surgical, family and social history reviewed and updated as indicated.  Interim medical history since our last visit reviewed. Allergies and medications reviewed and updated. Current Outpatient Prescriptions on File Prior to Visit  Medication Sig  . albuterol (PROVENTIL HFA;VENTOLIN HFA) 108 (90 BASE) MCG/ACT inhaler Inhale 2 puffs into the lungs every 6 (six) hours as needed for wheezing or shortness of breath.  Marland Kitchen aspirin EC 81 MG tablet Take 81 mg by mouth daily.  . Cholecalciferol (VITAMIN D) 2000 UNITS CAPS Take 1 capsule by mouth daily.  . Coenzyme Q10 (COQ-10) 100 MG CAPS Take 1 capsule by mouth at bedtime.   . Cyanocobalamin (B-12 PO) Take 10,000 mcg by mouth every Monday, Wednesday, and Friday.   Marland Kitchen DEXILANT 60 MG capsule TAKE 1 CAPSULE DAILY  . docusate sodium (COLACE) 100 MG capsule Take 100 mg by mouth daily.  . fenofibrate (TRICOR) 145 MG tablet TAKE 1 TABLET DAILY  . fexofenadine (ALLEGRA) 180 MG tablet Take 180 mg by mouth every evening.   . gabapentin (NEURONTIN) 100 MG capsule Take 100 mg by mouth as directed. 200mg  in am and 100mg  in pm  . guaiFENesin (MUCINEX) 600 MG 12 hr tablet Take 1 tablet (600 mg total) by mouth 2 (two) times daily. (Patient taking differently: Take 600 mg by mouth 2 (two) times daily as needed. )  . Multiple Vitamins-Minerals (MULTIVITAMIN PO) Take by mouth daily.  Marland Kitchen oxybutynin (DITROPAN) 5 MG tablet Take 5 mg by mouth 2 (two) times daily.   . psyllium (METAMUCIL SMOOTH TEXTURE) 28 % packet Take 1 packet by mouth daily.  Marland Kitchen venlafaxine (EFFEXOR) 75 MG tablet Take 75 mg by mouth daily.  Marland Kitchen donepezil (  ARICEPT) 10 MG tablet Take 1 tablet (10 mg total) by mouth at bedtime. (Patient not taking: Reported on 04/09/2015)   No current facility-administered medications on file prior to visit.    Review of Systems Per HPI unless specifically indicated above     Objective:    BP 110/60 mmHg  Pulse 80  Temp(Src) 98 F (36.7 C) (Oral)  Wt 164 lb 4 oz (74.503 kg)  Wt Readings from Last 3 Encounters:  04/09/15 164 lb 4 oz (74.503  kg)  03/30/15 162 lb 3.2 oz (73.573 kg)  03/26/15 166 lb (75.297 kg)    Physical Exam  Constitutional: He appears well-developed and well-nourished. No distress.  HENT:  Mouth/Throat: Oropharynx is clear and moist. No oropharyngeal exudate.  Neck: Normal range of motion. Neck supple. No thyromegaly present.  Cardiovascular: Normal rate, regular rhythm, normal heart sounds and intact distal pulses.   No murmur heard. Pulmonary/Chest: Effort normal and breath sounds normal. No respiratory distress. He has no wheezes. He has no rales.  Musculoskeletal: He exhibits no edema.  Skin: Skin is warm and dry. Rash noted.  Faint rare papular rash on trunk, arms and legs  Nursing note and vitals reviewed.      Assessment & Plan:  All hospital records reviewed. Problem List Items Addressed This Visit    Chest pain at rest - Primary    Reviewed workup in hospital - overall unrevealing. Normal stress test, neg D dimer. ?GERD related. Continue dexilant 60mg  daily. asxs since home from hospital      CKD (chronic kidney disease) stage 3, GFR 30-59 ml/min    Cr on discharge 1.55 - worse than prior. Recheck tomorrow.      Relevant Orders   Renal function panel   Dementia    aricept was started 01/2015 and increased to 10mg  on 02/2015. ?related to chest discomfort if GERD related. Also now ?rash relation - pt currently holding aricept, consider retrial of med once rash/itching has resolved. Pt/wife agree with plan.      GERD (gastroesophageal reflux disease)    Continue dexilant 60mg  daily.      HLD (hyperlipidemia)    Return tomorrow fasting for FLP on lower statin dose.      Relevant Medications   atorvastatin (LIPITOR) 20 MG tablet   Other Relevant Orders   Lipid panel   Skin rash    ? If aricept related. Currently med on hold - consider re-trial down the road. Continue regular moisturizer, avoid sedating antihistamine for now (continue allegra).          Follow up plan: Return  in about 3 months (around 07/10/2015), or as needed, for follow up visit.

## 2015-04-09 NOTE — Assessment & Plan Note (Signed)
Cr on discharge 1.55 - worse than prior. Recheck tomorrow.

## 2015-04-09 NOTE — Assessment & Plan Note (Addendum)
?   If aricept related. Currently med on hold - consider re-trial down the road. Continue regular moisturizer, avoid sedating antihistamine for now (continue allegra).

## 2015-04-09 NOTE — Assessment & Plan Note (Signed)
Continue dexilant 60mg  daily.

## 2015-04-09 NOTE — Assessment & Plan Note (Signed)
Reviewed workup in hospital - overall unrevealing. Normal stress test, neg D dimer. ?GERD related. Continue dexilant 60mg  daily. asxs since home from hospital

## 2015-04-09 NOTE — Assessment & Plan Note (Signed)
Return tomorrow fasting for FLP on lower statin dose.

## 2015-04-09 NOTE — Addendum Note (Signed)
Addended by: Ria Bush on: 04/09/2015 01:37 PM   Modules accepted: Orders

## 2015-04-09 NOTE — Progress Notes (Signed)
Pre visit review using our clinic review tool, if applicable. No additional management support is needed unless otherwise documented below in the visit note. 

## 2015-04-09 NOTE — Assessment & Plan Note (Signed)
aricept was started 01/2015 and increased to 10mg  on 02/2015. ?related to chest discomfort if GERD related. Also now ?rash relation - pt currently holding aricept, consider retrial of med once rash/itching has resolved. Pt/wife agree with plan.

## 2015-04-10 ENCOUNTER — Other Ambulatory Visit (INDEPENDENT_AMBULATORY_CARE_PROVIDER_SITE_OTHER): Payer: Medicare Other

## 2015-04-10 ENCOUNTER — Ambulatory Visit: Payer: Medicare Other | Admitting: Family Medicine

## 2015-04-10 DIAGNOSIS — E785 Hyperlipidemia, unspecified: Secondary | ICD-10-CM | POA: Diagnosis not present

## 2015-04-10 DIAGNOSIS — N183 Chronic kidney disease, stage 3 unspecified: Secondary | ICD-10-CM

## 2015-04-10 LAB — RENAL FUNCTION PANEL
Albumin: 3.9 g/dL (ref 3.5–5.2)
BUN: 35 mg/dL — AB (ref 6–23)
CALCIUM: 10 mg/dL (ref 8.4–10.5)
CO2: 31 meq/L (ref 19–32)
Chloride: 105 mEq/L (ref 96–112)
Creatinine, Ser: 1.51 mg/dL — ABNORMAL HIGH (ref 0.40–1.50)
GFR: 47.29 mL/min — ABNORMAL LOW (ref >60.00)
Glucose, Bld: 104 mg/dL — ABNORMAL HIGH (ref 70–99)
PHOSPHORUS: 3.4 mg/dL (ref 2.3–4.6)
Potassium: 4.8 mEq/L (ref 3.5–5.1)
SODIUM: 141 meq/L (ref 135–145)

## 2015-04-10 LAB — LIPID PANEL
Cholesterol: 153 mg/dL (ref 0–200)
HDL: 31.1 mg/dL — ABNORMAL LOW (ref >39.00)
LDL CALC: 91 mg/dL (ref 0–99)
NonHDL: 121.47
TRIGLYCERIDES: 150 mg/dL — AB (ref 0.0–149.0)
Total CHOL/HDL Ratio: 5
VLDL: 30 mg/dL (ref 0.0–40.0)

## 2015-05-02 ENCOUNTER — Ambulatory Visit (INDEPENDENT_AMBULATORY_CARE_PROVIDER_SITE_OTHER): Payer: Medicare Other | Admitting: Primary Care

## 2015-05-02 ENCOUNTER — Encounter: Payer: Self-pay | Admitting: Primary Care

## 2015-05-02 VITALS — BP 114/64 | HR 73 | Temp 98.0°F | Wt 166.0 lb

## 2015-05-02 DIAGNOSIS — R21 Rash and other nonspecific skin eruption: Secondary | ICD-10-CM | POA: Diagnosis not present

## 2015-05-02 DIAGNOSIS — I2510 Atherosclerotic heart disease of native coronary artery without angina pectoris: Secondary | ICD-10-CM | POA: Diagnosis not present

## 2015-05-02 MED ORDER — CLOTRIMAZOLE 1 % EX CREA
1.0000 | TOPICAL_CREAM | Freq: Two times a day (BID) | CUTANEOUS | Status: AC
Start: 2015-05-02 — End: ?

## 2015-05-02 MED ORDER — TRIAMCINOLONE ACETONIDE 0.1 % EX CREA: 1.0000 "application " | TOPICAL_CREAM | Freq: Two times a day (BID) | CUTANEOUS | Status: DC

## 2015-05-02 NOTE — Assessment & Plan Note (Signed)
No improvement since holding Aricept. Itchy. Rash to posterior trunk appears fungal, will have wife purchase clotrimazole and apply BID. Rash to anterior trunk appears to be eczema, will send triamcinolone cream to apply BID. She will call and update [place] if no improvement. Will consider sending to derm if no improvement.

## 2015-05-02 NOTE — Progress Notes (Signed)
Subjective:    Patient ID: [name], male    DOB: Sep 11, 1930, 81 y.o.   MRN: [id number]  HPI  [name] is an 81 year old male who presents today with a chief complaint of rash. The rash was evaluated on 04/09/2015 and was questioned to be caused by recent addition of Aricept. He was encouraged to hold Aricept, continue moisturizer, and Allegra. He was also evaluated for the same type of rash on July 18th and was provided with a shot of Depo Medrol 80. Overall his rash continues to be present and is now worse. His rash is present to the anterior and posterior trunk and bilateral lower extremities. His rash is itchy in nature. He's been using moisturizer and taking allegra. His wife had applied an old prescription of Bactroban cream without relief.   Review of Systems  Constitutional: Negative for fever and chills.  Skin: Positive for rash.       Past Medical History  Diagnosis Date  . GERD (gastroesophageal reflux disease)     h/o PUD  . CAD (coronary artery disease)     cath 2000 30% single vessel, normal nuclear stress test 12/03/2010, no evidence ischemia  . CKD (chronic kidney disease) stage 3, GFR 30-59 ml/min     baseline Cr 1.7  . Depression   . Hx pulmonary embolism 08/1991    negative hypercoagulable panel 12/2014  . History of phlebitis   . Allergic rhinitis   . Urge incontinence   . Elevated PSA     previous-normalized (followed by Dr. Jeffie Pollock, rec no repeat unless urinary sxs)  . HLD (hyperlipidemia)     hypertriglyceridemia  . Asthma     remote  . Arthritis   . Blood transfusion 1990's  . Schatzki's ring 02/2012    s/p dilation [name]  . History of fracture of right hip   . Hearing loss 06/2012    eval - rec annual exam  . Essential tremor 09/27/2008  . Prediabetes   . DISH (diffuse idiopathic skeletal hyperostosis) 03/2013    lumbar spine on xray  . DDD (degenerative disc disease) 03/2013    by CT, diffuse multilevel cervical and lumbar spondylosis  .  Osteoporosis 11/2013    DEXA T score -2.7  . Macrocytic anemia     stable B12/folate and periph smear 05/2013  . Sleep apnea     no CPAP- sleep apnea "cleared up 15 years ago"  . Dizziness     multifactorial, s/p PT at [facility] 06/2014 with HEP  . History of pyelonephritis 05/2014    hospitalization with sepsis  . Closed C7 fracture 11/06/2014    Social History   Social History  . Marital Status: Married    Spouse Name: N/A  . Number of Children: 2  . Years of Education: N/A   Occupational History  . 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 Smoking status: Never Smoker   . Smokeless tobacco: Never Used  . Alcohol Use: No  . Drug Use: No  . Sexual Activity: Not on file   Other Topics Concern  . Not on file   Social History Narrative   Married with 2 children   Husband of [name]    Retired: was principal    Activity: walks dog 2-3 times daily about 61min, frequent stops    Diet: some water, good fruits/vegetables, fish 1x/wk, no sodas.      Advanced directives: HCPOA  is wife, [name]. Does not want prolonged life support if terminal    Past Surgical History  Procedure Laterality Date  . Tonsillectomy  1965  . Hernia repair  1979    Left  . Cystoscopy  1992    for kidney stones  . Exploratory laparotomy  1996    with incidental appendectomy  . Appendectomy  1996  . Knee arthroscopy  03/24/02    Right (Dr. Mauri Pole)  . Orif femoral neck fracture w/ dhs  08/03/02    Right (Dr. Mauri Pole)  . Ct abd w & pelvis wo cm  07/2001    Scarring of right lung, stable negative o/w  . Ct abd w & pelvis wo cm  11/2000    ? stones, right LL scarring  . V/q scan  06/1999    negative  . Colonoscopy  1998    [place]  . US echocardiography  12/28/2007    Mild aortic valve calcification EF 55%, basically nml  . Carotid u/s  12/28/2007    nml  . Eeg  11/12/2009    nml  . Mri  11/2009    Head, nml  . Cataract extraction  Jan, March 2011      bilateral  . Colonoscopy  07/2010    5 polyps, adenomatous, rec rpt 3 yrs  . Esophagogastroduodenoscopy  02/2012    dilation of schatzki's ring [name]  . Upper gastrointestinal endoscopy    . Colonoscopy  04/2014    3 polyps, adenomatous, f/u open ended given age [name]  . Cardiac catheterization  2000    30% one vessel  . Cardiovascular stress test  03/2015    no ischemia, low risk, EF 76%    Family History  Problem Relation Age of Onset  . Stroke Mother   . Hypertension Mother   . Cancer Brother     prostate with mets  . Blindness Brother     legally  . Diabetes Brother   . Pulmonary embolism Sister     from shoulder operation  . Alcohol abuse Brother   . Colon cancer Neg Hx   . Esophageal cancer Neg Hx   . Rectal cancer Neg Hx   . Stomach cancer Neg Hx     Allergies  Allergen Reactions  . Lactose Intolerance (Gi)   . Nizatidine     REACTION: Rash (Axid)  . Penicillins     REACTION: Rash  . Sulfadiazine     REACTION: Hives    Current Outpatient Prescriptions on File Prior to Visit  Medication Sig Dispense Refill  . albuterol (PROVENTIL HFA;VENTOLIN HFA) 108 (90 BASE) MCG/ACT inhaler Inhale 2 puffs into the lungs every 6 (six) hours as needed for wheezing or shortness of breath.    Marland Kitchen aspirin EC 81 MG tablet Take 81 mg by mouth daily.    Marland Kitchen atorvastatin (LIPITOR) 20 MG tablet Take 20 mg by mouth daily.    . Cholecalciferol (VITAMIN D) 2000 UNITS CAPS Take 1 capsule by mouth daily.    . Coenzyme Q10 (COQ-10) 100 MG CAPS Take 1 capsule by mouth at bedtime.     . Cyanocobalamin (B-12 PO) Take 10,000 mcg by mouth every Monday, Wednesday, and Friday.     Marland Kitchen DEXILANT 60 MG capsule TAKE 1 CAPSULE DAILY 90 capsule 2  . docusate sodium (COLACE) 100 MG capsule Take 100 mg by mouth daily.    . fenofibrate (TRICOR) 145 MG tablet TAKE 1 TABLET DAILY 90 tablet 1  . fexofenadine (  ALLEGRA) 180 MG tablet Take 180 mg by mouth every evening.     . gabapentin (NEURONTIN) 100 MG  capsule Take 100 mg by mouth as directed. 200mg  in am and 100mg  in pm    . guaiFENesin (MUCINEX) 600 MG 12 hr tablet Take 1 tablet (600 mg total) by mouth 2 (two) times daily. (Patient taking differently: Take 600 mg by mouth 2 (two) times daily as needed. ) 20 tablet 0  . Multiple Vitamins-Minerals (MULTIVITAMIN PO) Take by mouth daily.    Marland Kitchen oxybutynin (DITROPAN) 5 MG tablet Take 5 mg by mouth 2 (two) times daily.     . psyllium (METAMUCIL SMOOTH TEXTURE) 28 % packet Take 1 packet by mouth daily.    Marland Kitchen venlafaxine (EFFEXOR) 75 MG tablet Take 75 mg by mouth daily.    Marland Kitchen donepezil (ARICEPT) 10 MG tablet Take 1 tablet (10 mg total) by mouth at bedtime. (Patient not taking: Reported on 05/02/2015) 30 tablet 3   No current facility-administered medications on file prior to visit.    BP 114/64 mmHg  Pulse 73  Temp(Src) 98 F (36.7 C) (Oral)  Wt 166 lb (75.297 kg)  SpO2 95%    Objective:   Physical Exam  Constitutional: He appears well-nourished.  Cardiovascular: Normal rate and regular rhythm.   Pulmonary/Chest: Effort normal and breath sounds normal.  Skin: Skin is warm and dry.  Mild rash to posterior trunk, appears fungal in nature. Mild rash to anterior trunk, appears more like eczema. No drainage, swelling.          Assessment & Plan:

## 2015-05-02 NOTE — Progress Notes (Signed)
Pre visit review using our clinic review tool, if applicable. No additional management support is needed unless otherwise documented below in the visit note. 

## 2015-05-02 NOTE — Patient Instructions (Signed)
Apply Clotrimazole twice daily to his back. Apply Triamcinolone twice daily to his chest and arms.  Please let us know if the rash is not improved with the cream.  It was a pleasure meeting you!

## 2015-05-09 DIAGNOSIS — L820 Inflamed seborrheic keratosis: Secondary | ICD-10-CM | POA: Diagnosis not present

## 2015-05-09 DIAGNOSIS — L309 Dermatitis, unspecified: Secondary | ICD-10-CM | POA: Diagnosis not present

## 2015-05-09 DIAGNOSIS — L281 Prurigo nodularis: Secondary | ICD-10-CM | POA: Diagnosis not present

## 2015-05-09 DIAGNOSIS — R21 Rash and other nonspecific skin eruption: Secondary | ICD-10-CM | POA: Diagnosis not present

## 2015-05-09 DIAGNOSIS — L905 Scar conditions and fibrosis of skin: Secondary | ICD-10-CM | POA: Diagnosis not present

## 2015-05-09 DIAGNOSIS — L821 Other seborrheic keratosis: Secondary | ICD-10-CM | POA: Diagnosis not present

## 2015-05-09 DIAGNOSIS — D485 Neoplasm of uncertain behavior of skin: Secondary | ICD-10-CM | POA: Diagnosis not present

## 2015-05-12 ENCOUNTER — Ambulatory Visit (INDEPENDENT_AMBULATORY_CARE_PROVIDER_SITE_OTHER): Payer: Medicare Other | Admitting: Family Medicine

## 2015-05-12 ENCOUNTER — Encounter: Payer: Self-pay | Admitting: Family Medicine

## 2015-05-12 VITALS — BP 90/60 | Temp 98.6°F | Ht 66.0 in | Wt 167.0 lb

## 2015-05-12 DIAGNOSIS — N390 Urinary tract infection, site not specified: Secondary | ICD-10-CM

## 2015-05-12 DIAGNOSIS — R300 Dysuria: Secondary | ICD-10-CM | POA: Diagnosis not present

## 2015-05-12 DIAGNOSIS — I2510 Atherosclerotic heart disease of native coronary artery without angina pectoris: Secondary | ICD-10-CM | POA: Diagnosis not present

## 2015-05-12 LAB — POCT URINALYSIS DIPSTICK
Bilirubin, UA: NEGATIVE
Glucose, UA: NEGATIVE
Ketones, UA: NEGATIVE
NITRITE UA: NEGATIVE
Protein, UA: POSITIVE
RBC UA: NEGATIVE
SPEC GRAV UA: 1.025
UROBILINOGEN UA: 0.2
pH, UA: 6

## 2015-05-12 MED ORDER — CIPROFLOXACIN HCL 500 MG PO TABS: 500.0000 mg | ORAL_TABLET | Freq: Two times a day (BID) | ORAL | Status: DC

## 2015-05-12 NOTE — Progress Notes (Signed)
Subjective:    Patient ID: [name], male    DOB: 1931/09/04, 81 y.o.   MRN: [id number]  Chief Complaint  Patient presents with  . Dysuria    HPI Patient is in today for evaluation of urinary complaints. He notes urinary frequency and urgency with some suprpubic pressure. No back pain, fevers, chills, malaise, anorexia. Denies CP/palp/SOB/HA/congestion/fevers/GI c/o. Taking meds as prescribed  Past Medical History  Diagnosis Date  . GERD (gastroesophageal reflux disease)     h/o PUD  . CAD (coronary artery disease)     cath 2000 30% single vessel, normal nuclear stress test 12/03/2010, no evidence ischemia  . CKD (chronic kidney disease) stage 3, GFR 30-59 ml/min     baseline Cr 1.7  . Depression   . Hx pulmonary embolism 08/1991    negative hypercoagulable panel 12/2014  . History of phlebitis   . Allergic rhinitis   . Urge incontinence   . Elevated PSA     previous-normalized (followed by Dr. Jeffie Pollock, rec no repeat unless urinary sxs)  . HLD (hyperlipidemia)     hypertriglyceridemia  . Asthma     remote  . Arthritis   . Blood transfusion 1990's  . Schatzki's ring 02/2012    s/p dilation [name]  . History of fracture of right hip   . Hearing loss 06/2012    eval - rec annual exam  . Essential tremor 09/27/2008  . Prediabetes   . DISH (diffuse idiopathic skeletal hyperostosis) 03/2013    lumbar spine on xray  . DDD (degenerative disc disease) 03/2013    by CT, diffuse multilevel cervical and lumbar spondylosis  . Osteoporosis 11/2013    DEXA T score -2.7  . Macrocytic anemia     stable B12/folate and periph smear 05/2013  . Sleep apnea     no CPAP- sleep apnea "cleared up 15 years ago"  . Dizziness     multifactorial, s/p PT at [facility] 06/2014 with HEP  . History of pyelonephritis 05/2014    hospitalization with sepsis  . Closed C7 fracture 11/06/2014    Past Surgical History  Procedure Laterality Date  . Tonsillectomy  1965  . Hernia repair  1979    Left    . Cystoscopy  1992    for kidney stones  . Exploratory laparotomy  1996    with incidental appendectomy  . Appendectomy  1996  . Knee arthroscopy  03/24/02    Right (Dr. Mauri Pole)  . Orif femoral neck fracture w/ dhs  08/03/02    Right (Dr. Mauri Pole)  . Ct abd w & pelvis wo cm  07/2001    Scarring of right lung, stable negative o/w  . Ct abd w & pelvis wo cm  11/2000    ? stones, right LL scarring  . V/q scan  06/1999    negative  . [place]oscopy  1998    [place]  . US echocardiography  12/28/2007    Mild aortic valve calcification EF 55%, basically nml  . Carotid u/s  12/28/2007    nml  . Eeg  11/12/2009    nml  . Mri  11/2009    Head, nml  . Cataract extraction  Jan, March 2011    bilateral  . Colonoscopy  07/2010    5 polyps, adenomatous, rec rpt 3 yrs  . Esophagogastroduodenoscopy  02/2012    dilation of schatzki's ring [name]  . Upper gastrointestinal endoscopy    . Colonoscopy  04/2014  3 polyps, adenomatous, f/u open ended given age [name]  . Cardiac catheterization  2000    30% one vessel  . Cardiovascular stress test  03/2015    no ischemia, low risk, EF 76%    Family History  Problem Relation Age of Onset  . Stroke Mother   . Hypertension Mother   . Cancer Brother     prostate with mets  . Blindness Brother     legally  . Diabetes Brother   . Pulmonary embolism Sister     from shoulder operation  . Alcohol abuse Brother   . Colon cancer Neg Hx   . Esophageal cancer Neg Hx   . Rectal cancer Neg Hx   . Stomach cancer Neg Hx     Social History   Social History  . Marital Status: Married    Spouse Name: N/A  . Number of Children: 2  . Years of Education: N/A   Occupational History  . 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 Smoking status: Never Smoker   . Smokeless tobacco: Never Used  . Alcohol Use: No  . Drug Use: No  . Sexual Activity: Not on file   Other Topics Concern  . Not on file    Social History Narrative   Married with 2 children   Husband of Traver Meckes    Retired: was principal    Activity: walks dog 2-3 times daily about 80min, frequent stops    Diet: some water, good fruits/vegetables, fish 1x/wk, no sodas.      Advanced directives: Chauncey Reading is wife, [name]. Does not want prolonged life support if terminal    Outpatient Prescriptions Prior to Visit  Medication Sig Dispense Refill  . albuterol (PROVENTIL HFA;VENTOLIN HFA) 108 (90 BASE) MCG/ACT inhaler Inhale 2 puffs into the lungs every 6 (six) hours as needed for wheezing or shortness of breath.    Marland Kitchen aspirin EC 81 MG tablet Take 81 mg by mouth daily.    Marland Kitchen atorvastatin (LIPITOR) 20 MG tablet Take 20 mg by mouth daily.    . Cholecalciferol (VITAMIN D) 2000 UNITS CAPS Take 1 capsule by mouth daily.    . clotrimazole (LOTRIMIN) 1 % cream Apply 1 application topically 2 (two) times daily. 30 g 0  . Coenzyme Q10 (COQ-10) 100 MG CAPS Take 1 capsule by mouth at bedtime.     . Cyanocobalamin (B-12 PO) Take 10,000 mcg by mouth every Monday, Wednesday, and Friday.     Marland Kitchen DEXILANT 60 MG capsule TAKE 1 CAPSULE DAILY 90 capsule 2  . docusate sodium (COLACE) 100 MG capsule Take 100 mg by mouth daily.    . fenofibrate (TRICOR) 145 MG tablet TAKE 1 TABLET DAILY 90 tablet 1  . fexofenadine (ALLEGRA) 180 MG tablet Take 180 mg by mouth every evening.     . gabapentin (NEURONTIN) 100 MG capsule Take 100 mg by mouth as directed. 200mg  in am and 100mg  in pm    . guaiFENesin (MUCINEX) 600 MG 12 hr tablet Take 1 tablet (600 mg total) by mouth 2 (two) times daily. (Patient taking differently: Take 600 mg by mouth 2 (two) times daily as needed. ) 20 tablet 0  . Multiple Vitamins-Minerals (MULTIVITAMIN PO) Take by mouth daily.    Marland Kitchen oxybutynin (DITROPAN) 5 MG tablet Take 5 mg by mouth 2 (two) times daily.     . psyllium (METAMUCIL SMOOTH TEXTURE) 28 % packet Take 1 packet by mouth daily.    Marland Kitchen  triamcinolone cream (KENALOG) 0.1 % Apply 1  application topically 2 (two) times daily. 30 g 0  . venlafaxine (EFFEXOR) 75 MG tablet Take 75 mg by mouth daily.    Marland Kitchen donepezil (ARICEPT) 10 MG tablet Take 1 tablet (10 mg total) by mouth at bedtime. (Patient not taking: Reported on 05/12/2015) 30 tablet 3   No facility-administered medications prior to visit.    Allergies  Allergen Reactions  . Lactose Intolerance (Gi)   . Nizatidine     REACTION: Rash (Axid)  . Penicillins     REACTION: Rash  . Sulfadiazine     REACTION: Hives    Review of Systems  Constitutional: Negative for fever and malaise/fatigue.  HENT: Negative for congestion.   Eyes: Negative for discharge.  Respiratory: Negative for shortness of breath.   Cardiovascular: Negative for chest pain, palpitations and leg swelling.  Gastrointestinal: Negative for nausea and abdominal pain.  Genitourinary: Positive for dysuria, urgency and frequency. Negative for hematuria and flank pain.  Musculoskeletal: Negative for falls.  Skin: Negative for rash.  Neurological: Negative for loss of consciousness and headaches.  Endo/Heme/Allergies: Negative for environmental allergies.  Psychiatric/Behavioral: Negative for depression. The patient is not nervous/anxious.        Objective:    Physical Exam  Constitutional: He is oriented to person, place, and time. He appears well-developed and well-nourished. No distress.  HENT:  Head: Normocephalic and atraumatic.  Nose: Nose normal.  Eyes: Right eye exhibits no discharge. Left eye exhibits no discharge.  Neck: Normal range of motion. Neck supple.  Cardiovascular: Normal rate and regular rhythm.   No murmur heard. Pulmonary/Chest: Effort normal and breath sounds normal.  Abdominal: Soft. Bowel sounds are normal. There is no tenderness.  Musculoskeletal: He exhibits no edema.  Neurological: He is alert and oriented to person, place, and time.  Skin: Skin is warm and dry.  Psychiatric: He has a normal mood and affect.    Nursing note and vitals reviewed.   BP 90/60 mmHg  Temp(Src) 98.6 F (37 C) (Oral)  Ht 5\' 6"  (1.676 m)  Wt 167 lb (75.751 kg)  BMI 26.97 kg/m2 Wt Readings from Last 3 Encounters:  05/12/15 167 lb (75.751 kg)  05/02/15 166 lb (75.297 kg)  04/09/15 164 lb 4 oz (74.503 kg)     Lab Results  Component Value Date   WBC 6.1 03/29/2015   HGB 12.7* 03/29/2015   HCT 37.2* 03/29/2015   PLT 146* 03/29/2015   GLUCOSE 104* 04/10/2015   CHOL 153 04/10/2015   TRIG 150.0* 04/10/2015   HDL 31.10* 04/10/2015   LDLDIRECT 82.6 11/26/2009   LDLCALC 91 04/10/2015   ALT 22 03/29/2015   AST 52* 03/29/2015   NA 141 04/10/2015   K 4.8 04/10/2015   CL 105 04/10/2015   CREATININE 1.51* 04/10/2015   BUN 35* 04/10/2015   CO2 31 04/10/2015   TSH 1.02 11/14/2013   PSA 1.86 06/02/2011   INR 1.12 03/29/2015   HGBA1C 5.5 06/02/2013   MICROALBUR 0.9 11/26/2009    Lab Results  Component Value Date   TSH 1.02 11/14/2013   Lab Results  Component Value Date   WBC 6.1 03/29/2015   HGB 12.7* 03/29/2015   HCT 37.2* 03/29/2015   MCV 106.9* 03/29/2015   PLT 146* 03/29/2015   Lab Results  Component Value Date   NA 141 04/10/2015   K 4.8 04/10/2015   CO2 31 04/10/2015   GLUCOSE 104* 04/10/2015   BUN 35* 04/10/2015  CREATININE 1.51* 04/10/2015   BILITOT 1.1 03/29/2015   ALKPHOS 59 03/29/2015   AST 52* 03/29/2015   ALT 22 03/29/2015   PROT 6.0* 03/29/2015   ALBUMIN 3.9 04/10/2015   CALCIUM 10.0 04/10/2015   ANIONGAP 7 03/30/2015   GFR 47.29* 04/10/2015   Lab Results  Component Value Date   CHOL 153 04/10/2015   Lab Results  Component Value Date   HDL 31.10* 04/10/2015   Lab Results  Component Value Date   LDLCALC 91 04/10/2015   Lab Results  Component Value Date   TRIG 150.0* 04/10/2015   Lab Results  Component Value Date   CHOLHDL 5 04/10/2015   Lab Results  Component Value Date   HGBA1C 5.5 06/02/2013       Assessment & Plan:   UTI (urinary tract  infection) Urine sent for culture, started on Cipro and probiotics   I am having Mr. Kanady maintain his CoQ-10, fexofenadine, Cyanocobalamin (B-12 PO), Vitamin D, Multiple Vitamins-Minerals (MULTIVITAMIN PO), albuterol, gabapentin, oxybutynin, venlafaxine, guaiFENesin, psyllium, docusate sodium, aspirin EC, donepezil, DEXILANT, fenofibrate, atorvastatin, clotrimazole, and triamcinolone cream.  No orders of the defined types were placed in this encounter.     Penni Homans, MD

## 2015-05-12 NOTE — Patient Instructions (Signed)
Start a daily probiotic daily such as [employer] or [facility] daily    Urinary Tract Infection Urinary tract infections (UTIs) can develop anywhere along your urinary tract. Your urinary tract is your body's drainage system for removing wastes and extra water. Your urinary tract includes two kidneys, two ureters, a bladder, and a urethra. Your kidneys are a pair of bean-shaped organs. Each kidney is about the size of your fist. They are located below your ribs, one on each side of your spine. CAUSES Infections are caused by microbes, which are microscopic organisms, including fungi, viruses, and bacteria. These organisms are so small that they can only be seen through a microscope. Bacteria are the microbes that most commonly cause UTIs. SYMPTOMS  Symptoms of UTIs may vary by age and gender of the patient and by the location of the infection. Symptoms in young women typically include a frequent and intense urge to urinate and a painful, burning feeling in the bladder or urethra during urination. Older women and men are more likely to be tired, shaky, and weak and have muscle aches and abdominal pain. A fever may mean the infection is in your kidneys. Other symptoms of a kidney infection include pain in your back or sides below the ribs, nausea, and vomiting. DIAGNOSIS To diagnose a UTI, your caregiver will ask you about your symptoms. Your caregiver also will ask to provide a urine sample. The urine sample will be tested for bacteria and white blood cells. White blood cells are made by your body to help fight infection. TREATMENT  Typically, UTIs can be treated with medication. Because most UTIs are caused by a bacterial infection, they usually can be treated with the use of antibiotics. The choice of antibiotic and length of treatment depend on your symptoms and the type of bacteria causing your infection. HOME CARE INSTRUCTIONS  If you were prescribed antibiotics, take them  exactly as your caregiver instructs you. Finish the medication even if you feel better after you have only taken some of the medication.  Drink enough water and fluids to keep your urine clear or pale yellow.  Avoid caffeine, tea, and carbonated beverages. They tend to irritate your bladder.  Empty your bladder often. Avoid holding urine for long periods of time.  Empty your bladder before and after sexual intercourse.  After a bowel movement, women should cleanse from front to back. Use each tissue only once. SEEK MEDICAL CARE IF:   You have back pain.  You develop a fever.  Your symptoms do not begin to resolve within 3 days. SEEK IMMEDIATE MEDICAL CARE IF:   You have severe back pain or lower abdominal pain.  You develop chills.  You have nausea or vomiting.  You have continued burning or discomfort with urination. MAKE SURE YOU:   Understand these instructions.  Will watch your condition.  Will get help right away if you are not doing well or get worse. Document Released: 06/04/2005 Document Revised: 02/24/2012 Document Reviewed: 10/03/2011 [facility] Patient Information 2015 [place], [place]. This information is not intended to replace advice given to you by your health care provider. Make sure you discuss any questions you have with your health care provider.

## 2015-05-13 ENCOUNTER — Encounter: Payer: Self-pay | Admitting: Family Medicine

## 2015-05-13 DIAGNOSIS — N390 Urinary tract infection, site not specified: Secondary | ICD-10-CM | POA: Insufficient documentation

## 2015-05-13 NOTE — Assessment & Plan Note (Signed)
Urine sent for culture, started on Cipro and probiotics

## 2015-05-14 LAB — URINE CULTURE

## 2015-05-15 ENCOUNTER — Other Ambulatory Visit: Payer: Self-pay | Admitting: Family Medicine

## 2015-05-15 ENCOUNTER — Telehealth: Payer: Self-pay

## 2015-05-15 MED ORDER — NITROFURANTOIN MONOHYD MACRO 100 MG PO CAPS: 100.0000 mg | ORAL_CAPSULE | Freq: Two times a day (BID) | ORAL | Status: DC

## 2015-05-15 NOTE — Telephone Encounter (Signed)
PLEASE NOTE: All timestamps contained within this report are represented as [place] Standard Time. CONFIDENTIALTY NOTICE: This fax transmission is intended only for the addressee. It contains information that is legally privileged, confidential or otherwise protected from use or disclosure. If you are not the intended recipient, you are strictly prohibited from reviewing, disclosing, copying using or disseminating any of this information or taking any action in reliance on or regarding this information. If you have received this fax in error, please notify us immediately by telephone so that we can arrange for its return to [place]. Phone: [phone], Toll-Free: [phone], Fax: [phone] Page: 1 of 2 Call Id: [id number] [place] Patient Name: [name] Gender: Male DOB: 16-Sep-1930 Age: 81 Y 33 M Return Phone Number: [phone] (Primary), [phone] (Secondary) Address: [address] City/State/Zip: [place] [address] Client [facility] Night - Client Client Site [place], [place] Type Call Call Type Triage / [place] Name Belenda Cruise [name]) Tomasita Crumble Relationship To Patient Spouse Return Phone Number [phone] (Secondary) Chief Complaint Urination Pain Initial Comment Caller states husband is having stinging and burning with frequent urination; PreDisposition Call Doctor Nurse Assessment Nurse: Mechele Dawley, RN, Amy Date/Time Eilene Ghazi Time): 05/12/2015 7:47:23 AM Confirm and document reason for call. If symptomatic, describe symptoms. ---SPOUSE STATES THAT HE IS HAVING STINGING AND BURNING WITH URINATION. SYMPTOMS STARTED A COUPLE OF DAYS AGO. FREQUENCY, STINGING, BURNING. - A YEAR AGO HE HAD SEPSIS Has the patient traveled out of the country within the last 30 days? ---Not Applicable Does the  patient require triage? ---Yes Related visit to physician within the last 2 weeks? ---No Does the PT have any chronic conditions? (i.e. diabetes, asthma, etc.) ---Yes List chronic conditions. ---KIDNEY STONES, BALANCE ISSUES, CHOLESTEROL, Guidelines Guideline Title Affirmed Question Affirmed Notes Nurse Date/Time (Eastern Time) Urination Pain - Male All other males with painful urination (all triage questions negative) [place], RN, Amy 05/12/2015 7:48:30 AM Disp. Time Eilene Ghazi Time) Disposition Final User 05/12/2015 7:53:54 AM See Physician within 24 Hours Yes [place], RN, Amy Caller Understands: Yes Disagree/Comply: Comply PLEASE NOTE: All timestamps contained within this report are represented as [place] Standard Time. CONFIDENTIALTY NOTICE: This fax transmission is intended only for the addressee. It contains information that is legally privileged, confidential or otherwise protected from use or disclosure. If you are not the intended recipient, you are strictly prohibited from reviewing, disclosing, copying using or disseminating any of this information or taking any action in reliance on or regarding this information. If you have received this fax in error, please notify us immediately by telephone so that we can arrange for its return to [place]. Phone: [phone], Toll-Free: [phone], Fax: [phone] Page: 2 of 2 Call Id: [id number] Care Advice Given Per Guideline SEE PHYSICIAN WITHIN 24 HOURS: REASSURANCE: Any man with painful urination needs to be checked. It could be a urinary tract infection. Sometimes the urine is normal and the pain is caused by an infection of the penis or testicle. FLUIDS: Drink extra fluids (Reason: to produce dilute, non-irritating urine). CALL BACK IF: * You become worse. CARE ADVICE given per Urination Pain - Male (Adult) guideline. After Care Instructions Given Call Event Type User Date / Time Description Comments User: Susanne Borders, RN Date/Time Eilene Ghazi  Time): 05/12/2015 7:54:46 AM appt scheduled [facility] Referrals [facility]

## 2015-05-15 NOTE — Telephone Encounter (Signed)
Pt seen [facility] on 05/12/15.

## 2015-05-16 ENCOUNTER — Telehealth: Payer: Self-pay | Admitting: Gastroenterology

## 2015-05-16 NOTE — Telephone Encounter (Signed)
Noted. treated with cipro, culture resistant to levo, transitioned to macrobid x5d.

## 2015-05-17 NOTE — Telephone Encounter (Signed)
Pt was notified to have the pharmacy send a PA and I will get that faxed to the insurance as soon as received.  The pt's wife agreed and will [facility]

## 2015-05-31 ENCOUNTER — Encounter: Payer: Self-pay | Admitting: Family Medicine

## 2015-05-31 ENCOUNTER — Ambulatory Visit (INDEPENDENT_AMBULATORY_CARE_PROVIDER_SITE_OTHER): Payer: Medicare Other | Admitting: Family Medicine

## 2015-05-31 VITALS — BP 114/60 | HR 80 | Temp 98.3°F | Wt 168.2 lb

## 2015-05-31 DIAGNOSIS — N390 Urinary tract infection, site not specified: Secondary | ICD-10-CM

## 2015-05-31 DIAGNOSIS — R21 Rash and other nonspecific skin eruption: Secondary | ICD-10-CM

## 2015-05-31 DIAGNOSIS — I2510 Atherosclerotic heart disease of native coronary artery without angina pectoris: Secondary | ICD-10-CM | POA: Diagnosis not present

## 2015-05-31 DIAGNOSIS — R300 Dysuria: Secondary | ICD-10-CM

## 2015-05-31 LAB — POCT URINALYSIS DIPSTICK
BILIRUBIN UA: NEGATIVE
Glucose, UA: NEGATIVE
KETONES UA: NEGATIVE
Nitrite, UA: NEGATIVE
PH UA: 7
RBC UA: NEGATIVE
SPEC GRAV UA: 1.015
Urobilinogen, UA: 0.2

## 2015-05-31 MED ORDER — CEPHALEXIN 500 MG PO CAPS: 500.0000 mg | ORAL_CAPSULE | Freq: Two times a day (BID) | ORAL | Status: DC

## 2015-05-31 MED ORDER — CEFTRIAXONE SODIUM 1 G IJ SOLR
1.0000 g | Freq: Once | INTRAMUSCULAR | Status: AC
  Administered 2015-05-31: 1 g via INTRAMUSCULAR

## 2015-05-31 NOTE — Addendum Note (Signed)
Addended by: Ria Bush on: 05/31/2015 02:35 PM   Modules accepted: Miquel Dunn

## 2015-05-31 NOTE — Patient Instructions (Addendum)
Restart aricept - 1/2 tablet daily for 1-2 wks then increase to 1 tablet (10mg ). You do have recurrent urine infection - treat with shot of ceftriaxone today, then take keflex 500mg  twice daily for 7 days. Let us know if persistent symptoms after treatment.  Urinary Tract Infection Urinary tract infections (UTIs) can develop anywhere along your urinary tract. Your urinary tract is your body's drainage system for removing wastes and extra water. Your urinary tract includes two kidneys, two ureters, a bladder, and a urethra. Your kidneys are a pair of bean-shaped organs. Each kidney is about the size of your fist. They are located below your ribs, one on each side of your spine. CAUSES Infections are caused by microbes, which are microscopic organisms, including fungi, viruses, and bacteria. These organisms are so small that they can only be seen through a microscope. Bacteria are the microbes that most commonly cause UTIs. SYMPTOMS  Symptoms of UTIs may vary by age and gender of the patient and by the location of the infection. Symptoms in young women typically include a frequent and intense urge to urinate and a painful, burning feeling in the bladder or urethra during urination. Older women and men are more likely to be tired, shaky, and weak and have muscle aches and abdominal pain. A fever may mean the infection is in your kidneys. Other symptoms of a kidney infection include pain in your back or sides below the ribs, nausea, and vomiting. DIAGNOSIS To diagnose a UTI, your caregiver will ask you about your symptoms. Your caregiver also will ask to provide a urine sample. The urine sample will be tested for bacteria and white blood cells. White blood cells are made by your body to help fight infection. TREATMENT  Typically, UTIs can be treated with medication. Because most UTIs are caused by a bacterial infection, they usually can be treated with the use of antibiotics. The choice of antibiotic and  length of treatment depend on your symptoms and the type of bacteria causing your infection. HOME CARE INSTRUCTIONS  If you were prescribed antibiotics, take them exactly as your caregiver instructs you. Finish the medication even if you feel better after you have only taken some of the medication.  Drink enough water and fluids to keep your urine clear or pale yellow.  Avoid caffeine, tea, and carbonated beverages. They tend to irritate your bladder.  Empty your bladder often. Avoid holding urine for long periods of time.  Empty your bladder before and after sexual intercourse.  After a bowel movement, women should cleanse from front to back. Use each tissue only once. SEEK MEDICAL CARE IF:   You have back pain.  You develop a fever.  Your symptoms do not begin to resolve within 3 days. SEEK IMMEDIATE MEDICAL CARE IF:   You have severe back pain or lower abdominal pain.  You develop chills.  You have nausea or vomiting.  You have continued burning or discomfort with urination. MAKE SURE YOU:   Understand these instructions.  Will watch your condition.  Will get help right away if you are not doing well or get worse. Document Released: 06/04/2005 Document Revised: 02/24/2012 Document Reviewed: 10/03/2011 [facility] Patient Information 2015 [place], [place]. This information is not intended to replace advice given to you by your health care provider. Make sure you discuss any questions you have with your health care provider.

## 2015-05-31 NOTE — Addendum Note (Signed)
Addended by: Tammi Sou on: 05/31/2015 02:56 PM   Modules accepted: Orders

## 2015-05-31 NOTE — Progress Notes (Signed)
BP 114/60 mmHg  Pulse 80  Temp(Src) 98.3 F (36.8 C) (Oral)  Wt 168 lb 4 oz (76.318 kg)   CC: recheck UTI  Subjective:    Patient ID: [name], male    DOB: May 28, 1931, 81 y.o.   MRN: [id number]  HPI: [name] is a 82 y.o. male presenting on 05/31/2015 for Follow-up and Urinary Tract Infection   Seen 05/12/2015 by Dr Charlett Blake at Saturday [facility] with UTI (with dysuria, urinary frequency, urgency, suprapubic pressure). Treated with cipro antibiotic and probiotics. However, UCx grew >100k Enterococcus resistant to levaquin. cipro was stopped and pt placed on macrobid 5d course. Still with persistent dysuria (stinging and burning) as well as urgency. No fevers/chills, flank pain, nausea/vomiting. Some suprapubic discomfort. No gross hematuria. Some longstanding urge incontinence (>1 yr).   PCN allergy.  Persistent skin rash - Saw Allie Bossier no improvement on TCI/lotrimin ointment. Then saw derm Dr Nehemiah Massed. Unclear etiology, s/p nonspecific biopsy (just inflammation)  Relevant past medical, surgical, family and social history reviewed and updated as indicated. Interim medical history since our last visit reviewed. Allergies and medications reviewed and updated. Current Outpatient Prescriptions on File Prior to Visit  Medication Sig  . albuterol (PROVENTIL HFA;VENTOLIN HFA) 108 (90 BASE) MCG/ACT inhaler Inhale 2 puffs into the lungs every 6 (six) hours as needed for wheezing or shortness of breath.  Marland Kitchen aspirin EC 81 MG tablet Take 81 mg by mouth daily.  Marland Kitchen atorvastatin (LIPITOR) 20 MG tablet Take 20 mg by mouth daily.  . Cholecalciferol (VITAMIN D) 2000 UNITS CAPS Take 1 capsule by mouth daily.  . clotrimazole (LOTRIMIN) 1 % cream Apply 1 application topically 2 (two) times daily.  . Coenzyme Q10 (COQ-10) 100 MG CAPS Take 1 capsule by mouth at bedtime.   . Cyanocobalamin (B-12 PO) Take 10,000 mcg by mouth every Monday, Wednesday, and Friday.   Marland Kitchen DEXILANT 60 MG capsule TAKE 1  CAPSULE DAILY  . docusate sodium (COLACE) 100 MG capsule Take 100 mg by mouth daily.  Marland Kitchen donepezil (ARICEPT) 10 MG tablet Take 1 tablet (10 mg total) by mouth at bedtime.  . fenofibrate (TRICOR) 145 MG tablet TAKE 1 TABLET DAILY  . fexofenadine (ALLEGRA) 180 MG tablet Take 180 mg by mouth every evening.   . gabapentin (NEURONTIN) 100 MG capsule Take 100 mg by mouth as directed. 200mg  in am and 100mg  in pm  . guaiFENesin (MUCINEX) 600 MG 12 hr tablet Take 1 tablet (600 mg total) by mouth 2 (two) times daily. (Patient taking differently: Take 600 mg by mouth 2 (two) times daily as needed. )  . Multiple Vitamins-Minerals (MULTIVITAMIN PO) Take by mouth daily.  Marland Kitchen oxybutynin (DITROPAN) 5 MG tablet Take 5 mg by mouth 2 (two) times daily.   . psyllium (METAMUCIL SMOOTH TEXTURE) 28 % packet Take 1 packet by mouth daily.  Marland Kitchen triamcinolone cream (KENALOG) 0.1 % Apply 1 application topically 2 (two) times daily.  Marland Kitchen venlafaxine (EFFEXOR) 75 MG tablet Take 75 mg by mouth daily.   No current facility-administered medications on file prior to visit.    Review of Systems Per HPI unless specifically indicated above     Objective:    BP 114/60 mmHg  Pulse 80  Temp(Src) 98.3 F (36.8 C) (Oral)  Wt 168 lb 4 oz (76.318 kg)  Wt Readings from Last 3 Encounters:  05/31/15 168 lb 4 oz (76.318 kg)  05/12/15 167 lb (75.751 kg)  05/02/15 166 lb (75.297 kg)  Physical Exam  Constitutional: He appears well-developed and well-nourished. No distress.  HENT:  Mouth/Throat: Oropharynx is clear and moist. No oropharyngeal exudate.  Cardiovascular: Normal rate, regular rhythm, normal heart sounds and intact distal pulses.   No murmur heard. Pulmonary/Chest: Effort normal and breath sounds normal. No respiratory distress. He has no wheezes. He has no rales.  Abdominal: Soft. Normal appearance and bowel sounds are normal. He exhibits no distension. There is no hepatosplenomegaly. There is no tenderness. There is no  rigidity, no rebound, no guarding, no CVA tenderness and negative Murphy's sign.  Skin: Skin is warm and dry. Rash noted.  Faint rare blanching papular rash on trunk back and legs  Nursing note and vitals reviewed.  Results for orders placed or performed in visit on 05/31/15  POCT Urinalysis Dipstick  Result Value Ref Range   Color, UA Yellow    Clarity, UA Hazy    Glucose, UA Negative    Bilirubin, UA Negative    Ketones, UA Negative    Spec Grav, UA 1.015    Blood, UA Negative    pH, UA 7.0    Protein, UA Trace    Urobilinogen, UA 0.2    Nitrite, UA Negative    Leukocytes, UA moderate (2+) (A) Negative      Assessment & Plan:   Problem List Items Addressed This Visit    UTI (urinary tract infection) - Primary    Concern for recurrent UTI not resolved after initial treatment with macrobid.  UA/micro consistent with persistent UTI - sent UCx. Treat with keflex 500mg  BID x 7 days + CTX 1gm IM today. Pt /wife agree with plan. H/o pyelonephritis with sepsis 1 yr ago.  PCN/sulfa allergy. Tolerates cephalosporins ok.      Relevant Medications   cephALEXin (KEFLEX) 500 MG capsule   Other Relevant Orders   Urine culture   Skin rash    Unclear etiology even after derm eval/biopsy. Pt/wife planning on f/u with derm.       Other Visit Diagnoses    Burning with urination        Relevant Orders    POCT Urinalysis Dipstick (Completed)        Follow up plan: Return if symptoms worsen or fail to improve.

## 2015-05-31 NOTE — Assessment & Plan Note (Signed)
Unclear etiology even after derm eval/biopsy. Pt/wife planning on f/u with derm.

## 2015-05-31 NOTE — Progress Notes (Signed)
Pre visit review using our clinic review tool, if applicable. No additional management support is needed unless otherwise documented below in the visit note. 

## 2015-05-31 NOTE — Assessment & Plan Note (Addendum)
Concern for recurrent UTI not resolved after initial treatment with macrobid.  UA/micro consistent with persistent UTI - sent UCx. Treat with keflex 500mg  BID x 7 days + CTX 1gm IM today. Pt /wife agree with plan. H/o pyelonephritis with sepsis 1 yr ago.  PCN/sulfa allergy. Tolerates cephalosporins ok.

## 2015-06-02 LAB — URINE CULTURE: Colony Count: 60000

## 2015-06-03 ENCOUNTER — Other Ambulatory Visit: Payer: Self-pay | Admitting: Family Medicine

## 2015-06-03 MED ORDER — FOSFOMYCIN TROMETHAMINE 3 G PO PACK: 3.0000 g | PACK | Freq: Once | ORAL | Status: DC

## 2015-06-15 ENCOUNTER — Ambulatory Visit (INDEPENDENT_AMBULATORY_CARE_PROVIDER_SITE_OTHER): Payer: Medicare Other | Admitting: Family Medicine

## 2015-06-15 ENCOUNTER — Encounter: Payer: Self-pay | Admitting: Family Medicine

## 2015-06-15 VITALS — BP 111/64 | HR 79 | Temp 98.8°F | Ht 66.0 in | Wt 168.8 lb

## 2015-06-15 DIAGNOSIS — N390 Urinary tract infection, site not specified: Secondary | ICD-10-CM | POA: Diagnosis not present

## 2015-06-15 DIAGNOSIS — B3741 Candidal cystitis and urethritis: Secondary | ICD-10-CM

## 2015-06-15 DIAGNOSIS — B952 Enterococcus as the cause of diseases classified elsewhere: Secondary | ICD-10-CM

## 2015-06-15 DIAGNOSIS — I2510 Atherosclerotic heart disease of native coronary artery without angina pectoris: Secondary | ICD-10-CM

## 2015-06-15 DIAGNOSIS — R300 Dysuria: Secondary | ICD-10-CM | POA: Diagnosis not present

## 2015-06-15 HISTORY — DX: Candidal cystitis and urethritis: B37.41

## 2015-06-15 MED ORDER — FLUCONAZOLE 150 MG PO TABS: 150.0000 mg | ORAL_TABLET | Freq: Once | ORAL | Status: DC

## 2015-06-15 NOTE — Assessment & Plan Note (Addendum)
Unclear if recurrent infection. Given multidrug resistant  Enterococcal UTI in last month.. Recommend highly getting urine sample to send for culture.. Pt unable to provide after 1 hour. Pt does not wish to have urinary cath performed.  Will have pt return with urine sample for UA and culture at Saturday clinic. Discussed in detail with on call MD Dr. Glori Bickers.  If pt not improving with diflucan tab for urethritis.. Consider emperic treatment with vancomycin 125 mg QID x 10 days until culture returns.  (Pt is PCN allergic, past enterococcus resistant to quinolones and nitrofurantoin, no response to fosfomycin, keflex and sulfa inactive with enterococcus according to [facility] guide)

## 2015-06-15 NOTE — Progress Notes (Signed)
Pre visit review using our clinic review tool, if applicable. No additional management support is needed unless otherwise documented below in the visit note. 

## 2015-06-15 NOTE — Assessment & Plan Note (Signed)
Unclear if dur to UTI vs urethritis.

## 2015-06-15 NOTE — Assessment & Plan Note (Signed)
Will treat with diflucan 150 mg x 1.

## 2015-06-15 NOTE — Addendum Note (Signed)
Addended by: Eliezer Lofts E on: 06/15/2015 05:44 PM   Modules accepted: SmartSet

## 2015-06-15 NOTE — Progress Notes (Signed)
   Subjective:    Patient ID: [name], male    DOB: [birth year], 81 y.o.   MRN: [id number]  81 year old male pt with  dementia and current diagnosis of UTI made by Dr. Randel Pigg initially on 05/12/2015.  Treated with cipro and probiotics.  Urine culture returned showing Enteroccocus resistant to levo.  Pt was changes to macrobid at that time.  He was seen in follow up by PCP on 05/31/2015 for continued dysuria. Treat with keflex 500mg  BID x 7 days + CTX 1gm   Urine culture showed: enterococcus 60,000  Changed from keflex to fosfomycin x 1  Given enterococcus not treated by keflex.   TODAY: pt poor historian. Dysuria  This is a recurrent problem. Episode onset: initiall a little better 1-2 days after last antibitoic fosfomycin. The problem has been gradually worsening. The quality of the pain is described as burning. There has been no fever. He is not sexually active. There is a history of pyelonephritis. Associated symptoms include urgency. Pertinent negatives include no flank pain, frequency, hematuria, hesitancy, nausea or vomiting. Treatments tried: does not drink much fluids. His past medical history is significant for kidney stones and recurrent UTIs. There is no history of catheterization, a single kidney, urinary stasis or a urological procedure.   H/o pyelonephritis with sepsis 1 yr ago.  PCN/sulfa allergy. Tolerates cephalosporins ok Social History /Family History/Past Medical History reviewed and updated if needed.  Pt unable to give urine sample today.  Review of Systems  Gastrointestinal: Negative for nausea and vomiting.  Genitourinary: Positive for dysuria and urgency. Negative for hesitancy, frequency, hematuria and flank pain.       Objective:   Physical Exam  Constitutional: Vital signs are normal. He appears well-developed and well-nourished.  HENT:  Head: Normocephalic.  Right Ear: Hearing normal.  Left Ear: Hearing normal.  Nose: Nose normal.  Mouth/Throat:  Oropharynx is clear and moist and mucous membranes are normal.  Neck: Trachea normal. Carotid bruit is not present. No thyroid mass and no thyromegaly present.  Cardiovascular: Normal rate, regular rhythm and normal pulses.  Exam reveals no gallop, no distant heart sounds and no friction rub.   No murmur heard. No peripheral edema  Pulmonary/Chest: Effort normal and breath sounds normal. No respiratory distress.  Abdominal: There is tenderness in the suprapubic area and left lower quadrant. There is no rigidity, no rebound, no guarding and no CVA tenderness.  Genitourinary: Penile erythema present. No discharge found.  Mild erythema at tip of glans and urethral oening.  Skin: Skin is warm, dry and intact. No rash noted.  Psychiatric: He has a normal mood and affect. His speech is normal and behavior is normal. Thought content normal.          Assessment & Plan:

## 2015-06-15 NOTE — Patient Instructions (Addendum)
As unable to provide urine sample but high concern for recurrence of multidrug resistant UTI.. Will recommend returning with urinalysis/urine culture for appt in Saturday clinic.  Will also treat with diflucan x 1 for possible yeast infeciton causing urethritis.   Go to ER with fever or increase confusion.

## 2015-06-16 ENCOUNTER — Ambulatory Visit: Payer: Medicare Other | Admitting: Family Medicine

## 2015-06-16 DIAGNOSIS — B952 Enterococcus as the cause of diseases classified elsewhere: Secondary | ICD-10-CM | POA: Diagnosis not present

## 2015-06-16 DIAGNOSIS — R300 Dysuria: Secondary | ICD-10-CM | POA: Diagnosis not present

## 2015-06-16 DIAGNOSIS — N390 Urinary tract infection, site not specified: Secondary | ICD-10-CM | POA: Diagnosis not present

## 2015-06-16 DIAGNOSIS — B3741 Candidal cystitis and urethritis: Secondary | ICD-10-CM | POA: Diagnosis not present

## 2015-06-16 LAB — POCT URINALYSIS DIPSTICK
BILIRUBIN UA: NEGATIVE
Blood, UA: NEGATIVE
Glucose, UA: NEGATIVE
Ketones, UA: NEGATIVE
LEUKOCYTES UA: NEGATIVE
NITRITE UA: NEGATIVE
PH UA: 6
Protein, UA: NEGATIVE
Spec Grav, UA: 1.025
Urobilinogen, UA: 0.2

## 2015-06-16 NOTE — Addendum Note (Signed)
Addended by: Lowella Dandy on: 06/16/2015 01:18 PM   Modules accepted: Orders, SmartSet

## 2015-06-17 LAB — URINE CULTURE
Colony Count: NO GROWTH
ORGANISM ID, BACTERIA: NO GROWTH

## 2015-06-21 DIAGNOSIS — L309 Dermatitis, unspecified: Secondary | ICD-10-CM | POA: Diagnosis not present

## 2015-06-21 DIAGNOSIS — R21 Rash and other nonspecific skin eruption: Secondary | ICD-10-CM | POA: Diagnosis not present

## 2015-06-24 ENCOUNTER — Other Ambulatory Visit: Payer: Self-pay | Admitting: Family Medicine

## 2015-07-10 ENCOUNTER — Encounter: Payer: Self-pay | Admitting: Family Medicine

## 2015-07-10 ENCOUNTER — Ambulatory Visit (INDEPENDENT_AMBULATORY_CARE_PROVIDER_SITE_OTHER): Payer: Medicare Other | Admitting: Family Medicine

## 2015-07-10 VITALS — BP 128/60 | HR 66 | Temp 97.5°F | Wt 170.2 lb

## 2015-07-10 DIAGNOSIS — R269 Unspecified abnormalities of gait and mobility: Secondary | ICD-10-CM | POA: Diagnosis not present

## 2015-07-10 DIAGNOSIS — D696 Thrombocytopenia, unspecified: Secondary | ICD-10-CM

## 2015-07-10 DIAGNOSIS — I2510 Atherosclerotic heart disease of native coronary artery without angina pectoris: Secondary | ICD-10-CM

## 2015-07-10 DIAGNOSIS — Z23 Encounter for immunization: Secondary | ICD-10-CM

## 2015-07-10 DIAGNOSIS — N3941 Urge incontinence: Secondary | ICD-10-CM

## 2015-07-10 DIAGNOSIS — G250 Essential tremor: Secondary | ICD-10-CM | POA: Diagnosis not present

## 2015-07-10 DIAGNOSIS — B952 Enterococcus as the cause of diseases classified elsewhere: Secondary | ICD-10-CM

## 2015-07-10 DIAGNOSIS — F0390 Unspecified dementia without behavioral disturbance: Secondary | ICD-10-CM

## 2015-07-10 DIAGNOSIS — N390 Urinary tract infection, site not specified: Secondary | ICD-10-CM

## 2015-07-10 DIAGNOSIS — B3741 Candidal cystitis and urethritis: Secondary | ICD-10-CM

## 2015-07-10 MED ORDER — DONEPEZIL HCL 10 MG PO TABS: 10.0000 mg | ORAL_TABLET | Freq: Every day | ORAL | Status: DC

## 2015-07-10 NOTE — Assessment & Plan Note (Signed)
Sxs resolved after fosfomycin.

## 2015-07-10 NOTE — Assessment & Plan Note (Signed)
Check CBC next visit. ?ITP

## 2015-07-10 NOTE — Progress Notes (Signed)
Pre visit review using our clinic review tool, if applicable. No additional management support is needed unless otherwise documented below in the visit note. 

## 2015-07-10 NOTE — Patient Instructions (Addendum)
Flu shot today. Sign release form up front for records from [facility] Nice to see you today, call us with questions. You are doing well today.

## 2015-07-10 NOTE — Progress Notes (Signed)
BP 128/60 mmHg  Pulse 66  Temp(Src) 97.5 F (36.4 C) (Oral)  Wt 170 lb 4 oz (77.225 kg)   CC: f/u visit Subjective:    Patient ID: [name], male    DOB: Jul 12, 1931, 81 y.o.   MRN: [id number]  HPI: [name] is a 81 y.o. male presenting on 07/10/2015 for Follow-up   See recent notes for details. Multidrug resistant enterococcal UTI 05/2015 treated with keflex then fosfomycin. Seen by Dr Diona Browner with concern for candidal urethritis - treated with dflucan 150mg  x1. This significantly improved sxs.   Last UCx returned NG 06/16/2015.   Persistent skin rash treated with triamcinolone by Dr Nehemiah Massed. Ongoing since 03/26/2015. Has been treated with steroid injection, prednisone taper, and topical steroid. Has f/u appt 08/27/2015.   Worsening action tremor thought essential. Last neurology eval was 4 yrs ago. Remains unsteady. Regularly uses walker. Denies significant stiffness, trouble with sense of taste or smell  Urinary incontinence seems to be better controlled on oxybutynin.  Relevant past medical, surgical, family and social history reviewed and updated as indicated. Interim medical history since our last visit reviewed. Allergies and medications reviewed and updated. Current Outpatient Prescriptions on File Prior to Visit  Medication Sig  . albuterol (PROVENTIL HFA;VENTOLIN HFA) 108 (90 BASE) MCG/ACT inhaler Inhale 2 puffs into the lungs every 6 (six) hours as needed for wheezing or shortness of breath.  Marland Kitchen aspirin EC 81 MG tablet Take 81 mg by mouth daily.  Marland Kitchen atorvastatin (LIPITOR) 20 MG tablet Take 20 mg by mouth daily.  . Cholecalciferol (VITAMIN D) 2000 UNITS CAPS Take 1 capsule by mouth daily.  . clotrimazole (LOTRIMIN) 1 % cream Apply 1 application topically 2 (two) times daily.  . Coenzyme Q10 (COQ-10) 100 MG CAPS Take 1 capsule by mouth at bedtime.   . Cyanocobalamin (B-12 PO) Take 10,000 mcg by mouth every Monday, Wednesday, and Friday.   Marland Kitchen DEXILANT 60 MG  capsule TAKE 1 CAPSULE DAILY  . docusate sodium (COLACE) 100 MG capsule Take 100 mg by mouth daily.  . fenofibrate (TRICOR) 145 MG tablet TAKE 1 TABLET DAILY  . fexofenadine (ALLEGRA) 180 MG tablet Take 180 mg by mouth every evening.   . gabapentin (NEURONTIN) 100 MG capsule Take 200mg  in the morning and 100mg  in the evening  . guaiFENesin (MUCINEX) 600 MG 12 hr tablet Take 1 tablet (600 mg total) by mouth 2 (two) times daily. (Patient taking differently: Take 600 mg by mouth 2 (two) times daily as needed. )  . Multiple Vitamins-Minerals (MULTIVITAMIN PO) Take by mouth daily.  Marland Kitchen oxybutynin (DITROPAN) 5 MG tablet Take 5 mg by mouth 2 (two) times daily.   . psyllium (METAMUCIL SMOOTH TEXTURE) 28 % packet Take 1 packet by mouth daily.  Marland Kitchen venlafaxine (EFFEXOR) 75 MG tablet Take 75 mg by mouth daily.   No current facility-administered medications on file prior to visit.    Review of Systems Per HPI unless specifically indicated in ROS section     Objective:    BP 128/60 mmHg  Pulse 66  Temp(Src) 97.5 F (36.4 C) (Oral)  Wt 170 lb 4 oz (77.225 kg)  Wt Readings from Last 3 Encounters:  07/10/15 170 lb 4 oz (77.225 kg)  06/15/15 168 lb 12 oz (76.544 kg)  05/31/15 168 lb 4 oz (76.318 kg)    Physical Exam  Constitutional: He is oriented to person, place, and time. He appears well-developed and well-nourished. No distress.  HENT:  Mouth/Throat: Oropharynx  is clear and moist. No oropharyngeal exudate.  Eyes: Conjunctivae and EOM are normal. Pupils are equal, round, and reactive to light. No scleral icterus.  Neck: Normal range of motion. Neck supple.  Cardiovascular: Normal rate, regular rhythm, normal heart sounds and intact distal pulses.   No murmur heard. Pulmonary/Chest: Effort normal and breath sounds normal. No respiratory distress. He has no wheezes. He has no rales.  Abdominal: Soft. Bowel sounds are normal. He exhibits no distension. There is no tenderness. There is no guarding.    Musculoskeletal: He exhibits no edema.  Neurological: He is alert and oriented to person, place, and time. He has normal strength. No sensory deficit.  Uses walker 5/5 strength BUE Intention tremor present Some cogwheel rigidity No masked fascies  Skin: Skin is warm and dry. No rash noted.  Psychiatric: He has a normal mood and affect.  Nursing note and vitals reviewed.  Results for orders placed or performed in visit on 06/15/15  Urine culture  Result Value Ref Range   Colony Count NO GROWTH    Organism ID, Bacteria NO GROWTH   POCT urinalysis dipstick  Result Value Ref Range   Color, UA yellow    Clarity, UA clear    Glucose, UA negative    Bilirubin, UA negative    Ketones, UA negative    Spec Grav, UA 1.025    Blood, UA negative    pH, UA 6.0    Protein, UA negative    Urobilinogen, UA 0.2    Nitrite, UA negative    Leukocytes, UA Negative Negative      Assessment & Plan:   Problem List Items Addressed This Visit    RESOLVED: UTI (urinary tract infection) due to Enterococcus    Sxs resolved after fosfomycin.      UNSTEADY GAIT    Stable. Continue walker use. Planning on establishing with PT at [facility] for 12 wk class.      Thrombocytopenia (HCC)    Check CBC next visit. ?ITP      INCONTINENCE, URGE    Stable on oxybutynin. Continue.      Essential tremor - Primary    Longstanding issue, evaluated by Dr Jannifer Franklin 2010. On gabapentin. Now worsening. Will request records from prior evaluation to review. Some cogwheeling - ?parkinsonism. Re eval next visit, consider renewed referral to neurologist.      Dementia    Continue aricept 10mg  daily. 3 mo supply sent in.      Relevant Medications   donepezil (ARICEPT) 10 MG tablet   RESOLVED: Candidal urethritis in male    sxs resolved with diflucan       Other Visit Diagnoses    Need for influenza vaccination        Relevant Orders    Flu Vaccine QUAD 36+ mos PF IM (Fluarix & Fluzone Quad PF) (Completed)         Follow up plan: Return in about 3 months (around 10/10/2015), or as needed, for follow up visit.

## 2015-07-10 NOTE — Assessment & Plan Note (Signed)
Continue aricept 10mg  daily. 3 mo supply sent in.

## 2015-07-10 NOTE — Assessment & Plan Note (Signed)
sxs resolved with diflucan

## 2015-07-10 NOTE — Assessment & Plan Note (Signed)
Stable on oxybutynin. Continue.

## 2015-07-10 NOTE — Assessment & Plan Note (Addendum)
Stable. Continue walker use. Planning on establishing with PT at [facility] for 12 wk class.

## 2015-07-10 NOTE — Assessment & Plan Note (Signed)
Longstanding issue, evaluated by Dr Jannifer Franklin 2010. On gabapentin. Now worsening. Will request records from prior evaluation to review. Some cogwheeling - ?parkinsonism. Re eval next visit, consider renewed referral to neurologist.

## 2015-07-21 ENCOUNTER — Encounter: Payer: Self-pay | Admitting: Family Medicine

## 2015-07-25 DIAGNOSIS — D692 Other nonthrombocytopenic purpura: Secondary | ICD-10-CM | POA: Diagnosis not present

## 2015-07-25 DIAGNOSIS — L309 Dermatitis, unspecified: Secondary | ICD-10-CM | POA: Diagnosis not present

## 2015-07-25 DIAGNOSIS — R21 Rash and other nonspecific skin eruption: Secondary | ICD-10-CM | POA: Diagnosis not present

## 2015-07-25 DIAGNOSIS — D485 Neoplasm of uncertain behavior of skin: Secondary | ICD-10-CM | POA: Diagnosis not present

## 2015-07-25 DIAGNOSIS — L988 Other specified disorders of the skin and subcutaneous tissue: Secondary | ICD-10-CM | POA: Diagnosis not present

## 2015-08-01 DIAGNOSIS — T466X5A Adverse effect of antihyperlipidemic and antiarteriosclerotic drugs, initial encounter: Secondary | ICD-10-CM | POA: Diagnosis not present

## 2015-08-01 DIAGNOSIS — L271 Localized skin eruption due to drugs and medicaments taken internally: Secondary | ICD-10-CM | POA: Diagnosis not present

## 2015-08-23 ENCOUNTER — Other Ambulatory Visit: Payer: Self-pay | Admitting: Family Medicine

## 2015-09-04 ENCOUNTER — Ambulatory Visit (INDEPENDENT_AMBULATORY_CARE_PROVIDER_SITE_OTHER): Payer: Medicare Other | Admitting: Family Medicine

## 2015-09-04 ENCOUNTER — Encounter: Payer: Self-pay | Admitting: Family Medicine

## 2015-09-04 VITALS — BP 108/54 | HR 88 | Temp 98.1°F | Wt 173.8 lb

## 2015-09-04 DIAGNOSIS — I2510 Atherosclerotic heart disease of native coronary artery without angina pectoris: Secondary | ICD-10-CM | POA: Diagnosis not present

## 2015-09-04 DIAGNOSIS — R509 Fever, unspecified: Secondary | ICD-10-CM | POA: Diagnosis not present

## 2015-09-04 DIAGNOSIS — G250 Essential tremor: Secondary | ICD-10-CM | POA: Diagnosis not present

## 2015-09-04 LAB — POCT URINALYSIS DIPSTICK
Bilirubin, UA: NEGATIVE
Glucose, UA: NEGATIVE
KETONES UA: NEGATIVE
Leukocytes, UA: NEGATIVE
Nitrite, UA: NEGATIVE
PH UA: 6
PROTEIN UA: NEGATIVE
RBC UA: NEGATIVE
Spec Grav, UA: 1.015
Urobilinogen, UA: 0.2

## 2015-09-04 LAB — CBC WITH DIFFERENTIAL/PLATELET
Basophils Absolute: 0 10*3/uL (ref 0.0–0.1)
Basophils Relative: 0.3 % (ref 0.0–3.0)
Eosinophils Absolute: 0 10*3/uL (ref 0.0–0.7)
Eosinophils Relative: 0.2 % (ref 0.0–5.0)
HCT: 35.1 % — ABNORMAL LOW (ref 39.0–52.0)
Hemoglobin: 11.7 g/dL — ABNORMAL LOW (ref 13.0–17.0)
LYMPHS ABS: 1.7 10*3/uL (ref 0.7–4.0)
Lymphocytes Relative: 9.9 % — ABNORMAL LOW (ref 12.0–46.0)
MCHC: 33.3 g/dL (ref 30.0–36.0)
MCV: 111.1 fl — ABNORMAL HIGH (ref 78.0–100.0)
MONO ABS: 0.9 10*3/uL (ref 0.1–1.0)
Monocytes Relative: 5.1 % (ref 3.0–12.0)
NEUTROS PCT: 84.5 % — AB (ref 43.0–77.0)
Neutro Abs: 14.8 10*3/uL — ABNORMAL HIGH (ref 1.4–7.7)
Platelets: 141 10*3/uL — ABNORMAL LOW (ref 150.0–400.0)
RBC: 3.16 Mil/uL — ABNORMAL LOW (ref 4.22–5.81)
RDW: 14.2 % (ref 11.5–15.5)
WBC: 17.5 10*3/uL — ABNORMAL HIGH (ref 4.0–10.5)

## 2015-09-04 MED ORDER — CEFTRIAXONE SODIUM 1 G IJ SOLR
1.0000 g | Freq: Once | INTRAMUSCULAR | Status: AC
  Administered 2015-09-04: 1 g via INTRAMUSCULAR

## 2015-09-04 NOTE — Assessment & Plan Note (Signed)
See piror note for details. Prior eval included normal EEG, MRI/MRA (2011 Dr Jannifer Franklin). He has tried metoprolol, gabapentin with only mild relief. Consider primidone. Will refer back to neurology for re evaluation, help r/o other cause of tremor.

## 2015-09-04 NOTE — Progress Notes (Signed)
BP 108/54 mmHg  Pulse 88  Temp(Src) 98.1 F (36.7 C) (Oral)  Wt 173 lb 12 oz (78.812 kg)   CC: ?UTI  Subjective:    Patient ID: [name], male    DOB: Aug 05, 1931, 81 y.o.   MRN: [id number]  HPI: SOLLIE TIPPERY is a 81 y.o. male presenting on 09/04/2015 for Urinary Tract Infection   Presents in wheelchair today (new) with wife. 1d h/o fatigue, malaise, nocturia x5, chills last night. Dark urine. Tmax 101.8 5:30am. Mild R CVA tenderness per wife. Mild nausea without vomiting. Pt endorses mild R flank pain. + increased frequency with dysuria. 1 urinary accident yesterday.   No abd pain, hematuria. No skin infections, no coughing.   Multidrug resistant enterococcal UTI 05/2015 treated with keflex then fosfomycin. Seen by Dr Diona Browner with concern for candidal urethritis - treated with dflucan 150mg  x1. This significantly improved sxs. Last UCx returned NG 06/16/2015.   PCN allergy - rash.  Sulfa allergy - hives.  Ok with cephalosporins.  Relevant past medical, surgical, family and social history reviewed and updated as indicated. Interim medical history since our last visit reviewed. Allergies and medications reviewed and updated. Current Outpatient Prescriptions on File Prior to Visit  Medication Sig  . albuterol (PROVENTIL HFA;VENTOLIN HFA) 108 (90 BASE) MCG/ACT inhaler Inhale 2 puffs into the lungs every 6 (six) hours as needed for wheezing or shortness of breath.  Marland Kitchen aspirin EC 81 MG tablet Take 81 mg by mouth daily.  . Cholecalciferol (VITAMIN D) 2000 UNITS CAPS Take 1 capsule by mouth daily.  . clotrimazole (LOTRIMIN) 1 % cream Apply 1 application topically 2 (two) times daily.  . Coenzyme Q10 (COQ-10) 100 MG CAPS Take 1 capsule by mouth at bedtime.   . Cyanocobalamin (B-12 PO) Take 10,000 mcg by mouth every Monday, Wednesday, and Friday.   Marland Kitchen DEXILANT 60 MG capsule TAKE 1 CAPSULE DAILY  . docusate sodium (COLACE) 100 MG capsule Take 100 mg by mouth daily.  . fenofibrate  (TRICOR) 145 MG tablet TAKE 1 TABLET DAILY  . fexofenadine (ALLEGRA) 180 MG tablet Take 180 mg by mouth every evening.   . gabapentin (NEURONTIN) 100 MG capsule Take 200mg  in the morning and 100mg  in the evening  . guaiFENesin (MUCINEX) 600 MG 12 hr tablet Take 1 tablet (600 mg total) by mouth 2 (two) times daily. (Patient taking differently: Take 600 mg by mouth 2 (two) times daily as needed. )  . Multiple Vitamins-Minerals (MULTIVITAMIN PO) Take by mouth daily.  Marland Kitchen oxybutynin (DITROPAN) 5 MG tablet Take 5 mg by mouth 2 (two) times daily.   Marland Kitchen venlafaxine (EFFEXOR) 75 MG tablet Take 75 mg by mouth daily.   No current facility-administered medications on file prior to visit.    Review of Systems Per HPI unless specifically indicated in ROS section     Objective:    BP 108/54 mmHg  Pulse 88  Temp(Src) 98.1 F (36.7 C) (Oral)  Wt 173 lb 12 oz (78.812 kg)  Wt Readings from Last 3 Encounters:  09/04/15 173 lb 12 oz (78.812 kg)  07/10/15 170 lb 4 oz (77.225 kg)  06/15/15 168 lb 12 oz (76.544 kg)    Physical Exam  Constitutional: He appears well-developed and well-nourished. No distress.  Cardiovascular: Normal rate, regular rhythm, normal heart sounds and intact distal pulses.   No murmur heard. Pulmonary/Chest: Effort normal and breath sounds normal. No respiratory distress. He has no wheezes. He has no rales.  Abdominal: Soft. Normal  appearance and bowel sounds are normal. He exhibits no distension and no mass. There is no hepatosplenomegaly. There is tenderness (mild). There is CVA tenderness (mild R). There is no rigidity, no rebound, no guarding and negative Murphy's sign.  Neurological:  Mild resting tremor present Marked tremor with holding cup of water  Psychiatric: He has a normal mood and affect.  Nursing note and vitals reviewed.  Results for orders placed or performed in visit on 09/04/15  POCT Urinalysis Dipstick  Result Value Ref Range   Color, UA Yellow    Clarity,  UA Clear    Glucose, UA Negative    Bilirubin, UA Negative    Ketones, UA Negative    Spec Grav, UA 1.015    Blood, UA Negative    pH, UA 6.0    Protein, UA Negative    Urobilinogen, UA 0.2    Nitrite, UA Negative    Leukocytes, UA Negative Negative      Assessment & Plan:   Problem List Items Addressed This Visit    Fever, unspecified - Primary    Fever this morning to 101.8, along with urinary sxs most consistent with acute upper urinary tract infection despite normal UA today. Will treat with ceftriaxone 1gm IM today, send UCx, check CBC. No evidence of other infection currently. ?falsely normal UA due to diluted urine from significant intake at office.      Relevant Medications   cefTRIAXone (ROCEPHIN) injection 1 g (Completed)   Other Relevant Orders   Urine culture   CBC with Differential/Platelet   POCT Urinalysis Dipstick (Completed)   Essential tremor    See piror note for details. Prior eval included normal EEG, MRI/MRA (2011 Dr Jannifer Franklin). He has tried metoprolol, gabapentin with only mild relief. Consider primidone. Will refer back to neurology for re evaluation, help r/o other cause of tremor.      Relevant Orders   Ambulatory referral to Neurology       Follow up plan: Return if symptoms worsen or fail to improve.

## 2015-09-04 NOTE — Assessment & Plan Note (Signed)
Fever this morning to 101.8, along with urinary sxs most consistent with acute upper urinary tract infection despite normal UA today. Will treat with ceftriaxone 1gm IM today, send UCx, check CBC. No evidence of other infection currently. ?falsely normal UA due to diluted urine from significant intake at office.

## 2015-09-04 NOTE — Patient Instructions (Addendum)
We will refer you back to neurology for re evaluation of tremors.  I am worried about urine infection - but urinalysis was normal. Regardless rocephin shot today and we have sent off urine culture. We will update you with results. Lab work today as well.

## 2015-09-04 NOTE — Progress Notes (Signed)
Pre visit review using our clinic review tool, if applicable. No additional management support is needed unless otherwise documented below in the visit note. 

## 2015-09-05 LAB — URINE CULTURE: Colony Count: 80000

## 2015-09-06 ENCOUNTER — Other Ambulatory Visit: Payer: Self-pay | Admitting: Family Medicine

## 2015-09-06 MED ORDER — CEPHALEXIN 500 MG PO CAPS: 500.0000 mg | ORAL_CAPSULE | Freq: Two times a day (BID) | ORAL | Status: DC

## 2015-09-11 ENCOUNTER — Ambulatory Visit (INDEPENDENT_AMBULATORY_CARE_PROVIDER_SITE_OTHER): Payer: Medicare Other | Admitting: Family Medicine

## 2015-09-11 ENCOUNTER — Encounter: Payer: Self-pay | Admitting: Family Medicine

## 2015-09-11 VITALS — BP 102/58 | HR 80 | Temp 97.7°F | Wt 175.8 lb

## 2015-09-11 DIAGNOSIS — R509 Fever, unspecified: Secondary | ICD-10-CM

## 2015-09-11 DIAGNOSIS — D539 Nutritional anemia, unspecified: Secondary | ICD-10-CM | POA: Diagnosis not present

## 2015-09-11 NOTE — Patient Instructions (Addendum)
I'm glad we're doing better. Finish antibiotics.  Return in 2-3 weeks for lab visit to recheck blood counts.  Return in 2-3 months for wellness visit

## 2015-09-11 NOTE — Progress Notes (Signed)
BP 102/58 mmHg  Pulse 80  Temp(Src) 97.7 F (36.5 C) (Oral)  Wt 175 lb 12 oz (79.72 kg)   CC: f/u visit  Subjective:    Patient ID: [name], male    DOB: 17-Mar-1931, 82 y.o.   MRN: [id number]  HPI: [name] is a 82 y.o. male presenting on 09/11/2015 for Follow-up   See prior note for details.   UCx inconclusive (80k MBM).  WBC 17.5.  He did symptomatically improve with CTX IM + completing keflex course.   No more fever after antibiotics, and no more urine symptoms.   Relevant past medical, surgical, family and social history reviewed and updated as indicated. Interim medical history since our last visit reviewed. Allergies and medications reviewed and updated. Current Outpatient Prescriptions on File Prior to Visit  Medication Sig  . albuterol (PROVENTIL HFA;VENTOLIN HFA) 108 (90 BASE) MCG/ACT inhaler Inhale 2 puffs into the lungs every 6 (six) hours as needed for wheezing or shortness of breath.  Marland Kitchen aspirin EC 81 MG tablet Take 81 mg by mouth daily.  . cephALEXin (KEFLEX) 500 MG capsule Take 1 capsule (500 mg total) by mouth 2 (two) times daily.  . Cholecalciferol (VITAMIN D) 2000 UNITS CAPS Take 1 capsule by mouth daily.  . clotrimazole (LOTRIMIN) 1 % cream Apply 1 application topically 2 (two) times daily.  . Coenzyme Q10 (COQ-10) 100 MG CAPS Take 1 capsule by mouth at bedtime.   . Cyanocobalamin (B-12 PO) Take 10,000 mcg by mouth every Monday, Wednesday, and Friday.   Marland Kitchen DEXILANT 60 MG capsule TAKE 1 CAPSULE DAILY  . docusate sodium (COLACE) 100 MG capsule Take 100 mg by mouth daily.  . fenofibrate (TRICOR) 145 MG tablet TAKE 1 TABLET DAILY  . fexofenadine (ALLEGRA) 180 MG tablet Take 180 mg by mouth every evening.   . gabapentin (NEURONTIN) 100 MG capsule Take 200mg  in the morning and 100mg  in the evening  . guaiFENesin (MUCINEX) 600 MG 12 hr tablet Take 1 tablet (600 mg total) by mouth 2 (two) times daily. (Patient taking differently: Take 600 mg by mouth 2  (two) times daily as needed. )  . Multiple Vitamins-Minerals (MULTIVITAMIN PO) Take by mouth daily.  Marland Kitchen oxybutynin (DITROPAN) 5 MG tablet Take 5 mg by mouth 2 (two) times daily.   Marland Kitchen venlafaxine (EFFEXOR) 75 MG tablet Take 75 mg by mouth daily.   No current facility-administered medications on file prior to visit.    Review of Systems Per HPI unless specifically indicated in ROS section     Objective:    BP 102/58 mmHg  Pulse 80  Temp(Src) 97.7 F (36.5 C) (Oral)  Wt 175 lb 12 oz (79.72 kg)  Wt Readings from Last 3 Encounters:  09/11/15 175 lb 12 oz (79.72 kg)  09/04/15 173 lb 12 oz (78.812 kg)  07/10/15 170 lb 4 oz (77.225 kg)    Physical Exam  Constitutional: He appears well-developed and well-nourished. No distress.  Abdominal: Soft. Normal appearance and bowel sounds are normal. He exhibits no distension and no mass. There is no hepatosplenomegaly. There is no tenderness. There is no rigidity, no rebound, no guarding, no CVA tenderness and negative Murphy's sign.  Musculoskeletal: He exhibits no edema.  Skin: Skin is warm and dry. No rash noted.  Psychiatric: He has a normal mood and affect.  Nursing note and vitals reviewed.  Results for orders placed or performed in visit on 09/04/15  Urine culture  Result Value Ref Range  Colony Count 80,000 COLONIES/ML    Organism ID, Bacteria Multiple bacterial morphotypes present, none    Organism ID, Bacteria predominant. Suggest appropriate recollection if     Organism ID, Bacteria clinically indicated.   CBC with Differential/Platelet  Result Value Ref Range   WBC 17.5 (H) 4.0 - 10.5 K/uL   RBC 3.16 (L) 4.22 - 5.81 Mil/uL   Hemoglobin 11.7 (L) 13.0 - 17.0 g/dL   HCT 35.1 (L) 39.0 - 52.0 %   MCV 111.1 Repeated and verified X2. (H) 78.0 - 100.0 fl   MCHC 33.3 30.0 - 36.0 g/dL   RDW 14.2 11.5 - 15.5 %   Platelets 141.0 (L) 150.0 - 400.0 K/uL   Neutrophils Relative % 84.5 (H) 43.0 - 77.0 %   Lymphocytes Relative 9.9 (L) 12.0  - 46.0 %   Monocytes Relative 5.1 3.0 - 12.0 %   Eosinophils Relative 0.2 0.0 - 5.0 %   Basophils Relative 0.3 0.0 - 3.0 %   Neutro Abs 14.8 (H) 1.4 - 7.7 K/uL   Lymphs Abs 1.7 0.7 - 4.0 K/uL   Monocytes Absolute 0.9 0.1 - 1.0 K/uL   Eosinophils Absolute 0.0 0.0 - 0.7 K/uL   Basophils Absolute 0.0 0.0 - 0.1 K/uL  POCT Urinalysis Dipstick  Result Value Ref Range   Color, UA Yellow    Clarity, UA Clear    Glucose, UA Negative    Bilirubin, UA Negative    Ketones, UA Negative    Spec Grav, UA 1.015    Blood, UA Negative    pH, UA 6.0    Protein, UA Negative    Urobilinogen, UA 0.2    Nitrite, UA Negative    Leukocytes, UA Negative Negative      Assessment & Plan:  Asks about prevagen for memory. Problem List Items Addressed This Visit    Macrocytic anemia    Recheck in 2-[redacted] wks along with WBC - ?reactive changes from UTI infection.       Fever, unspecified - Primary    Presumed due to UTI. Completing treatment with CTX IM x1 and keflex course. Suggested cathed specimen next time we need to check urine.           Follow up plan: Return in about 3 months (around 12/10/2015), or as needed, for medicare wellness visit.

## 2015-09-11 NOTE — Assessment & Plan Note (Signed)
Presumed due to UTI. Completing treatment with CTX IM x1 and keflex course. Suggested cathed specimen next time we need to check urine.

## 2015-09-11 NOTE — Progress Notes (Signed)
Pre visit review using our clinic review tool, if applicable. No additional management support is needed unless otherwise documented below in the visit note. 

## 2015-09-11 NOTE — Assessment & Plan Note (Signed)
Recheck in 2-[redacted] wks along with WBC - ?reactive changes from UTI infection.

## 2015-09-21 ENCOUNTER — Ambulatory Visit (INDEPENDENT_AMBULATORY_CARE_PROVIDER_SITE_OTHER): Payer: Medicare Other | Admitting: Neurology

## 2015-09-21 ENCOUNTER — Encounter: Payer: Self-pay | Admitting: Neurology

## 2015-09-21 VITALS — BP 111/66 | HR 79 | Ht 65.0 in | Wt 173.0 lb

## 2015-09-21 DIAGNOSIS — G250 Essential tremor: Secondary | ICD-10-CM | POA: Diagnosis not present

## 2015-09-21 DIAGNOSIS — E538 Deficiency of other specified B group vitamins: Secondary | ICD-10-CM

## 2015-09-21 DIAGNOSIS — R269 Unspecified abnormalities of gait and mobility: Secondary | ICD-10-CM

## 2015-09-21 MED ORDER — GABAPENTIN 100 MG PO CAPS: 200.0000 mg | ORAL_CAPSULE | Freq: Three times a day (TID) | ORAL | Status: DC

## 2015-09-21 NOTE — Progress Notes (Signed)
Reason for visit: Tremor  Referring physician: Dr. Weston Anna is a 82 y.o. male  History of present illness:  Mr. Satterly is an 82 year old right-handed white male with a history of tremor that was lifelong in nature. The patient has had gradual worsening of the tremor as he has gotten older. The patient was last seen through this office on 05/29/2010 for the tremor. The patient had been treated with low dose gabapentin with some benefit, he remains on a total of 300 mg a day of the medication, but the gabapentin is no longer effective in controlling the tremor. The patient has a lot of difficulty with handwriting and with feeding himself. He has gotten to the point where some days he is unable to feed himself. The patient has also had a significant decline in his ability to ambulate. He fell in February 2016, struck the back of his head. MRI done at that time showed some atrophy, and ventriculomegaly felt secondary to atrophy. Some small vessel disease was noted. The patient has continued to decline with his gait, and he has a tendency to fall backwards. He has also has some issues controlling the bladder. He has had troubles with memory dating back at least 3 years. He is no longer able to operate a motor vehicle, he requires assistance keeping up with medications and appointments. He has significant short-term memory problems, he repeats himself frequently. He reports no numbness or weakness of the extremities. The tremor affects the head and neck, jaw, arms, and he has a vocal tremor. He also has had some hoarseness of his voice. He is sent to this office for an evaluation. He does have a history of vitamin B12 deficiency, he is on oral supplementation.  Past Medical History  Diagnosis Date  . GERD (gastroesophageal reflux disease)     h/o PUD  . CAD (coronary artery disease)     cath 2000 30% single vessel, normal nuclear stress test 12/03/2010, no evidence ischemia  . CKD  (chronic kidney disease) stage 3, GFR 30-59 ml/min     baseline Cr 1.7  . Depression   . Hx pulmonary embolism 08/1991    negative hypercoagulable panel 12/2014  . History of phlebitis   . Allergic rhinitis   . Urge incontinence   . Elevated PSA     previous-normalized (followed by Dr. Jeffie Pollock, rec no repeat unless urinary sxs)  . HLD (hyperlipidemia)     hypertriglyceridemia  . Asthma     remote  . Arthritis   . Blood transfusion 1990's  . Schatzki's ring 02/2012    s/p dilation [name]  . History of fracture of right hip   . Hearing loss 06/2012    eval - rec annual exam  . Essential tremor 09/27/2008    reviewed eval by Dr Jannifer Franklin in chart 2011   . Prediabetes   . DISH (diffuse idiopathic skeletal hyperostosis) 03/2013    lumbar spine on xray  . DDD (degenerative disc disease) 03/2013    by CT, diffuse multilevel cervical and lumbar spondylosis  . Osteoporosis 11/2013    DEXA T score -2.7  . Macrocytic anemia     stable B12/folate and periph smear 05/2013  . Sleep apnea     no CPAP- sleep apnea "cleared up 15 years ago"  . Dizziness     multifactorial, s/p PT at [facility] 06/2014 with HEP  . History of pyelonephritis 05/2014    hospitalization with sepsis  . Closed  C7 fracture ([place]) 11/06/2014  . UTI (urinary tract infection) 05/13/2015  . UTI (urinary tract infection) due to Enterococcus 05/13/2015  . Candidal urethritis in male 06/15/2015    Past Surgical History  Procedure Laterality Date  . Tonsillectomy  1965  . Hernia repair  1979    Left  . Cystoscopy  1992    for kidney stones  . Exploratory laparotomy  1996    with incidental appendectomy  . Appendectomy  1996  . Knee arthroscopy  03/24/02    Right (Dr. Mauri Pole)  . Orif femoral neck fracture w/ dhs  08/03/02    Right (Dr. Mauri Pole)  . Ct abd w & pelvis wo cm  07/2001    Scarring of right lung, stable negative o/w  . Ct abd w & pelvis wo cm  11/2000    ? stones, right LL scarring  . V/q scan  06/1999    negative  .  Col[place]oscopy  1998    [place]  . US echocardiography  12/28/2007    Mild aortic valve calcification EF 55%, basically nml  . Carotid u/s  12/28/2007    nml  . Eeg  11/12/2009    nml  . Mri  11/2009    Head, nml  . Cataract extraction  Jan, March 2011    bilateral  . Colonoscopy  07/2010    5 polyps, adenomatous, rec rpt 3 yrs  . Esophagogastroduodenoscopy  02/2012    dilation of schatzki's ring [name]  . Upper gastrointestinal endoscopy    . Colonoscopy  04/2014    3 polyps, adenomatous, f/u open ended given age [name]  . Cardiac catheterization  2000    30% one vessel  . Cardiovascular stress test  03/2015    no ischemia, low risk, EF 76%    Family History  Problem Relation Age of Onset  . Stroke Mother   . Hypertension Mother   . Cancer Brother     prostate with mets  . Blindness Brother     legally  . Diabetes Brother   . Pulmonary embolism Sister     from shoulder operation  . Alcohol abuse Brother   . Colon cancer Neg Hx   . Esophageal cancer Neg Hx   . Rectal cancer Neg Hx   . Stomach cancer Neg Hx     Social history:  reports that he has never smoked. He has never used smokeless tobacco. He reports that he does not drink alcohol or use illicit drugs.  Medications:  Prior to Admission medications   Medication Sig Start Date End Date Taking? Authorizing Provider  albuterol (PROVENTIL HFA;VENTOLIN HFA) 108 (90 BASE) MCG/ACT inhaler Inhale 2 puffs into the lungs every 6 (six) hours as needed for wheezing or shortness of breath.   Yes Historical Provider, MD  aspirin EC 81 MG tablet Take 81 mg by mouth daily.   Yes Historical Provider, MD  Biotin 2500 MCG CAPS Take 1 capsule by mouth daily.   Yes Historical Provider, MD  Cholecalciferol (VITAMIN D) 2000 UNITS CAPS Take 1 capsule by mouth daily.   Yes Historical Provider, MD  clotrimazole (LOTRIMIN) 1 % cream Apply 1 application topically 2 (two) times daily. 05/02/15  Yes Pleas Koch, NP  Coenzyme Q10  (COQ-10) 100 MG CAPS Take 1 capsule by mouth at bedtime.    Yes Historical Provider, MD  Cyanocobalamin (B-12 PO) Take 10,000 mcg by mouth every Monday, Wednesday, and Friday.    Yes Historical Provider, MD  DEXILANT 60 MG capsule TAKE 1 CAPSULE DAILY 02/12/15  Yes Milus Banister, MD  docusate sodium (COLACE) 100 MG capsule Take 100 mg by mouth daily.   Yes Historical Provider, MD  fenofibrate (TRICOR) 145 MG tablet TAKE 1 TABLET DAILY 08/23/15  Yes Ria Bush, MD  fexofenadine (ALLEGRA) 180 MG tablet Take 180 mg by mouth every evening.    Yes Historical Provider, MD  gabapentin (NEURONTIN) 100 MG capsule Take 200mg  in the morning and 100mg  in the evening 06/25/15  Yes Ria Bush, MD  guaiFENesin (MUCINEX) 600 MG 12 hr tablet Take 1 tablet (600 mg total) by mouth 2 (two) times daily. Patient taking differently: Take 600 mg by mouth 2 (two) times daily as needed.  11/09/14  Yes Reyne Dumas, MD  Multiple Vitamins-Minerals (MULTIVITAMIN PO) Take by mouth daily.   Yes Historical Provider, MD  oxybutynin (DITROPAN) 5 MG tablet Take 5 mg by mouth 2 (two) times daily.    Yes Historical Provider, MD  venlafaxine (EFFEXOR) 75 MG tablet Take 75 mg by mouth daily.   Yes Historical Provider, MD      Allergies  Allergen Reactions  . Lactose Intolerance (Gi)   . Nizatidine     REACTION: Rash (Axid)  . Penicillins     REACTION: Rash  . Sulfadiazine     REACTION: Hives  . Aricept [Donepezil Hcl] Rash  . Lipitor [Atorvastatin] Rash    ROS:  Out of a complete 14 system review of symptoms, the patient complains only of the following symptoms, and all other reviewed systems are negative.  Hearing loss Skin rash, itching Snoring Urination problems, incontinence, impotence Anemia, easy bruising Muscle cramps Allergies Memory loss, confusion, weakness, difficulty swallowing, tremor Depression, anxiety, too much sleep, decreased energy, disinterest in activities Sleepiness,  snoring  Blood pressure 111/66, pulse 79, height 5\' 5"  (1.651 m), weight 173 lb (78.472 kg).  Physical Exam  General: The patient is alert and cooperative at the time of the examination.  Eyes: Pupils are equal, round, and reactive to light. Discs are flat bilaterally.  Neck: The neck is supple, no carotid bruits are noted.  Respiratory: The respiratory examination is clear.  Cardiovascular: The cardiovascular examination reveals a regular rate and rhythm, no obvious murmurs or rubs are noted.  Skin: Extremities are without significant edema.  Neurologic Exam  Mental status: The patient is alert and oriented x 2 at the time of the examination ( not oriented to date ). The Mini-Mental Status Examination done today shows a total score of 24/30. The patient is able to name 2 animals in 30 seconds .  Cranial nerves: Facial symmetry is present. Slight bilateral ptosis is seen. There is good sensation of the face to pinprick and soft touch bilaterally. The strength of the facial muscles and the muscles to head turning and shoulder shrug are normal bilaterally. Speech is hypophonic, a vocal tremor is noted. Extraocular movements are full. Visual fields are full. The tongue is midline, and the patient has symmetric elevation of the soft palate. No obvious hearing deficits are noted.  Motor: The motor testing reveals 5 over 5 strength of all 4 extremities. Good symmetric motor tone is noted throughout.  Sensory: Sensory testing is intact to pinprick, soft touch, vibration sensation, and position sense on all 4 extremities. No evidence of extinction is noted.  Coordination: Cerebellar testing reveals good finger-nose-finger and heel-to-shin bilaterally. A prominent intention tremor seen with finger-nose-finger bilaterally.  Gait and station: Gait is wide-based, unsteady. The  patient usually uses a walker for ambulation. Tandem gait was not attempted. Romberg is negative, but is  unsteady.  Reflexes: Deep tendon reflexes are symmetric and normal bilaterally. Toes are downgoing bilaterally.   Assessment/Plan:  1. Essential tremor, lifelong  2. Progressive gait disorder  3. Memory disorder  The patient has had a significant change in his underlying clinical condition. The patient is on low-dose gabapentin for his tremor, we will go up to 200 mg 3 times daily. The patient has had a significant worsening of his ability to ambulate over the last one year, we will recheck the MRI the brain at this time. He will have blood work done today. He currently uses a walker for ambulation. He will follow-up in 4 months. He has been placed on Aricept previously for the memory, but he was taken off of this as it was felt that this may have called his rash. He will be following up with his dermatologist in February, if it is felt that the Aricept is not the etiology of the rash, he could go back on this medication.  Jill Alexanders MD 09/21/2015 5:58 PM  [facility] [address], [place] [address]  Phone [phone] Fax [phone]

## 2015-09-21 NOTE — Patient Instructions (Addendum)
  Increase the gabapentin to 200 mg three times a day.   Tremor A tremor is trembling or shaking that you cannot control. Most tremors affect the hands or arms. Tremors can also affect the head, vocal cords, face, and other parts of the body. There are many types of tremors. Common types include:   Essential tremor. These usually occur in people over the age of 35. It may run in families and can happen in otherwise healthy people.   Resting tremor. These occur when the muscles are at rest, such as when your hands are resting in your lap. People with Parkinson disease often have resting tremors.   Postural tremor. These occur when you try to hold a pose, such as keeping your hands outstretched.   Kinetic tremor. These occur during purposeful movement, such as trying to touch a finger to your nose.   Task-specific tremor. These may occur when you perform tasks such as handwriting, speaking, or standing.   Psychogenic tremor. These dramatically lessen or disappear when you are distracted. They can happen in people of all ages.  Some types of tremors have no known cause. Tremors can also be a symptom of nervous system problems (neurological disorders) that may occur with aging. Some tremors go away with treatment while others do not.  HOME CARE INSTRUCTIONS Watch your tremor for any changes. The following actions may help to lessen any discomfort you are feeling:  Take medicines only as directed by your health care provider.   Limit alcohol intake to no more than 1 drink per day for nonpregnant women and 2 drinks per day for men. One drink equals 12 oz of beer, 5 oz of wine, or 1 oz of hard liquor.  Do not use any tobacco products, including cigarettes, chewing tobacco, or electronic cigarettes. If you need help quitting, ask your health care provider.   Avoid extreme heat or cold.   Limit the amount of caffeine you consumeas directed by your health care provider.   Try to get  8 hours of sleep each night.  Find ways to manage your stress, such as meditation or yoga.  Keep all follow-up visits as directed by your health care provider. This is important. SEEK MEDICAL CARE IF:  You start having a tremor after starting a new medicine.  You have tremor with other symptoms such as:  Numbness.  Tingling.  Pain.  Weakness.  Your tremor gets worse.  Your tremor interferes with your day-to-day life.   This information is not intended to replace advice given to you by your health care provider. Make sure you discuss any questions you have with your health care provider.   Document Released: 08/15/2002 Document Revised: 09/15/2014 Document Reviewed: 02/20/2014 Elsevier Interactive Patient Education [employer].

## 2015-09-22 LAB — RPR: RPR: NONREACTIVE

## 2015-09-22 LAB — COPPER, SERUM: Copper: 104 ug/dL (ref 72–166)

## 2015-09-22 LAB — VITAMIN B12: Vitamin B-12: >2000 pg/mL — ABNORMAL HIGH (ref 211–946)

## 2015-10-01 DIAGNOSIS — T466X5D Adverse effect of antihyperlipidemic and antiarteriosclerotic drugs, subsequent encounter: Secondary | ICD-10-CM | POA: Diagnosis not present

## 2015-10-01 DIAGNOSIS — L271 Localized skin eruption due to drugs and medicaments taken internally: Secondary | ICD-10-CM | POA: Diagnosis not present

## 2015-10-02 ENCOUNTER — Ambulatory Visit
Admission: RE | Admit: 2015-10-02 | Discharge: 2015-10-02 | Disposition: A | Discharge status: Home / Self Care | Payer: Medicare Other | Source: Ambulatory Visit | Attending: Neurology | Admitting: Neurology

## 2015-10-02 ENCOUNTER — Other Ambulatory Visit: Payer: Self-pay | Admitting: Family Medicine

## 2015-10-02 ENCOUNTER — Other Ambulatory Visit: Payer: Medicare Other

## 2015-10-02 DIAGNOSIS — G250 Essential tremor: Secondary | ICD-10-CM | POA: Diagnosis not present

## 2015-10-02 DIAGNOSIS — D539 Nutritional anemia, unspecified: Secondary | ICD-10-CM

## 2015-10-02 DIAGNOSIS — N183 Chronic kidney disease, stage 3 unspecified: Secondary | ICD-10-CM

## 2015-10-02 DIAGNOSIS — R269 Unspecified abnormalities of gait and mobility: Secondary | ICD-10-CM

## 2015-10-02 DIAGNOSIS — D696 Thrombocytopenia, unspecified: Secondary | ICD-10-CM

## 2015-10-03 ENCOUNTER — Other Ambulatory Visit (INDEPENDENT_AMBULATORY_CARE_PROVIDER_SITE_OTHER): Payer: Medicare Other

## 2015-10-03 DIAGNOSIS — N183 Chronic kidney disease, stage 3 unspecified: Secondary | ICD-10-CM

## 2015-10-03 DIAGNOSIS — D696 Thrombocytopenia, unspecified: Secondary | ICD-10-CM | POA: Diagnosis not present

## 2015-10-03 DIAGNOSIS — D539 Nutritional anemia, unspecified: Secondary | ICD-10-CM

## 2015-10-03 LAB — COMPREHENSIVE METABOLIC PANEL
ALBUMIN: 4 g/dL (ref 3.5–5.2)
ALK PHOS: 44 U/L (ref 39–117)
ALT: 23 U/L (ref 0–53)
AST: 30 U/L (ref 0–37)
BUN: 23 mg/dL (ref 6–23)
CO2: 29 mEq/L (ref 19–32)
Calcium: 9.5 mg/dL (ref 8.4–10.5)
Chloride: 106 mEq/L (ref 96–112)
Creatinine, Ser: 1.5 mg/dL (ref 0.40–1.50)
GFR: 47.59 mL/min — ABNORMAL LOW (ref >60.00)
Glucose, Bld: 123 mg/dL — ABNORMAL HIGH (ref 70–99)
POTASSIUM: 4.7 meq/L (ref 3.5–5.1)
SODIUM: 142 meq/L (ref 135–145)
TOTAL PROTEIN: 6.6 g/dL (ref 6.0–8.3)
Total Bilirubin: 1.1 mg/dL (ref 0.2–1.2)

## 2015-10-03 LAB — TSH: TSH: 2.16 u[IU]/mL (ref 0.35–4.50)

## 2015-10-03 LAB — CBC WITH DIFFERENTIAL/PLATELET
BASOS ABS: 0 10*3/uL (ref 0.0–0.1)
Basophils Relative: 0.7 % (ref 0.0–3.0)
Eosinophils Absolute: 0.2 10*3/uL (ref 0.0–0.7)
Eosinophils Relative: 3.4 % (ref 0.0–5.0)
HCT: 36.1 % — ABNORMAL LOW (ref 39.0–52.0)
Hemoglobin: 12.2 g/dL — ABNORMAL LOW (ref 13.0–17.0)
Lymphocytes Relative: 25.6 % (ref 12.0–46.0)
Lymphs Abs: 1.4 10*3/uL (ref 0.7–4.0)
MCHC: 33.7 g/dL (ref 30.0–36.0)
MCV: 110.3 fl — ABNORMAL HIGH (ref 78.0–100.0)
MONO ABS: 0.4 10*3/uL (ref 0.1–1.0)
Monocytes Relative: 7.3 % (ref 3.0–12.0)
NEUTROS ABS: 3.4 10*3/uL (ref 1.4–7.7)
NEUTROS PCT: 63 % (ref 43.0–77.0)
Platelets: 170 10*3/uL (ref 150.0–400.0)
RBC: 3.27 Mil/uL — ABNORMAL LOW (ref 4.22–5.81)
RDW: 14.9 % (ref 11.5–15.5)
WBC: 5.5 10*3/uL (ref 4.0–10.5)

## 2015-10-04 ENCOUNTER — Telehealth: Payer: Self-pay | Admitting: Neurology

## 2015-10-04 LAB — PATHOLOGIST SMEAR REVIEW

## 2015-10-04 NOTE — Telephone Encounter (Signed)
   I called the patient. The MRI the brain is unchanged from prior study about one year ago. The patient has some cortical atrophy, mild ventricular enlargement associated with this. If the patient has not undergone physical therapy for gait training, we can initiate this at this time. Patient is to contact our office if he is amenable to this.  MRI brain 10/04/15:  IMPRESSION:  Abnormal MRI brain (without) demonstrating: 1. Mild diffuse, moderate mesial temporal and corpus callosum atrophy. 2. Mild periventricular, subcortical and pontine chronic small vessel ischemic disease.  3. No acute findings. 4. No change from MRI on 11/06/14.

## 2015-10-06 ENCOUNTER — Encounter: Payer: Self-pay | Admitting: Family Medicine

## 2015-10-12 ENCOUNTER — Telehealth: Payer: Self-pay | Admitting: Gastroenterology

## 2015-10-12 NOTE — Telephone Encounter (Signed)
No answer no voice mail  

## 2015-10-17 NOTE — Telephone Encounter (Signed)
No answer will wait for further communication from the pt

## 2015-10-31 ENCOUNTER — Encounter: Payer: Self-pay | Admitting: Primary Care

## 2015-10-31 ENCOUNTER — Ambulatory Visit (INDEPENDENT_AMBULATORY_CARE_PROVIDER_SITE_OTHER): Payer: Medicare Other | Admitting: Primary Care

## 2015-10-31 VITALS — BP 116/62 | HR 75 | Temp 97.8°F | Ht 65.0 in | Wt 172.8 lb

## 2015-10-31 DIAGNOSIS — L03317 Cellulitis of buttock: Secondary | ICD-10-CM | POA: Diagnosis not present

## 2015-10-31 MED ORDER — DOXYCYCLINE HYCLATE 100 MG PO TABS: 100.0000 mg | ORAL_TABLET | Freq: Two times a day (BID) | ORAL | Status: DC

## 2015-10-31 NOTE — Patient Instructions (Signed)
Start Doxycycline antibiotic. Take 1 tablet by mouth twice daily for 10 days.  Please notify me if no improvement in redness, swelling, pain, drainage in the next 3-4 days.  Allow the wound to drain on its own to help expel the bacteria.   It was a pleasure meeting you!

## 2015-10-31 NOTE — Progress Notes (Signed)
Subjective:    Patient ID: [name], male    DOB: 05-Apr-1931, 82 y.o.   MRN: [id number]  HPI  [name] is an 82 year old male who presents today with a chief complaint of boil. His boil is located to his right buttocks and his been present for the past 2 days. Over the past 2 days his wife has noticed it getting larger. He also reports erythema, slight drainage this morning, and pain. Denies fevers, chills. He's not taken anything OTC for his symptoms. He does wear depends for urge incontinence.   Review of Systems  Constitutional: Negative for fever, chills and fatigue.  Skin: Positive for color change and wound.  Neurological: Negative for weakness.       Past Medical History  Diagnosis Date  . GERD (gastroesophageal reflux disease)     h/o PUD  . CAD (coronary artery disease)     cath 2000 30% single vessel, normal nuclear stress test 12/03/2010, no evidence ischemia  . CKD (chronic kidney disease) stage 3, GFR 30-59 ml/min     baseline Cr 1.7  . Depression   . Hx pulmonary embolism 08/1991    negative hypercoagulable panel 12/2014  . History of phlebitis   . Allergic rhinitis   . Urge incontinence   . Elevated PSA     previous-normalized (followed by Dr. Jeffie Pollock, rec no repeat unless urinary sxs)  . HLD (hyperlipidemia)     hypertriglyceridemia  . Asthma     remote  . Arthritis   . Blood transfusion 1990's  . Schatzki's ring 02/2012    s/p dilation [name]  . History of fracture of right hip   . Hearing loss 06/2012    eval - rec annual exam  . Essential tremor 09/27/2008    reviewed eval by Dr Jannifer Franklin in chart 2011   . Prediabetes   . DISH (diffuse idiopathic skeletal hyperostosis) 03/2013    lumbar spine on xray  . DDD (degenerative disc disease) 03/2013    by CT, diffuse multilevel cervical and lumbar spondylosis  . Osteoporosis 11/2013    DEXA T score -2.7  . Macrocytic anemia 2014    stable B12/folate and periph smear 05/2013, again periph smear 2017  with mature cells, mild dacrocytosis  . Sleep apnea     no CPAP- sleep apnea "cleared up 15 years ago"  . Dizziness     multifactorial, s/p PT at [facility] 06/2014 with HEP  . History of pyelonephritis 05/2014    hospitalization with sepsis  . Closed C7 fracture ([place]) 11/06/2014  . UTI (urinary tract infection) 05/13/2015  . UTI (urinary tract infection) due to Enterococcus 05/13/2015  . Candidal urethritis in male 06/15/2015    Social History   Social History  . Marital Status: Married    Spouse Name: N/A  . Number of Children: 2  . Years of Education: N/A   Occupational History  . 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 Smoking status: Never Smoker   . Smokeless tobacco: Never Used  . Alcohol Use: No  . Drug Use: No  . Sexual Activity: Not on file   Other Topics Concern  . Not on file   Social History Narrative   Married with 2 children   Husband of Antonyo Huebel    Retired: was principal    Activity: walks dog 2-3 times daily about 3min, frequent stops    Diet: some water, good fruits/vegetables,  fish 1x/wk, no sodas.      Advanced directives: Chauncey Reading is wife, [name]. Does not want prolonged life support if terminal      Patient does not drink caffeine.   Patient is right handed.     Past Surgical History  Procedure Laterality Date  . Tonsillectomy  1965  . Hernia repair  1979    Left  . Cystoscopy  1992    for kidney stones  . Exploratory laparotomy  1996    with incidental appendectomy  . Appendectomy  1996  . Knee arthroscopy  03/24/02    Right (Dr. Mauri Pole)  . Orif femoral neck fracture w/ dhs  08/03/02    Right (Dr. Mauri Pole)  . Ct abd w & pelvis wo cm  07/2001    Scarring of right lung, stable negative o/w  . Ct abd w & pelvis wo cm  11/2000    ? stones, right LL scarring  . V/q scan  06/1999    negative  . [place]oscopy  1998    [place]  . US echocardiography  12/28/2007    Mild aortic valve calcification EF 55%,  basically nml  . Carotid u/s  12/28/2007    nml  . Eeg  11/12/2009    nml  . Mri  11/2009    Head, nml  . Cataract extraction  Jan, March 2011    bilateral  . Colonoscopy  07/2010    5 polyps, adenomatous, rec rpt 3 yrs  . Esophagogastroduodenoscopy  02/2012    dilation of schatzki's ring [name]  . Upper gastrointestinal endoscopy    . Colonoscopy  04/2014    3 polyps, adenomatous, f/u open ended given age [name]  . Cardiac catheterization  2000    30% one vessel  . Cardiovascular stress test  03/2015    no ischemia, low risk, EF 76%    Family History  Problem Relation Age of Onset  . Stroke Mother   . Hypertension Mother   . Cancer Brother     prostate with mets  . Blindness Brother     legally  . Diabetes Brother   . Pulmonary embolism Sister     from shoulder operation  . Alcohol abuse Brother   . Colon cancer Neg Hx   . Esophageal cancer Neg Hx   . Rectal cancer Neg Hx   . Stomach cancer Neg Hx     Allergies  Allergen Reactions  . Lactose Intolerance (Gi)   . Nizatidine     REACTION: Rash (Axid)  . Penicillins     REACTION: Rash  . Sulfadiazine     REACTION: Hives  . Aricept [Donepezil Hcl] Rash  . Lipitor [Atorvastatin] Rash    Current Outpatient Prescriptions on File Prior to Visit  Medication Sig Dispense Refill  . albuterol (PROVENTIL HFA;VENTOLIN HFA) 108 (90 BASE) MCG/ACT inhaler Inhale 2 puffs into the lungs every 6 (six) hours as needed for wheezing or shortness of breath.    Marland Kitchen aspirin EC 81 MG tablet Take 81 mg by mouth daily.    . Biotin 2500 MCG CAPS Take 1 capsule by mouth daily.    . Cholecalciferol (VITAMIN D) 2000 UNITS CAPS Take 1 capsule by mouth daily.    . clotrimazole (LOTRIMIN) 1 % cream Apply 1 application topically 2 (two) times daily. 30 g 0  . Coenzyme Q10 (COQ-10) 100 MG CAPS Take 1 capsule by mouth at bedtime.     . Cyanocobalamin (B-12 PO) Take 10,000 mcg  by mouth every Monday, Wednesday, and Friday.     Marland Kitchen DEXILANT 60 MG  capsule TAKE 1 CAPSULE DAILY 90 capsule 2  . docusate sodium (COLACE) 100 MG capsule Take 100 mg by mouth daily.    . fenofibrate (TRICOR) 145 MG tablet TAKE 1 TABLET DAILY 90 tablet 2  . fexofenadine (ALLEGRA) 180 MG tablet Take 180 mg by mouth every evening.     . gabapentin (NEURONTIN) 100 MG capsule Take 2 capsules (200 mg total) by mouth 3 (three) times daily. 540 capsule 1  . guaiFENesin (MUCINEX) 600 MG 12 hr tablet Take 1 tablet (600 mg total) by mouth 2 (two) times daily. (Patient taking differently: Take 600 mg by mouth 2 (two) times daily as needed. ) 20 tablet 0  . Multiple Vitamins-Minerals (MULTIVITAMIN PO) Take by mouth daily.    Marland Kitchen oxybutynin (DITROPAN) 5 MG tablet Take 5 mg by mouth 2 (two) times daily.     Marland Kitchen venlafaxine (EFFEXOR) 75 MG tablet Take 75 mg by mouth daily.     No current facility-administered medications on file prior to visit.    BP 116/62 mmHg  Pulse 75  Temp(Src) 97.8 F (36.6 C) (Oral)  Ht 5\' 5"  (1.651 m)  Wt 172 lb 12.8 oz (78.382 kg)  BMI 28.76 kg/m2  SpO2 96%    Objective:   Physical Exam  Constitutional: He appears well-nourished.  Cardiovascular: Normal rate and regular rhythm.   Pulmonary/Chest: Effort normal and breath sounds normal.  Skin: Skin is dry.  1 cm opened boil to right buttocks. No drainage. Mild-moderate erythema. Tender. Does appear slightly infectious.           Assessment & Plan:  Boil:  Located to right buttocks x 2 days. Exam with open wound, erythema, tenderness. Discussed to keep wound open to allow for drainage. Home care instructions provided. Will cover with doxycycline course as it does appear infectious. Return precautions provided.

## 2015-10-31 NOTE — Progress Notes (Signed)
Pre visit review using our clinic review tool, if applicable. No additional management support is needed unless otherwise documented below in the visit note. 

## 2015-11-01 ENCOUNTER — Other Ambulatory Visit: Payer: Self-pay

## 2015-11-01 MED ORDER — DEXLANSOPRAZOLE 60 MG PO CPDR: 1.0000 | DELAYED_RELEASE_CAPSULE | Freq: Every day | ORAL | Status: DC

## 2015-11-08 ENCOUNTER — Telehealth: Payer: Self-pay

## 2015-11-08 ENCOUNTER — Telehealth: Payer: Self-pay | Admitting: Gastroenterology

## 2015-11-08 NOTE — Telephone Encounter (Signed)
Mrs Krikorian left v/m; pt having problems swallowing; pt has seen Dr [name] to have esophagus stretched x 3. Dr [name] office cannot schedule appt until May 2017. Mrs Quam does not want pt to wait 3 months before seen by Dr [name]. Mrs Mazzarese wants to know if Dr Darnell Level could get pt seen sooner at Dr [name] office or does Dr Darnell Level want to see pt or what to do. Mrs Fradette request cb.

## 2015-11-09 NOTE — Telephone Encounter (Signed)
Patient will come in on Monday and see Amy Esterwood PA at 2:30

## 2015-11-12 ENCOUNTER — Inpatient Hospital Stay (HOSPITAL_COMMUNITY): Payer: Medicare Other

## 2015-11-12 ENCOUNTER — Emergency Department (HOSPITAL_COMMUNITY): Payer: Medicare Other

## 2015-11-12 ENCOUNTER — Ambulatory Visit: Payer: Medicare Other | Admitting: Physician Assistant

## 2015-11-12 ENCOUNTER — Inpatient Hospital Stay (HOSPITAL_COMMUNITY)
Admission: EM | Admit: 2015-11-12 | Discharge: 2015-11-16 | DRG: 065 | Disposition: A | Discharge status: Home / Self Care | Payer: Medicare Other | Attending: Internal Medicine | Admitting: Internal Medicine

## 2015-11-12 ENCOUNTER — Encounter (HOSPITAL_COMMUNITY): Payer: Self-pay | Admitting: *Deleted

## 2015-11-12 ENCOUNTER — Telehealth: Payer: Self-pay | Admitting: Family Medicine

## 2015-11-12 DIAGNOSIS — Z880 Allergy status to penicillin: Secondary | ICD-10-CM | POA: Diagnosis not present

## 2015-11-12 DIAGNOSIS — R1310 Dysphagia, unspecified: Secondary | ICD-10-CM | POA: Diagnosis not present

## 2015-11-12 DIAGNOSIS — Z8249 Family history of ischemic heart disease and other diseases of the circulatory system: Secondary | ICD-10-CM | POA: Diagnosis not present

## 2015-11-12 DIAGNOSIS — Z7982 Long term (current) use of aspirin: Secondary | ICD-10-CM | POA: Diagnosis not present

## 2015-11-12 DIAGNOSIS — R4701 Aphasia: Secondary | ICD-10-CM | POA: Diagnosis present

## 2015-11-12 DIAGNOSIS — E538 Deficiency of other specified B group vitamins: Secondary | ICD-10-CM | POA: Diagnosis present

## 2015-11-12 DIAGNOSIS — I9589 Other hypotension: Secondary | ICD-10-CM

## 2015-11-12 DIAGNOSIS — Q394 Esophageal web: Secondary | ICD-10-CM | POA: Diagnosis not present

## 2015-11-12 DIAGNOSIS — K219 Gastro-esophageal reflux disease without esophagitis: Secondary | ICD-10-CM | POA: Diagnosis present

## 2015-11-12 DIAGNOSIS — Z881 Allergy status to other antibiotic agents status: Secondary | ICD-10-CM | POA: Diagnosis not present

## 2015-11-12 DIAGNOSIS — N183 Chronic kidney disease, stage 3 (moderate): Secondary | ICD-10-CM | POA: Diagnosis not present

## 2015-11-12 DIAGNOSIS — R471 Dysarthria and anarthria: Secondary | ICD-10-CM | POA: Diagnosis present

## 2015-11-12 DIAGNOSIS — Z86718 Personal history of other venous thrombosis and embolism: Secondary | ICD-10-CM

## 2015-11-12 DIAGNOSIS — D696 Thrombocytopenia, unspecified: Secondary | ICD-10-CM | POA: Diagnosis not present

## 2015-11-12 DIAGNOSIS — K222 Esophageal obstruction: Secondary | ICD-10-CM | POA: Diagnosis present

## 2015-11-12 DIAGNOSIS — Z9109 Other allergy status, other than to drugs and biological substances: Secondary | ICD-10-CM

## 2015-11-12 DIAGNOSIS — Z833 Family history of diabetes mellitus: Secondary | ICD-10-CM

## 2015-11-12 DIAGNOSIS — I6339 Cerebral infarction due to thrombosis of other cerebral artery: Secondary | ICD-10-CM | POA: Diagnosis not present

## 2015-11-12 DIAGNOSIS — I6789 Other cerebrovascular disease: Secondary | ICD-10-CM | POA: Diagnosis not present

## 2015-11-12 DIAGNOSIS — M810 Age-related osteoporosis without current pathological fracture: Secondary | ICD-10-CM | POA: Diagnosis present

## 2015-11-12 DIAGNOSIS — F0390 Unspecified dementia without behavioral disturbance: Secondary | ICD-10-CM | POA: Diagnosis present

## 2015-11-12 DIAGNOSIS — R2990 Unspecified symptoms and signs involving the nervous system: Secondary | ICD-10-CM | POA: Diagnosis not present

## 2015-11-12 DIAGNOSIS — H9190 Unspecified hearing loss, unspecified ear: Secondary | ICD-10-CM | POA: Diagnosis present

## 2015-11-12 DIAGNOSIS — Z86711 Personal history of pulmonary embolism: Secondary | ICD-10-CM

## 2015-11-12 DIAGNOSIS — Z91018 Allergy to other foods: Secondary | ICD-10-CM | POA: Diagnosis not present

## 2015-11-12 DIAGNOSIS — I6340 Cerebral infarction due to embolism of unspecified cerebral artery: Principal | ICD-10-CM | POA: Diagnosis present

## 2015-11-12 DIAGNOSIS — I959 Hypotension, unspecified: Secondary | ICD-10-CM | POA: Diagnosis present

## 2015-11-12 DIAGNOSIS — R1319 Other dysphagia: Secondary | ICD-10-CM | POA: Diagnosis not present

## 2015-11-12 DIAGNOSIS — Z8672 Personal history of thrombophlebitis: Secondary | ICD-10-CM

## 2015-11-12 DIAGNOSIS — I2510 Atherosclerotic heart disease of native coronary artery without angina pectoris: Secondary | ICD-10-CM | POA: Diagnosis present

## 2015-11-12 DIAGNOSIS — I5032 Chronic diastolic (congestive) heart failure: Secondary | ICD-10-CM | POA: Diagnosis present

## 2015-11-12 DIAGNOSIS — Z8673 Personal history of transient ischemic attack (TIA), and cerebral infarction without residual deficits: Secondary | ICD-10-CM | POA: Diagnosis present

## 2015-11-12 DIAGNOSIS — I639 Cerebral infarction, unspecified: Secondary | ICD-10-CM | POA: Diagnosis present

## 2015-11-12 DIAGNOSIS — L89159 Pressure ulcer of sacral region, unspecified stage: Secondary | ICD-10-CM | POA: Diagnosis not present

## 2015-11-12 DIAGNOSIS — R410 Disorientation, unspecified: Secondary | ICD-10-CM | POA: Diagnosis not present

## 2015-11-12 DIAGNOSIS — I129 Hypertensive chronic kidney disease with stage 1 through stage 4 chronic kidney disease, or unspecified chronic kidney disease: Secondary | ICD-10-CM | POA: Diagnosis present

## 2015-11-12 DIAGNOSIS — Z823 Family history of stroke: Secondary | ICD-10-CM | POA: Diagnosis not present

## 2015-11-12 DIAGNOSIS — F329 Major depressive disorder, single episode, unspecified: Secondary | ICD-10-CM | POA: Diagnosis present

## 2015-11-12 DIAGNOSIS — L98429 Non-pressure chronic ulcer of back with unspecified severity: Secondary | ICD-10-CM | POA: Diagnosis present

## 2015-11-12 DIAGNOSIS — R7303 Prediabetes: Secondary | ICD-10-CM | POA: Diagnosis present

## 2015-11-12 DIAGNOSIS — Z79899 Other long term (current) drug therapy: Secondary | ICD-10-CM | POA: Diagnosis not present

## 2015-11-12 DIAGNOSIS — G250 Essential tremor: Secondary | ICD-10-CM | POA: Diagnosis present

## 2015-11-12 DIAGNOSIS — E781 Pure hyperglyceridemia: Secondary | ICD-10-CM | POA: Diagnosis present

## 2015-11-12 DIAGNOSIS — I5030 Unspecified diastolic (congestive) heart failure: Secondary | ICD-10-CM

## 2015-11-12 DIAGNOSIS — R791 Abnormal coagulation profile: Secondary | ICD-10-CM

## 2015-11-12 DIAGNOSIS — R531 Weakness: Secondary | ICD-10-CM | POA: Diagnosis not present

## 2015-11-12 DIAGNOSIS — R1314 Dysphagia, pharyngoesophageal phase: Secondary | ICD-10-CM | POA: Diagnosis not present

## 2015-11-12 LAB — COMPREHENSIVE METABOLIC PANEL
ALBUMIN: 3.5 g/dL (ref 3.5–5.0)
ALK PHOS: 52 U/L (ref 38–126)
ALT: 24 U/L (ref 17–63)
ANION GAP: 10 (ref 5–15)
AST: 30 U/L (ref 15–41)
BUN: 29 mg/dL — AB (ref 6–20)
CALCIUM: 9.6 mg/dL (ref 8.9–10.3)
CO2: 25 mmol/L (ref 22–32)
Chloride: 107 mmol/L (ref 101–111)
Creatinine, Ser: 1.52 mg/dL — ABNORMAL HIGH (ref 0.61–1.24)
GFR calc Af Amer: 47 mL/min — ABNORMAL LOW (ref >60)
GFR calc non Af Amer: 41 mL/min — ABNORMAL LOW (ref >60)
GLUCOSE: 119 mg/dL — AB (ref 65–99)
POTASSIUM: 4.3 mmol/L (ref 3.5–5.1)
SODIUM: 142 mmol/L (ref 135–145)
Total Bilirubin: 1.2 mg/dL (ref 0.3–1.2)
Total Protein: 6.2 g/dL — ABNORMAL LOW (ref 6.5–8.1)

## 2015-11-12 LAB — DIFFERENTIAL
BASOS ABS: 0 10*3/uL (ref 0.0–0.1)
Basophils Relative: 0 %
Eosinophils Absolute: 0.3 10*3/uL (ref 0.0–0.7)
Eosinophils Relative: 5 %
LYMPHS ABS: 1.6 10*3/uL (ref 0.7–4.0)
Lymphocytes Relative: 27 %
MONOS PCT: 8 %
Monocytes Absolute: 0.5 10*3/uL (ref 0.1–1.0)
NEUTROS PCT: 60 %
Neutro Abs: 3.4 10*3/uL (ref 1.7–7.7)

## 2015-11-12 LAB — CBC
HCT: 33.1 % — ABNORMAL LOW (ref 39.0–52.0)
HEMATOCRIT: 35.2 % — AB (ref 39.0–52.0)
HEMOGLOBIN: 11.8 g/dL — AB (ref 13.0–17.0)
Hemoglobin: 10.9 g/dL — ABNORMAL LOW (ref 13.0–17.0)
MCH: 37.5 pg — ABNORMAL HIGH (ref 26.0–34.0)
MCH: 36.7 pg — ABNORMAL HIGH (ref 26.0–34.0)
MCHC: 33.5 g/dL (ref 30.0–36.0)
MCHC: 32.9 g/dL (ref 30.0–36.0)
MCV: 111.7 fL — ABNORMAL HIGH (ref 78.0–100.0)
MCV: 111.4 fL — ABNORMAL HIGH (ref 78.0–100.0)
PLATELETS: 120 10*3/uL — AB (ref 150–400)
Platelets: 127 10*3/uL — ABNORMAL LOW (ref 150–400)
RBC: 3.15 MIL/uL — AB (ref 4.22–5.81)
RBC: 2.97 MIL/uL — ABNORMAL LOW (ref 4.22–5.81)
RDW: 14.2 % (ref 11.5–15.5)
RDW: 14 % (ref 11.5–15.5)
WBC: 5.8 10*3/uL (ref 4.0–10.5)
WBC: 5.9 10*3/uL (ref 4.0–10.5)

## 2015-11-12 LAB — URINALYSIS, ROUTINE W REFLEX MICROSCOPIC
BILIRUBIN URINE: NEGATIVE
GLUCOSE, UA: NEGATIVE mg/dL
Hgb urine dipstick: NEGATIVE
KETONES UR: NEGATIVE mg/dL
Leukocytes, UA: NEGATIVE
Nitrite: NEGATIVE
PH: 7 (ref 5.0–8.0)
Protein, ur: NEGATIVE mg/dL
Specific Gravity, Urine: 1.018 (ref 1.005–1.030)

## 2015-11-12 LAB — RAPID URINE DRUG SCREEN, HOSP PERFORMED
AMPHETAMINES: NOT DETECTED
Barbiturates: NOT DETECTED
Benzodiazepines: NOT DETECTED
COCAINE: NOT DETECTED
OPIATES: NOT DETECTED
TETRAHYDROCANNABINOL: NOT DETECTED

## 2015-11-12 LAB — I-STAT CHEM 8, ED
BUN: 32 mg/dL — AB (ref 6–20)
CHLORIDE: 104 mmol/L (ref 101–111)
CREATININE: 1.5 mg/dL — AB (ref 0.61–1.24)
Calcium, Ion: 1.27 mmol/L (ref 1.13–1.30)
GLUCOSE: 111 mg/dL — AB (ref 65–99)
HEMATOCRIT: 36 % — AB (ref 39.0–52.0)
HEMOGLOBIN: 12.2 g/dL — AB (ref 13.0–17.0)
POTASSIUM: 4.2 mmol/L (ref 3.5–5.1)
Sodium: 143 mmol/L (ref 135–145)
TCO2: 27 mmol/L (ref 0–100)

## 2015-11-12 LAB — ETHANOL: Alcohol, Ethyl (B): <5 mg/dL (ref <5)

## 2015-11-12 LAB — CREATININE, SERUM
CREATININE: 1.57 mg/dL — AB (ref 0.61–1.24)
GFR calc Af Amer: 46 mL/min — ABNORMAL LOW (ref >60)
GFR, EST NON AFRICAN AMERICAN: 39 mL/min — AB (ref >60)

## 2015-11-12 LAB — PROTIME-INR
INR: 1.19 (ref 0.00–1.49)
Prothrombin Time: 15.3 seconds — ABNORMAL HIGH (ref 11.6–15.2)

## 2015-11-12 LAB — I-STAT TROPONIN, ED: Troponin i, poc: 0 ng/mL (ref 0.00–0.08)

## 2015-11-12 LAB — APTT: aPTT: 27 seconds (ref 24–37)

## 2015-11-12 LAB — D-DIMER, QUANTITATIVE: D-Dimer, Quant: <0.27 ug/mL-FEU (ref 0.00–0.50)

## 2015-11-12 MED ORDER — VENLAFAXINE HCL 37.5 MG PO TABS
75.0000 mg | ORAL_TABLET | Freq: Every day | ORAL | Status: DC
  Administered 2015-11-13 – 2015-11-16 (×4): 75 mg via ORAL
  Filled 2015-11-12: qty 1
  Filled 2015-11-12 (×3): qty 2
  Filled 2015-11-12 (×3): qty 1
  Filled 2015-11-12: qty 2

## 2015-11-12 MED ORDER — ASPIRIN 300 MG RE SUPP
300.0000 mg | Freq: Every day | RECTAL | Status: DC
  Administered 2015-11-12: 300 mg via RECTAL
  Filled 2015-11-12: qty 1

## 2015-11-12 MED ORDER — CETYLPYRIDINIUM CHLORIDE 0.05 % MT LIQD
7.0000 mL | Freq: Two times a day (BID) | OROMUCOSAL | Status: DC
  Administered 2015-11-13 – 2015-11-14 (×4): 7 mL via OROMUCOSAL

## 2015-11-12 MED ORDER — FENOFIBRATE 54 MG PO TABS
54.0000 mg | ORAL_TABLET | Freq: Every morning | ORAL | Status: DC
  Administered 2015-11-13 – 2015-11-16 (×4): 54 mg via ORAL
  Filled 2015-11-12 (×8): qty 1

## 2015-11-12 MED ORDER — GABAPENTIN 100 MG PO CAPS
200.0000 mg | ORAL_CAPSULE | Freq: Once | ORAL | Status: AC
  Administered 2015-11-12: 200 mg via ORAL
  Filled 2015-11-12: qty 2

## 2015-11-12 MED ORDER — FENOFIBRATE 145 MG PO TABS
145.0000 mg | ORAL_TABLET | Freq: Once | ORAL | Status: DC
Start: 2015-11-12 — End: 2015-11-12
  Filled 2015-11-12: qty 1

## 2015-11-12 MED ORDER — SODIUM CHLORIDE 0.9 % IV BOLUS (SEPSIS)
500.0000 mL | Freq: Once | INTRAVENOUS | Status: AC
  Administered 2015-11-12: 500 mL via INTRAVENOUS

## 2015-11-12 MED ORDER — FENOFIBRATE 160 MG PO TABS
160.0000 mg | ORAL_TABLET | Freq: Once | ORAL | Status: AC
  Administered 2015-11-12: 160 mg via ORAL
  Filled 2015-11-12: qty 1

## 2015-11-12 MED ORDER — ACETAMINOPHEN 325 MG PO TABS
650.0000 mg | ORAL_TABLET | ORAL | Status: DC | PRN
Start: 2015-11-12 — End: 2015-11-16
  Administered 2015-11-12: 650 mg via ORAL
  Filled 2015-11-12: qty 2

## 2015-11-12 MED ORDER — ASPIRIN 325 MG PO TABS
325.0000 mg | ORAL_TABLET | Freq: Every day | ORAL | Status: DC
  Administered 2015-11-13 – 2015-11-15 (×3): 325 mg via ORAL
  Filled 2015-11-12 (×3): qty 1

## 2015-11-12 MED ORDER — ASPIRIN EC 81 MG PO TBEC: 81.0000 mg | DELAYED_RELEASE_TABLET | Freq: Every day | ORAL | Status: DC

## 2015-11-12 MED ORDER — OXYBUTYNIN CHLORIDE 5 MG PO TABS
5.0000 mg | ORAL_TABLET | Freq: Two times a day (BID) | ORAL | Status: DC
  Administered 2015-11-12 – 2015-11-16 (×8): 5 mg via ORAL
  Filled 2015-11-12 (×8): qty 1

## 2015-11-12 MED ORDER — CHLORHEXIDINE GLUCONATE 0.12 % MT SOLN
15.0000 mL | Freq: Two times a day (BID) | OROMUCOSAL | Status: DC
  Administered 2015-11-12 – 2015-11-16 (×8): 15 mL via OROMUCOSAL
  Filled 2015-11-12 (×8): qty 15

## 2015-11-12 MED ORDER — SODIUM CHLORIDE 0.9 % IV SOLN
INTRAVENOUS | Status: DC
  Administered 2015-11-12 – 2015-11-15 (×4): via INTRAVENOUS

## 2015-11-12 MED ORDER — STROKE: EARLY STAGES OF RECOVERY BOOK
Freq: Once | Status: AC
  Administered 2015-11-12: 14:00:00
  Filled 2015-11-12: qty 1

## 2015-11-12 MED ORDER — HEPARIN SODIUM (PORCINE) 5000 UNIT/ML IJ SOLN
5000.0000 [IU] | Freq: Three times a day (TID) | INTRAMUSCULAR | Status: DC
Start: 2015-11-12 — End: 2015-11-16
  Administered 2015-11-12 – 2015-11-16 (×9): 5000 [IU] via SUBCUTANEOUS
  Filled 2015-11-12 (×9): qty 1

## 2015-11-12 NOTE — ED Notes (Signed)
Attempted report 

## 2015-11-12 NOTE — Telephone Encounter (Signed)
Pt had appt today with Amy Esterwood but is currently in ER for possible CVA. Will cc Amy to make her aware

## 2015-11-12 NOTE — Telephone Encounter (Signed)
Noted thanks °

## 2015-11-12 NOTE — ED Notes (Signed)
Wife reports PT had a MRI in January 2017 . Dr Jannifer Franklin was the ordering MD

## 2015-11-12 NOTE — ED Notes (Signed)
Patient transported to X-ray 

## 2015-11-12 NOTE — ED Provider Notes (Signed)
I saw and evaluated the patient, reviewed the resident's note and I agree with the findings and plan.  82 year old male with aphasia and speech difficulties last known normal 0200 today. No other complaints at this time. Exam with difficulty spelling world backwards and normally as well. Is disoriented to time and place but oriented to self. No other focal neuro deficits. Has a tremor which family says is his baseline. Did not attempt ambulation. Concern for CVA v infection (h/o infections). Will workup appropriately. Out of window for tPA, will d/w neurology if any possibility of other interventions.    Merrily Pew, MD 11/13/15 [phone]

## 2015-11-12 NOTE — Progress Notes (Signed)
Patient arrived from ED accompanied with family. Safety precautions and orders reviewed with patient/family at this time. Call light within reach. Alarm activated. TELE applied and confirmed. Will continue to monitor.   Ave Filter, RN

## 2015-11-12 NOTE — Telephone Encounter (Signed)
Spouse called to let you know [name] is at [place] now.  Possible stroke

## 2015-11-12 NOTE — ED Notes (Signed)
Patient transported to CT 

## 2015-11-12 NOTE — Evaluation (Signed)
Clinical/Bedside Swallow Evaluation Patient Details  Name: [name] MRN: [id number] Date of Birth: [birth year]  Today's Date: 11/12/2015 Time: SLP Start Time (ACUTE ONLY): [id number] SLP Stop Time (ACUTE ONLY): [id number] SLP Time Calculation (min) (ACUTE ONLY): 35 min  Past Medical History:  Past Medical History  Diagnosis Date  . GERD (gastroesophageal reflux disease)     h/o PUD  . CAD (coronary artery disease)     cath 2000 30% single vessel, normal nuclear stress test 12/03/2010, no evidence ischemia  . CKD (chronic kidney disease) stage 3, GFR 30-59 ml/min     baseline Cr 1.7  . Depression   . Hx pulmonary embolism 08/1991    negative hypercoagulable panel 12/2014  . History of phlebitis   . Allergic rhinitis   . Urge incontinence   . Elevated PSA     previous-normalized (followed by Dr. Jeffie Pollock, rec no repeat unless urinary sxs)  . HLD (hyperlipidemia)     hypertriglyceridemia  . Asthma     remote  . Arthritis   . Blood transfusion 1990's  . Schatzki's ring 02/2012    s/p dilation [name]  . History of fracture of right hip   . Hearing loss 06/2012    eval - rec annual exam  . Essential tremor 09/27/2008    reviewed eval by Dr Jannifer Franklin in chart 2011   . Prediabetes   . DISH (diffuse idiopathic skeletal hyperostosis) 03/2013    lumbar spine on xray  . DDD (degenerative disc disease) 03/2013    by CT, diffuse multilevel cervical and lumbar spondylosis  . Osteoporosis 11/2013    DEXA T score -2.7  . Macrocytic anemia 2014    stable B12/folate and periph smear 05/2013, again periph smear 2017 with mature cells, mild dacrocytosis  . Sleep apnea     no CPAP- sleep apnea "cleared up 15 years ago"  . Dizziness     multifactorial, s/p PT at [facility] 06/2014 with HEP  . History of pyelonephritis 05/2014    hospitalization with sepsis  . Closed C7 fracture ([place]) 11/06/2014  . UTI (urinary tract infection) 05/13/2015  . UTI (urinary tract infection) due to Enterococcus 05/13/2015  . Candidal  urethritis in male 06/15/2015   Past Surgical History:  Past Surgical History  Procedure Laterality Date  . Tonsillectomy  1965  . Hernia repair  1979    Left  . Cystoscopy  1992    for kidney stones  . Exploratory laparotomy  1996    with incidental appendectomy  . Appendectomy  1996  . Knee arthroscopy  03/24/02    Right (Dr. Mauri Pole)  . Orif femoral neck fracture w/ dhs  08/03/02    Right (Dr. Mauri Pole)  . Ct abd w & pelvis wo cm  07/2001    Scarring of right lung, stable negative o/w  . Ct abd w & pelvis wo cm  11/2000    ? stones, right LL scarring  . V/q scan  06/1999    negative  . [place]oscopy  1998    [place]  . US echocardiography  12/28/2007    Mild aortic valve calcification EF 55%, basically nml  . Carotid u/s  12/28/2007    nml  . Eeg  11/12/2009    nml  . Mri  11/2009    Head, nml  . Cataract extraction  Jan, March 2011    bilateral  . Colonoscopy  07/2010    5 polyps, adenomatous, rec rpt 3 yrs  . Esophagogastroduodenoscopy  02/2012    dilation of schatzki's ring [name]  . Upper gastrointestinal endoscopy    . Colonoscopy  04/2014    3 polyps, adenomatous, f/u open ended given age [name]  . Cardiac catheterization  2000    30% one vessel  . Cardiovascular stress test  03/2015    no ischemia, low risk, EF 76%   HPI:  82 y.o. male who presents with acute onset of transient speech difficulty. PMH: hyperlipidemia, Schatzki's ring, essential tremor, sleep apnea, chronic kidney disease, esophageal stretching x3 (was scheduled to visit with GI 3/6), essential tremor, DISH, and dysphagia (previous MBS in 2013 recommended a regular diet and thin liquids with intermittent penetration of thin liquids that cleared spontaneously, BSE in 11/2014 suggestive of similar function).   Assessment / Plan / Recommendation Clinical Impression  Pt's oropharyngeal swallow appears to be at his baseline level of function, which does include a h/o dysphagia. Multiple, audible  swallows observed particularly with thin liquids, which may likely be related to h/o DISH and/or esophageal issues. Delayed throat clearing may be indicate of esophageal component as well, as pt and wife share that he was supposed to have an appointment at his GI office today to determine if his esophagus needed to be stretched again. No overt coughing or changes in vocal quality observed, and pt/wife believe that his symptoms upon admission have greatly improved. Recommend to start regular diet and thin liquids to allow pt/wife the ability to select foods that he can typically tolerate at home. Will f/u briefly as he remains in house.    Aspiration Risk  Mild aspiration risk;Moderate aspiration risk    Diet Recommendation Regular;Thin liquid   Liquid Administration via: Straw;Cup Medication Administration: Whole meds with puree Supervision: Patient able to self feed;Staff to assist with self feeding;Full supervision/cueing for compensatory strategies Compensations: Slow rate;Small sips/bites;Follow solids with liquid Postural Changes: Seated upright at 90 degrees;Remain upright for at least 30 minutes after po intake    Other  Recommendations Recommended Consults: Consider GI evaluation (pt was scheduled to see GI today) Oral Care Recommendations: Oral care BID   Follow up Recommendations  None    Frequency and Duration min 1 x/week  1 week       Prognosis        Swallow Study   General HPI: 82 y.o. male who presents with acute onset of transient speech difficulty. PMH: hyperlipidemia, Schatzki's ring, essential tremor, sleep apnea, chronic kidney disease, esophageal stretching x3 (was scheduled to visit with GI 3/6), essential tremor, DISH, and dysphagia (previous MBS in 2013 recommended a regular diet and thin liquids with intermittent penetration of thin liquids that cleared spontaneously, BSE in 11/2014 suggestive of similar function). Type of Study: Bedside Swallow  Evaluation Previous Swallow Assessment: see HPI Diet Prior to this Study: NPO Temperature Spikes Noted: No Respiratory Status: Room air History of Recent Intubation: No Behavior/Cognition: Alert;Cooperative;Pleasant mood;Requires cueing Oral Cavity Assessment: Within Functional Limits Oral Care Completed by SLP: No Oral Cavity - Dentition: Adequate natural dentition Vision: Functional for self-feeding Self-Feeding Abilities: Able to feed self;Needs assist (essential tremor - uses adaptive utensils at home) Patient Positioning: Upright in bed Baseline Vocal Quality: Low vocal intensity (mildly reduced) Volitional Cough: Weak Volitional Swallow: Able to elicit    Oral/Motor/Sensory Function Overall Oral Motor/Sensory Function: Within functional limits   Ice Chips Ice chips: Not tested   Thin Liquid Thin Liquid: Impaired Presentation: Cup;Self Fed;Straw Pharyngeal  Phase Impairments: Suspected delayed Swallow;Multiple swallows;Throat Clearing - Delayed  Nectar Thick Nectar Thick Liquid: Not tested   Honey Thick Honey Thick Liquid: Not tested   Puree Puree: Impaired Presentation: Spoon Pharyngeal Phase Impairments: Throat Clearing - Delayed   Solid   GO   Solid: Impaired Pharyngeal Phase Impairments: Throat Clearing - Delayed       Germain Osgood, M.A. CCC-SLP [phone]  Germain Osgood 11/12/2015,5:15 PM

## 2015-11-12 NOTE — ED Provider Notes (Signed)
CSN: [id number]     Arrival date & time 11/12/15  0705 History   First MD Initiated Contact with Patient 11/12/15 [phone]     Chief Complaint  Patient presents with  . Cerebrovascular Accident     (Consider location/radiation/quality/duration/timing/severity/associated sxs/prior Treatment) HPI   [name] is an 82 year old man with a PMH of mild cognitive impairment, essential tremor, history of PE (previously on warfarin), previous cervical spine fracture 2/2 who presents with new onset speech difficulties. His last known baseline status was at approximately 2AM last night. His wife was present for corroboration, as he had difficulty expressing himself When he woke up with morning, his wife noticed that he had difficulty expressing himself. At baseline, he has difficulty ambulating and needs a walker and has an intention tremor. There is no new onset weakness or changes in sensation. No vision changes.     Past Medical History  Diagnosis Date  . GERD (gastroesophageal reflux disease)     h/o PUD  . CAD (coronary artery disease)     cath 2000 30% single vessel, normal nuclear stress test 12/03/2010, no evidence ischemia  . CKD (chronic kidney disease) stage 3, GFR 30-59 ml/min     baseline Cr 1.7  . Depression   . Hx pulmonary embolism 08/1991    negative hypercoagulable panel 12/2014  . History of phlebitis   . Allergic rhinitis   . Urge incontinence   . Elevated PSA     previous-normalized (followed by Dr. Jeffie Pollock, rec no repeat unless urinary sxs)  . HLD (hyperlipidemia)     hypertriglyceridemia  . Asthma     remote  . Arthritis   . Blood transfusion 1990's  . Schatzki's ring 02/2012    s/p dilation [name]  . History of fracture of right hip   . Hearing loss 06/2012    eval - rec annual exam  . Essential tremor 09/27/2008    reviewed eval by Dr Jannifer Franklin in chart 2011   . Prediabetes   . DISH (diffuse idiopathic skeletal hyperostosis) 03/2013    lumbar spine on xray  . DDD  (degenerative disc disease) 03/2013    by CT, diffuse multilevel cervical and lumbar spondylosis  . Osteoporosis 11/2013    DEXA T score -2.7  . Macrocytic anemia 2014    stable B12/folate and periph smear 05/2013, again periph smear 2017 with mature cells, mild dacrocytosis  . Sleep apnea     no CPAP- sleep apnea "cleared up 15 years ago"  . Dizziness     multifactorial, s/p PT at [facility] 06/2014 with HEP  . History of pyelonephritis 05/2014    hospitalization with sepsis  . Closed C7 fracture ([place]) 11/06/2014  . UTI (urinary tract infection) 05/13/2015  . UTI (urinary tract infection) due to Enterococcus 05/13/2015  . Candidal urethritis in male 06/15/2015   Past Surgical History  Procedure Laterality Date  . Tonsillectomy  1965  . Hernia repair  1979    Left  . Cystoscopy  1992    for kidney stones  . Exploratory laparotomy  1996    with incidental appendectomy  . Appendectomy  1996  . Knee arthroscopy  03/24/02    Right (Dr. Mauri Pole)  . Orif femoral neck fracture w/ dhs  08/03/02    Right (Dr. Mauri Pole)  . Ct abd w & pelvis wo cm  07/2001    Scarring of right lung, stable negative o/w  . Ct abd w & pelvis wo cm  11/2000    ?  stones, right LL scarring  . V/q scan  06/1999    negative  . Colonoscopy  1998    [place]  . US echocardiography  12/28/2007    Mild aortic valve calcification EF 55%, basically nml  . Carotid u/s  12/28/2007    nml  . Eeg  11/12/2009    nml  . Mri  11/2009    Head, nml  . Cataract extraction  Jan, March 2011    bilateral  . Colonoscopy  07/2010    5 polyps, adenomatous, rec rpt 3 yrs  . Esophagogastroduodenoscopy  02/2012    dilation of schatzki's ring [name]  . Upper gastrointestinal endoscopy    . Colonoscopy  04/2014    3 polyps, adenomatous, f/u open ended given age [name]  . Cardiac catheterization  2000    30% one vessel  . Cardiovascular stress test  03/2015    no ischemia, low risk, EF 76%   Family History  Problem Relation Age of  Onset  . Stroke Mother   . Hypertension Mother   . Cancer Brother     prostate with mets  . Blindness Brother     legally  . Diabetes Brother   . Pulmonary embolism Sister     from shoulder operation  . Alcohol abuse Brother   . Colon cancer Neg Hx   . Esophageal cancer Neg Hx   . Rectal cancer Neg Hx   . Stomach cancer Neg Hx    Social History  Substance Use Topics  . Smoking status: Never Smoker   . Smokeless tobacco: Never Used  . Alcohol Use: No    Review of Systems  Constitutional: Negative for fever and chills.  HENT: Negative for congestion and sore throat.   Eyes: Negative for photophobia and visual disturbance.  Respiratory: Negative for cough and shortness of breath.   Cardiovascular: Negative for chest pain and leg swelling.  Gastrointestinal: Negative for abdominal pain and diarrhea.  Endocrine: Negative for cold intolerance and polyuria.  Genitourinary: Negative for dysuria, urgency and difficulty urinating.  Musculoskeletal: Negative for arthralgias and gait problem.  Skin: Negative for pallor and rash.  Neurological: Positive for tremors and speech difficulty. Negative for dizziness.  Psychiatric/Behavioral: Negative for hallucinations and agitation.      Allergies  Lactose intolerance (gi); Nizatidine; Sulfadiazine; Aricept; Lipitor; and Penicillins  Home Medications   Prior to Admission medications   Medication Sig Start Date End Date Taking? Authorizing Provider  aspirin EC 81 MG tablet Take 81 mg by mouth daily.   Yes Historical Provider, MD  Biotin 2500 MCG CAPS Take 1 capsule by mouth daily.   Yes Historical Provider, MD  Cholecalciferol (VITAMIN D) 2000 UNITS CAPS Take 1 capsule by mouth daily.   Yes Historical Provider, MD  clotrimazole (LOTRIMIN) 1 % cream Apply 1 application topically 2 (two) times daily. Patient taking differently: Apply 1 application topically 2 (two) times daily as needed. For rash 05/02/15  Yes Pleas Koch, NP   Coenzyme Q10 (COQ-10) 100 MG CAPS Take 1 capsule by mouth daily.    Yes Historical Provider, MD  Cyanocobalamin (B-12 PO) Take 10,000 mcg by mouth every Monday, Wednesday, and Friday.    Yes Historical Provider, MD  dexlansoprazole (DEXILANT) 60 MG capsule Take 1 capsule (60 mg total) by mouth daily. 11/01/15  Yes Milus Banister, MD  docusate sodium (COLACE) 100 MG capsule Take 100 mg by mouth at bedtime.    Yes Historical Provider, MD  doxycycline (  VIBRA-TABS) 100 MG tablet Take 1 tablet (100 mg total) by mouth 2 (two) times daily. 10/31/15  Yes Pleas Koch, NP  fenofibrate (TRICOR) 145 MG tablet TAKE 1 TABLET DAILY 08/23/15  Yes Ria Bush, MD  fexofenadine (ALLEGRA) 180 MG tablet Take 180 mg by mouth daily.    Yes Historical Provider, MD  gabapentin (NEURONTIN) 100 MG capsule Take 2 capsules (200 mg total) by mouth 3 (three) times daily. Patient taking differently: Take 200-300 mg by mouth 2 (two) times daily. Take 3 capsules in the morning Take 2 capsules in the evening 09/21/15  Yes Kathrynn Ducking, MD  Multiple Vitamins-Minerals (MULTIVITAMIN PO) Take by mouth daily.   Yes Historical Provider, MD  oxybutynin (DITROPAN) 5 MG tablet Take 5 mg by mouth 2 (two) times daily.    Yes Historical Provider, MD  venlafaxine (EFFEXOR) 75 MG tablet Take 75 mg by mouth daily.   Yes Historical Provider, MD  albuterol (PROVENTIL HFA;VENTOLIN HFA) 108 (90 BASE) MCG/ACT inhaler Inhale 2 puffs into the lungs every 6 (six) hours as needed for wheezing or shortness of breath.    Historical Provider, MD  guaiFENesin (MUCINEX) 600 MG 12 hr tablet Take 1 tablet (600 mg total) by mouth 2 (two) times daily. Patient not taking: Reported on 11/12/2015 11/09/14   Reyne Dumas, MD   BP 95/50 mmHg  Pulse 68  Temp(Src) 98.4 F (36.9 C) (Oral)  Resp 14  Ht 5\' 6"  (1.676 m)  Wt 78.472 kg  BMI 27.94 kg/m2  SpO2 100% Physical Exam  Constitutional: He appears well-developed and well-nourished. No distress.   HENT:  Mouth/Throat: Oropharynx is clear and moist. No oropharyngeal exudate.  Eyes: EOM are normal. Pupils are equal, round, and reactive to light. No scleral icterus.  Neck: Normal range of motion. Neck supple.  Cardiovascular: Normal rate, regular rhythm and normal heart sounds.   Pulmonary/Chest: Effort normal and breath sounds normal. No respiratory distress. He has no wheezes.  Abdominal: Soft. Bowel sounds are normal. He exhibits no distension. There is no tenderness.  Lymphadenopathy:    He has no cervical adenopathy.  Neurological:  AAOx4. Difficulty repeating short phrases. Good comprehension, but difficulty verbalizing. 5/5 strength in UE, symmetric 4+/5 strength in lower extremities. Intention tremor on FTN. EOMI, tongue midline, face symmetric. Sensation to light touch in tact throughout. No visual field cut. Symmetric shoulder shrug.   Skin: Skin is warm and dry. He is not diaphoretic.  Psychiatric: He has a normal mood and affect. His behavior is normal.    ED Course  Procedures (including critical care time) Labs Review Labs Reviewed  PROTIME-INR - Abnormal; Notable for the following:    Prothrombin Time 15.3 (*)    All other components within normal limits  CBC - Abnormal; Notable for the following:    RBC 3.15 (*)    Hemoglobin 11.8 (*)    HCT 35.2 (*)    MCV 111.7 (*)    MCH 37.5 (*)    Platelets 127 (*)    All other components within normal limits  COMPREHENSIVE METABOLIC PANEL - Abnormal; Notable for the following:    Glucose, Bld 119 (*)    BUN 29 (*)    Creatinine, Ser 1.52 (*)    Total Protein 6.2 (*)    GFR calc non Af Amer 41 (*)    GFR calc Af Amer 47 (*)    All other components within normal limits  I-STAT CHEM 8, ED - Abnormal; Notable for the  following:    BUN 32 (*)    Creatinine, Ser 1.50 (*)    Glucose, Bld 111 (*)    Hemoglobin 12.2 (*)    HCT 36.0 (*)    All other components within normal limits  ETHANOL  APTT  DIFFERENTIAL  URINE  RAPID DRUG SCREEN, HOSP PERFORMED  URINALYSIS, ROUTINE W REFLEX MICROSCOPIC (NOT AT [facility])  I-STAT TROPOININ, ED   CMP Latest Ref Rng 11/12/2015 11/12/2015 10/03/2015  Glucose 65 - 99 mg/dL 111(H) 119(H) 123(H)  BUN 6 - 20 mg/dL 32(H) 29(H) 23  Creatinine 0.61 - 1.24 mg/dL 1.50(H) 1.52(H) 1.50  Sodium 135 - 145 mmol/L 143 142 142  Potassium 3.5 - 5.1 mmol/L 4.2 4.3 4.7  Chloride 101 - 111 mmol/L 104 107 106  CO2 22 - 32 mmol/L - 25 29  Calcium 8.9 - 10.3 mg/dL - 9.6 9.5  Total Protein 6.5 - 8.1 g/dL - 6.2(L) 6.6  Total Bilirubin 0.3 - 1.2 mg/dL - 1.2 1.1  Alkaline Phos 38 - 126 U/L - 52 44  AST 15 - 41 U/L - 30 30  ALT 17 - 63 U/L - 24 23   CBC Latest Ref Rng 11/12/2015 11/12/2015 10/03/2015  WBC 4.0 - 10.5 K/uL - 5.8 5.5  Hemoglobin 13.0 - 17.0 g/dL 12.2(L) 11.8(L) 12.2(L)  Hematocrit 39.0 - 52.0 % 36.0(L) 35.2(L) 36.1(L)  Platelets 150 - 400 K/uL - 127(L) 170.0      Troponin (Point of Care Test)  Recent Labs  11/12/15 0748  TROPIPOC 0.00     Imaging Review Ct Head Wo Contrast  11/12/2015  CLINICAL DATA:  Aphasia, confusion EXAM: CT HEAD WITHOUT CONTRAST TECHNIQUE: Contiguous axial images were obtained from the base of the skull through the vertex without intravenous contrast. COMPARISON:  11/06/2014 FINDINGS: No skull fracture is noted. Paranasal sinuses and mastoid air cells are unremarkable. No intracranial hemorrhage, mass effect or midline shift. Stable atrophy and chronic white matter disease. No definite acute cortical infarction. No mass lesion is noted on this unenhanced scan. Ventricular size is stable from prior exam. IMPRESSION: No acute intracranial abnormality. Stable atrophy and chronic white matter disease. No definite acute cortical infarction. Electronically Signed   By: Lahoma Crocker M.D.   On: 11/12/2015 08:37   I have personally reviewed and evaluated these images and lab results as part of my medical decision-making.   EKG Interpretation None      MDM    Clinical Impression: Aphasia  Expressive aphasia noted on exam, no other focal neuro deficits. Speech deficits have somewhat improved per the wife. He is outside of the window for neurointervention or thrombolysis. Will obtain non-contrast CT, UA, UDS, CBC, CMET. Neurology was consulted.   CT shows no acute findings. I spoke with Dr. Wallie Char from Neurology, who will be consulting on the patient. Will admit to [facility] - Dr. Marily Memos.   Liberty Handy, MD 11/12/15 [place], MD 11/12/15 [place], MD 11/13/15 [phone]

## 2015-11-12 NOTE — Telephone Encounter (Signed)
Ok, thanks for heads up

## 2015-11-12 NOTE — Progress Notes (Signed)
RN attempted to call for report, ED RN will call back.

## 2015-11-12 NOTE — H&P (Signed)
[facility] History and Physical  [name] [id number] DOB: 11/29/1930 DOA: 11/12/2015  Referring physician:  PCP: Ria Bush, MD   Chief Complaint: New onset speech difficulty  HPI: [name] is a 82 y.o. male  history of  Hyperlipidemia andd prior history of DVT/PE in 1993, currently on ASA presenting with new onset expressive aphasia with confusion, last known normal at 2 am today. No prior history of TIA.  Patient is oriented to self but not to time and place. Denies dizziness or new gait difficulty. He has chronic resting tremors, worse over the last 3 years. Denies any  focal extremity weakness or  numbness. Patient's wife denies any prior history of TIAs He takes ASA daily. He does not take any other anticoagulants than ASA. No recent trauma. Denies any sick contacts, but had a recent sacral ulcer for which he was taking doxycyline. No ETOH or tobacco use. No bleeding issues. No recent hospitalizations.   At the ED CT of the head without contrast was negative for acute intracranial abnormalities. Recent MRI on 10/02/2015 taken for tremor evaluation, was negative for acute intracranial abnormalities as well. Not TPA candidate as he surpassed the window for therapy. EKG and CXR pending  Neurology consulted.   Review of Systems: Unable to obtain from patient. Per wife report:  Constitutional:  No weight loss, night sweats, fevers, chills, fatigue HEENT: No headaches, difficulty swallowing,tooth/dental problems, sore throat Cardio-vascular:  No chest pain, orthopnea, PND, swelling in lower extremities, anasarca, dizziness, palpitations  GI:  No heartburn, indigestion, abdominal pain, nausea, vomiting, diarrhea, change in bowel habits, loss of appetite  Respiratory:  No shortness of breath with exertion or at rest. No excess mucus, no productive cough, No non-productive cough, No coughing up of blood. No change in color of mucus. No wheezing .No chest wall  deformity  Skin:  No rash or lesions.  GU:  no dysuria, change in color of urine, no urgency or frequency. No flank pain.  Musculoskeletal:   No joint pain or swelling. No decreased range of motion. No back pain.  Psych:  No change in mood or affect. No depression or anxiety. No recent memory loss.  Neuro:  No change in sensation, unilateral strength, expressive aphasia as above, new .  All other systems were reviewed and are negative.  Past Medical History  Diagnosis Date  . GERD (gastroesophageal reflux disease)     h/o PUD  . CAD (coronary artery disease)     cath 2000 30% single vessel, normal nuclear stress test 12/03/2010, no evidence ischemia  . CKD (chronic kidney disease) stage 3, GFR 30-59 ml/min     baseline Cr 1.7  . Depression   . Hx pulmonary embolism 08/1991    negative hypercoagulable panel 12/2014  . History of phlebitis   . Allergic rhinitis   . Urge incontinence   . Elevated PSA     previous-normalized (followed by Dr. Jeffie Pollock, rec no repeat unless urinary sxs)  . HLD (hyperlipidemia)     hypertriglyceridemia  . Asthma     remote  . Arthritis   . Blood transfusion 1990's  . Schatzki's ring 02/2012    s/p dilation [name]  . History of fracture of right hip   . Hearing loss 06/2012    eval - rec annual exam  . Essential tremor 09/27/2008    reviewed eval by Dr Jannifer Franklin in chart 2011   . Prediabetes   . DISH (diffuse idiopathic skeletal hyperostosis) 03/2013  lumbar spine on xray  . DDD (degenerative disc disease) 03/2013    by CT, diffuse multilevel cervical and lumbar spondylosis  . Osteoporosis 11/2013    DEXA T score -2.7  . Macrocytic anemia 2014    stable B12/folate and periph smear 05/2013, again periph smear 2017 with mature cells, mild dacrocytosis  . Sleep apnea     no CPAP- sleep apnea "cleared up 15 years ago"  . Dizziness     multifactorial, s/p PT at [facility] 06/2014 with HEP  . History of pyelonephritis 05/2014    hospitalization with sepsis   . Closed C7 fracture ([place]) 11/06/2014  . UTI (urinary tract infection) 05/13/2015  . UTI (urinary tract infection) due to Enterococcus 05/13/2015  . Candidal urethritis in male 06/15/2015   Past Surgical History  Procedure Laterality Date  . Tonsillectomy  1965  . Hernia repair  1979    Left  . Cystoscopy  1992    for kidney stones  . Exploratory laparotomy  1996    with incidental appendectomy  . Appendectomy  1996  . Knee arthroscopy  03/24/02    Right (Dr. Mauri Pole)  . Orif femoral neck fracture w/ dhs  08/03/02    Right (Dr. Mauri Pole)  . Ct abd w & pelvis wo cm  07/2001    Scarring of right lung, stable negative o/w  . Ct abd w & pelvis wo cm  11/2000    ? stones, right LL scarring  . V/q scan  06/1999    negative  . [place]oscopy  1998    [place]  . US echocardiography  12/28/2007    Mild aortic valve calcification EF 55%, basically nml  . Carotid u/s  12/28/2007    nml  . Eeg  11/12/2009    nml  . Mri  11/2009    Head, nml  . Cataract extraction  Jan, March 2011    bilateral  . Colonoscopy  07/2010    5 polyps, adenomatous, rec rpt 3 yrs  . Esophagogastroduodenoscopy  02/2012    dilation of schatzki's ring [name]  . Upper gastrointestinal endoscopy    . Colonoscopy  04/2014    3 polyps, adenomatous, f/u open ended given age [name]  . Cardiac catheterization  2000    30% one vessel  . Cardiovascular stress test  03/2015    no ischemia, low risk, EF 76%   Social History:  reports that he has never smoked. He has never used smokeless tobacco. He reports that he does not drink alcohol or use illicit drugs.  Allergies  Allergen Reactions  . Lactose Intolerance (Gi)   . Nizatidine     REACTION: Rash (Axid)  . Sulfadiazine     REACTION: Hives  . Aricept [Donepezil Hcl] Rash  . Lipitor [Atorvastatin] Rash  . Penicillins Rash    Has patient had a PCN reaction causing immediate rash, facial/tongue/throat swelling, SOB or lightheadedness with hypotension: No Has  patient had a PCN reaction causing severe rash involving mucus membranes or skin necrosis: No Has patient had a PCN reaction that required hospitalization No Has patient had a PCN reaction occurring within the last 10 years: No If all of the above answers are "NO",  then may proceed with Cephalosporin use.    Family History  Problem Relation Age of Onset  . Stroke Mother   . Hypertension Mother   . Cancer Brother     prostate with mets  . Blindness Brother     legally  .  Diabetes Brother   . Pulmonary embolism Sister     from shoulder operation  . Alcohol abuse Brother   . Colon cancer Neg Hx   . Esophageal cancer Neg Hx   . Rectal cancer Neg Hx   . Stomach cancer Neg Hx      Prior to Admission medications   Medication Sig Start Date End Date Taking? Authorizing Provider  aspirin EC 81 MG tablet Take 81 mg by mouth daily.   Yes Historical Provider, MD  Biotin 2500 MCG CAPS Take 1 capsule by mouth daily.   Yes Historical Provider, MD  Cholecalciferol (VITAMIN D) 2000 UNITS CAPS Take 1 capsule by mouth daily.   Yes Historical Provider, MD  clotrimazole (LOTRIMIN) 1 % cream Apply 1 application topically 2 (two) times daily. Patient taking differently: Apply 1 application topically 2 (two) times daily as needed. For rash 05/02/15  Yes Pleas Koch, NP  Coenzyme Q10 (COQ-10) 100 MG CAPS Take 1 capsule by mouth daily.    Yes Historical Provider, MD  Cyanocobalamin (B-12 PO) Take 10,000 mcg by mouth every Monday, Wednesday, and Friday.    Yes Historical Provider, MD  dexlansoprazole (DEXILANT) 60 MG capsule Take 1 capsule (60 mg total) by mouth daily. 11/01/15  Yes Milus Banister, MD  docusate sodium (COLACE) 100 MG capsule Take 100 mg by mouth at bedtime.    Yes Historical Provider, MD  doxycycline (VIBRA-TABS) 100 MG tablet Take 1 tablet (100 mg total) by mouth 2 (two) times daily. 10/31/15  Yes Pleas Koch, NP  fenofibrate (TRICOR) 145 MG tablet TAKE 1 TABLET DAILY 08/23/15   Yes Ria Bush, MD  fexofenadine (ALLEGRA) 180 MG tablet Take 180 mg by mouth daily.    Yes Historical Provider, MD  gabapentin (NEURONTIN) 100 MG capsule Take 2 capsules (200 mg total) by mouth 3 (three) times daily. Patient taking differently: Take 200-300 mg by mouth 2 (two) times daily. Take 3 capsules in the morning Take 2 capsules in the evening 09/21/15  Yes Kathrynn Ducking, MD  Multiple Vitamins-Minerals (MULTIVITAMIN PO) Take by mouth daily.   Yes Historical Provider, MD  oxybutynin (DITROPAN) 5 MG tablet Take 5 mg by mouth 2 (two) times daily.    Yes Historical Provider, MD  venlafaxine (EFFEXOR) 75 MG tablet Take 75 mg by mouth daily.   Yes Historical Provider, MD  albuterol (PROVENTIL HFA;VENTOLIN HFA) 108 (90 BASE) MCG/ACT inhaler Inhale 2 puffs into the lungs every 6 (six) hours as needed for wheezing or shortness of breath.    Historical Provider, MD  guaiFENesin (MUCINEX) 600 MG 12 hr tablet Take 1 tablet (600 mg total) by mouth 2 (two) times daily. Patient not taking: Reported on 11/12/2015 11/09/14   Reyne Dumas, MD   Physical Exam: Filed Vitals:   11/12/15 0727 11/12/15 0730 11/12/15 0800 11/12/15 0930  BP: 121/50 115/53 95/50   Pulse: 70 71 70 68  Temp: 98.4 F (36.9 C)     TempSrc: Oral     Resp: 18  17 14   Height: 5\' 6"  (1.676 m)     Weight: 78.472 kg (173 lb)     SpO2: 100% 99% 100% 100%    Wt Readings from Last 3 Encounters:  11/12/15 78.472 kg (173 lb)  10/31/15 78.382 kg (172 lb 12.8 oz)  09/21/15 78.472 kg (173 lb)    General: Appears calm and comfortable, arouses easily to sound Eyes:  PERRL, EOMI, normal lids, iris ENT: grossly normal hearing, lips &  tongue Neck: no lymphadenopathy, masses or thyromegaly Cardiovascular: regular rate and rythm, no murmurs, rubs or gallops. No lower extremity edema   Respiratory: clear to auscultation bilaterally, no wheezing, rhonchi or rales. Normal respiratory effort. Abdomen: soft,non-tender, normal bowel  sounds Skin: no rash or induration seen on limited exam. No open lesions. Healing sacral wound around perianal area Musculoskeletal:  grossly normal tone in both upper and lower extremities Psychiatric: grossly normal mood and affect, dysarthria improving Neurologic: CN 2-12 grossly intact, moves all extremities in coordinated fashion. Benign essential tremor Dysarthria noted. No tongue deviation. Uvula midline. Follows simple commands. Not oriented to date and year.           Labs on Admission:  Basic Metabolic Panel:  Recent Labs Lab 11/12/15 0747 11/12/15 0750  NA 142 143  K 4.3 4.2  CL 107 104  CO2 25  --   GLUCOSE 119* 111*  BUN 29* 32*  CREATININE 1.52* 1.50*  CALCIUM 9.6  --     Liver Function Tests:  Recent Labs Lab 11/12/15 0747  AST 30  ALT 24  ALKPHOS 52  BILITOT 1.2  PROT 6.2*  ALBUMIN 3.5   No results for input(s): LIPASE, AMYLASE in the last 168 hours. No results for input(s): AMMONIA in the last 168 hours.  CBC:  Recent Labs Lab 11/12/15 0747 11/12/15 0750  WBC 5.8  --   NEUTROABS 3.4  --   HGB 11.8* 12.2*  HCT 35.2* 36.0*  MCV 111.7*  --   PLT 127*  --      Radiological Exams on Admission: Ct Head Wo Contrast  11/12/2015  CLINICAL DATA:  Aphasia, confusion EXAM: CT HEAD WITHOUT CONTRAST TECHNIQUE: Contiguous axial images were obtained from the base of the skull through the vertex without intravenous contrast. COMPARISON:  11/06/2014 FINDINGS: No skull fracture is noted. Paranasal sinuses and mastoid air cells are unremarkable. No intracranial hemorrhage, mass effect or midline shift. Stable atrophy and chronic white matter disease. No definite acute cortical infarction. No mass lesion is noted on this unenhanced scan. Ventricular size is stable from prior exam. IMPRESSION: No acute intracranial abnormality. Stable atrophy and chronic white matter disease. No definite acute cortical infarction. Electronically Signed   By: Lahoma Crocker M.D.   On:  11/12/2015 08:37    EKG: Independently reviewed.    Assessment/Plan Principal Problem:   Stroke [facility]) Active Problems:   Aphasia   CHF (congestive heart failure) (HCC)   Hypotension   Benign essential tremor   B12 deficiency   Strokelike symptoms/slurred speech/aphasia  Symptoms improved at time of admission. Factors include HLD, prior history of PA/DVT in 1993, sedentary lifestyle. No indication for Wernicke's aphasia due to absence of risk factors for ETOH CT without acute abnormality.   Admit to telemetry Heparin  Bed rest 0-30 degree angle and air overlay mattress D-Dimer. If positive, will proceed with LE dopplers, rule out DVT  2 D Echo with bubble study with contrast  MRI of the brain withMRA head and neck  lipid panel  Hemoglobin A1c Monitor blood pressures PT/ OT/ speech consult Bedside swallow eval   Social work consult for possible SNF placement after discharge  Chronic diastolic heart failure, appears compensated. Last 2 D Echo on 11/07/14 with Grade 1 DD and vigorous systolic function EF [id number] New 2 D echo ordered as above  Daily weights, strict I/O  O2 to maintain O sats >90%  Hypotension This is chronic  IVF to maintain BP above 100, with  500 cc NS bolus followed by 50 cc/h No BP meds  History of sacral wound This was treated with doxycycline as OP Bactroban topical Wound care consult  Will follow   B12 deficiency Will continue B12 as scheduled.   Code Status: Full Code  DVT Prophylaxis: Heparin Family Communication: at bedside Disposition Plan: Pending Improvement. Admitted for observation in tele bed. Expected LOS 24-48 hrs    [facility] E,PA-C [facility] [email or web address] Password [email or web address]

## 2015-11-12 NOTE — ED Notes (Signed)
Pt and wife report at 0600 pt woke up and was confused and speech was incoherent . Wife reports she helped him to the bathroom at 2AM . Wife reports Pt was speaking clearly and alert. Wife reports she thinks he has had TIA's . Pt also has had multiple falls.  PT is alert now .

## 2015-11-12 NOTE — Progress Notes (Signed)
RN paged speech for for swallow eval. Awaiting for call back. RN unable to restart IV d/t hard stick. IV team consult.   Anysia Choi, Rn

## 2015-11-12 NOTE — Consult Note (Signed)
Admission H&P    Chief Complaint: New onset transient speech difficulty.  HPI: [name] is an 82 y.o. male with a history of hyperlipidemia, Schatzki's ring, essential tremor, sleep apnea and chronic kidney disease, brought to the emergency room following new onset of speech output difficulty. Patient was last known well at 2 AM today. When he woke up at 8 AM his wife noticed that his speech was unintelligible. Speech has improved since his arrived in the emergency room. There's no previous history of stroke nor TIA. Patient has been taking aspirin daily. CT scan of his head showed stable atrophy and chronic white matter changes, but no acute abnormality. No weakness or numbness involving face or extremities was experienced.  LSN: 2:00 AM on 11/12/2015 tPA Given: No: Deficit resolved mRankin:  Past Medical History  Diagnosis Date  . GERD (gastroesophageal reflux disease)     h/o PUD  . CAD (coronary artery disease)     cath 2000 30% single vessel, normal nuclear stress test 12/03/2010, no evidence ischemia  . CKD (chronic kidney disease) stage 3, GFR 30-59 ml/min     baseline Cr 1.7  . Depression   . Hx pulmonary embolism 08/1991    negative hypercoagulable panel 12/2014  . History of phlebitis   . Allergic rhinitis   . Urge incontinence   . Elevated PSA     previous-normalized (followed by Dr. Jeffie Pollock, rec no repeat unless urinary sxs)  . HLD (hyperlipidemia)     hypertriglyceridemia  . Asthma     remote  . Arthritis   . Blood transfusion 1990's  . Schatzki's ring 02/2012    s/p dilation [name]  . History of fracture of right hip   . Hearing loss 06/2012    eval - rec annual exam  . Essential tremor 09/27/2008    reviewed eval by Dr Jannifer Franklin in chart 2011   . Prediabetes   . DISH (diffuse idiopathic skeletal hyperostosis) 03/2013    lumbar spine on xray  . DDD (degenerative disc disease) 03/2013    by CT, diffuse multilevel cervical and lumbar spondylosis  . Osteoporosis  11/2013    DEXA T score -2.7  . Macrocytic anemia 2014    stable B12/folate and periph smear 05/2013, again periph smear 2017 with mature cells, mild dacrocytosis  . Sleep apnea     no CPAP- sleep apnea "cleared up 15 years ago"  . Dizziness     multifactorial, s/p PT at [facility] 06/2014 with HEP  . History of pyelonephritis 05/2014    hospitalization with sepsis  . Closed C7 fracture ([place]) 11/06/2014  . UTI (urinary tract infection) 05/13/2015  . UTI (urinary tract infection) due to Enterococcus 05/13/2015  . Candidal urethritis in male 06/15/2015    Past Surgical History  Procedure Laterality Date  . Tonsillectomy  1965  . Hernia repair  1979    Left  . Cystoscopy  1992    for kidney stones  . Exploratory laparotomy  1996    with incidental appendectomy  . Appendectomy  1996  . Knee arthroscopy  03/24/02    Right (Dr. Mauri Pole)  . Orif femoral neck fracture w/ dhs  08/03/02    Right (Dr. Mauri Pole)  . Ct abd w & pelvis wo cm  07/2001    Scarring of right lung, stable negative o/w  . Ct abd w & pelvis wo cm  11/2000    ? stones, right LL scarring  . V/q scan  06/1999  negative  . Colonoscopy  1998    [place]  . US echocardiography  12/28/2007    Mild aortic valve calcification EF 55%, basically nml  . Carotid u/s  12/28/2007    nml  . Eeg  11/12/2009    nml  . Mri  11/2009    Head, nml  . Cataract extraction  Jan, March 2011    bilateral  . Colonoscopy  07/2010    5 polyps, adenomatous, rec rpt 3 yrs  . Esophagogastroduodenoscopy  02/2012    dilation of schatzki's ring [name]  . Upper gastrointestinal endoscopy    . Colonoscopy  04/2014    3 polyps, adenomatous, f/u open ended given age [name]  . Cardiac catheterization  2000    30% one vessel  . Cardiovascular stress test  03/2015    no ischemia, low risk, EF 76%    Family History  Problem Relation Age of Onset  . Stroke Mother   . Hypertension Mother   . Cancer Brother     prostate with mets  . Blindness Brother      legally  . Diabetes Brother   . Pulmonary embolism Sister     from shoulder operation  . Alcohol abuse Brother   . Colon cancer Neg Hx   . Esophageal cancer Neg Hx   . Rectal cancer Neg Hx   . Stomach cancer Neg Hx    Social History:  reports that he has never smoked. He has never used smokeless tobacco. He reports that he does not drink alcohol or use illicit drugs.  Allergies:  Allergies  Allergen Reactions  . Lactose Intolerance (Gi)   . Nizatidine     REACTION: Rash (Axid)  . Sulfadiazine     REACTION: Hives  . Aricept [Donepezil Hcl] Rash  . Lipitor [Atorvastatin] Rash  . Penicillins Rash    Has patient had a PCN reaction causing immediate rash, facial/tongue/throat swelling, SOB or lightheadedness with hypotension: No Has patient had a PCN reaction causing severe rash involving mucus membranes or skin necrosis: No Has patient had a PCN reaction that required hospitalization No Has patient had a PCN reaction occurring within the last 10 years: No If all of the above answers are "NO",  then may proceed with Cephalosporin use.    Medications: Patient's preadmission medications were reviewed by me.  ROS: History obtained from spouse and the patient  General ROS: negative for - chills, fatigue, fever, night sweats, weight gain or weight loss Psychological ROS: negative for - behavioral disorder, hallucinations, memory difficulties, mood swings or suicidal ideation Ophthalmic ROS: negative for - blurry vision, double vision, eye pain or loss of vision ENT ROS: negative for - epistaxis, nasal discharge, oral lesions, sore throat, tinnitus or vertigo Allergy and Immunology ROS: negative for - hives or itchy/watery eyes Hematological and Lymphatic ROS: negative for - bleeding problems, bruising or swollen lymph nodes Endocrine ROS: negative for - galactorrhea, hair pattern changes, polydipsia/polyuria or temperature intolerance Respiratory ROS: negative for - cough,  hemoptysis, shortness of breath or wheezing Cardiovascular ROS: negative for - chest pain, dyspnea on exertion, edema or irregular heartbeat Gastrointestinal ROS: negative for - abdominal pain, diarrhea, hematemesis, nausea/vomiting or stool incontinence Genito-Urinary ROS: negative for - dysuria, hematuria, incontinence or urinary frequency/urgency Musculoskeletal ROS: negative for - joint swelling or muscular weakness Neurological ROS: as noted in HPI Dermatological ROS: Gluteal abscess  Physical Examination: Blood pressure 95/50, pulse 68, temperature 98.4 F (36.9 C), temperature source Oral, resp. rate  14, height 5\' 6"  (1.676 m), weight 78.472 kg (173 lb), SpO2 100 %.  HEENT-  Normocephalic, no lesions, without obvious abnormality.  Normal external eye and conjunctiva.  Normal TM's bilaterally.  Normal auditory canals and external ears. Normal external nose, mucus membranes and septum.  Normal pharynx. Neck supple with no masses, nodes, nodules or enlargement. Cardiovascular - regular rate and rhythm, S1, S2 normal, no murmur, click, rub or gallop Lungs - chest clear, no wheezing, rales, normal symmetric air entry Abdomen - soft, non-tender; bowel sounds normal; no masses,  no organomegaly Extremities - no joint deformities, effusion, or inflammation and no edema  Neurologic Examination: Mental Status: Alert, slightly disoriented to correct age, complaining of feeling tired.  Speech fluent without evidence of aphasia. Able to follow commands without difficulty. Cranial Nerves: II-Visual fields were normal. III/IV/VI-Pupils were equal and reacted normally to light. Extraocular movements were full and conjugate.    V/VII-no facial numbness and no facial weakness. VIII-normal. X-normal speech and symmetrical palatal movement. XI: trapezius strength/neck flexion strength normal bilaterally XII-midline tongue extension with normal strength. Motor: 5/5 bilaterally with normal tone and  bulk Sensory: Normal throughout. Deep Tendon Reflexes: 1+ and symmetric. Plantars: Mute bilaterally Cerebellar: Moderately impaired upper extremity coordination due to intention tremor bilaterally. Carotid auscultation: Normal  Results for orders placed or performed during the hospital encounter of 11/12/15 (from the past 48 hour(s))  Protime-INR     Status: Abnormal   Collection Time: 11/12/15  7:47 AM  Result Value Ref Range   Prothrombin Time 15.3 (H) 11.6 - 15.2 seconds   INR 1.19 0.00 - 1.49  APTT     Status: None   Collection Time: 11/12/15  7:47 AM  Result Value Ref Range   aPTT 27 24 - 37 seconds  CBC     Status: Abnormal   Collection Time: 11/12/15  7:47 AM  Result Value Ref Range   WBC 5.8 4.0 - 10.5 K/uL   RBC 3.15 (L) 4.22 - 5.81 MIL/uL   Hemoglobin 11.8 (L) 13.0 - 17.0 g/dL   HCT 01/12/16 (L) 17.8 - 18.9 %   MCV 111.7 (H) 78.0 - 100.0 fL   MCH 37.5 (H) 26.0 - 34.0 pg   MCHC 33.5 30.0 - 36.0 g/dL   RDW 48.5 39.1 - 13.4 %   Platelets 127 (L) 150 - 400 K/uL  Differential     Status: None   Collection Time: 11/12/15  7:47 AM  Result Value Ref Range   Neutrophils Relative % 60 %   Lymphocytes Relative 27 %   Monocytes Relative 8 %   Eosinophils Relative 5 %   Basophils Relative 0 %   Neutro Abs 3.4 1.7 - 7.7 K/uL   Lymphs Abs 1.6 0.7 - 4.0 K/uL   Monocytes Absolute 0.5 0.1 - 1.0 K/uL   Eosinophils Absolute 0.3 0.0 - 0.7 K/uL   Basophils Absolute 0.0 0.0 - 0.1 K/uL   WBC Morphology ATYPICAL LYMPHOCYTES   Comprehensive metabolic panel     Status: Abnormal   Collection Time: 11/12/15  7:47 AM  Result Value Ref Range   Sodium 142 135 - 145 mmol/L   Potassium 4.3 3.5 - 5.1 mmol/L   Chloride 107 101 - 111 mmol/L   CO2 25 22 - 32 mmol/L   Glucose, Bld 119 (H) 65 - 99 mg/dL   BUN 29 (H) 6 - 20 mg/dL   Creatinine, Ser 01/12/16 (H) 0.61 - 1.24 mg/dL   Calcium 9.6 8.9 - 10.3  mg/dL   Total Protein 6.2 (L) 6.5 - 8.1 g/dL   Albumin 3.5 3.5 - 5.0 g/dL   AST 30 15 - 41 U/L    ALT 24 17 - 63 U/L   Alkaline Phosphatase 52 38 - 126 U/L   Total Bilirubin 1.2 0.3 - 1.2 mg/dL   GFR calc non Af Amer 41 (L) >60 mL/min   GFR calc Af Amer 47 (L) >60 mL/min    Comment: (NOTE) The eGFR has been calculated using the CKD EPI equation. This calculation has not been validated in all clinical situations. eGFR's persistently <60 mL/min signify possible Chronic Kidney Disease.    Anion gap 10 5 - 15  Ethanol     Status: None   Collection Time: 11/12/15  7:48 AM  Result Value Ref Range   Alcohol, Ethyl (B) <5 <5 mg/dL    Comment:        LOWEST DETECTABLE LIMIT FOR SERUM ALCOHOL IS 5 mg/dL FOR MEDICAL PURPOSES ONLY   I-stat troponin, ED (not at [facility], [facility])     Status: None   Collection Time: 11/12/15  7:48 AM  Result Value Ref Range   Troponin i, poc 0.00 0.00 - 0.08 ng/mL   Comment 3            Comment: Due to the release kinetics of cTnI, a negative result within the first hours of the onset of symptoms does not rule out myocardial infarction with certainty. If myocardial infarction is still suspected, repeat the test at appropriate intervals.   I-Stat Chem 8, ED  (not at [facility], [facility])     Status: Abnormal   Collection Time: 11/12/15  7:50 AM  Result Value Ref Range   Sodium 143 135 - 145 mmol/L   Potassium 4.2 3.5 - 5.1 mmol/L   Chloride 104 101 - 111 mmol/L   BUN 32 (H) 6 - 20 mg/dL   Creatinine, Ser 1.50 (H) 0.61 - 1.24 mg/dL   Glucose, Bld 111 (H) 65 - 99 mg/dL   Calcium, Ion 1.27 1.13 - 1.30 mmol/L   TCO2 27 0 - 100 mmol/L   Hemoglobin 12.2 (L) 13.0 - 17.0 g/dL   HCT 36.0 (L) 39.0 - 52.0 %   Ct Head Wo Contrast  11/12/2015  CLINICAL DATA:  Aphasia, confusion EXAM: CT HEAD WITHOUT CONTRAST TECHNIQUE: Contiguous axial images were obtained from the base of the skull through the vertex without intravenous contrast. COMPARISON:  11/06/2014 FINDINGS: No skull fracture is noted. Paranasal sinuses and mastoid air cells are unremarkable. No intracranial hemorrhage,  mass effect or midline shift. Stable atrophy and chronic white matter disease. No definite acute cortical infarction. No mass lesion is noted on this unenhanced scan. Ventricular size is stable from prior exam. IMPRESSION: No acute intracranial abnormality. Stable atrophy and chronic white matter disease. No definite acute cortical infarction. Electronically Signed   By: Lahoma Crocker M.D.   On: 11/12/2015 08:37    Assessment: 82 y.o. man presenting with new onset expressive aphasia, by description, likely manifestation of TIA. A small left MCA territory cerebral infarction cannot be ruled out at this point, however.  Stroke Risk Factors - hyperlipidemia  Plan: 1. HgbA1c, fasting lipid panel 2. MRI, MRA  of the brain without contrast 3. PT consult, OT consult, Speech consult 4. Echocardiogram 5. Carotid dopplers 6. Prophylactic therapy-Antiplatelet med: Aspirin 7. Risk factor modification 8. Telemetry monitoring  C.R. Nicole Kindred, MD [facility] [phone]  11/12/2015, 11:01 AM

## 2015-11-13 ENCOUNTER — Ambulatory Visit (HOSPITAL_COMMUNITY): Payer: Medicare Other

## 2015-11-13 DIAGNOSIS — I6789 Other cerebrovascular disease: Secondary | ICD-10-CM

## 2015-11-13 DIAGNOSIS — E538 Deficiency of other specified B group vitamins: Secondary | ICD-10-CM

## 2015-11-13 LAB — CBC
HCT: 30.3 % — ABNORMAL LOW (ref 39.0–52.0)
HEMOGLOBIN: 10.2 g/dL — AB (ref 13.0–17.0)
MCH: 37.5 pg — AB (ref 26.0–34.0)
MCHC: 33.7 g/dL (ref 30.0–36.0)
MCV: 111.4 fL — ABNORMAL HIGH (ref 78.0–100.0)
PLATELETS: 110 10*3/uL — AB (ref 150–400)
RBC: 2.72 MIL/uL — ABNORMAL LOW (ref 4.22–5.81)
RDW: 14.2 % (ref 11.5–15.5)
WBC: 6.3 10*3/uL (ref 4.0–10.5)

## 2015-11-13 LAB — LIPID PANEL
CHOLESTEROL: 137 mg/dL (ref 0–200)
HDL: 13 mg/dL — ABNORMAL LOW (ref >40)
LDL Cholesterol: 74 mg/dL (ref 0–99)
Total CHOL/HDL Ratio: 10.5 RATIO
Triglycerides: 248 mg/dL — ABNORMAL HIGH (ref <150)
VLDL: 50 mg/dL — AB (ref 0–40)

## 2015-11-13 LAB — BASIC METABOLIC PANEL
Anion gap: 9 (ref 5–15)
BUN: 32 mg/dL — ABNORMAL HIGH (ref 6–20)
CALCIUM: 9.2 mg/dL (ref 8.9–10.3)
CO2: 25 mmol/L (ref 22–32)
CREATININE: 1.7 mg/dL — AB (ref 0.61–1.24)
Chloride: 109 mmol/L (ref 101–111)
GFR calc Af Amer: 41 mL/min — ABNORMAL LOW (ref >60)
GFR calc non Af Amer: 36 mL/min — ABNORMAL LOW (ref >60)
GLUCOSE: 122 mg/dL — AB (ref 65–99)
Potassium: 4.3 mmol/L (ref 3.5–5.1)
Sodium: 143 mmol/L (ref 135–145)

## 2015-11-13 MED ORDER — PANTOPRAZOLE SODIUM 40 MG PO TBEC
40.0000 mg | DELAYED_RELEASE_TABLET | Freq: Every day | ORAL | Status: DC
  Administered 2015-11-13 – 2015-11-16 (×4): 40 mg via ORAL
  Filled 2015-11-13 (×4): qty 1

## 2015-11-13 MED ORDER — GABAPENTIN 100 MG PO CAPS
200.0000 mg | ORAL_CAPSULE | Freq: Three times a day (TID) | ORAL | Status: DC
  Administered 2015-11-13 – 2015-11-16 (×7): 200 mg via ORAL
  Filled 2015-11-13 (×8): qty 2

## 2015-11-13 MED ORDER — GADOBENATE DIMEGLUMINE 529 MG/ML IV SOLN
15.0000 mL | Freq: Once | INTRAVENOUS | Status: AC | PRN
  Administered 2015-11-13: 15 mL via INTRAVENOUS

## 2015-11-13 NOTE — Progress Notes (Signed)
Subjective: Patient had no new complaints. He has not had a recurrence of difficulty.  Objective: Current vital signs: BP 112/47 mmHg  Pulse 75  Temp(Src) 98 F (36.7 C) (Oral)  Resp 16  Ht 5\' 6"  (1.676 m)  Wt 78.472 kg (173 lb)  BMI 27.94 kg/m2  SpO2 100%  Neurologic Exam: Patient was alert and in no acute distress. Mental status was normal. Extraocular movements were full and conjugate. Face was symmetrical with no focal weakness. Speech was normal with no dysarthria and no signs of aphasia. Motor exam normal strength and movement of extremities equally. Coordination was normal.  Medications: I have reviewed the patient's current medications.  MRI of the brain showed a small 4 mm acute ischemic left parietal infarction. MRA showed left M2 short segment severe stenosis, but was otherwise unremarkable.  2-D echocardiogram showed no indications of thrombogenic.  Carotid Doppler study is pending.  Fasting lipid panel showed triglycerides of 248, HDL 13 and VLDL 50. Total cholesterol was 137.  Hemoglobin A1c results pending.  Assessment/Plan: 82 year old man acute small left parietal ischemic infarction, who presented with transient expressive aphasia. Deficits resolved and he said no recurrence of speech abnormality. Currently on aspirin and Tri[employer]. Remainder of his workup is expected to be completed tomorrow.  C.R. Nicole Kindred, MD [facility] [phone]  11/13/2015  6:36 PM

## 2015-11-13 NOTE — Evaluation (Addendum)
Occupational Therapy Evaluation Patient Details Name: [name] MRN: [id number] DOB: Feb 06, 1931 Today's Date: 11/13/2015    History of Present Illness Pt admitted for speech difficulty. MRI revealed infarct in left parietal lobe. PMH includes GERD, CAD, CKD, depression, phlebitis, allergic rhinitis, urge incontinence, HLD, asthma, arthritis, Schatzki's ring, Rt hip fx, hearing loss, essential tremor, DDD, osteoporosis, UTI, dizziness, macrocytic anemia, Rt knee arthroscopy, ORIF femoral neck fracture with dhs (right).    Clinical Impression   Pt admitted with above. Pt getting assist with ADLs, PTA. Feel pt will benefit from acute OT to increase independence prior to d/c. Recommending Outpatient OT upon d/c.    Follow Up Recommendations  Outpatient OT;Supervision/Assistance - 24 hour    Equipment Recommendations  None recommended by OT    Recommendations for Other Services       Precautions / Restrictions Precautions Precautions: Fall Precaution Comments: wife reports 3 falls in 3 weeks; prior to that it had been months Restrictions Weight Bearing Restrictions: No      Mobility Bed Mobility          General bed mobility comments: not performed in session  Transfers Overall transfer level: Needs assistance  Transfers: Sit to/from Stand Sit to Stand/Stand to Sit: Min assist         General transfer comment: assist for stand to sit to chair; Min guard for sit to stand    Balance Unsteady with ambulation.                         ADL Overall ADL's : Needs assistance/impaired                 Upper Body Dressing : Minimal assistance;Sitting (for fasteners)   Lower Body Dressing: Min guard;Sit to/from stand Lower Body Dressing Details (indicate cue type and reason): pt able to don/doff shoes and socks in session; velcro shoes             Functional mobility during ADLs: Min guard (for ambulation) General ADL Comments: Talked about  strategies for decreased memory and clothing to assist pt due to tremors.     Vision  Pt wears glasses; reports cataract surgery; reports double vision at baseline and has corrective lens for this; reports no new changes from baseline   Perception     Praxis      Pertinent Vitals/Pain Pain Assessment: No/denies pain     Hand Dominance     Extremity/Trunk Assessment Upper Extremity Assessment Upper Extremity Assessment: Generalized weakness;RUE deficits/detail;LUE deficits/detail (weakness in bilateral shoulder flexors; tremors in both UEs) RUE Coordination: decreased fine motor LUE Coordination: decreased fine motor   Lower Extremity Assessment Lower Extremity Assessment: Defer to PT evaluation   Cervical / Trunk Assessment Cervical / Trunk Assessment: Kyphotic   Communication Communication Communication: No difficulties   Cognition Arousal/Alertness: Awake/alert Behavior During Therapy: WFL for tasks assessed/performed Overall Cognitive Status: History of cognitive impairments - at baseline       Memory: Decreased short-term memory             General Comments       Exercises       Shoulder Instructions      Home Living Family/patient expects to be discharged to:: Private residence Living Arrangements: Spouse/significant other Available Help at Discharge: Family;Available 24 hours/day Type of Home: House Home Access: Ramped entrance     Home Layout: One level     Bathroom Shower/Tub: Tub/shower unit   [employer]  Toilet: Standard Bathroom Accessibility: No (one fits into; other has grab bars/handles)   Home Equipment: Walker - 2 wheels;Walker - 4 wheels;Bedside commode;Tub bench;Hand held shower head;Grab bars - tub/shower;Electric scooter;Adaptive equipment (scooter can't be transported by car) Adaptive Equipment: Other (Comment);Sock aid (weighted utensil)    Lives With: Spouse    Prior Functioning/Environment Level of Independence: Needs  assistance  Gait / Transfers Assistance Needed: walks modified Independent with RW; uses rollator when going out; step stool up into bed (wife holds std walker for him to use as a rail as he steps up sideways onto step) ADL's / Homemaking Assistance Needed: supervision for shower transfer; assist with bathing and dressing; uses velcro shoes and elastic pants        OT Diagnosis: Generalized weakness; decreased coordination   OT Problem List: Decreased strength;Impaired UE functional use;Decreased coordination;Decreased cognition;Decreased knowledge of precautions;Decreased knowledge of use of DME or AE;Decreased activity tolerance;Decreased range of motion;Impaired balance (sitting and/or standing)   OT Treatment/Interventions: Self-care/ADL training;DME and/or AE instruction;Therapeutic activities;Cognitive remediation/compensation;Balance training;Patient/family education;Therapeutic exercise    OT Goals(Current goals can be found in the care plan section) Acute Rehab OT Goals Patient Stated Goal: not stated OT Goal Formulation: With patient Time For Goal Achievement: 11/20/15 Potential to Achieve Goals: Good ADL Goals Pt Will Perform Lower Body Dressing: with supervision;sit to/from stand (including gathering items and using adaptive clothing) Pt Will Transfer to Toilet: with modified independence;ambulating (3 in 1 over commode) Additional ADL Goal #1: Pt will perform HEP for bilateral UEs to increase strength. Additional ADL Goal #2: Pt/caregiver will verbalize understanding of AE to help pt with UE tremors.  OT Frequency: Min 2X/week   Barriers to D/C:            Co-evaluation              End of Session Equipment Utilized During Treatment: Gait belt Nurse Communication: Other (comment) (recommending Outpatient OT)  Activity Tolerance: Patient tolerated treatment well Patient left: in chair;with family/visitor present;with call bell/phone within reach   Time:  [id number] OT Time Calculation (min): 20 min Charges:  OT General Charges $OT Visit: 1 Procedure OT Evaluation $OT Eval Moderate Complexity: 1 Procedure G-CodesBenito Mccreedy OTR/L [id number] 11/13/2015, 5:20 PM

## 2015-11-13 NOTE — Care Management Note (Signed)
Case Management Note  Patient Details  Name: ABDULBASIT MCCOURY MRN: [id number] Date of Birth: 01-Sep-1931  Subjective/Objective:                    Action/Plan: Patient was admitted with CVA. Will follow for discharge needs pending PT/OT evals and physician orders.  Expected Discharge Date:                  Expected Discharge Plan:     In-House Referral:     Discharge planning Services     Post Acute Care Choice:    Choice offered to:     DME Arranged:    DME Agency:     HH Arranged:    HH Agency:     Status of Service:  In process, will continue to follow  Medicare Important Message Given:    Date Medicare IM Given:    Medicare IM give by:    Date Additional Medicare IM Given:    Additional Medicare Important Message give by:     If discussed at [place] of Stay Meetings, dates discussed:    Additional Comments:  Rolm Baptise, RN 11/13/2015, 10:50 AM [phone]

## 2015-11-13 NOTE — Progress Notes (Addendum)
Speech Language Pathology Treatment: Dysphagia  Patient Details Name: [name] MRN: [id number] DOB: [birth year] Today's Date: 11/13/2015 Time: 1340-1350 SLP Time Calculation (min) (ACUTE ONLY): 10 min  Assessment / Plan / Recommendation Clinical Impression  Pt was seen for skilled ST targeting dysphagia goals.  Speech-language evaluation completed on this date also, please see separate report.  Pt was observed with presentations of regular textures and thin liquids to assess toleration of currently prescribed diet. Pt reported intermittent difficulty ("getting stuck") with home fries during breakfast this AM and with chicken and dumplings during lunch this PM which is consistent with baseline esophageal dysphagia per report from pt and wife.  Pt demonstrated audible swallows during consumption of liquids and solids but vocal quality remained clear before and after trials and he was able to clear solids from the oral cavity post swallow without difficulty.  No complaints of globus sensation or overt s/s of aspiration evident with solids or liquids.  Recommend d/c swallowing goals as pt appears to be at baseline for swallowing function.     HPI HPI: 82 y.o. male who presents with acute onset of transient speech difficulty. PMH: hyperlipidemia, Schatzki's ring, essential tremor, sleep apnea, chronic kidney disease, esophageal stretching x3 (was scheduled to visit with GI 3/6), essential tremor, DISH, and dysphagia (previous MBS in 2013 recommended a regular diet and thin liquids with intermittent penetration of thin liquids that cleared spontaneously, BSE in 11/2014 suggestive of similar function).      SLP Plan  Discharge SLP treatment due to (comment) (pt at baseline for swallowing function )     Recommendations  Diet recommendations: Regular;Thin liquid Liquids provided via: Cup;Straw Medication Administration: Whole meds with puree Supervision: Patient able to self feed Compensations:  Slow rate;Small sips/bites;Follow solids with liquid             Oral Care Recommendations: Oral care BID Follow up Recommendations: None Plan: Discharge SLP treatment due to (comment) (pt at baseline for swallowing function )     GO                Alice Burnside, Selinda Orion 11/13/2015, 2:29 PM

## 2015-11-13 NOTE — Progress Notes (Signed)
OT Cancellation Note  Patient Details Name: [name] MRN: [id number] DOB: 07/21/1931   Cancelled Treatment:    Reason Eval/Treat Not Completed:  Pt on bed rest per order. Please update activity orders when pt appropriate for OT evaluation.  Benito Mccreedy OTR/L [id number] 11/13/2015, 8:31 AM

## 2015-11-13 NOTE — Progress Notes (Signed)
[facility] PROGRESS NOTE  [name] [id number] DOB: [birth year] DOA: 11/12/2015 PCP: Ria Bush, MD  Brief Narrative: 82 year old who presented to the hospital after difficulty with speech and was found to have new CVA. Currently undergoing stroke workup  Assessment/Plan: Principal Problem:   Stroke [facility]) - Neurology consulted and recommended stroke workup. Patient currently undergoing stroke workup and MRI of brain reports punctate 4 mm ischemic cortical left parietal lobe infarct. This was discussed with family today. - Awaiting completion of stroke work up  Active Problems:    CHF (congestive heart failure) ([place]) - Compensated currently    Hypotension - Reportedly patient has history of low blood pressures. Last blood pressure 112/47.    Benign essential tremor   B12 deficiency - Continue B-12 replacement on discharge   Sacral ulcer - Patient treated with doxycycline as outpatient, continue Bactroban topical, wound care consult placed initially  Code Status: Full Family Communication: Discussed with patient and spouse Disposition Plan: Pending final workup results and recommendations from neurologist   Consultants:  Neurology  Procedures:  Please refer to EMR  Antibiotics:  None  HPI/Subjective: Patient has no new complaints. No acute issues overnight  Objective: Filed Vitals:   11/13/15 0633 11/13/15 1134  BP: 108/52 112/47  Pulse: 65 75  Temp: 97.6 F (36.4 C) 98 F (36.7 C)  Resp: 18 16    Intake/Output Summary (Last 24 hours) at 11/13/15 1805 Last data filed at 11/13/15 1300  Gross per 24 hour  Intake 1870.83 ml  Output      0 ml  Net 1870.83 ml   Filed Weights   11/12/15 0727  Weight: 78.472 kg (173 lb)    Exam:   General:  Pt in nad, alert and awake  Cardiovascular: s1 and s2 wnl, no rubs  Respiratory: no increased wob, no wheezes  Abdomen: soft, nd, nt  Musculoskeletal:  No cyanosis or clubbing   Data  Reviewed: Basic Metabolic Panel:  Recent Labs Lab 11/12/15 0747 11/12/15 0750 11/12/15 1201 11/13/15 0557  NA 142 143  --  143  K 4.3 4.2  --  4.3  CL 107 104  --  109  CO2 25  --   --  25  GLUCOSE 119* 111*  --  122*  BUN 29* 32*  --  32*  CREATININE 1.52* 1.50* 1.57* 1.70*  CALCIUM 9.6  --   --  9.2   Liver Function Tests:  Recent Labs Lab 11/12/15 0747  AST 30  ALT 24  ALKPHOS 52  BILITOT 1.2  PROT 6.2*  ALBUMIN 3.5   No results for input(s): LIPASE, AMYLASE in the last 168 hours. No results for input(s): AMMONIA in the last 168 hours. CBC:  Recent Labs Lab 11/12/15 0747 11/12/15 0750 11/12/15 1201 11/13/15 0557  WBC 5.8  --  5.9 6.3  NEUTROABS 3.4  --   --   --   HGB 11.8* 12.2* 10.9* 10.2*  HCT 35.2* 36.0* 33.1* 30.3*  MCV 111.7*  --  111.4* 111.4*  PLT 127*  --  120* 110*   Cardiac Enzymes: No results for input(s): CKTOTAL, CKMB, CKMBINDEX, TROPONINI in the last 168 hours. BNP (last 3 results) No results for input(s): BNP in the last 8760 hours.  ProBNP (last 3 results) No results for input(s): PROBNP in the last 8760 hours.  CBG: No results for input(s): GLUCAP in the last 168 hours.  No results found for this or any previous visit (from the past 240 hour(s)).  Studies: Dg Chest 2 View  11/12/2015  CLINICAL DATA:  Patient with stroke.  Weakness. EXAM: CHEST  2 VIEW COMPARISON:  Chest radiograph 03/29/2015. FINDINGS: Stable enlarged cardiac and mediastinal contours. Unchanged calcified right hilar lymph nodes. No consolidation. No pleural effusion or pneumothorax. Thoracic spine degenerative changes and associated ankylosis. IMPRESSION: Cardiomegaly.  No acute cardiopulmonary process. Electronically Signed   By: Lovey Newcomer M.D.   On: 11/12/2015 12:26   Ct Head Wo Contrast  11/12/2015  CLINICAL DATA:  Aphasia, confusion EXAM: CT HEAD WITHOUT CONTRAST TECHNIQUE: Contiguous axial images were obtained from the base of the skull through the vertex  without intravenous contrast. COMPARISON:  11/06/2014 FINDINGS: No skull fracture is noted. Paranasal sinuses and mastoid air cells are unremarkable. No intracranial hemorrhage, mass effect or midline shift. Stable atrophy and chronic white matter disease. No definite acute cortical infarction. No mass lesion is noted on this unenhanced scan. Ventricular size is stable from prior exam. IMPRESSION: No acute intracranial abnormality. Stable atrophy and chronic white matter disease. No definite acute cortical infarction. Electronically Signed   By: Lahoma Crocker M.D.   On: 11/12/2015 08:37   Mr Angiogram Neck W Wo Contrast  11/13/2015  CLINICAL DATA:  Initial evaluation for new onset expressive aphasia. EXAM: MR HEAD WITHOUT CONTRAST MR CIRCLE OF WILLIS WITHOUT CONTRAST MRA OF THE NECK WITHOUT AND WITH CONTRAST TECHNIQUE: Multiplanar, multiecho pulse sequences of the brain and surrounding structures were obtained according to standard protocol without intravenous contrast.; Multiplanar and multiecho pulse sequences of the neck were obtained without and with intravenous contrast. Angiographic images of the neck were obtained using MRA technique without and with intravenous contrast.; Angiographic images of the Circle of Willis were obtained using MRA technique without intravenous contrast. CONTRAST:  80mL MULTIHANCE GADOBENATE DIMEGLUMINE 529 MG/ML IV SOLN COMPARISON:  Prior CT from re-/ 6/17. FINDINGS: MR HEAD FINDINGS Diffusion-weighted imaging demonstrates a punctate 4 mm focus of restricted diffusion within the cortical gray matter of the left parietal lobe, consistent with a tiny acute ischemic infarct (series 5, image 27). No associated hemorrhage. No other infarct. Gray-white matter differentiation otherwise maintained. Major intracranial vascular flow voids are preserved. Diffuse prominence of the CSF containing spaces is compatible with generalized age-related cerebral atrophy. Patchy and confluent T2/FLAIR  hyperintensity within the periventricular and deep white matter both cerebral hemispheres most consistent with chronic small vessel ischemic disease, moderate nature. Small vessel type changes present within the pons. No remote infarction identified. No acute or chronic intracranial hemorrhage. No mass lesion, midline shift, or mass effect. Ventricular prominence related to global parenchymal volume loss present without hydrocephalus. No extra-axial fluid collection. Craniocervical junction within normal limits. Mild degenerative spondylolysis within the visualized upper cervical spine. Pituitary gland normal. No acute abnormality about the orbits. Sequela prior bilateral lens extraction noted. Mild mucosal thickening within the max O sinuses and ethmoidal air cells. Bilateral concha bullosa noted. No nasal septal deviation. Middle air cavities grossly clear. Inner ear structures normal. Bone marrow signal intensity within normal limits. No scalp soft tissue abnormality. MR CIRCLE OF WILLIS FINDINGS ANTERIOR CIRCULATION: Visualized distal cervical segments of the internal carotid arteries are patent with antegrade flow. Petrous, cavernous, and supraclinoid segments widely patent. A1 segments, anterior communicating artery, and anterior cerebral arteries well opacified. M1 segments widely patent without stenosis or occlusion. MCA bifurcations within normal limits. There is an apparent focal high-grade stenosis within a proximal right M2 branch (series 601, image 12). This is nonocclusive of flow seen distally. Attenuation  within the more superior right M2 branch favored to be related to motion. No other focal high-grade stenosis. MCA branches demonstrate mild distal small vessel branch irregularity bilaterally. MCA branches are fairly symmetric with the regional hypoperfusion. POSTERIOR CIRCULATION: Vertebral arteries patent to the vertebrobasilar junction. Posterior inferior cerebellar arteries patent bilaterally.  Basilar artery widely patent to its distal aspect. Superior cerebellar arteries patent bilaterally. Both posterior cerebral arteries arise from the basilar artery and are well opacified to their distal aspects. Mild distal small vessel irregularity within the PCAs bilaterally. No aneurysm or vascular malformation. MRA NECK FINDINGS Visualized aortic arch is of normal caliber with normal branch pattern. No high-grade stenosis at the origin of the great vessels. Visualized subclavian arteries widely patent. Right common carotid artery patent from its origin to the bifurcation. Probable minimal a centric plaque about the bifurcation without stenosis. Right ICA widely patent from the bifurcation to the skullbase without high-grade stenosis or occlusion. Left common carotid artery patent from its origin to the bifurcation. No significant atheromatous disease about the bifurcation appreciated. Left ICA widely patent from the bifurcation to the skullbase. No flow-limiting stenosis or occlusion within the left carotid artery system. The left vertebral artery appears to arise separately from the aortic arch. Right vertebral artery arises from the subclavian artery. Vertebral arteries are widely patent along their entire course without stenosis or vascular occlusion. IMPRESSION: MRI HEAD IMPRESSION: 1. Punctate 4 mm ischemic cortical left parietal lobe infarct. No associated hemorrhage. 2. No other acute intracranial process. 3. Generalized age-related cerebral atrophy with moderate chronic small vessel ischemic disease. MRA HEAD IMPRESSION: 1. No large or proximal arterial branch occlusion within the intracranial circulation. 2. Single short-segment severe nonocclusive right M2 stenosis as above. 3. No other focal severe or correctable stenosis identified within the intracranial circulation. 4. Mild distal small vessel atheromatous irregularity within the MCA and PCA branches bilaterally MRA NECK IMPRESSION: Negative MRA of  the neck. No critical or high-grade flow-limiting stenosis identified. Electronically Signed   By: Jeannine Boga M.D.   On: 11/13/2015 03:06   Mr Brain Wo Contrast  11/13/2015  CLINICAL DATA:  Initial evaluation for new onset expressive aphasia. EXAM: MR HEAD WITHOUT CONTRAST MR CIRCLE OF WILLIS WITHOUT CONTRAST MRA OF THE NECK WITHOUT AND WITH CONTRAST TECHNIQUE: Multiplanar, multiecho pulse sequences of the brain and surrounding structures were obtained according to standard protocol without intravenous contrast.; Multiplanar and multiecho pulse sequences of the neck were obtained without and with intravenous contrast. Angiographic images of the neck were obtained using MRA technique without and with intravenous contrast.; Angiographic images of the Circle of Willis were obtained using MRA technique without intravenous contrast. CONTRAST:  75mL MULTIHANCE GADOBENATE DIMEGLUMINE 529 MG/ML IV SOLN COMPARISON:  Prior CT from re-/ 6/17. FINDINGS: MR HEAD FINDINGS Diffusion-weighted imaging demonstrates a punctate 4 mm focus of restricted diffusion within the cortical gray matter of the left parietal lobe, consistent with a tiny acute ischemic infarct (series 5, image 27). No associated hemorrhage. No other infarct. Gray-white matter differentiation otherwise maintained. Major intracranial vascular flow voids are preserved. Diffuse prominence of the CSF containing spaces is compatible with generalized age-related cerebral atrophy. Patchy and confluent T2/FLAIR hyperintensity within the periventricular and deep white matter both cerebral hemispheres most consistent with chronic small vessel ischemic disease, moderate nature. Small vessel type changes present within the pons. No remote infarction identified. No acute or chronic intracranial hemorrhage. No mass lesion, midline shift, or mass effect. Ventricular prominence related to global parenchymal volume loss  present without hydrocephalus. No extra-axial  fluid collection. Craniocervical junction within normal limits. Mild degenerative spondylolysis within the visualized upper cervical spine. Pituitary gland normal. No acute abnormality about the orbits. Sequela prior bilateral lens extraction noted. Mild mucosal thickening within the max O sinuses and ethmoidal air cells. Bilateral concha bullosa noted. No nasal septal deviation. Middle air cavities grossly clear. Inner ear structures normal. Bone marrow signal intensity within normal limits. No scalp soft tissue abnormality. MR CIRCLE OF WILLIS FINDINGS ANTERIOR CIRCULATION: Visualized distal cervical segments of the internal carotid arteries are patent with antegrade flow. Petrous, cavernous, and supraclinoid segments widely patent. A1 segments, anterior communicating artery, and anterior cerebral arteries well opacified. M1 segments widely patent without stenosis or occlusion. MCA bifurcations within normal limits. There is an apparent focal high-grade stenosis within a proximal right M2 branch (series 601, image 12). This is nonocclusive of flow seen distally. Attenuation within the more superior right M2 branch favored to be related to motion. No other focal high-grade stenosis. MCA branches demonstrate mild distal small vessel branch irregularity bilaterally. MCA branches are fairly symmetric with the regional hypoperfusion. POSTERIOR CIRCULATION: Vertebral arteries patent to the vertebrobasilar junction. Posterior inferior cerebellar arteries patent bilaterally. Basilar artery widely patent to its distal aspect. Superior cerebellar arteries patent bilaterally. Both posterior cerebral arteries arise from the basilar artery and are well opacified to their distal aspects. Mild distal small vessel irregularity within the PCAs bilaterally. No aneurysm or vascular malformation. MRA NECK FINDINGS Visualized aortic arch is of normal caliber with normal branch pattern. No high-grade stenosis at the origin of the great  vessels. Visualized subclavian arteries widely patent. Right common carotid artery patent from its origin to the bifurcation. Probable minimal a centric plaque about the bifurcation without stenosis. Right ICA widely patent from the bifurcation to the skullbase without high-grade stenosis or occlusion. Left common carotid artery patent from its origin to the bifurcation. No significant atheromatous disease about the bifurcation appreciated. Left ICA widely patent from the bifurcation to the skullbase. No flow-limiting stenosis or occlusion within the left carotid artery system. The left vertebral artery appears to arise separately from the aortic arch. Right vertebral artery arises from the subclavian artery. Vertebral arteries are widely patent along their entire course without stenosis or vascular occlusion. IMPRESSION: MRI HEAD IMPRESSION: 1. Punctate 4 mm ischemic cortical left parietal lobe infarct. No associated hemorrhage. 2. No other acute intracranial process. 3. Generalized age-related cerebral atrophy with moderate chronic small vessel ischemic disease. MRA HEAD IMPRESSION: 1. No large or proximal arterial branch occlusion within the intracranial circulation. 2. Single short-segment severe nonocclusive right M2 stenosis as above. 3. No other focal severe or correctable stenosis identified within the intracranial circulation. 4. Mild distal small vessel atheromatous irregularity within the MCA and PCA branches bilaterally MRA NECK IMPRESSION: Negative MRA of the neck. No critical or high-grade flow-limiting stenosis identified. Electronically Signed   By: Jeannine Boga M.D.   On: 11/13/2015 03:06   Mr Jodene Nam Head/brain Wo Cm  11/13/2015  CLINICAL DATA:  Initial evaluation for new onset expressive aphasia. EXAM: MR HEAD WITHOUT CONTRAST MR CIRCLE OF WILLIS WITHOUT CONTRAST MRA OF THE NECK WITHOUT AND WITH CONTRAST TECHNIQUE: Multiplanar, multiecho pulse sequences of the brain and surrounding  structures were obtained according to standard protocol without intravenous contrast.; Multiplanar and multiecho pulse sequences of the neck were obtained without and with intravenous contrast. Angiographic images of the neck were obtained using MRA technique without and with intravenous contrast.; Angiographic images of  the Circle of Willis were obtained using MRA technique without intravenous contrast. CONTRAST:  48mL MULTIHANCE GADOBENATE DIMEGLUMINE 529 MG/ML IV SOLN COMPARISON:  Prior CT from re-/ 6/17. FINDINGS: MR HEAD FINDINGS Diffusion-weighted imaging demonstrates a punctate 4 mm focus of restricted diffusion within the cortical gray matter of the left parietal lobe, consistent with a tiny acute ischemic infarct (series 5, image 27). No associated hemorrhage. No other infarct. Gray-white matter differentiation otherwise maintained. Major intracranial vascular flow voids are preserved. Diffuse prominence of the CSF containing spaces is compatible with generalized age-related cerebral atrophy. Patchy and confluent T2/FLAIR hyperintensity within the periventricular and deep white matter both cerebral hemispheres most consistent with chronic small vessel ischemic disease, moderate nature. Small vessel type changes present within the pons. No remote infarction identified. No acute or chronic intracranial hemorrhage. No mass lesion, midline shift, or mass effect. Ventricular prominence related to global parenchymal volume loss present without hydrocephalus. No extra-axial fluid collection. Craniocervical junction within normal limits. Mild degenerative spondylolysis within the visualized upper cervical spine. Pituitary gland normal. No acute abnormality about the orbits. Sequela prior bilateral lens extraction noted. Mild mucosal thickening within the max O sinuses and ethmoidal air cells. Bilateral concha bullosa noted. No nasal septal deviation. Middle air cavities grossly clear. Inner ear structures normal.  Bone marrow signal intensity within normal limits. No scalp soft tissue abnormality. MR CIRCLE OF WILLIS FINDINGS ANTERIOR CIRCULATION: Visualized distal cervical segments of the internal carotid arteries are patent with antegrade flow. Petrous, cavernous, and supraclinoid segments widely patent. A1 segments, anterior communicating artery, and anterior cerebral arteries well opacified. M1 segments widely patent without stenosis or occlusion. MCA bifurcations within normal limits. There is an apparent focal high-grade stenosis within a proximal right M2 branch (series 601, image 12). This is nonocclusive of flow seen distally. Attenuation within the more superior right M2 branch favored to be related to motion. No other focal high-grade stenosis. MCA branches demonstrate mild distal small vessel branch irregularity bilaterally. MCA branches are fairly symmetric with the regional hypoperfusion. POSTERIOR CIRCULATION: Vertebral arteries patent to the vertebrobasilar junction. Posterior inferior cerebellar arteries patent bilaterally. Basilar artery widely patent to its distal aspect. Superior cerebellar arteries patent bilaterally. Both posterior cerebral arteries arise from the basilar artery and are well opacified to their distal aspects. Mild distal small vessel irregularity within the PCAs bilaterally. No aneurysm or vascular malformation. MRA NECK FINDINGS Visualized aortic arch is of normal caliber with normal branch pattern. No high-grade stenosis at the origin of the great vessels. Visualized subclavian arteries widely patent. Right common carotid artery patent from its origin to the bifurcation. Probable minimal a centric plaque about the bifurcation without stenosis. Right ICA widely patent from the bifurcation to the skullbase without high-grade stenosis or occlusion. Left common carotid artery patent from its origin to the bifurcation. No significant atheromatous disease about the bifurcation appreciated.  Left ICA widely patent from the bifurcation to the skullbase. No flow-limiting stenosis or occlusion within the left carotid artery system. The left vertebral artery appears to arise separately from the aortic arch. Right vertebral artery arises from the subclavian artery. Vertebral arteries are widely patent along their entire course without stenosis or vascular occlusion. IMPRESSION: MRI HEAD IMPRESSION: 1. Punctate 4 mm ischemic cortical left parietal lobe infarct. No associated hemorrhage. 2. No other acute intracranial process. 3. Generalized age-related cerebral atrophy with moderate chronic small vessel ischemic disease. MRA HEAD IMPRESSION: 1. No large or proximal arterial branch occlusion within the intracranial circulation. 2. Single short-segment  severe nonocclusive right M2 stenosis as above. 3. No other focal severe or correctable stenosis identified within the intracranial circulation. 4. Mild distal small vessel atheromatous irregularity within the MCA and PCA branches bilaterally MRA NECK IMPRESSION: Negative MRA of the neck. No critical or high-grade flow-limiting stenosis identified. Electronically Signed   By: Jeannine Boga M.D.   On: 11/13/2015 03:06    Scheduled Meds: . antiseptic oral rinse  7 mL Mouth Rinse q12n4p  . aspirin  300 mg Rectal Daily   Or  . aspirin  325 mg Oral Daily  . chlorhexidine  15 mL Mouth Rinse BID  . fenofibrate  54 mg Oral q morning - 10a  . heparin  5,000 Units Subcutaneous 3 times per day  . oxybutynin  5 mg Oral BID  . venlafaxine  75 mg Oral Daily   Continuous Infusions: . sodium chloride 50 mL/hr at 11/13/15 1534     Time spent: > 35 minutes    Velvet Bathe  [facility] Pager [phone]. If 7PM-7AM, please contact night-coverage at [email or web address], password [facility] 11/13/2015, 6:05 PM  LOS: 1 day

## 2015-11-13 NOTE — Evaluation (Signed)
Speech Language Pathology Evaluation Patient Details Name: [name] MRN: [id number] DOB: September 04, 1931 Today's Date: 11/13/2015 Time: 1350-1402 SLP Time Calculation (min) (ACUTE ONLY): 12 min  Problem List:  Patient Active Problem List   Diagnosis Date Noted  . Aphasia 11/12/2015  . Stroke (HCC) 11/12/2015  . CHF (congestive heart failure) (HCC) 11/12/2015  . Hypotension 11/12/2015  . Benign essential tremor 11/12/2015  . B12 deficiency 11/12/2015  . Stroke-like symptoms   . Sacral ulcer   . Fever, unspecified 09/04/2015  . Skin rash 04/09/2015  . Chest pain at rest 03/29/2015  . Pedal edema 02/07/2015  . CN (constipation) 01/15/2015  . Orthostatic hypotension 11/24/2014  . Syncope 11/07/2014  . Advanced care planning/counseling discussion 11/06/2014  . Daytime somnolence 11/06/2014  . Macrocytic anemia   . History of colonic polyps 03/27/2014  . Trigger finger, acquired 01/05/2014  . Osteoporosis 11/06/2013  . Dementia 06/08/2013  . Vertigo 01/28/2013  . Bilateral hearing loss 06/07/2012  . GERD (gastroesophageal reflux disease) 04/30/2012  . Dysphagia 12/30/2011  . HLD (hyperlipidemia) 12/08/2011  . Depression   . Medicare annual wellness visit, subsequent 06/09/2011  . UNSTEADY GAIT 04/16/2010  . Vitamin B12 deficiency 11/01/2008  . Vitamin D deficiency 11/01/2008  . Essential tremor 09/27/2008  . HSV 03/08/2007  . Thrombocytopenia (HCC) 03/08/2007  . Coronary atherosclerosis 03/08/2007  . CKD (chronic kidney disease) stage 3, GFR 30-59 ml/min 03/08/2007  . INCONTINENCE, URGE 03/08/2007  . RENAL CALCULUS, HX OF 03/08/2007  . THROMBOPHLEBITIS 01/04/2007  . Personal history of pulmonary embolism 09/08/1990   Past Medical History:  Past Medical History  Diagnosis Date  . GERD (gastroesophageal reflux disease)     h/o PUD  . CAD (coronary artery disease)     cath 2000 30% single vessel, normal nuclear stress test 12/03/2010, no evidence ischemia  . CKD  (chronic kidney disease) stage 3, GFR 30-59 ml/min     baseline Cr 1.7  . Depression   . Hx pulmonary embolism 08/1991    negative hypercoagulable panel 12/2014  . History of phlebitis   . Allergic rhinitis   . Urge incontinence   . Elevated PSA     previous-normalized (followed by Dr. Annabell Howells, rec no repeat unless urinary sxs)  . HLD (hyperlipidemia)     hypertriglyceridemia  . Asthma     remote  . Arthritis   . Blood transfusion 1990's  . Schatzki's ring 02/2012    s/p dilation Christella Hartigan)  . History of fracture of right hip   . Hearing loss 06/2012    eval - rec annual exam  . Essential tremor 09/27/2008    reviewed eval by Dr Anne Hahn in chart 2011   . Prediabetes   . DISH (diffuse idiopathic skeletal hyperostosis) 03/2013    lumbar spine on xray  . DDD (degenerative disc disease) 03/2013    by CT, diffuse multilevel cervical and lumbar spondylosis  . Osteoporosis 11/2013    DEXA T score -2.7  . Macrocytic anemia 2014    stable B12/folate and periph smear 05/2013, again periph smear 2017 with mature cells, mild dacrocytosis  . Sleep apnea     no CPAP- sleep apnea "cleared up 15 years ago"  . Dizziness     multifactorial, s/p PT at [facility] 06/2014 with HEP  . History of pyelonephritis 05/2014    hospitalization with sepsis  . Closed C7 fracture (HCC) 11/06/2014  . UTI (urinary tract infection) 05/13/2015  . UTI (urinary tract infection) due to Enterococcus 05/13/2015  .  Candidal urethritis in male 06/15/2015   Past Surgical History:  Past Surgical History  Procedure Laterality Date  . Tonsillectomy  1965  . Hernia repair  1979    Left  . Cystoscopy  1992    for kidney stones  . Exploratory laparotomy  1996    with incidental appendectomy  . Appendectomy  1996  . Knee arthroscopy  03/24/02    Right (Dr. Gerrit Heck)  . Orif femoral neck fracture w/ dhs  08/03/02    Right (Dr. Gerrit Heck)  . Ct abd w & pelvis wo cm  07/2001    Scarring of right lung, stable negative o/w  . Ct abd w &  pelvis wo cm  11/2000    ? stones, right LL scarring  . V/q scan  06/1999    negative  . [place]oscopy  1998    [place]  . US echocardiography  12/28/2007    Mild aortic valve calcification EF 55%, basically nml  . Carotid u/s  12/28/2007    nml  . Eeg  11/12/2009    nml  . Mri  11/2009    Head, nml  . Cataract extraction  Jan, March 2011    bilateral  . Colonoscopy  07/2010    5 polyps, adenomatous, rec rpt 3 yrs  . Esophagogastroduodenoscopy  02/2012    dilation of schatzki's ring Christella Hartigan)  . Upper gastrointestinal endoscopy    . Colonoscopy  04/2014    3 polyps, adenomatous, f/u open ended given age Christella Hartigan)  . Cardiac catheterization  2000    30% one vessel  . Cardiovascular stress test  03/2015    no ischemia, low risk, EF 76%   HPI:  82 y.o. male who presents with acute onset of transient speech difficulty. PMH: hyperlipidemia, Schatzki's ring, essential tremor, sleep apnea, chronic kidney disease, esophageal stretching x3 (was scheduled to visit with GI 3/6), essential tremor, DISH, and dysphagia (previous MBS in 2013 recommended a regular diet and thin liquids with intermittent penetration of thin liquids that cleared spontaneously, BSE in 11/2014 suggestive of similar function).   Assessment / Plan / Recommendation Clinical Impression   Pt presents with moderate cognitive deficits characterized by impaired recall of information, decreased sustained attention to tasks, impaired fluency for generative naming, and decreased orientation.  Pt scored 11/22 on the MoCA-Blind (n >/= 18; Blind version administered due to tremor, pt no longer able to write at baseline per family's report) which is consistent with pt's baseline cognitive function per pt's and wife's report.  Pt's speech is fluent and free from dysarthria.  Language is grossly [facility] for all tasks assessed.  Given that pt is at baseline for cognitive-linguistic and swallowing function (see daily treatment note), no further ST  services are warranted at this time.      SLP Assessment  Patient does not need any further Speech Lanaguage Pathology Services    Follow Up Recommendations  None          SLP Evaluation Prior Functioning  Cognitive/Linguistic Baseline: Baseline deficits Baseline deficit details: history of memory deficits  Type of Home: House  Lives With: Spouse Available Help at Discharge: Family   Cognition  Overall Cognitive Status: History of cognitive impairments - at baseline Arousal/Alertness: Awake/alert Orientation Level: Oriented to person;Oriented to place;Oriented to situation;Disoriented to time Attention: Sustained Sustained Attention: Impaired Sustained Attention Impairment: Verbal basic;Functional basic Memory: Impaired Memory Impairment: Retrieval deficit;Decreased short term memory Decreased Short Term Memory: Verbal basic Awareness: Appears intact Problem Solving:  Impaired Problem Solving Impairment: Verbal basic;Functional basic Safety/Judgment: Appears intact    Comprehension  Auditory Comprehension Overall Auditory Comprehension: Appears within functional limits for tasks assessed    Expression Expression Primary Mode of Expression: Verbal Verbal Expression Overall Verbal Expression: Appears within functional limits for tasks assessed   Oral / Motor  Oral Motor/Sensory Function Overall Oral Motor/Sensory Function: Within functional limits Motor Speech Overall Motor Speech: Appears within functional limits for tasks assessed   GO                    Syrenity Klepacki, Melanee Spry 11/13/2015, 2:18 PM

## 2015-11-13 NOTE — Evaluation (Signed)
Physical Therapy Evaluation and Discharge Patient Details Name: [name] MRN: [id number] DOB: [birth year] Today's Date: 11/13/2015   History of Present Illness  Presented with decr speech (expressively), MRI + punctate Lt parietal infarct PMHx- intention tremors bil UEs, pre-DM, CAD, Rt hip fx with ORIF,     Clinical Impression  Patient evaluated by Physical Therapy with no further acute PT needs. All education has been completed and the patient/wife have no further questions. Discussed recent falls and each occurred with pt bending down to put leash on the dog. They have developed new routine to avoid this posture. Patient has had lots of PT (including HHPT) in the past year. He currently goes to a supervised exercise class 2x/week and pt/wife would like to continue this class. If he were to receive HHPT, he could not also attend this class.  See below for any follow-up Physical Therapy or equipment needs. PT is signing off. Thank you for this referral.     Follow Up Recommendations Could benefit from HHPT, however pt/wife would rather continue outpatient supervised exercise class--see above. If wife feels he is not progressing, she knows to discuss with primary care doctor to obtain order for HHPT at that time.    Equipment Recommendations  None recommended by PT    Recommendations for Other Services       Precautions / Restrictions Precautions Precautions: Fall Precaution Comments: wife reports 3 falls in 3 weeks; prior to that it had been months      Mobility  Bed Mobility Overal bed mobility: Modified Independent             General bed mobility comments: has adjustable bed and rail at home; using same here  Transfers Overall transfer level: Needs assistance Equipment used: Rolling walker (2 wheeled) Transfers: Sit to/from Stand Sit to Stand: Min guard;Supervision         General transfer comment: vc and multiple repetitions for proper sequence with RW (for  safety)  Ambulation/Gait Ambulation/Gait assistance: Min guard Ambulation Distance (Feet): 180 Feet Assistive device: Rolling walker (2 wheeled) Gait Pattern/deviations: Step-through pattern;Decreased stride length;Shuffle;Trunk flexed   Gait velocity interpretation: Below normal speed for age/gender General Gait Details: kyphotic posture increases and pt pushes RW too far ahead of his body; can almost fully correct posture to upright, however within 5 feet he resumes kyphotic posture  Stairs            Wheelchair Mobility    Modified Rankin (Stroke Patients Only) Modified Rankin (Stroke Patients Only) Pre-Morbid Rankin Score: Moderately severe disability Modified Rankin: Moderately severe disability     Balance Overall balance assessment: History of Falls;Needs assistance Sitting-balance support: No upper extremity supported;Feet supported Sitting balance-Leahy Scale: Fair     Standing balance support: No upper extremity supported Standing balance-Leahy Scale: Poor Standing balance comment: can stand without UE support, however knees flex and +sway anterior -posterior                             Pertinent Vitals/Pain Pain Assessment: No/denies pain    Home Living Family/patient expects to be discharged to:: Private residence Living Arrangements: Spouse/significant other Available Help at Discharge: Family;Available 24 hours/day Type of Home: House Home Access: Ramped entrance     Home Layout: One level Home Equipment: Walker - 2 wheels;Walker - 4 wheels;Bedside commode;Tub bench;Hand held shower head;Grab bars - tub/shower;Electric scooter (BSC over toilets; scooter can not be transported by car)  Prior Function Level of Independence: Needs assistance   Gait / Transfers Assistance Needed: walks modified Independent with RW; uses rollator when going out; step stool up into bed (wife holds std walker for him to use as a rail as he steps up  sideways onto step)  ADL's / Homemaking Assistance Needed: supervision for shower; assist with socks, shoes, buttons on shirt        Hand Dominance        Extremity/Trunk Assessment   Upper Extremity Assessment: Defer to OT evaluation;Overall WFL for tasks assessed           Lower Extremity Assessment: Overall WFL for tasks assessed      Cervical / Trunk Assessment: Kyphotic  Communication   Communication: No difficulties  Cognition Arousal/Alertness: Awake/alert Behavior During Therapy: WFL for tasks assessed/performed Overall Cognitive Status: History of cognitive impairments - at baseline       Memory: Decreased short-term memory (wife correcting many of his answers )              General Comments General comments (skin integrity, edema, etc.): Wife present throughout    Exercises        Assessment/Plan    PT Assessment All further PT needs can be met in the next venue of care  PT Diagnosis Difficulty walking   PT Problem List Decreased balance;Decreased mobility;Decreased cognition;Decreased knowledge of use of DME;Decreased safety awareness  PT Treatment Interventions     PT Goals (Current goals can be found in the Care Plan section) Acute Rehab PT Goals Patient Stated Goal: go home today PT Goal Formulation: All assessment and education complete, DC therapy    Frequency     Barriers to discharge        Co-evaluation               End of Session Equipment Utilized During Treatment: Gait belt Activity Tolerance: Patient tolerated treatment well Patient left: in chair;with call bell/phone within reach;with family/visitor present (wife refused chair alarm "I'm not leaving...he doesn't need ) Nurse Communication: Mobility status         Time: 1418-1500 PT Time Calculation (min) (ACUTE ONLY): 42 min   Charges:   PT Evaluation $PT Eval Moderate Complexity: 1 Procedure PT Treatments $Gait Training: 8-22 mins (charge adjusted for  time in bathroom)   PT G Codes:        Joda Braatz November 21, 2015, 4:08 PM Pager [phone]

## 2015-11-13 NOTE — Progress Notes (Signed)
PT Cancellation Note  Patient Details Name: [name] MRN: [id number] DOB: 11/23/1930   Cancelled Treatment:    Reason Eval/Treat Not Completed: Other (comment) Bedrest order written after "up with assistance" order. Will ask MD to clarify activity status.   Bobak Oguinn 11/13/2015, 8:14 AM  Pager [phone]

## 2015-11-13 NOTE — Progress Notes (Signed)
  Echocardiogram 2D Echocardiogram has been performed.  [name] 11/13/2015, 2:20 PM

## 2015-11-14 ENCOUNTER — Inpatient Hospital Stay (HOSPITAL_COMMUNITY): Payer: Medicare Other

## 2015-11-14 ENCOUNTER — Encounter (HOSPITAL_COMMUNITY): Payer: Medicare Other

## 2015-11-14 DIAGNOSIS — I639 Cerebral infarction, unspecified: Secondary | ICD-10-CM

## 2015-11-14 DIAGNOSIS — R1319 Other dysphagia: Secondary | ICD-10-CM

## 2015-11-14 DIAGNOSIS — K222 Esophageal obstruction: Secondary | ICD-10-CM

## 2015-11-14 DIAGNOSIS — R1314 Dysphagia, pharyngoesophageal phase: Secondary | ICD-10-CM

## 2015-11-14 DIAGNOSIS — N183 Chronic kidney disease, stage 3 (moderate): Secondary | ICD-10-CM

## 2015-11-14 LAB — HEMOGLOBIN A1C
Hgb A1c MFr Bld: 5.7 % — ABNORMAL HIGH (ref 4.8–5.6)
MEAN PLASMA GLUCOSE: 117 mg/dL

## 2015-11-14 NOTE — Progress Notes (Signed)
VASCULAR LAB PRELIMINARY  PRELIMINARY  PRELIMINARY  PRELIMINARY  Bilateral lower extremity venous duplex completed.     Bilateral:  No evidence of DVT, superficial thrombosis, or Baker's Cyst.   Janifer Adie, RVT, RDMS 11/14/2015, 4:06 PM

## 2015-11-14 NOTE — Progress Notes (Signed)
Occupational Therapy Treatment and Discharge  Patient Details Name: [name] MRN: [id number] DOB: [birth year] Today's Date: 11/14/2015    History of present illness Pt admitted for speech difficulty. MRI revealed infarct in left parietal lobe. PMH includes GERD, CAD, CKD, depression, phlebitis, allergic rhinitis, urge incontinence, HLD, asthma, arthritis, Schatzki's ring, Rt hip fx, hearing loss, essential tremor, DDD, osteoporosis, UTI, dizziness, macrocytic anemia, Rt knee arthroscopy, ORIF femoral neck fracture with dhs (right).    OT comments  Pt and wife confident with his current ADLs status - wife has been assisting him for years, and he has all needed AE and DME.  All education completed.  No further OT needs identified, acute OT will sign off at this time.  Pt and wife agree with this plan   Follow Up Recommendations  No OT follow up    Equipment Recommendations  None recommended by OT    Recommendations for Other Services      Precautions / Restrictions Precautions Precautions: Fall Precaution Comments: wife reports 3 falls in 3 weeks; prior to that it had been months       Mobility Bed Mobility                  Transfers                      Balance                                   ADL                                         General ADL Comments: spoke at length with pt and wife.  They both feel very confident with their current set up  Wife assists pt with ADLs.  He has had OT in the past who has made recommendations for adaptations at home, and who have recommended AE, which he uses.  Wife's only concern is he struggles getting into bed due to elevated bed height and bed cannot be lowered.  They currenlty use a step with wife stabilizing his walker which has been working well for them thus far.  Discussed options available.  Also discussed icing UEs x 10-15 mins prior to activity to decrease tremors for specific  tasks - explained that effects will only be short term so use to reduce tremors to allow him improved success completing specific activities - they verbalized understanding       Vision                     Perception     Praxis      Cognition   Behavior During Therapy: [name] for tasks assessed/performed Overall Cognitive Status: History of cognitive impairments - at baseline                       Extremity/Trunk Assessment               Exercises     Shoulder Instructions       General Comments      Pertinent Vitals/ Pain       Pain Assessment: No/denies pain  Home Living  Prior Functioning/Environment              Frequency       Progress Toward Goals  OT Goals(current goals can now be found in the care plan section)  Progress towards OT goals:  (Education completed )     Plan Other (comment) (pt and wife do not feel he needs further OT )    Co-evaluation                 End of Session     Activity Tolerance Patient tolerated treatment well   Patient Left in chair;with call bell/phone within reach   Nurse Communication          Time: 5188-4166 OT Time Calculation (min): 18 min  Charges: OT General Charges $OT Visit: 1 Procedure OT Treatments $Self Care/Home Management : 8-22 mins  Delayna Sparlin M 11/14/2015, 2:05 PM

## 2015-11-14 NOTE — Progress Notes (Addendum)
[facility] PROGRESS NOTE  MACGYVER CHAMPEAU [id number] DOB: 05-17-1931 DOA: 11/12/2015 PCP: Ria Bush, MD Brief narrative 82 year old male with history of coronary artery disease, see [place] stage III, history of PE (was on anticoagulation for almost 20 years, stopped few years back), degenerative disc disease, since her tremor, schatzki's ring with dysphagia ( s/p dilatation by Dr [name], GERD who was admitted with acute onset of speech impairment. Symptoms improved by the time he came to the ED. Head CT was negative for acute abnormality. MRI of the brain showed a small 4 mm acute ischemic left parietal infarct. MRA showed left M2 short segment severe stenosis.  Assessment/Plan: Acute ischemic left parietal infarct Etiology includes atherosclerotic versus hypercoagulable state. On full dose aspirin. Speech impairment has resolved. No focal neurological weakness. Lipid panel shows elevated triglycerides, LDL of 74. A1c of 5.7. Continue fibrate. Allow permissive hypertension. Discussed with neurology. He has  history of PE in 1992 and treated for almost 20 years with Coumadin, which was stopped due to fall risk.  recommends hypercoagulable workup. 2-D echo with bubble study was unremarkable. Order for Doppler lower extremity to rule out DVT. If DVT is positive she will be started on new anticoagulant. If not we will go ahead with obtaining a TEE. If TEE is negative he will be placed on Plavix.  GERD with history of schatzki's ring Wife reports patient having progressive dysphagia mainly to solid food and was trying to get an appointment to see Dr. [name]. She has an appointment to see his PA early next week. Discussed with Dr. [name] that  patient will need either Plavix or anticoagulation of her discharge and if we need to be stopped for at least 5 days before he gets esophageal  Dilatation. Continue PPI. Have consulted GI for inpt evaluation.  History of CHF Euvolemic. EF of  a  6-70% as per recent echo. Shows grade 1 diastolic dysfunction.  CKD stage III Renal function at baseline. Continue to monitor.  diet: Nothing by mouth after midnight  DVT prophylaxis: Subcutaneous heparin  Code Status: full code Family Communication: wife at bedside Disposition Plan: pending w/up. Needs HHPT   Consultants:  Neurology   [facility]  Procedures:  MRI brain   CT head   2d echo   carotid dopplers  Antibiotics:  none  HPI/Subjective: Seen and examined . denies any weakness or slurred speech  Objective: Filed Vitals:   11/14/15 0512 11/14/15 1038  BP: 115/47 109/53  Pulse: 71 77  Temp: 97.8 F (36.6 C) 98 F (36.7 C)  Resp: 16 18    Intake/Output Summary (Last 24 hours) at 11/14/15 1428 Last data filed at 11/13/15 1900  Gross per 24 hour  Intake    685 ml  Output      0 ml  Net    685 ml   Filed Weights   11/12/15 0727  Weight: 78.472 kg (173 lb)    Exam:   General:  Elderly pleasant male not in distress  HEENT: No pallor, moist mucosa  Chest: Clear bilaterally  CVS: Normal S1 and S2, no murmurs  GI: Nondistended, nontender, bowel sounds present  Musculoskeletal: Warm, no edema  CNS: Alert and oriented, nonfocal, fine resting tremors    Data Reviewed: Basic Metabolic Panel:  Recent Labs Lab 11/12/15 0747 11/12/15 0750 11/12/15 1201 11/13/15 0557  NA 142 143  --  143  K 4.3 4.2  --  4.3  CL 107 104  --  109  CO2  25  --   --  25  GLUCOSE 119* 111*  --  122*  BUN 29* 32*  --  32*  CREATININE 1.52* 1.50* 1.57* 1.70*  CALCIUM 9.6  --   --  9.2   Liver Function Tests:  Recent Labs Lab 11/12/15 0747  AST 30  ALT 24  ALKPHOS 52  BILITOT 1.2  PROT 6.2*  ALBUMIN 3.5   No results for input(s): LIPASE, AMYLASE in the last 168 hours. No results for input(s): AMMONIA in the last 168 hours. CBC:  Recent Labs Lab 11/12/15 0747 11/12/15 0750 11/12/15 1201 11/13/15 0557  WBC 5.8  --  5.9 6.3  NEUTROABS 3.4   --   --   --   HGB 11.8* 12.2* 10.9* 10.2*  HCT 35.2* 36.0* 33.1* 30.3*  MCV 111.7*  --  111.4* 111.4*  PLT 127*  --  120* 110*   Cardiac Enzymes: No results for input(s): CKTOTAL, CKMB, CKMBINDEX, TROPONINI in the last 168 hours. BNP (last 3 results) No results for input(s): BNP in the last 8760 hours.  ProBNP (last 3 results) No results for input(s): PROBNP in the last 8760 hours.  CBG: No results for input(s): GLUCAP in the last 168 hours.  No results found for this or any previous visit (from the past 240 hour(s)).   Studies: Mr Angiogram Neck W Wo Contrast  11/13/2015  CLINICAL DATA:  Initial evaluation for new onset expressive aphasia. EXAM: MR HEAD WITHOUT CONTRAST MR CIRCLE OF WILLIS WITHOUT CONTRAST MRA OF THE NECK WITHOUT AND WITH CONTRAST TECHNIQUE: Multiplanar, multiecho pulse sequences of the brain and surrounding structures were obtained according to standard protocol without intravenous contrast.; Multiplanar and multiecho pulse sequences of the neck were obtained without and with intravenous contrast. Angiographic images of the neck were obtained using MRA technique without and with intravenous contrast.; Angiographic images of the Circle of Willis were obtained using MRA technique without intravenous contrast. CONTRAST:  79mL MULTIHANCE GADOBENATE DIMEGLUMINE 529 MG/ML IV SOLN COMPARISON:  Prior CT from re-/ 6/17. FINDINGS: MR HEAD FINDINGS Diffusion-weighted imaging demonstrates a punctate 4 mm focus of restricted diffusion within the cortical gray matter of the left parietal lobe, consistent with a tiny acute ischemic infarct (series 5, image 27). No associated hemorrhage. No other infarct. Gray-white matter differentiation otherwise maintained. Major intracranial vascular flow voids are preserved. Diffuse prominence of the CSF containing spaces is compatible with generalized age-related cerebral atrophy. Patchy and confluent T2/FLAIR hyperintensity within the periventricular  and deep white matter both cerebral hemispheres most consistent with chronic small vessel ischemic disease, moderate nature. Small vessel type changes present within the pons. No remote infarction identified. No acute or chronic intracranial hemorrhage. No mass lesion, midline shift, or mass effect. Ventricular prominence related to global parenchymal volume loss present without hydrocephalus. No extra-axial fluid collection. Craniocervical junction within normal limits. Mild degenerative spondylolysis within the visualized upper cervical spine. Pituitary gland normal. No acute abnormality about the orbits. Sequela prior bilateral lens extraction noted. Mild mucosal thickening within the max O sinuses and ethmoidal air cells. Bilateral concha bullosa noted. No nasal septal deviation. Middle air cavities grossly clear. Inner ear structures normal. Bone marrow signal intensity within normal limits. No scalp soft tissue abnormality. MR CIRCLE OF WILLIS FINDINGS ANTERIOR CIRCULATION: Visualized distal cervical segments of the internal carotid arteries are patent with antegrade flow. Petrous, cavernous, and supraclinoid segments widely patent. A1 segments, anterior communicating artery, and anterior cerebral arteries well opacified. M1 segments widely patent without stenosis  or occlusion. MCA bifurcations within normal limits. There is an apparent focal high-grade stenosis within a proximal right M2 branch (series 601, image 12). This is nonocclusive of flow seen distally. Attenuation within the more superior right M2 branch favored to be related to motion. No other focal high-grade stenosis. MCA branches demonstrate mild distal small vessel branch irregularity bilaterally. MCA branches are fairly symmetric with the regional hypoperfusion. POSTERIOR CIRCULATION: Vertebral arteries patent to the vertebrobasilar junction. Posterior inferior cerebellar arteries patent bilaterally. Basilar artery widely patent to its distal  aspect. Superior cerebellar arteries patent bilaterally. Both posterior cerebral arteries arise from the basilar artery and are well opacified to their distal aspects. Mild distal small vessel irregularity within the PCAs bilaterally. No aneurysm or vascular malformation. MRA NECK FINDINGS Visualized aortic arch is of normal caliber with normal branch pattern. No high-grade stenosis at the origin of the great vessels. Visualized subclavian arteries widely patent. Right common carotid artery patent from its origin to the bifurcation. Probable minimal a centric plaque about the bifurcation without stenosis. Right ICA widely patent from the bifurcation to the skullbase without high-grade stenosis or occlusion. Left common carotid artery patent from its origin to the bifurcation. No significant atheromatous disease about the bifurcation appreciated. Left ICA widely patent from the bifurcation to the skullbase. No flow-limiting stenosis or occlusion within the left carotid artery system. The left vertebral artery appears to arise separately from the aortic arch. Right vertebral artery arises from the subclavian artery. Vertebral arteries are widely patent along their entire course without stenosis or vascular occlusion. IMPRESSION: MRI HEAD IMPRESSION: 1. Punctate 4 mm ischemic cortical left parietal lobe infarct. No associated hemorrhage. 2. No other acute intracranial process. 3. Generalized age-related cerebral atrophy with moderate chronic small vessel ischemic disease. MRA HEAD IMPRESSION: 1. No large or proximal arterial branch occlusion within the intracranial circulation. 2. Single short-segment severe nonocclusive right M2 stenosis as above. 3. No other focal severe or correctable stenosis identified within the intracranial circulation. 4. Mild distal small vessel atheromatous irregularity within the MCA and PCA branches bilaterally MRA NECK IMPRESSION: Negative MRA of the neck. No critical or high-grade  flow-limiting stenosis identified. Electronically Signed   By: Jeannine Boga M.D.   On: 11/13/2015 03:06   Mr Brain Wo Contrast  11/13/2015  CLINICAL DATA:  Initial evaluation for new onset expressive aphasia. EXAM: MR HEAD WITHOUT CONTRAST MR CIRCLE OF WILLIS WITHOUT CONTRAST MRA OF THE NECK WITHOUT AND WITH CONTRAST TECHNIQUE: Multiplanar, multiecho pulse sequences of the brain and surrounding structures were obtained according to standard protocol without intravenous contrast.; Multiplanar and multiecho pulse sequences of the neck were obtained without and with intravenous contrast. Angiographic images of the neck were obtained using MRA technique without and with intravenous contrast.; Angiographic images of the Circle of Willis were obtained using MRA technique without intravenous contrast. CONTRAST:  18mL MULTIHANCE GADOBENATE DIMEGLUMINE 529 MG/ML IV SOLN COMPARISON:  Prior CT from re-/ 6/17. FINDINGS: MR HEAD FINDINGS Diffusion-weighted imaging demonstrates a punctate 4 mm focus of restricted diffusion within the cortical gray matter of the left parietal lobe, consistent with a tiny acute ischemic infarct (series 5, image 27). No associated hemorrhage. No other infarct. Gray-white matter differentiation otherwise maintained. Major intracranial vascular flow voids are preserved. Diffuse prominence of the CSF containing spaces is compatible with generalized age-related cerebral atrophy. Patchy and confluent T2/FLAIR hyperintensity within the periventricular and deep white matter both cerebral hemispheres most consistent with chronic small vessel ischemic disease, moderate nature. Small vessel  type changes present within the pons. No remote infarction identified. No acute or chronic intracranial hemorrhage. No mass lesion, midline shift, or mass effect. Ventricular prominence related to global parenchymal volume loss present without hydrocephalus. No extra-axial fluid collection. Craniocervical  junction within normal limits. Mild degenerative spondylolysis within the visualized upper cervical spine. Pituitary gland normal. No acute abnormality about the orbits. Sequela prior bilateral lens extraction noted. Mild mucosal thickening within the max O sinuses and ethmoidal air cells. Bilateral concha bullosa noted. No nasal septal deviation. Middle air cavities grossly clear. Inner ear structures normal. Bone marrow signal intensity within normal limits. No scalp soft tissue abnormality. MR CIRCLE OF WILLIS FINDINGS ANTERIOR CIRCULATION: Visualized distal cervical segments of the internal carotid arteries are patent with antegrade flow. Petrous, cavernous, and supraclinoid segments widely patent. A1 segments, anterior communicating artery, and anterior cerebral arteries well opacified. M1 segments widely patent without stenosis or occlusion. MCA bifurcations within normal limits. There is an apparent focal high-grade stenosis within a proximal right M2 branch (series 601, image 12). This is nonocclusive of flow seen distally. Attenuation within the more superior right M2 branch favored to be related to motion. No other focal high-grade stenosis. MCA branches demonstrate mild distal small vessel branch irregularity bilaterally. MCA branches are fairly symmetric with the regional hypoperfusion. POSTERIOR CIRCULATION: Vertebral arteries patent to the vertebrobasilar junction. Posterior inferior cerebellar arteries patent bilaterally. Basilar artery widely patent to its distal aspect. Superior cerebellar arteries patent bilaterally. Both posterior cerebral arteries arise from the basilar artery and are well opacified to their distal aspects. Mild distal small vessel irregularity within the PCAs bilaterally. No aneurysm or vascular malformation. MRA NECK FINDINGS Visualized aortic arch is of normal caliber with normal branch pattern. No high-grade stenosis at the origin of the great vessels. Visualized subclavian  arteries widely patent. Right common carotid artery patent from its origin to the bifurcation. Probable minimal a centric plaque about the bifurcation without stenosis. Right ICA widely patent from the bifurcation to the skullbase without high-grade stenosis or occlusion. Left common carotid artery patent from its origin to the bifurcation. No significant atheromatous disease about the bifurcation appreciated. Left ICA widely patent from the bifurcation to the skullbase. No flow-limiting stenosis or occlusion within the left carotid artery system. The left vertebral artery appears to arise separately from the aortic arch. Right vertebral artery arises from the subclavian artery. Vertebral arteries are widely patent along their entire course without stenosis or vascular occlusion. IMPRESSION: MRI HEAD IMPRESSION: 1. Punctate 4 mm ischemic cortical left parietal lobe infarct. No associated hemorrhage. 2. No other acute intracranial process. 3. Generalized age-related cerebral atrophy with moderate chronic small vessel ischemic disease. MRA HEAD IMPRESSION: 1. No large or proximal arterial branch occlusion within the intracranial circulation. 2. Single short-segment severe nonocclusive right M2 stenosis as above. 3. No other focal severe or correctable stenosis identified within the intracranial circulation. 4. Mild distal small vessel atheromatous irregularity within the MCA and PCA branches bilaterally MRA NECK IMPRESSION: Negative MRA of the neck. No critical or high-grade flow-limiting stenosis identified. Electronically Signed   By: Jeannine Boga M.D.   On: 11/13/2015 03:06   Mr Jodene Nam Head/brain Wo Cm  11/13/2015  CLINICAL DATA:  Initial evaluation for new onset expressive aphasia. EXAM: MR HEAD WITHOUT CONTRAST MR CIRCLE OF WILLIS WITHOUT CONTRAST MRA OF THE NECK WITHOUT AND WITH CONTRAST TECHNIQUE: Multiplanar, multiecho pulse sequences of the brain and surrounding structures were obtained according to  standard protocol without intravenous contrast.; Multiplanar  and multiecho pulse sequences of the neck were obtained without and with intravenous contrast. Angiographic images of the neck were obtained using MRA technique without and with intravenous contrast.; Angiographic images of the Circle of Willis were obtained using MRA technique without intravenous contrast. CONTRAST:  40mL MULTIHANCE GADOBENATE DIMEGLUMINE 529 MG/ML IV SOLN COMPARISON:  Prior CT from re-/ 6/17. FINDINGS: MR HEAD FINDINGS Diffusion-weighted imaging demonstrates a punctate 4 mm focus of restricted diffusion within the cortical gray matter of the left parietal lobe, consistent with a tiny acute ischemic infarct (series 5, image 27). No associated hemorrhage. No other infarct. Gray-white matter differentiation otherwise maintained. Major intracranial vascular flow voids are preserved. Diffuse prominence of the CSF containing spaces is compatible with generalized age-related cerebral atrophy. Patchy and confluent T2/FLAIR hyperintensity within the periventricular and deep white matter both cerebral hemispheres most consistent with chronic small vessel ischemic disease, moderate nature. Small vessel type changes present within the pons. No remote infarction identified. No acute or chronic intracranial hemorrhage. No mass lesion, midline shift, or mass effect. Ventricular prominence related to global parenchymal volume loss present without hydrocephalus. No extra-axial fluid collection. Craniocervical junction within normal limits. Mild degenerative spondylolysis within the visualized upper cervical spine. Pituitary gland normal. No acute abnormality about the orbits. Sequela prior bilateral lens extraction noted. Mild mucosal thickening within the max O sinuses and ethmoidal air cells. Bilateral concha bullosa noted. No nasal septal deviation. Middle air cavities grossly clear. Inner ear structures normal. Bone marrow signal intensity within  normal limits. No scalp soft tissue abnormality. MR CIRCLE OF WILLIS FINDINGS ANTERIOR CIRCULATION: Visualized distal cervical segments of the internal carotid arteries are patent with antegrade flow. Petrous, cavernous, and supraclinoid segments widely patent. A1 segments, anterior communicating artery, and anterior cerebral arteries well opacified. M1 segments widely patent without stenosis or occlusion. MCA bifurcations within normal limits. There is an apparent focal high-grade stenosis within a proximal right M2 branch (series 601, image 12). This is nonocclusive of flow seen distally. Attenuation within the more superior right M2 branch favored to be related to motion. No other focal high-grade stenosis. MCA branches demonstrate mild distal small vessel branch irregularity bilaterally. MCA branches are fairly symmetric with the regional hypoperfusion. POSTERIOR CIRCULATION: Vertebral arteries patent to the vertebrobasilar junction. Posterior inferior cerebellar arteries patent bilaterally. Basilar artery widely patent to its distal aspect. Superior cerebellar arteries patent bilaterally. Both posterior cerebral arteries arise from the basilar artery and are well opacified to their distal aspects. Mild distal small vessel irregularity within the PCAs bilaterally. No aneurysm or vascular malformation. MRA NECK FINDINGS Visualized aortic arch is of normal caliber with normal branch pattern. No high-grade stenosis at the origin of the great vessels. Visualized subclavian arteries widely patent. Right common carotid artery patent from its origin to the bifurcation. Probable minimal a centric plaque about the bifurcation without stenosis. Right ICA widely patent from the bifurcation to the skullbase without high-grade stenosis or occlusion. Left common carotid artery patent from its origin to the bifurcation. No significant atheromatous disease about the bifurcation appreciated. Left ICA widely patent from the  bifurcation to the skullbase. No flow-limiting stenosis or occlusion within the left carotid artery system. The left vertebral artery appears to arise separately from the aortic arch. Right vertebral artery arises from the subclavian artery. Vertebral arteries are widely patent along their entire course without stenosis or vascular occlusion. IMPRESSION: MRI HEAD IMPRESSION: 1. Punctate 4 mm ischemic cortical left parietal lobe infarct. No associated hemorrhage. 2. No other acute  intracranial process. 3. Generalized age-related cerebral atrophy with moderate chronic small vessel ischemic disease. MRA HEAD IMPRESSION: 1. No large or proximal arterial branch occlusion within the intracranial circulation. 2. Single short-segment severe nonocclusive right M2 stenosis as above. 3. No other focal severe or correctable stenosis identified within the intracranial circulation. 4. Mild distal small vessel atheromatous irregularity within the MCA and PCA branches bilaterally MRA NECK IMPRESSION: Negative MRA of the neck. No critical or high-grade flow-limiting stenosis identified. Electronically Signed   By: Jeannine Boga M.D.   On: 11/13/2015 03:06    Scheduled Meds: . antiseptic oral rinse  7 mL Mouth Rinse q12n4p  . aspirin  300 mg Rectal Daily   Or  . aspirin  325 mg Oral Daily  . chlorhexidine  15 mL Mouth Rinse BID  . fenofibrate  54 mg Oral q morning - 10a  . gabapentin  200 mg Oral TID  . heparin  5,000 Units Subcutaneous 3 times per day  . oxybutynin  5 mg Oral BID  . pantoprazole  40 mg Oral Daily  . venlafaxine  75 mg Oral Daily   Continuous Infusions: . sodium chloride 50 mL/hr at 11/13/15 1534      Time spent: [place], [place]  [facility] Pager [phone] If 7PM-7AM, please contact night-coverage at [email or web address], password [facility] 11/14/2015, 2:28 PM  LOS: 2 days

## 2015-11-14 NOTE — Progress Notes (Addendum)
    [facility] has been requested to perform a transesophageal echocardiogram on 11/15/2015 for CVA.  After careful review of history and examination, the risks and benefits of transesophageal echocardiogram have been explained including risks of esophageal damage, perforation (1:10,000 risk), bleeding, pharyngeal hematoma as well as other potential complications associated with conscious sedation including aspiration, arrhythmia, respiratory failure and death. Alternatives to treatment were discussed, questions were answered. Patient is willing to proceed.   He has a history of esophageal stricture and dilatation, last time in 2015. He is having more problems swallowing now, with solid food and occasionally with liquids. Had appt w/ PA for Dr [name] but had to cancel it due to current admission.  Reviewed records, Schatzki's ring at the GE junction was dilated last 02/2014 with 18 mm balloon. No upper endoscopy problems.  Lenoard Aden 11/14/2015 3:25 PM

## 2015-11-14 NOTE — Consult Note (Signed)
Referring Provider: Dr. Clementeen Graham Primary Care Physician:  Ria Bush, MD Primary Gastroenterologist:  Dr. [name]  Reason for Consultation:  Dysphagia; history of Schatzki's ring; may need anticoagulation  HPI: [name] is a 82 y.o. male with history of coronary artery disease, CKD stage III, history of PE (was on anticoagulation for almost 20 years, stopped few years back), degenerative disc disease, essential tremor, schatzki's ring with dysphagia (s/p dilatation by Dr [name] last in 02/2014), and GERD who was admitted with acute onset of speech impairment on 3/6.  Was found to have an acute ischemic left parietal infarct.  Evaluation is underway to rule out atherosclerotic source vs hypercoagulable state.  Going to get a TEE as well.  GI was called because patient reports dysphagia that has been present for the past several weeks again and had an appt in our office yesterday to be evaluated and consider repeat EGD with dilation.  He may need anti-platelet or anti-coagulation so asking GI to consider inpatient EGD with dilation prior to asking any of that.  He and his wife report mostly dysphagia to solid food such as bread, some meats, popcorn, and his pills.  Occasionally issues with swallowing liquids.  No weight loss, in fact he's had weight gain.    Last EGD was 02/2014 at which time Schatzki's ring was found at the GE junction and was dilated with balloon to 18 mm.  Area was also biopsied because it seemed irregular but biopsies showed benign mucosa with reactive changes.    Past Medical History  Diagnosis Date  . GERD (gastroesophageal reflux disease)     h/o PUD  . CAD (coronary artery disease)     cath 2000 30% single vessel, normal nuclear stress test 12/03/2010, no evidence ischemia  . CKD (chronic kidney disease) stage 3, GFR 30-59 ml/min     baseline Cr 1.7  . Depression   . Hx pulmonary embolism 08/1991    negative hypercoagulable panel 12/2014  . History of  phlebitis   . Allergic rhinitis   . Urge incontinence   . Elevated PSA     previous-normalized (followed by Dr. Jeffie Pollock, rec no repeat unless urinary sxs)  . HLD (hyperlipidemia)     hypertriglyceridemia  . Asthma     remote  . Arthritis   . Blood transfusion 1990's  . Schatzki's ring 02/2012    s/p dilation [name]  . History of fracture of right hip   . Hearing loss 06/2012    eval - rec annual exam  . Essential tremor 09/27/2008    reviewed eval by Dr Jannifer Franklin in chart 2011   . Prediabetes   . DISH (diffuse idiopathic skeletal hyperostosis) 03/2013    lumbar spine on xray  . DDD (degenerative disc disease) 03/2013    by CT, diffuse multilevel cervical and lumbar spondylosis  . Osteoporosis 11/2013    DEXA T score -2.7  . Macrocytic anemia 2014    stable B12/folate and periph smear 05/2013, again periph smear 2017 with mature cells, mild dacrocytosis  . Sleep apnea     no CPAP- sleep apnea "cleared up 15 years ago"  . Dizziness     multifactorial, s/p PT at [facility] 06/2014 with HEP  . History of pyelonephritis 05/2014    hospitalization with sepsis  . Closed C7 fracture ([place]) 11/06/2014  . UTI (urinary tract infection) 05/13/2015  . UTI (urinary tract infection) due to Enterococcus 05/13/2015  . Candidal urethritis in male 06/15/2015  Past Surgical History  Procedure Laterality Date  . Tonsillectomy  1965  . Hernia repair  1979    Left  . Cystoscopy  1992    for kidney stones  . Exploratory laparotomy  1996    with incidental appendectomy  . Appendectomy  1996  . Knee arthroscopy  03/24/02    Right (Dr. Mauri Pole)  . Orif femoral neck fracture w/ dhs  08/03/02    Right (Dr. Mauri Pole)  . Ct abd w & pelvis wo cm  07/2001    Scarring of right lung, stable negative o/w  . Ct abd w & pelvis wo cm  11/2000    ? stones, right LL scarring  . V/q scan  06/1999    negative  . [place]oscopy  1998    [place]  . US echocardiography  12/28/2007    Mild aortic valve calcification EF 55%,  basically nml  . Carotid u/s  12/28/2007    nml  . Eeg  11/12/2009    nml  . Mri  11/2009    Head, nml  . Cataract extraction  Jan, March 2011    bilateral  . Colonoscopy  07/2010    5 polyps, adenomatous, rec rpt 3 yrs  . Esophagogastroduodenoscopy  02/2012    dilation of schatzki's ring [name]  . Upper gastrointestinal endoscopy    . Colonoscopy  04/2014    3 polyps, adenomatous, f/u open ended given age [name]  . Cardiac catheterization  2000    30% one vessel  . Cardiovascular stress test  03/2015    no ischemia, low risk, EF 76%    Prior to Admission medications   Medication Sig Start Date End Date Taking? Authorizing Provider  aspirin EC 81 MG tablet Take 81 mg by mouth daily.   Yes Historical Provider, MD  Biotin 2500 MCG CAPS Take 1 capsule by mouth daily.   Yes Historical Provider, MD  Cholecalciferol (VITAMIN D) 2000 UNITS CAPS Take 1 capsule by mouth daily.   Yes Historical Provider, MD  clotrimazole (LOTRIMIN) 1 % cream Apply 1 application topically 2 (two) times daily. Patient taking differently: Apply 1 application topically 2 (two) times daily as needed. For rash 05/02/15  Yes Pleas Koch, NP  Coenzyme Q10 (COQ-10) 100 MG CAPS Take 1 capsule by mouth daily.    Yes Historical Provider, MD  Cyanocobalamin (B-12 PO) Take 10,000 mcg by mouth every Monday, Wednesday, and Friday.    Yes Historical Provider, MD  dexlansoprazole (DEXILANT) 60 MG capsule Take 1 capsule (60 mg total) by mouth daily. 11/01/15  Yes Milus Banister, MD  docusate sodium (COLACE) 100 MG capsule Take 100 mg by mouth at bedtime.    Yes Historical Provider, MD  doxycycline (VIBRA-TABS) 100 MG tablet Take 1 tablet (100 mg total) by mouth 2 (two) times daily. 10/31/15  Yes Pleas Koch, NP  fenofibrate (TRICOR) 145 MG tablet TAKE 1 TABLET DAILY 08/23/15  Yes Ria Bush, MD  fexofenadine (ALLEGRA) 180 MG tablet Take 180 mg by mouth daily.    Yes Historical Provider, MD  gabapentin  (NEURONTIN) 100 MG capsule Take 2 capsules (200 mg total) by mouth 3 (three) times daily. Patient taking differently: Take 200-300 mg by mouth 2 (two) times daily. Take 3 capsules in the morning Take 2 capsules in the evening 09/21/15  Yes Kathrynn Ducking, MD  Multiple Vitamins-Minerals (MULTIVITAMIN PO) Take by mouth daily.   Yes Historical Provider, MD  oxybutynin (DITROPAN) 5 MG tablet  Take 5 mg by mouth 2 (two) times daily.    Yes Historical Provider, MD  venlafaxine (EFFEXOR) 75 MG tablet Take 75 mg by mouth daily.   Yes Historical Provider, MD  albuterol (PROVENTIL HFA;VENTOLIN HFA) 108 (90 BASE) MCG/ACT inhaler Inhale 2 puffs into the lungs every 6 (six) hours as needed for wheezing or shortness of breath.    Historical Provider, MD  guaiFENesin (MUCINEX) 600 MG 12 hr tablet Take 1 tablet (600 mg total) by mouth 2 (two) times daily. Patient not taking: Reported on 11/12/2015 11/09/14   Reyne Dumas, MD    Current Facility-Administered Medications  Medication Dose Route Frequency Provider Last Rate Last Dose  . 0.9 %  sodium chloride infusion   Intravenous Continuous Rondel Jumbo, PA-C 50 mL/hr at 11/13/15 1534    . acetaminophen (TYLENOL) tablet 650 mg  650 mg Oral Q4H PRN Jeryl Columbia, NP   650 mg at 11/12/15 2155  . antiseptic oral rinse (CPC / CETYLPYRIDINIUM CHLORIDE 0.05%) solution 7 mL  7 mL Mouth Rinse q12n4p Waldemar Dickens, MD   7 mL at 11/14/15 1252  . aspirin suppository 300 mg  300 mg Rectal Daily Rondel Jumbo, PA-C   300 mg at 11/12/15 1330   Or  . aspirin tablet 325 mg  325 mg Oral Daily Rondel Jumbo, PA-C   325 mg at 11/14/15 1002  . chlorhexidine (PERIDEX) 0.12 % solution 15 mL  15 mL Mouth Rinse BID Waldemar Dickens, MD   15 mL at 11/14/15 1003  . fenofibrate tablet 54 mg  54 mg Oral q morning - 10a Rondel Jumbo, PA-C   54 mg at 11/14/15 1002  . gabapentin (NEURONTIN) capsule 200 mg  200 mg Oral TID Velvet Bathe, MD   200 mg at 11/14/15 1002  . heparin injection  5,000 Units  5,000 Units Subcutaneous 3 times per day Rondel Jumbo, PA-C   5,000 Units at 11/14/15 1252  . oxybutynin (DITROPAN) tablet 5 mg  5 mg Oral BID Rondel Jumbo, PA-C   5 mg at 11/14/15 1002  . pantoprazole (PROTONIX) EC tablet 40 mg  40 mg Oral Daily Velvet Bathe, MD   40 mg at 11/14/15 1002  . venlafaxine (EFFEXOR) tablet 75 mg  75 mg Oral Daily Rondel Jumbo, PA-C   75 mg at 11/14/15 1002    Allergies as of 11/12/2015 - Review Complete 11/12/2015  Allergen Reaction Noted  . Lactose intolerance (gi)  02/20/2014  . Nizatidine    . Sulfadiazine    . Aricept [donepezil hcl] Rash 09/04/2015  . Lipitor [atorvastatin] Rash 09/04/2015  . Penicillins Rash     Family History  Problem Relation Age of Onset  . Stroke Mother   . Hypertension Mother   . Cancer Brother     prostate with mets  . Blindness Brother     legally  . Diabetes Brother   . Pulmonary embolism Sister     from shoulder operation  . Alcohol abuse Brother   . Colon cancer Neg Hx   . Esophageal cancer Neg Hx   . Rectal cancer Neg Hx   . Stomach cancer Neg Hx     Social History   Social History  . Marital Status: Married    Spouse Name: N/A  . Number of Children: 2  . Years of Education: N/A   Occupational History  . 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 Smoking status: Never Smoker   . Smokeless tobacco: Never Used  . Alcohol Use: No  . Drug Use: No  . Sexual Activity: Not on file   Other Topics Concern  . Not on file   Social History Narrative   Married with 2 children   Husband of [name]    Retired: was principal    Activity: walks dog 2-3 times daily about 52min, frequent stops    Diet: some water, good fruits/vegetables, fish 1x/wk, no sodas.      Advanced directives: [name] is wife, [name]. Does not want prolonged life support if terminal      Patient does not drink caffeine.   Patient is right handed.     Review of  Systems: Ten point ROS is O/W negative except as mentioned in HPI.  Physical Exam: Vital signs in last 24 hours: Temp:  [97.8 F (36.6 C)-98.6 F (37 C)] 98.6 F (37 C) (03/08 1300) Pulse Rate:  [68-77] 68 (03/08 1300) Resp:  [16-20] 20 (03/08 1300) BP: (101-122)/(44-58) 115/58 mmHg (03/08 1300) SpO2:  [95 %-99 %] 97 % (03/08 1300) Last BM Date: 11/11/15 General:  Alert, Well-developed, well-nourished, pleasant and cooperative in NAD Head:  Normocephalic and atraumatic. Eyes:  Sclera clear, no icterus.  Conjunctiva pink. Ears:  Normal auditory acuity. Mouth:  No deformity or lesions.   Lungs:  Clear throughout to auscultation.  No wheezes, crackles, or rhonchi.  Heart:  Regular rate and rhythm; no murmurs, clicks, rubs, or gallops. Abdomen:  Soft, non-distended.  BS present.  Non-tender.   Msk:  Symmetrical without gross deformities. Pulses:  Normal pulses noted. Extremities:  Without clubbing or edema. Neurologic:  Alert and oriented x 4;  grossly normal neurologically.  Fine resting tremor noted. Skin:  Intact without significant lesions or rashes. Psych:  Alert and cooperative. Normal mood and affect.  Intake/Output from previous day: 03/07 0701 - 03/08 0700 In: 2555.8 [P.O.:1080; I.V.:1475.8] Out: -   Lab Results:  Recent Labs  11/12/15 0747 11/12/15 0750 11/12/15 1201 11/13/15 0557  WBC 5.8  --  5.9 6.3  HGB 11.8* 12.2* 10.9* 10.2*  HCT 35.2* 36.0* 33.1* 30.3*  PLT 127*  --  120* 110*   BMET  Recent Labs  11/12/15 0747 11/12/15 0750 11/12/15 1201 11/13/15 0557  NA 142 143  --  143  K 4.3 4.2  --  4.3  CL 107 104  --  109  CO2 25  --   --  25  GLUCOSE 119* 111*  --  122*  BUN 29* 32*  --  32*  CREATININE 1.52* 1.50* 1.57* 1.70*  CALCIUM 9.6  --   --  9.2   LFT  Recent Labs  11/12/15 0747  PROT 6.2*  ALBUMIN 3.5  AST 30  ALT 24  ALKPHOS 52  BILITOT 1.2   PT/INR  Recent Labs  11/12/15 0747  LABPROT 15.3*  INR 1.19     IMPRESSION:   -History of Schatzki's ring with last dilation in 02/2014 and now with progressive solid food dysphagia recently.  May need anti-platelet or anti-coagulation so ideally this should be addressed before placing him on these agents. -CVA:  Evaluation for source in process.  Cardiology planning TEE as well, which they will plan to do on 3/10.  PLAN: -Will plan for EGD with possible dilation on 3/9 at 3:30 pm tentatively. -Already on pantoprazole 40 mg daily.  ZEHR, JESSICA D.  11/14/2015, 3:47 PM  Pager number  [place] Attending   I have taken an interval history, reviewed the chart and examined the patient. I agree with the Advanced Practitioner's note, impression and recommendations.    i have reviewed prior GI records and stricture dilation in particular as well as hospital records.  EGD w/ stricture dilation sensible in his case. The risks and benefits as well as alternatives of endoscopic procedure(s) have been discussed and reviewed. All questions answered. The patient agrees to proceed.  Gatha Mayer, MD, [facility] [phone] (pager) [phone] after 5 PM, weekends and holidays  11/14/2015 5:42 PM

## 2015-11-14 NOTE — Progress Notes (Signed)
STROKE TEAM PROGRESS NOTE   HISTORY OF PRESENT ILLNESS [name] is an 82 y.o. male with a history of hyperlipidemia, Schatzki's ring, essential tremor, sleep apnea and chronic kidney disease, brought to the emergency room following new onset of speech output difficulty. Patient was last known well at 2 AM today 11/12/2015. When he woke up at 8 AM his wife noticed that his speech was unintelligible. Speech has improved since his arrived in the emergency room. There's no previous history of stroke nor TIA. Patient has been taking aspirin daily. CT scan of his head showed stable atrophy and chronic white matter changes, but no acute abnormality. No weakness or numbness involving face or extremities was experienced. Patient was not administered IV t-PA secondary to deficit resolved. He was admitted for further evaluation and treatment.   SUBJECTIVE (INTERVAL HISTORY) His wife is at the bedside.  Overall he feels his condition is completely resolved. Patient is sitting up in a chair. No new complaints. He and wife shared HPI with Dr. Leonie Man - expressive aphasia, no receptive asphasia,  life long history of essential tremors. Recent history of balance difficulties and falls. Has mild dementia. He is followed at [facility] with Dr. Jannifer Franklin   OBJECTIVE Temp:  [97.8 F (36.6 C)-98.4 F (36.9 C)] 97.8 F (36.6 C) (03/08 0512) Pulse Rate:  [69-75] 71 (03/08 0512) Cardiac Rhythm:  [-] Normal sinus rhythm (03/08 0700) Resp:  [16-18] 16 (03/08 0512) BP: (101-122)/(44-54) 115/47 mmHg (03/08 0512) SpO2:  [95 %-100 %] 96 % (03/08 0512)  CBC:  Recent Labs Lab 11/12/15 0747  11/12/15 1201 11/13/15 0557  WBC 5.8  --  5.9 6.3  NEUTROABS 3.4  --   --   --   HGB 11.8*  < > 10.9* 10.2*  HCT 35.2*  < > 33.1* 30.3*  MCV 111.7*  --  111.4* 111.4*  PLT 127*  --  120* 110*  < > = values in this interval not displayed.  Basic Metabolic Panel:   Recent Labs Lab 11/12/15 0747  11/12/15 0750 11/12/15 1201 11/13/15 0557  NA 142 143  --  143  K 4.3 4.2  --  4.3  CL 107 104  --  109  CO2 25  --   --  25  GLUCOSE 119* 111*  --  122*  BUN 29* 32*  --  32*  CREATININE 1.52* 1.50* 1.57* 1.70*  CALCIUM 9.6  --   --  9.2    Lipid Panel:     Component Value Date/Time   CHOL 137 11/13/2015 0557   TRIG 248* 11/13/2015 0557   HDL 13* 11/13/2015 0557   CHOLHDL 10.5 11/13/2015 0557   VLDL 50* 11/13/2015 0557   LDLCALC 74 11/13/2015 0557   HgbA1c:  Lab Results  Component Value Date   HGBA1C 5.7* 11/13/2015   Urine Drug Screen:     Component Value Date/Time   LABOPIA NONE DETECTED 11/12/2015 1143   COCAINSCRNUR NONE DETECTED 11/12/2015 1143   LABBENZ NONE DETECTED 11/12/2015 1143   AMPHETMU NONE DETECTED 11/12/2015 1143   THCU NONE DETECTED 11/12/2015 1143   LABBARB NONE DETECTED 11/12/2015 1143      IMAGING  Dg Chest 2 View 11/12/2015  Cardiomegaly.  No acute cardiopulmonary process.   Ct Head Wo Contrast 11/12/2015  No acute intracranial abnormality. Stable atrophy and chronic white matter disease. No definite acute cortical infarction.   MRI HEAD  11/13/2015  1. Punctate 4 mm ischemic cortical left parietal lobe  infarct. No associated hemorrhage. 2. No other acute intracranial process. 3. Generalized age-related cerebral atrophy with moderate chronic small vessel ischemic disease.   MRA HEAD  11/13/2015  1. No large or proximal arterial branch occlusion within the intracranial circulation. 2. Single short-segment severe nonocclusive right M2 stenosis as above. 3. No other focal severe or correctable stenosis identified within the intracranial circulation. 4. Mild distal small vessel atheromatous irregularity within the MCA and PCA branches bilaterally   MRA NECK  11/13/2015  Negative MRA of the neck. No critical or high-grade flow-limiting stenosis identified.   2D Echo with Bubble study -Left ventricle: The cavity size was normal. Wall thickness was  normal. Systolic function was normal. The estimated ejection fraction was in the range of 60% to 65%. Wall motion was normal; there were no regional wall motion abnormalities. Features are consistent with a pseudonormal left ventricular filling pattern, with concomitant abnormal relaxation and increased filling pressure (grade 2 diastolic dysfunction).   PHYSICAL EXAM Pleasant elderly Caucasian male not in distress. . Afebrile. Head is nontraumatic. Neck is supple without bruit.    Cardiac exam no murmur or gallop. Lungs are clear to auscultation. Distal pulses are well felt. Neurological Exam ;  Awake  Alert oriented x 3. Normal speech and language.eye movements full without nystagmus.fundi were not visualized. Vision acuity and fields appear normal. Hearing is normal. Palatal movements are normal. Face symmetric. Tongue midline. Normal strength, tone, reflexes and coordination. Mild action tremors both outstretched upper extremities which diminishes with rest and increases with intention. No cogwheel rigidity or bradykinesia. Normal sensation. Gait deferred. ASSESSMENT/PLAN [name] is a 82 y.o. male with history of hyperlipidemia, Schatzki's ring, essential tremor, sleep apnea and chronic kidney disease presenting with speech difficulty. He did not receive IV t-PA due to resolved deficits.   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 Single right M2 stenosis  MRA neck unremarkable  2D Echo with bubble study  No source of embolus  TEE to look for embolic source. Arranged with [place] for tomorrow.  If positive for PFO (patent foramen ovale), check bilateral lower extremity venous dopplers to rule out DVT as possible source of stroke. (I have made patient NPO after midnight tonight).  He has had  negative OP  tele monitoring (30 d) in the past  LDL 74  HgbA1c 5.7  Heparin 5000 units sq tid for VTE prophylaxis Diet regular Room service appropriate?: Yes; Fluid consistency:: Thin  aspirin 81 mg daily prior to admission, now on aspirin 325 mg daily  Patient counseled to be compliant with his antithrombotic medications  Ongoing aggressive stroke risk factor management  Therapy recommendations:  OP OT, HHPT (pt/wife would rather continue OP therapy)  Disposition:  Return home with wife (continue OP therapy)  Hypertension  Stable  Hyperlipidemia  Home meds:  No statin, on tricor 145 mg daily resumed in hospital  Allergic to lipitor (rash)  LDL 74, goal < 70  Add statin.Continue statin at discharge  Pre-Diabetes  HgbA1c 5.7, at goal < 7.0  Other Stroke Risk Factors  Advanced age  Family hx stroke (mother)  Coronary artery disease  Obstructive sleep apnea, "resolved" per pt, not on CPAP   Other Active Problems  CHF  Benign essential tremor  B12 deficiency  Sacral ulcer  CKD stage 3  Hx PE in 1993 from what was thought to be superficial thrombosis, treated with coumadin x 20 years due to this and strong family hx of phlebitis. Genetic workup thus far has been negative. Stopped due to fall risk.  Hospital day # [place] for Pager information 11/14/2015 9:31 AM  I have personally examined this patient, reviewed notes, independently viewed imaging studies, participated in medical decision making and plan of care. I have made any additions or clarifications directly to the above note. Agree with note above. He presented with transient speech difficulties likely related to embolic left parietal MCA branch infarct etiology to be determined. He remains at risk for neurological worsening, recurrent stroke, TIA and needs ongoing stroke evaluation. Recommend TEE  Antony Contras, MD Medical Director [facility] St[facility]  [facility] Pager: [phone] 11/14/2015 3:42 PM    To contact Stroke Continuity provider, please refer to [email or web address] After hours, contact General Neurology

## 2015-11-14 NOTE — Clinical Social Work Note (Signed)
CSW received referral for SNF.  Case discussed with case manager, and plan is to discharge home.  CSW to sign off please re-consult if social work needs arise.  Lizbeth Feijoo R. Janese Radabaugh, MSW, LCSWA [phone]  

## 2015-11-15 ENCOUNTER — Encounter (HOSPITAL_COMMUNITY)
Admission: EM | Disposition: A | Discharge status: Home / Self Care | Payer: Self-pay | Source: Home / Self Care | Attending: Internal Medicine

## 2015-11-15 ENCOUNTER — Inpatient Hospital Stay (HOSPITAL_COMMUNITY): Payer: Medicare Other

## 2015-11-15 ENCOUNTER — Encounter (HOSPITAL_COMMUNITY): Payer: Self-pay | Admitting: Internal Medicine

## 2015-11-15 DIAGNOSIS — R1310 Dysphagia, unspecified: Secondary | ICD-10-CM

## 2015-11-15 DIAGNOSIS — Q394 Esophageal web: Secondary | ICD-10-CM

## 2015-11-15 DIAGNOSIS — K222 Esophageal obstruction: Secondary | ICD-10-CM | POA: Diagnosis present

## 2015-11-15 HISTORY — PX: ESOPHAGOGASTRODUODENOSCOPY: SHX5428

## 2015-11-15 SURGERY — ECHOCARDIOGRAM, TRANSESOPHAGEAL
Anesthesia: Moderate Sedation

## 2015-11-15 SURGERY — EGD (ESOPHAGOGASTRODUODENOSCOPY)
Anesthesia: Moderate Sedation

## 2015-11-15 MED ORDER — FENTANYL CITRATE (PF) 100 MCG/2ML IJ SOLN
INTRAMUSCULAR | Status: AC
  Filled 2015-11-15: qty 2

## 2015-11-15 MED ORDER — SODIUM CHLORIDE 0.9 % IV SOLN: INTRAVENOUS | Status: DC

## 2015-11-15 MED ORDER — FENTANYL CITRATE (PF) 100 MCG/2ML IJ SOLN
INTRAMUSCULAR | Status: DC | PRN
  Administered 2015-11-15: 25 ug via INTRAVENOUS
  Administered 2015-11-15 (×2): 12.5 ug via INTRAVENOUS

## 2015-11-15 MED ORDER — DIPHENHYDRAMINE HCL 50 MG/ML IJ SOLN
INTRAMUSCULAR | Status: AC
  Filled 2015-11-15: qty 1

## 2015-11-15 MED ORDER — MIDAZOLAM HCL 10 MG/2ML IJ SOLN
INTRAMUSCULAR | Status: DC | PRN
  Administered 2015-11-15 (×4): 1 mg via INTRAVENOUS

## 2015-11-15 MED ORDER — MIDAZOLAM HCL 5 MG/ML IJ SOLN
INTRAMUSCULAR | Status: AC
  Filled 2015-11-15: qty 2

## 2015-11-15 MED ORDER — PHENOL 1.4 % MT LIQD: 1.0000 | OROMUCOSAL | Status: DC | PRN

## 2015-11-15 MED ORDER — BUTAMBEN-TETRACAINE-BENZOCAINE 2-2-14 % EX AERO
INHALATION_SPRAY | CUTANEOUS | Status: DC | PRN
  Administered 2015-11-15: 2 via TOPICAL

## 2015-11-15 NOTE — Care Management Important Message (Signed)
Important Message  Patient Details  Name: [name] MRN: [id number] Date of Birth: 11-16-1930   Medicare Important Message Given:  Yes    Merced Hanners [place] 11/15/2015, 11:45 AM

## 2015-11-15 NOTE — Progress Notes (Signed)
STROKE TEAM PROGRESS NOTE   HISTORY OF PRESENT ILLNESS [name] is an 82 y.o. male with a history of hyperlipidemia, Schatzki's ring, essential tremor, sleep apnea and chronic kidney disease, brought to the emergency room following new onset of speech output difficulty. Patient was last known well at 2 AM today 11/12/2015. When he woke up at 8 AM his wife noticed that his speech was unintelligible. Speech has improved since his arrived in the emergency room. There's no previous history of stroke nor TIA. Patient has been taking aspirin daily. CT scan of his head showed stable atrophy and chronic white matter changes, but no acute abnormality. No weakness or numbness involving face or extremities was experienced. Patient was not administered IV t-PA secondary to deficit resolved. He was admitted for further evaluation and treatment.   SUBJECTIVE (INTERVAL HISTORY) His wife is at the bedside.  Overall he feels his condition is completely resolved.  Plan to have esophageal dilatation today by GI and TEE later  OBJECTIVE Temp:  [97.8 F (36.6 C)-98.6 F (37 C)] 97.9 F (36.6 C) (03/09 0937) Pulse Rate:  [65-73] 70 (03/09 0937) Cardiac Rhythm:  [-] Normal sinus rhythm (03/09 0700) Resp:  [18-20] 18 (03/09 0937) BP: (111-138)/(51-58) 120/51 mmHg (03/09 0937) SpO2:  [96 %-100 %] 100 % (03/09 0937)  CBC:  Recent Labs Lab 11/12/15 0747  11/12/15 1201 11/13/15 0557  WBC 5.8  --  5.9 6.3  NEUTROABS 3.4  --   --   --   HGB 11.8*  < > 10.9* 10.2*  HCT 35.2*  < > 33.1* 30.3*  MCV 111.7*  --  111.4* 111.4*  PLT 127*  --  120* 110*  < > = values in this interval not displayed.  Basic Metabolic Panel:   Recent Labs Lab 11/12/15 0747 11/12/15 0750 11/12/15 1201 11/13/15 0557  NA 142 143  --  143  K 4.3 4.2  --  4.3  CL 107 104  --  109  CO2 25  --   --  25  GLUCOSE 119* 111*  --  122*  BUN 29* 32*  --  32*  CREATININE 1.52* 1.50* 1.57* 1.70*  CALCIUM 9.6  --   --  9.2     Lipid Panel:     Component Value Date/Time   CHOL 137 11/13/2015 0557   TRIG 248* 11/13/2015 0557   HDL 13* 11/13/2015 0557   CHOLHDL 10.5 11/13/2015 0557   VLDL 50* 11/13/2015 0557   LDLCALC 74 11/13/2015 0557   HgbA1c:  Lab Results  Component Value Date   HGBA1C 5.7* 11/13/2015   Urine Drug Screen:     Component Value Date/Time   LABOPIA NONE DETECTED 11/12/2015 1143   COCAINSCRNUR NONE DETECTED 11/12/2015 1143   LABBENZ NONE DETECTED 11/12/2015 1143   AMPHETMU NONE DETECTED 11/12/2015 1143   THCU NONE DETECTED 11/12/2015 1143   LABBARB NONE DETECTED 11/12/2015 1143      IMAGING  Dg Chest 2 View 11/12/2015  Cardiomegaly.  No acute cardiopulmonary process.   Ct Head Wo Contrast 11/12/2015  No acute intracranial abnormality. Stable atrophy and chronic white matter disease. No definite acute cortical infarction.   MRI HEAD  11/13/2015  1. Punctate 4 mm ischemic cortical left parietal lobe infarct. No associated hemorrhage. 2. No other acute intracranial process. 3. Generalized age-related cerebral atrophy with moderate chronic small vessel ischemic disease.   MRA HEAD  11/13/2015  1. No large or proximal arterial branch occlusion within the intracranial  circulation. 2. Single short-segment severe nonocclusive right M2 stenosis as above. 3. No other focal severe or correctable stenosis identified within the intracranial circulation. 4. Mild distal small vessel atheromatous irregularity within the MCA and PCA branches bilaterally   MRA NECK  11/13/2015  Negative MRA of the neck. No critical or high-grade flow-limiting stenosis identified.   2D Echo with Bubble study -Left ventricle: The cavity size was normal. Wall thickness was normal. Systolic function was normal. The estimated ejection fraction was in the range of 60% to 65%. Wall motion was normal; there were no regional wall motion abnormalities. Features are consistent with a pseudonormal left ventricular filling pattern,  with concomitant abnormal relaxation and increased filling pressure (grade 2 diastolic dysfunction).   PHYSICAL EXAM Pleasant elderly Caucasian male not in distress. . Afebrile. Head is nontraumatic. Neck is supple without bruit.    Cardiac exam no murmur or gallop. Lungs are clear to auscultation. Distal pulses are well felt. Neurological Exam ;  Awake  Alert oriented x 3. Normal speech and language.eye movements full without nystagmus.fundi were not visualized. Vision acuity and fields appear normal. Hearing is normal. Palatal movements are normal. Face symmetric. Tongue midline. Normal strength, tone, reflexes and coordination. Mild action tremors both outstretched upper extremities which diminishes with rest and increases with intention. No cogwheel rigidity or bradykinesia. Normal sensation. Gait deferred. ASSESSMENT/PLAN [name] is a 82 y.o. male with history of hyperlipidemia, Schatzki's ring, essential tremor, sleep apnea and chronic kidney disease presenting with speech difficulty. He did not receive IV t-PA due to resolved deficits.   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 Single right M2 stenosis  MRA neck unremarkable  2D Echo with bubble study  No source of embolus  TEE to look for embolic source. Arranged with Loraine for tomorrow.  If positive for PFO (patent foramen ovale), check bilateral lower extremity venous dopplers to rule out DVT as possible source of stroke. (I have made patient NPO after midnight tonight).  He has had negative OP  tele monitoring (30 d) in the past  LDL 74  HgbA1c 5.7  Heparin 5000 units sq tid for VTE prophylaxis Diet NPO time specified Except for: Sips with Meds  aspirin 81 mg daily prior to admission, now on aspirin 325 mg daily  Patient  counseled to be compliant with his antithrombotic medications  Ongoing aggressive stroke risk factor management  Therapy recommendations:  OP OT, HHPT (pt/wife would rather continue OP therapy)  Disposition:  Return home with wife (continue OP therapy)  Hypertension  Stable  Hyperlipidemia  Home meds:  No statin, on tricor 145 mg daily resumed in hospital  Allergic to lipitor (rash)  LDL 74, goal < 70  Add statin.Continue statin at discharge  Pre-Diabetes  HgbA1c 5.7, at goal < 7.0  Other Stroke Risk Factors  Advanced age  Family hx stroke (mother)  Coronary artery disease  Obstructive sleep apnea, "resolved" per pt, not on CPAP   Other Active Problems  CHF  Benign essential tremor  B12 deficiency  Sacral ulcer  CKD stage 3  Hx PE in 1993 from what was thought to be superficial thrombosis, treated with coumadin x 20 years due to this and strong family hx of phlebitis. Genetic workup thus far has been negative. Stopped due to fall risk.  Hospital day # [place] for Pager information 11/15/2015 12:38 PM  I have personally examined this patient, reviewed notes, independently viewed imaging studies, participated in medical decision making and plan of care. I have made any additions or clarifications directly to the above note. Agree with note above. He presented with transient speech difficulties likely related to embolic left parietal MCA branch infarct etiology to be determined.Plan  esophageal dilatation and TEE today .  Antony Contras, MD Medical Director [facility] Pager: [phone] 11/15/2015 12:38 PM    To contact Stroke Continuity provider, please refer to [email or web address] After hours, contact General Neurology

## 2015-11-15 NOTE — Op Note (Addendum)
[facility] Patient Name: [name] Procedure Date : 11/15/2015 MRN: [id number] Attending MD: Gatha Mayer , MD Date of Birth: 02-07-1931 CSN: [id number] Age: 82 Admit Type: Inpatient Procedure:                Upper GI endoscopy Indications:              Dysphagia Providers:                Gatha Mayer, MD, Tory Emerald, RN, Despina Pole, Technician Referring MD:              Medicines:                Fentanyl 12.5 micrograms IV, Midazolam 2 mg IV Complications:            No immediate complications. Estimated Blood Loss:     Estimated blood loss was minimal. Procedure:                Pre-Anesthesia Assessment:                           - Prior to the procedure, a History and Physical                            was performed, and patient medications and                            allergies were reviewed. The patient's tolerance of                            previous anesthesia was also reviewed. The risks                            and benefits of the procedure and the sedation                            options and risks were discussed with the patient.                            All questions were answered, and informed consent                            was obtained. Prior Anticoagulants: The patient has                            taken no previous anticoagulant or antiplatelet                            agents. ASA Grade Assessment: III - A patient with                            severe systemic disease. After reviewing the risks  and benefits, the patient was deemed in                            satisfactory condition to undergo the procedure.                           After obtaining informed consent, the endoscope was                            passed under direct vision. Throughout the                            procedure, the patient's blood pressure, pulse, and                            oxygen  saturations were monitored continuously. The                            [id number] ID:[id number]) scope was introduced through the                            mouth, and advanced to the second part of duodenum.                            The upper GI endoscopy was accomplished without                            difficulty. The patient tolerated the procedure                            well. Scope In: Scope Out: Findings:      One moderate benign-appearing, intrinsic stenosis was found 20 to 22 cm       from the incisors. And was traversed. The scope was withdrawn. Dilation       was performed with a Maloney dilator with moderate resistance at 54 Fr.       The dilation site was examined following endoscope reinsertion and       showed moderate improvement in luminal narrowing. Estimated blood loss       was minimal.      The exam was otherwise without abnormality. Impression:               - Benign-appearing esophageal stenosis. cervical                            esophagus was tight. no distal stricture Dilated.                            Dilation effect seen. I assisted with attempted TEE                            after this - could not get that probe to pass.                           - The examination was  otherwise normal.                           - No specimens collected. Moderate Sedation:      Moderate (conscious) sedation was administered by the endoscopy nurse       and supervised by the endoscopist. The following parameters were       monitored: oxygen saturation, heart rate, blood pressure, respiratory       rate, EKG, adequacy of pulmonary ventilation, and response to care.       Total physician intraservice time was 10 minutes. Recommendation:           - Return patient to hospital ward for ongoing care.                           - Continue present medications.                           - Clear liquid diet for 1 hour.                           - Soft diet for 1 day.                            - consider reattmpt TEE in a few weeks - could need                            Ba swallow before to sort out Procedure Code(s):        --- Professional ---                           [phone], Esophagogastroduodenoscopy, flexible,                            transoral; diagnostic, including collection of                            specimen(s) by brushing or washing, when performed                            (separate procedure)                           43450, Dilation of esophagus, by unguided sound or                            bougie, single or multiple passes                           G0500, Moderate sedation services provided by the                            same physician or other qualified health care                            professional performing a gastrointestinal  endoscopic service that sedation supports,                            requiring the presence of an independent trained                            observer to assist in the monitoring of the                            patient's level of consciousness and physiological                            status; initial 15 minutes of intra-service time;                            patient age 66 years or older (additional time may                            be reported with [phone], as appropriate) Diagnosis Code(s):        --- Professional ---                           K22.2, Esophageal obstruction                           R13.10, Dysphagia, unspecified CPT copyright 2016 [employer]. All rights reserved. The codes documented in this report are preliminary and upon coder review may  be revised to meet current compliance requirements. Gatha Mayer, MD Gatha Mayer, MD 11/15/2015 2:21:50 PM Number of Addenda: 1 Addendum Number: 1   Addendum Date: 11/15/2015 2:25:56 PM      2016 CT c-spine indicates bulky osteophytes C2-C7 - I suspect these are       contributing to his  dysphagia. Hopefully dilation will help some. Gatha Mayer, MD Gatha Mayer, MD 11/15/2015 2:26:40 PM

## 2015-11-15 NOTE — Progress Notes (Signed)
Patient returned back to room from ENDO alert at this time. Family present at bedside. VSS. Denied pain. No other distress noted. Will continue to monitor.  Ave Filter, RN

## 2015-11-15 NOTE — Progress Notes (Signed)
[facility] PROGRESS NOTE  [name] [id number] DOB: 1930-09-19 DOA: 11/12/2015 PCP: Ria Bush, MD Brief narrative 82 year old male with history of coronary artery disease, see Katy stage III, history of PE (was on anticoagulation for almost 20 years, stopped few years back), degenerative disc disease, since her tremor, schatzki's ring with dysphagia ( s/p dilatation by Dr [name], GERD who was admitted with acute onset of speech impairment. Symptoms improved by the time he came to the ED. Head CT was negative for acute abnormality. MRI of the brain showed a small 4 mm acute ischemic left parietal infarct. MRA showed left M2 short segment severe stenosis.  Assessment/Plan: Acute ischemic left parietal infarct Etiology includes atherosclerotic versus hypercoagulable state. On full dose aspirin. Speech impairment has resolved. No focal neurological weakness. Lipid panel shows elevated triglycerides, LDL of 74. A1c of 5.7. Continue fibrate. Allow permissive hypertension. Discussed with neurology. He has  history of PE in 1992 and treated for almost 20 years with Coumadin, which was stopped due to fall risk.  Hypercoagulable workup was negative when checked 1 back. Factor VIII assay sent. 2-D echo with bubble study was unremarkable.  Doppler lower extremity negative for DVT. -Cardiology consulted for TEE. Scheduled for tomorrow. If TEE positive for thrombus the patient will need anticoagulation, if negative he will be discharged on Plavix. Discussed with neurology. Factor VIII assay is positive then he will again need anticoagulation and if negative then  plan on outpatient loop recorder.  GERD with history of schatzki's ring Follows with Dr. [name] as outpatient. Seen by GI and scheduled for EGD with esophageal dilatation today.  History of CHF Euvolemic. EF of  a 6-70% as per recent echo. Shows grade 1 diastolic dysfunction.  CKD stage III Renal function at baseline.  Continue to monitor.  diet: Nothing by mouth for EGD. Would place him on clear liquid after returning and then again keep nothing by mouth for TEE tomorrow.  DVT prophylaxis: Subcutaneous heparin  Code Status: full code Family Communication: wife at bedside Disposition Plan: [facility] [facility] possibly after TEE tomorrow.   Consultants:  Neurology   [facility]  Procedures:  MRI brain   CT head   2d echo   carotid dopplers  Antibiotics:  none  HPI/Subjective: Seen and examined . No Overnight issues.  Objective: Filed Vitals:   11/15/15 0507 11/15/15 0937  BP:  120/51  Pulse: 65 70  Temp: 97.8 F (36.6 C) 97.9 F (36.6 C)  Resp: 20 18    Intake/Output Summary (Last 24 hours) at 11/15/15 1344 Last data filed at 11/15/15 1000  Gross per 24 hour  Intake   1950 ml  Output      0 ml  Net   1950 ml   Filed Weights   11/12/15 0727  Weight: 78.472 kg (173 lb)    Exam:   General:  not in distress  HEENT:  moist mucosa  Chest: Clear bilaterally  CVS: Normal S1 and S2, no murmurs  GI: Nondistended, nontender, bowel sounds present  Musculoskeletal: Warm, no edema  CNS: Alert and oriented, nonfocal, fine resting tremors    Data Reviewed: Basic Metabolic Panel:  Recent Labs Lab 11/12/15 0747 11/12/15 0750 11/12/15 1201 11/13/15 0557  NA 142 143  --  143  K 4.3 4.2  --  4.3  CL 107 104  --  109  CO2 25  --   --  25  GLUCOSE 119* 111*  --  122*  BUN 29* 32*  --  32*  CREATININE 1.52* 1.50* 1.57* 1.70*  CALCIUM 9.6  --   --  9.2   Liver Function Tests:  Recent Labs Lab 11/12/15 0747  AST 30  ALT 24  ALKPHOS 52  BILITOT 1.2  PROT 6.2*  ALBUMIN 3.5   No results for input(s): LIPASE, AMYLASE in the last 168 hours. No results for input(s): AMMONIA in the last 168 hours. CBC:  Recent Labs Lab 11/12/15 0747 11/12/15 0750 11/12/15 1201 11/13/15 0557  WBC 5.8  --  5.9 6.3  NEUTROABS 3.4  --   --   --   HGB 11.8* 12.2* 10.9* 10.2*  HCT  35.2* 36.0* 33.1* 30.3*  MCV 111.7*  --  111.4* 111.4*  PLT 127*  --  120* 110*   Cardiac Enzymes: No results for input(s): CKTOTAL, CKMB, CKMBINDEX, TROPONINI in the last 168 hours. BNP (last 3 results) No results for input(s): BNP in the last 8760 hours.  ProBNP (last 3 results) No results for input(s): PROBNP in the last 8760 hours.  CBG: No results for input(s): GLUCAP in the last 168 hours.  No results found for this or any previous visit (from the past 240 hour(s)).   Studies: No results found.  Scheduled Meds: . [facility] Hold] antiseptic oral rinse  7 mL Mouth Rinse q12n4p  . [MAR Hold] aspirin  300 mg Rectal Daily   Or  . [MAR Hold] aspirin  325 mg Oral Daily  . [MAR Hold] chlorhexidine  15 mL Mouth Rinse BID  . [MAR Hold] fenofibrate  54 mg Oral q morning - 10a  . [MAR Hold] gabapentin  200 mg Oral TID  . [MAR Hold] heparin  5,000 Units Subcutaneous 3 times per day  . [MAR Hold] oxybutynin  5 mg Oral BID  . [MAR Hold] pantoprazole  40 mg Oral Daily  . [MAR Hold] venlafaxine  75 mg Oral Daily   Continuous Infusions: . sodium chloride 50 mL/hr at 11/14/15 1705  . sodium chloride        Time spent: [place]  [facility] Pager [phone] If 7PM-7AM, please contact night-coverage at [email or web address], password [facility] 11/15/2015, 1:44 PM  LOS: 3 days

## 2015-11-16 DIAGNOSIS — R791 Abnormal coagulation profile: Secondary | ICD-10-CM

## 2015-11-16 DIAGNOSIS — Z8673 Personal history of transient ischemic attack (TIA), and cerebral infarction without residual deficits: Secondary | ICD-10-CM | POA: Diagnosis present

## 2015-11-16 DIAGNOSIS — D696 Thrombocytopenia, unspecified: Secondary | ICD-10-CM

## 2015-11-16 LAB — CBC
HCT: 30.4 % — ABNORMAL LOW (ref 39.0–52.0)
Hemoglobin: 10 g/dL — ABNORMAL LOW (ref 13.0–17.0)
MCH: 36.4 pg — AB (ref 26.0–34.0)
MCHC: 32.9 g/dL (ref 30.0–36.0)
MCV: 110.5 fL — ABNORMAL HIGH (ref 78.0–100.0)
PLATELETS: 109 10*3/uL — AB (ref 150–400)
RBC: 2.75 MIL/uL — ABNORMAL LOW (ref 4.22–5.81)
RDW: 14.1 % (ref 11.5–15.5)
WBC: 5.5 10*3/uL (ref 4.0–10.5)

## 2015-11-16 LAB — SAVE SMEAR

## 2015-11-16 LAB — FACTOR 8 ASSAY: Coagulation Factor VIII: 310 % — ABNORMAL HIGH (ref 57–163)

## 2015-11-16 MED ORDER — APIXABAN 2.5 MG PO TABS: 2.5000 mg | ORAL_TABLET | Freq: Two times a day (BID) | ORAL | Status: DC

## 2015-11-16 MED ORDER — APIXABAN 2.5 MG PO TABS
2.5000 mg | ORAL_TABLET | Freq: Two times a day (BID) | ORAL | Status: DC
  Administered 2015-11-16: 2.5 mg via ORAL
  Filled 2015-11-16: qty 1

## 2015-11-16 MED ORDER — SIMVASTATIN 40 MG PO TABS: 40.0000 mg | ORAL_TABLET | Freq: Every day | ORAL | Status: DC

## 2015-11-16 MED ORDER — WARFARIN SODIUM 2.5 MG PO TABS: 2.5000 mg | ORAL_TABLET | Freq: Every day | ORAL | Status: DC

## 2015-11-16 NOTE — Progress Notes (Signed)
     [facility] Progress Note  Subjective:  Feels good.  Tolerated soft diet this AM.  Going home today.  Was started on Eliquis due to high Factor VIII.  Objective:  Vital signs in last 24 hours: Temp:  [97.2 F (36.2 C)-98.2 F (36.8 C)] 97.6 F (36.4 C) (03/10 0154) Pulse Rate:  [63-86] 63 (03/10 0154) Resp:  [10-19] 18 (03/10 0154) BP: (95-161)/(37-90) 95/37 mmHg (03/10 0154) SpO2:  [96 %-100 %] 96 % (03/10 0154) Last BM Date: 11/16/15 General:  Alert, Well-developed, in NAD Heart:  Regular rate and rhythm; no murmurs Pulm:  CTAB.  No W/R/R. Abdomen:  Soft, non-distended.  BS present.  Non-tender. Extremities:  Without edema. Neurologic:  Alert and oriented x 4;  grossly normal neurologically. Psych:  Alert and cooperative. Normal mood and affect.  Intake/Output from previous day: 03/09 0701 - 03/10 0700 In: 2723.3 [P.O.:600; I.V.:2123.3] Out: -   Lab Results:  Recent Labs  11/16/15 0646  WBC 5.5  HGB 10.0*  HCT 30.4*  PLT 109*   Assessment / Plan: -Dysphagia:  S/p EGD with dilation 3/9 for esophageal stenosis, but no stricture or ring seen.  TEE attempted at the same time but unable to be completed.   -CVA: Found to secondary to elevated Factor VIII.  Started on Eliquis.  May not even need repeat TEE now that source was identified.  *Continue soft diet and advance to other foods as tolerated.   *For discharge today.   LOS: 4 days   Francess Mullen D.  11/16/2015, 9:28 AM  Pager number [phone]

## 2015-11-16 NOTE — Discharge Instructions (Addendum)
Stroke Prevention Some medical conditions and behaviors are associated with an increased chance of having a stroke. You may prevent a stroke by making healthy choices and managing medical conditions. HOW CAN I REDUCE MY RISK OF HAVING A STROKE?   Stay physically active. Get at least 30 minutes of activity on most or all days.  Do not smoke. It may also be helpful to avoid exposure to secondhand smoke.  Limit alcohol use. Moderate alcohol use is considered to be:  No more than 2 drinks per day for men.  No more than 1 drink per day for nonpregnant women.  Eat healthy foods. This involves:  Eating 5 or more servings of fruits and vegetables a day.  Making dietary changes that address high blood pressure (hypertension), high cholesterol, diabetes, or obesity.  Manage your cholesterol levels.  Making food choices that are high in fiber and low in saturated fat, trans fat, and cholesterol may control cholesterol levels.  Take any prescribed medicines to control cholesterol as directed by your health care provider.  Manage your diabetes.  Controlling your carbohydrate and sugar intake is recommended to manage diabetes.  Take any prescribed medicines to control diabetes as directed by your health care provider.  Control your hypertension.  Making food choices that are low in salt (sodium), saturated fat, trans fat, and cholesterol is recommended to manage hypertension.  Ask your health care provider if you need treatment to lower your blood pressure. Take any prescribed medicines to control hypertension as directed by your health care provider.  If you are 18-39 years of age, have your blood pressure checked every 3-5 years. If you are 40 years of age or older, have your blood pressure checked every year.  Maintain a healthy weight.  Reducing calorie intake and making food choices that are low in sodium, saturated fat, trans fat, and cholesterol are recommended to manage  weight.  Stop drug abuse.  Avoid taking birth control pills.  Talk to your health care provider about the risks of taking birth control pills if you are over 35 years old, smoke, get migraines, or have ever had a blood clot.  Get evaluated for sleep disorders (sleep apnea).  Talk to your health care provider about getting a sleep evaluation if you snore a lot or have excessive sleepiness.  Take medicines only as directed by your health care provider.  For some people, aspirin or blood thinners (anticoagulants) are helpful in reducing the risk of forming abnormal blood clots that can lead to stroke. If you have the irregular heart rhythm of atrial fibrillation, you should be on a blood thinner unless there is a good reason you cannot take them.  Understand all your medicine instructions.  Make sure that other conditions (such as anemia or atherosclerosis) are addressed. SEEK IMMEDIATE MEDICAL CARE IF:   You have sudden weakness or numbness of the face, arm, or leg, especially on one side of the body.  Your face or eyelid droops to one side.  You have sudden confusion.  You have trouble speaking (aphasia) or understanding.  You have sudden trouble seeing in one or both eyes.  You have sudden trouble walking.  You have dizziness.  You have a loss of balance or coordination.  You have a sudden, severe headache with no known cause.  You have new chest pain or an irregular heartbeat. Any of these symptoms may represent a serious problem that is an emergency. Do not wait to see if the symptoms will   go away. Get medical help at once. Call your local emergency services (911 in [place] Do not drive yourself to the hospital.   This information is not intended to replace advice given to you by your health care provider. Make sure you discuss any questions you have with your health care provider.   Document Released: 10/02/2004 Document Revised: 09/15/2014 Document Reviewed:  02/25/2013 [employer] Interactive Patient Education 2016 [employer]  

## 2015-11-16 NOTE — Discharge Summary (Addendum)
Physician Discharge Summary  [name] [id number] DOB: 02-24-1931 DOA: 11/12/2015  PCP: Ria Bush, MD  Admit date: 11/12/2015 Discharge date: 11/16/2015  Time spent: 35 minutes  Recommendations for Outpatient Follow-up:  1. Discharge home with outpatient physical therapy. 2. Patient will be discharged on Coumadin and simvastatin. Please monitor INR as outpatient. He needs to follow-up with Dr. Marin Olp as outpatient. Please repeat factor 8 assay in 6-8 weeks.  3. Please monitor platelets during outpatient follow-up. 4. Follow  up with Dr. Leonie Man  in 2 months.   Discharge Diagnoses:  Principal Problem:   Acute ischemic stroke [facility])   Active Problems:   Thrombocytopenia (HCC)   Aphasia   Stroke (HCC)   CHF (congestive heart failure) (HCC)   Hypotension   Benign essential tremor   B12 deficiency   Sacral ulcer   Schatzki's ring   Esophageal stenosis   Discharge Condition: fair  Diet recommendation: Healthy  CODE STATUS: Full code   Filed Weights   11/12/15 0727  Weight: 78.472 kg (173 lb)    History of present illness:  Please refer to admission H&P for details, in brief,82 year old male with history of coronary artery disease, see [place] stage III, history of PE (was on anticoagulation for almost 20 years, stopped few years back), degenerative disc disease, since her tremor, schatzki's ring with dysphagia ( s/p dilatation by Dr [name], GERD who was admitted with acute onset of speech impairment. Symptoms improved by the time he came to the ED. Head CT was negative for acute abnormality. MRI of the brain showed a small 4 mm acute ischemic left parietal infarct. MRA showed left M2 short segment severe stenosis.  Hospital Course:  Acute ischemic left parietal infarct Etiology includes atherosclerotic versus hypercoagulable state. Placed On full dose aspirin. Speech impairment has resolved. No focal neurological weakness. Lipid panel shows elevated  triglycerides, LDL of 74. A1c of 5.7. Continue fibrate. Has low normal blood pressure. Added Zocor 40 mg daily (allergies to Lipitor) Discussed with neurology. He has history of PE in 1992 and treated for almost 20 years with Coumadin, which was stopped due to ?fall risk. Hypercoagulable workup was negative when checked 1 year back. Factor VIII assay sent and is high (310). 2-D echo with bubble study was unremarkable. Doppler lower extremity negative for DVT. -Cardiology consulted for TEE. This was attempted after EGD done for esophageal dilatation but could not pass the probe. -Discussed with neurology. Planned to discharge patient on Eliquis but he reported that he would not be able to afford it. Patient has tolerated Coumadin in the past. We will discharge patient on Coumadin 2.5 mg daily. Discontinue aspirin. -I will have him follow-up with Hematologist Dr Marin Olp to determine if patient is truly hypercoagulable (elevated factor VIII) and decide on long-term use of anticoagulation. I have discussed this was Dr. Marin Olp who will see patient in the office. He will need a repeat factor VIII checked in 6-weeks.   GERD with history of schatzki's ring Follows with Dr. [name] as outpatient. Seen by GI and patient underwent EGD with dilatation of the distal esophagus. Noted for tight cervical esophagus. Attempted TEE after esophageal dilatation but could not pass the probe so was aborted.. Continue PPI.  History of CHF Euvolemic. EF of a 65-70% as per recent echo. Shows grade 1 diastolic dysfunction.  CKD stage III Renal function at baseline. Continue to monitor.  Thrombocytopenia Noted for recent drop in platelets for the past 3-4 months. I have sent for a peripheral  smear. He will follow-up with Dr. Marin Olp.    Patient stable to be discharged home.   Family Communication: wife at bedside Disposition Plan: Home with outpatient PT. Patient is enrolled in outpatient PT twice a week and he  and his wife would like to continue that and do not want home health PT   Consultants:  Neurology  [facility]  Procedures:  MRI brain  CT head  2d echo  carotid dopplers  EGD with dilatation  Antibiotics:  none  Discharge Exam: Filed Vitals:   11/15/15 2156 11/16/15 0154  BP: 122/55 95/37  Pulse: 65 63  Temp: 98.1 F (36.7 C) 97.6 F (36.4 C)  Resp: 18 18     General:Elderly male not in distress  HEENT: moist mucosa  Chest: Clear bilaterally  CVS: Normal S1 and S2, no murmurs  GI: Nondistended, nontender, bowel sounds present  Musculoskeletal: Warm, no edema  CNS: Alert and oriented, nonfocal, fine resting tremors  Discharge Instructions   Discharge Instructions    Ambulatory referral to Neurology    Complete by:  As directed   Please schedule post stroke follow up in 2 months.          Current Discharge Medication List    START taking these medications   Details  Warfarin ( coumadin) 2.5 MG  tablet Take 1 tablet (2.5 mg total) by mouth daily (at 6pm) Qty:30 tablet, Refills: 0    simvastatin (ZOCOR) 40 MG tablet Take 1 tablet (40 mg total) by mouth daily. Qty: 30 tablet, Refills: 0      CONTINUE these medications which have NOT CHANGED   Details       Biotin 2500 MCG CAPS Take 1 capsule by mouth daily.    Cholecalciferol (VITAMIN D) 2000 UNITS CAPS Take 1 capsule by mouth daily.    clotrimazole (LOTRIMIN) 1 % cream Apply 1 application topically 2 (two) times daily. Qty: 30 g, Refills: 0   Associated Diagnoses: Rash and nonspecific skin eruption    Coenzyme Q10 (COQ-10) 100 MG CAPS Take 1 capsule by mouth daily.     Cyanocobalamin (B-12 PO) Take 10,000 mcg by mouth every Monday, Wednesday, and Friday.     dexlansoprazole (DEXILANT) 60 MG capsule Take 1 capsule (60 mg total) by mouth daily. Qty: 30 capsule, Refills: 0    docusate sodium (COLACE) 100 MG capsule Take 100 mg by mouth at bedtime.     fenofibrate (TRICOR) 145 MG  tablet TAKE 1 TABLET DAILY Qty: 90 tablet, Refills: 2    fexofenadine (ALLEGRA) 180 MG tablet Take 180 mg by mouth daily.     gabapentin (NEURONTIN) 100 MG capsule Take 2 capsules (200 mg total) by mouth 3 (three) times daily. Qty: 540 capsule, Refills: 1    Multiple Vitamins-Minerals (MULTIVITAMIN PO) Take by mouth daily.    oxybutynin (DITROPAN) 5 MG tablet Take 5 mg by mouth 2 (two) times daily.     venlafaxine (EFFEXOR) 75 MG tablet Take 75 mg by mouth daily.    albuterol (PROVENTIL HFA;VENTOLIN HFA) 108 (90 BASE) MCG/ACT inhaler Inhale 2 puffs into the lungs every 6 (six) hours as needed for wheezing or shortness of breath.      STOP taking these medications     doxycycline (VIBRA-TABS) 100 MG tablet      guaiFENesin (MUCINEX) 600 MG 12 hr tablet   Aspirin 81 mg tablet        Allergies  Allergen Reactions  . Lactose Intolerance (Gi)   .  Nizatidine     REACTION: Rash (Axid)  . Sulfadiazine     REACTION: Hives  . Aricept [Donepezil Hcl] Rash  . Lipitor [Atorvastatin] Rash  . Penicillins Rash    Has patient had a PCN reaction causing immediate rash, facial/tongue/throat swelling, SOB or lightheadedness with hypotension: No Has patient had a PCN reaction causing severe rash involving mucus membranes or skin necrosis: No Has patient had a PCN reaction that required hospitalization No Has patient had a PCN reaction occurring within the last 10 years: No If all of the above answers are "NO",  then may proceed with Cephalosporin use.   Follow-up Information    Follow up with SETHI,PRAMOD, MD In 2 months.   Specialties:  Neurology, Radiology   Why:  Stroke Clinic, Office will call you with appointment date & time   Contact information:   [place] [place] [address] [phone]       Follow up with Volanda Napoleon, MD. Schedule an appointment as soon as possible for a visit in 3 weeks.   Specialty:  Oncology   Contact information:   [address], [address] [place] [address] [phone]       Follow up with Ria Bush, MD. Schedule an appointment as soon as possible for a visit in 1 week.   Specialty:  Family Medicine   Contact information:   [place] [address] [phone]        The results of significant diagnostics from this hospitalization (including imaging, microbiology, ancillary and laboratory) are listed below for reference.    Significant Diagnostic Studies: Dg Chest 2 View  11/12/2015  CLINICAL DATA:  Patient with stroke.  Weakness. EXAM: CHEST  2 VIEW COMPARISON:  Chest radiograph 03/29/2015. FINDINGS: Stable enlarged cardiac and mediastinal contours. Unchanged calcified right hilar lymph nodes. No consolidation. No pleural effusion or pneumothorax. Thoracic spine degenerative changes and associated ankylosis. IMPRESSION: Cardiomegaly.  No acute cardiopulmonary process. Electronically Signed   By: Lovey Newcomer M.D.   On: 11/12/2015 12:26   Ct Head Wo Contrast  11/12/2015  CLINICAL DATA:  Aphasia, confusion EXAM: CT HEAD WITHOUT CONTRAST TECHNIQUE: Contiguous axial images were obtained from the base of the skull through the vertex without intravenous contrast. COMPARISON:  11/06/2014 FINDINGS: No skull fracture is noted. Paranasal sinuses and mastoid air cells are unremarkable. No intracranial hemorrhage, mass effect or midline shift. Stable atrophy and chronic white matter disease. No definite acute cortical infarction. No mass lesion is noted on this unenhanced scan. Ventricular size is stable from prior exam. IMPRESSION: No acute intracranial abnormality. Stable atrophy and chronic white matter disease. No definite acute cortical infarction. Electronically Signed   By: Lahoma Crocker M.D.   On: 11/12/2015 08:37   Mr Angiogram Neck W Wo Contrast  11/13/2015  CLINICAL DATA:  Initial evaluation for new onset expressive aphasia. EXAM: MR HEAD WITHOUT CONTRAST MR CIRCLE OF WILLIS WITHOUT  CONTRAST MRA OF THE NECK WITHOUT AND WITH CONTRAST TECHNIQUE: Multiplanar, multiecho pulse sequences of the brain and surrounding structures were obtained according to standard protocol without intravenous contrast.; Multiplanar and multiecho pulse sequences of the neck were obtained without and with intravenous contrast. Angiographic images of the neck were obtained using MRA technique without and with intravenous contrast.; Angiographic images of the Circle of Willis were obtained using MRA technique without intravenous contrast. CONTRAST:  63mL MULTIHANCE GADOBENATE DIMEGLUMINE 529 MG/ML IV SOLN COMPARISON:  Prior CT from re-/ 6/17. FINDINGS:  MR HEAD FINDINGS Diffusion-weighted imaging demonstrates a punctate 4 mm focus of restricted diffusion within the cortical gray matter of the left parietal lobe, consistent with a tiny acute ischemic infarct (series 5, image 27). No associated hemorrhage. No other infarct. Gray-white matter differentiation otherwise maintained. Major intracranial vascular flow voids are preserved. Diffuse prominence of the CSF containing spaces is compatible with generalized age-related cerebral atrophy. Patchy and confluent T2/FLAIR hyperintensity within the periventricular and deep white matter both cerebral hemispheres most consistent with chronic small vessel ischemic disease, moderate nature. Small vessel type changes present within the pons. No remote infarction identified. No acute or chronic intracranial hemorrhage. No mass lesion, midline shift, or mass effect. Ventricular prominence related to global parenchymal volume loss present without hydrocephalus. No extra-axial fluid collection. Craniocervical junction within normal limits. Mild degenerative spondylolysis within the visualized upper cervical spine. Pituitary gland normal. No acute abnormality about the orbits. Sequela prior bilateral lens extraction noted. Mild mucosal thickening within the max O sinuses and ethmoidal air  cells. Bilateral concha bullosa noted. No nasal septal deviation. Middle air cavities grossly clear. Inner ear structures normal. Bone marrow signal intensity within normal limits. No scalp soft tissue abnormality. MR CIRCLE OF WILLIS FINDINGS ANTERIOR CIRCULATION: Visualized distal cervical segments of the internal carotid arteries are patent with antegrade flow. Petrous, cavernous, and supraclinoid segments widely patent. A1 segments, anterior communicating artery, and anterior cerebral arteries well opacified. M1 segments widely patent without stenosis or occlusion. MCA bifurcations within normal limits. There is an apparent focal high-grade stenosis within a proximal right M2 branch (series 601, image 12). This is nonocclusive of flow seen distally. Attenuation within the more superior right M2 branch favored to be related to motion. No other focal high-grade stenosis. MCA branches demonstrate mild distal small vessel branch irregularity bilaterally. MCA branches are fairly symmetric with the regional hypoperfusion. POSTERIOR CIRCULATION: Vertebral arteries patent to the vertebrobasilar junction. Posterior inferior cerebellar arteries patent bilaterally. Basilar artery widely patent to its distal aspect. Superior cerebellar arteries patent bilaterally. Both posterior cerebral arteries arise from the basilar artery and are well opacified to their distal aspects. Mild distal small vessel irregularity within the PCAs bilaterally. No aneurysm or vascular malformation. MRA NECK FINDINGS Visualized aortic arch is of normal caliber with normal branch pattern. No high-grade stenosis at the origin of the great vessels. Visualized subclavian arteries widely patent. Right common carotid artery patent from its origin to the bifurcation. Probable minimal a centric plaque about the bifurcation without stenosis. Right ICA widely patent from the bifurcation to the skullbase without high-grade stenosis or occlusion. Left common  carotid artery patent from its origin to the bifurcation. No significant atheromatous disease about the bifurcation appreciated. Left ICA widely patent from the bifurcation to the skullbase. No flow-limiting stenosis or occlusion within the left carotid artery system. The left vertebral artery appears to arise separately from the aortic arch. Right vertebral artery arises from the subclavian artery. Vertebral arteries are widely patent along their entire course without stenosis or vascular occlusion. IMPRESSION: MRI HEAD IMPRESSION: 1. Punctate 4 mm ischemic cortical left parietal lobe infarct. No associated hemorrhage. 2. No other acute intracranial process. 3. Generalized age-related cerebral atrophy with moderate chronic small vessel ischemic disease. MRA HEAD IMPRESSION: 1. No large or proximal arterial branch occlusion within the intracranial circulation. 2. Single short-segment severe nonocclusive right M2 stenosis as above. 3. No other focal severe or correctable stenosis identified within the intracranial circulation. 4. Mild distal small vessel atheromatous irregularity within the MCA  and PCA branches bilaterally MRA NECK IMPRESSION: Negative MRA of the neck. No critical or high-grade flow-limiting stenosis identified. Electronically Signed   By: Jeannine Boga M.D.   On: 11/13/2015 03:06   Mr Brain Wo Contrast  11/13/2015  CLINICAL DATA:  Initial evaluation for new onset expressive aphasia. EXAM: MR HEAD WITHOUT CONTRAST MR CIRCLE OF WILLIS WITHOUT CONTRAST MRA OF THE NECK WITHOUT AND WITH CONTRAST TECHNIQUE: Multiplanar, multiecho pulse sequences of the brain and surrounding structures were obtained according to standard protocol without intravenous contrast.; Multiplanar and multiecho pulse sequences of the neck were obtained without and with intravenous contrast. Angiographic images of the neck were obtained using MRA technique without and with intravenous contrast.; Angiographic images of the  Circle of Willis were obtained using MRA technique without intravenous contrast. CONTRAST:  30mL MULTIHANCE GADOBENATE DIMEGLUMINE 529 MG/ML IV SOLN COMPARISON:  Prior CT from re-/ 6/17. FINDINGS: MR HEAD FINDINGS Diffusion-weighted imaging demonstrates a punctate 4 mm focus of restricted diffusion within the cortical gray matter of the left parietal lobe, consistent with a tiny acute ischemic infarct (series 5, image 27). No associated hemorrhage. No other infarct. Gray-white matter differentiation otherwise maintained. Major intracranial vascular flow voids are preserved. Diffuse prominence of the CSF containing spaces is compatible with generalized age-related cerebral atrophy. Patchy and confluent T2/FLAIR hyperintensity within the periventricular and deep white matter both cerebral hemispheres most consistent with chronic small vessel ischemic disease, moderate nature. Small vessel type changes present within the pons. No remote infarction identified. No acute or chronic intracranial hemorrhage. No mass lesion, midline shift, or mass effect. Ventricular prominence related to global parenchymal volume loss present without hydrocephalus. No extra-axial fluid collection. Craniocervical junction within normal limits. Mild degenerative spondylolysis within the visualized upper cervical spine. Pituitary gland normal. No acute abnormality about the orbits. Sequela prior bilateral lens extraction noted. Mild mucosal thickening within the max O sinuses and ethmoidal air cells. Bilateral concha bullosa noted. No nasal septal deviation. Middle air cavities grossly clear. Inner ear structures normal. Bone marrow signal intensity within normal limits. No scalp soft tissue abnormality. MR CIRCLE OF WILLIS FINDINGS ANTERIOR CIRCULATION: Visualized distal cervical segments of the internal carotid arteries are patent with antegrade flow. Petrous, cavernous, and supraclinoid segments widely patent. A1 segments, anterior  communicating artery, and anterior cerebral arteries well opacified. M1 segments widely patent without stenosis or occlusion. MCA bifurcations within normal limits. There is an apparent focal high-grade stenosis within a proximal right M2 branch (series 601, image 12). This is nonocclusive of flow seen distally. Attenuation within the more superior right M2 branch favored to be related to motion. No other focal high-grade stenosis. MCA branches demonstrate mild distal small vessel branch irregularity bilaterally. MCA branches are fairly symmetric with the regional hypoperfusion. POSTERIOR CIRCULATION: Vertebral arteries patent to the vertebrobasilar junction. Posterior inferior cerebellar arteries patent bilaterally. Basilar artery widely patent to its distal aspect. Superior cerebellar arteries patent bilaterally. Both posterior cerebral arteries arise from the basilar artery and are well opacified to their distal aspects. Mild distal small vessel irregularity within the PCAs bilaterally. No aneurysm or vascular malformation. MRA NECK FINDINGS Visualized aortic arch is of normal caliber with normal branch pattern. No high-grade stenosis at the origin of the great vessels. Visualized subclavian arteries widely patent. Right common carotid artery patent from its origin to the bifurcation. Probable minimal a centric plaque about the bifurcation without stenosis. Right ICA widely patent from the bifurcation to the skullbase without high-grade stenosis or occlusion. Left common carotid  artery patent from its origin to the bifurcation. No significant atheromatous disease about the bifurcation appreciated. Left ICA widely patent from the bifurcation to the skullbase. No flow-limiting stenosis or occlusion within the left carotid artery system. The left vertebral artery appears to arise separately from the aortic arch. Right vertebral artery arises from the subclavian artery. Vertebral arteries are widely patent along  their entire course without stenosis or vascular occlusion. IMPRESSION: MRI HEAD IMPRESSION: 1. Punctate 4 mm ischemic cortical left parietal lobe infarct. No associated hemorrhage. 2. No other acute intracranial process. 3. Generalized age-related cerebral atrophy with moderate chronic small vessel ischemic disease. MRA HEAD IMPRESSION: 1. No large or proximal arterial branch occlusion within the intracranial circulation. 2. Single short-segment severe nonocclusive right M2 stenosis as above. 3. No other focal severe or correctable stenosis identified within the intracranial circulation. 4. Mild distal small vessel atheromatous irregularity within the MCA and PCA branches bilaterally MRA NECK IMPRESSION: Negative MRA of the neck. No critical or high-grade flow-limiting stenosis identified. Electronically Signed   By: Jeannine Boga M.D.   On: 11/13/2015 03:06   Mr Jodene Nam Head/brain Wo Cm  11/13/2015  CLINICAL DATA:  Initial evaluation for new onset expressive aphasia. EXAM: MR HEAD WITHOUT CONTRAST MR CIRCLE OF WILLIS WITHOUT CONTRAST MRA OF THE NECK WITHOUT AND WITH CONTRAST TECHNIQUE: Multiplanar, multiecho pulse sequences of the brain and surrounding structures were obtained according to standard protocol without intravenous contrast.; Multiplanar and multiecho pulse sequences of the neck were obtained without and with intravenous contrast. Angiographic images of the neck were obtained using MRA technique without and with intravenous contrast.; Angiographic images of the Circle of Willis were obtained using MRA technique without intravenous contrast. CONTRAST:  46mL MULTIHANCE GADOBENATE DIMEGLUMINE 529 MG/ML IV SOLN COMPARISON:  Prior CT from re-/ 6/17. FINDINGS: MR HEAD FINDINGS Diffusion-weighted imaging demonstrates a punctate 4 mm focus of restricted diffusion within the cortical gray matter of the left parietal lobe, consistent with a tiny acute ischemic infarct (series 5, image 27). No associated  hemorrhage. No other infarct. Gray-white matter differentiation otherwise maintained. Major intracranial vascular flow voids are preserved. Diffuse prominence of the CSF containing spaces is compatible with generalized age-related cerebral atrophy. Patchy and confluent T2/FLAIR hyperintensity within the periventricular and deep white matter both cerebral hemispheres most consistent with chronic small vessel ischemic disease, moderate nature. Small vessel type changes present within the pons. No remote infarction identified. No acute or chronic intracranial hemorrhage. No mass lesion, midline shift, or mass effect. Ventricular prominence related to global parenchymal volume loss present without hydrocephalus. No extra-axial fluid collection. Craniocervical junction within normal limits. Mild degenerative spondylolysis within the visualized upper cervical spine. Pituitary gland normal. No acute abnormality about the orbits. Sequela prior bilateral lens extraction noted. Mild mucosal thickening within the max O sinuses and ethmoidal air cells. Bilateral concha bullosa noted. No nasal septal deviation. Middle air cavities grossly clear. Inner ear structures normal. Bone marrow signal intensity within normal limits. No scalp soft tissue abnormality. MR CIRCLE OF WILLIS FINDINGS ANTERIOR CIRCULATION: Visualized distal cervical segments of the internal carotid arteries are patent with antegrade flow. Petrous, cavernous, and supraclinoid segments widely patent. A1 segments, anterior communicating artery, and anterior cerebral arteries well opacified. M1 segments widely patent without stenosis or occlusion. MCA bifurcations within normal limits. There is an apparent focal high-grade stenosis within a proximal right M2 branch (series 601, image 12). This is nonocclusive of flow seen distally. Attenuation within the more superior right M2 branch favored to  be related to motion. No other focal high-grade stenosis. MCA branches  demonstrate mild distal small vessel branch irregularity bilaterally. MCA branches are fairly symmetric with the regional hypoperfusion. POSTERIOR CIRCULATION: Vertebral arteries patent to the vertebrobasilar junction. Posterior inferior cerebellar arteries patent bilaterally. Basilar artery widely patent to its distal aspect. Superior cerebellar arteries patent bilaterally. Both posterior cerebral arteries arise from the basilar artery and are well opacified to their distal aspects. Mild distal small vessel irregularity within the PCAs bilaterally. No aneurysm or vascular malformation. MRA NECK FINDINGS Visualized aortic arch is of normal caliber with normal branch pattern. No high-grade stenosis at the origin of the great vessels. Visualized subclavian arteries widely patent. Right common carotid artery patent from its origin to the bifurcation. Probable minimal a centric plaque about the bifurcation without stenosis. Right ICA widely patent from the bifurcation to the skullbase without high-grade stenosis or occlusion. Left common carotid artery patent from its origin to the bifurcation. No significant atheromatous disease about the bifurcation appreciated. Left ICA widely patent from the bifurcation to the skullbase. No flow-limiting stenosis or occlusion within the left carotid artery system. The left vertebral artery appears to arise separately from the aortic arch. Right vertebral artery arises from the subclavian artery. Vertebral arteries are widely patent along their entire course without stenosis or vascular occlusion. IMPRESSION: MRI HEAD IMPRESSION: 1. Punctate 4 mm ischemic cortical left parietal lobe infarct. No associated hemorrhage. 2. No other acute intracranial process. 3. Generalized age-related cerebral atrophy with moderate chronic small vessel ischemic disease. MRA HEAD IMPRESSION: 1. No large or proximal arterial branch occlusion within the intracranial circulation. 2. Single short-segment  severe nonocclusive right M2 stenosis as above. 3. No other focal severe or correctable stenosis identified within the intracranial circulation. 4. Mild distal small vessel atheromatous irregularity within the MCA and PCA branches bilaterally MRA NECK IMPRESSION: Negative MRA of the neck. No critical or high-grade flow-limiting stenosis identified. Electronically Signed   By: Jeannine Boga M.D.   On: 11/13/2015 03:06    Microbiology: No results found for this or any previous visit (from the past 240 hour(s)).   Labs: Basic Metabolic Panel:  Recent Labs Lab 11/12/15 0747 11/12/15 0750 11/12/15 1201 11/13/15 0557  NA 142 143  --  143  K 4.3 4.2  --  4.3  CL 107 104  --  109  CO2 25  --   --  25  GLUCOSE 119* 111*  --  122*  BUN 29* 32*  --  32*  CREATININE 1.52* 1.50* 1.57* 1.70*  CALCIUM 9.6  --   --  9.2   Liver Function Tests:  Recent Labs Lab 11/12/15 0747  AST 30  ALT 24  ALKPHOS 52  BILITOT 1.2  PROT 6.2*  ALBUMIN 3.5   No results for input(s): LIPASE, AMYLASE in the last 168 hours. No results for input(s): AMMONIA in the last 168 hours. CBC:  Recent Labs Lab 11/12/15 0747 11/12/15 0750 11/12/15 1201 11/13/15 0557 11/16/15 0646  WBC 5.8  --  5.9 6.3 5.5  NEUTROABS 3.4  --   --   --   --   HGB 11.8* 12.2* 10.9* 10.2* 10.0*  HCT 35.2* 36.0* 33.1* 30.3* 30.4*  MCV 111.7*  --  111.4* 111.4* 110.5*  PLT 127*  --  120* 110* 109*   Cardiac Enzymes: No results for input(s): CKTOTAL, CKMB, CKMBINDEX, TROPONINI in the last 168 hours. BNP: BNP (last 3 results) No results for input(s): BNP in the last 8760  hours.  ProBNP (last 3 results) No results for input(s): PROBNP in the last 8760 hours.  CBG: No results for input(s): GLUCAP in the last 168 hours.     Signed:  Louellen Molder MD.  [facility] 11/16/2015, 11:05 AM

## 2015-11-16 NOTE — Progress Notes (Signed)
ANTICOAGULATION CONSULT NOTE - Initial Consult  Pharmacy Consult for Eliquis Indication: elevated factor VIII in setting of acute stroke  Allergies  Allergen Reactions  . Lactose Intolerance (Gi)   . Nizatidine     REACTION: Rash (Axid)  . Sulfadiazine     REACTION: Hives  . Aricept [Donepezil Hcl] Rash  . Lipitor [Atorvastatin] Rash  . Penicillins Rash    Has patient had a PCN reaction causing immediate rash, facial/tongue/throat swelling, SOB or lightheadedness with hypotension: No Has patient had a PCN reaction causing severe rash involving mucus membranes or skin necrosis: No Has patient had a PCN reaction that required hospitalization No Has patient had a PCN reaction occurring within the last 10 years: No If all of the above answers are "NO",  then may proceed with Cephalosporin use.    Patient Measurements: Height: 5\' 6"  (167.6 cm) Weight: 173 lb (78.472 kg) IBW/kg (Calculated) : 63.8  Vital Signs: Temp: 97.6 F (36.4 C) (03/10 0154) Temp Source: Oral (03/10 0154) BP: 95/37 mmHg (03/10 0154) Pulse Rate: 63 (03/10 0154)  Labs:  Recent Labs  11/16/15 0646  HGB 10.0*  HCT 30.4*  PLT 109*    Estimated Creatinine Clearance: 33 mL/min (by C-G formula based on Cr of 1.7).  Assessment: 14 YOM admitted on 3/6 with acute stroke, didn't receive TPA, now found out with elevated factor VIII level. Pharmacy is consulted to start eliquis for stroke prevention. Patient with stage III CKD, baseline scr ~ 1.5, currently 1.7. Liver function looks ok.    Goal of Therapy:  Monitor platelets by anticoagulation protocol: Yes   Plan:  - Eliquis 2.5 mg po BID - Monitor CBC - Eliquis education  Maryanna Shape, PharmD, BCPS  Clinical Pharmacist  Pager: [phone]   11/16/2015,9:40 AM

## 2015-11-16 NOTE — Care Management Note (Signed)
Case Management Note  Patient Details  Name: [name] MRN: [id number] Date of Birth: May 16, 1931  Subjective/Objective:                    Action/Plan: CM consulted for benefits check on Eliquis 2.'5mg'$  BID. Benefits check revealed that the medication would be $191/month. CM met with the patient and his wife and informed them of the cost for the Eliquis after the first 30 days free. The wife became emotional and stated they could not afford that. Ms Dayal stated that the patient had been on Coumadin in the past and had done very well. She asked if he could be on Coumadin instead. CM informed Burnetta Sabin, NP and Dr Clementeen Graham. Patient left with the financial assistance paperwork for Eliquis in case needed in the future. Bedside RN updated.   Expected Discharge Date:   (Pending)               Expected Discharge Plan:  Home/Self Care  In-House Referral:     Discharge planning Services  CM Consult  Post Acute Care Choice:    Choice offered to:     DME Arranged:    DME Agency:     HH Arranged:    [place] Agency:     Status of Service:  Completed, signed off  Medicare Important Message Given:  Yes Date Medicare IM Given:    Medicare IM give by:    Date Additional Medicare IM Given:    Additional Medicare Important Message give by:     If discussed at [place] of Stay Meetings, dates discussed:    Additional Comments:  Pollie Friar, RN 11/16/2015, 3:03 PM

## 2015-11-16 NOTE — Progress Notes (Signed)
Discharge orders received. RN discussed discharge instructions with patient and wife. Patient and wife both vocalized understanding of follow up appointments, medications, s/sx of stroke. Prescriptions and discharge instructions given to patient's wife. Patient eating lunch then leaving. Patient to be escorted by wheelchair to wife's car for transport home.

## 2015-11-16 NOTE — Progress Notes (Signed)
STROKE TEAM PROGRESS NOTE   SUBJECTIVE (INTERVAL HISTORY) He had esophageal dilatation yesterday. TEE was attempted but not successful due to difficulty in passing the probe. Patient's factor VIII level has come back elevated at 310. This was not checked in his previous hypercoagulable panel labs in April 2016   OBJECTIVE Temp:  [97.2 F (36.2 C)-98.2 F (36.8 C)] 97.6 F (36.4 C) (03/10 0154) Pulse Rate:  [63-86] 63 (03/10 0154) Cardiac Rhythm:  [-] Normal sinus rhythm (03/09 1900) Resp:  [10-19] 18 (03/10 0154) BP: (95-161)/(37-90) 95/37 mmHg (03/10 0154) SpO2:  [96 %-100 %] 96 % (03/10 0154)  CBC:  Recent Labs Lab 11/12/15 0747  11/13/15 0557 11/16/15 0646  WBC 5.8  < > 6.3 5.5  NEUTROABS 3.4  --   --   --   HGB 11.8*  < > 10.2* 10.0*  HCT 35.2*  < > 30.3* 30.4*  MCV 111.7*  < > 111.4* 110.5*  PLT 127*  < > 110* 109*  < > = values in this interval not displayed.  Basic Metabolic Panel:   Recent Labs Lab 11/12/15 0747 11/12/15 0750 11/12/15 1201 11/13/15 0557  NA 142 143  --  143  K 4.3 4.2  --  4.3  CL 107 104  --  109  CO2 25  --   --  25  GLUCOSE 119* 111*  --  122*  BUN 29* 32*  --  32*  CREATININE 1.52* 1.50* 1.57* 1.70*  CALCIUM 9.6  --   --  9.2    Lipid Panel:     Component Value Date/Time   CHOL 137 11/13/2015 0557   TRIG 248* 11/13/2015 0557   HDL 13* 11/13/2015 0557   CHOLHDL 10.5 11/13/2015 0557   VLDL 50* 11/13/2015 0557   LDLCALC 74 11/13/2015 0557   HgbA1c:  Lab Results  Component Value Date   HGBA1C 5.7* 11/13/2015   Urine Drug Screen:     Component Value Date/Time   LABOPIA NONE DETECTED 11/12/2015 1143   COCAINSCRNUR NONE DETECTED 11/12/2015 1143   LABBENZ NONE DETECTED 11/12/2015 1143   AMPHETMU NONE DETECTED 11/12/2015 1143   THCU NONE DETECTED 11/12/2015 1143   LABBARB NONE DETECTED 11/12/2015 1143      IMAGING  Dg Chest 2 View 11/12/2015  Cardiomegaly.  No acute cardiopulmonary process.   Ct Head Wo  Contrast 11/12/2015  No acute intracranial abnormality. Stable atrophy and chronic white matter disease. No definite acute cortical infarction.   MRI HEAD  11/13/2015  1. Punctate 4 mm ischemic cortical left parietal lobe infarct. No associated hemorrhage. 2. No other acute intracranial process. 3. Generalized age-related cerebral atrophy with moderate chronic small vessel ischemic disease.   MRA HEAD  11/13/2015  1. No large or proximal arterial branch occlusion within the intracranial circulation. 2. Single short-segment severe nonocclusive right M2 stenosis as above. 3. No other focal severe or correctable stenosis identified within the intracranial circulation. 4. Mild distal small vessel atheromatous irregularity within the MCA and PCA branches bilaterally   MRA NECK  11/13/2015  Negative MRA of the neck. No critical or high-grade flow-limiting stenosis identified.   2D Echo with Bubble study -Left ventricle: The cavity size was normal. Wall thickness was normal. Systolic function was normal. The estimated ejection fraction was in the range of 60% to 65%. Wall motion was normal; there were no regional wall motion abnormalities. Features are consistent with a pseudonormal left ventricular filling pattern, with concomitant abnormal relaxation and increased filling pressure (  grade 2 diastolic dysfunction).   PHYSICAL EXAM Pleasant elderly Caucasian male not in distress. . Afebrile. Head is nontraumatic. Neck is supple without bruit.    Cardiac exam no murmur or gallop. Lungs are clear to auscultation. Distal pulses are well felt. Neurological Exam ;  Awake  Alert oriented x 3. Normal speech and language.eye movements full without nystagmus.fundi were not visualized. Vision acuity and fields appear normal. Hearing is normal. Palatal movements are normal. Face symmetric. Tongue midline. Normal strength, tone, reflexes and coordination. Mild action tremors both outstretched upper extremities which  diminishes with rest and increases with intention. No cogwheel rigidity or bradykinesia. Normal sensation. Gait deferred.  ASSESSMENT/PLAN [name] is a 82 y.o. male with history of hyperlipidemia, Schatzki's ring, essential tremor, sleep apnea and chronic kidney disease presenting with speech difficulty. He did not receive IV t-PA due to resolved deficits.   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 Single right M2 stenosis  MRA neck unremarkable  2D Echo with bubble study  No source of embolus  TEE to look for embolic source. Arranged with [place] for tomorrow.  If positive for PFO (patent foramen ovale), check bilateral lower extremity venous dopplers to rule out DVT as possible source of stroke. (I have made patient NPO after midnight tonight).  He has had negative OP  tele monitoring (30 d) in the past  LDL 74  HgbA1c 5.7  Factor VIII 310 (H)  Heparin 5000 units sq tid for VTE prophylaxis DIET SOFT Room service appropriate?: Yes; Fluid consistency:: Thin  aspirin 81 mg daily prior to admission, now on aspirin 325 mg daily. Given elevated Factor VIII, will start on eliquis. Asked pharmacy to consult. Will stop aspirin and sq heparin. Follow up with hematologist for future treatment.  Patient counseled to be compliant with his antithrombotic medications  Ongoing aggressive stroke risk factor management  Therapy recommendations:  OP OT, HHPT (pt/wife would rather continue OP therapy)  Disposition:  Return home with wife (continue OP therapy)  Follow up with hematologist related to long-term anticoagulation tx for Factor VIII  Hypertension  Stable  Hyperlipidemia  Home meds:  No statin, on tricor 145 mg daily resumed in hospital  Allergic to lipitor (rash)  LDL 74, goal <  70  Add statin.Continue statin at discharge  Pre-Diabetes  HgbA1c 5.7, at goal < 7.0  Other Stroke Risk Factors  Advanced age  Family hx stroke (mother)  Coronary artery disease  Obstructive sleep apnea, "resolved" per pt, not on CPAP   Other Active Problems  CHF  Benign essential tremor  B12 deficiency  Sacral ulcer  CKD stage 3  Hx PE in 1993 from what was thought to be superficial thrombosis, treated with coumadin x 20 years due to this and strong family hx of phlebitis. Genetic workup thus far has been negative. Stopped due to fall risk.  Hospital day # 4  I have personally examined this patient, reviewed notes, independently viewed imaging studies, participated in medical decision making and plan of care. I have made any additions or clarifications directly to the above note. Agree with note above. Patient has elevated factor VIII levels it is unclear if this is held due to 3 and responsible for increased incidence of venous clots in his family or just an acute phase reactant.  Recommend starting eliquis at least for the short-term and the patient is seen by hematologist for consultation to determine if he needs long-term anticoagulation. I have explained this to the patient and wife and answered questions. Follow-up with me as an outpatient in 2 months. Stroke team will sign off. Kindly call for questions.                                 Antony Contras, MD Medical Director [facility] Pager: [phone] 11/16/2015 11:25 AM   To contact Stroke Continuity provider, please refer to [email or web address] After hours, contact General Neurology

## 2015-11-16 NOTE — Progress Notes (Signed)
Neuro assessment unchanged.

## 2015-11-20 ENCOUNTER — Telehealth: Payer: Self-pay | Admitting: *Deleted

## 2015-11-20 DIAGNOSIS — R791 Abnormal coagulation profile: Secondary | ICD-10-CM

## 2015-11-20 DIAGNOSIS — D696 Thrombocytopenia, unspecified: Secondary | ICD-10-CM

## 2015-11-20 DIAGNOSIS — I639 Cerebral infarction, unspecified: Secondary | ICD-10-CM

## 2015-11-20 DIAGNOSIS — Z86711 Personal history of pulmonary embolism: Secondary | ICD-10-CM

## 2015-11-20 NOTE — Telephone Encounter (Signed)
Transition Care Management Follow-up Telephone Call   Date discharged? 11/16/15   How have you been since you were released from the hospital? Doing well, back to baseline.   Do you understand why you were in the hospital? yes   Do you understand the discharge instructions? yes   Where were you discharged to? home   Items Reviewed:  Medications reviewed: yes - back on Couamdin, changed statin  Allergies reviewed: yes  Dietary changes reviewed: no  Referrals reviewed: home PT, hematology, neurology   Functional Questionnaire:   Activities of Daily Living (ADLs):   He states they are independent in the following: feeding, continence, grooming, toileting and dressing States they require assistance with the following: ambulation and bathing and hygiene   Any transportation issues/concerns?: no   Any patient concerns? no   Confirmed importance and date/time of follow-up visits scheduled yes, 11/22/15 @ 1230  Provider Appointment booked with Ria Bush, MD  Confirmed with patient if condition begins to worsen call PCP or go to the ER.  Patient was given the office number and encouraged to call back with question or concerns.  : yes

## 2015-11-20 NOTE — Addendum Note (Signed)
Addended by: Ria Bush on: 11/20/2015 06:31 PM   Modules accepted: Orders

## 2015-11-20 NOTE — Telephone Encounter (Signed)
Patient's wife called back requesting a referral to [facility] cancer center to see the hematologist.  This was advised at hospital discharge but no appointment was scheduled.  Dr. Jana Hakim agreed to see the patient, but will need a referral first.  Please advise.

## 2015-11-20 NOTE — Telephone Encounter (Signed)
Referral placed.

## 2015-11-21 ENCOUNTER — Other Ambulatory Visit: Payer: Self-pay | Admitting: *Deleted

## 2015-11-21 NOTE — Patient Outreach (Signed)
[facility]) Care Management  11/21/2015  [name] [birth year] UK:[id number]   Subjective: Telephone call to patient's home number, spoke with patient, and HIPAA verified.   Patient gave [facility] verbal authorization to speak with wife [name]) and daughter [name]) regarding his healthcare needs as needed.  Discussed [facility] Care Management EMMI program and patient in agreement to receive services. Patient states he has a hospital follow up appointment scheduled with primary MD [name] on 11/22/15.   Patient and wife states patient does not have any community care coordination or transition of care needs at this time and is in agreement to a follow up call next week after MD visit.  Patient states his wife, who is nurse, takes very good care of him.    Wife states she is currently working with Museum/gallery curator to obtain respite care for patient while she works 4 hours a month.   Wife states she will let RNCM know if any assistance neededd for community resources for respite care.  Patient and wife states, patient has no medication assistance needs or questions for [facility] pharmacy.   Patient has no transportation issues.    Patient's wife states patient does have some balance issues, history of falls, and is currently attending physical therapy and exercise class at [facility] on Tuesdays and Thursdays.   Wife states, patient had a severe fall in February of 2016 with fracture of C7 vertebrae and spent some time in rehab.    Wife states patient has been doing well since rehab stay until recent hospitalization for stroke.   Wife states patient also has benign tremors and no history of Parkinson's disease.  Wife states patient has severe hand shakes, tremors, and he uses heavy silverware as a modification while eating.  Patient will continue to receive [place] [facility] EMMI Stroke program services.   Patient and wife given RNCM's contact number, [facility] Care Management  main phone number, and 24 hour Nurse Advice line phone number.  Objective:  Per Epic case review: Patient hospitalized  11/12/15 - 11/16/15 for Acute ischemic stroke.    Patient also has a history of congestive heart failure, GERD with history of schatzki's ring,  and chronic kidney disease stage 3.    Assessment:  Received EMMI Stroke red alert referral on 11/21/15.    Report date: 11/21/15.    Red flag reported on 11/20/15.  Red flag trigger: Patient answered no to scheduled follow up appointment.   Patient will continue to receive [place] [facility] EMMI Stroke program services.  EMMI Stroke program dates 11/21/15 - 12/22/15.    Plan: RNCM will call patient within 1 week for EMMI Stroke follow up call regarding outcome of patient's follow up primary MD visit on 11/22/15. RNCM will follow up with patient and assess further EMMI Stroke program follow up needs within 1 week.   Arista Kettlewell H. Annia Friendly, BSN, [place] [facility] Telephonic CM Phone: [phone] Fax: [phone]

## 2015-11-22 ENCOUNTER — Ambulatory Visit (INDEPENDENT_AMBULATORY_CARE_PROVIDER_SITE_OTHER): Payer: Medicare Other | Admitting: Family Medicine

## 2015-11-22 ENCOUNTER — Encounter: Payer: Self-pay | Admitting: Family Medicine

## 2015-11-22 VITALS — BP 110/62 | HR 88 | Temp 97.9°F | Wt 169.2 lb

## 2015-11-22 DIAGNOSIS — I639 Cerebral infarction, unspecified: Secondary | ICD-10-CM | POA: Diagnosis not present

## 2015-11-22 DIAGNOSIS — Z7901 Long term (current) use of anticoagulants: Secondary | ICD-10-CM | POA: Diagnosis not present

## 2015-11-22 DIAGNOSIS — D696 Thrombocytopenia, unspecified: Secondary | ICD-10-CM | POA: Diagnosis not present

## 2015-11-22 DIAGNOSIS — Z86711 Personal history of pulmonary embolism: Secondary | ICD-10-CM

## 2015-11-22 DIAGNOSIS — N183 Chronic kidney disease, stage 3 unspecified: Secondary | ICD-10-CM

## 2015-11-22 DIAGNOSIS — R1310 Dysphagia, unspecified: Secondary | ICD-10-CM

## 2015-11-22 DIAGNOSIS — R791 Abnormal coagulation profile: Secondary | ICD-10-CM

## 2015-11-22 LAB — PROTIME-INR
INR: 1.9 ratio — ABNORMAL HIGH (ref 0.8–1.0)
Prothrombin Time: 20.6 s — ABNORMAL HIGH (ref 9.6–13.1)

## 2015-11-22 LAB — MICROALBUMIN / CREATININE URINE RATIO
CREATININE, U: 147.6 mg/dL
MICROALB UR: 16.2 mg/dL — AB (ref 0.0–1.9)
MICROALB/CREAT RATIO: 11 mg/g (ref 0.0–30.0)

## 2015-11-22 NOTE — Assessment & Plan Note (Addendum)
Symptoms have fully resolved.  Back on coumadin. Check INR today and pt to establish with coumadin clinic.

## 2015-11-22 NOTE — Progress Notes (Signed)
This encounter was created in error - please disregard.

## 2015-11-22 NOTE — Assessment & Plan Note (Signed)
Found during recent hospitalization.  Planning on scheduling f/u with heme.

## 2015-11-22 NOTE — Assessment & Plan Note (Signed)
Deteriorated recently. Pending appt with heme for further evaluation. ?ITP

## 2015-11-22 NOTE — Progress Notes (Signed)
BP 110/62 mmHg  Pulse 88  Temp(Src) 97.9 F (36.6 C) (Oral)  Wt 169 lb 4 oz (76.771 kg)   CC: hosp f/u visit  Subjective:    Patient ID: [name], male    DOB: 1930-11-07, 82 y.o.   MRN: [id number]  HPI: LATWAN DANOWSKI is a 82 y.o. male presenting on 11/22/2015 for Follow-up   Records reviewed. Recent hospitalization for acute ischemic stroke, neurological symptoms included speech impairment ([place]) - resolved upon arrival to CT. MRI showed small 25mm acute ischemic L parietal stroke. MRA showed L M2 short segment severe stenosis. 2D echo ok, LE dopplers normal. TEE was unable to be completed.   Back on coumadin over last 6 days - found to have factor VIII elevation. Pending f/u with hematology Dr Marin Olp but planning on establishing more locally ([facility]). Also to evaluate recent drop in plt (discharge 109). LDL 74, A1c 5.7%. zocor was started as well as fibrate continued.   He did receive esophageal dilation as well.  Planned to continue outpt PT at [facility].  To check INR today.   Admit date: 11/12/2015 Discharge date: 11/16/2015 hosp f/u phone call: 11/20/2015  Recommendations for Outpatient Follow-up:  1. Discharge home with outpatient physical therapy. 2. Patient will be discharged on Coumadin and simvastatin. Please monitor INR as outpatient. He needs to follow-up with Dr. Marin Olp as outpatient. Please repeat factor 8 assay in 6-8 weeks.  3. Please monitor platelets during outpatient follow-up. 4. Follow up with Dr. Leonie Man in 2 months.  Discharge Diagnoses:  Principal Problem:  Acute ischemic stroke [facility]) Active Problems:  Thrombocytopenia (HCC)  Aphasia  Stroke ([place])  CHF (congestive heart failure) (HCC)  Hypotension  Benign essential tremor  B12 deficiency  Sacral ulcer  Schatzki's ring  Esophageal stenosis Discharge Condition: fair Diet recommendation: Healthy CODE STATUS: Full code  Relevant past medical, surgical, family  and social history reviewed and updated as indicated. Interim medical history since our last visit reviewed. Allergies and medications reviewed and updated. Current Outpatient Prescriptions on File Prior to Visit  Medication Sig  . albuterol (PROVENTIL HFA;VENTOLIN HFA) 108 (90 BASE) MCG/ACT inhaler Inhale 2 puffs into the lungs every 6 (six) hours as needed for wheezing or shortness of breath.  . Biotin 2500 MCG CAPS Take 1 capsule by mouth daily.  . Cholecalciferol (VITAMIN D) 2000 UNITS CAPS Take 1 capsule by mouth daily.  . clotrimazole (LOTRIMIN) 1 % cream Apply 1 application topically 2 (two) times daily. (Patient taking differently: Apply 1 application topically 2 (two) times daily as needed. For rash)  . Coenzyme Q10 (COQ-10) 100 MG CAPS Take 1 capsule by mouth daily.   . Cyanocobalamin (B-12 PO) Take 10,000 mcg by mouth every Monday, Wednesday, and Friday.   Marland Kitchen dexlansoprazole (DEXILANT) 60 MG capsule Take 1 capsule (60 mg total) by mouth daily.  Marland Kitchen docusate sodium (COLACE) 100 MG capsule Take 100 mg by mouth at bedtime.   . fenofibrate (TRICOR) 145 MG tablet TAKE 1 TABLET DAILY  . fexofenadine (ALLEGRA) 180 MG tablet Take 180 mg by mouth daily.   Marland Kitchen gabapentin (NEURONTIN) 100 MG capsule Take 2 capsules (200 mg total) by mouth 3 (three) times daily. (Patient taking differently: Take 200-300 mg by mouth 2 (two) times daily. Take 3 capsules in the morning Take 2 capsules in the evening)  . Multiple Vitamins-Minerals (MULTIVITAMIN PO) Take by mouth daily.  Marland Kitchen oxybutynin (DITROPAN) 5 MG tablet Take 5 mg by mouth 2 (two)  times daily.   . simvastatin (ZOCOR) 40 MG tablet Take 1 tablet (40 mg total) by mouth daily.  Marland Kitchen venlafaxine (EFFEXOR) 75 MG tablet Take 75 mg by mouth daily.  Marland Kitchen warfarin (COUMADIN) 2.5 MG tablet Take 1 tablet (2.5 mg total) by mouth daily at 6 PM.   No current facility-administered medications on file prior to visit.    Review of Systems Per HPI unless specifically  indicated in ROS section     Objective:    BP 110/62 mmHg  Pulse 88  Temp(Src) 97.9 F (36.6 C) (Oral)  Wt 169 lb 4 oz (76.771 kg)  Wt Readings from Last 3 Encounters:  11/22/15 169 lb 4 oz (76.771 kg)  11/12/15 173 lb (78.472 kg)  10/31/15 172 lb 12.8 oz (78.382 kg)    Physical Exam  Constitutional: He appears well-developed and well-nourished. No distress.  HENT:  Mouth/Throat: Oropharynx is clear and moist. No oropharyngeal exudate.  Cardiovascular: Normal rate, regular rhythm, normal heart sounds and intact distal pulses.   No murmur heard. Pulmonary/Chest: Effort normal and breath sounds normal. No respiratory distress. He has no wheezes. He has no rales.  Musculoskeletal: He exhibits edema (trace).  Psychiatric: He has a normal mood and affect.  Nursing note and vitals reviewed.     Assessment & Plan:   Problem List Items Addressed This Visit    Thrombocytopenia ([place])    Deteriorated recently. Pending appt with heme for further evaluation. ?ITP      Personal history of pulmonary embolism   Elevated factor VIII level    Found during recent hospitalization.  Planning on scheduling f/u with heme.       Dysphagia    Latest dilation this hospitalization 11/2015.      CKD (chronic kidney disease) stage 3, GFR 30-59 ml/min    Check microalb if pt able to leave sample today.      Relevant Orders   Microalbumin / creatinine urine ratio   Acute ischemic stroke ([place]) - Primary    Symptoms have fully resolved.  Back on coumadin. Check INR today and pt to establish with coumadin clinic.       Other Visit Diagnoses    Long-term (current) use of anticoagulants        Relevant Orders    INR        Follow up plan: Return as needed.

## 2015-11-22 NOTE — Patient Instructions (Addendum)
Pass by up front to reestablish with our coumadin clinic. microalbumin and INR today.  Keep f/u with heme and neurology.

## 2015-11-22 NOTE — Addendum Note (Signed)
Addended by: Marchia Bond on: 11/22/2015 03:26 PM   Modules accepted: Orders

## 2015-11-22 NOTE — Telephone Encounter (Signed)
Placed Referral on [facility], sent Rhys Martini a message and also spoke to [place] patients wife that Referral was sent to Dr Jana Hakim for review. They will call the patient to schedule.

## 2015-11-22 NOTE — Progress Notes (Signed)
Pre visit review using our clinic review tool, if applicable. No additional management support is needed unless otherwise documented below in the visit note. 

## 2015-11-22 NOTE — Assessment & Plan Note (Signed)
Check microalb if pt able to leave sample today.

## 2015-11-22 NOTE — Assessment & Plan Note (Signed)
Latest dilation this hospitalization 11/2015.

## 2015-11-28 ENCOUNTER — Encounter: Payer: Self-pay | Admitting: *Deleted

## 2015-11-28 ENCOUNTER — Other Ambulatory Visit: Payer: Self-pay | Admitting: *Deleted

## 2015-11-28 ENCOUNTER — Ambulatory Visit: Payer: Self-pay | Admitting: *Deleted

## 2015-11-28 NOTE — Patient Outreach (Addendum)
[facility]) Care Management  11/28/2015  [name] February 22, 1931 [id number]  Subjective:  Received voice mail message from patient's wife [name]) states she and patient are currently running errands,  would like to move today's telephone appointment from 2:00pm to 3:00pm today.   Patient's wife requested call back.   Telephone call to patient's wife via mobile phone number and HIPAA verified.  Patient's wife stated patient's name, date of birth, and address.  Patient has given RNCM verbal authorization to speak with wife regarding his healthcare needs.   RNCM advised patient's wife that today's telephone appointment will be moved to 3:00pm per her request.    Patient will continue to receive [facility] EMMI Stroke program follow up.   Telephone call to patient's home number, spoke with patient and patient's wife.   HIPAA verified.   Patient and wife state patient's appointment with primary MD went well.   Patient states he is doing very well and making great progress.  Patient will be attending the coumadin clinic in the future for blood draws and follow up as needed.   Patient's wife has contracted her oncologist (Dr. Lurline Del)  to see if he will agree to see patient for follow up hematology care.   Patient states if Dr. Jana Hakim will not be able to see patient, will go with another MD in his practice.   Patient's wife states she has been in contact with [employer] and has enough respite hours available for the March and part of April.   States  Museum/gallery curator has connected her with another respite care community resource through [employer] in [place], [place].   States she is going to meet with a representative from [place] next week and discuss possible respite resources. Patient and wife states they do not need in further care management services at this time and will contact [facility] RNCM if any needs arise in the future.   Patient and wife in agreement to  receive information regarding [place] Management services.   Objective: Per Epic case review: Patient hospitalized 11/12/15 - 11/16/15 for Acute ischemic stroke. Patient also has a history of congestive heart failure, GERD with history of schatzki's ring, and chronic kidney disease stage 3.   Assessment: Received EMMI Stroke red alert referral on 11/21/15. Report date: 11/21/15. Red flag reported on 11/20/15. Red flag trigger: Patient answered no to scheduled follow up appointment. Patient has no further [facility] Care Management EMMI Stroke program or [facility] Care Management service needs at this time. EMMI Stroke [facility] Care Management program completed and patient will continue to receive EMMI automated calls per prescription.  Plan: RNCM will send patient successful outreach letter, [facility] magnet, and pamphlet. RNCM will send case closure request due to [facility] Stroke program completion to Josepha Pigg at [facility].      Caellum Mancil H. Annia Friendly, BSN, [place] [facility] Telephonic CM Phone: [phone] Fax: [phone]

## 2015-11-29 DIAGNOSIS — T466X5D Adverse effect of antihyperlipidemic and antiarteriosclerotic drugs, subsequent encounter: Secondary | ICD-10-CM | POA: Diagnosis not present

## 2015-11-29 DIAGNOSIS — L820 Inflamed seborrheic keratosis: Secondary | ICD-10-CM | POA: Diagnosis not present

## 2015-11-29 DIAGNOSIS — L299 Pruritus, unspecified: Secondary | ICD-10-CM | POA: Diagnosis not present

## 2015-11-29 DIAGNOSIS — L821 Other seborrheic keratosis: Secondary | ICD-10-CM | POA: Diagnosis not present

## 2015-12-06 ENCOUNTER — Encounter: Payer: Self-pay | Admitting: Hematology

## 2015-12-06 ENCOUNTER — Telehealth: Payer: Self-pay | Admitting: Hematology

## 2015-12-06 ENCOUNTER — Ambulatory Visit (INDEPENDENT_AMBULATORY_CARE_PROVIDER_SITE_OTHER): Payer: Medicare Other | Admitting: *Deleted

## 2015-12-06 DIAGNOSIS — Z5181 Encounter for therapeutic drug level monitoring: Secondary | ICD-10-CM

## 2015-12-06 DIAGNOSIS — I639 Cerebral infarction, unspecified: Secondary | ICD-10-CM | POA: Diagnosis not present

## 2015-12-06 LAB — POCT INR: INR: 2.3

## 2015-12-06 MED ORDER — WARFARIN SODIUM 2.5 MG PO TABS: ORAL_TABLET | ORAL | Status: DC

## 2015-12-06 MED ORDER — WARFARIN SODIUM 2.5 MG PO TABS
ORAL_TABLET | ORAL | Status: DC
Start: 2015-12-06 — End: 2016-03-13

## 2015-12-06 NOTE — Telephone Encounter (Signed)
Contacted referring office regarding reaching out to info pt of upcoming appt 12/14/15 at 9 am.  Verified address and insurance, mailed out new pt packet, scheduled intake

## 2015-12-06 NOTE — Telephone Encounter (Signed)
Pts wife called to confirm appt.

## 2015-12-06 NOTE — Progress Notes (Signed)
Pre visit review using our clinic review tool, if applicable. No additional management support is needed unless otherwise documented below in the visit note. 

## 2015-12-10 ENCOUNTER — Telehealth: Payer: Self-pay | Admitting: Hematology

## 2015-12-10 NOTE — Telephone Encounter (Signed)
COMPLETED INTAKE

## 2015-12-11 ENCOUNTER — Telehealth: Payer: Self-pay | Admitting: *Deleted

## 2015-12-11 NOTE — Telephone Encounter (Signed)
Fax from [facility] about possible interaction between simvastatin and warfarin causing increased bleeding and rhabdomyolysis. Form in your IN box.

## 2015-12-13 ENCOUNTER — Encounter: Payer: Medicare Other | Admitting: Family Medicine

## 2015-12-14 ENCOUNTER — Ambulatory Visit (HOSPITAL_BASED_OUTPATIENT_CLINIC_OR_DEPARTMENT_OTHER): Payer: Medicare Other | Admitting: Hematology

## 2015-12-14 ENCOUNTER — Telehealth: Payer: Self-pay | Admitting: Hematology

## 2015-12-14 ENCOUNTER — Ambulatory Visit (HOSPITAL_BASED_OUTPATIENT_CLINIC_OR_DEPARTMENT_OTHER): Payer: Medicare Other

## 2015-12-14 VITALS — BP 117/47 | HR 80 | Temp 98.0°F | Resp 18 | Ht 66.0 in | Wt 167.6 lb

## 2015-12-14 DIAGNOSIS — D539 Nutritional anemia, unspecified: Secondary | ICD-10-CM

## 2015-12-14 DIAGNOSIS — Z86711 Personal history of pulmonary embolism: Secondary | ICD-10-CM | POA: Diagnosis not present

## 2015-12-14 DIAGNOSIS — Z86718 Personal history of other venous thrombosis and embolism: Secondary | ICD-10-CM | POA: Diagnosis not present

## 2015-12-14 DIAGNOSIS — Z8042 Family history of malignant neoplasm of prostate: Secondary | ICD-10-CM

## 2015-12-14 DIAGNOSIS — D696 Thrombocytopenia, unspecified: Secondary | ICD-10-CM

## 2015-12-14 DIAGNOSIS — I639 Cerebral infarction, unspecified: Secondary | ICD-10-CM | POA: Diagnosis not present

## 2015-12-14 LAB — CBC & DIFF AND RETIC
BASO%: 0.4 % (ref 0.0–2.0)
Basophils Absolute: 0 10*3/uL (ref 0.0–0.1)
EOS ABS: 0.2 10*3/uL (ref 0.0–0.5)
EOS%: 2.8 % (ref 0.0–7.0)
HCT: 34.4 % — ABNORMAL LOW (ref 38.4–49.9)
HGB: 11.5 g/dL — ABNORMAL LOW (ref 13.0–17.1)
Immature Retic Fract: 12.8 % — ABNORMAL HIGH (ref 3.00–10.60)
LYMPH%: 23.1 % (ref 14.0–49.0)
MCH: 37.8 pg — ABNORMAL HIGH (ref 27.2–33.4)
MCHC: 33.4 g/dL (ref 32.0–36.0)
MCV: 113.2 fL — AB (ref 79.3–98.0)
MONO#: 0.5 10*3/uL (ref 0.1–0.9)
MONO%: 8 % (ref 0.0–14.0)
NEUT#: 3.7 10*3/uL (ref 1.5–6.5)
NEUT%: 65.7 % (ref 39.0–75.0)
PLATELETS: 113 10*3/uL — AB (ref 140–400)
RBC: 3.04 10*6/uL — AB (ref 4.20–5.82)
RDW: 14.6 % (ref 11.0–14.6)
RETIC CT ABS: 93.33 10*3/uL (ref 34.80–93.90)
Retic %: 3.07 % — ABNORMAL HIGH (ref 0.80–1.80)
WBC: 5.6 10*3/uL (ref 4.0–10.3)
lymph#: 1.3 10*3/uL (ref 0.9–3.3)

## 2015-12-14 LAB — TECHNOLOGIST REVIEW

## 2015-12-14 LAB — COMPREHENSIVE METABOLIC PANEL
ALT: 29 U/L (ref 0–55)
ANION GAP: 7 meq/L (ref 3–11)
AST: 39 U/L — ABNORMAL HIGH (ref 5–34)
Albumin: 3.7 g/dL (ref 3.5–5.0)
Alkaline Phosphatase: 47 U/L (ref 40–150)
BUN: 21.2 mg/dL (ref 7.0–26.0)
CHLORIDE: 106 meq/L (ref 98–109)
CO2: 28 meq/L (ref 22–29)
Calcium: 9.9 mg/dL (ref 8.4–10.4)
Creatinine: 1.7 mg/dL — ABNORMAL HIGH (ref 0.7–1.3)
EGFR: 37 mL/min/{1.73_m2} — AB (ref >90)
Glucose: 107 mg/dl (ref 70–140)
POTASSIUM: 4.7 meq/L (ref 3.5–5.1)
Sodium: 142 mEq/L (ref 136–145)
Total Bilirubin: 0.93 mg/dL (ref 0.20–1.20)
Total Protein: 6.5 g/dL (ref 6.4–8.3)

## 2015-12-14 LAB — CHCC SMEAR

## 2015-12-14 LAB — LACTATE DEHYDROGENASE: LDH: 153 U/L (ref 125–245)

## 2015-12-14 NOTE — Progress Notes (Signed)
Marland Kitchen    HEMATOLOGY/ONCOLOGY CONSULTATION NOTE  Date of Service: 12/14/2015  Patient Care Team: Ria Bush, MD as PCP - General  CHIEF COMPLAINTS/PURPOSE OF CONSULTATION:   Thrombocytopenia, elevated factor VIII, history of pulmonary embolism. Ischemic CVA.   HISTORY OF PRESENTING ILLNESS:   [name] is a wonderful 82 y.o. male who has been referred to [place] by Dr .Ria Bush, MD for evaluation and management of elevated factor VIII , thrombocytopenia, history of pulmonary embolism and ischemic CVA.  Patient has a history of multiple medical comorbidities including hypertension, dyslipidemia, coronary artery disease, CKD stage III, sleep apnea, prediabetes and a significant pulmonary embolism in the early 1990s which caused him to pass out. He notes that he had received TPA. He reports some family history of venous thromboembolism and his mother, 2 brothers with DVTs in their 78s who were overweight. Sister had pulmonary embolism after shoulder surgery at age 17 years.. Patient reports he was chronically on Coumadin until August 2016 when he is Coumadin was stopped (?fall risk) and he was switched to aspirin 81 mg by mouth daily.  Patient was recently admitted in early March 2017 with an acute ischemic left parietal infarct leading to speech impairment that was subsequently resolved. No focal neurological weakness. He was replaced on Coumadin due to uncertainty regarding whether this was an atherosclerotic event versus related to a hypercoagulable state. Of note the patient had a hypercoagulable panel by his primary care physician in April 2016 that was unremarkable.  Patient's primary cardiologist is Dr. Leonie Man.  He has had some chronic macrocytic anemia with hemoglobin varying from 10-12.3 from 2015 with macrocytic indices with normal RDW. Platelets elevated from 66k to 145k during this time. Labs today show a hemoglobin of 11.5 with an MCV of 113.2 and normal RDW and  platelets of 113k. Patient notes no issues with infections. No overt GI bleeding. No other overt blood loss.  No evidence of liver disease. Patient previously had a CT of the abdomen 2015 that showed no splenomegaly. TSH was within normal limits in January 2017.   MEDICAL HISTORY:  Past Medical History  Diagnosis Date  . GERD (gastroesophageal reflux disease)     h/o PUD  . CAD (coronary artery disease)     cath 2000 30% single vessel, normal nuclear stress test 12/03/2010, no evidence ischemia  . CKD (chronic kidney disease) stage 3, GFR 30-59 ml/min     baseline Cr 1.7  . Depression   . Hx pulmonary embolism 08/1991    negative hypercoagulable panel 12/2014  . History of phlebitis   . Allergic rhinitis   . Urge incontinence   . Elevated PSA     previous-normalized (followed by Dr. Jeffie Pollock, rec no repeat unless urinary sxs)  . HLD (hyperlipidemia)     hypertriglyceridemia  . Asthma     remote  . Arthritis   . Blood transfusion 1990's  . Schatzki's ring 02/2012    s/p dilation [name]  . History of fracture of right hip   . Hearing loss 06/2012    eval - rec annual exam  . Essential tremor 09/27/2008    reviewed eval by Dr Jannifer Franklin in chart 2011   . Prediabetes   . DISH (diffuse idiopathic skeletal hyperostosis) 03/2013    lumbar spine on xray  . DDD (degenerative disc disease) 03/2013    by CT, diffuse multilevel cervical and lumbar spondylosis  . Osteoporosis 11/2013    DEXA T score -2.7  . Macrocytic anemia  2014    stable B12/folate and periph smear 05/2013, again periph smear 2017 with mature cells, mild dacrocytosis  . Sleep apnea     no CPAP- sleep apnea "cleared up 15 years ago"  . Dizziness     multifactorial, s/p PT at [facility] 06/2014 with HEP  . History of pyelonephritis 05/2014    hospitalization with sepsis  . Closed C7 fracture ([place]) 11/06/2014  . UTI (urinary tract infection) 05/13/2015  . UTI (urinary tract infection) due to Enterococcus 05/13/2015  . Candidal  urethritis in male 06/15/2015    SURGICAL HISTORY: Past Surgical History  Procedure Laterality Date  . Tonsillectomy  1965  . Hernia repair  1979    Left  . Cystoscopy  1992    for kidney stones  . Exploratory laparotomy  1996    with incidental appendectomy  . Appendectomy  1996  . Knee arthroscopy  03/24/02    Right (Dr. Mauri Pole)  . Orif femoral neck fracture w/ dhs  08/03/02    Right (Dr. Mauri Pole)  . Ct abd w & pelvis wo cm  07/2001    Scarring of right lung, stable negative o/w  . Ct abd w & pelvis wo cm  11/2000    ? stones, right LL scarring  . V/q scan  06/1999    negative  . [place]oscopy  1998    [place]  . US echocardiography  12/28/2007    Mild aortic valve calcification EF 55%, basically nml  . Carotid u/s  12/28/2007    nml  . Eeg  11/12/2009    nml  . Mri  11/2009    Head, nml  . Cataract extraction  Jan, March 2011    bilateral  . Colonoscopy  07/2010    5 polyps, adenomatous, rec rpt 3 yrs  . Esophagogastroduodenoscopy  02/2012    dilation of schatzki's ring [name]  . Upper gastrointestinal endoscopy    . Colonoscopy  04/2014    3 polyps, adenomatous, f/u open ended given age [name]  . Cardiac catheterization  2000    30% one vessel  . Cardiovascular stress test  03/2015    no ischemia, low risk, EF 76%  . Esophagogastroduodenoscopy N/A 11/15/2015    Procedure: ESOPHAGOGASTRODUODENOSCOPY (EGD);  Surgeon: Gatha Mayer, MD;  Location[facility] ENDOSCOPY;  Service: Endoscopy;  Laterality: N/A;    SOCIAL HISTORY: Social History   Social History  . Marital Status: Married    Spouse Name: N/A  . Number of Children: 2  . Years of Education: N/A   Occupational History  . 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 Smoking status: Never Smoker   . Smokeless tobacco: Never Used  . Alcohol Use: No  . Drug Use: No  . Sexual Activity: Not on file   Other Topics Concern  . Not on file   Social History Narrative    Married with 2 children   Husband of Daekwon Beswick    Retired: was principal    Activity: walks dog 2-3 times daily about 62mn, frequent stops    Diet: some water, good fruits/vegetables, fish 1x/wk, no sodas.      Advanced directives: HChauncey Readingis wife, KPerrin Smack Does not want prolonged life support if terminal      Patient does not drink caffeine.   Patient is right handed.     FAMILY HISTORY: Family History  Problem Relation Age of Onset  . Stroke Mother   .  Hypertension Mother   . Cancer Brother     prostate with mets  . Blindness Brother     legally  . Diabetes Brother   . Pulmonary embolism Sister     from shoulder operation  . Alcohol abuse Brother   . Colon cancer Neg Hx   . Esophageal cancer Neg Hx   . Rectal cancer Neg Hx   . Stomach cancer Neg Hx     ALLERGIES:  is allergic to lactose intolerance (gi); nizatidine; sulfadiazine; aricept; lipitor; and penicillins.  MEDICATIONS:  Current Outpatient Prescriptions  Medication Sig Dispense Refill  . albuterol (PROVENTIL HFA;VENTOLIN HFA) 108 (90 BASE) MCG/ACT inhaler Inhale 2 puffs into the lungs every 6 (six) hours as needed for wheezing or shortness of breath.    . Biotin 2500 MCG CAPS Take 1 capsule by mouth daily.    . Cholecalciferol (VITAMIN D) 2000 UNITS CAPS Take 1 capsule by mouth daily.    . clotrimazole (LOTRIMIN) 1 % cream Apply 1 application topically 2 (two) times daily. (Patient taking differently: Apply 1 application topically 2 (two) times daily as needed. For rash) 30 g 0  . Coenzyme Q10 (COQ-10) 100 MG CAPS Take 1 capsule by mouth daily.     . Cyanocobalamin (B-12 PO) Take 10,000 mcg by mouth every Monday, Wednesday, and Friday.     Marland Kitchen dexlansoprazole (DEXILANT) 60 MG capsule Take 1 capsule (60 mg total) by mouth daily. 30 capsule 0  . docusate sodium (COLACE) 100 MG capsule Take 100 mg by mouth at bedtime.     . fenofibrate (TRICOR) 145 MG tablet TAKE 1 TABLET DAILY 90 tablet 2  . fexofenadine (ALLEGRA)  180 MG tablet Take 180 mg by mouth daily.     Marland Kitchen gabapentin (NEURONTIN) 100 MG capsule Take 2 capsules (200 mg total) by mouth 3 (three) times daily. (Patient taking differently: Take 200-300 mg by mouth 2 (two) times daily. Take 3 capsules in the morning Take 2 capsules in the evening) 540 capsule 1  . Multiple Vitamins-Minerals (MULTIVITAMIN PO) Take by mouth daily.    Marland Kitchen oxybutynin (DITROPAN) 5 MG tablet Take 5 mg by mouth 2 (two) times daily.     . simvastatin (ZOCOR) 40 MG tablet Take 1 tablet (40 mg total) by mouth daily. 30 tablet 0  . venlafaxine (EFFEXOR) 75 MG tablet Take 75 mg by mouth daily.    Marland Kitchen warfarin (COUMADIN) 2.5 MG tablet Take as directed by anti-coagulation clinic. 7 tablet 0  . warfarin (COUMADIN) 2.5 MG tablet Take as directed by anti-coagulation clinic. 90 tablet 0   No current facility-administered medications for this visit.    REVIEW OF SYSTEMS:    10 Point review of Systems was done is negative except as noted above.  PHYSICAL EXAMINATION: ECOG PERFORMANCE STATUS: 2 - Symptomatic, <50% confined to bed  . Filed Vitals:   12/14/15 0900  BP: 117/47  Pulse: 80  Temp: 98 F (36.7 C)  Resp: 18   Filed Weights   12/14/15 0900  Weight: 167 lb 9.6 oz (76.023 kg)   .Body mass index is 27.06 kg/(m^2).  GENERAL:alert, in no acute distress and comfortable SKIN: skin color, texture, turgor are normal, no rashes or significant lesions EYES: normal, conjunctiva are pink and non-injected, sclera clear OROPHARYNX:no exudate, no erythema and lips, buccal mucosa, and tongue normal  NECK: supple, no JVD, thyroid normal size, non-tender, without nodularity LYMPH:  no palpable lymphadenopathy in the cervical, axillary or inguinal LUNGS: clear to auscultation with  normal respiratory effort HEART: regular rate & rhythm,  no murmurs and no lower extremity edema ABDOMEN: abdomen soft, non-tender, normoactive bowel sounds , No palpable hepatosplenomegaly. Musculoskeletal: no  cyanosis of digits and no clubbing  PSYCH: alert & oriented x 3 with fluent speech NEURO: no focal motor/sensory deficits  LABORATORY DATA:  I have reviewed the data as listed  . CBC Latest Ref Rng 11/16/2015 11/13/2015 11/12/2015  WBC 4.0 - 10.5 K/uL 5.5 6.3 5.9  Hemoglobin 13.0 - 17.0 g/dL 10.0(L) 10.2(L) 10.9(L)  Hematocrit 39.0 - 52.0 % 30.4(L) 30.3(L) 33.1(L)  Platelets 150 - 400 K/uL 109(L) 110(L) 120(L)    . CMP Latest Ref Rng 11/13/2015 11/12/2015 11/12/2015  Glucose 65 - 99 mg/dL 122(H) - 111(H)  BUN 6 - 20 mg/dL 32(H) - 32(H)  Creatinine 0.61 - 1.24 mg/dL 1.70(H) 1.57(H) 1.50(H)  Sodium 135 - 145 mmol/L 143 - 143  Potassium 3.5 - 5.1 mmol/L 4.3 - 4.2  Chloride 101 - 111 mmol/L 109 - 104  CO2 22 - 32 mmol/L 25 - -  Calcium 8.9 - 10.3 mg/dL 9.2 - -  Total Protein 6.5 - 8.1 g/dL - - -  Total Bilirubin 0.3 - 1.2 mg/dL - - -  Alkaline Phos 38 - 126 U/L - - -  AST 15 - 41 U/L - - -  ALT 17 - 63 U/L - - -   Component     Latest Ref Rng 12/14/2015 12/14/2015        10:50 AM 10:50 AM  Antithrombin Activity     76 - 126 %    Protein C, Total     72 - 160 %    Protein S Total     60 - 150 %    PTT Lupus Anticoagulant     28.0 - 43.0 secs    PTTLA 4:1 Mix     28.0 - 43.0 secs    PTTLA Confirmation     <8.0 secs    DRVVT     <42.9 secs    DRVVT 1:1 Mix     <42.9 secs    Drvvt confirmation     <1.15 Ratio    Lupus Anticoagulant     NOT DETECTED    Beta-2 Glycoprotein I Ab, IgG     <20 G Units    Beta-2-Glycoprotein I IgM     <20 M Units    Beta-2-Glycoprotein I IgA     <20 A Units    Anticardiolipin Ab,IgG,Qn     <23 GPL U/mL    Anticardiolipin Ab,IgM,Qn     <11 MPL U/mL    Anticardiolipin Ab,IgA,Qn     <22 APL U/mL    Result         Interpretation         Reviewer         Protein C-Functional     75 - 133 %    Protein S-Functional     69 - 129 %    Interpretation         Reviewer         WBC     4.0 - 10.3 10e3/uL  5.6  NEUT#     1.5 - 6.5 10e3/uL   3.7  Hemoglobin     13.0 - 17.1 g/dL  11.5 (L)  HCT     37.5 - 51.0 % 34.1 (L) 34.4 (L)  Platelets     140 - 400 10e3/uL  113 (L)  MCV  79.3 - 98.0 fL  113.2 (H)  MCH     27.2 - 33.4 pg  37.8 (H)  MCHC     32.0 - 36.0 g/dL  33.4  RBC     4.20 - 5.82 10e6/uL  3.04 (L)  RDW     11.0 - 14.6 %  14.6  lymph#     0.9 - 3.3 10e3/uL  1.3  MONO#     0.1 - 0.9 10e3/uL  0.5  Eosinophils Absolute     0.0 - 0.5 10e3/uL  0.2  Basophils Absolute     0.0 - 0.1 10e3/uL  0.0  NEUT%     39.0 - 75.0 %  65.7  LYMPH%     14.0 - 49.0 %  23.1  MONO%     0.0 - 14.0 %  8.0  EOS%     0.0 - 7.0 %  2.8  BASO%     0.0 - 2.0 %  0.4  Retic %     0.80 - 1.80 %  3.07 (H)  Retic Ct Abs     34.80 - 93.90 10e3/uL  93.33  Immature Retic Fract     3.00 - 10.60 %  12.80 (H)  Sodium     136 - 145 mEq/L 142   Potassium     3.5 - 5.1 mEq/L 4.7   Chloride     98 - 109 mEq/L 106   CO2     22 - 29 mEq/L 28   Glucose     70 - 140 mg/dl 107   BUN     7.0 - 26.0 mg/dL 21.2   Creatinine     0.7 - 1.3 mg/dL 1.7 (H)   Total Bilirubin     0.20 - 1.20 mg/dL 0.93   Alkaline Phosphatase     40 - 150 U/L 47   AST     5 - 34 U/L 39 (H)   ALT     0 - 55 U/L 29   Total Protein     6.4 - 8.3 g/dL 6.5   Albumin     3.5 - 5.0 g/dL 3.7   Calcium     8.4 - 10.4 mg/dL 9.9   Anion gap     3 - 11 mEq/L 7   EGFR     >90 ml/min/1.73 m2 37 (L)   IgG (Immunoglobin G), Serum     700 - 1600 mg/dL 815   IgA/Immunoglobulin A, Serum     61 - 437 mg/dL 238   IgM, Qn, Serum     15 - 143 mg/dL 50   Total Protein     6.0 - 8.5 g/dL 6.1   Albumin SerPl Elph-Mcnc     2.9 - 4.4 g/dL 3.5   Alpha 1     0.0 - 0.4 g/dL 0.2   Alpha2 Glob SerPl Elph-Mcnc     0.4 - 1.0 g/dL 0.5   B-Globulin SerPl Elph-Mcnc     0.7 - 1.3 g/dL 1.1   Gamma Glob SerPl Elph-Mcnc     0.4 - 1.8 g/dL 0.7   M Protein SerPl Elph-Mcnc     Not Observed g/dL Not Observed   Globulin, Total     2.2 - 3.9 g/dL 2.6   Albumin/Glob SerPl      0.7 - 1.7 1.4   IFE 1      Comment   Please Note (HCV):      Comment   Folate, Hemolysate  Not Estab. ng/mL 512.8   Folate, RBC     >498 ng/mL 1,504   LDH     125 - 245 U/L 153   Haptoglobin     34 - 200 mg/dL 59   Vitamin S15     190 - 946 pg/mL 1,488 (H)   Thiamine     66.5 - 200.0 nmol/L 112.3    Hypercoagulable panel, comprehensive (Order [id number])      Hypercoagulable panel, comprehensive  Status: Finalresult Visible to patient:  MyChart Nextappt: 02/28/2016 at 10:30 AM in Family Medicine ([facility]) Dx:  Personal history of PE (pulmonary emb...       Notes Recorded by Eustaquio Boyden, MD on 01/06/2015 at 2:03 PM Released via mychart     Ref Range 43yr ago    AntiThromb III Func 76 - 126 % 77   Protein C, Total 72 - 160 % 80   Comments: ** Please note change in reference range(s). **   Protein S Total 60 - 150 % 79   PTT Lupus Anticoagulant 28.0 - 43.0 secs 30.4   PTTLA 4:1 Mix 28.0 - 43.0 secs NOT APPL   PTTLA Confirmation <8.0 secs NOT APPL   DRVVT <42.9 secs 35.8   DRVVT 1:1 Mix <42.9 secs NOT APPL   Drvvt confirmation <1.15 Ratio NOT APPL   Lupus Anticoagulant NOT DETECTED  NOT DETECTED   Beta-2 Glyco I IgG <20 G Units 7   Beta-2-Glycoprotein I IgM <20 M Units 6   Beta-2-Glycoprotein I IgA <20 A Units 6   Anticardiolipin IgG <23 GPL U/mL 7   Anticardiolipin IgM <11 MPL U/mL 2   Anticardiolipin IgA <22 APL U/mL 2   Comments:   Reference Range: Cardiolipin IgG   Normal         <23   Low Positive (+)    23-35   Moderate Positive (+)  36-50   High Positive (+)    >50    Reference Range: Cardiolipin IgM   Normal         <11   Low Positive (+)    11-20   Moderate Positive (+)  21-30   High Positive (+)    >30    Reference Range: Cardiolipin IgA   Normal         <22   Low Positive (+)    22-35   Moderate Positive (+)  36-45   High  Positive (+)    >45       Result  REPORT   Comments: FACTOR V LEIDEN (R506Q) MUTATION NOT DETECTED   Interpretation  REPORT   Comments: This individual is negative (normal) for the Factor V  Leiden (R506Q) mutation in the Factor V gene.  Increased risk of thrombophilia can be caused by a  variety of genetic and non-genetic factors not  screened for by this assay.     Reviewer  REPORT   Comments: Delton Coombes, Ph.D.,FACMG,  Director, Molecular Genetics  SUPPLEMENTAL INFORMATION  The Factor V Leiden (R506Q) mutation [[id number]:c.  1601G>A (p.R534Q)] in the Factor V gene is one of the  most common causes of inherited thrombophilia. This  mutation causes resistance to degradation of activated  Factor V protein by activated Protein C (APC).  The Factor V Leiden (R506Q) mutation is detected by  amplification of the selected region of Factor V gene  by polymerase chain reaction (PCR) and fluorescent  probe hybridization to the targeted region, followed by  melting curve analysis  with a real time PCR system.  Although rare, false positive or false negative results  may occur. All results should be interpreted in  context of clinical findings, relevant history, and  other laboratory data.  Health care providers, please contact your local  [facility] genetic counselor or call  [phone] [phone]) for assistance with  interpretation of these results.  This test was developed and its analytical performance  characteristics have been determined by [employer], [place], [place].  It has not been cleared or approved by the U.S.  Food and Drug Administration. The FDA has determined  that such clearance or approval is not necessary.  This assay has been validated pursuant to [employer] and is used for clinical purposes.     Protein C Activity 75 - 133 % 128   Protein S Activity 69 - 129 % 88   Result  REPORT     Comments: THE G20210A MUTATION NOT DETECTED   Interpretation  REPORT   Comments: This individual is negative (normal) for the G20210A  mutation in the Prothrombin/Factor II gene. Increased  risk of thrombophilia can be caused by a variety of  genetic and non-genetic factors not screened for by  this assay.     Reviewer  REPORT   Comments: Forestine Na, Ph.D.,FACMG,  Director, Molecular Genetics  SUPPLEMENTAL INFORMATION  The G20210A mutation [[id number]:g.21538G>A (c.*97G>A)]  in the Prothrombin/Factor II gene is the second most  common inherited risk factor for thrombosis occurring  in approximately 2% of Caucasians. Presence of the  mutation is associated with an elevation of prothrombin  levels to about 30% above normal in heterozygotes and  to 70% above normal in homozygotes.  The G20210A mutation is detected by amplification of  the selected region of Factor II gene by polymerase  chain reaction (PCR) and fluorescent probe  hybridization to the targeted region, followed by  melting curve analysis with a real time PCR system.  Although rare, false positive or false negative results  may occur. All results should be interpreted in  context of clinical findings, relevant history, and  other laboratory data.  Health care providers, please contact your local  [place] genetic counselor or call  [phone] [phone]) for assistance with  interpretation of these results.  This test was developed and its analytical performance  characteristics have been determined by [employer], [place], [place].  It has not been cleared or approved by the U.S.  Food and Drug Administration. The FDA has determined  that such clearance or approval is not necessary.  This assay has been validated pursuant to [employer] and is used for clinical purposes.     Resulting Agency [facility]     Narrative     Performed at: [address]        [place], [place] [address]         RADIOGRAPHIC STUDIES: I have personally reviewed the radiological images as listed and agreed with the findings in the report. No results found.  ASSESSMENT & PLAN:   82 year old gentleman with  #1 macrocytic anemia - chronic. Likely multifactorial. Likely from his chronic kidney disease. Macrocytosis with normal RDW likely related to some element of MDS. (Peripheral blood smear with some increasing acanthocytes and pelgeroid neutrophils suggestive of MDS) Myeloma panel with no M protein LDH and haptoglobin within normal limits suggesting against any evidence of hemolysis.  No significant reticulocytosis to suggest active bleeding at this time.  #2 mild thrombocytopenia - no bleeding No evidence of platelet clumping on his peripheral blood smear. Could be from some element of MDS. No indication for treatment at this time. Plan -Patient likely has some element of MDS and anemia of chronic disease due to his chronic kidney disease. -If the patient's anemia were to get worse would consider based on goals of care a bone marrow biopsy. Not currently indicated. -We'll empirically treat the patient with B-complex. -Would recommend treating with oral iron to keep the ferritin more than 100 in the setting of anemia of chronic to kidney disease especially if hgb<10 and ESA's contemplated. -Avoid NSAIDs or other medications causing thrombocytopenia. -Would need to hold anticoagulation if his platelet counts are less than 50,000.  #3 patient with a remote history of extensive pulmonary embolism and DVT in 1992. He has been on anticoagulation for more than 20 yrs. Hypercoagulable workup by his primary care physician April 2016 was unrevealing. He has had elevated factor VIII levels - which are not indicative of risk one way or another and were likely reactively increased. Patient has family  history of DVT and PE. And his first event was significant an unprovoked -both of these factors would justify ongoing anticoagulation if no contraindications exist. Unclear that his recent CVA was related to  A hypercoagulable state. Plan -Had extensive discussion with the patient. In the absence of bleeding he chooses to continue with therapeutic anticoagulation with Coumadin at this time . -He follows with cardiology and shall do so to r/o a cardioembolic source of CVA . (wasnt able to get TEE due to esophageal stricture) -Would hold anticoagulation of his platelets were below 50,000 or if that is any concern with bleeding .  Continue follow-up with primary care physician/coumadin clinic for ongoing INR monitoring on Coumadin . RTC with Dr. Irene Limbo in 3 months with repeat CBC, CMP, ferritin.   . Orders Placed This Encounter  Procedures  . CBC & Diff and Retic    Standing Status: Future     Number of Occurrences: 1     Standing Expiration Date: 01/17/2017  . Comprehensive metabolic panel    Standing Status: Future     Number of Occurrences: 1     Standing Expiration Date: 12/13/2016  . Smear    Standing Status: Future     Number of Occurrences: 1     Standing Expiration Date: 12/13/2016  . Lactate dehydrogenase (LDH)    Standing Status: Future     Number of Occurrences: 1     Standing Expiration Date: 12/13/2016  . Haptoglobin    Standing Status: Future     Number of Occurrences: 1     Standing Expiration Date: 12/13/2016  . Vitamin B12    Standing Status: Future     Number of Occurrences: 1     Standing Expiration Date: 12/13/2016  . Folate RBC    Standing Status: Future     Number of Occurrences: 1     Standing Expiration Date: 12/13/2016  . Vitamin B1    Standing Status: Future     Number of Occurrences: 1     Standing Expiration Date: 12/13/2016  . Multiple Myeloma Panel (SPEP&IFE w/QIG)    Standing Status: Future     Number of Occurrences: 1     Standing Expiration Date: 12/13/2016     All of the patients questions were answered with apparent satisfaction. The patient knows to  call the clinic with any problems, questions or concerns.  I spent 60 minutes counseling the patient face to face. The total time spent in the appointment was 60 minutes and more than 50% was on counseling and direct patient cares.    Sullivan Lone MD [place] AAHIVMS [facility] Hematology/Oncology Physician [facility]  (Office):       [phone] (Work cell):  [phone] (Fax):           [phone]  12/14/2015 9:21 AM

## 2015-12-14 NOTE — Telephone Encounter (Signed)
per po fto sch pt appt-gave pt copy of avs-sent back to lab °

## 2015-12-15 LAB — HAPTOGLOBIN: Haptoglobin: 59 mg/dL (ref 34–200)

## 2015-12-15 LAB — VITAMIN B12: Vitamin B12: 1488 pg/mL — ABNORMAL HIGH (ref 211–946)

## 2015-12-17 ENCOUNTER — Other Ambulatory Visit: Payer: Self-pay | Admitting: *Deleted

## 2015-12-17 LAB — FOLATE RBC
FOLATE, HEMOLYSATE: 512.8 ng/mL
Folate, RBC: 1504 ng/mL (ref >498)
Hematocrit: 34.1 % — ABNORMAL LOW (ref 37.5–51.0)

## 2015-12-17 LAB — VITAMIN B1: Thiamine: 112.3 nmol/L (ref 66.5–200.0)

## 2015-12-17 MED ORDER — SIMVASTATIN 40 MG PO TABS: 40.0000 mg | ORAL_TABLET | Freq: Every day | ORAL | Status: DC

## 2015-12-17 NOTE — Telephone Encounter (Signed)
Noted. No change indicated.

## 2015-12-18 ENCOUNTER — Encounter: Payer: Self-pay | Admitting: Family Medicine

## 2015-12-18 ENCOUNTER — Ambulatory Visit (INDEPENDENT_AMBULATORY_CARE_PROVIDER_SITE_OTHER): Payer: Medicare Other | Admitting: Family Medicine

## 2015-12-18 VITALS — BP 102/50 | HR 90 | Temp 98.1°F | Ht 66.0 in | Wt 168.0 lb

## 2015-12-18 DIAGNOSIS — N183 Chronic kidney disease, stage 3 unspecified: Secondary | ICD-10-CM

## 2015-12-18 DIAGNOSIS — F0390 Unspecified dementia without behavioral disturbance: Secondary | ICD-10-CM

## 2015-12-18 DIAGNOSIS — I639 Cerebral infarction, unspecified: Secondary | ICD-10-CM

## 2015-12-18 DIAGNOSIS — G250 Essential tremor: Secondary | ICD-10-CM | POA: Diagnosis not present

## 2015-12-18 DIAGNOSIS — R791 Abnormal coagulation profile: Secondary | ICD-10-CM

## 2015-12-18 DIAGNOSIS — D696 Thrombocytopenia, unspecified: Secondary | ICD-10-CM | POA: Diagnosis not present

## 2015-12-18 DIAGNOSIS — R21 Rash and other nonspecific skin eruption: Secondary | ICD-10-CM

## 2015-12-18 DIAGNOSIS — Z0000 Encounter for general adult medical examination without abnormal findings: Secondary | ICD-10-CM | POA: Diagnosis not present

## 2015-12-18 DIAGNOSIS — D539 Nutritional anemia, unspecified: Secondary | ICD-10-CM

## 2015-12-18 DIAGNOSIS — F331 Major depressive disorder, recurrent, moderate: Secondary | ICD-10-CM

## 2015-12-18 DIAGNOSIS — R109 Unspecified abdominal pain: Secondary | ICD-10-CM | POA: Diagnosis not present

## 2015-12-18 DIAGNOSIS — E785 Hyperlipidemia, unspecified: Secondary | ICD-10-CM

## 2015-12-18 DIAGNOSIS — Z7189 Other specified counseling: Secondary | ICD-10-CM | POA: Diagnosis not present

## 2015-12-18 LAB — POCT URINALYSIS DIPSTICK
BILIRUBIN UA: NEGATIVE
Glucose, UA: NEGATIVE
Ketones, UA: NEGATIVE
NITRITE UA: NEGATIVE
PH UA: 6
Protein, UA: NEGATIVE
SPEC GRAV UA: 1.025
UROBILINOGEN UA: 0.2

## 2015-12-18 LAB — MULTIPLE MYELOMA PANEL, SERUM
ALBUMIN/GLOB SERPL: 1.4 (ref 0.7–1.7)
ALPHA2 GLOB SERPL ELPH-MCNC: 0.5 g/dL (ref 0.4–1.0)
Albumin SerPl Elph-Mcnc: 3.5 g/dL (ref 2.9–4.4)
Alpha 1: 0.2 g/dL (ref 0.0–0.4)
B-GLOBULIN SERPL ELPH-MCNC: 1.1 g/dL (ref 0.7–1.3)
GAMMA GLOB SERPL ELPH-MCNC: 0.7 g/dL (ref 0.4–1.8)
GLOBULIN, TOTAL: 2.6 g/dL (ref 2.2–3.9)
IGG (IMMUNOGLOBIN G), SERUM: 815 mg/dL (ref 700–1600)
IgA, Qn, Serum: 238 mg/dL (ref 61–437)
IgM, Qn, Serum: 50 mg/dL (ref 15–143)
Total Protein: 6.1 g/dL (ref 6.0–8.5)

## 2015-12-18 LAB — URINALYSIS, MICROSCOPIC ONLY

## 2015-12-18 MED ORDER — VENLAFAXINE HCL 75 MG PO TABS: 75.0000 mg | ORAL_TABLET | Freq: Two times a day (BID) | ORAL | Status: DC

## 2015-12-18 NOTE — Assessment & Plan Note (Signed)
Chronic, stable. Trig remain elevated but LDL at goal. Continue statin and fibrate

## 2015-12-18 NOTE — Assessment & Plan Note (Addendum)
Biopsy proven drug rash Nehemiah Massed) - aricept stopped. Also off lipitor - now on simvastatin. Rash continues. Reassess next visit.

## 2015-12-18 NOTE — Progress Notes (Addendum)
BP 102/50 mmHg  Pulse 90  Temp(Src) 98.1 F (36.7 C) (Oral)  Ht _0  (1.676 m)  Wt 168 lb (76.204 kg)  BMI 27.13 kg/m2  SpO2 96%   CC: medicare wellness visit  Subjective:    Patient ID: [name], male    DOB: 1931/03/26, 82 y.o.   MRN: [id number]  HPI: [name] is a 82 y.o. male presenting on 12/18/2015 for Annual Exam   Saw Dr Irene Limbo last month for thrombocytopenia with macrocytic anemia - blood work and note pending. rec add B complex vitamin.   Recent hospitalization 11/2015 with acute ischemic stroke. Coumadin restarted (elevated factor VIII). zocor started, fibrate continued. Continues outpatient PT at [facility].   Endorses some R lower abd discomfort "achey". worsened after fall last week. Some dysuria over last [redacted] weeks along with urgency and frequency (chronic) despite oxybutynin 45m bid. No hematuria.   Hearing - passed today. Vision deferred - recent eye exam 2+ fall without injury in last year. Latest 2 wks ago.  PHQ9 = 15. He is on effexor 756monce daily for several years.  Doesn't drive.  Preventative: Prostate exam - once elevated to 7s, then dropped back to 1s. Recommended by Dr. WrJeffie Pollocko not recheck unless urinary sxs.  COLONOSCOPY Date: 04/2014 3 polyps, adenomatous, f/u open ended given age (JArdis Hughs Flu shot done  Tdap 2011 pneumovax 2000, prevnar 2015 Shingles shot 2006 Discussed advanced directives - this has been updated - they [place] copy. HCPOA is wife, [name] SmackDoes not want prolonged life support if terminal.  Seat belt use discussed No changing moles on skin. Sees [facility] regularly.   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  Relevant past medical, surgical, family and social history reviewed and updated as indicated. Interim medical history since our last  visit reviewed. Allergies and medications reviewed and updated. Current Outpatient Prescriptions on File Prior to Visit  Medication Sig  . albuterol (PROVENTIL HFA;VENTOLIN HFA) 108 (90 BASE) MCG/ACT inhaler Inhale 2 puffs into the lungs every 6 (six) hours as needed for wheezing or shortness of breath.  . Biotin 2500 MCG CAPS Take 1 capsule by mouth daily.  . Cholecalciferol (VITAMIN D) 2000 UNITS CAPS Take 1 capsule by mouth daily.  . clotrimazole (LOTRIMIN) 1 % cream Apply 1 application topically 2 (two) times daily. (Patient taking differently: Apply 1 application topically 2 (two) times daily as needed. For rash)  . Coenzyme Q10 (COQ-10) 100 MG CAPS Take 1 capsule by mouth daily.   . Cyanocobalamin (B-12 PO) Take 10,000 mcg by mouth every Monday, Wednesday, and Friday.   . dMarland Kitchenxlansoprazole (DEXILANT) 60 MG capsule Take 1 capsule (60 mg total) by mouth daily.  . dMarland Kitchencusate sodium (COLACE) 100 MG capsule Take 100 mg by mouth at bedtime.   . fenofibrate (TRICOR) 145 MG tablet TAKE 1 TABLET DAILY  . fexofenadine (ALLEGRA) 180 MG tablet Take 180 mg by mouth daily.   . gMarland Kitchenbapentin (NEURONTIN) 100 MG capsule Take 2 capsules (200 mg total) by mouth 3 (three) times daily. (Patient taking differently: Take 200-300 mg by mouth 2 (two) times daily. Take 3 capsules in the morning Take 2 capsules in the evening)  . Multiple Vitamins-Minerals (MULTIVITAMIN PO) Take by mouth daily.  . simvastatin (ZOCOR) 40 MG tablet Take 1 tablet (40 mg  total) by mouth daily.  Marland Kitchen warfarin (COUMADIN) 2.5 MG tablet Take as directed by anti-coagulation clinic.   No current facility-administered medications on file prior to visit.    Review of Systems Per HPI unless specifically indicated in ROS section     Objective:    BP 102/50 mmHg  Pulse 90  Temp(Src) 98.1 F (36.7 C) (Oral)  Ht _0  (1.676 m)  Wt 168 lb (76.204 kg)  BMI 27.13 kg/m2  SpO2 96%  Wt Readings from Last 3 Encounters:  12/18/15 168 lb (76.204 kg)    12/14/15 167 lb 9.6 oz (76.023 kg)  11/22/15 169 lb 4 oz (76.771 kg)    Physical Exam Results for orders placed or performed in visit on 12/14/15  TECHNOLOGIST REVIEW  Result Value Ref Range   Technologist Review Few Metas and Myelocytes present. 1% NRBC   CBC & Diff and Retic  Result Value Ref Range   WBC 5.6 4.0 - 10.3 10e3/uL   NEUT# 3.7 1.5 - 6.5 10e3/uL   HGB 11.5 (L) 13.0 - 17.1 g/dL   HCT 34.4 (L) 38.4 - 49.9 %   Platelets 113 (L) 140 - 400 10e3/uL   MCV 113.2 (H) 79.3 - 98.0 fL   MCH 37.8 (H) 27.2 - 33.4 pg   MCHC 33.4 32.0 - 36.0 g/dL   RBC 3.04 (L) 4.20 - 5.82 10e6/uL   RDW 14.6 11.0 - 14.6 %   lymph# 1.3 0.9 - 3.3 10e3/uL   MONO# 0.5 0.1 - 0.9 10e3/uL   Eosinophils Absolute 0.2 0.0 - 0.5 10e3/uL   Basophils Absolute 0.0 0.0 - 0.1 10e3/uL   NEUT% 65.7 39.0 - 75.0 %   LYMPH% 23.1 14.0 - 49.0 %   MONO% 8.0 0.0 - 14.0 %   EOS% 2.8 0.0 - 7.0 %   BASO% 0.4 0.0 - 2.0 %   Retic % 3.07 (H) 0.80 - 1.80 %   Retic Ct Abs 93.33 34.80 - 93.90 10e3/uL   Immature Retic Fract 12.80 (H) 3.00 - 10.60 %  Comprehensive metabolic panel  Result Value Ref Range   Sodium 142 136 - 145 mEq/L   Potassium 4.7 3.5 - 5.1 mEq/L   Chloride 106 98 - 109 mEq/L   CO2 28 22 - 29 mEq/L   Glucose 107 70 - 140 mg/dl   BUN 21.2 7.0 - 26.0 mg/dL   Creatinine 1.7 (H) 0.7 - 1.3 mg/dL   Total Bilirubin 0.93 0.20 - 1.20 mg/dL   Alkaline Phosphatase 47 40 - 150 U/L   AST 39 (H) 5 - 34 U/L   ALT 29 0 - 55 U/L   Total Protein 6.5 6.4 - 8.3 g/dL   Albumin 3.7 3.5 - 5.0 g/dL   Calcium 9.9 8.4 - 10.4 mg/dL   Anion Gap 7 3 - 11 mEq/L   EGFR 37 (L) >90 ml/min/1.73 m2  Smear  Result Value Ref Range   Smear Result Smear Available   Lactate dehydrogenase (LDH)  Result Value Ref Range   LDH 153 125 - 245 U/L  Haptoglobin  Result Value Ref Range   Haptoglobin 59 34 - 200 mg/dL  Vitamin B12  Result Value Ref Range   Vitamin B12 1,488 (H) 211 - 946 pg/mL  Folate RBC  Result Value Ref Range   Folate,  Hemolysate 512.8 Not Estab. ng/mL   Hematocrit 34.1 (L) 37.5 - 51.0 %   Folate, RBC 1,504 >498 ng/mL  Vitamin B1  Result Value Ref Range  Thiamine 112.3 66.5 - 200.0 nmol/L   Lab Results  Component Value Date   CHOL 137 11/13/2015   HDL 13* 11/13/2015   LDLCALC 74 11/13/2015   LDLDIRECT 82.6 11/26/2009   TRIG 248* 11/13/2015   CHOLHDL 10.5 11/13/2015       Assessment & Plan:   Problem List Items Addressed This Visit    Thrombocytopenia ([place])    Appreciate heme care of patient.       CKD (chronic kidney disease) stage 3, GFR 30-59 ml/min    Chronic. Will need meds renally dosed      Essential tremor    Has re established with Dr Jannifer Franklin neurology.       Medicare annual wellness visit, subsequent - Primary    I have personally reviewed the Medicare Annual Wellness questionnaire and have noted 1. The patient's medical and social history 2. Their use of alcohol, tobacco or illicit drugs 3. Their current medications and supplements 4. The patient's functional ability including ADL's, fall risks, home safety risks and hearing or visual impairment. Cognitive function has been assessed and addressed as indicated.  5. Diet and physical activity 6. Evidence for depression or mood disorders The patients weight, height, BMI have been recorded in the chart. I have made referrals, counseling and provided education to the patient based on review of the above and I have provided the pt with a written personalized care plan for preventive services. Provider list updated.. See scanned questionairre as needed for further documentation. Reviewed preventative protocols and updated unless pt declined.       HLD (hyperlipidemia)    Chronic, stable. Trig remain elevated but LDL at goal. Continue statin and fibrate      MDD (major depressive disorder), recurrent episode, moderate (HCC)    Deteriorated - will increase effexor to 63m bid. New dose sent to pharmacy.  PHQ9 = 15/27, very  difficult to function PHQ9 clarified - no SI/HI. Just struggling with sense of burden and relationship with wife. Encouraged consider counseling.       Relevant Medications   venlafaxine (EFFEXOR) 75 MG tablet   Dementia    No longer on aricept. Persistent memory deficits, more short term. Ongoing imbalance, tremor. Has seen neurology for tremor.      Relevant Medications   venlafaxine (EFFEXOR) 75 MG tablet   Macrocytic anemia    Undergoing workup with Dr KIrene Limboagain       Advanced care planning/counseling discussion    Discussed advanced directives - this has been updated - [place]. HCPOA is wife, KPerrin [name] Does not want prolonged life support if terminal.       Skin rash    Biopsy proven drug rash (Nehemiah Massed - aricept stopped. Also off lipitor - now on simvastatin. Rash continues. Reassess next visit.      Acute ischemic stroke (HCC)    Back on coumadin. Not no aspirin.       Elevated factor VIII level    Appreciate heme care.      Right flank pain       Follow up plan: Return in about 3 months (around 03/18/2016), or as needed, for follow up visit.  JRia Bush MD

## 2015-12-18 NOTE — Addendum Note (Signed)
Addended by: Ria Bush on: 12/18/2015 02:18 PM   Modules accepted: Miquel Dunn

## 2015-12-18 NOTE — Assessment & Plan Note (Signed)

## 2015-12-18 NOTE — Assessment & Plan Note (Signed)
Has re established with Dr Jannifer Franklin neurology.

## 2015-12-18 NOTE — Addendum Note (Signed)
Addended by: Marchia Bond on: 12/18/2015 02:27 PM   Modules accepted: Orders, SmartSet

## 2015-12-18 NOTE — Patient Instructions (Addendum)
Urinalysis today. Right sided pain may have been a muscle strain. If not improving let us know to schedule ultrasound. Ok to stop oxybutynin.  Increase effexor to 75mg  twice daily.  Bring [place] copy of living will to update your chart. Good to see you today, call us with questions. Return in 3-4 months for follow up visit.  Health Maintenance, Male A healthy lifestyle and preventative care can promote health and wellness.  Maintain regular health, dental, and eye exams.  Eat a healthy diet. Foods like vegetables, fruits, whole grains, low-fat dairy products, and lean protein foods contain the nutrients you need and are low in calories. Decrease your intake of foods high in solid fats, added sugars, and salt. Get information about a proper diet from your health care provider, if necessary.  Regular physical exercise is one of the most important things you can do for your health. Most adults should get at least 150 minutes of moderate-intensity exercise (any activity that increases your heart rate and causes you to sweat) each week. In addition, most adults need muscle-strengthening exercises on 2 or more days a week.   Maintain a healthy weight. The body mass index (BMI) is a screening tool to identify possible weight problems. It provides an estimate of body fat based on height and weight. Your health care provider can find your BMI and can help you achieve or maintain a healthy weight. For males 20 years and older:  A BMI below 18.5 is considered underweight.  A BMI of 18.5 to 24.9 is normal.  A BMI of 25 to 29.9 is considered overweight.  A BMI of 30 and above is considered obese.  Maintain normal blood lipids and cholesterol by exercising and minimizing your intake of saturated fat. Eat a balanced diet with plenty of fruits and vegetables. Blood tests for lipids and cholesterol should begin at age 71 and be repeated every 5 years. If your lipid or cholesterol levels are high, you are over  age 42, or you are at high risk for heart disease, you may need your cholesterol levels checked more frequently.Ongoing high lipid and cholesterol levels should be treated with medicines if diet and exercise are not working.  If you smoke, find out from your health care provider how to quit. If you do not use tobacco, do not start.  Lung cancer screening is recommended for adults aged 15-80 years who are at high risk for developing lung cancer because of a history of smoking. A yearly low-dose CT scan of the lungs is recommended for people who have at least a 30-pack-year history of smoking and are current smokers or have quit within the past 15 years. A pack year of smoking is smoking an average of 1 pack of cigarettes a day for 1 year (for example, a 30-pack-year history of smoking could mean smoking 1 pack a day for 30 years or 2 packs a day for 15 years). Yearly screening should continue until the smoker has stopped smoking for at least 15 years. Yearly screening should be stopped for people who develop a health problem that would prevent them from having lung cancer treatment.  If you choose to drink alcohol, do not have more than 2 drinks per day. One drink is considered to be 12 oz (360 mL) of beer, 5 oz (150 mL) of wine, or 1.5 oz (45 mL) of liquor.  Avoid the use of street drugs. Do not share needles with anyone. Ask for help if you need support  or instructions about stopping the use of drugs.  High blood pressure causes heart disease and increases the risk of stroke. High blood pressure is more likely to develop in:  People who have blood pressure in the end of the normal range (100-139/85-89 mm Hg).  People who are overweight or obese.  People who are African American.  If you are 65-74 years of age, have your blood pressure checked every 3-5 years. If you are 45 years of age or older, have your blood pressure checked every year. You should have your blood pressure measured twice--once  when you are at a hospital or clinic, and once when you are not at a hospital or clinic. Record the average of the two measurements. To check your blood pressure when you are not at a hospital or clinic, you can use:  An automated blood pressure machine at a pharmacy.  A home blood pressure monitor.  If you are 39-24 years old, ask your health care provider if you should take aspirin to prevent heart disease.  Diabetes screening involves taking a blood sample to check your fasting blood sugar level. This should be done once every 3 years after age 48 if you are at a normal weight and without risk factors for diabetes. Testing should be considered at a younger age or be carried out more frequently if you are overweight and have at least 1 risk factor for diabetes.  Colorectal cancer can be detected and often prevented. Most routine colorectal cancer screening begins at the age of 84 and continues through age 51. However, your health care provider may recommend screening at an earlier age if you have risk factors for colon cancer. On a yearly basis, your health care provider may provide home test kits to check for hidden blood in the stool. A small camera at the end of a tube may be used to directly examine the colon (sigmoidoscopy or colonoscopy) to detect the earliest forms of colorectal cancer. Talk to your health care provider about this at age 75 when routine screening begins. A direct exam of the colon should be repeated every 5-10 years through age 53, unless early forms of precancerous polyps or small growths are found.  People who are at an increased risk for hepatitis B should be screened for this virus. You are considered at high risk for hepatitis B if:  You were born in a country where hepatitis B occurs often. Talk with your health care provider about which countries are considered high risk.  Your parents were born in a high-risk country and you have not received a shot to protect  against hepatitis B (hepatitis B vaccine).  You have HIV or AIDS.  You use needles to inject street drugs.  You live with, or have sex with, someone who has hepatitis B.  You are a man who has sex with other men (MSM).  You get hemodialysis treatment.  You take certain medicines for conditions like cancer, organ transplantation, and autoimmune conditions.  Hepatitis C blood testing is recommended for all people born from [birth year] through 1963 and any individual with known risk factors for hepatitis C.  Healthy men should no longer receive prostate-specific antigen (PSA) blood tests as part of routine cancer screening. Talk to your health care provider about prostate cancer screening.  Testicular cancer screening is not recommended for adolescents or adult males who have no symptoms. Screening includes self-exam, a health care provider exam, and other screening tests. Consult with your health  care provider about any symptoms you have or any concerns you have about testicular cancer.  Practice safe sex. Use condoms and avoid high-risk sexual practices to reduce the spread of sexually transmitted infections (STIs).  You should be screened for STIs, including gonorrhea and chlamydia if:  You are sexually active and are younger than 24 years.  You are older than 24 years, and your health care provider tells you that you are at risk for this type of infection.  Your sexual activity has changed since you were last screened, and you are at an increased risk for chlamydia or gonorrhea. Ask your health care provider if you are at risk.  If you are at risk of being infected with HIV, it is recommended that you take a prescription medicine daily to prevent HIV infection. This is called pre-exposure prophylaxis (PrEP). You are considered at risk if:  You are a man who has sex with other men (MSM).  You are a heterosexual man who is sexually active with multiple partners.  You take drugs by  injection.  You are sexually active with a partner who has HIV.  Talk with your health care provider about whether you are at high risk of being infected with HIV. If you choose to begin PrEP, you should first be tested for HIV. You should then be tested every 3 months for as long as you are taking PrEP.  Use sunscreen. Apply sunscreen liberally and repeatedly throughout the day. You should seek shade when your shadow is shorter than you. Protect yourself by wearing long sleeves, pants, a wide-brimmed hat, and sunglasses year round whenever you are outdoors.  Tell your health care provider of new moles or changes in moles, especially if there is a change in shape or color. Also, tell your health care provider if a mole is larger than the size of a pencil eraser.  A one-time screening for abdominal aortic aneurysm (AAA) and surgical repair of large AAAs by ultrasound is recommended for men aged 58-75 years who are current or former smokers.  Stay current with your vaccines (immunizations).   This information is not intended to replace advice given to you by your health care provider. Make sure you discuss any questions you have with your health care provider.   Document Released: 02/21/2008 Document Revised: 09/15/2014 Document Reviewed: 01/20/2011 Elsevier Interactive Patient Education [employer].

## 2015-12-18 NOTE — Assessment & Plan Note (Signed)
Appreciate heme care of patient. 

## 2015-12-18 NOTE — Assessment & Plan Note (Signed)
Discussed advanced directives - this has been updated - they will bring [place] a copy. HCPOA is wife, [name]. Does not want prolonged life support if terminal.

## 2015-12-18 NOTE — Progress Notes (Signed)
Pre visit review using our clinic review tool, if applicable. No additional management support is needed unless otherwise documented below in the visit note.  I did the hearing screen on the patient but it is difficult for him to move around and to stand so I didn't do the vision exam.  Patient has seen his Ophthalmologist within the year.  Mike Craze, CMA

## 2015-12-18 NOTE — Assessment & Plan Note (Signed)
Chronic. Will need meds renally dosed

## 2015-12-18 NOTE — Assessment & Plan Note (Addendum)
Deteriorated - will increase effexor to 75mg  bid. New dose sent to pharmacy.  PHQ9 = 15/27, very difficult to function PHQ9 clarified - no SI/HI. Just struggling with sense of burden and relationship with wife. Encouraged consider counseling.

## 2015-12-18 NOTE — Assessment & Plan Note (Addendum)
No longer on aricept. Persistent memory deficits, more short term. Ongoing imbalance, tremor. Has seen neurology for tremor.

## 2015-12-18 NOTE — Assessment & Plan Note (Signed)
Undergoing workup with Dr Irene Limbo again

## 2015-12-18 NOTE — Assessment & Plan Note (Signed)
Appreciate heme care.  

## 2015-12-18 NOTE — Assessment & Plan Note (Signed)
Back on coumadin. Not no aspirin.

## 2015-12-20 ENCOUNTER — Ambulatory Visit (INDEPENDENT_AMBULATORY_CARE_PROVIDER_SITE_OTHER): Payer: Medicare Other | Admitting: *Deleted

## 2015-12-20 DIAGNOSIS — Z5181 Encounter for therapeutic drug level monitoring: Secondary | ICD-10-CM

## 2015-12-20 DIAGNOSIS — I639 Cerebral infarction, unspecified: Secondary | ICD-10-CM

## 2015-12-20 LAB — POCT INR: INR: 2.1

## 2015-12-20 NOTE — Progress Notes (Signed)
Pre visit review using our clinic review tool, if applicable. No additional management support is needed unless otherwise documented below in the visit note. 

## 2015-12-21 LAB — URINE CULTURE: Colony Count: 40000

## 2015-12-22 ENCOUNTER — Other Ambulatory Visit: Payer: Self-pay | Admitting: Family Medicine

## 2015-12-22 MED ORDER — NITROFURANTOIN MONOHYD MACRO 100 MG PO CAPS: 100.0000 mg | ORAL_CAPSULE | Freq: Two times a day (BID) | ORAL | Status: DC

## 2016-01-09 ENCOUNTER — Telehealth: Payer: Self-pay

## 2016-01-09 NOTE — Telephone Encounter (Signed)
Mrs Mccowin request refill for all meds; advised Mrs Meitz I needed the names of the meds requesting; Mrs Schlesser is walking the dog and she will cb with names of meds needing refills.

## 2016-01-10 ENCOUNTER — Ambulatory Visit: Payer: Medicare Other

## 2016-01-10 ENCOUNTER — Other Ambulatory Visit: Payer: Self-pay | Admitting: *Deleted

## 2016-01-10 ENCOUNTER — Ambulatory Visit (INDEPENDENT_AMBULATORY_CARE_PROVIDER_SITE_OTHER): Payer: Medicare Other | Admitting: *Deleted

## 2016-01-10 DIAGNOSIS — Z5181 Encounter for therapeutic drug level monitoring: Secondary | ICD-10-CM

## 2016-01-10 DIAGNOSIS — I639 Cerebral infarction, unspecified: Secondary | ICD-10-CM | POA: Diagnosis not present

## 2016-01-10 LAB — POCT INR: INR: 1.6

## 2016-01-10 MED ORDER — ALBUTEROL SULFATE HFA 108 (90 BASE) MCG/ACT IN AERS: 2.0000 | INHALATION_SPRAY | Freq: Four times a day (QID) | RESPIRATORY_TRACT | Status: AC | PRN

## 2016-01-10 MED ORDER — FENOFIBRATE 145 MG PO TABS: 145.0000 mg | ORAL_TABLET | Freq: Every day | ORAL | Status: DC

## 2016-01-10 MED ORDER — SIMVASTATIN 40 MG PO TABS: 40.0000 mg | ORAL_TABLET | Freq: Every day | ORAL | Status: DC

## 2016-01-10 MED ORDER — DEXLANSOPRAZOLE 60 MG PO CPDR: 1.0000 | DELAYED_RELEASE_CAPSULE | Freq: Every day | ORAL | Status: DC

## 2016-01-10 MED ORDER — VENLAFAXINE HCL 75 MG PO TABS: 75.0000 mg | ORAL_TABLET | Freq: Two times a day (BID) | ORAL | Status: DC

## 2016-01-10 NOTE — Progress Notes (Signed)
Pre visit review using our clinic review tool, if applicable. No additional management support is needed unless otherwise documented below in the visit note. 

## 2016-01-10 NOTE — Progress Notes (Signed)
INR is sub therapeutic today.  Patient denies any missed doses, new medications, or diet changes.  Will boost with 2 tablets today, then increase dose to 1 tablet daily except 1.5 tablets on Mondays and Fridays.  Patient encouraged to avoid greens for 3-4 days, then to remain consistent.  Will recheck in 3 weeks and make additional adjustments at that time if needed.

## 2016-01-30 ENCOUNTER — Encounter: Payer: Self-pay | Admitting: Neurology

## 2016-01-30 ENCOUNTER — Ambulatory Visit (INDEPENDENT_AMBULATORY_CARE_PROVIDER_SITE_OTHER): Payer: Medicare Other | Admitting: Neurology

## 2016-01-30 VITALS — BP 90/59 | HR 72 | Ht 66.0 in | Wt 171.2 lb

## 2016-01-30 DIAGNOSIS — I639 Cerebral infarction, unspecified: Secondary | ICD-10-CM

## 2016-01-30 DIAGNOSIS — I63139 Cerebral infarction due to embolism of unspecified carotid artery: Secondary | ICD-10-CM | POA: Diagnosis not present

## 2016-01-30 NOTE — Patient Instructions (Signed)
I had a long d/w patient and his wife about his recent stroke, risk for recurrent stroke/TIAs, personally independently reviewed imaging studies and stroke evaluation results and answered questions.Continue warfarin daily  for secondary stroke prevention  for now until follow-up visit with hematologist and if warfarin long-term is not necessary switch to aspirin 325 mg daily and maintain strict control of hypertension with blood pressure goal below 130/90, diabetes with hemoglobin A1c goal below 6.5% and lipids with LDL cholesterol goal below 70 mg/dL. I also advised the patient to eat a healthy diet with plenty of whole grains, cereals, fruits and vegetables, exercise regularly and maintain ideal body weight. He was advised to use his walker at all times and fall and safety precautions. He will follow-up in the future with Dr. Jannifer Franklin his neurologist for his tremors as well as strokes and no follow-up appointment with me is necessary.   Stroke Prevention Some medical conditions and behaviors are associated with an increased chance of having a stroke. You may prevent a stroke by making healthy choices and managing medical conditions. HOW CAN I REDUCE MY RISK OF HAVING A STROKE?   Stay physically active. Get at least 30 minutes of activity on most or all days.  Do not smoke. It may also be helpful to avoid exposure to secondhand smoke.  Limit alcohol use. Moderate alcohol use is considered to be:  No more than 2 drinks per day for men.  No more than 1 drink per day for nonpregnant women.  Eat healthy foods. This involves:  Eating 5 or more servings of fruits and vegetables a day.  Making dietary changes that address high blood pressure (hypertension), high cholesterol, diabetes, or obesity.  Manage your cholesterol levels.  Making food choices that are high in fiber and low in saturated fat, trans fat, and cholesterol may control cholesterol levels.  Take any prescribed medicines to control  cholesterol as directed by your health care provider.  Manage your diabetes.  Controlling your carbohydrate and sugar intake is recommended to manage diabetes.  Take any prescribed medicines to control diabetes as directed by your health care provider.  Control your hypertension.  Making food choices that are low in salt (sodium), saturated fat, trans fat, and cholesterol is recommended to manage hypertension.  Ask your health care provider if you need treatment to lower your blood pressure. Take any prescribed medicines to control hypertension as directed by your health care provider.  If you are 30-62 years of age, have your blood pressure checked every 3-5 years. If you are 34 years of age or older, have your blood pressure checked every year.  Maintain a healthy weight.  Reducing calorie intake and making food choices that are low in sodium, saturated fat, trans fat, and cholesterol are recommended to manage weight.  Stop drug abuse.  Avoid taking birth control pills.  Talk to your health care provider about the risks of taking birth control pills if you are over 59 years old, smoke, get migraines, or have ever had a blood clot.  Get evaluated for sleep disorders (sleep apnea).  Talk to your health care provider about getting a sleep evaluation if you snore a lot or have excessive sleepiness.  Take medicines only as directed by your health care provider.  For some people, aspirin or blood thinners (anticoagulants) are helpful in reducing the risk of forming abnormal blood clots that can lead to stroke. If you have the irregular heart rhythm of atrial fibrillation, you should be  on a blood thinner unless there is a good reason you cannot take them.  Understand all your medicine instructions.  Make sure that other conditions (such as anemia or atherosclerosis) are addressed. SEEK IMMEDIATE MEDICAL CARE IF:   You have sudden weakness or numbness of the face, arm, or leg,  especially on one side of the body.  Your face or eyelid droops to one side.  You have sudden confusion.  You have trouble speaking (aphasia) or understanding.  You have sudden trouble seeing in one or both eyes.  You have sudden trouble walking.  You have dizziness.  You have a loss of balance or coordination.  You have a sudden, severe headache with no known cause.  You have new chest pain or an irregular heartbeat. Any of these symptoms may represent a serious problem that is an emergency. Do not wait to see if the symptoms will go away. Get medical help at once. Call your local emergency services (911 in [place] Do not drive yourself to the hospital.   This information is not intended to replace advice given to you by your health care provider. Make sure you discuss any questions you have with your health care provider.   Document Released: 10/02/2004 Document Revised: 09/15/2014 Document Reviewed: 02/25/2013 Elsevier Interactive Patient Education [employer].

## 2016-01-30 NOTE — Progress Notes (Signed)
[facility] [address] [place]. [place] [address] [phone]       OFFICE FOLLOW-UP NOTE  [name] Date of Birth:  07-03-1931 Medical Record Number:  [id number]  HPI : 82 year male seen today for first follow-up visit following hospital admission for stroke in March 2017 .[name] is an 82 y.o. male with a history of hyperlipidemia, Schatzki's ring, essential tremor, sleep apnea and chronic kidney disease, brought to the emergency room following new onset of speech output difficulty. Patient was last known well at 2 AM today 11/12/2015. When he woke up at 8 AM his wife noticed that his speech was unintelligible. Speech has improved since his arrived in the emergency room. There's no previous history of stroke nor TIA. Patient has been taking aspirin daily. CT scan of his head showed stable atrophy and chronic white matter changes, but no acute abnormality. No weakness or numbness involving face or extremities was experienced. Patient was not administered IV t-PA secondary to deficit resolved. He was admitted for further evaluation and treatment.Marland KitchenMRI scan showed a punctate tiny 4 mm left parietal infarct. MRA of the brain showed no significant intracranial stenosis. Transthoracic echo showed normal ejection fraction without cardiac source of embolism. Carotid ultrasound showed no significant extra cranial stenosis. LDL cholesterol was 74 mg percent. Hemoglobin A1c was 5.7. Patient was found to have elevated factor 8 I level of 310. He had rwas disconti It was unclear whether elevated factor VIII levels were blood clots. Hence he was restarted on anticoagulation and advised to see hematologist as an outpatient. He has seen Dr. Irene [name] who has told him that he does not thnt needs long-term ans called him back for a follow-up visit to make a final decision.patient states his speech recovered completely. He is back to his baseline. He is tolerating warfarin without bleeding but  his INR fluctuates slightly. He states his blood pressure usually tends to run low and today it is 90/59. His tolerating Zocor withoutects. Patient hachronic benign essential tremor and sees Dr. Jannifer [name] in office for that. The tremors are stable.The patient ambulates with a wheeled walker. He states his balance is poor but he is had no falls.   ROS:   14 system review of systems is positive for Trouble swallowing, leg swelling, incontinence of bladder, frequency of urination, bladder urgency, walking difficulty, daytime sleepiness, snoring, rash, itching, easy bruising, anemia, memory loss, tremors confusion, dereased concentration and depression  PMH:  Past Medical History  Diagnosis Date  . GERD (gastroesophageal reflux disease)     h/o PUD  . CAD (coronary artery disease)     cath 2000 30% single vessel, normal nuclear stress test 12/03/2010, no evidence ischemia  . CKD (chronic kidney disease) stage 3, GFR 30-59 ml/min     baseline Cr 1.7  . Depression   . Hx pulmonary embolism 08/1991    negative hypercoagulable panel 12/2014  . History of phlebitis   . Allergic rhinitis   . Urge incontinence   . Elevated PSA     previous-normalized (followed by Dr. Jeffie [name], rec no repeat unless urinary sxs)  . HLD (hyperlipidemia)     hypertriglyceridemia  . Asthma     remote  . Arthritis   . Blood transfusion 1990's  . Schatzki's ring 02/2012    s/p dilation [name])  . History of fracture of right hip   . Hearing loss 06/2012    eval - rec annual exam  . Essential tremor 09/27/2008  reviewed eval by Dr [name] in chart 2011   . Prediabetes   . DISH (diffuse idiopathic skeletal hyperostosis) 03/2013    lumbar spine on xray  . DDD (degenerative disc disease) 03/2013    by CT, diffuse multilevel cervical and lumbar spondylosis  . Osteoporosis 11/2013    DEXA T score -2.7  . Macrocytic anemia 2014    stable B12/folate and periph smear 05/2013, again periph smear 2017 with mature cells, mild  dacrocytosis  . Sleep apnea     no CPAP- sleep apnea "cleared up 15 years ago"  . Dizziness     multifactorial, s/p PT at [facility] 06/2014 with HEP  . History of pyelonephritis 05/2014    hospitalization with sepsis  . Closed C7 fracture ([place]) 11/06/2014  . UTI (urinary tract infection) 05/13/2015  . UTI (urinary tract infection) due to Enterococcus 05/13/2015  . Candidal urethritis in male 06/15/2015  . Stroke [facility])     Social History:  Social History   Social History  . Marital Status: Married    Spouse Name: N/A  . Number of Children: 2  . Years of Education: N/A   Occupational History  . 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 Smoking status: Never Smoker   . Smokeless tobacco: Never Used  . Alcohol Use: No  . Drug Use: No  . Sexual Activity: Not on file   Other Topics Concern  . Not on file   Social History Narrative   Married with 2 children   Husband of [name]    Retired: was principal    Activity: walks dog 2-3 times daily about 63min, frequent stops    Diet: some water, good fruits/vegetables, fish 1x/wk, no sodas.      Advanced directives: [name] is wife, [name]. Does not want prolonged life support if terminal      Patient does not drink caffeine.   Patient is right handed.     Medications:   Current Outpatient Prescriptions on File Prior to Visit  Medication Sig Dispense Refill  . albuterol (PROVENTIL HFA;VENTOLIN HFA) 108 (90 Base) MCG/ACT inhaler Inhale 2 puffs into the lungs every 6 (six) hours as needed for wheezing or shortness of breath. 3 Inhaler 3  . b complex vitamins tablet Take 1 tablet by mouth daily.    . Biotin 2500 MCG CAPS Take 1 capsule by mouth daily.    . Cholecalciferol (VITAMIN D) 2000 UNITS CAPS Take 1 capsule by mouth daily. Reported on 01/30/2016    . clotrimazole (LOTRIMIN) 1 % cream Apply 1 application topically 2 (two) times daily. (Patient taking differently: Apply 1 application topically  2 (two) times daily as needed. For rash) 30 g 0  . Coenzyme Q10 (COQ-10) 100 MG CAPS Take 1 capsule by mouth daily.     . Cyanocobalamin (B-12 PO) Take 10,000 mcg by mouth every Monday, Wednesday, and Friday.     Marland Kitchen dexlansoprazole (DEXILANT) 60 MG capsule Take 1 capsule (60 mg total) by mouth daily. 90 capsule 2  . docusate sodium (COLACE) 100 MG capsule Take 100 mg by mouth at bedtime.     . fenofibrate (TRICOR) 145 MG tablet Take 1 tablet (145 mg total) by mouth daily. 90 tablet 2  . fexofenadine (ALLEGRA) 180 MG tablet Take 180 mg by mouth daily.     Marland Kitchen gabapentin (NEURONTIN) 100 MG capsule Take 2 capsules (200 mg total) by mouth 3 (three) times daily. (Patient taking differently:  Take 200-300 mg by mouth 2 (two) times daily. Take 3 capsules in the morning Take 2 capsules in the evening) 540 capsule 1  . Multiple Vitamins-Minerals (MULTIVITAMIN PO) Take by mouth daily.    . simvastatin (ZOCOR) 40 MG tablet Take 1 tablet (40 mg total) by mouth daily. 90 tablet 2  . venlafaxine (EFFEXOR) 75 MG tablet Take 1 tablet (75 mg total) by mouth 2 (two) times daily with a meal. 180 tablet 2  . warfarin (COUMADIN) 2.5 MG tablet Take as directed by anti-coagulation clinic. 7 tablet 0   No current facility-administered medications on file prior to visit.    Allergies:   Allergies  Allergen Reactions  . Lactose Intolerance (Gi)   . Nizatidine     REACTION: Rash (Axid)  . Sulfadiazine     REACTION: Hives  . Aricept [Donepezil Hcl] Rash  . Lipitor [Atorvastatin] Rash  . Penicillins Rash    Has patient had a PCN reaction causing immediate rash, facial/tongue/throat swelling, SOB or lightheadedness with hypotension: No Has patient had a PCN reaction causing severe rash involving mucus membranes or skin necrosis: No Has patient had a PCN reaction that required hospitalization No Has patient had a PCN reaction occurring within the last 10 years: No If all of the above answers are "NO",  then may  proceed with Cephalosporin use.    Physical Exam General: well developed, well nourished, seated, in no evident distress Head: head normocephalic and atraumatic.  Neck: supple with no carotid or supraclavicular bruits Cardiovascular: regular rate and rhythm, no murmurs Musculoskeletal: no deformity Skin:  no rash/petichiae Vascular:  Normal pulses all extremities Filed Vitals:   01/30/16 1150  BP: 90/59  Pulse: 72   Neurologic Exam Mental Status: Awake and fully alert. Oriented to place and time. Recent and remote memory intact. Attention span, concentration and fund of knowledge appropriate. Mood and affect appropriate.  Cranial Nerves: Fundoscopic exam reveals sharp disc margins. Pupils equal, briskly reactive to light. Extraocular movements full without nystagmus. Visual fields full to confrontation. Hearing intact. Facial sensation intact. Face, tongue, palate moves normally and symmetrically.  Motor: Normal bulk and tone. Normal strength in all tested extremity muscles. Sensory.: intact to touch ,pinprick .position and vibratory sensation.  Coordination: Rapid alternating movements normal in all extremities. Finger-to-nose and heel-to-shin performed accurately bilaterally. Gait and Station: Arises from chair without difficulty. Stance is normal. Gait demonstrates normal stride length and balance . Able to heel, toe and tandem walk without difficulty.  Reflexes: 1+ and symmetric. Toes downgoing.   NIHSS  0 Modified Rankin  0   ASSESSMENT: 82 y.o. male with history of hyperlipidemia, Schatzki's ring, essential tremor, sleep apnea and chronic kidney disease presenting with speech difficulty.In March 2017 due to small punctate left parietal infarct etiology indeterminate but given prior history of PE and elevated factor 8-unclear whether it is acute  phase reactant    PLAN: I had a long d/w patient and his wife about his recent stroke, risk for recurrent stroke/TIAs, personally  independently reviewed imaging studies and stroke evaluation results and answered questions.Continue warfarin daily  for secondary stroke prevention  for now until follow-up visit with hematologist and if warfarin long-term is not necessary switch to aspirin 325 mg daily and maintain strict control of hypertension with blood pressure goal below 130/90, diabetes with hemoglobin A1c goal below 6.5% and lipids with LDL cholesterol goal below 70 mg/dL. I also advised the patient to eat a healthy diet with plenty of whole grains,  cereals, fruits and vegetables, exercise regularly and maintain ideal body weight. He was advised to use his walker at all times and fall and safety precautions. He will follow-up in the future with Dr. Jannifer [name] his neurologist for his tremors as well as strokes and no follow-up appointment with me is necessary Greater than 50% of time during this 25 minute visit was spent on counseling,explanation of diagnosis, planning of further management, discussion with patient and family and coordination of care [name] Contras, MD  [facility] [address] [place], [place] [address]  Phone [phone] Fax [phone] Note: This document was prepared with digital dictation and possible smart phrase technology. Any transcriptional errors that result from this process are unintentional

## 2016-01-31 ENCOUNTER — Ambulatory Visit (INDEPENDENT_AMBULATORY_CARE_PROVIDER_SITE_OTHER): Payer: Medicare Other | Admitting: *Deleted

## 2016-01-31 ENCOUNTER — Ambulatory Visit: Payer: Medicare Other | Admitting: Neurology

## 2016-01-31 DIAGNOSIS — Z5181 Encounter for therapeutic drug level monitoring: Secondary | ICD-10-CM

## 2016-01-31 DIAGNOSIS — I639 Cerebral infarction, unspecified: Secondary | ICD-10-CM

## 2016-01-31 LAB — POCT INR: INR: 2.2

## 2016-01-31 NOTE — Progress Notes (Signed)
Pre visit review using our clinic review tool, if applicable. No additional management support is needed unless otherwise documented below in the visit note. 

## 2016-02-06 ENCOUNTER — Ambulatory Visit (INDEPENDENT_AMBULATORY_CARE_PROVIDER_SITE_OTHER): Payer: Medicare Other | Admitting: Family Medicine

## 2016-02-06 ENCOUNTER — Encounter: Payer: Self-pay | Admitting: Family Medicine

## 2016-02-06 VITALS — BP 106/62 | HR 81 | Temp 98.4°F | Ht 66.0 in | Wt 177.2 lb

## 2016-02-06 DIAGNOSIS — I639 Cerebral infarction, unspecified: Secondary | ICD-10-CM | POA: Diagnosis not present

## 2016-02-06 DIAGNOSIS — Z87440 Personal history of urinary (tract) infections: Secondary | ICD-10-CM

## 2016-02-06 DIAGNOSIS — Z5181 Encounter for therapeutic drug level monitoring: Secondary | ICD-10-CM | POA: Diagnosis not present

## 2016-02-06 DIAGNOSIS — R300 Dysuria: Secondary | ICD-10-CM | POA: Diagnosis not present

## 2016-02-06 LAB — POCT URINALYSIS DIPSTICK
Bilirubin, UA: NEGATIVE
GLUCOSE UA: NEGATIVE
Ketones, UA: NEGATIVE
Leukocytes, UA: NEGATIVE
Nitrite, UA: NEGATIVE
Protein, UA: NEGATIVE
RBC UA: NEGATIVE
UROBILINOGEN UA: 0.2
pH, UA: 6

## 2016-02-06 MED ORDER — CIPROFLOXACIN HCL 500 MG PO TABS: 500.0000 mg | ORAL_TABLET | Freq: Two times a day (BID) | ORAL | Status: DC

## 2016-02-06 NOTE — Patient Instructions (Addendum)
Nice to meet you. We're going to start you on ciprofloxacin to cover for possible prostate infection versus UTI. Please continue to monitor. If you develop worsening back pain, fevers, abdominal pain, blood in your urine, chills, or any new or changing symptoms please seek medical attention.

## 2016-02-06 NOTE — Progress Notes (Signed)
Pre visit review using our clinic review tool, if applicable. No additional management support is needed unless otherwise documented below in the visit note. 

## 2016-02-07 ENCOUNTER — Telehealth: Payer: Self-pay | Admitting: Family Medicine

## 2016-02-07 DIAGNOSIS — R300 Dysuria: Secondary | ICD-10-CM | POA: Insufficient documentation

## 2016-02-07 NOTE — Progress Notes (Signed)
Patient ID: SHENOUDA MOTOLA, male   DOB: 1930-11-30, 82 y.o.   MRN: [id number]  Tommi Rumps, MD Phone: [phone]  [name] is a 82 y.o. male who presents today for same-day visit.  Patient reports several months of dysuria, urinary frequency, and urinary urgency. Has been worse the last several weeks. No hematuria at this time though did have some about a month ago. No abdominal pain. No back pain. No fevers. No chills. No sweats. Notes he has had 3 UTIs in the past year. He does have a history of pyelonephritis and sepsis. Has not seen urology recently. He is on Coumadin. Last INR was 2.2.  PMH: nonsmoker.   ROS see history of present illness  Objective  Physical Exam Filed Vitals:   02/06/16 1602  BP: 106/62  Pulse: 81  Temp: 98.4 F (36.9 C)    BP Readings from Last 3 Encounters:  02/06/16 106/62  01/30/16 90/59  12/18/15 102/50   Wt Readings from Last 3 Encounters:  02/06/16 177 lb 3.2 oz (80.377 kg)  01/30/16 171 lb 3.2 oz (77.656 kg)  12/18/15 168 lb (76.204 kg)    Physical Exam  Constitutional: He is well-developed, well-nourished, and in no distress.  HENT:  Head: Normocephalic and atraumatic.  Right Ear: External ear normal.  Left Ear: External ear normal.  Mouth/Throat: Oropharynx is clear and moist. No oropharyngeal exudate.  Cardiovascular: Normal rate, regular rhythm and normal heart sounds.   Pulmonary/Chest: Effort normal and breath sounds normal.  Abdominal: Soft. Bowel sounds are normal. He exhibits no distension. There is tenderness (minimal suprapubic tenderness). There is no rebound and no guarding.  Some right CVA tenderness  Genitourinary:  Rectum normal, prostate mildly tender though not boggy  Neurological: He is alert.  Skin: Skin is warm and dry. He is not diaphoretic.     Assessment/Plan: Please see individual problem list.  Dysuria Patient with several month history of dysuria and other urinary symptoms worsened  recently. Has been treated for UTIs 3 times in the last year. Prostate exam did reveal a mildly tender prostate. UA was unremarkable. Symptoms could be related to prostatitis or UTI. We will send his urine for culture. We will refer him to urology. We'll start him on ciprofloxacin to cover for possible prostatitis. Given possible interaction of ciprofloxacin and warfarin we will have him return on Friday for an INR check and then INR check in 1 week. Most recent INR was 2.2. We'll need to closely monitor this given the possibility that his INR could go up with the ciprofloxacin. He will continue to monitor. He is given return precautions.    Orders Placed This Encounter  Procedures  . Urine culture  . INR/PT    Standing Status: Future     Number of Occurrences:      Standing Expiration Date: 02/05/2017  . Ambulatory referral to Urology    Referral Priority:  Routine    Referral Type:  Consultation    Referral Reason:  Specialty Services Required    Requested Specialty:  Urology    Number of Visits Requested:  1  . POCT Urinalysis Dipstick    Meds ordered this encounter  Medications  . ciprofloxacin (CIPRO) 500 MG tablet    Sig: Take 1 tablet (500 mg total) by mouth every 12 (twelve) hours.    Dispense:  28 tablet    Refill:  0    Tommi Rumps, MD [place]

## 2016-02-07 NOTE — Assessment & Plan Note (Addendum)
Patient with several month history of dysuria and other urinary symptoms worsened recently. Has been treated for UTIs 3 times in the last year. Prostate exam did reveal a mildly tender prostate. UA was unremarkable. Symptoms could be related to prostatitis or UTI. We will send his urine for culture. We will refer him to urology. We'll start him on ciprofloxacin to cover for possible prostatitis. Given possible interaction of ciprofloxacin and warfarin we will have him return on Friday for an INR check and then INR check in 1 week. Most recent INR was 2.2. We'll need to closely monitor this given the possibility that his INR could go up with the ciprofloxacin. He will continue to monitor. He is given return precautions.

## 2016-02-07 NOTE — Telephone Encounter (Signed)
Lm on pts vm requesting a call back 

## 2016-02-07 NOTE — Telephone Encounter (Signed)
Received note he was started on cipro 500mg  bid for UTI/prostatitis. Recommend cut cipro in half given kidney function (250mg  bid), ok to continue 2 wk course. Agree with recheck coumadin on Friday, dose accordingly.

## 2016-02-08 ENCOUNTER — Other Ambulatory Visit (INDEPENDENT_AMBULATORY_CARE_PROVIDER_SITE_OTHER): Payer: Medicare Other

## 2016-02-08 ENCOUNTER — Other Ambulatory Visit: Payer: Self-pay | Admitting: Family Medicine

## 2016-02-08 DIAGNOSIS — Z5181 Encounter for therapeutic drug level monitoring: Secondary | ICD-10-CM

## 2016-02-08 LAB — PROTIME-INR
INR: 2.1 ratio — ABNORMAL HIGH (ref 0.8–1.0)
Prothrombin Time: 22.4 s — ABNORMAL HIGH (ref 9.6–13.1)

## 2016-02-08 LAB — URINE CULTURE
COLONY COUNT: NO GROWTH
ORGANISM ID, BACTERIA: NO GROWTH

## 2016-02-08 NOTE — Telephone Encounter (Signed)
Message left for patient's wife to return my call.  

## 2016-02-12 NOTE — Telephone Encounter (Signed)
Message left for patient's wife to return my call.  

## 2016-02-15 ENCOUNTER — Other Ambulatory Visit (INDEPENDENT_AMBULATORY_CARE_PROVIDER_SITE_OTHER): Payer: Medicare Other

## 2016-02-15 ENCOUNTER — Other Ambulatory Visit: Payer: Medicare Other

## 2016-02-15 DIAGNOSIS — Z5181 Encounter for therapeutic drug level monitoring: Secondary | ICD-10-CM | POA: Diagnosis not present

## 2016-02-15 LAB — PROTIME-INR
INR: 1.8 ratio — ABNORMAL HIGH (ref 0.8–1.0)
Prothrombin Time: 19.2 s — ABNORMAL HIGH (ref 9.6–13.1)

## 2016-02-18 NOTE — Telephone Encounter (Signed)
Message left for patient's wife to return my call.  

## 2016-02-19 NOTE — Telephone Encounter (Signed)
Finally spoke with patient's wife. Everything had been handled by Dr. Caryl Bis. However, he changed his coumadin to a dose that doesn't make sense to her. Can you confirm this please?  Entered by Leone Haven, MD at 02/18/2016 5:09 PM    Please take 2.5 mg Saturday through Thursday. Take 1.5 mg on Friday. We will need to recheck your INR in 2 weeks. Dr Caryl Bis.   She is questioning if this should be one and half tablets or 1.5 mg's?

## 2016-02-19 NOTE — Telephone Encounter (Signed)
Message left for daughter, Shirlean Mylar to return my call.

## 2016-02-19 NOTE — Telephone Encounter (Signed)
Thank you for the FYI. This makes more sense. It appeared when my nurse spoke with the patient's wife that they reported he had been taking 1.5 mg Mondays and Fridays. Thank you for sorting this out.

## 2016-02-19 NOTE — Telephone Encounter (Signed)
Pt has been taking 2.5mg  coumadin tablets - 1 tablet daily except 1.5 tablets on Mondays and Fridays  Latest INR 1.8 (02/15/2016).  Agree with increase - let's increase to 1 tablet daily (2.5mg ) except 1.5 tablets MWF (3.75mg ). Recheck INR 2 wks.  Ensure done with cipro course.   Will cc Dr Caryl Bis as Juluis Rainier.

## 2016-02-19 NOTE — Telephone Encounter (Signed)
Message left advising patient's wife. Will touch base again to make sure there is no confusion.

## 2016-02-19 NOTE — Telephone Encounter (Signed)
Patients wife notified

## 2016-02-24 ENCOUNTER — Other Ambulatory Visit: Payer: Self-pay | Admitting: Family Medicine

## 2016-02-28 ENCOUNTER — Other Ambulatory Visit (INDEPENDENT_AMBULATORY_CARE_PROVIDER_SITE_OTHER): Payer: Medicare Other

## 2016-02-28 DIAGNOSIS — Z5181 Encounter for therapeutic drug level monitoring: Secondary | ICD-10-CM

## 2016-02-28 DIAGNOSIS — I639 Cerebral infarction, unspecified: Secondary | ICD-10-CM | POA: Diagnosis not present

## 2016-02-28 LAB — POCT INR: INR: 3.3

## 2016-02-29 ENCOUNTER — Telehealth: Payer: Self-pay | Admitting: Family Medicine

## 2016-02-29 NOTE — Telephone Encounter (Signed)
Spouse returned your call

## 2016-03-04 ENCOUNTER — Encounter: Payer: Medicare Other | Admitting: Family Medicine

## 2016-03-06 ENCOUNTER — Other Ambulatory Visit (INDEPENDENT_AMBULATORY_CARE_PROVIDER_SITE_OTHER): Payer: Medicare Other

## 2016-03-06 DIAGNOSIS — Z5181 Encounter for therapeutic drug level monitoring: Secondary | ICD-10-CM

## 2016-03-06 DIAGNOSIS — I639 Cerebral infarction, unspecified: Secondary | ICD-10-CM | POA: Diagnosis not present

## 2016-03-06 LAB — POCT INR: INR: 4.1

## 2016-03-09 ENCOUNTER — Inpatient Hospital Stay (HOSPITAL_COMMUNITY)
Admission: EM | Admit: 2016-03-09 | Discharge: 2016-03-13 | DRG: 481 | Disposition: A | Discharge status: Skilled Nursing Facility | Payer: Medicare Other | Attending: Internal Medicine | Admitting: Internal Medicine

## 2016-03-09 DIAGNOSIS — S199XXA Unspecified injury of neck, initial encounter: Secondary | ICD-10-CM | POA: Diagnosis not present

## 2016-03-09 DIAGNOSIS — G4730 Sleep apnea, unspecified: Secondary | ICD-10-CM | POA: Diagnosis present

## 2016-03-09 DIAGNOSIS — K219 Gastro-esophageal reflux disease without esophagitis: Secondary | ICD-10-CM | POA: Diagnosis present

## 2016-03-09 DIAGNOSIS — W010XXA Fall on same level from slipping, tripping and stumbling without subsequent striking against object, initial encounter: Secondary | ICD-10-CM | POA: Diagnosis present

## 2016-03-09 DIAGNOSIS — Z09 Encounter for follow-up examination after completed treatment for conditions other than malignant neoplasm: Secondary | ICD-10-CM

## 2016-03-09 DIAGNOSIS — E785 Hyperlipidemia, unspecified: Secondary | ICD-10-CM | POA: Diagnosis present

## 2016-03-09 DIAGNOSIS — J9811 Atelectasis: Secondary | ICD-10-CM | POA: Diagnosis not present

## 2016-03-09 DIAGNOSIS — S0990XA Unspecified injury of head, initial encounter: Secondary | ICD-10-CM | POA: Diagnosis not present

## 2016-03-09 DIAGNOSIS — Z7901 Long term (current) use of anticoagulants: Secondary | ICD-10-CM

## 2016-03-09 DIAGNOSIS — Z8711 Personal history of peptic ulcer disease: Secondary | ICD-10-CM

## 2016-03-09 DIAGNOSIS — R509 Fever, unspecified: Secondary | ICD-10-CM

## 2016-03-09 DIAGNOSIS — Y92018 Other place in single-family (private) house as the place of occurrence of the external cause: Secondary | ICD-10-CM

## 2016-03-09 DIAGNOSIS — S72002A Fracture of unspecified part of neck of left femur, initial encounter for closed fracture: Secondary | ICD-10-CM | POA: Diagnosis not present

## 2016-03-09 DIAGNOSIS — Z821 Family history of blindness and visual loss: Secondary | ICD-10-CM

## 2016-03-09 DIAGNOSIS — F329 Major depressive disorder, single episode, unspecified: Secondary | ICD-10-CM | POA: Diagnosis present

## 2016-03-09 DIAGNOSIS — M25562 Pain in left knee: Secondary | ICD-10-CM | POA: Diagnosis not present

## 2016-03-09 DIAGNOSIS — Z01818 Encounter for other preprocedural examination: Secondary | ICD-10-CM

## 2016-03-09 DIAGNOSIS — S8992XA Unspecified injury of left lower leg, initial encounter: Secondary | ICD-10-CM | POA: Diagnosis not present

## 2016-03-09 DIAGNOSIS — I2510 Atherosclerotic heart disease of native coronary artery without angina pectoris: Secondary | ICD-10-CM | POA: Diagnosis present

## 2016-03-09 DIAGNOSIS — Z833 Family history of diabetes mellitus: Secondary | ICD-10-CM

## 2016-03-09 DIAGNOSIS — M25552 Pain in left hip: Secondary | ICD-10-CM | POA: Diagnosis not present

## 2016-03-09 DIAGNOSIS — M810 Age-related osteoporosis without current pathological fracture: Secondary | ICD-10-CM | POA: Diagnosis present

## 2016-03-09 DIAGNOSIS — Z87442 Personal history of urinary calculi: Secondary | ICD-10-CM

## 2016-03-09 DIAGNOSIS — Z811 Family history of alcohol abuse and dependence: Secondary | ICD-10-CM

## 2016-03-09 DIAGNOSIS — Z8673 Personal history of transient ischemic attack (TIA), and cerebral infarction without residual deficits: Secondary | ICD-10-CM | POA: Diagnosis not present

## 2016-03-09 DIAGNOSIS — T148 Other injury of unspecified body region: Secondary | ICD-10-CM | POA: Diagnosis not present

## 2016-03-09 DIAGNOSIS — H9190 Unspecified hearing loss, unspecified ear: Secondary | ICD-10-CM | POA: Diagnosis present

## 2016-03-09 DIAGNOSIS — Z823 Family history of stroke: Secondary | ICD-10-CM

## 2016-03-09 DIAGNOSIS — T148XXA Other injury of unspecified body region, initial encounter: Secondary | ICD-10-CM

## 2016-03-09 DIAGNOSIS — N183 Chronic kidney disease, stage 3 (moderate): Secondary | ICD-10-CM | POA: Diagnosis not present

## 2016-03-09 DIAGNOSIS — Z86711 Personal history of pulmonary embolism: Secondary | ICD-10-CM

## 2016-03-09 DIAGNOSIS — Z809 Family history of malignant neoplasm, unspecified: Secondary | ICD-10-CM

## 2016-03-09 DIAGNOSIS — J45909 Unspecified asthma, uncomplicated: Secondary | ICD-10-CM | POA: Diagnosis present

## 2016-03-09 DIAGNOSIS — M542 Cervicalgia: Secondary | ICD-10-CM | POA: Diagnosis not present

## 2016-03-09 DIAGNOSIS — M79605 Pain in left leg: Secondary | ICD-10-CM | POA: Diagnosis not present

## 2016-03-09 DIAGNOSIS — Z8249 Family history of ischemic heart disease and other diseases of the circulatory system: Secondary | ICD-10-CM

## 2016-03-09 DIAGNOSIS — S79912A Unspecified injury of left hip, initial encounter: Secondary | ICD-10-CM | POA: Diagnosis not present

## 2016-03-09 DIAGNOSIS — E781 Pure hyperglyceridemia: Secondary | ICD-10-CM | POA: Diagnosis present

## 2016-03-09 NOTE — ED Provider Notes (Signed)
CSN: [id number]     Arrival date & time 03/09/16  2346 History  By signing my name below, I, Evelene Croon, attest that this documentation has been prepared under the direction and in the presence of Everlene Balls, MD . Electronically Signed: Evelene Croon, Scribe. 03/10/2016. 12:10 AM.   Chief Complaint  Patient presents with  . Fall   The history is provided by the patient, the spouse and the EMS personnel. No language interpreter was used.     HPI Comments:  [name] is a 82 y.o. male brought in by ambulance, who presents to the Emergency Department s/p fall this evening complaining of moderate, left hip pain following the incident. His pain is exacerbated with movement. Pt states he became tangled up in his walker while ambulating, he fell and struck his head on the couch and then on the carpeted floor. He reports associated pain to his left knee. Pt is currently on coumadin secondary to CVA. He has a h/o previous right hip fracture and  C7 fracture. Pt arrives in c-collar; he received fentanyl with improvement of pain. EMS states oxygen saturation decreased to 90-91% after fentanyl. He was then placed on 1L [place] which improved oxygen saturation to 96%.   Past Medical History  Diagnosis Date  . GERD (gastroesophageal reflux disease)     h/o PUD  . CAD (coronary artery disease)     cath 2000 30% single vessel, normal nuclear stress test 12/03/2010, no evidence ischemia  . CKD (chronic kidney disease) stage 3, GFR 30-59 ml/min     baseline Cr 1.7  . Depression   . Hx pulmonary embolism 08/1991    negative hypercoagulable panel 12/2014  . History of phlebitis   . Allergic rhinitis   . Urge incontinence   . Elevated PSA     previous-normalized (followed by Dr. Jeffie Pollock, rec no repeat unless urinary sxs)  . HLD (hyperlipidemia)     hypertriglyceridemia  . Asthma     remote  . Arthritis   . Blood transfusion 1990's  . Schatzki's ring 02/2012    s/p dilation [name]  . History of  fracture of right hip   . Hearing loss 06/2012    eval - rec annual exam  . Essential tremor 09/27/2008    reviewed eval by Dr Jannifer Franklin in chart 2011   . Prediabetes   . DISH (diffuse idiopathic skeletal hyperostosis) 03/2013    lumbar spine on xray  . DDD (degenerative disc disease) 03/2013    by CT, diffuse multilevel cervical and lumbar spondylosis  . Osteoporosis 11/2013    DEXA T score -2.7  . Macrocytic anemia 2014    stable B12/folate and periph smear 05/2013, again periph smear 2017 with mature cells, mild dacrocytosis  . Sleep apnea     no CPAP- sleep apnea "cleared up 15 years ago"  . Dizziness     multifactorial, s/p PT at [facility] 06/2014 with HEP  . History of pyelonephritis 05/2014    hospitalization with sepsis  . Closed C7 fracture ([place]) 11/06/2014  . UTI (urinary tract infection) 05/13/2015  . UTI (urinary tract infection) due to Enterococcus 05/13/2015  . Candidal urethritis in male 06/15/2015  . Stroke [facility])    Past Surgical History  Procedure Laterality Date  . Tonsillectomy  1965  . Hernia repair  1979    Left  . Cystoscopy  1992    for kidney stones  . Exploratory laparotomy  1996    with incidental appendectomy  .  Appendectomy  1996  . Knee arthroscopy  03/24/02    Right (Dr. Mauri Pole)  . Orif femoral neck fracture w/ dhs  08/03/02    Right (Dr. Mauri Pole)  . Ct abd w & pelvis wo cm  07/2001    Scarring of right lung, stable negative o/w  . Ct abd w & pelvis wo cm  11/2000    ? stones, right LL scarring  . V/q scan  06/1999    negative  . [place]oscopy  1998    [place]  . US echocardiography  12/28/2007    Mild aortic valve calcification EF 55%, basically nml  . Carotid u/s  12/28/2007    nml  . Eeg  11/12/2009    nml  . Mri  11/2009    Head, nml  . Cataract extraction  Jan, March 2011    bilateral  . Colonoscopy  07/2010    5 polyps, adenomatous, rec rpt 3 yrs  . Esophagogastroduodenoscopy  02/2012    dilation of schatzki's ring [name]  . Upper  gastrointestinal endoscopy    . Colonoscopy  04/2014    3 polyps, adenomatous, f/u open ended given age [name]  . Cardiac catheterization  2000    30% one vessel  . Cardiovascular stress test  03/2015    no ischemia, low risk, EF 76%  . Esophagogastroduodenoscopy N/A 11/15/2015    Procedure: ESOPHAGOGASTRODUODENOSCOPY (EGD);  Surgeon: Gatha Mayer, MD;  Location[facility] ENDOSCOPY;  Service: Endoscopy;  Laterality: N/A;   Family History  Problem Relation Age of Onset  . Stroke Mother   . Hypertension Mother   . Cancer Brother     prostate with mets  . Blindness Brother     legally  . Diabetes Brother   . Pulmonary embolism Sister     from shoulder operation  . Alcohol abuse Brother   . Colon cancer Neg Hx   . Esophageal cancer Neg Hx   . Rectal cancer Neg Hx   . Stomach cancer Neg Hx    Social History  Substance Use Topics  . Smoking status: Never Smoker   . Smokeless tobacco: Never Used  . Alcohol Use: No    Review of Systems  10 systems reviewed and all are negative for acute change except as noted in the HPI.   Allergies  Lactose intolerance (gi); Nizatidine; Sulfadiazine; Aricept; Lipitor; and Penicillins  Home Medications   Prior to Admission medications   Medication Sig Start Date End Date Taking? Authorizing Provider  albuterol (PROVENTIL HFA;VENTOLIN HFA) 108 (90 Base) MCG/ACT inhaler Inhale 2 puffs into the lungs every 6 (six) hours as needed for wheezing or shortness of breath. 01/10/16  Yes Ria Bush, MD  b complex vitamins tablet Take 1 tablet by mouth daily.   Yes Historical Provider, MD  Biotin 2500 MCG CAPS Take 1 capsule by mouth daily.   Yes Historical Provider, MD  Cholecalciferol (VITAMIN D) 2000 UNITS CAPS Take 1 capsule by mouth daily. Reported on 01/30/2016   Yes Historical Provider, MD  clotrimazole (LOTRIMIN) 1 % cream Apply 1 application topically 2 (two) times daily. Patient taking differently: Apply 1 application topically 2 (two) times  daily as needed. For rash 05/02/15  Yes Pleas Koch, NP  Coenzyme Q10 (COQ-10) 100 MG CAPS Take 1 capsule by mouth daily.    Yes Historical Provider, MD  Cyanocobalamin (B-12 PO) Take 10,000 mcg by mouth every Monday, Wednesday, and Friday.    Yes Historical Provider, MD  dexlansoprazole (  DEXILANT) 60 MG capsule Take 1 capsule (60 mg total) by mouth daily. 01/10/16  Yes Ria Bush, MD  docusate sodium (COLACE) 100 MG capsule Take 100 mg by mouth at bedtime.    Yes Historical Provider, MD  fenofibrate (TRICOR) 145 MG tablet Take 1 tablet (145 mg total) by mouth daily. 01/10/16  Yes Ria Bush, MD  fexofenadine (ALLEGRA) 180 MG tablet Take 180 mg by mouth daily.    Yes Historical Provider, MD  gabapentin (NEURONTIN) 100 MG capsule Take 2 capsules (200 mg total) by mouth 3 (three) times daily. Patient taking differently: Take 200-300 mg by mouth 2 (two) times daily. Take 3 capsules in the morning Take 2 capsules in the evening 09/21/15  Yes Kathrynn Ducking, MD  Multiple Vitamins-Minerals (MULTIVITAMIN PO) Take 1 tablet by mouth daily.    Yes Historical Provider, MD  simvastatin (ZOCOR) 40 MG tablet Take 1 tablet (40 mg total) by mouth daily. 01/10/16  Yes Ria Bush, MD  venlafaxine (EFFEXOR) 75 MG tablet Take 1 tablet (75 mg total) by mouth 2 (two) times daily with a meal. 01/10/16  Yes Ria Bush, MD  warfarin (COUMADIN) 2.5 MG tablet Take as directed by anti-coagulation clinic. Patient taking differently: Take 2.5-3.75 mg by mouth daily. Take 3.75 mg on Monday and Friday then take 2.5 mg all the other days 12/06/15  Yes Ria Bush, MD  JANTOVEN 2.5 MG tablet TAKE AS DIRECTED BY        ANTI-COAGULATION CLINIC Patient not taking: Reported on 03/10/2016 02/25/16   Ria Bush, MD   BP 123/60 mmHg  Pulse 72  Temp(Src) 98.2 F (36.8 C) (Oral)  Resp 14  Ht 5\' 6"  (1.676 m)  Wt 160 lb (72.576 kg)  BMI 25.84 kg/m2  SpO2 94% Physical Exam  Constitutional: He is oriented  to person, place, and time. Vital signs are normal. He appears well-developed and well-nourished.  Non-toxic appearance. He does not appear ill. No distress.  HENT:  Head: Normocephalic and atraumatic.  Nose: Nose normal.  Mouth/Throat: Oropharynx is clear and moist. No oropharyngeal exudate.  Eyes: Conjunctivae and EOM are normal. Pupils are equal, round, and reactive to light. No scleral icterus.  Neck: Normal range of motion. Neck supple. No tracheal deviation, no edema, no erythema and normal range of motion present. No thyroid mass and no thyromegaly present.  C-collar on  Cardiovascular: Normal rate, regular rhythm, S1 normal, S2 normal, normal heart sounds, intact distal pulses and normal pulses.  Exam reveals no gallop and no friction rub.   No murmur heard. Pulmonary/Chest: Effort normal and breath sounds normal. No respiratory distress. He has no wheezes. He has no rhonchi. He has no rales.  [place] in place    Abdominal: Soft. Normal appearance and bowel sounds are normal. He exhibits no distension, no ascites and no mass. There is no hepatosplenomegaly. There is tenderness. There is no rebound, no guarding and no CVA tenderness.  RLQ TTP  Musculoskeletal: Normal range of motion. He exhibits tenderness.  Left gluteal TTP and left gluteus soft tissue hematoma Left knee TTP Small area of ecchymosis over left knee; nml ROM of knee   Lymphadenopathy:    He has no cervical adenopathy.  Neurological: He is alert and oriented to person, place, and time. He has normal strength. No cranial nerve deficit or sensory deficit.  Normal strength and sensation to all extremities Normal cerebellar testing  Skin: Skin is warm, dry and intact. No petechiae and no rash noted. He is  not diaphoretic. No pallor.  Psychiatric: He has a normal mood and affect. His behavior is normal. Judgment normal.  Nursing note and vitals reviewed.   ED Course  Procedures   DIAGNOSTIC STUDIES:  Oxygen Saturation is  93% on RA, adequate by my interpretation.    COORDINATION OF CARE:  12:00 AM Discussed treatment plan with pt at bedside and pt agreed to plan.  Labs Review Labs Reviewed  CBC WITH DIFFERENTIAL/PLATELET - Abnormal; Notable for the following:    RBC 2.92 (*)    Hemoglobin 10.8 (*)    HCT 32.8 (*)    MCV 112.3 (*)    MCH 37.0 (*)    Platelets 118 (*)    All other components within normal limits  COMPREHENSIVE METABOLIC PANEL - Abnormal; Notable for the following:    Glucose, Bld 124 (*)    Creatinine, Ser 1.50 (*)    Total Protein 5.7 (*)    Albumin 3.3 (*)    AST 58 (*)    GFR calc non Af Amer 42 (*)    GFR calc Af Amer 48 (*)    All other components within normal limits  PROTIME-INR - Abnormal; Notable for the following:    Prothrombin Time 31.1 (*)    INR 3.07 (*)    All other components within normal limits  I-STAT CHEM 8, ED - Abnormal; Notable for the following:    Creatinine, Ser 1.50 (*)    Glucose, Bld 120 (*)    Calcium, Ion 1.25 (*)    Hemoglobin 11.2 (*)    HCT 33.0 (*)    All other components within normal limits    Imaging Review Ct Head Wo Contrast  03/10/2016  CLINICAL DATA:  Hit left parietal-occipital region of the head after fall. Neck pain. Initial encounter. EXAM: CT HEAD WITHOUT CONTRAST CT CERVICAL SPINE WITHOUT CONTRAST TECHNIQUE: Multidetector CT imaging of the head and cervical spine was performed following the standard protocol without intravenous contrast. Multiplanar CT image reconstructions of the cervical spine were also generated. COMPARISON:  MRI of the brain and CT of the head performed 11/12/2015; CT of the cervical spine performed 11/06/2014 FINDINGS: CT HEAD FINDINGS There is no evidence of acute infarction, mass lesion, or intra- or extra-axial hemorrhage on CT. Prominence of the ventricles and sulci reflects moderate cortical volume loss. Mild cerebellar atrophy is noted. Scattered periventricular and subcortical white matter change likely  reflects small vessel ischemic microangiopathy. Incidental note is made of a cavum septum pellucidum. The brainstem and fourth ventricle are within normal limits. The basal ganglia are unremarkable in appearance. The cerebral hemispheres demonstrate grossly normal gray-white differentiation. No mass effect or midline shift is seen. There is no evidence of fracture; visualized osseous structures are unremarkable in appearance. The visualized portions of the orbits are within normal limits. The paranasal sinuses and mastoid air cells are well-aerated. No significant soft tissue abnormalities are seen. CT CERVICAL SPINE FINDINGS There is no evidence of fracture or subluxation. Vertebral bodies demonstrate normal alignment. There is slight chronic loss of height along the lower cervical vertebral bodies, with prominent anterior osteophytes noted along the cervical spine. Intervertebral disc spaces are preserved. Prevertebral soft tissues are within normal limits. The thyroid gland is unremarkable in appearance. Mild atelectasis is noted at the lung apices. No significant soft tissue abnormalities are seen. IMPRESSION: 1. No evidence of traumatic intracranial injury or fracture. 2. No evidence of fracture or subluxation along the cervical spine. 3. Moderate cortical volume loss and  scattered small vessel ischemic microangiopathy. 4. Degenerative change along the cervical spine. 5. Mild atelectasis at the lung apices. Electronically Signed   By: Garald Balding M.D.   On: 03/10/2016 03:36   Ct Cervical Spine Wo Contrast  03/10/2016  CLINICAL DATA:  Hit left parietal-occipital region of the head after fall. Neck pain. Initial encounter. EXAM: CT HEAD WITHOUT CONTRAST CT CERVICAL SPINE WITHOUT CONTRAST TECHNIQUE: Multidetector CT imaging of the head and cervical spine was performed following the standard protocol without intravenous contrast. Multiplanar CT image reconstructions of the cervical spine were also generated.  COMPARISON:  MRI of the brain and CT of the head performed 11/12/2015; CT of the cervical spine performed 11/06/2014 FINDINGS: CT HEAD FINDINGS There is no evidence of acute infarction, mass lesion, or intra- or extra-axial hemorrhage on CT. Prominence of the ventricles and sulci reflects moderate cortical volume loss. Mild cerebellar atrophy is noted. Scattered periventricular and subcortical white matter change likely reflects small vessel ischemic microangiopathy. Incidental note is made of a cavum septum pellucidum. The brainstem and fourth ventricle are within normal limits. The basal ganglia are unremarkable in appearance. The cerebral hemispheres demonstrate grossly normal gray-white differentiation. No mass effect or midline shift is seen. There is no evidence of fracture; visualized osseous structures are unremarkable in appearance. The visualized portions of the orbits are within normal limits. The paranasal sinuses and mastoid air cells are well-aerated. No significant soft tissue abnormalities are seen. CT CERVICAL SPINE FINDINGS There is no evidence of fracture or subluxation. Vertebral bodies demonstrate normal alignment. There is slight chronic loss of height along the lower cervical vertebral bodies, with prominent anterior osteophytes noted along the cervical spine. Intervertebral disc spaces are preserved. Prevertebral soft tissues are within normal limits. The thyroid gland is unremarkable in appearance. Mild atelectasis is noted at the lung apices. No significant soft tissue abnormalities are seen. IMPRESSION: 1. No evidence of traumatic intracranial injury or fracture. 2. No evidence of fracture or subluxation along the cervical spine. 3. Moderate cortical volume loss and scattered small vessel ischemic microangiopathy. 4. Degenerative change along the cervical spine. 5. Mild atelectasis at the lung apices. Electronically Signed   By: Garald Balding M.D.   On: 03/10/2016 03:36   Ct Abdomen  Pelvis W Contrast  03/10/2016  CLINICAL DATA:  Fall striking left side of body on the floor. Right lower quadrant pain. On anticoagulation. EXAM: CT ABDOMEN AND PELVIS WITH CONTRAST TECHNIQUE: Multidetector CT imaging of the abdomen and pelvis was performed using the standard protocol following bolus administration of intravenous contrast. CONTRAST:  57mL ISOVUE-300 IOPAMIDOL (ISOVUE-300) INJECTION 61% COMPARISON:  CT 06/03/2014 FINDINGS: Lower chest: Coronary artery calcifications versus stents. Dependent lower lobe densities likely atelectasis. More rounded density in the right lower lobe is likely round atelectasis. Bilateral pleural thickening. Liver: No focal lesion or acute abnormality.  Prominent left lobe. Hepatobiliary: Calcified gallstones within physiologically distended gallbladder. No abnormal biliary dilatation. Pancreas: No ductal dilatation or inflammation. Spleen: Calcified granulomas. Subcentimeter hypodensity is nonspecific, likely small cyst or hemangioma, was present on prior exam. No evidence of traumatic injury or adjacent free fluid. Adrenal glands: No nodule or hemorrhage. Kidneys: Symmetric renal enhancement. No hydronephrosis. Exophytic cyst from the left kidney is unchanged. Additional tiny cortical hypodensity, too small to accurately characterize. Stomach/Bowel: Stomach is distended with ingested contents. There are no dilated or thickened small bowel loops. Moderate volume of stool throughout the colon without colonic wall thickening. The appendix is not visualized, surgically absent per report. No  traumatic injury or mesenteric hematoma. Vascular/Lymphatic: No retroperitoneal fluid or adenopathy. Abdominal aorta is normal in caliber. Mild atherosclerosis without aneurysm. Reproductive: Prominent prostate gland. Bladder: Physiologically distended.  No perivesicular inflammation. Other: No free air, free fluid, or intra-abdominal fluid collection. Fat within both inguinal canals.  Musculoskeletal: Partially cannulated screws traverse the right femoral neck. Left hip to be evaluated on dedicated left hip CT, reported separately. Diffuse bony under mineralization. No compression deformity or acute lumbar spine fracture. Multilevel degenerative change. Old fractures of the medial and lateral left ribs scattered soft tissue edema without confluent hematoma of the subcutaneous tissues. IMPRESSION: 1. No acute traumatic abnormality of the abdomen or pelvis. 2. Chronic findings as described. 3. Atherosclerosis of the abdominal aorta. Coronary artery calcifications. Electronically Signed   By: Jeb Levering M.D.   On: 03/10/2016 03:36   Ct Hip Left Wo Contrast  03/10/2016  CLINICAL DATA:  Left hip pain after fall. EXAM: CT OF THE LEFT HIP WITHOUT CONTRAST TECHNIQUE: Multidetector CT imaging of the left hip was performed according to the standard protocol. Multiplanar CT image reconstructions were also generated. COMPARISON:  Left hip radiographs earlier this day. FINDINGS: Impaction fracture of the left femoral neck. No significant displacement. Left hip osteoarthritis with acetabular spurring and fragmentation. Pubic rami are intact. No large soft tissue hematoma. Soft tissue edema laterally. Excreted intravenous contrast in the urinary bladder. IMPRESSION: Impaction fracture of the left femoral neck. No significant displacement. Electronically Signed   By: Jeb Levering M.D.   On: 03/10/2016 04:09   Chest Portable 1 View  03/10/2016  CLINICAL DATA:  Preoperative chest radiograph.  Initial encounter. EXAM: PORTABLE CHEST 1 VIEW COMPARISON:  Chest radiograph performed 11/12/2015 FINDINGS: The lungs are well-aerated. Mild bibasilar atelectasis is noted. There is no evidence of pleural effusion or pneumothorax. The cardiomediastinal silhouette is enlarged. No acute osseous abnormalities are seen. IMPRESSION: Mild bibasilar atelectasis noted.  Cardiomegaly seen. Electronically Signed   By:  Garald Balding M.D.   On: 03/10/2016 04:04   Dg Knee Complete 4 Views Left  03/10/2016  CLINICAL DATA:  Unwitnessed fall while ambulating with walker. Fall in living room. Left hip and knee pain after fall. EXAM: LEFT KNEE - COMPLETE 4+ VIEW COMPARISON:  None. FINDINGS: No fracture or dislocation. The alignment and joint spaces are maintained. 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 No joint effusion. Small quadriceps tendon enthesophyte. Vascular calcifications are seen. IMPRESSION: No fracture or subluxation of the left knee. Electronically Signed   By: Jeb Levering M.D.   On: 03/10/2016 00:54   Dg Hip Unilat With Pelvis 2-3 Views Left  03/10/2016  CLINICAL DATA:  Unwitnessed fall while ambulating with walker. Fall in living room. Left hip and knee pain after fall. EXAM: DG HIP (WITH OR WITHOUT PELVIS) 2-3V LEFT COMPARISON:  None. FINDINGS: Linear sclerosis involving the subcapital left femoral neck, suspicious for impacted femoral neck fracture. Femoral head remains seated in the acetabulum. Fragmentation multiple lateral acetabulum is similar to prior abdominal radiograph from 2011 and appears chronic. No evidence pubic rami fracture. Multiple partially cannulated screws traverse the right proximal femur. IMPRESSION: Findings suspicious for impacted left femoral neck fracture. Electronically Signed   By: Jeb Levering M.D.   On: 03/10/2016 00:52   I have personally reviewed and evaluated these images and lab results as part of my medical decision-making.   EKG Interpretation None      1:10 AM Discussed case with Dr. Onnie Graham and Alcario Drought  MDM   Final diagnoses:  None  Patient presents to the ED after a fall on coumadin.  Xray reveals L fem neck fracture and I spoke with Dr. Onnie Graham who agrees with admission to the hospitalist.  CT head, neck, abdomen and hip were ordered as well due to pain in these areas.  Dr. Alcario Drought accepts the patient for admission.  4:44 AM CT scans are  negative for any injury. His color was clinically cleared. Patient safe for transfer to the hospitalist service.   I personally performed the services described in this documentation, which was scribed in my presence. The recorded information has been reviewed and is accurate.      Everlene Balls, MD 03/10/16 [phone]

## 2016-03-09 NOTE — ED Notes (Signed)
Patient arrived to ED via [facility] from home. Lives with wife. Patient experienced fall at home which was heard, but not visually witnessed. Reported he had been ambulating with walker & became tangled and fell in living room. Patient believes he struck his head against furniture and possibly floor. Denies LOC. Reports pain in L buttock/hip and L knee. No deformities noted. C-collar applied by EMS. 20 gauge IV in R wrist. Fentanyl 150 administered by EMS. Patient 90-91% after administration on room air. Oxygen 1 LPM via nasal cannula applied. 96% on oxygen. BP 130/70, Pulse 80, Resp 14.

## 2016-03-10 ENCOUNTER — Inpatient Hospital Stay (HOSPITAL_COMMUNITY): Payer: Medicare Other

## 2016-03-10 ENCOUNTER — Emergency Department (HOSPITAL_COMMUNITY): Payer: Medicare Other

## 2016-03-10 ENCOUNTER — Telehealth: Payer: Self-pay | Admitting: Family Medicine

## 2016-03-10 ENCOUNTER — Encounter (HOSPITAL_COMMUNITY): Payer: Self-pay | Admitting: Emergency Medicine

## 2016-03-10 DIAGNOSIS — Z8711 Personal history of peptic ulcer disease: Secondary | ICD-10-CM | POA: Diagnosis not present

## 2016-03-10 DIAGNOSIS — H9190 Unspecified hearing loss, unspecified ear: Secondary | ICD-10-CM | POA: Diagnosis present

## 2016-03-10 DIAGNOSIS — W010XXA Fall on same level from slipping, tripping and stumbling without subsequent striking against object, initial encounter: Secondary | ICD-10-CM | POA: Diagnosis present

## 2016-03-10 DIAGNOSIS — M542 Cervicalgia: Secondary | ICD-10-CM | POA: Diagnosis not present

## 2016-03-10 DIAGNOSIS — T148 Other injury of unspecified body region: Secondary | ICD-10-CM | POA: Diagnosis not present

## 2016-03-10 DIAGNOSIS — J45909 Unspecified asthma, uncomplicated: Secondary | ICD-10-CM | POA: Diagnosis present

## 2016-03-10 DIAGNOSIS — M25552 Pain in left hip: Secondary | ICD-10-CM | POA: Diagnosis not present

## 2016-03-10 DIAGNOSIS — Z7901 Long term (current) use of anticoagulants: Secondary | ICD-10-CM | POA: Diagnosis not present

## 2016-03-10 DIAGNOSIS — Z01818 Encounter for other preprocedural examination: Secondary | ICD-10-CM | POA: Diagnosis not present

## 2016-03-10 DIAGNOSIS — G4730 Sleep apnea, unspecified: Secondary | ICD-10-CM | POA: Diagnosis present

## 2016-03-10 DIAGNOSIS — S72002A Fracture of unspecified part of neck of left femur, initial encounter for closed fracture: Secondary | ICD-10-CM | POA: Diagnosis not present

## 2016-03-10 DIAGNOSIS — R1031 Right lower quadrant pain: Secondary | ICD-10-CM | POA: Diagnosis not present

## 2016-03-10 DIAGNOSIS — S72002B Fracture of unspecified part of neck of left femur, initial encounter for open fracture type I or II: Secondary | ICD-10-CM | POA: Diagnosis not present

## 2016-03-10 DIAGNOSIS — S72001A Fracture of unspecified part of neck of right femur, initial encounter for closed fracture: Secondary | ICD-10-CM | POA: Diagnosis not present

## 2016-03-10 DIAGNOSIS — Z87442 Personal history of urinary calculi: Secondary | ICD-10-CM | POA: Diagnosis not present

## 2016-03-10 DIAGNOSIS — Y92018 Other place in single-family (private) house as the place of occurrence of the external cause: Secondary | ICD-10-CM | POA: Diagnosis not present

## 2016-03-10 DIAGNOSIS — S0990XA Unspecified injury of head, initial encounter: Secondary | ICD-10-CM | POA: Diagnosis not present

## 2016-03-10 DIAGNOSIS — N390 Urinary tract infection, site not specified: Secondary | ICD-10-CM | POA: Diagnosis not present

## 2016-03-10 DIAGNOSIS — N183 Chronic kidney disease, stage 3 (moderate): Secondary | ICD-10-CM | POA: Diagnosis present

## 2016-03-10 DIAGNOSIS — Z823 Family history of stroke: Secondary | ICD-10-CM | POA: Diagnosis not present

## 2016-03-10 DIAGNOSIS — S8992XA Unspecified injury of left lower leg, initial encounter: Secondary | ICD-10-CM | POA: Diagnosis not present

## 2016-03-10 DIAGNOSIS — J9811 Atelectasis: Secondary | ICD-10-CM | POA: Diagnosis not present

## 2016-03-10 DIAGNOSIS — I10 Essential (primary) hypertension: Secondary | ICD-10-CM | POA: Diagnosis not present

## 2016-03-10 DIAGNOSIS — Z809 Family history of malignant neoplasm, unspecified: Secondary | ICD-10-CM | POA: Diagnosis not present

## 2016-03-10 DIAGNOSIS — Z821 Family history of blindness and visual loss: Secondary | ICD-10-CM | POA: Diagnosis not present

## 2016-03-10 DIAGNOSIS — I2510 Atherosclerotic heart disease of native coronary artery without angina pectoris: Secondary | ICD-10-CM | POA: Diagnosis present

## 2016-03-10 DIAGNOSIS — M79605 Pain in left leg: Secondary | ICD-10-CM | POA: Diagnosis not present

## 2016-03-10 DIAGNOSIS — K210 Gastro-esophageal reflux disease with esophagitis: Secondary | ICD-10-CM | POA: Diagnosis not present

## 2016-03-10 DIAGNOSIS — S3991XA Unspecified injury of abdomen, initial encounter: Secondary | ICD-10-CM | POA: Diagnosis not present

## 2016-03-10 DIAGNOSIS — K219 Gastro-esophageal reflux disease without esophagitis: Secondary | ICD-10-CM | POA: Diagnosis present

## 2016-03-10 DIAGNOSIS — I517 Cardiomegaly: Secondary | ICD-10-CM | POA: Diagnosis not present

## 2016-03-10 DIAGNOSIS — Z811 Family history of alcohol abuse and dependence: Secondary | ICD-10-CM | POA: Diagnosis not present

## 2016-03-10 DIAGNOSIS — R338 Other retention of urine: Secondary | ICD-10-CM | POA: Diagnosis not present

## 2016-03-10 DIAGNOSIS — Z5189 Encounter for other specified aftercare: Secondary | ICD-10-CM | POA: Diagnosis not present

## 2016-03-10 DIAGNOSIS — M6281 Muscle weakness (generalized): Secondary | ICD-10-CM | POA: Diagnosis not present

## 2016-03-10 DIAGNOSIS — Z8249 Family history of ischemic heart disease and other diseases of the circulatory system: Secondary | ICD-10-CM | POA: Diagnosis not present

## 2016-03-10 DIAGNOSIS — E781 Pure hyperglyceridemia: Secondary | ICD-10-CM | POA: Diagnosis present

## 2016-03-10 DIAGNOSIS — M810 Age-related osteoporosis without current pathological fracture: Secondary | ICD-10-CM | POA: Diagnosis present

## 2016-03-10 DIAGNOSIS — Z8673 Personal history of transient ischemic attack (TIA), and cerebral infarction without residual deficits: Secondary | ICD-10-CM | POA: Diagnosis not present

## 2016-03-10 DIAGNOSIS — S79912A Unspecified injury of left hip, initial encounter: Secondary | ICD-10-CM | POA: Diagnosis not present

## 2016-03-10 DIAGNOSIS — Z86711 Personal history of pulmonary embolism: Secondary | ICD-10-CM | POA: Diagnosis not present

## 2016-03-10 DIAGNOSIS — Z833 Family history of diabetes mellitus: Secondary | ICD-10-CM | POA: Diagnosis not present

## 2016-03-10 DIAGNOSIS — R41841 Cognitive communication deficit: Secondary | ICD-10-CM | POA: Diagnosis not present

## 2016-03-10 DIAGNOSIS — S72009A Fracture of unspecified part of neck of unspecified femur, initial encounter for closed fracture: Secondary | ICD-10-CM | POA: Diagnosis not present

## 2016-03-10 DIAGNOSIS — M25562 Pain in left knee: Secondary | ICD-10-CM | POA: Diagnosis not present

## 2016-03-10 DIAGNOSIS — E569 Vitamin deficiency, unspecified: Secondary | ICD-10-CM | POA: Diagnosis not present

## 2016-03-10 DIAGNOSIS — F329 Major depressive disorder, single episode, unspecified: Secondary | ICD-10-CM | POA: Diagnosis present

## 2016-03-10 DIAGNOSIS — S72042A Displaced fracture of base of neck of left femur, initial encounter for closed fracture: Secondary | ICD-10-CM | POA: Diagnosis not present

## 2016-03-10 DIAGNOSIS — E785 Hyperlipidemia, unspecified: Secondary | ICD-10-CM | POA: Diagnosis present

## 2016-03-10 DIAGNOSIS — S199XXA Unspecified injury of neck, initial encounter: Secondary | ICD-10-CM | POA: Diagnosis not present

## 2016-03-10 DIAGNOSIS — R259 Unspecified abnormal involuntary movements: Secondary | ICD-10-CM | POA: Diagnosis not present

## 2016-03-10 LAB — COMPREHENSIVE METABOLIC PANEL
ALT: 48 U/L (ref 17–63)
AST: 58 U/L — AB (ref 15–41)
Albumin: 3.3 g/dL — ABNORMAL LOW (ref 3.5–5.0)
Alkaline Phosphatase: 38 U/L (ref 38–126)
Anion gap: 5 (ref 5–15)
BUN: 18 mg/dL (ref 6–20)
CHLORIDE: 104 mmol/L (ref 101–111)
CO2: 28 mmol/L (ref 22–32)
CREATININE: 1.5 mg/dL — AB (ref 0.61–1.24)
Calcium: 9.3 mg/dL (ref 8.9–10.3)
GFR calc Af Amer: 48 mL/min — ABNORMAL LOW (ref >60)
GFR, EST NON AFRICAN AMERICAN: 42 mL/min — AB (ref >60)
GLUCOSE: 124 mg/dL — AB (ref 65–99)
Potassium: 4.7 mmol/L (ref 3.5–5.1)
Sodium: 137 mmol/L (ref 135–145)
Total Bilirubin: 1 mg/dL (ref 0.3–1.2)
Total Protein: 5.7 g/dL — ABNORMAL LOW (ref 6.5–8.1)

## 2016-03-10 LAB — ABO/RH: ABO/RH(D): A POS

## 2016-03-10 LAB — TYPE AND SCREEN
ABO/RH(D): A POS
Antibody Screen: NEGATIVE

## 2016-03-10 LAB — CBC WITH DIFFERENTIAL/PLATELET
BASOS PCT: 0 %
Basophils Absolute: 0 10*3/uL (ref 0.0–0.1)
EOS PCT: 2 %
Eosinophils Absolute: 0.2 10*3/uL (ref 0.0–0.7)
HEMATOCRIT: 32.8 % — AB (ref 39.0–52.0)
Hemoglobin: 10.8 g/dL — ABNORMAL LOW (ref 13.0–17.0)
LYMPHS PCT: 16 %
Lymphs Abs: 1.4 10*3/uL (ref 0.7–4.0)
MCH: 37 pg — ABNORMAL HIGH (ref 26.0–34.0)
MCHC: 32.9 g/dL (ref 30.0–36.0)
MCV: 112.3 fL — AB (ref 78.0–100.0)
Monocytes Absolute: 0.5 10*3/uL (ref 0.1–1.0)
Monocytes Relative: 6 %
NEUTROS PCT: 76 %
Neutro Abs: 6.8 10*3/uL (ref 1.7–7.7)
PLATELETS: 118 10*3/uL — AB (ref 150–400)
RBC: 2.92 MIL/uL — ABNORMAL LOW (ref 4.22–5.81)
RDW: 14.5 % (ref 11.5–15.5)
WBC: 8.9 10*3/uL (ref 4.0–10.5)

## 2016-03-10 LAB — PROTIME-INR
INR: 3.07 — ABNORMAL HIGH (ref 0.00–1.49)
INR: 1.85 — AB (ref 0.00–1.49)
PROTHROMBIN TIME: 31.1 s — AB (ref 11.6–15.2)
Prothrombin Time: 21.3 seconds — ABNORMAL HIGH (ref 11.6–15.2)

## 2016-03-10 LAB — I-STAT CHEM 8, ED
BUN: 20 mg/dL (ref 6–20)
CHLORIDE: 102 mmol/L (ref 101–111)
CREATININE: 1.5 mg/dL — AB (ref 0.61–1.24)
Calcium, Ion: 1.25 mmol/L — ABNORMAL HIGH (ref 1.12–1.23)
GLUCOSE: 120 mg/dL — AB (ref 65–99)
HCT: 33 % — ABNORMAL LOW (ref 39.0–52.0)
HEMOGLOBIN: 11.2 g/dL — AB (ref 13.0–17.0)
POTASSIUM: 4.7 mmol/L (ref 3.5–5.1)
Sodium: 140 mmol/L (ref 135–145)
TCO2: 28 mmol/L (ref 0–100)

## 2016-03-10 LAB — MRSA PCR SCREENING: MRSA BY PCR: NEGATIVE

## 2016-03-10 MED ORDER — SODIUM CHLORIDE 0.9 % IV SOLN
Freq: Once | INTRAVENOUS | Status: AC
  Administered 2016-03-10: 09:00:00 via INTRAVENOUS

## 2016-03-10 MED ORDER — GABAPENTIN 300 MG PO CAPS
300.0000 mg | ORAL_CAPSULE | Freq: Every day | ORAL | Status: DC
Start: 2016-03-10 — End: 2016-03-13
  Administered 2016-03-10 – 2016-03-13 (×4): 300 mg via ORAL
  Filled 2016-03-10 (×5): qty 1

## 2016-03-10 MED ORDER — VITAMIN K1 10 MG/ML IJ SOLN
5.0000 mg | Freq: Once | INTRAMUSCULAR | Status: AC
  Administered 2016-03-10: 5 mg via INTRAVENOUS
  Filled 2016-03-10: qty 0.5

## 2016-03-10 MED ORDER — GABAPENTIN 100 MG PO CAPS
200.0000 mg | ORAL_CAPSULE | Freq: Every day | ORAL | Status: DC
  Administered 2016-03-10 – 2016-03-12 (×3): 200 mg via ORAL
  Filled 2016-03-10 (×2): qty 2

## 2016-03-10 MED ORDER — ALBUTEROL SULFATE (2.5 MG/3ML) 0.083% IN NEBU: 3.0000 mL | INHALATION_SOLUTION | Freq: Four times a day (QID) | RESPIRATORY_TRACT | Status: DC | PRN

## 2016-03-10 MED ORDER — HYDROCODONE-ACETAMINOPHEN 5-325 MG PO TABS
1.0000 | ORAL_TABLET | Freq: Four times a day (QID) | ORAL | Status: DC | PRN
  Administered 2016-03-10 – 2016-03-13 (×6): 2 via ORAL
  Filled 2016-03-10 (×6): qty 2

## 2016-03-10 MED ORDER — VENLAFAXINE HCL 75 MG PO TABS
75.0000 mg | ORAL_TABLET | Freq: Two times a day (BID) | ORAL | Status: DC
  Administered 2016-03-10 – 2016-03-13 (×6): 75 mg via ORAL
  Filled 2016-03-10 (×8): qty 1

## 2016-03-10 MED ORDER — IOPAMIDOL (ISOVUE-300) INJECTION 61%
INTRAVENOUS | Status: AC
  Administered 2016-03-10: 80 mL
  Filled 2016-03-10: qty 100

## 2016-03-10 MED ORDER — SODIUM CHLORIDE 0.9 % IV BOLUS (SEPSIS)
1000.0000 mL | Freq: Once | INTRAVENOUS | Status: AC
  Administered 2016-03-10: 1000 mL via INTRAVENOUS

## 2016-03-10 MED ORDER — PANTOPRAZOLE SODIUM 40 MG PO TBEC
40.0000 mg | DELAYED_RELEASE_TABLET | Freq: Every day | ORAL | Status: DC
  Administered 2016-03-10 – 2016-03-13 (×4): 40 mg via ORAL
  Filled 2016-03-10 (×5): qty 1

## 2016-03-10 MED ORDER — HYDROMORPHONE HCL 1 MG/ML IJ SOLN
1.0000 mg | Freq: Once | INTRAMUSCULAR | Status: AC
  Administered 2016-03-10: 1 mg via INTRAVENOUS
  Filled 2016-03-10: qty 1

## 2016-03-10 MED ORDER — MORPHINE SULFATE (PF) 2 MG/ML IV SOLN
0.5000 mg | INTRAVENOUS | Status: DC | PRN
  Administered 2016-03-11: 0.5 mg via INTRAVENOUS
  Filled 2016-03-10: qty 1

## 2016-03-10 MED ORDER — ENOXAPARIN SODIUM 40 MG/0.4ML ~~LOC~~ SOLN
40.0000 mg | SUBCUTANEOUS | Status: DC
  Administered 2016-03-10: 40 mg via SUBCUTANEOUS

## 2016-03-10 MED ORDER — DOCUSATE SODIUM 100 MG PO CAPS
100.0000 mg | ORAL_CAPSULE | Freq: Every day | ORAL | Status: DC
  Administered 2016-03-10 – 2016-03-12 (×3): 100 mg via ORAL
  Filled 2016-03-10 (×3): qty 1

## 2016-03-10 MED ORDER — FENOFIBRATE 160 MG PO TABS
160.0000 mg | ORAL_TABLET | Freq: Every day | ORAL | Status: DC
  Administered 2016-03-10 – 2016-03-13 (×4): 160 mg via ORAL
  Filled 2016-03-10 (×5): qty 1

## 2016-03-10 MED ORDER — SIMVASTATIN 40 MG PO TABS
40.0000 mg | ORAL_TABLET | Freq: Every day | ORAL | Status: DC
  Administered 2016-03-10 – 2016-03-13 (×4): 40 mg via ORAL
  Filled 2016-03-10 (×5): qty 1

## 2016-03-10 MED ORDER — LORATADINE 10 MG PO TABS
10.0000 mg | ORAL_TABLET | Freq: Every day | ORAL | Status: DC
  Administered 2016-03-10 – 2016-03-13 (×4): 10 mg via ORAL
  Filled 2016-03-10 (×5): qty 1

## 2016-03-10 NOTE — Care Management Important Message (Signed)
Important Message  Patient Details  Name: [name] MRN: [id number] Date of Birth: [birth year]   Medicare Important Message Given:  Yes    [name] Abena 03/10/2016, 11:29 AM

## 2016-03-10 NOTE — Progress Notes (Signed)
Initial Nutrition Assessment  DOCUMENTATION CODES:   Not applicable  INTERVENTION:  Encourage adequate PO intake.   RD to continue to monitor for needs.   NUTRITION DIAGNOSIS:   Increased nutrient needs related to  (surgery) as evidenced by estimated needs.  GOAL:   Patient will meet greater than or equal to 90% of their needs  MONITOR:   PO intake, Weight trends, Labs, I & O's, Skin  REASON FOR ASSESSMENT:   Consult Hip fracture protocol  ASSESSMENT:   82 y.o. male with medical history significant of R hip fx, CKD stage 3, prior PE and TIA on coumadin. Patient presents to the ED after a mechanical fall at home. This occurred just PTA, he tripped over his walker. [place] [address] struck head on couch and carpeted floor. Reports pain to left thigh and knee. Unable to bear weight on hip. H/o prior R hip fx and C7 fx from falls.. Pt with L hip fx.  Plans for surgery tomorrow or Wednesday. Pt reports having a good appetite currently and PTA with usual consumption of at least 2 meals a day. Meal completion has been 100%. Usual body weight reported to be ~170 lbs. Per Epic weight records, pt with a 9.6% weight loss in 2 months. Pt unaware of lost weight. RD offered nutritional supplements/nourishment snacks, however pt and family member at bedside kindly declined. Noted pt with lactose intolerance. Pt encouraged to eat his food at meals.   Pt with no observed significant fat or muscle mass loss.   Labs and medications reviewed.   Diet Order:  Diet Heart Room service appropriate?: Yes; Fluid consistency:: Thin Diet NPO time specified  Skin:  Reviewed, no issues  Last BM:  7/2  Height:   Ht Readings from Last 1 Encounters:  03/10/16 5\' 6"  (1.676 m)    Weight:   Wt Readings from Last 1 Encounters:  03/10/16 160 lb (72.576 kg)    Ideal Body Weight:  64.5 kg  BMI:  Body mass index is 25.84 kg/(m^2).  Estimated Nutritional Needs:   Kcal:  1850-2000  Protein:  75-85  grams  Fluid:  1.8 - 2 L/day  EDUCATION NEEDS:   No education needs identified at this time  [name] Parker, MS, RD, LDN Pager # [phone] After hours/ weekend pager # [phone]

## 2016-03-10 NOTE — Progress Notes (Signed)
Tele put on pt and consents are signed

## 2016-03-10 NOTE — Progress Notes (Addendum)
ANTICOAGULATION CONSULT NOTE - Initial Consult  Pharmacy Consult for Lovenox Indication: VTE prophylaxis  Allergies  Allergen Reactions  . Lactose Intolerance (Gi)   . Nizatidine     REACTION: Rash (Axid)  . Sulfadiazine     REACTION: Hives  . Aricept [Donepezil Hcl] Rash  . Lipitor [Atorvastatin] Rash  . Penicillins Rash    Has patient had a PCN reaction causing immediate rash, facial/tongue/throat swelling, SOB or lightheadedness with hypotension: No Has patient had a PCN reaction causing severe rash involving mucus membranes or skin necrosis: No Has patient had a PCN reaction that required hospitalization No Has patient had a PCN reaction occurring within the last 10 years: No If all of the above answers are "NO",  then may proceed with Cephalosporin use.    Patient Measurements: Height: 5\' 6"  (167.6 cm) Weight: 160 lb (72.576 kg) IBW/kg (Calculated) : 63.8  Vital Signs: Temp: 98.2 F (36.8 C) (07/03 0005) Temp Source: Oral (07/03 0005) BP: 125/55 mmHg (07/03 0400) Pulse Rate: 84 (07/03 0400)  Labs:  Recent Labs  03/10/16 0022 03/10/16 0058  HGB 10.8* 11.2*  HCT 32.8* 33.0*  PLT 118*  --   LABPROT 31.1*  --   INR 3.07*  --   CREATININE 1.50* 1.50*    Estimated Creatinine Clearance: 34.3 mL/min (by C-G formula based on Cr of 1.5).   Medical History: Past Medical History  Diagnosis Date  . GERD (gastroesophageal reflux disease)     h/o PUD  . CAD (coronary artery disease)     cath 2000 30% single vessel, normal nuclear stress test 12/03/2010, no evidence ischemia  . CKD (chronic kidney disease) stage 3, GFR 30-59 ml/min     baseline Cr 1.7  . Depression   . Hx pulmonary embolism 08/1991    negative hypercoagulable panel 12/2014  . History of phlebitis   . Allergic rhinitis   . Urge incontinence   . Elevated PSA     previous-normalized (followed by Dr. Jeffie Pollock, rec no repeat unless urinary sxs)  . HLD (hyperlipidemia)     hypertriglyceridemia  .  Asthma     remote  . Arthritis   . Blood transfusion 1990's  . Schatzki's ring 02/2012    s/p dilation [name]  . History of fracture of right hip   . Hearing loss 06/2012    eval - rec annual exam  . Essential tremor 09/27/2008    reviewed eval by Dr Jannifer Franklin in chart 2011   . Prediabetes   . DISH (diffuse idiopathic skeletal hyperostosis) 03/2013    lumbar spine on xray  . DDD (degenerative disc disease) 03/2013    by CT, diffuse multilevel cervical and lumbar spondylosis  . Osteoporosis 11/2013    DEXA T score -2.7  . Macrocytic anemia 2014    stable B12/folate and periph smear 05/2013, again periph smear 2017 with mature cells, mild dacrocytosis  . Sleep apnea     no CPAP- sleep apnea "cleared up 15 years ago"  . Dizziness     multifactorial, s/p PT at [facility] 06/2014 with HEP  . History of pyelonephritis 05/2014    hospitalization with sepsis  . Closed C7 fracture ([place]) 11/06/2014  . UTI (urinary tract infection) 05/13/2015  . UTI (urinary tract infection) due to Enterococcus 05/13/2015  . Candidal urethritis in male 06/15/2015  . Stroke [facility])     Medications:  Prescriptions prior to admission  Medication Sig Dispense Refill Last Dose  . albuterol (PROVENTIL HFA;VENTOLIN HFA) 108 (90  Base) MCG/ACT inhaler Inhale 2 puffs into the lungs every 6 (six) hours as needed for wheezing or shortness of breath. 3 Inhaler 3 Past Week at Unknown time  . b complex vitamins tablet Take 1 tablet by mouth daily.   03/09/2016 at Unknown time  . Biotin 2500 MCG CAPS Take 1 capsule by mouth daily.   03/09/2016 at Unknown time  . Cholecalciferol (VITAMIN D) 2000 UNITS CAPS Take 1 capsule by mouth daily. Reported on 01/30/2016   03/09/2016 at Unknown time  . clotrimazole (LOTRIMIN) 1 % cream Apply 1 application topically 2 (two) times daily. (Patient taking differently: Apply 1 application topically 2 (two) times daily as needed. For rash) 30 g 0 Past Week at Unknown time  . Coenzyme Q10 (COQ-10) 100 MG CAPS Take 1  capsule by mouth daily.    03/09/2016 at Unknown time  . Cyanocobalamin (B-12 PO) Take 10,000 mcg by mouth every Monday, Wednesday, and Friday.    Past Week at Unknown time  . dexlansoprazole (DEXILANT) 60 MG capsule Take 1 capsule (60 mg total) by mouth daily. 90 capsule 2 03/09/2016 at Unknown time  . docusate sodium (COLACE) 100 MG capsule Take 100 mg by mouth at bedtime.    03/08/2016 at Unknown time  . fenofibrate (TRICOR) 145 MG tablet Take 1 tablet (145 mg total) by mouth daily. 90 tablet 2 03/09/2016 at Unknown time  . fexofenadine (ALLEGRA) 180 MG tablet Take 180 mg by mouth daily.    03/09/2016 at Unknown time  . gabapentin (NEURONTIN) 100 MG capsule Take 2 capsules (200 mg total) by mouth 3 (three) times daily. (Patient taking differently: Take 200-300 mg by mouth 2 (two) times daily. Take 3 capsules in the morning Take 2 capsules in the evening) 540 capsule 1 03/09/2016 at Unknown time  . Multiple Vitamins-Minerals (MULTIVITAMIN PO) Take 1 tablet by mouth daily.    03/09/2016 at Unknown time  . simvastatin (ZOCOR) 40 MG tablet Take 1 tablet (40 mg total) by mouth daily. 90 tablet 2 03/09/2016 at Unknown time  . venlafaxine (EFFEXOR) 75 MG tablet Take 1 tablet (75 mg total) by mouth 2 (two) times daily with a meal. 180 tablet 2 03/09/2016 at Unknown time  . warfarin (COUMADIN) 2.5 MG tablet Take as directed by anti-coagulation clinic. (Patient taking differently: Take 2.5-3.75 mg by mouth daily. Take 3.75 mg on Monday and Friday then take 2.5 mg all the other days) 7 tablet 0 03/08/2016 at 2200    Assessment: 82 y/o male admitted s/p mechanical fall resulting in left femoral neck fracture. He is on warfarin PTA for hx PE and TIA; also has a hx of previous fall. Plan is for left hip pinning when INR has normalized. Pharmacy consulted to begin Lovenox for VTE prophylaxis when INR <2.  INR is 3.07 this morning. Vitamin K 5 mg IV given today. Hgb 11.2, platelets 118, no bleeding noted. CKD III, baseline SCr 1.7;  SCr 1.5 currently.  Plan:  Warfarin on hold Lovenox at VTE prophylaxis dose when INR <2 Daily INR      Renold Genta, PharmD, BCPS Clinical Pharmacist Pager: [phone] 03/10/2016 11:30 AM   Addendum: INR now 1.85 s/p vit K.  Begin Lovenox 40 mg SQ q24h  Renold Genta, PharmD, BCPS Clinical Pharmacist Pager: [phone] 03/10/2016 11:51 AM

## 2016-03-10 NOTE — ED Notes (Signed)
Portable chest xray being performed at this time.

## 2016-03-10 NOTE — Progress Notes (Signed)
Surgery is to be done tomorrow due to decrease in INR per Dr Allyson Sabal

## 2016-03-10 NOTE — H&P (Signed)
History and Physical    [name] [id number] DOB: [birth year] DOA: 03/09/2016   PCP: Ria Bush, MD Chief Complaint:  Chief Complaint  Patient presents with  . Fall    HPI: [name] is a 82 y.o. male with medical history significant of R hip fx, CKD stage 3, prior PE and TIA on coumadin.  Patient presents to the ED after a mechanical fall at home.  This occurred just PTA, he tripped over his walker.  [address] struck head on couch and carpeted floor.  Reports pain to left thigh and knee.  Unable to bear weight on hip.  H/o prior R hip fx and C7 fx from falls.  Pain better with fentanyl.  ED Course: Hip fx on X ray.  Review of Systems: As per HPI otherwise 10 point review of systems negative.    Past Medical History  Diagnosis Date  . GERD (gastroesophageal reflux disease)     h/o PUD  . CAD (coronary artery disease)     cath 2000 30% single vessel, normal nuclear stress test 12/03/2010, no evidence ischemia  . CKD (chronic kidney disease) stage 3, GFR 30-59 ml/min     baseline Cr 1.7  . Depression   . Hx pulmonary embolism 08/1991    negative hypercoagulable panel 12/2014  . History of phlebitis   . Allergic rhinitis   . Urge incontinence   . Elevated PSA     previous-normalized (followed by Dr. Jeffie Pollock, rec no repeat unless urinary sxs)  . HLD (hyperlipidemia)     hypertriglyceridemia  . Asthma     remote  . Arthritis   . Blood transfusion 1990's  . Schatzki's ring 02/2012    s/p dilation [name]  . History of fracture of right hip   . Hearing loss 06/2012    eval - rec annual exam  . Essential tremor 09/27/2008    reviewed eval by Dr Jannifer Franklin in chart 2011   . Prediabetes   . DISH (diffuse idiopathic skeletal hyperostosis) 03/2013    lumbar spine on xray  . DDD (degenerative disc disease) 03/2013    by CT, diffuse multilevel cervical and lumbar spondylosis  . Osteoporosis 11/2013    DEXA T score -2.7  . Macrocytic anemia 2014    stable B12/folate and  periph smear 05/2013, again periph smear 2017 with mature cells, mild dacrocytosis  . Sleep apnea     no CPAP- sleep apnea "cleared up 15 years ago"  . Dizziness     multifactorial, s/p PT at [facility] 06/2014 with HEP  . History of pyelonephritis 05/2014    hospitalization with sepsis  . Closed C7 fracture ([place]) 11/06/2014  . UTI (urinary tract infection) 05/13/2015  . UTI (urinary tract infection) due to Enterococcus 05/13/2015  . Candidal urethritis in male 06/15/2015  . Stroke [facility])     Past Surgical History  Procedure Laterality Date  . Tonsillectomy  1965  . Hernia repair  1979    Left  . Cystoscopy  1992    for kidney stones  . Exploratory laparotomy  1996    with incidental appendectomy  . Appendectomy  1996  . Knee arthroscopy  03/24/02    Right (Dr. Mauri Pole)  . Orif femoral neck fracture w/ dhs  08/03/02    Right (Dr. Mauri Pole)  . Ct abd w & pelvis wo cm  07/2001    Scarring of right lung, stable negative o/w  . Ct abd w & pelvis wo cm  11/2000    ?  stones, right LL scarring  . V/q scan  06/1999    negative  . Colonoscopy  1998    [place]  . US echocardiography  12/28/2007    Mild aortic valve calcification EF 55%, basically nml  . Carotid u/s  12/28/2007    nml  . Eeg  11/12/2009    nml  . Mri  11/2009    Head, nml  . Cataract extraction  Jan, March 2011    bilateral  . Colonoscopy  07/2010    5 polyps, adenomatous, rec rpt 3 yrs  . Esophagogastroduodenoscopy  02/2012    dilation of schatzki's ring [name]  . Upper gastrointestinal endoscopy    . Colonoscopy  04/2014    3 polyps, adenomatous, f/u open ended given age [name]  . Cardiac catheterization  2000    30% one vessel  . Cardiovascular stress test  03/2015    no ischemia, low risk, EF 76%  . Esophagogastroduodenoscopy N/A 11/15/2015    Procedure: ESOPHAGOGASTRODUODENOSCOPY (EGD);  Surgeon: Gatha Mayer, MD;  Location: [facility] ENDOSCOPY;  Service: Endoscopy;  Laterality: N/A;     reports that he has never  smoked. He has never used smokeless tobacco. He reports that he does not drink alcohol or use illicit drugs.  Allergies  Allergen Reactions  . Lactose Intolerance (Gi)   . Nizatidine     REACTION: Rash (Axid)  . Sulfadiazine     REACTION: Hives  . Aricept [Donepezil Hcl] Rash  . Lipitor [Atorvastatin] Rash  . Penicillins Rash    Has patient had a PCN reaction causing immediate rash, facial/tongue/throat swelling, SOB or lightheadedness with hypotension: No Has patient had a PCN reaction causing severe rash involving mucus membranes or skin necrosis: No Has patient had a PCN reaction that required hospitalization No Has patient had a PCN reaction occurring within the last 10 years: No If all of the above answers are "NO",  then may proceed with Cephalosporin use.    Family History  Problem Relation Age of Onset  . Stroke Mother   . Hypertension Mother   . Cancer Brother     prostate with mets  . Blindness Brother     legally  . Diabetes Brother   . Pulmonary embolism Sister     from shoulder operation  . Alcohol abuse Brother   . Colon cancer Neg Hx   . Esophageal cancer Neg Hx   . Rectal cancer Neg Hx   . Stomach cancer Neg Hx       Prior to Admission medications   Medication Sig Start Date End Date Taking? Authorizing Provider  albuterol (PROVENTIL HFA;VENTOLIN HFA) 108 (90 Base) MCG/ACT inhaler Inhale 2 puffs into the lungs every 6 (six) hours as needed for wheezing or shortness of breath. 01/10/16  Yes Ria Bush, MD  b complex vitamins tablet Take 1 tablet by mouth daily.   Yes Historical Provider, MD  Biotin 2500 MCG CAPS Take 1 capsule by mouth daily.   Yes Historical Provider, MD  Cholecalciferol (VITAMIN D) 2000 UNITS CAPS Take 1 capsule by mouth daily. Reported on 01/30/2016   Yes Historical Provider, MD  clotrimazole (LOTRIMIN) 1 % cream Apply 1 application topically 2 (two) times daily. Patient taking differently: Apply 1 application topically 2 (two) times  daily as needed. For rash 05/02/15  Yes Pleas Koch, NP  Coenzyme Q10 (COQ-10) 100 MG CAPS Take 1 capsule by mouth daily.    Yes Historical Provider, MD  Cyanocobalamin (B-12 PO)  Take 10,000 mcg by mouth every Monday, Wednesday, and Friday.    Yes Historical Provider, MD  dexlansoprazole (DEXILANT) 60 MG capsule Take 1 capsule (60 mg total) by mouth daily. 01/10/16  Yes Ria Bush, MD  docusate sodium (COLACE) 100 MG capsule Take 100 mg by mouth at bedtime.    Yes Historical Provider, MD  fenofibrate (TRICOR) 145 MG tablet Take 1 tablet (145 mg total) by mouth daily. 01/10/16  Yes Ria Bush, MD  fexofenadine (ALLEGRA) 180 MG tablet Take 180 mg by mouth daily.    Yes Historical Provider, MD  gabapentin (NEURONTIN) 100 MG capsule Take 2 capsules (200 mg total) by mouth 3 (three) times daily. Patient taking differently: Take 200-300 mg by mouth 2 (two) times daily. Take 3 capsules in the morning Take 2 capsules in the evening 09/21/15  Yes Kathrynn Ducking, MD  Multiple Vitamins-Minerals (MULTIVITAMIN PO) Take 1 tablet by mouth daily.    Yes Historical Provider, MD  simvastatin (ZOCOR) 40 MG tablet Take 1 tablet (40 mg total) by mouth daily. 01/10/16  Yes Ria Bush, MD  venlafaxine (EFFEXOR) 75 MG tablet Take 1 tablet (75 mg total) by mouth 2 (two) times daily with a meal. 01/10/16  Yes Ria Bush, MD  warfarin (COUMADIN) 2.5 MG tablet Take as directed by anti-coagulation clinic. Patient taking differently: Take 2.5-3.75 mg by mouth daily. Take 3.75 mg on Monday and Friday then take 2.5 mg all the other days 12/06/15  Yes Ria Bush, MD    Physical Exam: Filed Vitals:   03/10/16 0005 03/10/16 0015 03/10/16 0300 03/10/16 0315  BP: 131/65 123/60 146/64 128/61  Pulse: 75 72 87 87  Temp: 98.2 F (36.8 C)     TempSrc: Oral     Resp:  14 13 13   Height: 5\' 6"  (1.676 m)     Weight: 72.576 kg (160 lb)     SpO2: 93% 94% 94% 94%      Constitutional: NAD, calm,  comfortable Eyes: PERRL, lids and conjunctivae normal ENMT: Mucous membranes are moist. Posterior pharynx clear of any exudate or lesions.Normal dentition.  Neck: normal, supple, no masses, no thyromegaly Respiratory: clear to auscultation bilaterally, no wheezing, no crackles. Normal respiratory effort. No accessory muscle use.  Cardiovascular: Regular rate and rhythm, no murmurs / rubs / gallops. No extremity edema. 2+ pedal pulses. No carotid bruits.  Abdomen: no tenderness, no masses palpated. No hepatosplenomegaly. Bowel sounds positive.  Musculoskeletal: no clubbing / cyanosis. No joint deformity upper and lower extremities. Good ROM, no contractures. Normal muscle tone.  Skin: no rashes, lesions, ulcers. No induration Neurologic: CN 2-12 grossly intact. Sensation intact, DTR normal. Strength 5/5 in all 4.  Psychiatric: Normal judgment and insight. Alert and oriented x 3. Normal mood.    Labs on Admission: I have personally reviewed following labs and imaging studies  CBC:  Recent Labs Lab 03/10/16 0022 03/10/16 0058  WBC 8.9  --   NEUTROABS 6.8  --   HGB 10.8* 11.2*  HCT 32.8* 33.0*  MCV 112.3*  --   PLT 118*  --    Basic Metabolic Panel:  Recent Labs Lab 03/10/16 0022 03/10/16 0058  NA 137 140  K 4.7 4.7  CL 104 102  CO2 28  --   GLUCOSE 124* 120*  BUN 18 20  CREATININE 1.50* 1.50*  CALCIUM 9.3  --    GFR: Estimated Creatinine Clearance: 34.3 mL/min (by C-G formula based on Cr of 1.5). Liver Function Tests:  Recent Labs  Lab 03/10/16 0022  AST 58*  ALT 48  ALKPHOS 38  BILITOT 1.0  PROT 5.7*  ALBUMIN 3.3*   No results for input(s): LIPASE, AMYLASE in the last 168 hours. No results for input(s): AMMONIA in the last 168 hours. Coagulation Profile:  Recent Labs Lab 03/06/16 1028 03/10/16 0022  INR 4.1 3.07*   Cardiac Enzymes: No results for input(s): CKTOTAL, CKMB, CKMBINDEX, TROPONINI in the last 168 hours. BNP (last 3 results) No results for  input(s): PROBNP in the last 8760 hours. HbA1C: No results for input(s): HGBA1C in the last 72 hours. CBG: No results for input(s): GLUCAP in the last 168 hours. Lipid Profile: No results for input(s): CHOL, HDL, LDLCALC, TRIG, CHOLHDL, LDLDIRECT in the last 72 hours. Thyroid Function Tests: No results for input(s): TSH, T4TOTAL, FREET4, T3FREE, THYROIDAB in the last 72 hours. Anemia Panel: No results for input(s): VITAMINB12, FOLATE, FERRITIN, TIBC, IRON, RETICCTPCT in the last 72 hours. Urine analysis:    Component Value Date/Time   COLORURINE YELLOW 11/12/2015 [place] 11/12/2015 1143   LABSPEC 1.018 11/12/2015 1143   PHURINE 7.0 11/12/2015 1143   GLUCOSEU NEGATIVE 11/12/2015 1143   HGBUR NEGATIVE 11/12/2015 1143   BILIRUBINUR neg 02/06/2016 1621   BILIRUBINUR NEGATIVE 11/12/2015 1143   KETONESUR NEGATIVE 11/12/2015 1143   PROTEINUR neg 02/06/2016 1621   PROTEINUR NEGATIVE 11/12/2015 1143   UROBILINOGEN 0.2 02/06/2016 1621   UROBILINOGEN 0.2 11/08/2014 1910   NITRITE neg 02/06/2016 1621   NITRITE NEGATIVE 11/12/2015 1143   LEUKOCYTESUR Negative 02/06/2016 1621   Sepsis Labs: @LABRCNTIP (procalcitonin:4,lacticidven:4) )No results found for this or any previous visit (from the past 240 hour(s)).   Radiological Exams on Admission: Dg Knee Complete 4 Views Left  03/10/2016  CLINICAL DATA:  Unwitnessed fall while ambulating with walker. Fall in living room. Left hip and knee pain after fall. EXAM: LEFT KNEE - COMPLETE 4+ VIEW COMPARISON:  None. FINDINGS: No fracture or dislocation. The alignment and joint spaces are maintained. 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 No joint effusion. Small quadriceps tendon enthesophyte. Vascular calcifications are seen. IMPRESSION: No fracture or subluxation of the left knee. Electronically Signed   By: Jeb Levering M.D.   On: 03/10/2016 00:54   Dg Hip Unilat With Pelvis 2-3 Views Left  03/10/2016  CLINICAL DATA:   Unwitnessed fall while ambulating with walker. Fall in living room. Left hip and knee pain after fall. EXAM: DG HIP (WITH OR WITHOUT PELVIS) 2-3V LEFT COMPARISON:  None. FINDINGS: Linear sclerosis involving the subcapital left femoral neck, suspicious for impacted femoral neck fracture. Femoral head remains seated in the acetabulum. Fragmentation multiple lateral acetabulum is similar to prior abdominal radiograph from 2011 and appears chronic. No evidence pubic rami fracture. Multiple partially cannulated screws traverse the right proximal femur. IMPRESSION: Findings suspicious for impacted left femoral neck fracture. Electronically Signed   By: Jeb Levering M.D.   On: 03/10/2016 00:52    EKG: Independently reviewed.  Assessment/Plan Active Problems:   Femoral neck fracture, left, closed, initial encounter    Left femoral neck fx -  Reversing coumadin with Vit K  Repeat INR tomorrow AM  No surgery planned for today due to high INR (i spoke with Dr. Onnie Graham)  Letting patient eat diet  Hip fx pathway   DVT prophylaxis: INR >3 Code Status: Full Family Communication: Wife at bedside Consults called: Dr. Onnie Graham with ortho Admission status: Admit to inpatient   Etta Quill DO [facility] Pager [phone] from [facility]  If 7AM-7PM, please contact the day physician for the patient [email or web address] Password [email or web address]  03/10/2016, 3:23 AM

## 2016-03-10 NOTE — Telephone Encounter (Signed)
Pt wife called,wanted Dr. Darnell Level to know [place] fell and fractured his hip, will have surgery scheduled in the next few days.

## 2016-03-10 NOTE — ED Notes (Signed)
Patient transported to X-ray 

## 2016-03-10 NOTE — Consult Note (Signed)
PREOPERATIVE H&P  Chief Complaint: Left hip pain following mechanical fall  HPI: [name] is a 82 y.o. male who presents for evaluation of left hip pain after a fall tripping over his walker. He has significant balance issues and has had previous fractures of his right hip and C7 as result of falls. We are currently caring for his wife who is a retired NP from [employer]. He is on coumadin with last dose Saturday evening. He received IV vit K 5 mg at 4am.   Past Medical History  Diagnosis Date  . GERD (gastroesophageal reflux disease)     h/o PUD  . CAD (coronary artery disease)     cath 2000 30% single vessel, normal nuclear stress test 12/03/2010, no evidence ischemia  . CKD (chronic kidney disease) stage 3, GFR 30-59 ml/min     baseline Cr 1.7  . Depression   . Hx pulmonary embolism 08/1991    negative hypercoagulable panel 12/2014  . History of phlebitis   . Allergic rhinitis   . Urge incontinence   . Elevated PSA     previous-normalized (followed by Dr. Jeffie [name], rec no repeat unless urinary sxs)  . HLD (hyperlipidemia)     hypertriglyceridemia  . Asthma     remote  . Arthritis   . Blood transfusion 1990's  . Schatzki's ring 02/2012    s/p dilation [name])  . History of fracture of right hip   . Hearing loss 06/2012    eval - rec annual exam  . Essential tremor 09/27/2008    reviewed eval by Dr Jannifer Franklin in chart 2011   . Prediabetes   . DISH (diffuse idiopathic skeletal hyperostosis) 03/2013    lumbar spine on xray  . DDD (degenerative disc disease) 03/2013    by CT, diffuse multilevel cervical and lumbar spondylosis  . Osteoporosis 11/2013    DEXA T score -2.7  . Macrocytic anemia 2014    stable B12/folate and periph smear 05/2013, again periph smear 2017 with mature cells, mild dacrocytosis  . Sleep apnea     no CPAP- sleep apnea "cleared up 15 years ago"  . Dizziness     multifactorial, s/p PT at [facility] 06/2014 with HEP  . History of pyelonephritis 05/2014   hospitalization with sepsis  . Closed C7 fracture ([place]) 11/06/2014  . UTI (urinary tract infection) 05/13/2015  . UTI (urinary tract infection) due to Enterococcus 05/13/2015  . Candidal urethritis in male 06/15/2015  . Stroke [facility])    Past Surgical History  Procedure Laterality Date  . Tonsillectomy  1965  . Hernia repair  1979    Left  . Cystoscopy  1992    for kidney stones  . Exploratory laparotomy  1996    with incidental appendectomy  . Appendectomy  1996  . Knee arthroscopy  03/24/02    Right (Dr. Mauri Pole)  . Orif femoral neck fracture w/ dhs  08/03/02    Right (Dr. Mauri Pole)  . Ct abd w & pelvis wo cm  07/2001    Scarring of right lung, stable negative o/w  . Ct abd w & pelvis wo cm  11/2000    ? stones, right LL scarring  . V/q scan  06/1999    negative  . Colonoscopy  1998    [place]  . US echocardiography  12/28/2007    Mild aortic valve calcification EF 55%, basically nml  . Carotid u/s  12/28/2007    nml  . Eeg  11/12/2009  nml  . Mri  11/2009    Head, nml  . Cataract extraction  Jan, March 2011    bilateral  . Colonoscopy  07/2010    5 polyps, adenomatous, rec rpt 3 yrs  . Esophagogastroduodenoscopy  02/2012    dilation of schatzki's ring [name])  . Upper gastrointestinal endoscopy    . Colonoscopy  04/2014    3 polyps, adenomatous, f/u open ended given age [name])  . Cardiac catheterization  2000    30% one vessel  . Cardiovascular stress test  03/2015    no ischemia, low risk, EF 76%  . Esophagogastroduodenoscopy N/A 11/15/2015    Procedure: ESOPHAGOGASTRODUODENOSCOPY (EGD);  Surgeon: [name] Mayer, MD;  Location: [facility];  Service: Endoscopy;  Laterality: N/A;   Social History   Social History  . Marital Status: Married    Spouse Name: N/A  . Number of Children: 2  . Years of Education: N/A   Occupational History  . 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 Smoking status: Never Smoker   . Smokeless  tobacco: Never Used  . Alcohol Use: No  . Drug Use: No  . Sexual Activity: Not Asked   Other Topics Concern  . None   Social History Narrative   Married with 2 children   Husband of [name]    Retired: was principal    Activity: walks dog 2-3 times daily about 21min, frequent stops    Diet: some water, good fruits/vegetables, fish 1x/wk, no sodas.      Advanced directives: [name] is wife, [name]. Does not want prolonged life support if terminal      Patient does not drink caffeine.   Patient is right handed.    Family History  Problem Relation Age of Onset  . Stroke Mother   . Hypertension Mother   . Cancer Brother     prostate with mets  . Blindness Brother     legally  . Diabetes Brother   . Pulmonary embolism Sister     from shoulder operation  . Alcohol abuse Brother   . Colon cancer Neg Hx   . Esophageal cancer Neg Hx   . Rectal cancer Neg Hx   . Stomach cancer Neg Hx    Allergies  Allergen Reactions  . Lactose Intolerance (Gi)   . Nizatidine     REACTION: Rash (Axid)  . Sulfadiazine     REACTION: Hives  . Aricept [Donepezil Hcl] Rash  . Lipitor [Atorvastatin] Rash  . Penicillins Rash    Has patient had a PCN reaction causing immediate rash, facial/tongue/throat swelling, SOB or lightheadedness with hypotension: No Has patient had a PCN reaction causing severe rash involving mucus membranes or skin necrosis: No Has patient had a PCN reaction that required hospitalization No Has patient had a PCN reaction occurring within the last 10 years: No If all of the above answers are "NO",  then may proceed with Cephalosporin use.   Prior to Admission medications   Medication Sig Start Date End Date Taking? Authorizing Provider  albuterol (PROVENTIL HFA;VENTOLIN HFA) 108 (90 Base) MCG/ACT inhaler Inhale 2 puffs into the lungs every 6 (six) hours as needed for wheezing or shortness of breath. 01/10/16  Yes Ria Bush, MD  b complex vitamins tablet Take 1  tablet by mouth daily.   Yes Historical Provider, MD  Biotin 2500 MCG CAPS Take 1 capsule by mouth daily.   Yes Historical Provider, MD  Cholecalciferol (VITAMIN D) 2000 UNITS CAPS Take 1 capsule by mouth daily. Reported on 01/30/2016   Yes Historical Provider, MD  clotrimazole (LOTRIMIN) 1 % cream Apply 1 application topically 2 (two) times daily. Patient taking differently: Apply 1 application topically 2 (two) times daily as needed. For rash 05/02/15  Yes Pleas Koch, NP  Coenzyme Q10 (COQ-10) 100 MG CAPS Take 1 capsule by mouth daily.    Yes Historical Provider, MD  Cyanocobalamin (B-12 PO) Take 10,000 mcg by mouth every Monday, Wednesday, and Friday.    Yes Historical Provider, MD  dexlansoprazole (DEXILANT) 60 MG capsule Take 1 capsule (60 mg total) by mouth daily. 01/10/16  Yes Ria Bush, MD  docusate sodium (COLACE) 100 MG capsule Take 100 mg by mouth at bedtime.    Yes Historical Provider, MD  fenofibrate (TRICOR) 145 MG tablet Take 1 tablet (145 mg total) by mouth daily. 01/10/16  Yes Ria Bush, MD  fexofenadine (ALLEGRA) 180 MG tablet Take 180 mg by mouth daily.    Yes Historical Provider, MD  gabapentin (NEURONTIN) 100 MG capsule Take 2 capsules (200 mg total) by mouth 3 (three) times daily. Patient taking differently: Take 200-300 mg by mouth 2 (two) times daily. Take 3 capsules in the morning Take 2 capsules in the evening 09/21/15  Yes Kathrynn Ducking, MD  Multiple Vitamins-Minerals (MULTIVITAMIN PO) Take 1 tablet by mouth daily.    Yes Historical Provider, MD  simvastatin (ZOCOR) 40 MG tablet Take 1 tablet (40 mg total) by mouth daily. 01/10/16  Yes Ria Bush, MD  venlafaxine (EFFEXOR) 75 MG tablet Take 1 tablet (75 mg total) by mouth 2 (two) times daily with a meal. 01/10/16  Yes Ria Bush, MD  warfarin (COUMADIN) 2.5 MG tablet Take as directed by anti-coagulation clinic. Patient taking differently: Take 2.5-3.75 mg by mouth daily. Take 3.75 mg on Monday and  Friday then take 2.5 mg all the other days 12/06/15  Yes Ria Bush, MD       All other systems have been reviewed and were otherwise negative with the exception of those mentioned in the HPI and as above.  Physical Exam: Filed Vitals:   03/10/16 0345 03/10/16 0400  BP: 124/56 125/55  Pulse: 85 84  Temp:    Resp: 12 12    General: Alert, no acute distress Cardiovascular: No pedal edema Respiratory: No cyanosis, no use of accessory musculature GI: No organomegaly, abdomen is soft and non-tender Skin: No lesions in the area of chief complaint Neurologic: Sensation intact distally Psychiatric: Patient is competent for consent with normal mood and affect Lymphatic: No axillary or cervical lymphadenopathy  MUSCULOSKELETAL: left hip with painful rom but NVI distally  Assessment/Plan:  Plan for Left hip pinning when INR has normalized more to be performed by Dr. Lyla Glassing. Tentatively planned for tomorrow or wednseday.  The risks benefits and treatment options were reviewed including possible complications of bleeding, infection, neurovascular injury, ongoing pain, anesthetic complication, and possible need for additional surgery. The patient understands, accepts and agrees to proceed with the planned procedure.  Solangel Mcmanaway, PA-C 03/10/2016 8:18 AM Contact # [phone]

## 2016-03-10 NOTE — ED Notes (Signed)
Dr. Alcario Drought at bedside with patient & spouse at this time.

## 2016-03-10 NOTE — Progress Notes (Signed)
Patient seen and examined  82 y.o. male with medical history significant of R hip fx, CKD stage 3, prior PE and TIA on coumadin. Patient presents to the ED after a mechanical fall at home. This occurred just PTA, he tripped over his walker. [place] struck head on couch and carpeted floor. Reports pain to left thigh and knee. Unable to bear weight on hip. H/o prior R hip fx and C7 fx from falls. Pain better with fentanyl.  Assessment and plan Left hip fracture Left hip pinning when INR has normalized more to be performed by Dr. Lyla Glassing. Tentatively planned for tomorrow or wednseday Monitor INR  History of pulmonary embolism Start Lovenox for DVT prophylaxis   when INR less than 2.0 Resume Coumadin after surgery  Gastroesophageal reflux disease-continue PPI  Depression continue Effexor

## 2016-03-11 ENCOUNTER — Inpatient Hospital Stay (HOSPITAL_COMMUNITY): Payer: Medicare Other

## 2016-03-11 ENCOUNTER — Inpatient Hospital Stay (HOSPITAL_COMMUNITY): Payer: Medicare Other | Admitting: Anesthesiology

## 2016-03-11 ENCOUNTER — Encounter (HOSPITAL_COMMUNITY)
Admission: EM | Disposition: A | Discharge status: Skilled Nursing Facility | Payer: Self-pay | Source: Home / Self Care | Attending: Internal Medicine

## 2016-03-11 DIAGNOSIS — S72002B Fracture of unspecified part of neck of left femur, initial encounter for open fracture type I or II: Secondary | ICD-10-CM

## 2016-03-11 DIAGNOSIS — S72002A Fracture of unspecified part of neck of left femur, initial encounter for closed fracture: Principal | ICD-10-CM

## 2016-03-11 DIAGNOSIS — T148 Other injury of unspecified body region: Secondary | ICD-10-CM

## 2016-03-11 HISTORY — PX: HIP PINNING,CANNULATED: SHX1758

## 2016-03-11 LAB — COMPREHENSIVE METABOLIC PANEL
ALT: 38 U/L (ref 17–63)
AST: 41 U/L (ref 15–41)
Albumin: 2.8 g/dL — ABNORMAL LOW (ref 3.5–5.0)
Alkaline Phosphatase: 31 U/L — ABNORMAL LOW (ref 38–126)
Anion gap: 5 (ref 5–15)
BILIRUBIN TOTAL: 1.6 mg/dL — AB (ref 0.3–1.2)
BUN: 19 mg/dL (ref 6–20)
CHLORIDE: 107 mmol/L (ref 101–111)
CO2: 23 mmol/L (ref 22–32)
CREATININE: 1.59 mg/dL — AB (ref 0.61–1.24)
Calcium: 8.4 mg/dL — ABNORMAL LOW (ref 8.9–10.3)
GFR, EST AFRICAN AMERICAN: 45 mL/min — AB (ref >60)
GFR, EST NON AFRICAN AMERICAN: 39 mL/min — AB (ref >60)
Glucose, Bld: 121 mg/dL — ABNORMAL HIGH (ref 65–99)
POTASSIUM: 4.7 mmol/L (ref 3.5–5.1)
Sodium: 135 mmol/L (ref 135–145)
TOTAL PROTEIN: 5 g/dL — AB (ref 6.5–8.1)

## 2016-03-11 LAB — CBC
HEMATOCRIT: 30.1 % — AB (ref 39.0–52.0)
Hemoglobin: 9.9 g/dL — ABNORMAL LOW (ref 13.0–17.0)
MCH: 37.1 pg — AB (ref 26.0–34.0)
MCHC: 32.9 g/dL (ref 30.0–36.0)
MCV: 112.7 fL — AB (ref 78.0–100.0)
PLATELETS: 101 10*3/uL — AB (ref 150–400)
RBC: 2.67 MIL/uL — ABNORMAL LOW (ref 4.22–5.81)
RDW: 14.4 % (ref 11.5–15.5)
WBC: 6.8 10*3/uL (ref 4.0–10.5)

## 2016-03-11 LAB — PROTIME-INR
INR: 1.54 — ABNORMAL HIGH (ref 0.00–1.49)
Prothrombin Time: 18.5 seconds — ABNORMAL HIGH (ref 11.6–15.2)

## 2016-03-11 SURGERY — FIXATION, FEMUR, NECK, PERCUTANEOUS, USING SCREW
Anesthesia: General | Laterality: Left

## 2016-03-11 MED ORDER — ONDANSETRON HCL 4 MG PO TABS: 4.0000 mg | ORAL_TABLET | Freq: Four times a day (QID) | ORAL | Status: DC | PRN

## 2016-03-11 MED ORDER — LIDOCAINE HCL (CARDIAC) 20 MG/ML IV SOLN
INTRAVENOUS | Status: DC | PRN
  Administered 2016-03-11: 60 mg via INTRAVENOUS

## 2016-03-11 MED ORDER — ROCURONIUM BROMIDE 50 MG/5ML IV SOLN
INTRAVENOUS | Status: AC
  Filled 2016-03-11: qty 1

## 2016-03-11 MED ORDER — ENOXAPARIN SODIUM 40 MG/0.4ML ~~LOC~~ SOLN: 40.0000 mg | SUBCUTANEOUS | Status: DC

## 2016-03-11 MED ORDER — FENTANYL CITRATE (PF) 100 MCG/2ML IJ SOLN
INTRAMUSCULAR | Status: DC | PRN
  Administered 2016-03-11: 25 ug via INTRAVENOUS
  Administered 2016-03-11: 50 ug via INTRAVENOUS

## 2016-03-11 MED ORDER — ONDANSETRON HCL 4 MG/2ML IJ SOLN
INTRAMUSCULAR | Status: AC
  Filled 2016-03-11: qty 2

## 2016-03-11 MED ORDER — HYDROMORPHONE HCL 1 MG/ML IJ SOLN: 0.2500 mg | INTRAMUSCULAR | Status: DC | PRN

## 2016-03-11 MED ORDER — ACETAMINOPHEN 325 MG PO TABS
650.0000 mg | ORAL_TABLET | Freq: Four times a day (QID) | ORAL | Status: DC | PRN
  Administered 2016-03-12: 650 mg via ORAL
  Filled 2016-03-11: qty 2

## 2016-03-11 MED ORDER — CLINDAMYCIN PHOSPHATE 900 MG/50ML IV SOLN
INTRAVENOUS | Status: AC
  Filled 2016-03-11: qty 50

## 2016-03-11 MED ORDER — PROPOFOL 10 MG/ML IV BOLUS
INTRAVENOUS | Status: AC
  Filled 2016-03-11: qty 20

## 2016-03-11 MED ORDER — ONDANSETRON HCL 4 MG/2ML IJ SOLN: 4.0000 mg | Freq: Four times a day (QID) | INTRAMUSCULAR | Status: DC | PRN

## 2016-03-11 MED ORDER — FENTANYL CITRATE (PF) 250 MCG/5ML IJ SOLN
INTRAMUSCULAR | Status: AC
  Filled 2016-03-11: qty 5

## 2016-03-11 MED ORDER — SORBITOL 70 % SOLN: 30.0000 mL | Freq: Every day | Status: DC | PRN

## 2016-03-11 MED ORDER — ACETAMINOPHEN 650 MG RE SUPP: 650.0000 mg | Freq: Four times a day (QID) | RECTAL | Status: DC | PRN

## 2016-03-11 MED ORDER — WARFARIN SODIUM 5 MG PO TABS
2.5000 mg | ORAL_TABLET | Freq: Once | ORAL | Status: AC
  Administered 2016-03-11: 2.5 mg via ORAL
  Filled 2016-03-11: qty 1

## 2016-03-11 MED ORDER — PROMETHAZINE HCL 25 MG/ML IJ SOLN: 6.2500 mg | INTRAMUSCULAR | Status: DC | PRN

## 2016-03-11 MED ORDER — METOCLOPRAMIDE HCL 5 MG PO TABS: 5.0000 mg | ORAL_TABLET | Freq: Three times a day (TID) | ORAL | Status: DC | PRN

## 2016-03-11 MED ORDER — LACTATED RINGERS IV SOLN
INTRAVENOUS | Status: DC | PRN
  Administered 2016-03-11 (×2): via INTRAVENOUS

## 2016-03-11 MED ORDER — ENOXAPARIN SODIUM 40 MG/0.4ML ~~LOC~~ SOLN
40.0000 mg | SUBCUTANEOUS | Status: DC
  Administered 2016-03-11 – 2016-03-12 (×2): 40 mg via SUBCUTANEOUS
  Filled 2016-03-11 (×2): qty 0.4

## 2016-03-11 MED ORDER — MAGNESIUM CITRATE PO SOLN: 1.0000 | Freq: Once | ORAL | Status: DC | PRN

## 2016-03-11 MED ORDER — PHENOL 1.4 % MT LIQD
1.0000 | OROMUCOSAL | Status: DC | PRN
Start: 2016-03-11 — End: 2016-03-13

## 2016-03-11 MED ORDER — METHOCARBAMOL 1000 MG/10ML IJ SOLN
500.0000 mg | Freq: Four times a day (QID) | INTRAVENOUS | Status: DC | PRN
  Filled 2016-03-11: qty 5

## 2016-03-11 MED ORDER — 0.9 % SODIUM CHLORIDE (POUR BTL) OPTIME
TOPICAL | Status: DC | PRN
  Administered 2016-03-11: 100 mL

## 2016-03-11 MED ORDER — ONDANSETRON HCL 4 MG/2ML IJ SOLN
INTRAMUSCULAR | Status: DC | PRN
  Administered 2016-03-11: 4 mg via INTRAVENOUS

## 2016-03-11 MED ORDER — SODIUM CHLORIDE 0.9 % IV SOLN
INTRAVENOUS | Status: DC
  Administered 2016-03-11: 19:00:00 via INTRAVENOUS

## 2016-03-11 MED ORDER — PHENYLEPHRINE HCL 10 MG/ML IJ SOLN
10.0000 mg | INTRAVENOUS | Status: DC | PRN
  Administered 2016-03-11: 40 ug/min via INTRAVENOUS

## 2016-03-11 MED ORDER — MENTHOL 3 MG MT LOZG: 1.0000 | LOZENGE | OROMUCOSAL | Status: DC | PRN

## 2016-03-11 MED ORDER — PHENYLEPHRINE 40 MCG/ML (10ML) SYRINGE FOR IV PUSH (FOR BLOOD PRESSURE SUPPORT)
PREFILLED_SYRINGE | INTRAVENOUS | Status: AC
  Filled 2016-03-11: qty 10

## 2016-03-11 MED ORDER — LIDOCAINE 2% (20 MG/ML) 5 ML SYRINGE
INTRAMUSCULAR | Status: AC
  Filled 2016-03-11: qty 5

## 2016-03-11 MED ORDER — CLINDAMYCIN PHOSPHATE 600 MG/50ML IV SOLN
600.0000 mg | Freq: Four times a day (QID) | INTRAVENOUS | Status: AC
Start: 2016-03-11 — End: 2016-03-11
  Administered 2016-03-11 (×2): 600 mg via INTRAVENOUS
  Filled 2016-03-11 (×2): qty 50

## 2016-03-11 MED ORDER — PHENYLEPHRINE HCL 10 MG/ML IJ SOLN
INTRAMUSCULAR | Status: DC | PRN
  Administered 2016-03-11: 80 ug via INTRAVENOUS

## 2016-03-11 MED ORDER — METHOCARBAMOL 500 MG PO TABS
500.0000 mg | ORAL_TABLET | Freq: Four times a day (QID) | ORAL | Status: DC | PRN
  Administered 2016-03-11: 500 mg via ORAL
  Filled 2016-03-11 (×2): qty 1

## 2016-03-11 MED ORDER — METOCLOPRAMIDE HCL 5 MG/ML IJ SOLN: 5.0000 mg | Freq: Three times a day (TID) | INTRAMUSCULAR | Status: DC | PRN

## 2016-03-11 MED ORDER — PROPOFOL 10 MG/ML IV BOLUS
INTRAVENOUS | Status: DC | PRN
  Administered 2016-03-11: 60 mg via INTRAVENOUS

## 2016-03-11 MED ORDER — WARFARIN - PHARMACIST DOSING INPATIENT: Freq: Every day | Status: DC

## 2016-03-11 MED ORDER — SUCCINYLCHOLINE CHLORIDE 20 MG/ML IJ SOLN
INTRAMUSCULAR | Status: DC | PRN
  Administered 2016-03-11: 100 mg via INTRAVENOUS

## 2016-03-11 SURGICAL SUPPLY — 41 items
BIT DRILL 4.8X200 CANN (BIT) ×2 IMPLANT
CHLORAPREP W/TINT 26ML (MISCELLANEOUS) ×2 IMPLANT
COVER PERINEAL POST (MISCELLANEOUS) ×2 IMPLANT
COVER SURGICAL LIGHT HANDLE (MISCELLANEOUS) ×2 IMPLANT
DERMABOND ADVANCED (GAUZE/BANDAGES/DRESSINGS) ×2
DERMABOND ADVANCED .7 DNX12 (GAUZE/BANDAGES/DRESSINGS) ×2 IMPLANT
DRAPE C-ARM 42X72 X-RAY (DRAPES) ×2 IMPLANT
DRAPE C-ARMOR (DRAPES) ×2 IMPLANT
DRAPE IMP U-DRAPE 54X76 (DRAPES) ×4 IMPLANT
DRAPE STERI IOBAN 125X83 (DRAPES) ×2 IMPLANT
DRAPE U-SHAPE 47X51 STRL (DRAPES) ×4 IMPLANT
DRSG MEPILEX BORDER 4X4 (GAUZE/BANDAGES/DRESSINGS) ×2 IMPLANT
DRSG PAD ABDOMINAL 8X10 ST (GAUZE/BANDAGES/DRESSINGS) ×2 IMPLANT
ELECT REM PT RETURN 9FT ADLT (ELECTROSURGICAL) ×2
ELECTRODE REM PT RTRN 9FT ADLT (ELECTROSURGICAL) ×1 IMPLANT
GLOVE BIO SURGEON STRL SZ 6.5 (GLOVE) ×2 IMPLANT
GLOVE BIO SURGEON STRL SZ8.5 (GLOVE) ×4 IMPLANT
GLOVE BIOGEL PI IND STRL 7.0 (GLOVE) ×1 IMPLANT
GLOVE BIOGEL PI IND STRL 8.5 (GLOVE) ×1 IMPLANT
GLOVE BIOGEL PI INDICATOR 7.0 (GLOVE) ×1
GLOVE BIOGEL PI INDICATOR 8.5 (GLOVE) ×1
GLOVE SURG SS PI 7.0 STRL IVOR (GLOVE) ×2 IMPLANT
GOWN STRL REUS W/ TWL LRG LVL3 (GOWN DISPOSABLE) ×1 IMPLANT
GOWN STRL REUS W/TWL 2XL LVL3 (GOWN DISPOSABLE) ×2 IMPLANT
GOWN STRL REUS W/TWL LRG LVL3 (GOWN DISPOSABLE) ×1
KIT ROOM TURNOVER OR (KITS) ×2 IMPLANT
MANIFOLD NEPTUNE II (INSTRUMENTS) ×2 IMPLANT
MARKER SKIN DUAL TIP RULER LAB (MISCELLANEOUS) ×2 IMPLANT
NS IRRIG 1000ML POUR BTL (IV SOLUTION) ×4 IMPLANT
PACK GENERAL/GYN (CUSTOM PROCEDURE TRAY) ×2 IMPLANT
PAD ARMBOARD 7.5X6 YLW CONV (MISCELLANEOUS) ×4 IMPLANT
PIN GUIDE DRILL TIP 2.8X300 (DRILL) ×6 IMPLANT
SCREW 8.0X80MMX16 (Screw) ×2 IMPLANT
SCREW CANN 8.0X85 HIP (Screw) ×6 IMPLANT
SUT MNCRL AB 3-0 PS2 27 (SUTURE) ×2 IMPLANT
SUT MON AB 2-0 CT1 27 (SUTURE) ×2 IMPLANT
SUT VIC AB 1 CT1 27 (SUTURE) ×1
SUT VIC AB 1 CT1 27XBRD ANBCTR (SUTURE) ×1 IMPLANT
TOWEL OR 17X24 6PK STRL BLUE (TOWEL DISPOSABLE) ×2 IMPLANT
TOWEL OR 17X26 10 PK STRL BLUE (TOWEL DISPOSABLE) ×2 IMPLANT
TRAY FOLEY CATH SILVER 16FR LF (SET/KITS/TRAYS/PACK) ×2 IMPLANT

## 2016-03-11 NOTE — Discharge Instructions (Signed)
Dr. Rod Can Adult Hip & Knee Specialist [facility] [address], [place], [place] [address] [phone]   POSTOPERATIVE DIRECTIONS    Hip Rehabilitation, Guidelines Following Surgery   WEIGHT BEARING Partial weight bearing with assist device as directed.  50% left leg with walker   [place] items at home which could result in a fall. This includes throw rugs or furniture in walking pathways.  Continue medications as instructed at time of discharge.  You may have some home medications which will be placed on hold until you complete the course of blood thinner medication.  4 days after discharge, you may start showering. No tub baths or soaking your incisions. Do not put on socks or shoes without following the instructions of your caregivers.   Sit on chairs with arms. Use the chair arms to help push yourself up when arising.  Arrange for the use of a toilet seat elevator so you are not sitting low.   Walk with walker as instructed.  You may resume a sexual relationship in one month or when given the OK by your caregiver.  Use walker as long as suggested by your caregivers.  Avoid periods of inactivity such as sitting longer than an hour when not asleep. This helps prevent blood clots.  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  Do not drive a car for 6 weeks or until released by your surgeon.  Do not drive while taking narcotics.  Wear elastic stockings for two weeks following surgery during the day but you may remove then at night.  Make sure you keep all of your appointments after your operation with all of your doctors and caregivers. You should call the office at the above phone number and make an appointment for approximately two weeks after the date of your surgery. Please pick up a stool softener and laxative for home use as long as you are requiring pain medications.  ICE to the affected hip  every three hours for 30 minutes at a time and then as needed for pain and swelling. Continue to use ice on the hip for pain and swelling from surgery. You may notice swelling that will progress down to the foot and ankle.  This is normal after surgery.  Elevate the leg when you are not up walking on it.   It is important for you to complete the blood thinner medication as prescribed by your doctor.  Continue to use the breathing machine which will help keep your temperature down.  It is common for your temperature to cycle up and down following surgery, especially at night when you are not up moving around and exerting yourself.  The breathing machine keeps your lungs expanded and your temperature down.  RANGE OF MOTION AND STRENGTHENING EXERCISES  These exercises are designed to help you keep full movement of your hip joint. Follow your caregiver's or physical therapist's instructions. Perform all exercises about fifteen times, three times per day or as directed. Exercise both hips, even if you have had only one joint replacement. These exercises can be done on a training (exercise) mat, on the floor, on a table or on a bed. Use whatever works the best and is most comfortable for you. Use music or television while you are exercising so that the exercises are a pleasant break in your day. This will make your life better with the exercises acting as a break in routine you  can look forward to.  Lying on your back, slowly slide your foot toward your buttocks, raising your knee up off the floor. Then slowly slide your foot back down until your leg is straight again.  Lying on your back spread your legs as far apart as you can without causing discomfort.  Lying on your side, raise your upper leg and foot straight up from the floor as far as is comfortable. Slowly lower the leg and repeat.  Lying on your back, tighten up the muscle in the front of your thigh (quadriceps muscles). You can do this by keeping your  leg straight and trying to raise your heel off the floor. This helps strengthen the largest muscle supporting your knee.  Lying on your back, tighten up the muscles of your buttocks both with the legs straight and with the knee bent at a comfortable angle while keeping your heel on the floor.   SKILLED REHAB INSTRUCTIONS: If the patient is transferred to a skilled rehab facility following release from the hospital, a list of the current medications will be sent to the facility for the patient to continue.  When discharged from the skilled rehab facility, please have the facility set up the patient's [place] prior to being released. Also, the skilled facility will be responsible for providing the patient with their medications at time of release from the facility to include their pain medication and their blood thinner medication. If the patient is still at the rehab facility at time of the two week follow up appointment, the skilled rehab facility will also need to assist the patient in arranging follow up appointment in our office and any transportation needs.  MAKE SURE YOU:  Understand these instructions.  Will watch your condition.  Will get help right away if you are not doing well or get worse.  Pick up stool softner and laxative for home use following surgery while on pain medications. Daily dry dressing changes as needed. In 4 days, you may remove your dressings and begin taking showers - no tub baths or soaking the incisions. Continue to use ice for pain and swelling after surgery. Do not use any lotions or creams on the incision until instructed by your surgeon.  Information on my medicine - Coumadin   (Warfarin)  This medication education was reviewed with me or my healthcare representative as part of my discharge preparation.  The pharmacist that spoke with me during my hospital stay was:  Kem Parkinson, [place]  Why was Coumadin prescribed for you? Coumadin was  prescribed for you because you have a blood clot or a medical condition that can cause an increased risk of forming blood clots. Blood clots can cause serious health problems by blocking the flow of blood to the heart, lung, or brain. Coumadin can prevent harmful blood clots from forming. As a reminder your indication for Coumadin is:   Pulmonary Embolism Treatment  What test will check on my response to Coumadin? While on Coumadin (warfarin) you will need to have an INR test regularly to ensure that your dose is keeping you in the desired range. The INR (international normalized ratio) number is calculated from the result of the laboratory test called prothrombin time (PT).  If an INR APPOINTMENT HAS NOT ALREADY BEEN MADE FOR YOU please schedule an appointment to have this lab work done by your health care provider within 7 days. Your INR goal is usually a number between:  2 to 3  or your provider may give you a more narrow range like 2-2.5.  Ask your health care provider during an office visit what your goal INR is.  What  do you need to  know  About  COUMADIN? Take Coumadin (warfarin) exactly as prescribed by your healthcare provider about the same time each day.  DO NOT stop taking without talking to the doctor who prescribed the medication.  Stopping without other blood clot prevention medication to take the place of Coumadin may increase your risk of developing a new clot or stroke.  Get refills before you run out.  What do you do if you miss a dose? If you miss a dose, take it as soon as you remember on the same day then continue your regularly scheduled regimen the next day.  Do not take two doses of Coumadin at the same time.  Important Safety Information A possible side effect of Coumadin (Warfarin) is an increased risk of bleeding. You should call your healthcare provider right away if you experience any of the following: ? Bleeding from an injury or your nose that does not  stop. ? Unusual colored urine (red or dark brown) or unusual colored stools (red or black). ? Unusual bruising for unknown reasons. ? A serious fall or if you hit your head (even if there is no bleeding).  Some foods or medicines interact with Coumadin (warfarin) and might alter your response to warfarin. To help avoid this: ? Eat a balanced diet, maintaining a consistent amount of Vitamin K. ? Notify your provider about major diet changes you plan to make. ? Avoid alcohol or limit your intake to 1 drink for women and 2 drinks for men per day. (1 drink is 5 oz. wine, 12 oz. beer, or 1.5 oz. liquor.)  Make sure that ANY health care provider who prescribes medication for you knows that you are taking Coumadin (warfarin).  Also make sure the healthcare provider who is monitoring your Coumadin knows when you have started a new medication including herbals and non-prescription products.  Coumadin (Warfarin)  Major Drug Interactions  Increased Warfarin Effect Decreased Warfarin Effect  Alcohol (large quantities) Antibiotics (esp. Septra/Bactrim, Flagyl, Cipro) Amiodarone (Cordarone) Aspirin (ASA) Cimetidine (Tagamet) Megestrol (Megace) NSAIDs (ibuprofen, naproxen, etc.) Piroxicam (Feldene) Propafenone (Rythmol SR) Propranolol (Inderal) Isoniazid (INH) Posaconazole (Noxafil) Barbiturates (Phenobarbital) Carbamazepine (Tegretol) Chlordiazepoxide (Librium) Cholestyramine (Questran) Griseofulvin Oral Contraceptives Rifampin Sucralfate (Carafate) Vitamin K   Coumadin (Warfarin) Major Herbal Interactions  Increased Warfarin Effect Decreased Warfarin Effect  Garlic Ginseng Ginkgo biloba Coenzyme Q10 Green tea St. Johns wort    Coumadin (Warfarin) FOOD Interactions  Eat a consistent number of servings per week of foods HIGH in Vitamin K (1 serving =  cup)  Collards (cooked, or boiled & drained) Kale (cooked, or boiled & drained) Mustard greens (cooked, or boiled &  drained) Parsley *serving size only =  cup Spinach (cooked, or boiled & drained) Swiss chard (cooked, or boiled & drained) Turnip greens (cooked, or boiled & drained)  Eat a consistent number of servings per week of foods MEDIUM-HIGH in Vitamin K (1 serving = 1 cup)  Asparagus (cooked, or boiled & drained) Broccoli (cooked, boiled & drained, or raw & chopped) Brussel sprouts (cooked, or boiled & drained) *serving size only =  cup Lettuce, raw (green leaf, endive, romaine) Spinach, raw Turnip greens, raw & chopped   These websites have more information on Coumadin (warfarin):  FailFactory.se; VeganReport.com.au;

## 2016-03-11 NOTE — Anesthesia Procedure Notes (Signed)
Procedure Name: Intubation Date/Time: 03/11/2016 7:50 AM Performed by: Izora Gala Pre-anesthesia Checklist: Patient identified, Emergency Drugs available, Suction available and Patient being monitored Patient Re-evaluated:Patient Re-evaluated prior to inductionOxygen Delivery Method: Circle system utilized Preoxygenation: Pre-oxygenation with 100% oxygen Intubation Type: IV induction Ventilation: Mask ventilation without difficulty Laryngoscope Size: Glidescope and 3 Grade View: Grade I Tube type: Oral Number of attempts: 1 Airway Equipment and Method: LTA kit utilized and Video-laryngoscopy Placement Confirmation: ETT inserted through vocal cords under direct vision,  positive ETCO2 and breath sounds checked- equal and bilateral Secured at: 22 cm Tube secured with: Tape

## 2016-03-11 NOTE — Progress Notes (Addendum)
ANTICOAGULATION CONSULT NOTE - Initial Consult  Pharmacy Consult for Warfarin/Lovenox Indication: pulmonary embolus and VTE prophylaxis  Allergies  Allergen Reactions  . Lactose Intolerance (Gi)   . Nizatidine     REACTION: Rash (Axid)  . Sulfadiazine     REACTION: Hives  . Aricept [Donepezil Hcl] Rash  . Lipitor [Atorvastatin] Rash  . Penicillins Rash    Has patient had a PCN reaction causing immediate rash, facial/tongue/throat swelling, SOB or lightheadedness with hypotension: No Has patient had a PCN reaction causing severe rash involving mucus membranes or skin necrosis: No Has patient had a PCN reaction that required hospitalization No Has patient had a PCN reaction occurring within the last 10 years: No If all of the above answers are "NO",  then may proceed with Cephalosporin use.    Patient Measurements: Height: 5\' 6"  (167.6 cm) Weight: 160 lb (72.576 kg) IBW/kg (Calculated) : 63.8   Vital Signs: Temp: 97.9 F (36.6 C) (07/04 1004) Temp Source: Oral (07/04 1004) BP: 122/59 mmHg (07/04 1004) Pulse Rate: 78 (07/04 1004)  Labs:  Recent Labs  03/10/16 0022 03/10/16 0058 03/10/16 1111 03/11/16 0258  HGB 10.8* 11.2*  --  9.9*  HCT 32.8* 33.0*  --  30.1*  PLT 118*  --   --  101*  LABPROT 31.1*  --  21.3* 18.5*  INR 3.07*  --  1.85* 1.54*  CREATININE 1.50* 1.50*  --  1.59*    Estimated Creatinine Clearance: 32.3 mL/min (by C-G formula based on Cr of 1.59).   Medical History: Past Medical History  Diagnosis Date  . GERD (gastroesophageal reflux disease)     h/o PUD  . CAD (coronary artery disease)     cath 2000 30% single vessel, normal nuclear stress test 12/03/2010, no evidence ischemia  . CKD (chronic kidney disease) stage 3, GFR 30-59 ml/min     baseline Cr 1.7  . Depression   . Hx pulmonary embolism 08/1991    negative hypercoagulable panel 12/2014  . History of phlebitis   . Allergic rhinitis   . Urge incontinence   . Elevated PSA    previous-normalized (followed by Dr. Jeffie Pollock, rec no repeat unless urinary sxs)  . HLD (hyperlipidemia)     hypertriglyceridemia  . Asthma     remote  . Arthritis   . Blood transfusion 1990's  . Schatzki's ring 02/2012    s/p dilation [name]  . History of fracture of right hip   . Hearing loss 06/2012    eval - rec annual exam  . Essential tremor 09/27/2008    reviewed eval by Dr Jannifer Franklin in chart 2011   . Prediabetes   . DISH (diffuse idiopathic skeletal hyperostosis) 03/2013    lumbar spine on xray  . DDD (degenerative disc disease) 03/2013    by CT, diffuse multilevel cervical and lumbar spondylosis  . Osteoporosis 11/2013    DEXA T score -2.7  . Macrocytic anemia 2014    stable B12/folate and periph smear 05/2013, again periph smear 2017 with mature cells, mild dacrocytosis  . Sleep apnea     no CPAP- sleep apnea "cleared up 15 years ago"  . Dizziness     multifactorial, s/p PT at [facility] 06/2014 with HEP  . History of pyelonephritis 05/2014    hospitalization with sepsis  . Closed C7 fracture ([place]) 11/06/2014  . UTI (urinary tract infection) 05/13/2015  . UTI (urinary tract infection) due to Enterococcus 05/13/2015  . Candidal urethritis in male 06/15/2015  . Stroke (  [place])     Medications:  Scheduled:  . clindamycin      . clindamycin (CLEOCIN) IV  600 mg Intravenous Q6H  . docusate sodium  100 mg Oral QHS  . enoxaparin (LOVENOX) injection  40 mg Subcutaneous Q24H  . fenofibrate  160 mg Oral Daily  . gabapentin  200 mg Oral QHS  . gabapentin  300 mg Oral Daily  . loratadine  10 mg Oral Daily  . pantoprazole  40 mg Oral Daily  . simvastatin  40 mg Oral Daily  . venlafaxine  75 mg Oral BID WC   Infusions:  . sodium chloride     PRN: acetaminophen **OR** acetaminophen, albuterol, HYDROcodone-acetaminophen, magnesium citrate, menthol-cetylpyridinium **OR** phenol, methocarbamol **OR** methocarbamol (ROBAXIN)  IV, metoCLOPramide **OR** metoCLOPramide (REGLAN) injection, morphine  injection, ondansetron **OR** ondansetron (ZOFRAN) IV, sorbitol  Assessment: 84 YOM on warfarin PTA for hx PE and TIA, s/p vit K 5 mg on 7/3. Warfarin held and started on lovenox (ppx dose) on 7/3 for VTE ppx when INR <2. Now to restart Warfarin.   INR currently subtherapeutic at 1.54, Hgb 9.9, Plts 101, no bleeding noted.   PTA Warfarin Dose: Take 3.75 mg on Monday and Friday then take 2.5 mg all the other days (Last Dose 7/1)  Goal of Therapy:  INR 2-3 Monitor platelets by anticoagulation protocol: Yes   Plan:  Warfarin 2.5 mg x 1 tonight Lovenox 40 mg daily (VTE prophylaxis dose per consult) when INR <2   Daily INR, CBC Monitor for s/sx of bleeding    Bennye Alm, PharmD Pharmacy Resident [phone]

## 2016-03-11 NOTE — Interval H&P Note (Signed)
History and Physical Interval Note:  03/11/2016 7:36 AM  [name]  has presented today for surgery, with the diagnosis of LEFT FEMUR FRACTURE  The various methods of treatment have been discussed with the patient and family. After consideration of risks, benefits and other options for treatment, the patient has consented to  Procedure(s): CANNULATED HIP PINNING (Left) as a surgical intervention .  The patient's history has been reviewed, patient examined, no change in status, stable for surgery.  I have reviewed the patient's chart and labs.  Questions were answered to the patient's satisfaction.     Adair Lemar, Horald Pollen

## 2016-03-11 NOTE — Brief Op Note (Signed)
03/11/2016  8:56 AM  PATIENT:  [name]  82 y.o. male  PRE-OPERATIVE DIAGNOSIS:  LEFT FEMUR FRACTURE  POST-OPERATIVE DIAGNOSIS:  LEFT FEMUR FRACTURE  PROCEDURE:  Procedure(s): CANNULATED HIP PINNING (Left)  SURGEON:  Surgeon(s) and Role:    * Rod Can, MD - Primary  PHYSICIAN ASSISTANT:   ASSISTANTS: none   ANESTHESIA:   general  EBL:  Total I/O In: 1000 [I.V.:1000] Out: 50 [Blood:50]  BLOOD ADMINISTERED:none  DRAINS: none   LOCAL MEDICATIONS USED:  NONE  SPECIMEN:  No Specimen  DISPOSITION OF SPECIMEN:  N/A  COUNTS:  YES  TOURNIQUET:  * No tourniquets in log *  DICTATION: .Other Dictation: Dictation Number [id number]  PLAN OF CARE: Admit to inpatient   PATIENT DISPOSITION:  PACU - hemodynamically stable.   Delay start of Pharmacological VTE agent (>24hrs) due to surgical blood loss or risk of bleeding: no

## 2016-03-11 NOTE — Anesthesia Preprocedure Evaluation (Addendum)
Anesthesia Evaluation  Patient identified by MRN, date of birth, ID band Patient awake    Reviewed: Allergy & Precautions, NPO status , Patient's Chart, lab work & pertinent test results  Airway Mallampati: III  TM Distance: <3 FB Neck ROM: Limited    Dental no notable dental hx.    Pulmonary neg pulmonary ROS,    Pulmonary exam normal breath sounds clear to auscultation       Cardiovascular Normal cardiovascular exam Rhythm:Regular Rate:Normal  Left ventricle: The cavity size was normal. Wall thickness was normal. Systolic function was vigorous. The estimated ejection fraction was in the range of 65% to 70%. Wall motion was normal; there were no regional wall motion abnormalities. Doppler parameters are consistent with abnormal left ventricular relaxation (grade 1 diastolic dysfunction). The E/e&' ratio is between 8-15, suggesting indeterminate LV filling pressure. - Aortic valve: Trileaflet. Sclerosis without stenosis. There was trivial to mild regurgitation. - Mitral valve: Calcified annulus. - Left atrium: The atrium was normal in size.    Neuro/Psych CVA, No Residual Symptoms negative psych ROS   GI/Hepatic Neg liver ROS, GERD  ,  Endo/Other  negative endocrine ROS  Renal/GU Renal InsufficiencyRenal disease  negative genitourinary   Musculoskeletal negative musculoskeletal ROS (+)   Abdominal   Peds negative pediatric ROS (+)  Hematology Chronic coumadin   Anesthesia Other Findings   Reproductive/Obstetrics negative OB ROS                           Anesthesia Physical Anesthesia Plan  ASA: III  Anesthesia Plan: General   Post-op Pain Management:    Induction: Intravenous  Airway Management Planned: Oral ETT and Video Laryngoscope Planned  Additional Equipment:   Intra-op Plan:   Post-operative Plan: Extubation in OR  Informed Consent: I have reviewed the  patients History and Physical, chart, labs and discussed the procedure including the risks, benefits and alternatives for the proposed anesthesia with the patient or authorized representative who has indicated his/her understanding and acceptance.   Dental advisory given  Plan Discussed with: CRNA and Surgeon  Anesthesia Plan Comments:        Anesthesia Quick Evaluation

## 2016-03-11 NOTE — H&P (View-Only) (Signed)
PREOPERATIVE H&P  Chief Complaint: Left hip pain following mechanical fall  HPI: [name] is a 82 y.o. male who presents for evaluation of left hip pain after a fall tripping over his walker. He has significant balance issues and has had previous fractures of his right hip and C7 as result of falls. We are currently caring for his wife who is a retired NP from [place]. He is on coumadin with last dose Saturday evening. He received IV vit K 5 mg at 4am.   Past Medical History  Diagnosis Date  . GERD (gastroesophageal reflux disease)     h/o PUD  . CAD (coronary artery disease)     cath 2000 30% single vessel, normal nuclear stress test 12/03/2010, no evidence ischemia  . CKD (chronic kidney disease) stage 3, GFR 30-59 ml/min     baseline Cr 1.7  . Depression   . Hx pulmonary embolism 08/1991    negative hypercoagulable panel 12/2014  . History of phlebitis   . Allergic rhinitis   . Urge incontinence   . Elevated PSA     previous-normalized (followed by Dr. Jeffie Pollock, rec no repeat unless urinary sxs)  . HLD (hyperlipidemia)     hypertriglyceridemia  . Asthma     remote  . Arthritis   . Blood transfusion 1990's  . Schatzki's ring 02/2012    s/p dilation [name]  . History of fracture of right hip   . Hearing loss 06/2012    eval - rec annual exam  . Essential tremor 09/27/2008    reviewed eval by Dr Jannifer Franklin in chart 2011   . Prediabetes   . DISH (diffuse idiopathic skeletal hyperostosis) 03/2013    lumbar spine on xray  . DDD (degenerative disc disease) 03/2013    by CT, diffuse multilevel cervical and lumbar spondylosis  . Osteoporosis 11/2013    DEXA T score -2.7  . Macrocytic anemia 2014    stable B12/folate and periph smear 05/2013, again periph smear 2017 with mature cells, mild dacrocytosis  . Sleep apnea     no CPAP- sleep apnea "cleared up 15 years ago"  . Dizziness     multifactorial, s/p PT at [facility] 06/2014 with HEP  . History of pyelonephritis 05/2014   hospitalization with sepsis  . Closed C7 fracture ([place]) 11/06/2014  . UTI (urinary tract infection) 05/13/2015  . UTI (urinary tract infection) due to Enterococcus 05/13/2015  . Candidal urethritis in male 06/15/2015  . Stroke [facility])    Past Surgical History  Procedure Laterality Date  . Tonsillectomy  1965  . Hernia repair  1979    Left  . Cystoscopy  1992    for kidney stones  . Exploratory laparotomy  1996    with incidental appendectomy  . Appendectomy  1996  . Knee arthroscopy  03/24/02    Right (Dr. Mauri Pole)  . Orif femoral neck fracture w/ dhs  08/03/02    Right (Dr. Mauri Pole)  . Ct abd w & pelvis wo cm  07/2001    Scarring of right lung, stable negative o/w  . Ct abd w & pelvis wo cm  11/2000    ? stones, right LL scarring  . V/q scan  06/1999    negative  . Colonoscopy  1998    [place]  . US echocardiography  12/28/2007    Mild aortic valve calcification EF 55%, basically nml  . Carotid u/s  12/28/2007    nml  . Eeg  11/12/2009  nml  . Mri  11/2009    Head, nml  . Cataract extraction  Jan, March 2011    bilateral  . Colonoscopy  07/2010    5 polyps, adenomatous, rec rpt 3 yrs  . Esophagogastroduodenoscopy  02/2012    dilation of schatzki's ring [name]  . Upper gastrointestinal endoscopy    . Colonoscopy  04/2014    3 polyps, adenomatous, f/u open ended given age [name]  . Cardiac catheterization  2000    30% one vessel  . Cardiovascular stress test  03/2015    no ischemia, low risk, EF 76%  . Esophagogastroduodenoscopy N/A 11/15/2015    Procedure: ESOPHAGOGASTRODUODENOSCOPY (EGD);  Surgeon: Gatha Mayer, MD;  Location: [facility] ENDOSCOPY;  Service: Endoscopy;  Laterality: N/A;   Social History   Social History  . Marital Status: Married    Spouse Name: N/A  . Number of Children: 2  . Years of Education: N/A   Occupational History  . 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 Smoking status: Never Smoker   . Smokeless  tobacco: Never Used  . Alcohol Use: No  . Drug Use: No  . Sexual Activity: Not Asked   Other Topics Concern  . None   Social History Narrative   Married with 2 children   Husband of Skipper Machin    Retired: was principal    Activity: walks dog 2-3 times daily about 56min, frequent stops    Diet: some water, good fruits/vegetables, fish 1x/wk, no sodas.      Advanced directives: Chauncey Reading is wife, [name]. Does not want prolonged life support if terminal      Patient does not drink caffeine.   Patient is right handed.    Family History  Problem Relation Age of Onset  . Stroke Mother   . Hypertension Mother   . Cancer Brother     prostate with mets  . Blindness Brother     legally  . Diabetes Brother   . Pulmonary embolism Sister     from shoulder operation  . Alcohol abuse Brother   . Colon cancer Neg Hx   . Esophageal cancer Neg Hx   . Rectal cancer Neg Hx   . Stomach cancer Neg Hx    Allergies  Allergen Reactions  . Lactose Intolerance (Gi)   . Nizatidine     REACTION: Rash (Axid)  . Sulfadiazine     REACTION: Hives  . Aricept [Donepezil Hcl] Rash  . Lipitor [Atorvastatin] Rash  . Penicillins Rash    Has patient had a PCN reaction causing immediate rash, facial/tongue/throat swelling, SOB or lightheadedness with hypotension: No Has patient had a PCN reaction causing severe rash involving mucus membranes or skin necrosis: No Has patient had a PCN reaction that required hospitalization No Has patient had a PCN reaction occurring within the last 10 years: No If all of the above answers are "NO",  then may proceed with Cephalosporin use.   Prior to Admission medications   Medication Sig Start Date End Date Taking? Authorizing Provider  albuterol (PROVENTIL HFA;VENTOLIN HFA) 108 (90 Base) MCG/ACT inhaler Inhale 2 puffs into the lungs every 6 (six) hours as needed for wheezing or shortness of breath. 01/10/16  Yes Ria Bush, MD  b complex vitamins tablet Take 1  tablet by mouth daily.   Yes Historical Provider, MD  Biotin 2500 MCG CAPS Take 1 capsule by mouth daily.   Yes Historical Provider, MD  Cholecalciferol (VITAMIN D) 2000 UNITS CAPS Take 1 capsule by mouth daily. Reported on 01/30/2016   Yes Historical Provider, MD  clotrimazole (LOTRIMIN) 1 % cream Apply 1 application topically 2 (two) times daily. Patient taking differently: Apply 1 application topically 2 (two) times daily as needed. For rash 05/02/15  Yes Pleas Koch, NP  Coenzyme Q10 (COQ-10) 100 MG CAPS Take 1 capsule by mouth daily.    Yes Historical Provider, MD  Cyanocobalamin (B-12 PO) Take 10,000 mcg by mouth every Monday, Wednesday, and Friday.    Yes Historical Provider, MD  dexlansoprazole (DEXILANT) 60 MG capsule Take 1 capsule (60 mg total) by mouth daily. 01/10/16  Yes Ria Bush, MD  docusate sodium (COLACE) 100 MG capsule Take 100 mg by mouth at bedtime.    Yes Historical Provider, MD  fenofibrate (TRICOR) 145 MG tablet Take 1 tablet (145 mg total) by mouth daily. 01/10/16  Yes Ria Bush, MD  fexofenadine (ALLEGRA) 180 MG tablet Take 180 mg by mouth daily.    Yes Historical Provider, MD  gabapentin (NEURONTIN) 100 MG capsule Take 2 capsules (200 mg total) by mouth 3 (three) times daily. Patient taking differently: Take 200-300 mg by mouth 2 (two) times daily. Take 3 capsules in the morning Take 2 capsules in the evening 09/21/15  Yes Kathrynn Ducking, MD  Multiple Vitamins-Minerals (MULTIVITAMIN PO) Take 1 tablet by mouth daily.    Yes Historical Provider, MD  simvastatin (ZOCOR) 40 MG tablet Take 1 tablet (40 mg total) by mouth daily. 01/10/16  Yes Ria Bush, MD  venlafaxine (EFFEXOR) 75 MG tablet Take 1 tablet (75 mg total) by mouth 2 (two) times daily with a meal. 01/10/16  Yes Ria Bush, MD  warfarin (COUMADIN) 2.5 MG tablet Take as directed by anti-coagulation clinic. Patient taking differently: Take 2.5-3.75 mg by mouth daily. Take 3.75 mg on Monday and  Friday then take 2.5 mg all the other days 12/06/15  Yes Ria Bush, MD       All other systems have been reviewed and were otherwise negative with the exception of those mentioned in the HPI and as above.  Physical Exam: Filed Vitals:   03/10/16 0345 03/10/16 0400  BP: 124/56 125/55  Pulse: 85 84  Temp:    Resp: 12 12    General: Alert, no acute distress Cardiovascular: No pedal edema Respiratory: No cyanosis, no use of accessory musculature GI: No organomegaly, abdomen is soft and non-tender Skin: No lesions in the area of chief complaint Neurologic: Sensation intact distally Psychiatric: Patient is competent for consent with normal mood and affect Lymphatic: No axillary or cervical lymphadenopathy  MUSCULOSKELETAL: left hip with painful rom but NVI distally  Assessment/Plan:  Plan for Left hip pinning when INR has normalized more to be performed by Dr. Lyla Glassing. Tentatively planned for tomorrow or wednseday.  The risks benefits and treatment options were reviewed including possible complications of bleeding, infection, neurovascular injury, ongoing pain, anesthetic complication, and possible need for additional surgery. The patient understands, accepts and agrees to proceed with the planned procedure.  Naw Lasala, PA-C 03/10/2016 8:18 AM Contact # [phone]

## 2016-03-11 NOTE — Transfer of Care (Signed)
Immediate Anesthesia Transfer of Care Note  Patient: [name]  Procedure(s) Performed: Procedure(s): CANNULATED HIP PINNING (Left)  Patient Location: PACU  Anesthesia Type:General  Level of Consciousness: sedated, patient cooperative and confused  Airway & Oxygen Therapy: Patient Spontanous Breathing and Patient connected to nasal cannula oxygen  Post-op Assessment: Report given to RN, Post -op Vital signs reviewed and stable and Patient moving all extremities  Post vital signs: Reviewed and stable  Last Vitals:  Filed Vitals:   03/10/16 2044 03/11/16 0537  BP: 130/56 116/55  Pulse: 79 69  Temp: 37.8 C 36.7 C  Resp: 18 17    Last Pain:  Filed Vitals:   03/11/16 0911  PainSc: 3       Patients Stated Pain Goal: 3 ([id number])  Complications: No apparent anesthesia complications

## 2016-03-11 NOTE — Progress Notes (Signed)
[facility] PROGRESS NOTE  [name] [id number] DOB: July 19, 1931 DOA: 03/09/2016   PCP: Ria Bush, MD     Assessment/Plan: Active Problems:   Femoral neck fracture, left, closed, initial encounter   Left displaced femoral neck fracture ([place])   Fracture  82 y.o. male with medical history significant of R hip fx, CKD stage 3, prior PE and TIA on coumadin. Patient presents to the ED after a mechanical fall at home. This occurred just PTA, he tripped over his walker. [place] [address] struck head on couch and carpeted floor. Reports pain to left thigh and knee. Unable to bear weight on hip. H/o prior R hip fx and C7 fx from falls. Pain better with fentanyl.  Assessment and plan Left hip fracture Left hip pinning   performed by Dr. Lyla Glassing.  Okay to resume Coumadin Monitor INR He may be 50% weightbearing left lower extremity with a walker PT OT evaluation  History of pulmonary embolism Start Lovenox for DVT prophylaxis, discontinue Lovenox when INR greater than 2  Coumadin resumed  Gastroesophageal reflux disease-continue PPI  Depression continue Effexor      DVT prophylaxsis  Lovenox, discontinue Lovenox once INR greater than 2.0  Code Status:  Full code      Family Communication: Discussed in detail with the patient's daughter , all imaging results, lab results explained to the patient   Disposition Plan: Anticipate discharge to SNF in one to 2 days      Consultants: Orthopedics   Procedures: Percutaneous screw fixation of left femoral neck  Antibiotics: Anti-infectives    Start     Dose/Rate Route Frequency Ordered Stop   03/11/16 1400  clindamycin (CLEOCIN) IVPB 600 mg     600 mg 100 mL/hr over 30 Minutes Intravenous Every 6 hours 03/11/16 1007 03/12/16 0159   03/11/16 0734  clindamycin (CLEOCIN) 900 MG/50ML IVPB    Comments:  Izora Gala   : cabinet override      03/11/16 0734 03/11/16 1944          HPI/Subjective: Patient resting comfortably after surgery this morning, no complaints  Objective: Filed Vitals:   03/11/16 0910 03/11/16 0925 03/11/16 0940 03/11/16 1004  BP: 150/73 111/76 127/56 122/59  Pulse: 80 79 87 78  Temp: 98 F (36.7 C)  98 F (36.7 C) 97.9 F (36.6 C)  TempSrc:    Oral  Resp: 14 13 21 16   Height:      Weight:      SpO2: 100% 96% 95% 97%    Intake/Output Summary (Last 24 hours) at 03/11/16 1027 Last data filed at 03/11/16 0940  Gross per 24 hour  Intake   1270 ml  Output    300 ml  Net    970 ml    Exam:  Examination:  General exam: Appears calm and comfortable  Respiratory system: Clear to auscultation. Respiratory effort normal. Cardiovascular system: S1 & S2 heard, RRR. No JVD, murmurs, rubs, gallops or clicks. No pedal edema. Gastrointestinal system: Abdomen is nondistended, soft and nontender. No organomegaly or masses felt. Normal bowel sounds heard. Central nervous system: Alert and oriented. No focal neurological deficits. Extremities: Symmetric 5 x 5 power. Skin: No rashes, lesions or ulcers Psychiatry: Judgement and insight appear normal. Mood & affect appropriate.     Data Reviewed: I have personally reviewed following labs and imaging studies  Micro Results Recent Results (from the past 240 hour(s))  MRSA PCR Screening     Status: None  Collection Time: 03/10/16  5:26 AM  Result Value Ref Range Status   MRSA by PCR NEGATIVE NEGATIVE Final    Comment:        The GeneXpert MRSA Assay (FDA approved for NASAL specimens only), is one component of a comprehensive MRSA colonization surveillance program. It is not intended to diagnose MRSA infection nor to guide or monitor treatment for MRSA infections.     Radiology Reports Ct Head Wo Contrast  03/10/2016  CLINICAL DATA:  Hit left parietal-occipital region of the head after fall. Neck pain. Initial encounter. EXAM: CT HEAD WITHOUT CONTRAST CT CERVICAL SPINE  WITHOUT CONTRAST TECHNIQUE: Multidetector CT imaging of the head and cervical spine was performed following the standard protocol without intravenous contrast. Multiplanar CT image reconstructions of the cervical spine were also generated. COMPARISON:  MRI of the brain and CT of the head performed 11/12/2015; CT of the cervical spine performed 11/06/2014 FINDINGS: CT HEAD FINDINGS There is no evidence of acute infarction, mass lesion, or intra- or extra-axial hemorrhage on CT. Prominence of the ventricles and sulci reflects moderate cortical volume loss. Mild cerebellar atrophy is noted. Scattered periventricular and subcortical white matter change likely reflects small vessel ischemic microangiopathy. Incidental note is made of a cavum septum pellucidum. The brainstem and fourth ventricle are within normal limits. The basal ganglia are unremarkable in appearance. The cerebral hemispheres demonstrate grossly normal gray-white differentiation. No mass effect or midline shift is seen. There is no evidence of fracture; visualized osseous structures are unremarkable in appearance. The visualized portions of the orbits are within normal limits. The paranasal sinuses and mastoid air cells are well-aerated. No significant soft tissue abnormalities are seen. CT CERVICAL SPINE FINDINGS There is no evidence of fracture or subluxation. Vertebral bodies demonstrate normal alignment. There is slight chronic loss of height along the lower cervical vertebral bodies, with prominent anterior osteophytes noted along the cervical spine. Intervertebral disc spaces are preserved. Prevertebral soft tissues are within normal limits. The thyroid gland is unremarkable in appearance. Mild atelectasis is noted at the lung apices. No significant soft tissue abnormalities are seen. IMPRESSION: 1. No evidence of traumatic intracranial injury or fracture. 2. No evidence of fracture or subluxation along the cervical spine. 3. Moderate cortical  volume loss and scattered small vessel ischemic microangiopathy. 4. Degenerative change along the cervical spine. 5. Mild atelectasis at the lung apices. Electronically Signed   By: Garald Balding M.D.   On: 03/10/2016 03:36   Ct Cervical Spine Wo Contrast  03/10/2016  CLINICAL DATA:  Hit left parietal-occipital region of the head after fall. Neck pain. Initial encounter. EXAM: CT HEAD WITHOUT CONTRAST CT CERVICAL SPINE WITHOUT CONTRAST TECHNIQUE: Multidetector CT imaging of the head and cervical spine was performed following the standard protocol without intravenous contrast. Multiplanar CT image reconstructions of the cervical spine were also generated. COMPARISON:  MRI of the brain and CT of the head performed 11/12/2015; CT of the cervical spine performed 11/06/2014 FINDINGS: CT HEAD FINDINGS There is no evidence of acute infarction, mass lesion, or intra- or extra-axial hemorrhage on CT. Prominence of the ventricles and sulci reflects moderate cortical volume loss. Mild cerebellar atrophy is noted. Scattered periventricular and subcortical white matter change likely reflects small vessel ischemic microangiopathy. Incidental note is made of a cavum septum pellucidum. The brainstem and fourth ventricle are within normal limits. The basal ganglia are unremarkable in appearance. The cerebral hemispheres demonstrate grossly normal gray-white differentiation. No mass effect or midline shift is seen. There is no  evidence of fracture; visualized osseous structures are unremarkable in appearance. The visualized portions of the orbits are within normal limits. The paranasal sinuses and mastoid air cells are well-aerated. No significant soft tissue abnormalities are seen. CT CERVICAL SPINE FINDINGS There is no evidence of fracture or subluxation. Vertebral bodies demonstrate normal alignment. There is slight chronic loss of height along the lower cervical vertebral bodies, with prominent anterior osteophytes noted along  the cervical spine. Intervertebral disc spaces are preserved. Prevertebral soft tissues are within normal limits. The thyroid gland is unremarkable in appearance. Mild atelectasis is noted at the lung apices. No significant soft tissue abnormalities are seen. IMPRESSION: 1. No evidence of traumatic intracranial injury or fracture. 2. No evidence of fracture or subluxation along the cervical spine. 3. Moderate cortical volume loss and scattered small vessel ischemic microangiopathy. 4. Degenerative change along the cervical spine. 5. Mild atelectasis at the lung apices. Electronically Signed   By: Garald Balding M.D.   On: 03/10/2016 03:36   Ct Abdomen Pelvis W Contrast  03/10/2016  CLINICAL DATA:  Fall striking left side of body on the floor. Right lower quadrant pain. On anticoagulation. EXAM: CT ABDOMEN AND PELVIS WITH CONTRAST TECHNIQUE: Multidetector CT imaging of the abdomen and pelvis was performed using the standard protocol following bolus administration of intravenous contrast. CONTRAST:  5mL ISOVUE-300 IOPAMIDOL (ISOVUE-300) INJECTION 61% COMPARISON:  CT 06/03/2014 FINDINGS: Lower chest: Coronary artery calcifications versus stents. Dependent lower lobe densities likely atelectasis. More rounded density in the right lower lobe is likely round atelectasis. Bilateral pleural thickening. Liver: No focal lesion or acute abnormality.  Prominent left lobe. Hepatobiliary: Calcified gallstones within physiologically distended gallbladder. No abnormal biliary dilatation. Pancreas: No ductal dilatation or inflammation. Spleen: Calcified granulomas. Subcentimeter hypodensity is nonspecific, likely small cyst or hemangioma, was present on prior exam. No evidence of traumatic injury or adjacent free fluid. Adrenal glands: No nodule or hemorrhage. Kidneys: Symmetric renal enhancement. No hydronephrosis. Exophytic cyst from the left kidney is unchanged. Additional tiny cortical hypodensity, too small to accurately  characterize. Stomach/Bowel: Stomach is distended with ingested contents. There are no dilated or thickened small bowel loops. Moderate volume of stool throughout the colon without colonic wall thickening. The appendix is not visualized, surgically absent per report. No traumatic injury or mesenteric hematoma. Vascular/Lymphatic: No retroperitoneal fluid or adenopathy. Abdominal aorta is normal in caliber. Mild atherosclerosis without aneurysm. Reproductive: Prominent prostate gland. Bladder: Physiologically distended.  No perivesicular inflammation. Other: No free air, free fluid, or intra-abdominal fluid collection. Fat within both inguinal canals. Musculoskeletal: Partially cannulated screws traverse the right femoral neck. Left hip to be evaluated on dedicated left hip CT, reported separately. Diffuse bony under mineralization. No compression deformity or acute lumbar spine fracture. Multilevel degenerative change. Old fractures of the medial and lateral left ribs scattered soft tissue edema without confluent hematoma of the subcutaneous tissues. IMPRESSION: 1. No acute traumatic abnormality of the abdomen or pelvis. 2. Chronic findings as described. 3. Atherosclerosis of the abdominal aorta. Coronary artery calcifications. Electronically Signed   By: Jeb Levering M.D.   On: 03/10/2016 03:36   Pelvis Portable  03/11/2016  CLINICAL DATA:  Postop left hip EXAM: PORTABLE PELVIS 1-2 VIEWS COMPARISON:  03/10/2016 FINDINGS: Single frontal view of the pelvis submitted. The patient is status post intraoperative repair of left femoral neck fracture. Again noted 3 metallic fixation screws along left femoral neck. There is anatomic alignment. Stable metallic fixation screws in right proximal femur. IMPRESSION: Status post intraoperative repair of left  femoral neck fracture. Three metallic fixation screws are noted in proximal left femur. There is anatomic alignment. Electronically Signed   By: Lahoma Crocker M.D.   On:  03/11/2016 09:50   Ct Hip Left Wo Contrast  03/10/2016  CLINICAL DATA:  Left hip pain after fall. EXAM: CT OF THE LEFT HIP WITHOUT CONTRAST TECHNIQUE: Multidetector CT imaging of the left hip was performed according to the standard protocol. Multiplanar CT image reconstructions were also generated. COMPARISON:  Left hip radiographs earlier this day. FINDINGS: Impaction fracture of the left femoral neck. No significant displacement. Left hip osteoarthritis with acetabular spurring and fragmentation. Pubic rami are intact. No large soft tissue hematoma. Soft tissue edema laterally. Excreted intravenous contrast in the urinary bladder. IMPRESSION: Impaction fracture of the left femoral neck. No significant displacement. Electronically Signed   By: Jeb Levering M.D.   On: 03/10/2016 04:09   Chest Portable 1 View  03/10/2016  CLINICAL DATA:  Preoperative chest radiograph.  Initial encounter. EXAM: PORTABLE CHEST 1 VIEW COMPARISON:  Chest radiograph performed 11/12/2015 FINDINGS: The lungs are well-aerated. Mild bibasilar atelectasis is noted. There is no evidence of pleural effusion or pneumothorax. The cardiomediastinal silhouette is enlarged. No acute osseous abnormalities are seen. IMPRESSION: Mild bibasilar atelectasis noted.  Cardiomegaly seen. Electronically Signed   By: Garald Balding M.D.   On: 03/10/2016 04:04   Dg Knee Complete 4 Views Left  03/10/2016  CLINICAL DATA:  Unwitnessed fall while ambulating with walker. Fall in living room. Left hip and knee pain after fall. EXAM: LEFT KNEE - COMPLETE 4+ VIEW COMPARISON:  None. FINDINGS: No fracture or dislocation. The alignment and joint spaces are maintained. 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 No joint effusion. Small quadriceps tendon enthesophyte. Vascular calcifications are seen. IMPRESSION: No fracture or subluxation of the left knee. Electronically Signed   By: Jeb Levering M.D.   On: 03/10/2016 00:54   Dg Hip Operative Unilat With  Pelvis Left  03/11/2016  CLINICAL DATA:  Operative imaging from the ORIF of left subcapital femoral neck fracture. EXAM: OPERATIVE LEFT HIP (WITH PELVIS IF PERFORMED) 4 VIEWS TECHNIQUE: Fluoroscopic spot image(s) were submitted for interpretation post-operatively. COMPARISON:  03/10/2016 FINDINGS: Images show placement of 3 screws crossing the femoral neck into femoral head supporting the subcapital femoral neck fracture. The orthopedic hardware is well seated. There is no new fracture or evidence of an operative complication. IMPRESSION: Portable imaging from the ORIF of a left femoral neck fracture. Electronically Signed   By: Lajean Manes M.D.   On: 03/11/2016 09:00   Dg Hip Unilat With Pelvis 2-3 Views Left  03/10/2016  CLINICAL DATA:  Unwitnessed fall while ambulating with walker. Fall in living room. Left hip and knee pain after fall. EXAM: DG HIP (WITH OR WITHOUT PELVIS) 2-3V LEFT COMPARISON:  None. FINDINGS: Linear sclerosis involving the subcapital left femoral neck, suspicious for impacted femoral neck fracture. Femoral head remains seated in the acetabulum. Fragmentation multiple lateral acetabulum is similar to prior abdominal radiograph from 2011 and appears chronic. No evidence pubic rami fracture. Multiple partially cannulated screws traverse the right proximal femur. IMPRESSION: Findings suspicious for impacted left femoral neck fracture. Electronically Signed   By: Jeb Levering M.D.   On: 03/10/2016 00:52     CBC  Recent Labs Lab 03/10/16 0022 03/10/16 0058 03/11/16 0258  WBC 8.9  --  6.8  HGB 10.8* 11.2* 9.9*  HCT 32.8* 33.0* 30.1*  PLT 118*  --  101*  MCV 112.3*  --  112.7*  MCH 37.0*  --  37.1*  MCHC 32.9  --  32.9  RDW 14.5  --  14.4  LYMPHSABS 1.4  --   --   MONOABS 0.5  --   --   EOSABS 0.2  --   --   BASOSABS 0.0  --   --     Chemistries   Recent Labs Lab 03/10/16 0022 03/10/16 0058 03/11/16 0258  NA 137 140 135  K 4.7 4.7 4.7  CL 104 102 107  CO2 28   --  23  GLUCOSE 124* 120* 121*  BUN 18 20 19   CREATININE 1.50* 1.50* 1.59*  CALCIUM 9.3  --  8.4*  AST 58*  --  41  ALT 48  --  38  ALKPHOS 38  --  31*  BILITOT 1.0  --  1.6*   ------------------------------------------------------------------------------------------------------------------ estimated creatinine clearance is 32.3 mL/min (by C-G formula based on Cr of 1.59). ------------------------------------------------------------------------------------------------------------------ No results for input(s): HGBA1C in the last 72 hours. ------------------------------------------------------------------------------------------------------------------ No results for input(s): CHOL, HDL, LDLCALC, TRIG, CHOLHDL, LDLDIRECT in the last 72 hours. ------------------------------------------------------------------------------------------------------------------ No results for input(s): TSH, T4TOTAL, T3FREE, THYROIDAB in the last 72 hours.  Invalid input(s): FREET3 ------------------------------------------------------------------------------------------------------------------ No results for input(s): VITAMINB12, FOLATE, FERRITIN, TIBC, IRON, RETICCTPCT in the last 72 hours.  Coagulation profile  Recent Labs Lab 03/06/16 1028 03/10/16 0022 03/10/16 1111 03/11/16 0258  INR 4.1 3.07* 1.85* 1.54*    No results for input(s): DDIMER in the last 72 hours.  Cardiac Enzymes No results for input(s): CKMB, TROPONINI, MYOGLOBIN in the last 168 hours.  Invalid input(s): CK ------------------------------------------------------------------------------------------------------------------ Invalid input(s): POCBNP   CBG: No results for input(s): GLUCAP in the last 168 hours.     Studies: Ct Head Wo Contrast  03/10/2016  CLINICAL DATA:  Hit left parietal-occipital region of the head after fall. Neck pain. Initial encounter. EXAM: CT HEAD WITHOUT CONTRAST CT CERVICAL SPINE WITHOUT CONTRAST  TECHNIQUE: Multidetector CT imaging of the head and cervical spine was performed following the standard protocol without intravenous contrast. Multiplanar CT image reconstructions of the cervical spine were also generated. COMPARISON:  MRI of the brain and CT of the head performed 11/12/2015; CT of the cervical spine performed 11/06/2014 FINDINGS: CT HEAD FINDINGS There is no evidence of acute infarction, mass lesion, or intra- or extra-axial hemorrhage on CT. Prominence of the ventricles and sulci reflects moderate cortical volume loss. Mild cerebellar atrophy is noted. Scattered periventricular and subcortical white matter change likely reflects small vessel ischemic microangiopathy. Incidental note is made of a cavum septum pellucidum. The brainstem and fourth ventricle are within normal limits. The basal ganglia are unremarkable in appearance. The cerebral hemispheres demonstrate grossly normal gray-white differentiation. No mass effect or midline shift is seen. There is no evidence of fracture; visualized osseous structures are unremarkable in appearance. The visualized portions of the orbits are within normal limits. The paranasal sinuses and mastoid air cells are well-aerated. No significant soft tissue abnormalities are seen. CT CERVICAL SPINE FINDINGS There is no evidence of fracture or subluxation. Vertebral bodies demonstrate normal alignment. There is slight chronic loss of height along the lower cervical vertebral bodies, with prominent anterior osteophytes noted along the cervical spine. Intervertebral disc spaces are preserved. Prevertebral soft tissues are within normal limits. The thyroid gland is unremarkable in appearance. Mild atelectasis is noted at the lung apices. No significant soft tissue abnormalities are seen. IMPRESSION: 1. No evidence of traumatic intracranial injury or fracture. 2. No evidence of fracture or subluxation  along the cervical spine. 3. Moderate cortical volume loss and  scattered small vessel ischemic microangiopathy. 4. Degenerative change along the cervical spine. 5. Mild atelectasis at the lung apices. Electronically Signed   By: Garald Balding M.D.   On: 03/10/2016 03:36   Ct Cervical Spine Wo Contrast  03/10/2016  CLINICAL DATA:  Hit left parietal-occipital region of the head after fall. Neck pain. Initial encounter. EXAM: CT HEAD WITHOUT CONTRAST CT CERVICAL SPINE WITHOUT CONTRAST TECHNIQUE: Multidetector CT imaging of the head and cervical spine was performed following the standard protocol without intravenous contrast. Multiplanar CT image reconstructions of the cervical spine were also generated. COMPARISON:  MRI of the brain and CT of the head performed 11/12/2015; CT of the cervical spine performed 11/06/2014 FINDINGS: CT HEAD FINDINGS There is no evidence of acute infarction, mass lesion, or intra- or extra-axial hemorrhage on CT. Prominence of the ventricles and sulci reflects moderate cortical volume loss. Mild cerebellar atrophy is noted. Scattered periventricular and subcortical white matter change likely reflects small vessel ischemic microangiopathy. Incidental note is made of a cavum septum pellucidum. The brainstem and fourth ventricle are within normal limits. The basal ganglia are unremarkable in appearance. The cerebral hemispheres demonstrate grossly normal gray-white differentiation. No mass effect or midline shift is seen. There is no evidence of fracture; visualized osseous structures are unremarkable in appearance. The visualized portions of the orbits are within normal limits. The paranasal sinuses and mastoid air cells are well-aerated. No significant soft tissue abnormalities are seen. CT CERVICAL SPINE FINDINGS There is no evidence of fracture or subluxation. Vertebral bodies demonstrate normal alignment. There is slight chronic loss of height along the lower cervical vertebral bodies, with prominent anterior osteophytes noted along the cervical  spine. Intervertebral disc spaces are preserved. Prevertebral soft tissues are within normal limits. The thyroid gland is unremarkable in appearance. Mild atelectasis is noted at the lung apices. No significant soft tissue abnormalities are seen. IMPRESSION: 1. No evidence of traumatic intracranial injury or fracture. 2. No evidence of fracture or subluxation along the cervical spine. 3. Moderate cortical volume loss and scattered small vessel ischemic microangiopathy. 4. Degenerative change along the cervical spine. 5. Mild atelectasis at the lung apices. Electronically Signed   By: Garald Balding M.D.   On: 03/10/2016 03:36   Ct Abdomen Pelvis W Contrast  03/10/2016  CLINICAL DATA:  Fall striking left side of body on the floor. Right lower quadrant pain. On anticoagulation. EXAM: CT ABDOMEN AND PELVIS WITH CONTRAST TECHNIQUE: Multidetector CT imaging of the abdomen and pelvis was performed using the standard protocol following bolus administration of intravenous contrast. CONTRAST:  70mL ISOVUE-300 IOPAMIDOL (ISOVUE-300) INJECTION 61% COMPARISON:  CT 06/03/2014 FINDINGS: Lower chest: Coronary artery calcifications versus stents. Dependent lower lobe densities likely atelectasis. More rounded density in the right lower lobe is likely round atelectasis. Bilateral pleural thickening. Liver: No focal lesion or acute abnormality.  Prominent left lobe. Hepatobiliary: Calcified gallstones within physiologically distended gallbladder. No abnormal biliary dilatation. Pancreas: No ductal dilatation or inflammation. Spleen: Calcified granulomas. Subcentimeter hypodensity is nonspecific, likely small cyst or hemangioma, was present on prior exam. No evidence of traumatic injury or adjacent free fluid. Adrenal glands: No nodule or hemorrhage. Kidneys: Symmetric renal enhancement. No hydronephrosis. Exophytic cyst from the left kidney is unchanged. Additional tiny cortical hypodensity, too small to accurately characterize.  Stomach/Bowel: Stomach is distended with ingested contents. There are no dilated or thickened small bowel loops. Moderate volume of stool throughout the colon without colonic wall  thickening. The appendix is not visualized, surgically absent per report. No traumatic injury or mesenteric hematoma. Vascular/Lymphatic: No retroperitoneal fluid or adenopathy. Abdominal aorta is normal in caliber. Mild atherosclerosis without aneurysm. Reproductive: Prominent prostate gland. Bladder: Physiologically distended.  No perivesicular inflammation. Other: No free air, free fluid, or intra-abdominal fluid collection. Fat within both inguinal canals. Musculoskeletal: Partially cannulated screws traverse the right femoral neck. Left hip to be evaluated on dedicated left hip CT, reported separately. Diffuse bony under mineralization. No compression deformity or acute lumbar spine fracture. Multilevel degenerative change. Old fractures of the medial and lateral left ribs scattered soft tissue edema without confluent hematoma of the subcutaneous tissues. IMPRESSION: 1. No acute traumatic abnormality of the abdomen or pelvis. 2. Chronic findings as described. 3. Atherosclerosis of the abdominal aorta. Coronary artery calcifications. Electronically Signed   By: Jeb Levering M.D.   On: 03/10/2016 03:36   Pelvis Portable  03/11/2016  CLINICAL DATA:  Postop left hip EXAM: PORTABLE PELVIS 1-2 VIEWS COMPARISON:  03/10/2016 FINDINGS: Single frontal view of the pelvis submitted. The patient is status post intraoperative repair of left femoral neck fracture. Again noted 3 metallic fixation screws along left femoral neck. There is anatomic alignment. Stable metallic fixation screws in right proximal femur. IMPRESSION: Status post intraoperative repair of left femoral neck fracture. Three metallic fixation screws are noted in proximal left femur. There is anatomic alignment. Electronically Signed   By: Lahoma Crocker M.D.   On: 03/11/2016  09:50   Ct Hip Left Wo Contrast  03/10/2016  CLINICAL DATA:  Left hip pain after fall. EXAM: CT OF THE LEFT HIP WITHOUT CONTRAST TECHNIQUE: Multidetector CT imaging of the left hip was performed according to the standard protocol. Multiplanar CT image reconstructions were also generated. COMPARISON:  Left hip radiographs earlier this day. FINDINGS: Impaction fracture of the left femoral neck. No significant displacement. Left hip osteoarthritis with acetabular spurring and fragmentation. Pubic rami are intact. No large soft tissue hematoma. Soft tissue edema laterally. Excreted intravenous contrast in the urinary bladder. IMPRESSION: Impaction fracture of the left femoral neck. No significant displacement. Electronically Signed   By: Jeb Levering M.D.   On: 03/10/2016 04:09   Chest Portable 1 View  03/10/2016  CLINICAL DATA:  Preoperative chest radiograph.  Initial encounter. EXAM: PORTABLE CHEST 1 VIEW COMPARISON:  Chest radiograph performed 11/12/2015 FINDINGS: The lungs are well-aerated. Mild bibasilar atelectasis is noted. There is no evidence of pleural effusion or pneumothorax. The cardiomediastinal silhouette is enlarged. No acute osseous abnormalities are seen. IMPRESSION: Mild bibasilar atelectasis noted.  Cardiomegaly seen. Electronically Signed   By: Garald Balding M.D.   On: 03/10/2016 04:04   Dg Knee Complete 4 Views Left  03/10/2016  CLINICAL DATA:  Unwitnessed fall while ambulating with walker. Fall in living room. Left hip and knee pain after fall. EXAM: LEFT KNEE - COMPLETE 4+ VIEW COMPARISON:  None. FINDINGS: No fracture or dislocation. The alignment and joint spaces are maintained. 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 No joint effusion. Small quadriceps tendon enthesophyte. Vascular calcifications are seen. IMPRESSION: No fracture or subluxation of the left knee. Electronically Signed   By: Jeb Levering M.D.   On: 03/10/2016 00:54   Dg Hip Operative Unilat With Pelvis  Left  03/11/2016  CLINICAL DATA:  Operative imaging from the ORIF of left subcapital femoral neck fracture. EXAM: OPERATIVE LEFT HIP (WITH PELVIS IF PERFORMED) 4 VIEWS TECHNIQUE: Fluoroscopic spot image(s) were submitted for interpretation post-operatively. COMPARISON:  03/10/2016 FINDINGS: Images show placement of  3 screws crossing the femoral neck into femoral head supporting the subcapital femoral neck fracture. The orthopedic hardware is well seated. There is no new fracture or evidence of an operative complication. IMPRESSION: Portable imaging from the ORIF of a left femoral neck fracture. Electronically Signed   By: Lajean Manes M.D.   On: 03/11/2016 09:00   Dg Hip Unilat With Pelvis 2-3 Views Left  03/10/2016  CLINICAL DATA:  Unwitnessed fall while ambulating with walker. Fall in living room. Left hip and knee pain after fall. EXAM: DG HIP (WITH OR WITHOUT PELVIS) 2-3V LEFT COMPARISON:  None. FINDINGS: Linear sclerosis involving the subcapital left femoral neck, suspicious for impacted femoral neck fracture. Femoral head remains seated in the acetabulum. Fragmentation multiple lateral acetabulum is similar to prior abdominal radiograph from 2011 and appears chronic. No evidence pubic rami fracture. Multiple partially cannulated screws traverse the right proximal femur. IMPRESSION: Findings suspicious for impacted left femoral neck fracture. Electronically Signed   By: Jeb Levering M.D.   On: 03/10/2016 00:52      Lab Results  Component Value Date   HGBA1C 5.7* 11/13/2015   HGBA1C 5.5 06/02/2013   HGBA1C 5.8 09/06/2012   Lab Results  Component Value Date   MICROALBUR 16.2* 11/22/2015   LDLCALC 74 11/13/2015   CREATININE 1.59* 03/11/2016       Scheduled Meds: . clindamycin      . clindamycin (CLEOCIN) IV  600 mg Intravenous Q6H  . docusate sodium  100 mg Oral QHS  . enoxaparin (LOVENOX) injection  40 mg Subcutaneous Q24H  . fenofibrate  160 mg Oral Daily  . gabapentin  200 mg  Oral QHS  . gabapentin  300 mg Oral Daily  . loratadine  10 mg Oral Daily  . pantoprazole  40 mg Oral Daily  . simvastatin  40 mg Oral Daily  . venlafaxine  75 mg Oral BID WC  . warfarin  2.5 mg Oral ONCE-1800  . Warfarin - Pharmacist Dosing Inpatient   Does not apply q1800   Continuous Infusions: . sodium chloride       LOS: 1 day    Time spent: >30 MINS    [facility]  [facility] Pager [phone]. If 7PM-7AM, please contact night-coverage at [email or web address], password [facility] 03/11/2016, 10:27 AM  LOS: 1 day

## 2016-03-11 NOTE — Op Note (Signed)
NAMEMARQUEZ, COYLE NO.:  [id number]  MEDICAL RECORD NO.:  [id number]  LOCATION:  [facility]                         FACILITY:  [facility]  PHYSICIAN:  Rod Can, MD     DATE OF BIRTH:  1931/07/30  DATE OF PROCEDURE:  03/11/2016 DATE OF DISCHARGE:                              OPERATIVE REPORT   SURGEON:  Rod Can, MD.  ASSISTANTS:  None.  PREOPERATIVE DIAGNOSIS:  Valgus impacted left femoral neck fracture.  POSTOPERATIVE DIAGNOSIS:  Valgus impacted left femoral neck fracture.  PROCEDURE PERFORMED:  Percutaneous screw fixation of left femoral neck.  IMPLANTS:  Biomet 8.0 mm cannulated titanium screws x3.  ESTIMATED BLOOD LOSS:  50 mL.  COMPLICATIONS:  None.  TUBES AND DRAINS:  None.  ANTIBIOTICS:  900 mg clindamycin due to penicillin allergy.  DISPOSITION:  Stable to PACU.  INDICATIONS:  The patient is an 82 year old male, who had a ground level fall at home on to his left hip.  He had left hip pain and inability to weight bear.  X-rays and CT scan revealed a mildly valgus impacted left femoral neck fracture.  He was admitted to the hospitalist service, underwent perioperative risk stratification, medical optimization.  We held his Coumadin.  He was given vitamin K and his INR came down appropriately.  He was then indicated for surgery.  Risks, benefits, and alternatives were explained.  They elected to proceed.  DESCRIPTION OF PROCEDURE IN DETAIL:  I identified the patient in the holding area using 2 identifiers.  Surgical site was marked by myself. He was taken to the operating room.  General anesthesia was induced.  A Foley was inserted.  He was then placed supine on the Hana table.  Right lower extremity was scissored underneath the left.  I reduced the fracture with standard traction, internal rotation, adduction.  Left hip was prepped and draped in normal sterile surgical fashion.  Time-out was called verifying site and side of surgery.  I  used fluoroscopy to define his proximal femoral anatomy.  I inserted guide pin for a cannulated screw in the inferior position on the AP x-ray and centrally on the lateral x-ray.  I then used the targeting Jena gun to place 2 additional superior guide pins, one anterior and one posterior.  All pins were inserted under fluoroscopic control to ensure no chondral penetration.  I then sequentially measured, drilled the near cortex and placed the 3 screws in an inverted triangle formation.  Final AP and lateral fluoroscopy views were obtained to confirm fracture reduction and hardware placement.  There was no chondral penetration.  Hardware position was appropriate.  I then copiously irrigated the wound with saline.  I closed the wound in layers with 2-0 Monocryl for the deep dermal layer, 3-0 running Monocryl subcuticular stitch.  I then applied glue to the skin.  Once the glue was hardened, Aquacel was applied.  The patient was then transferred to the bed, extubated, taken to PACU in stable condition.  Sponge, needle, and instrument counts were correct at the end of the case x2.  There were no known complications.  We will readmit the patient to the hospitalist.  He may be 50% weightbearing  left lower extremity with a walker.  We will resume his Coumadin starting tonight.  He will work with physical therapy.  He will likely need disposition planning.  I will see him in the office 2 weeks after discharge.          ______________________________ Rod Can, MD     BS/MEDQ  D:  03/11/2016  T:  03/11/2016  Job:  [id number]

## 2016-03-11 NOTE — Anesthesia Postprocedure Evaluation (Signed)
Anesthesia Post Note  Patient: [name]  Procedure(s) Performed: Procedure(s) (LRB): CANNULATED HIP PINNING (Left)  Patient location during evaluation: PACU Anesthesia Type: General Level of consciousness: awake and alert Pain management: pain level controlled Vital Signs Assessment: post-procedure vital signs reviewed and stable Respiratory status: spontaneous breathing, nonlabored ventilation, respiratory function stable and patient connected to nasal cannula oxygen Cardiovascular status: blood pressure returned to baseline and stable Postop Assessment: no signs of nausea or vomiting Anesthetic complications: no    Last Vitals:  Filed Vitals:   03/11/16 0537 03/11/16 0910  BP: 116/55   Pulse: 69   Temp: 36.7 C 36.7 C  Resp: 17     Last Pain:  Filed Vitals:   03/11/16 0917  PainSc: 3                  Bostyn Bogie S

## 2016-03-12 ENCOUNTER — Telehealth: Payer: Self-pay | Admitting: Hematology

## 2016-03-12 ENCOUNTER — Inpatient Hospital Stay (HOSPITAL_COMMUNITY): Payer: Medicare Other

## 2016-03-12 ENCOUNTER — Encounter (HOSPITAL_COMMUNITY): Payer: Self-pay | Admitting: Orthopedic Surgery

## 2016-03-12 LAB — CBC
HCT: 30.5 % — ABNORMAL LOW (ref 39.0–52.0)
HEMOGLOBIN: 10 g/dL — AB (ref 13.0–17.0)
MCH: 36.9 pg — AB (ref 26.0–34.0)
MCHC: 32.8 g/dL (ref 30.0–36.0)
MCV: 112.5 fL — ABNORMAL HIGH (ref 78.0–100.0)
Platelets: 110 10*3/uL — ABNORMAL LOW (ref 150–400)
RBC: 2.71 MIL/uL — ABNORMAL LOW (ref 4.22–5.81)
RDW: 14.5 % (ref 11.5–15.5)
WBC: 7.5 10*3/uL (ref 4.0–10.5)

## 2016-03-12 LAB — PREPARE FRESH FROZEN PLASMA
UNIT DIVISION: 0
UNIT DIVISION: 0
Unit division: 0
Unit division: 0

## 2016-03-12 LAB — URINALYSIS, ROUTINE W REFLEX MICROSCOPIC
Bilirubin Urine: NEGATIVE
GLUCOSE, UA: NEGATIVE mg/dL
KETONES UR: NEGATIVE mg/dL
LEUKOCYTES UA: NEGATIVE
Nitrite: NEGATIVE
PROTEIN: NEGATIVE mg/dL
Specific Gravity, Urine: 1.008 (ref 1.005–1.030)
pH: 6.5 (ref 5.0–8.0)

## 2016-03-12 LAB — URINE MICROSCOPIC-ADD ON

## 2016-03-12 LAB — COMPREHENSIVE METABOLIC PANEL
ALK PHOS: 36 U/L — AB (ref 38–126)
ALT: 31 U/L (ref 17–63)
ANION GAP: 7 (ref 5–15)
AST: 36 U/L (ref 15–41)
Albumin: 2.8 g/dL — ABNORMAL LOW (ref 3.5–5.0)
BILIRUBIN TOTAL: 1.3 mg/dL — AB (ref 0.3–1.2)
BUN: 20 mg/dL (ref 6–20)
CALCIUM: 8.7 mg/dL — AB (ref 8.9–10.3)
CO2: 27 mmol/L (ref 22–32)
Chloride: 103 mmol/L (ref 101–111)
Creatinine, Ser: 1.72 mg/dL — ABNORMAL HIGH (ref 0.61–1.24)
GFR calc non Af Amer: 35 mL/min — ABNORMAL LOW (ref >60)
GFR, EST AFRICAN AMERICAN: 41 mL/min — AB (ref >60)
Glucose, Bld: 115 mg/dL — ABNORMAL HIGH (ref 65–99)
Potassium: 4.7 mmol/L (ref 3.5–5.1)
SODIUM: 137 mmol/L (ref 135–145)
TOTAL PROTEIN: 5.2 g/dL — AB (ref 6.5–8.1)

## 2016-03-12 LAB — PROTIME-INR
INR: 1.27 (ref 0.00–1.49)
PROTHROMBIN TIME: 16.1 s — AB (ref 11.6–15.2)

## 2016-03-12 MED ORDER — CHLORHEXIDINE GLUCONATE 4 % EX LIQD
60.0000 mL | Freq: Once | CUTANEOUS | Status: DC
  Filled 2016-03-12: qty 60

## 2016-03-12 MED ORDER — CEFAZOLIN SODIUM-DEXTROSE 2-4 GM/100ML-% IV SOLN
2.0000 g | INTRAVENOUS | Status: AC
  Filled 2016-03-12: qty 100

## 2016-03-12 MED ORDER — WARFARIN SODIUM 5 MG PO TABS
5.0000 mg | ORAL_TABLET | Freq: Once | ORAL | Status: AC
  Administered 2016-03-12: 5 mg via ORAL
  Filled 2016-03-12: qty 1

## 2016-03-12 MED ORDER — POVIDONE-IODINE 10 % EX SWAB: 2.0000 "application " | Freq: Once | CUTANEOUS | Status: DC

## 2016-03-12 MED ORDER — CLINDAMYCIN PHOSPHATE 900 MG/50ML IV SOLN
900.0000 mg | INTRAVENOUS | Status: AC
  Filled 2016-03-12: qty 50

## 2016-03-12 MED ORDER — CHLORHEXIDINE GLUCONATE 4 % EX LIQD
60.0000 mL | Freq: Once | CUTANEOUS | Status: AC
  Administered 2016-03-12: 4 via TOPICAL
  Filled 2016-03-12: qty 60

## 2016-03-12 MED ORDER — LACTATED RINGERS IV SOLN: INTRAVENOUS | Status: DC

## 2016-03-12 NOTE — Progress Notes (Signed)
.     Subjective:  Patient reports pain as mild to moderate.  No c/o.  Objective:   VITALS:   Filed Vitals:   03/11/16 1004 03/11/16 1400 03/11/16 2006 03/12/16 0631  BP: 122/59 123/53 111/47 130/53  Pulse: 78 79 78 88  Temp: 97.9 F (36.6 C) 100.2 F (37.9 C) 98.6 F (37 C) 99.7 F (37.6 C)  TempSrc: Oral Oral Oral Oral  Resp: 16  15   Height:      Weight:      SpO2: 97% 98% 98% 97%    ABD soft Sensation intact distally Intact pulses distally Dorsiflexion/Plantar flexion intact Incision: dressing C/D/I Compartment soft   Lab Results  Component Value Date   WBC 7.5 03/12/2016   HGB 10.0* 03/12/2016   HCT 30.5* 03/12/2016   MCV 112.5* 03/12/2016   PLT 110* 03/12/2016   BMET    Component Value Date/Time   NA 137 03/12/2016 0301   NA 142 12/14/2015 1050   NA 137 11/17/2014   K 4.7 03/12/2016 0301   K 4.7 12/14/2015 1050   CL 103 03/12/2016 0301   CO2 27 03/12/2016 0301   CO2 28 12/14/2015 1050   GLUCOSE 115* 03/12/2016 0301   GLUCOSE 107 12/14/2015 1050   BUN 20 03/12/2016 0301   BUN 21.2 12/14/2015 1050   BUN 46* 11/17/2014   CREATININE 1.72* 03/12/2016 0301   CREATININE 1.7* 12/14/2015 1050   CREATININE 1.12 12/15/2014 1503   CREATININE 1.7* 11/17/2014   CALCIUM 8.7* 03/12/2016 0301   CALCIUM 9.9 12/14/2015 1050   GFRNONAA 35* 03/12/2016 0301   GFRAA 41* 03/12/2016 0301     Assessment/Plan: 1 Day Post-Op   Active Problems:   Femoral neck fracture, left, closed, initial encounter   Left displaced femoral neck fracture (HCC)   Fracture   50% WB LLE with walker Coumadin with lovenox bridge PT/OT Low grade fever: likely due to atelectasis, continue IS Dispo: d/c planning   Pinky Ravan, Horald Pollen 03/12/2016, 12:35 PM   Rod Can, MD Cell [phone]

## 2016-03-12 NOTE — Progress Notes (Addendum)
[facility] PROGRESS NOTE  [name] [id number] DOB: 1931/03/19 DOA: 03/09/2016   PCP: Ria Bush, MD     Assessment/Plan: Active Problems:   Femoral neck fracture, left, closed, initial encounter   Left displaced femoral neck fracture ([place])   Fracture  82 y.o. male with medical history significant of R hip fx, CKD stage 3, prior PE and TIA on coumadin. Patient presents to the ED after a mechanical fall at home. This occurred just PTA, he tripped over his walker. [place] struck head on couch and carpeted floor. Reports pain to left thigh and knee. Unable to bear weight on hip. H/o prior R hip fx and C7 fx from falls. Pain better with fentanyl.  Assessment and plan Left hip fracture Left hip pinning   performed by Dr. Lyla Glassing.  Okay to resume Coumadin INR still subtherapeutic, continue Lovenox Hemoglobin stable weightbearing left lower extremity with a walker, as per orthopedic recommendations PT OT evaluation pending Suspect may need SNF  History of pulmonary embolism Continue Lovenox for DVT prophylaxis, discontinue Lovenox when INR greater than 2  Coumadin resumed  Gastroesophageal reflux disease-continue PPI  Depression continue Effexor  Low-grade fever-likely secondary to atelectasis, check UA, chest x-ray    DVT prophylaxsis  Lovenox, discontinue Lovenox once INR greater than 2.0  Code Status:  Full code      Family Communication: Discussed in detail with the patient's daughter , all imaging results, lab results explained to the patient   Disposition Plan: Anticipate discharge to SNF in one to 2 days      Consultants: Orthopedics   Procedures: Percutaneous screw fixation of left femoral neck  Antibiotics: Anti-infectives    Start     Dose/Rate Route Frequency Ordered Stop   03/11/16 1400  clindamycin (CLEOCIN) IVPB 600 mg     600 mg 100 mL/hr over 30 Minutes Intravenous Every 6 hours 03/11/16 1007 03/11/16 2045    03/11/16 0734  clindamycin (CLEOCIN) 900 MG/50ML IVPB    Comments:  Izora Gala   : cabinet override      03/11/16 0734 03/11/16 1944         HPI/Subjective: Patient extremely uncomfortable sitting in the chest, low-grade fever,  Objective: Filed Vitals:   03/11/16 1004 03/11/16 1400 03/11/16 2006 03/12/16 0631  BP: 122/59 123/53 111/47 130/53  Pulse: 78 79 78 88  Temp: 97.9 F (36.6 C) 100.2 F (37.9 C) 98.6 F (37 C) 99.7 F (37.6 C)  TempSrc: Oral Oral Oral Oral  Resp: 16  15   Height:      Weight:      SpO2: 97% 98% 98% 97%    Intake/Output Summary (Last 24 hours) at 03/12/16 [id number] Last data filed at 03/12/16 0641  Gross per 24 hour  Intake    150 ml  Output   2850 ml  Net  -2700 ml    Exam:  Examination:  General exam: Appears calm and comfortable  Respiratory system: Clear to auscultation. Respiratory effort normal. Cardiovascular system: S1 & S2 heard, RRR. No JVD, murmurs, rubs, gallops or clicks. No pedal edema. Gastrointestinal system: Abdomen is nondistended, soft and nontender. No organomegaly or masses felt. Normal bowel sounds heard. Central nervous system: Alert and oriented. No focal neurological deficits. Extremities: Symmetric 5 x 5 power. Skin: No rashes, lesions or ulcers Psychiatry: Judgement and insight appear normal. Mood & affect appropriate.     Data Reviewed: I have personally reviewed following labs and imaging studies  Micro Results Recent Results (from the past 240 hour(s))  MRSA PCR Screening     Status: None   Collection Time: 03/10/16  5:26 AM  Result Value Ref Range Status   MRSA by PCR NEGATIVE NEGATIVE Final    Comment:        The GeneXpert MRSA Assay (FDA approved for NASAL specimens only), is one component of a comprehensive MRSA colonization surveillance program. It is not intended to diagnose MRSA infection nor to guide or monitor treatment for MRSA infections.     Radiology Reports Ct Head Wo  Contrast  03/10/2016  CLINICAL DATA:  Hit left parietal-occipital region of the head after fall. Neck pain. Initial encounter. EXAM: CT HEAD WITHOUT CONTRAST CT CERVICAL SPINE WITHOUT CONTRAST TECHNIQUE: Multidetector CT imaging of the head and cervical spine was performed following the standard protocol without intravenous contrast. Multiplanar CT image reconstructions of the cervical spine were also generated. COMPARISON:  MRI of the brain and CT of the head performed 11/12/2015; CT of the cervical spine performed 11/06/2014 FINDINGS: CT HEAD FINDINGS There is no evidence of acute infarction, mass lesion, or intra- or extra-axial hemorrhage on CT. Prominence of the ventricles and sulci reflects moderate cortical volume loss. Mild cerebellar atrophy is noted. Scattered periventricular and subcortical white matter change likely reflects small vessel ischemic microangiopathy. Incidental note is made of a cavum septum pellucidum. The brainstem and fourth ventricle are within normal limits. The basal ganglia are unremarkable in appearance. The cerebral hemispheres demonstrate grossly normal gray-white differentiation. No mass effect or midline shift is seen. There is no evidence of fracture; visualized osseous structures are unremarkable in appearance. The visualized portions of the orbits are within normal limits. The paranasal sinuses and mastoid air cells are well-aerated. No significant soft tissue abnormalities are seen. CT CERVICAL SPINE FINDINGS There is no evidence of fracture or subluxation. Vertebral bodies demonstrate normal alignment. There is slight chronic loss of height along the lower cervical vertebral bodies, with prominent anterior osteophytes noted along the cervical spine. Intervertebral disc spaces are preserved. Prevertebral soft tissues are within normal limits. The thyroid gland is unremarkable in appearance. Mild atelectasis is noted at the lung apices. No significant soft tissue abnormalities  are seen. IMPRESSION: 1. No evidence of traumatic intracranial injury or fracture. 2. No evidence of fracture or subluxation along the cervical spine. 3. Moderate cortical volume loss and scattered small vessel ischemic microangiopathy. 4. Degenerative change along the cervical spine. 5. Mild atelectasis at the lung apices. Electronically Signed   By: Garald Balding M.D.   On: 03/10/2016 03:36   Ct Cervical Spine Wo Contrast  03/10/2016  CLINICAL DATA:  Hit left parietal-occipital region of the head after fall. Neck pain. Initial encounter. EXAM: CT HEAD WITHOUT CONTRAST CT CERVICAL SPINE WITHOUT CONTRAST TECHNIQUE: Multidetector CT imaging of the head and cervical spine was performed following the standard protocol without intravenous contrast. Multiplanar CT image reconstructions of the cervical spine were also generated. COMPARISON:  MRI of the brain and CT of the head performed 11/12/2015; CT of the cervical spine performed 11/06/2014 FINDINGS: CT HEAD FINDINGS There is no evidence of acute infarction, mass lesion, or intra- or extra-axial hemorrhage on CT. Prominence of the ventricles and sulci reflects moderate cortical volume loss. Mild cerebellar atrophy is noted. Scattered periventricular and subcortical white matter change likely reflects small vessel ischemic microangiopathy. Incidental note is made of a cavum septum pellucidum. The brainstem and fourth ventricle are within normal limits. The basal ganglia are unremarkable  in appearance. The cerebral hemispheres demonstrate grossly normal gray-white differentiation. No mass effect or midline shift is seen. There is no evidence of fracture; visualized osseous structures are unremarkable in appearance. The visualized portions of the orbits are within normal limits. The paranasal sinuses and mastoid air cells are well-aerated. No significant soft tissue abnormalities are seen. CT CERVICAL SPINE FINDINGS There is no evidence of fracture or subluxation.  Vertebral bodies demonstrate normal alignment. There is slight chronic loss of height along the lower cervical vertebral bodies, with prominent anterior osteophytes noted along the cervical spine. Intervertebral disc spaces are preserved. Prevertebral soft tissues are within normal limits. The thyroid gland is unremarkable in appearance. Mild atelectasis is noted at the lung apices. No significant soft tissue abnormalities are seen. IMPRESSION: 1. No evidence of traumatic intracranial injury or fracture. 2. No evidence of fracture or subluxation along the cervical spine. 3. Moderate cortical volume loss and scattered small vessel ischemic microangiopathy. 4. Degenerative change along the cervical spine. 5. Mild atelectasis at the lung apices. Electronically Signed   By: Garald Balding M.D.   On: 03/10/2016 03:36   Ct Abdomen Pelvis W Contrast  03/10/2016  CLINICAL DATA:  Fall striking left side of body on the floor. Right lower quadrant pain. On anticoagulation. EXAM: CT ABDOMEN AND PELVIS WITH CONTRAST TECHNIQUE: Multidetector CT imaging of the abdomen and pelvis was performed using the standard protocol following bolus administration of intravenous contrast. CONTRAST:  66mL ISOVUE-300 IOPAMIDOL (ISOVUE-300) INJECTION 61% COMPARISON:  CT 06/03/2014 FINDINGS: Lower chest: Coronary artery calcifications versus stents. Dependent lower lobe densities likely atelectasis. More rounded density in the right lower lobe is likely round atelectasis. Bilateral pleural thickening. Liver: No focal lesion or acute abnormality.  Prominent left lobe. Hepatobiliary: Calcified gallstones within physiologically distended gallbladder. No abnormal biliary dilatation. Pancreas: No ductal dilatation or inflammation. Spleen: Calcified granulomas. Subcentimeter hypodensity is nonspecific, likely small cyst or hemangioma, was present on prior exam. No evidence of traumatic injury or adjacent free fluid. Adrenal glands: No nodule or  hemorrhage. Kidneys: Symmetric renal enhancement. No hydronephrosis. Exophytic cyst from the left kidney is unchanged. Additional tiny cortical hypodensity, too small to accurately characterize. Stomach/Bowel: Stomach is distended with ingested contents. There are no dilated or thickened small bowel loops. Moderate volume of stool throughout the colon without colonic wall thickening. The appendix is not visualized, surgically absent per report. No traumatic injury or mesenteric hematoma. Vascular/Lymphatic: No retroperitoneal fluid or adenopathy. Abdominal aorta is normal in caliber. Mild atherosclerosis without aneurysm. Reproductive: Prominent prostate gland. Bladder: Physiologically distended.  No perivesicular inflammation. Other: No free air, free fluid, or intra-abdominal fluid collection. Fat within both inguinal canals. Musculoskeletal: Partially cannulated screws traverse the right femoral neck. Left hip to be evaluated on dedicated left hip CT, reported separately. Diffuse bony under mineralization. No compression deformity or acute lumbar spine fracture. Multilevel degenerative change. Old fractures of the medial and lateral left ribs scattered soft tissue edema without confluent hematoma of the subcutaneous tissues. IMPRESSION: 1. No acute traumatic abnormality of the abdomen or pelvis. 2. Chronic findings as described. 3. Atherosclerosis of the abdominal aorta. Coronary artery calcifications. Electronically Signed   By: Jeb Levering M.D.   On: 03/10/2016 03:36   Pelvis Portable  03/11/2016  CLINICAL DATA:  Postop left hip EXAM: PORTABLE PELVIS 1-2 VIEWS COMPARISON:  03/10/2016 FINDINGS: Single frontal view of the pelvis submitted. The patient is status post intraoperative repair of left femoral neck fracture. Again noted 3 metallic fixation screws along left  femoral neck. There is anatomic alignment. Stable metallic fixation screws in right proximal femur. IMPRESSION: Status post intraoperative  repair of left femoral neck fracture. Three metallic fixation screws are noted in proximal left femur. There is anatomic alignment. Electronically Signed   By: Lahoma Crocker M.D.   On: 03/11/2016 09:50   Ct Hip Left Wo Contrast  03/10/2016  CLINICAL DATA:  Left hip pain after fall. EXAM: CT OF THE LEFT HIP WITHOUT CONTRAST TECHNIQUE: Multidetector CT imaging of the left hip was performed according to the standard protocol. Multiplanar CT image reconstructions were also generated. COMPARISON:  Left hip radiographs earlier this day. FINDINGS: Impaction fracture of the left femoral neck. No significant displacement. Left hip osteoarthritis with acetabular spurring and fragmentation. Pubic rami are intact. No large soft tissue hematoma. Soft tissue edema laterally. Excreted intravenous contrast in the urinary bladder. IMPRESSION: Impaction fracture of the left femoral neck. No significant displacement. Electronically Signed   By: Jeb Levering M.D.   On: 03/10/2016 04:09   Chest Portable 1 View  03/10/2016  CLINICAL DATA:  Preoperative chest radiograph.  Initial encounter. EXAM: PORTABLE CHEST 1 VIEW COMPARISON:  Chest radiograph performed 11/12/2015 FINDINGS: The lungs are well-aerated. Mild bibasilar atelectasis is noted. There is no evidence of pleural effusion or pneumothorax. The cardiomediastinal silhouette is enlarged. No acute osseous abnormalities are seen. IMPRESSION: Mild bibasilar atelectasis noted.  Cardiomegaly seen. Electronically Signed   By: Garald Balding M.D.   On: 03/10/2016 04:04   Dg Knee Complete 4 Views Left  03/10/2016  CLINICAL DATA:  Unwitnessed fall while ambulating with walker. Fall in living room. Left hip and knee pain after fall. EXAM: LEFT KNEE - COMPLETE 4+ VIEW COMPARISON:  None. FINDINGS: No fracture or dislocation. The alignment and joint spaces are maintained. 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 No joint effusion. Small quadriceps tendon enthesophyte. Vascular  calcifications are seen. IMPRESSION: No fracture or subluxation of the left knee. Electronically Signed   By: Jeb Levering M.D.   On: 03/10/2016 00:54   Dg Hip Operative Unilat With Pelvis Left  03/11/2016  CLINICAL DATA:  Operative imaging from the ORIF of left subcapital femoral neck fracture. EXAM: OPERATIVE LEFT HIP (WITH PELVIS IF PERFORMED) 4 VIEWS TECHNIQUE: Fluoroscopic spot image(s) were submitted for interpretation post-operatively. COMPARISON:  03/10/2016 FINDINGS: Images show placement of 3 screws crossing the femoral neck into femoral head supporting the subcapital femoral neck fracture. The orthopedic hardware is well seated. There is no new fracture or evidence of an operative complication. IMPRESSION: Portable imaging from the ORIF of a left femoral neck fracture. Electronically Signed   By: Lajean Manes M.D.   On: 03/11/2016 09:00   Dg Hip Unilat With Pelvis 2-3 Views Left  03/10/2016  CLINICAL DATA:  Unwitnessed fall while ambulating with walker. Fall in living room. Left hip and knee pain after fall. EXAM: DG HIP (WITH OR WITHOUT PELVIS) 2-3V LEFT COMPARISON:  None. FINDINGS: Linear sclerosis involving the subcapital left femoral neck, suspicious for impacted femoral neck fracture. Femoral head remains seated in the acetabulum. Fragmentation multiple lateral acetabulum is similar to prior abdominal radiograph from 2011 and appears chronic. No evidence pubic rami fracture. Multiple partially cannulated screws traverse the right proximal femur. IMPRESSION: Findings suspicious for impacted left femoral neck fracture. Electronically Signed   By: Jeb Levering M.D.   On: 03/10/2016 00:52     CBC  Recent Labs Lab 03/10/16 0022 03/10/16 0058 03/11/16 0258 03/12/16 0301  WBC 8.9  --  6.8 7.5  HGB 10.8* 11.2* 9.9* 10.0*  HCT 32.8* 33.0* 30.1* 30.5*  PLT 118*  --  101* 110*  MCV 112.3*  --  112.7* 112.5*  MCH 37.0*  --  37.1* 36.9*  MCHC 32.9  --  32.9 32.8  RDW 14.5  --  14.4  14.5  LYMPHSABS 1.4  --   --   --   MONOABS 0.5  --   --   --   EOSABS 0.2  --   --   --   BASOSABS 0.0  --   --   --     Chemistries   Recent Labs Lab 03/10/16 0022 03/10/16 0058 03/11/16 0258 03/12/16 0301  NA 137 140 135 137  K 4.7 4.7 4.7 4.7  CL 104 102 107 103  CO2 28  --  23 27  GLUCOSE 124* 120* 121* 115*  BUN 18 20 19 20   CREATININE 1.50* 1.50* 1.59* 1.72*  CALCIUM 9.3  --  8.4* 8.7*  AST 58*  --  41 36  ALT 48  --  38 31  ALKPHOS 38  --  31* 36*  BILITOT 1.0  --  1.6* 1.3*   ------------------------------------------------------------------------------------------------------------------ estimated creatinine clearance is 29.9 mL/min (by C-G formula based on Cr of 1.72). ------------------------------------------------------------------------------------------------------------------ No results for input(s): HGBA1C in the last 72 hours. ------------------------------------------------------------------------------------------------------------------ No results for input(s): CHOL, HDL, LDLCALC, TRIG, CHOLHDL, LDLDIRECT in the last 72 hours. ------------------------------------------------------------------------------------------------------------------ No results for input(s): TSH, T4TOTAL, T3FREE, THYROIDAB in the last 72 hours.  Invalid input(s): FREET3 ------------------------------------------------------------------------------------------------------------------ No results for input(s): VITAMINB12, FOLATE, FERRITIN, TIBC, IRON, RETICCTPCT in the last 72 hours.  Coagulation profile  Recent Labs Lab 03/06/16 1028 03/10/16 0022 03/10/16 1111 03/11/16 0258  INR 4.1 3.07* 1.85* 1.54*    No results for input(s): DDIMER in the last 72 hours.  Cardiac Enzymes No results for input(s): CKMB, TROPONINI, MYOGLOBIN in the last 168 hours.  Invalid input(s):  CK ------------------------------------------------------------------------------------------------------------------ Invalid input(s): POCBNP   CBG: No results for input(s): GLUCAP in the last 168 hours.     Studies: Pelvis Portable  03/11/2016  CLINICAL DATA:  Postop left hip EXAM: PORTABLE PELVIS 1-2 VIEWS COMPARISON:  03/10/2016 FINDINGS: Single frontal view of the pelvis submitted. The patient is status post intraoperative repair of left femoral neck fracture. Again noted 3 metallic fixation screws along left femoral neck. There is anatomic alignment. Stable metallic fixation screws in right proximal femur. IMPRESSION: Status post intraoperative repair of left femoral neck fracture. Three metallic fixation screws are noted in proximal left femur. There is anatomic alignment. Electronically Signed   By: Lahoma Crocker M.D.   On: 03/11/2016 09:50   Dg Hip Operative Unilat With Pelvis Left  03/11/2016  CLINICAL DATA:  Operative imaging from the ORIF of left subcapital femoral neck fracture. EXAM: OPERATIVE LEFT HIP (WITH PELVIS IF PERFORMED) 4 VIEWS TECHNIQUE: Fluoroscopic spot image(s) were submitted for interpretation post-operatively. COMPARISON:  03/10/2016 FINDINGS: Images show placement of 3 screws crossing the femoral neck into femoral head supporting the subcapital femoral neck fracture. The orthopedic hardware is well seated. There is no new fracture or evidence of an operative complication. IMPRESSION: Portable imaging from the ORIF of a left femoral neck fracture. Electronically Signed   By: Lajean Manes M.D.   On: 03/11/2016 09:00      Lab Results  Component Value Date   HGBA1C 5.7* 11/13/2015   HGBA1C 5.5 06/02/2013   HGBA1C 5.8 09/06/2012   Lab Results  Component Value Date  MICROALBUR 16.2* 11/22/2015   LDLCALC 74 11/13/2015   CREATININE 1.72* 03/12/2016       Scheduled Meds: . docusate sodium  100 mg Oral QHS  . enoxaparin (LOVENOX) injection  40 mg Subcutaneous  Q24H  . fenofibrate  160 mg Oral Daily  . gabapentin  200 mg Oral QHS  . gabapentin  300 mg Oral Daily  . loratadine  10 mg Oral Daily  . pantoprazole  40 mg Oral Daily  . simvastatin  40 mg Oral Daily  . venlafaxine  75 mg Oral BID WC  . Warfarin - Pharmacist Dosing Inpatient   Does not apply q1800   Continuous Infusions: . sodium chloride 75 mL/hr at 03/11/16 1848     LOS: 2 days    Time spent: >30 MINS    [facility] Pager [phone]. If 7PM-7AM, please contact night-coverage at [email or web address], password [facility] 03/12/2016, 9:28 AM  LOS: 2 days

## 2016-03-12 NOTE — Progress Notes (Addendum)
New Tele Box applied [phone] - Central notified

## 2016-03-12 NOTE — Progress Notes (Signed)
C&S obtained for culture, as well as dressing was change due to it be wet per Dr

## 2016-03-12 NOTE — Progress Notes (Signed)
Per Dr Allyson Sabal safety sitter discontinued

## 2016-03-12 NOTE — Progress Notes (Signed)
ANTICOAGULATION CONSULT NOTE - Initial Consult  Pharmacy Consult for Warfarin/Lovenox Indication: pulmonary embolus and VTE prophylaxis  Allergies  Allergen Reactions  . Lactose Intolerance (Gi)   . Nizatidine     REACTION: Rash (Axid)  . Sulfadiazine     REACTION: Hives  . Aricept [Donepezil Hcl] Rash  . Lipitor [Atorvastatin] Rash  . Penicillins Rash    Has patient had a PCN reaction causing immediate rash, facial/tongue/throat swelling, SOB or lightheadedness with hypotension: No Has patient had a PCN reaction causing severe rash involving mucus membranes or skin necrosis: No Has patient had a PCN reaction that required hospitalization No Has patient had a PCN reaction occurring within the last 10 years: No If all of the above answers are "NO",  then may proceed with Cephalosporin use.    Patient Measurements: Height: 5\' 6"  (167.6 cm) Weight: 160 lb (72.576 kg) IBW/kg (Calculated) : 63.8   Vital Signs: Temp: 99.7 F (37.6 C) (07/05 0631) Temp Source: Oral (07/05 0631) BP: 130/53 mmHg (07/05 0631) Pulse Rate: 88 (07/05 0631)  Labs:  Recent Labs  03/10/16 0022 03/10/16 0058 03/10/16 1111 03/11/16 0258 03/12/16 0301 03/12/16 0955  HGB 10.8* 11.2*  --  9.9* 10.0*  --   HCT 32.8* 33.0*  --  30.1* 30.5*  --   PLT 118*  --   --  101* 110*  --   LABPROT 31.1*  --  21.3* 18.5*  --  16.1*  INR 3.07*  --  1.85* 1.54*  --  1.27  CREATININE 1.50* 1.50*  --  1.59* 1.72*  --     Estimated Creatinine Clearance: 29.9 mL/min (by C-G formula based on Cr of 1.72).   Medical History: Past Medical History  Diagnosis Date  . GERD (gastroesophageal reflux disease)     h/o PUD  . CAD (coronary artery disease)     cath 2000 30% single vessel, normal nuclear stress test 12/03/2010, no evidence ischemia  . CKD (chronic kidney disease) stage 3, GFR 30-59 ml/min     baseline Cr 1.7  . Depression   . Hx pulmonary embolism 08/1991    negative hypercoagulable panel 12/2014  .  History of phlebitis   . Allergic rhinitis   . Urge incontinence   . Elevated PSA     previous-normalized (followed by Dr. Jeffie Pollock, rec no repeat unless urinary sxs)  . HLD (hyperlipidemia)     hypertriglyceridemia  . Asthma     remote  . Arthritis   . Blood transfusion 1990's  . Schatzki's ring 02/2012    s/p dilation [name]  . History of fracture of right hip   . Hearing loss 06/2012    eval - rec annual exam  . Essential tremor 09/27/2008    reviewed eval by Dr Jannifer Franklin in chart 2011   . Prediabetes   . DISH (diffuse idiopathic skeletal hyperostosis) 03/2013    lumbar spine on xray  . DDD (degenerative disc disease) 03/2013    by CT, diffuse multilevel cervical and lumbar spondylosis  . Osteoporosis 11/2013    DEXA T score -2.7  . Macrocytic anemia 2014    stable B12/folate and periph smear 05/2013, again periph smear 2017 with mature cells, mild dacrocytosis  . Sleep apnea     no CPAP- sleep apnea "cleared up 15 years ago"  . Dizziness     multifactorial, s/p PT at [facility] 06/2014 with HEP  . History of pyelonephritis 05/2014    hospitalization with sepsis  . Closed C7  fracture ([place]) 11/06/2014  . UTI (urinary tract infection) 05/13/2015  . UTI (urinary tract infection) due to Enterococcus 05/13/2015  . Candidal urethritis in male 06/15/2015  . Stroke [facility])     Medications:  Scheduled:  .  ceFAZolin (ANCEF) IV  2 g Intravenous On Call to OR  . chlorhexidine  60 mL Topical Once  . chlorhexidine  60 mL Topical Once  . clindamycin (CLEOCIN) IV  900 mg Intravenous On Call to OR  . docusate sodium  100 mg Oral QHS  . enoxaparin (LOVENOX) injection  40 mg Subcutaneous Q24H  . fenofibrate  160 mg Oral Daily  . gabapentin  200 mg Oral QHS  . gabapentin  300 mg Oral Daily  . loratadine  10 mg Oral Daily  . pantoprazole  40 mg Oral Daily  . povidone-iodine  2 application Topical Once  . povidone-iodine  2 application Topical Once  . simvastatin  40 mg Oral Daily  . venlafaxine  75 mg  Oral BID WC  . Warfarin - Pharmacist Dosing Inpatient   Does not apply q1800   Infusions:  . sodium chloride 75 mL/hr at 03/11/16 1848  . lactated ringers     PRN: acetaminophen **OR** acetaminophen, albuterol, HYDROcodone-acetaminophen, magnesium citrate, menthol-cetylpyridinium **OR** phenol, methocarbamol **OR** methocarbamol (ROBAXIN)  IV, metoCLOPramide **OR** metoCLOPramide (REGLAN) injection, morphine injection, ondansetron **OR** ondansetron (ZOFRAN) IV, sorbitol  Assessment: 84 YOM on warfarin PTA for hx PE and TIA, s/p vit K 5 mg on 7/3. Warfarin held and started on lovenox (ppx dose) on 7/3 for VTE ppx when INR <2. Now to restart Warfarin.   INR currently subtherapeutic at 1.27, Hgb 10, Plts 110, no bleeding noted. Per Dr Allyson Sabal keep on lovenox ppx bridge until INR > 2.    PTA Warfarin Dose: Take 3.75 mg on Monday and Friday then take 2.5 mg all the other days (Last Dose 7/1)  Goal of Therapy:  INR 2-3 Monitor platelets by anticoagulation protocol: Yes   Plan:  Warfarin 5 mg x 1 tonight Lovenox 40 mg daily (VTE prophylaxis dose per consult) when INR <2   Daily INR, CBC Monitor for s/sx of bleeding    Bennye Alm, PharmD Pharmacy Resident [phone]

## 2016-03-12 NOTE — NC FL2 (Signed)
[place] LEVEL OF CARE SCREENING TOOL     IDENTIFICATION  Patient Name: [name] Birthdate: Feb 19, 1931 Sex: male Admission Date (Current Location): 03/09/2016  [facility] and [place] Number:  Herbalist and Address:  [facility] [place], [place] [address] [place], [place] [address]      Provider Number: [id number]  Attending Physician Name and Address:  Reyne Dumas, MD  Relative Name and Phone Number:       Current Level of Care: Hospital Recommended Level of Care: [place] Prior Approval Number:    Date Approved/Denied:   PASRR Number:  [id number]   Discharge Plan: SNF    Current Diagnoses: Patient Active Problem List   Diagnosis Date Noted  . Left displaced femoral neck fracture ([place]) 03/11/2016  . Fracture   . Femoral neck fracture, left, closed, initial encounter 03/10/2016  . Dysuria 02/07/2016  . Right flank pain 12/18/2015  . Acute ischemic stroke ([place]) 11/16/2015  . 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 Schatzki's ring   . Esophageal stenosis   . CHF (congestive heart failure) ([place]) 11/12/2015  . Sacral ulcer   . Skin rash 04/09/2015  . Chest pain at rest 03/29/2015  . Pedal edema 02/07/2015  . CN (constipation) 01/15/2015  . Orthostatic hypotension 11/24/2014  . Syncope 11/07/2014  . Advanced care planning/counseling discussion 11/06/2014  . Daytime somnolence 11/06/2014  . Macrocytic anemia   . History of colonic polyps 03/27/2014  . Trigger finger, acquired 01/05/2014  . Osteoporosis 11/06/2013  . Dementia 06/08/2013  . Vertigo 01/28/2013  . Bilateral hearing loss 06/07/2012  . GERD (gastroesophageal reflux disease) 04/30/2012  . Dysphagia 12/30/2011  . HLD (hyperlipidemia) 12/08/2011  . MDD (major depressive disorder), recurrent episode, moderate ([place])   . Medicare annual wellness visit, subsequent 06/09/2011  . UNSTEADY GAIT 04/16/2010  . Vitamin B12 deficiency 11/01/2008  .  Vitamin D deficiency 11/01/2008  . Essential tremor 09/27/2008  . HSV 03/08/2007  . Thrombocytopenia ([place]) 03/08/2007  . Coronary atherosclerosis 03/08/2007  . CKD (chronic kidney disease) stage 3, GFR 30-59 ml/min 03/08/2007  . INCONTINENCE, URGE 03/08/2007  . RENAL CALCULUS, HX OF 03/08/2007  . THROMBOPHLEBITIS 01/04/2007  . Personal history of pulmonary embolism 09/08/1990    Orientation RESPIRATION BLADDER Height & Weight     Self, Time, Situation  O2 Incontinent Weight: 160 lb (72.576 kg) Height:  5\' 6"  (167.6 cm)  BEHAVIORAL SYMPTOMS/MOOD NEUROLOGICAL BOWEL NUTRITION STATUS   (none)  (None) Continent Diet (Regular)  AMBULATORY STATUS COMMUNICATION OF NEEDS Skin   Extensive Assist Verbally Surgical wounds (Incision Closed: LT Hip silver hydrofiber)                       Personal Care Assistance Level of Assistance  Bathing, Dressing, Feeding Bathing Assistance: Limited assistance Feeding assistance: Independent Dressing Assistance: Limited assistance     Functional Limitations Info  Sight, Hearing, Speech Sight Info: Adequate Hearing Info: Adequate Speech Info: Adequate    SPECIAL CARE FACTORS FREQUENCY  PT (By licensed PT), OT (By licensed OT)     PT Frequency: 5/ week OT Frequency: 5/ week            Contractures Contractures Info: Not present    Additional Factors Info  Psychotropic (clindamycin (CLEOCIN) IVPB 900 mg Dose: 900 mg Freq: On call to O.R. Route: IV, ceFAZolin (ANCEF) IVPB 2g/100 mL premix Dose: 2 g Freq: On call to O.R. Route: IV) Code Status  Info: Full Allergies Info: Lactose Intolerance (Gi), Nizatidine, Sulfadiazine, Aricept, Lipitor, Penicillins Psychotropic Info: gabapentin (NEURONTIN) capsule 200 mg Dose: 200 mg Freq: Daily at bedtime Route: PO, gabapentin (NEURONTIN) capsule 300 mg Dose: 300 mg Freq: Daily Route: PO         Current Medications (03/12/2016):  This is the current hospital active medication list Current  Facility-Administered Medications  Medication Dose Route Frequency Provider Last Rate Last Dose  . 0.9 %  sodium chloride infusion   Intravenous Continuous Rod Can, MD 75 mL/hr at 03/11/16 1848    . acetaminophen (TYLENOL) tablet 650 mg  650 mg Oral Q6H PRN Rod Can, MD   650 mg at 03/12/16 [id number]   Or  . acetaminophen (TYLENOL) suppository 650 mg  650 mg Rectal Q6H PRN Rod Can, MD      . albuterol (PROVENTIL) (2.5 MG/3ML) 0.083% nebulizer solution 3 mL  3 mL Inhalation Q6H PRN Etta Quill, DO      . ceFAZolin (ANCEF) IVPB 2g/100 mL premix  2 g Intravenous On Call to [place], PA-C      . chlorhexidine (HIBICLENS) 4 % liquid 4 application  60 mL Topical Once Rod Can, MD      . chlorhexidine (HIBICLENS) 4 % liquid 4 application  60 mL Topical Once [employer], PA-C      . clindamycin (CLEOCIN) IVPB 900 mg  900 mg Intravenous On Call to [place], MD      . docusate sodium (COLACE) capsule 100 mg  100 mg Oral QHS Etta Quill, DO   100 mg at 03/11/16 2014  . enoxaparin (LOVENOX) injection 40 mg  40 mg Subcutaneous Q24H Reyne Dumas, MD   40 mg at 03/11/16 2015  . fenofibrate tablet 160 mg  160 mg Oral Daily Etta Quill, DO   160 mg at 03/12/16 [id number]  . gabapentin (NEURONTIN) capsule 200 mg  200 mg Oral QHS Etta Quill, DO   200 mg at 03/11/16 2233  . gabapentin (NEURONTIN) capsule 300 mg  300 mg Oral Daily Reyne Dumas, MD   300 mg at 03/12/16 0926  . HYDROcodone-acetaminophen (NORCO/VICODIN) 5-325 MG per tablet 1-2 tablet  1-2 tablet Oral Q6H PRN Etta Quill, DO   2 tablet at 03/11/16 1411  . lactated ringers infusion   Intravenous Continuous Tracy Shuford, PA-C      . loratadine (CLARITIN) tablet 10 mg  10 mg Oral Daily Etta Quill, DO   10 mg at 03/12/16 [id number]  . magnesium citrate solution 1 Bottle  1 Bottle Oral Once PRN Rod Can, MD      . menthol-cetylpyridinium (CEPACOL) lozenge 3 mg  1 lozenge Oral PRN Rod Can, MD        Or  . phenol (CHLORASEPTIC) mouth spray 1 spray  1 spray Mouth/Throat PRN Rod Can, MD      . methocarbamol (ROBAXIN) tablet 500 mg  500 mg Oral Q6H PRN Rod Can, MD   500 mg at 03/11/16 1846   Or  . methocarbamol (ROBAXIN) 500 mg in dextrose 5 % 50 mL IVPB  500 mg Intravenous Q6H PRN Rod Can, MD      . metoCLOPramide (REGLAN) tablet 5-10 mg  5-10 mg Oral Q8H PRN Rod Can, MD       Or  . metoCLOPramide (REGLAN) injection 5-10 mg  5-10 mg Intravenous Q8H PRN Rod Can, MD      . morphine 2 MG/ML injection 0.5 mg  0.5 mg Intravenous Q2H PRN Etta Quill, DO   0.5 mg at 03/11/16 1340  . ondansetron (ZOFRAN) tablet 4 mg  4 mg Oral Q6H PRN Rod Can, MD       Or  . ondansetron [facility]) injection 4 mg  4 mg Intravenous Q6H PRN Rod Can, MD      . pantoprazole (PROTONIX) EC tablet 40 mg  40 mg Oral Daily Etta Quill, DO   40 mg at 03/12/16 0926  . povidone-iodine 10 % swab 2 application  2 application Topical Once Rod Can, MD      . povidone-iodine 10 % swab 2 application  2 application Topical Once [employer], PA-C      . simvastatin (ZOCOR) tablet 40 mg  40 mg Oral Daily Etta Quill, DO   40 mg at 03/12/16 0926  . sorbitol 70 % solution 30 mL  30 mL Oral Daily PRN Rod Can, MD      . venlafaxine [facility]) tablet 75 mg  75 mg Oral BID WC Etta Quill, DO   75 mg at 03/12/16 0826  . warfarin (COUMADIN) tablet 5 mg  5 mg Oral ONCE-1800 Kelsy E Combs, RPH      . Warfarin - Pharmacist Dosing Inpatient   Does not apply q1800 Kem Parkinson, RPH   0  at 03/11/16 1800     Discharge Medications: Please see discharge summary for a list of discharge medications.  Relevant Imaging Results:  Relevant Lab Results:   Additional Information SSN: [id number]  Samule Dry, LCSW

## 2016-03-12 NOTE — Telephone Encounter (Signed)
    Expand All Collapse All   Moved 7/7 lab/fu to 7/12 due to [place] covering AP. Appointments restored to 7/7 due to [place] only covering AP in the PM. Left message for patient confirming 7/7 appointment and asking that he disregard any mychart alert regarding appointment change.

## 2016-03-12 NOTE — Clinical Social Work Note (Addendum)
CSW gave patient's wife bed offers. CSW spoke with Broadus John with [facility]. He reported [facility] will have a bed available when patient is medically stable.  [employer], LCSW [phone]

## 2016-03-12 NOTE — Evaluation (Signed)
Physical Therapy Evaluation Patient Details Name: [name] MRN: [id number] DOB: 1930/12/24 Today's Date: 03/12/2016   History of Present Illness  pt presents after a fall sustaining L Femoral Neck fx, now post Cannulated Pinning.    Clinical Impression  Pt generally weak and at this time requiring 2 person A for mobility.  Discussed with pt's wife need for continued therapies at D/C and wife in agreement.  Feel pt would benefit from SNF level of therapies at D/C to decrease overall burden of care.      Follow Up Recommendations SNF    Equipment Recommendations  None recommended by PT    Recommendations for Other Services       Precautions / Restrictions Precautions Precautions: Fall Restrictions Weight Bearing Restrictions: Yes LLE Weight Bearing: Partial weight bearing LLE Partial Weight Bearing Percentage or Pounds: 50%      Mobility  Bed Mobility Overal bed mobility: Needs Assistance;+2 for physical assistance Bed Mobility: Supine to Sit     Supine to sit: Mod assist;+2 for physical assistance;HOB elevated     General bed mobility comments: pt does participate with movements of LEs and trunk, but needs A for all aspects of bed mobility.    Transfers Overall transfer level: Needs assistance Equipment used: Rolling walker (2 wheeled);2 person hand held assist Transfers: Sit to/from [facility] Sit to Stand: Mod assist;+2 physical assistance Stand pivot transfers: Mod assist;+2 physical assistance       General transfer comment: Initially stodd with RW, but pt unable to unweight LEs for taking steps.  Can to stand 2nd time without AD and 2 person A.  pt able to A with taking pivotal steps towards recliner on R.    Ambulation/Gait                Stairs            Wheelchair Mobility    Modified Rankin (Stroke Patients Only)       Balance Overall balance assessment: Needs assistance Sitting-balance support: Single  extremity supported;Feet supported;No upper extremity supported Sitting balance-Leahy Scale: Fair     Standing balance support: Bilateral upper extremity supported;During functional activity Standing balance-Leahy Scale: Poor                               Pertinent Vitals/Pain Pain Assessment: Faces Faces Pain Scale: Hurts little more Pain Location: Did not rate when asked, guards L hip during mobility.   Pain Descriptors / Indicators: Grimacing;Guarding Pain Intervention(s): Monitored during session;Premedicated before session;Repositioned    Home Living Family/patient expects to be discharged to:: Skilled nursing facility Living Arrangements: Spouse/significant other                    Prior Function Level of Independence: Needs assistance   Gait / Transfers Assistance Needed: walks modified Independent with RW; uses rollator when going out; step stool up into bed (wife holds std walker for him to use as a rail as he steps up sideways onto step)  ADL's / Homemaking Assistance Needed: supervision for shower transfer; assist with bathing and dressing; uses velcro shoes and elastic pants        Hand Dominance        Extremity/Trunk Assessment   Upper Extremity Assessment: Defer to OT evaluation           Lower Extremity Assessment: Generalized weakness;LLE deficits/detail   LLE Deficits / Details: pt with  intact sensation and strength grossly >3/5, however not assessed due to pain and guarding.    Cervical / Trunk Assessment: Kyphotic  Communication   Communication: No difficulties  Cognition Arousal/Alertness: Awake/alert Behavior During Therapy: WFL for tasks assessed/performed Overall Cognitive Status: History of cognitive impairments - at baseline                      General Comments      Exercises General Exercises - Lower Extremity Long Arc Quad: AROM;Left;10 reps Hip Flexion/Marching: AROM;Left;10 reps       Assessment/Plan    PT Assessment Patient needs continued PT services  PT Diagnosis Difficulty walking;Generalized weakness   PT Problem List Decreased strength;Decreased activity tolerance;Decreased balance;Decreased mobility;Decreased knowledge of use of DME;Pain  PT Treatment Interventions DME instruction;Gait training;Stair training;Functional mobility training;Therapeutic activities;Therapeutic exercise;Balance training;Patient/family education   PT Goals (Current goals can be found in the Care Plan section) Acute Rehab PT Goals Patient Stated Goal: Per wife for pt to get rehab then return to home. PT Goal Formulation: With patient Time For Goal Achievement: 03/26/16 Potential to Achieve Goals: Good    Frequency Min 3X/week   Barriers to discharge        Co-evaluation               End of Session Equipment Utilized During Treatment: Gait belt;Oxygen Activity Tolerance: Patient tolerated treatment well Patient left: in chair;with call bell/phone within reach;with family/visitor present Nurse Communication: Mobility status         Time: [id number] PT Time Calculation (min) (ACUTE ONLY): 34 min   Charges:   PT Evaluation $PT Eval Moderate Complexity: 1 Procedure PT Treatments $Therapeutic Activity: 8-22 mins   PT G CodesCatarina Hartshorn, [place] [id number] 03/12/2016, 10:11 AM

## 2016-03-12 NOTE — Evaluation (Signed)
Occupational Therapy Evaluation and Discharge Patient Details Name: [name] MRN: [id number] DOB: 1931-03-17 Today's Date: 03/12/2016    History of Present Illness pt presents after a fall sustaining L Femoral Neck fx, now post Cannulated Pinning.     Clinical Impression   Pt is a pleasant 82 y/o male with baseline benign tremor and cognitive deficits, who was modified independent PTA for ADL. Pt needed moderate assistance to sit EOB for ADL and is planning on d/c to SNF. All OT needs can be met in the next venue of care.     Follow Up Recommendations  Other (comment) (next venue of care to determine)    Equipment Recommendations  None recommended by OT    Recommendations for Other Services       Precautions / Restrictions Precautions Precautions: Fall Restrictions Weight Bearing Restrictions: Yes LLE Weight Bearing: Partial weight bearing LLE Partial Weight Bearing Percentage or Pounds: 50%      Mobility Bed Mobility Overal bed mobility: Needs Assistance Bed Mobility: Supine to Sit     Supine to sit: Mod assist;+2 for physical assistance;HOB elevated (used bed rails for assist, and vebal cues for safety)     General bed mobility comments: pt does participate with movements of LEs and trunk, but needs A for all aspects of bed mobility.    Transfers                      Balance Overall balance assessment: Needs assistance Sitting-balance support: Single extremity supported Sitting balance-Leahy Scale: Fair Sitting balance - Comments: Pt benefitted from vc for hand placement and reaching as a way to shift weight                                    ADL Overall ADL's : Needs assistance/impaired   Eating/Feeding Details (indicate cue type and reason): PTA Pt uses weighted utensils for eating, during session, Pt's wife held drink with straw for him to drink Grooming: Moderate assistance;Set up       Lower Body Bathing: Maximal  assistance Lower Body Bathing Details (indicate cue type and reason): Pt has AE at home to perform LB bathing, but wife typically does it for him PTA     Lower Body Dressing: Maximal assistance Lower Body Dressing Details (indicate cue type and reason): Pt unable to reach down to feet/bring foot up for dressing. Pt reports that they have AE at home (grabber and sock donner) for LB dressing, and wife is willing and able to assist in LB dressing             Functional mobility during ADLs: Moderate assistance (from supine to sit)       Vision     Perception     Praxis      Pertinent Vitals/Pain Pain Assessment: Faces Faces Pain Scale: Hurts little more Pain Descriptors / Indicators: Grimacing;Operative site guarding Pain Intervention(s): Monitored during session;Premedicated before session;Repositioned     Hand Dominance     Extremity/Trunk Assessment Upper Extremity Assessment Upper Extremity Assessment: Overall WFL for tasks assessed (Pt with benign tremor - PTA)   Lower Extremity Assessment Lower Extremity Assessment: LLE deficits/detail;Defer to PT evaluation       Communication Communication Communication: No difficulties   Cognition Arousal/Alertness: Awake/alert Behavior During Therapy: WFL for tasks assessed/performed Overall Cognitive Status: History of cognitive impairments - at baseline  General Comments       Exercises       Shoulder Instructions      Home Living Family/patient expects to be discharged to:: Skilled nursing facility Living Arrangements: Spouse/significant other                                      Prior Functioning/Environment Level of Independence: Needs assistance  Gait / Transfers Assistance Needed: walks modified Independent with RW; uses rollator when going out; step stool up into bed (wife holds std walker for him to use as a rail as he steps up sideways onto step) Pt has 3in1  over toilet, and grab bars in bathroom for bathroom transfers and wife assists with in and out of tub at home ADL's / Homemaking Assistance Needed: supervision for shower transfer; assist with bathing and dressing; uses velcro shoes and elastic pants        OT Diagnosis: Generalized weakness;Acute pain;Cognitive deficits   OT Problem List: Decreased strength;Decreased activity tolerance;Pain;Decreased cognition   OT Treatment/Interventions:      OT Goals(Current goals can be found in the care plan section) Acute Rehab OT Goals Patient Stated Goal: Per wife for pt to get rehab then return to home.  OT Frequency:     Barriers to D/C:    Pt to d/c to SNF        Co-evaluation              End of Session Nurse Communication: Other (comment) (Pt's O2 monitor was beeping, and Ot requested RN to help)  Activity Tolerance: Patient limited by fatigue Patient left:     Time: 1325-1352 OT Time Calculation (min): 27 min Charges:  OT General Charges $OT Visit: 1 Procedure OT Evaluation $OT Eval Moderate Complexity: 1 Procedure OT Treatments $Self Care/Home Management : 8-22 mins G-Codes:    Evern Bio Zunaira Lamy 2016-04-05, 2:33 PM

## 2016-03-12 NOTE — Clinical Social Work Note (Signed)
Clinical Social Work Assessment  Patient Details  Name: [name] MRN: [id number] Date of Birth: April 09, 1931  Date of referral:  03/12/16               Reason for consult:  Discharge Planning                Permission sought to share information with:  Facility Sport and exercise psychologist, Family Supports Permission granted to share information::  Yes, Verbal Permission Granted  Name::     Doctor, hospital::  SNFs  Relationship::  Wife  Contact Information:     Housing/Transportation Living arrangements for the past 2 months:  Single Family Home Source of Information:  Patient, Spouse Patient Interpreter Needed:  None Criminal Activity/Legal Involvement Pertinent to Current Situation/Hospitalization:    Significant Relationships:  Spouse Lives with:  Spouse Do you feel safe going back to the place where you live?  Yes Need for family participation in patient care:  Yes (Comment)  Care giving concerns: The patient is aggregable for short term rehab at discharge. Patient would like to rebuild his strength to return home.    Social Worker assessment / plan:  CSW met with patient at beside to complete assessment. Patient was resting comfortably in bed. Patient's wife was also at bedside. CSW explained SNF search and placement process to the patient and answered her questions.  Patient and patient's wife would like placement with [facility]. CSW will follow up with bed offers.   Employment status:  Retired Nurse, adult PT Recommendations:  [place] / Referral to community resources:  [place]  Patient/Family's Response to care:  The patient appears happy with the care he is receiving in the hospital and is appreciative of CSW assistance.  Patient/Family's Understanding of and Emotional Response to Diagnosis, Current Treatment, and Prognosis:  The patient has a good understanding of why he was  admitted. He understands the care plan and what he will need post discharge.  Emotional Assessment Appearance:  Appears stated age Attitude/Demeanor/Rapport:   (Patient was appropriate.) Affect (typically observed):  Accepting, Appropriate, Calm Orientation:  Oriented to Self, Oriented to  Time, Oriented to Situation Alcohol / Substance use:  Not Applicable Psych involvement (Current and /or in the community):  No (Comment)  Discharge Needs  Concerns to be addressed:  Discharge Planning Concerns Readmission within the last 30 days:  No Current discharge risk:  Physical Impairment Barriers to Discharge:  Continued Medical Work up   [employer], LCSW 03/12/2016, 3:19 PM

## 2016-03-12 NOTE — Progress Notes (Signed)
All orders dated July 5th were release in error for [name] with exception of sitter discontinued

## 2016-03-13 ENCOUNTER — Other Ambulatory Visit: Payer: Medicare Other

## 2016-03-13 ENCOUNTER — Other Ambulatory Visit: Payer: Self-pay | Admitting: *Deleted

## 2016-03-13 DIAGNOSIS — D696 Thrombocytopenia, unspecified: Secondary | ICD-10-CM | POA: Diagnosis not present

## 2016-03-13 DIAGNOSIS — K219 Gastro-esophageal reflux disease without esophagitis: Secondary | ICD-10-CM | POA: Diagnosis not present

## 2016-03-13 DIAGNOSIS — D649 Anemia, unspecified: Secondary | ICD-10-CM | POA: Diagnosis not present

## 2016-03-13 DIAGNOSIS — M6281 Muscle weakness (generalized): Secondary | ICD-10-CM | POA: Diagnosis not present

## 2016-03-13 DIAGNOSIS — N189 Chronic kidney disease, unspecified: Secondary | ICD-10-CM | POA: Diagnosis not present

## 2016-03-13 DIAGNOSIS — E039 Hypothyroidism, unspecified: Secondary | ICD-10-CM | POA: Diagnosis not present

## 2016-03-13 DIAGNOSIS — N390 Urinary tract infection, site not specified: Secondary | ICD-10-CM | POA: Diagnosis not present

## 2016-03-13 DIAGNOSIS — S72001A Fracture of unspecified part of neck of right femur, initial encounter for closed fracture: Secondary | ICD-10-CM | POA: Diagnosis not present

## 2016-03-13 DIAGNOSIS — D509 Iron deficiency anemia, unspecified: Secondary | ICD-10-CM | POA: Diagnosis not present

## 2016-03-13 DIAGNOSIS — K5900 Constipation, unspecified: Secondary | ICD-10-CM | POA: Diagnosis not present

## 2016-03-13 DIAGNOSIS — D539 Nutritional anemia, unspecified: Secondary | ICD-10-CM

## 2016-03-13 DIAGNOSIS — Z5189 Encounter for other specified aftercare: Secondary | ICD-10-CM | POA: Diagnosis not present

## 2016-03-13 DIAGNOSIS — T148 Other injury of unspecified body region: Secondary | ICD-10-CM | POA: Diagnosis not present

## 2016-03-13 DIAGNOSIS — E784 Other hyperlipidemia: Secondary | ICD-10-CM | POA: Diagnosis not present

## 2016-03-13 DIAGNOSIS — S72002D Fracture of unspecified part of neck of left femur, subsequent encounter for closed fracture with routine healing: Secondary | ICD-10-CM | POA: Diagnosis not present

## 2016-03-13 DIAGNOSIS — I639 Cerebral infarction, unspecified: Secondary | ICD-10-CM | POA: Diagnosis not present

## 2016-03-13 DIAGNOSIS — I10 Essential (primary) hypertension: Secondary | ICD-10-CM | POA: Diagnosis not present

## 2016-03-13 DIAGNOSIS — S72002A Fracture of unspecified part of neck of left femur, initial encounter for closed fracture: Secondary | ICD-10-CM | POA: Diagnosis not present

## 2016-03-13 DIAGNOSIS — R41841 Cognitive communication deficit: Secondary | ICD-10-CM | POA: Diagnosis not present

## 2016-03-13 DIAGNOSIS — N3941 Urge incontinence: Secondary | ICD-10-CM | POA: Diagnosis not present

## 2016-03-13 DIAGNOSIS — F329 Major depressive disorder, single episode, unspecified: Secondary | ICD-10-CM | POA: Diagnosis not present

## 2016-03-13 DIAGNOSIS — R338 Other retention of urine: Secondary | ICD-10-CM | POA: Diagnosis not present

## 2016-03-13 DIAGNOSIS — K210 Gastro-esophageal reflux disease with esophagitis: Secondary | ICD-10-CM | POA: Diagnosis not present

## 2016-03-13 DIAGNOSIS — Z86711 Personal history of pulmonary embolism: Secondary | ICD-10-CM | POA: Diagnosis not present

## 2016-03-13 DIAGNOSIS — R259 Unspecified abnormal involuntary movements: Secondary | ICD-10-CM | POA: Diagnosis not present

## 2016-03-13 DIAGNOSIS — E569 Vitamin deficiency, unspecified: Secondary | ICD-10-CM | POA: Diagnosis not present

## 2016-03-13 DIAGNOSIS — I2609 Other pulmonary embolism with acute cor pulmonale: Secondary | ICD-10-CM | POA: Diagnosis not present

## 2016-03-13 DIAGNOSIS — E785 Hyperlipidemia, unspecified: Secondary | ICD-10-CM | POA: Diagnosis not present

## 2016-03-13 DIAGNOSIS — S72009A Fracture of unspecified part of neck of unspecified femur, initial encounter for closed fracture: Secondary | ICD-10-CM | POA: Diagnosis not present

## 2016-03-13 DIAGNOSIS — M25559 Pain in unspecified hip: Secondary | ICD-10-CM | POA: Diagnosis not present

## 2016-03-13 DIAGNOSIS — Z4789 Encounter for other orthopedic aftercare: Secondary | ICD-10-CM | POA: Diagnosis not present

## 2016-03-13 LAB — CBC
HEMATOCRIT: 27.5 % — AB (ref 39.0–52.0)
HEMOGLOBIN: 9.1 g/dL — AB (ref 13.0–17.0)
MCH: 37.3 pg — AB (ref 26.0–34.0)
MCHC: 33.1 g/dL (ref 30.0–36.0)
MCV: 112.7 fL — AB (ref 78.0–100.0)
Platelets: 114 10*3/uL — ABNORMAL LOW (ref 150–400)
RBC: 2.44 MIL/uL — ABNORMAL LOW (ref 4.22–5.81)
RDW: 14.5 % (ref 11.5–15.5)
WBC: 5.9 10*3/uL (ref 4.0–10.5)

## 2016-03-13 LAB — PROTIME-INR
INR: 1.4 (ref 0.00–1.49)
PROTHROMBIN TIME: 17.3 s — AB (ref 11.6–15.2)

## 2016-03-13 MED ORDER — WARFARIN SODIUM 7.5 MG PO TABS: 7.5000 mg | ORAL_TABLET | Freq: Once | ORAL | Status: DC

## 2016-03-13 MED ORDER — ENOXAPARIN SODIUM 40 MG/0.4ML ~~LOC~~ SOLN: 40.0000 mg | SUBCUTANEOUS | Status: DC

## 2016-03-13 MED ORDER — SORBITOL 70 % SOLN
30.0000 mL | Freq: Every day | Status: DC | PRN
Start: 2016-03-13 — End: 2016-05-16

## 2016-03-13 MED ORDER — HYDROCODONE-ACETAMINOPHEN 5-325 MG PO TABS: 1.0000 | ORAL_TABLET | Freq: Four times a day (QID) | ORAL | Status: DC | PRN

## 2016-03-13 MED ORDER — WARFARIN SODIUM 5 MG PO TABS: ORAL_TABLET | ORAL | Status: DC

## 2016-03-13 NOTE — Clinical Social Work Note (Signed)
Patient to be discharged to [facility]. Patient's wife updated regarding discharge. Patient to be transported via EMS.  Room: 606 Report to Yaakov Guthrie at [place], [facility]: [phone] Surgical: [phone]

## 2016-03-13 NOTE — Progress Notes (Signed)
ANTICOAGULATION CONSULT NOTE - Initial Consult  Pharmacy Consult for Warfarin/Lovenox Indication: pulmonary embolus and VTE prophylaxis  Allergies  Allergen Reactions  . Lactose Intolerance (Gi)   . Nizatidine     REACTION: Rash (Axid)  . Sulfadiazine     REACTION: Hives  . Aricept [Donepezil Hcl] Rash  . Lipitor [Atorvastatin] Rash  . Penicillins Rash    Has patient had a PCN reaction causing immediate rash, facial/tongue/throat swelling, SOB or lightheadedness with hypotension: No Has patient had a PCN reaction causing severe rash involving mucus membranes or skin necrosis: No Has patient had a PCN reaction that required hospitalization No Has patient had a PCN reaction occurring within the last 10 years: No If all of the above answers are "NO",  then may proceed with Cephalosporin use.    Patient Measurements: Height: 5\' 6"  (167.6 cm) Weight: 160 lb (72.576 kg) IBW/kg (Calculated) : 63.8   Vital Signs: Temp: 98 F (36.7 C) (07/06 0520) Temp Source: Oral (07/06 0520) BP: 124/75 mmHg (07/06 0520) Pulse Rate: 75 (07/06 0520)  Labs:  Recent Labs  03/11/16 0258 03/12/16 0301 03/12/16 0955 03/13/16 0350  HGB 9.9* 10.0*  --  9.1*  HCT 30.1* 30.5*  --  27.5*  PLT 101* 110*  --  114*  LABPROT 18.5*  --  16.1* 17.3*  INR 1.54*  --  1.27 1.40  CREATININE 1.59* 1.72*  --   --     Estimated Creatinine Clearance: 29.9 mL/min (by C-G formula based on Cr of 1.72).   Medical History: Past Medical History  Diagnosis Date  . GERD (gastroesophageal reflux disease)     h/o PUD  . CAD (coronary artery disease)     cath 2000 30% single vessel, normal nuclear stress test 12/03/2010, no evidence ischemia  . CKD (chronic kidney disease) stage 3, GFR 30-59 ml/min     baseline Cr 1.7  . Depression   . Hx pulmonary embolism 08/1991    negative hypercoagulable panel 12/2014  . History of phlebitis   . Allergic rhinitis   . Urge incontinence   . Elevated PSA    previous-normalized (followed by Dr. Jeffie Pollock, rec no repeat unless urinary sxs)  . HLD (hyperlipidemia)     hypertriglyceridemia  . Asthma     remote  . Arthritis   . Blood transfusion 1990's  . Schatzki's ring 02/2012    s/p dilation [name]  . History of fracture of right hip   . Hearing loss 06/2012    eval - rec annual exam  . Essential tremor 09/27/2008    reviewed eval by Dr Jannifer Franklin in chart 2011   . Prediabetes   . DISH (diffuse idiopathic skeletal hyperostosis) 03/2013    lumbar spine on xray  . DDD (degenerative disc disease) 03/2013    by CT, diffuse multilevel cervical and lumbar spondylosis  . Osteoporosis 11/2013    DEXA T score -2.7  . Macrocytic anemia 2014    stable B12/folate and periph smear 05/2013, again periph smear 2017 with mature cells, mild dacrocytosis  . Sleep apnea     no CPAP- sleep apnea "cleared up 15 years ago"  . Dizziness     multifactorial, s/p PT at [facility] 06/2014 with HEP  . History of pyelonephritis 05/2014    hospitalization with sepsis  . Closed C7 fracture ([place]) 11/06/2014  . UTI (urinary tract infection) 05/13/2015  . UTI (urinary tract infection) due to Enterococcus 05/13/2015  . Candidal urethritis in male 06/15/2015  . Stroke (  [place])     Medications:  Scheduled:  . chlorhexidine  60 mL Topical Once  . docusate sodium  100 mg Oral QHS  . enoxaparin (LOVENOX) injection  40 mg Subcutaneous Q24H  . fenofibrate  160 mg Oral Daily  . gabapentin  200 mg Oral QHS  . gabapentin  300 mg Oral Daily  . loratadine  10 mg Oral Daily  . pantoprazole  40 mg Oral Daily  . povidone-iodine  2 application Topical Once  . povidone-iodine  2 application Topical Once  . simvastatin  40 mg Oral Daily  . venlafaxine  75 mg Oral BID WC  . Warfarin - Pharmacist Dosing Inpatient   Does not apply q1800   Infusions:  . sodium chloride 75 mL/hr at 03/11/16 1848  . lactated ringers     PRN: acetaminophen **OR** acetaminophen, albuterol, HYDROcodone-acetaminophen,  magnesium citrate, menthol-cetylpyridinium **OR** phenol, methocarbamol **OR** methocarbamol (ROBAXIN)  IV, metoCLOPramide **OR** metoCLOPramide (REGLAN) injection, morphine injection, ondansetron **OR** ondansetron (ZOFRAN) IV, sorbitol  Assessment: 84 YOM on warfarin PTA for hx PE and TIA, s/p vit K 5 mg on 7/3. Warfarin held and started on lovenox (ppx dose) on 7/3 for VTE ppx when INR <2. Now to restart Warfarin.   INR currently subtherapeutic at 1.40 (likely due to receiving Vitamin K on 7/3), Hgb 9.1, Plts 114, no bleeding noted.  Per Dr Allyson Sabal keep on lovenox ppx bridge until INR > 2.    PTA Warfarin Dose: Take 3.75 mg on Monday and Friday then take 2.5 mg all the other days (Last Dose 7/1)  Goal of Therapy:  INR 2-3 Monitor platelets by anticoagulation protocol: Yes   Plan:  Warfarin 7.5 mg x 1 tonight Lovenox 40 mg daily (VTE prophylaxis dose per consult) when INR <2   Daily INR, CBC Monitor for s/sx of bleeding    Bennye Alm, PharmD Pharmacy Resident [phone]

## 2016-03-13 NOTE — Progress Notes (Signed)
.     Subjective:  Patient reports pain as mild to moderate.  No c/o.  Objective:   VITALS:   Filed Vitals:   03/12/16 0631 03/12/16 1420 03/12/16 2047 03/13/16 0520  BP: 130/53 128/52 129/50 124/75  Pulse: 88 80 77 75  Temp: 99.7 F (37.6 C) 98.3 F (36.8 C) 98.7 F (37.1 C) 98 F (36.7 C)  TempSrc: Oral Oral Oral Oral  Resp:  16 16 16   Height:      Weight:      SpO2: 97% 100% 95% 99%    ABD soft Sensation intact distally Intact pulses distally Dorsiflexion/Plantar flexion intact Incision: dressing C/D/I Compartment soft   Lab Results  Component Value Date   WBC 5.9 03/13/2016   HGB 9.1* 03/13/2016   HCT 27.5* 03/13/2016   MCV 112.7* 03/13/2016   PLT 114* 03/13/2016   BMET    Component Value Date/Time   NA 137 03/12/2016 0301   NA 142 12/14/2015 1050   NA 137 11/17/2014   K 4.7 03/12/2016 0301   K 4.7 12/14/2015 1050   CL 103 03/12/2016 0301   CO2 27 03/12/2016 0301   CO2 28 12/14/2015 1050   GLUCOSE 115* 03/12/2016 0301   GLUCOSE 107 12/14/2015 1050   BUN 20 03/12/2016 0301   BUN 21.2 12/14/2015 1050   BUN 46* 11/17/2014   CREATININE 1.72* 03/12/2016 0301   CREATININE 1.7* 12/14/2015 1050   CREATININE 1.12 12/15/2014 1503   CREATININE 1.7* 11/17/2014   CALCIUM 8.7* 03/12/2016 0301   CALCIUM 9.9 12/14/2015 1050   GFRNONAA 35* 03/12/2016 0301   GFRAA 41* 03/12/2016 0301     Assessment/Plan: 2 Days Post-Op   Active Problems:   Femoral neck fracture, left, closed, initial encounter   Left displaced femoral neck fracture (HCC)   Fracture   50% WB LLE with walker Coumadin with lovenox bridge, d/c loven0x when INR > 1.8 PT/OT Dispo: d/c planning   [name], Horald Pollen 03/13/2016, 1:11 PM   Rod Can, MD Cell [phone]

## 2016-03-13 NOTE — Progress Notes (Signed)
Called report to Yaakov Guthrie [phone] at [facility]. Pt currently resting in bed. Wife at bedside. Awaiting transport.

## 2016-03-13 NOTE — Progress Notes (Signed)
Physical Therapy Treatment Patient Details Name: [name] MRN: [id number] DOB: 02/21/1931 Today's Date: 03/13/2016    History of Present Illness pt presents after a fall sustaining L Femoral Neck fx, now post Cannulated Pinning.      PT Comments    Pt reported no pain at beginning of session. He required the same level of assistance with transfers as previous session. However, he was able to weight shift and slide his feet forwards slightly in standing. He was still unable to take any steps with use of RW and physical assistance. Pt would continue to benefit from skilled PT services while inpatient and after d/c from hospital.  Follow Up Recommendations  SNF     Equipment Recommendations  None recommended by PT    Recommendations for Other Services       Precautions / Restrictions Precautions Precautions: Fall Restrictions Weight Bearing Restrictions: Yes LLE Weight Bearing: Partial weight bearing LLE Partial Weight Bearing Percentage or Pounds: 50%    Mobility  Bed Mobility Overal bed mobility: Needs Assistance Bed Mobility: Supine to Sit     Supine to sit: Mod assist;+2 for physical assistance;HOB elevated     General bed mobility comments: pt does participate with movements of LEs and trunk, but needs A for all aspects of bed mobility.    Transfers Overall transfer level: Needs assistance Equipment used: Rolling walker (2 wheeled);2 person hand held assist Transfers: Sit to/from [facility] Sit to Stand: Mod assist;+2 physical assistance Stand pivot transfers: Mod assist;+2 physical assistance       General transfer comment: Pt stood with RW and was able to laterally weight shift to scoot his feet forwards slightly in a shuffling manner. However, unable to take a normal step to assist with transfer or to ambulate. Pt began to sit prematurely during first stand and required physical assistance to return to sitting EOB. Pt then transferred  from the bed to chair via SPT with +2 A   Ambulation/Gait                 Stairs            Wheelchair Mobility    Modified Rankin (Stroke Patients Only)       Balance Overall balance assessment: Needs assistance Sitting-balance support: Bilateral upper extremity supported Sitting balance-Leahy Scale: Poor Sitting balance - Comments: Pt benefitted from vc for hand placement and reaching as a way to shift weight   Standing balance support: Bilateral upper extremity supported Standing balance-Leahy Scale: Poor                      Cognition Arousal/Alertness: Awake/alert Behavior During Therapy: WFL for tasks assessed/performed Overall Cognitive Status: History of cognitive impairments - at baseline                      Exercises General Exercises - Lower Extremity Ankle Circles/Pumps: AROM;10 reps (bilaterally) Long Arc Quad: AROM;Left;10 reps Hip ABduction/ADduction: AROM;Left;5 reps Hip Flexion/Marching: AROM;Left;10 reps    General Comments        Pertinent Vitals/Pain Pain Assessment: No/denies pain (at beginning of session; however, grimacing during transfers) Pain Intervention(s): Limited activity within patient's tolerance;Monitored during session    Home Living                      Prior Function            PT Goals (current goals can now be  found in the care plan section) Acute Rehab PT Goals Patient Stated Goal: Per wife for pt to get rehab then return to home. PT Goal Formulation: With patient/family Time For Goal Achievement: 03/26/16 Potential to Achieve Goals: Fair Progress towards PT goals: Progressing toward goals    Frequency  Min 3X/week    PT Plan Current plan remains appropriate    Co-evaluation             End of Session Equipment Utilized During Treatment: Gait belt;Oxygen (pt with increased O2 to 3L ) Activity Tolerance: Patient limited by fatigue Patient left: in chair;with call  bell/phone within reach;with family/visitor present     Time: [id number] PT Time Calculation (min) (ACUTE ONLY): 26 min  Charges:  $Therapeutic Activity: 23-37 mins                    G CodesClearnce Sorrel [place] , [place]   [phone]  03/13/2016, 10:29 AM

## 2016-03-13 NOTE — Discharge Summary (Signed)
Physician Discharge Summary  [name] MRN: [id number] DOB/AGE: 01-04-1931 82 y.o.  PCP: Ria Bush, MD   Admit date: 03/09/2016 Discharge date: 03/13/2016  Discharge Diagnoses:     Active Problems:   Femoral neck fracture, left, closed, initial encounter   Left displaced femoral neck fracture (HCC)   Fracture    Follow-up recommendations Follow-up with PCP in 3-5 days , including all  additional recommended appointments as below Follow-up CBC, CMP in 3-5 days Please check INR on a daily basis, discontinue Lovenox when INR greater than 2.0 Check CBC weekly     Current Discharge Medication List    START taking these medications   Details  enoxaparin (LOVENOX) 40 MG/0.4ML injection Inject 0.4 mLs (40 mg total) into the skin daily. Qty: 10 Syringe, Refills: 0    HYDROcodone-acetaminophen (NORCO/VICODIN) 5-325 MG tablet Take 1-2 tablets by mouth every 6 (six) hours as needed for moderate pain. Qty: 30 tablet, Refills: 0    sorbitol 70 % SOLN Take 30 mLs by mouth daily as needed for moderate constipation. Qty: 30 mL, Refills: 3      CONTINUE these medications which have CHANGED   Details  warfarin (COUMADIN) 5 MG tablet 5 mg Monday Wednesday Friday, 2.5 mg on other days Qty: 100 tablet, Refills: 0      CONTINUE these medications which have NOT CHANGED   Details  albuterol (PROVENTIL HFA;VENTOLIN HFA) 108 (90 Base) MCG/ACT inhaler Inhale 2 puffs into the lungs every 6 (six) hours as needed for wheezing or shortness of breath. Qty: 3 Inhaler, Refills: 3    b complex vitamins tablet Take 1 tablet by mouth daily.    Biotin 2500 MCG CAPS Take 1 capsule by mouth daily.    Cholecalciferol (VITAMIN D) 2000 UNITS CAPS Take 1 capsule by mouth daily. Reported on 01/30/2016    clotrimazole (LOTRIMIN) 1 % cream Apply 1 application topically 2 (two) times daily. Qty: 30 g, Refills: 0   Associated Diagnoses: Rash and nonspecific skin eruption    Coenzyme Q10 (COQ-10)  100 MG CAPS Take 1 capsule by mouth daily.     Cyanocobalamin (B-12 PO) Take 10,000 mcg by mouth every Monday, Wednesday, and Friday.     dexlansoprazole (DEXILANT) 60 MG capsule Take 1 capsule (60 mg total) by mouth daily. Qty: 90 capsule, Refills: 2    docusate sodium (COLACE) 100 MG capsule Take 100 mg by mouth at bedtime.     fenofibrate (TRICOR) 145 MG tablet Take 1 tablet (145 mg total) by mouth daily. Qty: 90 tablet, Refills: 2    fexofenadine (ALLEGRA) 180 MG tablet Take 180 mg by mouth daily.     gabapentin (NEURONTIN) 100 MG capsule Take 2 capsules (200 mg total) by mouth 3 (three) times daily. Qty: 540 capsule, Refills: 1    Multiple Vitamins-Minerals (MULTIVITAMIN PO) Take 1 tablet by mouth daily.     simvastatin (ZOCOR) 40 MG tablet Take 1 tablet (40 mg total) by mouth daily. Qty: 90 tablet, Refills: 2    venlafaxine (EFFEXOR) 75 MG tablet Take 1 tablet (75 mg total) by mouth 2 (two) times daily with a meal. Qty: 180 tablet, Refills: 2         Discharge Condition:Stable   Discharge Instructions Get Medicines reviewed and adjusted: Please take all your medications with you for your next visit with your Primary MD  Please request your Primary MD to go over all hospital tests and procedure/radiological results at the follow up, please ask your Primary  MD to get all Hospital records sent to his/her office.  If you experience worsening of your admission symptoms, develop shortness of breath, life threatening emergency, suicidal or homicidal thoughts you must seek medical attention immediately by calling 911 or calling your MD immediately if symptoms less severe.  You must read complete instructions/literature along with all the possible adverse reactions/side effects for all the Medicines you take and that have been prescribed to you. Take any new Medicines after you have completely understood and accpet all the possible adverse reactions/side effects.   Do not  drive when taking Pain medications.   Do not take more than prescribed Pain, Sleep and Anxiety Medications  Special Instructions: If you have smoked or chewed Tobacco in the last 2 yrs please stop smoking, stop any regular Alcohol and or any Recreational drug use.  Wear Seat belts while driving.  Please note  You were cared for by a hospitalist during your hospital stay. Once you are discharged, your primary care physician will handle any further medical issues. Please note that NO REFILLS for any discharge medications will be authorized once you are discharged, as it is imperative that you return to your primary care physician (or establish a relationship with a primary care physician if you do not have one) for your aftercare needs so that they can reassess your need for medications and monitor your lab values.  Discharge Instructions    Diet - low sodium heart healthy    Complete by:  As directed      Increase activity slowly    Complete by:  As directed             Allergies  Allergen Reactions  . Lactose Intolerance (Gi)   . Nizatidine     REACTION: Rash (Axid)  . Sulfadiazine     REACTION: Hives  . Aricept [Donepezil Hcl] Rash  . Lipitor [Atorvastatin] Rash  . Penicillins Rash    Has patient had a PCN reaction causing immediate rash, facial/tongue/throat swelling, SOB or lightheadedness with hypotension: No Has patient had a PCN reaction causing severe rash involving mucus membranes or skin necrosis: No Has patient had a PCN reaction that required hospitalization No Has patient had a PCN reaction occurring within the last 10 years: No If all of the above answers are "NO",  then may proceed with Cephalosporin use.      Disposition: SNF   Consults:  Orthopedics    Significant Diagnostic Studies:  Ct Head Wo Contrast  03/10/2016  CLINICAL DATA:  Hit left parietal-occipital region of the head after fall. Neck pain. Initial encounter. EXAM: CT HEAD WITHOUT  CONTRAST CT CERVICAL SPINE WITHOUT CONTRAST TECHNIQUE: Multidetector CT imaging of the head and cervical spine was performed following the standard protocol without intravenous contrast. Multiplanar CT image reconstructions of the cervical spine were also generated. COMPARISON:  MRI of the brain and CT of the head performed 11/12/2015; CT of the cervical spine performed 11/06/2014 FINDINGS: CT HEAD FINDINGS There is no evidence of acute infarction, mass lesion, or intra- or extra-axial hemorrhage on CT. Prominence of the ventricles and sulci reflects moderate cortical volume loss. Mild cerebellar atrophy is noted. Scattered periventricular and subcortical white matter change likely reflects small vessel ischemic microangiopathy. Incidental note is made of a cavum septum pellucidum. The brainstem and fourth ventricle are within normal limits. The basal ganglia are unremarkable in appearance. The cerebral hemispheres demonstrate grossly normal gray-white differentiation. No mass effect or midline shift is seen. There is  no evidence of fracture; visualized osseous structures are unremarkable in appearance. The visualized portions of the orbits are within normal limits. The paranasal sinuses and mastoid air cells are well-aerated. No significant soft tissue abnormalities are seen. CT CERVICAL SPINE FINDINGS There is no evidence of fracture or subluxation. Vertebral bodies demonstrate normal alignment. There is slight chronic loss of height along the lower cervical vertebral bodies, with prominent anterior osteophytes noted along the cervical spine. Intervertebral disc spaces are preserved. Prevertebral soft tissues are within normal limits. The thyroid gland is unremarkable in appearance. Mild atelectasis is noted at the lung apices. No significant soft tissue abnormalities are seen. IMPRESSION: 1. No evidence of traumatic intracranial injury or fracture. 2. No evidence of fracture or subluxation along the cervical  spine. 3. Moderate cortical volume loss and scattered small vessel ischemic microangiopathy. 4. Degenerative change along the cervical spine. 5. Mild atelectasis at the lung apices. Electronically Signed   By: Garald Balding M.D.   On: 03/10/2016 03:36   Ct Cervical Spine Wo Contrast  03/10/2016  CLINICAL DATA:  Hit left parietal-occipital region of the head after fall. Neck pain. Initial encounter. EXAM: CT HEAD WITHOUT CONTRAST CT CERVICAL SPINE WITHOUT CONTRAST TECHNIQUE: Multidetector CT imaging of the head and cervical spine was performed following the standard protocol without intravenous contrast. Multiplanar CT image reconstructions of the cervical spine were also generated. COMPARISON:  MRI of the brain and CT of the head performed 11/12/2015; CT of the cervical spine performed 11/06/2014 FINDINGS: CT HEAD FINDINGS There is no evidence of acute infarction, mass lesion, or intra- or extra-axial hemorrhage on CT. Prominence of the ventricles and sulci reflects moderate cortical volume loss. Mild cerebellar atrophy is noted. Scattered periventricular and subcortical white matter change likely reflects small vessel ischemic microangiopathy. Incidental note is made of a cavum septum pellucidum. The brainstem and fourth ventricle are within normal limits. The basal ganglia are unremarkable in appearance. The cerebral hemispheres demonstrate grossly normal gray-white differentiation. No mass effect or midline shift is seen. There is no evidence of fracture; visualized osseous structures are unremarkable in appearance. The visualized portions of the orbits are within normal limits. The paranasal sinuses and mastoid air cells are well-aerated. No significant soft tissue abnormalities are seen. CT CERVICAL SPINE FINDINGS There is no evidence of fracture or subluxation. Vertebral bodies demonstrate normal alignment. There is slight chronic loss of height along the lower cervical vertebral bodies, with prominent  anterior osteophytes noted along the cervical spine. Intervertebral disc spaces are preserved. Prevertebral soft tissues are within normal limits. The thyroid gland is unremarkable in appearance. Mild atelectasis is noted at the lung apices. No significant soft tissue abnormalities are seen. IMPRESSION: 1. No evidence of traumatic intracranial injury or fracture. 2. No evidence of fracture or subluxation along the cervical spine. 3. Moderate cortical volume loss and scattered small vessel ischemic microangiopathy. 4. Degenerative change along the cervical spine. 5. Mild atelectasis at the lung apices. Electronically Signed   By: Garald Balding M.D.   On: 03/10/2016 03:36   Ct Abdomen Pelvis W Contrast  03/10/2016  CLINICAL DATA:  Fall striking left side of body on the floor. Right lower quadrant pain. On anticoagulation. EXAM: CT ABDOMEN AND PELVIS WITH CONTRAST TECHNIQUE: Multidetector CT imaging of the abdomen and pelvis was performed using the standard protocol following bolus administration of intravenous contrast. CONTRAST:  71m ISOVUE-300 IOPAMIDOL (ISOVUE-300) INJECTION 61% COMPARISON:  CT 06/03/2014 FINDINGS: Lower chest: Coronary artery calcifications versus stents. Dependent lower lobe densities  likely atelectasis. More rounded density in the right lower lobe is likely round atelectasis. Bilateral pleural thickening. Liver: No focal lesion or acute abnormality.  Prominent left lobe. Hepatobiliary: Calcified gallstones within physiologically distended gallbladder. No abnormal biliary dilatation. Pancreas: No ductal dilatation or inflammation. Spleen: Calcified granulomas. Subcentimeter hypodensity is nonspecific, likely small cyst or hemangioma, was present on prior exam. No evidence of traumatic injury or adjacent free fluid. Adrenal glands: No nodule or hemorrhage. Kidneys: Symmetric renal enhancement. No hydronephrosis. Exophytic cyst from the left kidney is unchanged. Additional tiny cortical  hypodensity, too small to accurately characterize. Stomach/Bowel: Stomach is distended with ingested contents. There are no dilated or thickened small bowel loops. Moderate volume of stool throughout the colon without colonic wall thickening. The appendix is not visualized, surgically absent per report. No traumatic injury or mesenteric hematoma. Vascular/Lymphatic: No retroperitoneal fluid or adenopathy. Abdominal aorta is normal in caliber. Mild atherosclerosis without aneurysm. Reproductive: Prominent prostate gland. Bladder: Physiologically distended.  No perivesicular inflammation. Other: No free air, free fluid, or intra-abdominal fluid collection. Fat within both inguinal canals. Musculoskeletal: Partially cannulated screws traverse the right femoral neck. Left hip to be evaluated on dedicated left hip CT, reported separately. Diffuse bony under mineralization. No compression deformity or acute lumbar spine fracture. Multilevel degenerative change. Old fractures of the medial and lateral left ribs scattered soft tissue edema without confluent hematoma of the subcutaneous tissues. IMPRESSION: 1. No acute traumatic abnormality of the abdomen or pelvis. 2. Chronic findings as described. 3. Atherosclerosis of the abdominal aorta. Coronary artery calcifications. Electronically Signed   By: Jeb Levering M.D.   On: 03/10/2016 03:36   Pelvis Portable  03/11/2016  CLINICAL DATA:  Postop left hip EXAM: PORTABLE PELVIS 1-2 VIEWS COMPARISON:  03/10/2016 FINDINGS: Single frontal view of the pelvis submitted. The patient is status post intraoperative repair of left femoral neck fracture. Again noted 3 metallic fixation screws along left femoral neck. There is anatomic alignment. Stable metallic fixation screws in right proximal femur. IMPRESSION: Status post intraoperative repair of left femoral neck fracture. Three metallic fixation screws are noted in proximal left femur. There is anatomic alignment. Electronically  Signed   By: Lahoma Crocker M.D.   On: 03/11/2016 09:50   Ct Hip Left Wo Contrast  03/10/2016  CLINICAL DATA:  Left hip pain after fall. EXAM: CT OF THE LEFT HIP WITHOUT CONTRAST TECHNIQUE: Multidetector CT imaging of the left hip was performed according to the standard protocol. Multiplanar CT image reconstructions were also generated. COMPARISON:  Left hip radiographs earlier this day. FINDINGS: Impaction fracture of the left femoral neck. No significant displacement. Left hip osteoarthritis with acetabular spurring and fragmentation. Pubic rami are intact. No large soft tissue hematoma. Soft tissue edema laterally. Excreted intravenous contrast in the urinary bladder. IMPRESSION: Impaction fracture of the left femoral neck. No significant displacement. Electronically Signed   By: Jeb Levering M.D.   On: 03/10/2016 04:09   Dg Chest Port 1 View  03/12/2016  CLINICAL DATA:  Postop, left hip surgery EXAM: PORTABLE CHEST 1 VIEW COMPARISON:  03/10/2016 FINDINGS: Cardiomegaly again noted. No acute infiltrate or pleural effusion. No pulmonary edema. Calcified right hilar lymph node again noted. Mild basilar atelectasis. IMPRESSION: No active disease.  No significant change. Electronically Signed   By: Lahoma Crocker M.D.   On: 03/12/2016 12:13   Chest Portable 1 View  03/10/2016  CLINICAL DATA:  Preoperative chest radiograph.  Initial encounter. EXAM: PORTABLE CHEST 1 VIEW COMPARISON:  Chest radiograph performed  11/12/2015 FINDINGS: The lungs are well-aerated. Mild bibasilar atelectasis is noted. There is no evidence of pleural effusion or pneumothorax. The cardiomediastinal silhouette is enlarged. No acute osseous abnormalities are seen. IMPRESSION: Mild bibasilar atelectasis noted.  Cardiomegaly seen. Electronically Signed   By: Roanna Raider M.D.   On: 03/10/2016 04:04   Dg Knee Complete 4 Views Left  03/10/2016  CLINICAL DATA:  Unwitnessed fall while ambulating with walker. Fall in living room. Left hip and knee  pain after fall. EXAM: LEFT KNEE - COMPLETE 4+ VIEW COMPARISON:  None. FINDINGS: No fracture or dislocation. The alignment and joint spaces are maintained. 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 No joint effusion. Small quadriceps tendon enthesophyte. Vascular calcifications are seen. IMPRESSION: No fracture or subluxation of the left knee. Electronically Signed   By: Rubye Oaks M.D.   On: 03/10/2016 00:54   Dg Hip Operative Unilat With Pelvis Left  03/11/2016  CLINICAL DATA:  Operative imaging from the ORIF of left subcapital femoral neck fracture. EXAM: OPERATIVE LEFT HIP (WITH PELVIS IF PERFORMED) 4 VIEWS TECHNIQUE: Fluoroscopic spot image(s) were submitted for interpretation post-operatively. COMPARISON:  03/10/2016 FINDINGS: Images show placement of 3 screws crossing the femoral neck into femoral head supporting the subcapital femoral neck fracture. The orthopedic hardware is well seated. There is no new fracture or evidence of an operative complication. IMPRESSION: Portable imaging from the ORIF of a left femoral neck fracture. Electronically Signed   By: Amie Portland M.D.   On: 03/11/2016 09:00   Dg Hip Unilat With Pelvis 2-3 Views Left  03/10/2016  CLINICAL DATA:  Unwitnessed fall while ambulating with walker. Fall in living room. Left hip and knee pain after fall. EXAM: DG HIP (WITH OR WITHOUT PELVIS) 2-3V LEFT COMPARISON:  None. FINDINGS: Linear sclerosis involving the subcapital left femoral neck, suspicious for impacted femoral neck fracture. Femoral head remains seated in the acetabulum. Fragmentation multiple lateral acetabulum is similar to prior abdominal radiograph from 2011 and appears chronic. No evidence pubic rami fracture. Multiple partially cannulated screws traverse the right proximal femur. IMPRESSION: Findings suspicious for impacted left femoral neck fracture. Electronically Signed   By: Rubye Oaks M.D.   On: 03/10/2016 00:52        Filed Weights   03/10/16  0005  Weight: 72.576 kg (160 lb)     Microbiology: Recent Results (from the past 240 hour(s))  MRSA PCR Screening     Status: None   Collection Time: 03/10/16  5:26 AM  Result Value Ref Range Status   MRSA by PCR NEGATIVE NEGATIVE Final    Comment:        The GeneXpert MRSA Assay (FDA approved for NASAL specimens only), is one component of a comprehensive MRSA colonization surveillance program. It is not intended to diagnose MRSA infection nor to guide or monitor treatment for MRSA infections.        Blood Culture    Component Value Date/Time   SDES BLOOD LEFT ANTECUBITAL 11/07/2014 2337   SPECREQUEST  11/07/2014 2337    BOTTLES DRAWN AEROBIC AND ANAEROBIC 10CC BLUE 4CC PURPLE   CULT  11/07/2014 2337    NO GROWTH 5 DAYS Performed at [facility]    REPTSTATUS 11/14/2014 FINAL 11/07/2014 2337      Labs: Results for orders placed or performed during the hospital encounter of 03/09/16 (from the past 48 hour(s))  CBC     Status: Abnormal   Collection Time: 03/12/16  3:01 AM  Result Value Ref Range   WBC  7.5 4.0 - 10.5 K/uL   RBC 2.71 (L) 4.22 - 5.81 MIL/uL   Hemoglobin 10.0 (L) 13.0 - 17.0 g/dL   HCT 30.5 (L) 39.0 - 52.0 %   MCV 112.5 (H) 78.0 - 100.0 fL   MCH 36.9 (H) 26.0 - 34.0 pg   MCHC 32.8 30.0 - 36.0 g/dL   RDW 14.5 11.5 - 15.5 %   Platelets 110 (L) 150 - 400 K/uL    Comment: CONSISTENT WITH PREVIOUS RESULT  Comprehensive metabolic panel     Status: Abnormal   Collection Time: 03/12/16  3:01 AM  Result Value Ref Range   Sodium 137 135 - 145 mmol/L   Potassium 4.7 3.5 - 5.1 mmol/L   Chloride 103 101 - 111 mmol/L   CO2 27 22 - 32 mmol/L   Glucose, Bld 115 (H) 65 - 99 mg/dL   BUN 20 6 - 20 mg/dL   Creatinine, Ser 1.72 (H) 0.61 - 1.24 mg/dL   Calcium 8.7 (L) 8.9 - 10.3 mg/dL   Total Protein 5.2 (L) 6.5 - 8.1 g/dL   Albumin 2.8 (L) 3.5 - 5.0 g/dL   AST 36 15 - 41 U/L   ALT 31 17 - 63 U/L   Alkaline Phosphatase 36 (L) 38 - 126 U/L   Total  Bilirubin 1.3 (H) 0.3 - 1.2 mg/dL   GFR calc non Af Amer 35 (L) >60 mL/min   GFR calc Af Amer 41 (L) >60 mL/min    Comment: (NOTE) The eGFR has been calculated using the CKD EPI equation. This calculation has not been validated in all clinical situations. eGFR's persistently <60 mL/min signify possible Chronic Kidney Disease.    Anion gap 7 5 - 15  Protime-INR     Status: Abnormal   Collection Time: 03/12/16  9:55 AM  Result Value Ref Range   Prothrombin Time 16.1 (H) 11.6 - 15.2 seconds   INR 1.27 0.00 - 1.49  Urinalysis, Routine w reflex microscopic (not at [facility])     Status: Abnormal   Collection Time: 03/12/16  1:29 PM  Result Value Ref Range   Color, Urine YELLOW YELLOW   APPearance CLEAR CLEAR   Specific Gravity, Urine 1.008 1.005 - 1.030   pH 6.5 5.0 - 8.0   Glucose, UA NEGATIVE NEGATIVE mg/dL   Hgb urine dipstick SMALL (A) NEGATIVE   Bilirubin Urine NEGATIVE NEGATIVE   Ketones, ur NEGATIVE NEGATIVE mg/dL   Protein, ur NEGATIVE NEGATIVE mg/dL   Nitrite NEGATIVE NEGATIVE   Leukocytes, UA NEGATIVE NEGATIVE  Urine microscopic-add on     Status: Abnormal   Collection Time: 03/12/16  1:29 PM  Result Value Ref Range   Squamous Epithelial / LPF 0-5 (A) NONE SEEN   WBC, UA 0-5 0 - 5 WBC/hpf   RBC / HPF 0-5 0 - 5 RBC/hpf   Bacteria, UA RARE (A) NONE SEEN  CBC     Status: Abnormal   Collection Time: 03/13/16  3:50 AM  Result Value Ref Range   WBC 5.9 4.0 - 10.5 K/uL   RBC 2.44 (L) 4.22 - 5.81 MIL/uL   Hemoglobin 9.1 (L) 13.0 - 17.0 g/dL   HCT 27.5 (L) 39.0 - 52.0 %   MCV 112.7 (H) 78.0 - 100.0 fL   MCH 37.3 (H) 26.0 - 34.0 pg   MCHC 33.1 30.0 - 36.0 g/dL   RDW 14.5 11.5 - 15.5 %   Platelets 114 (L) 150 - 400 K/uL    Comment: CONSISTENT WITH PREVIOUS  RESULT  Protime-INR     Status: Abnormal   Collection Time: 03/13/16  3:50 AM  Result Value Ref Range   Prothrombin Time 17.3 (H) 11.6 - 15.2 seconds   INR 1.40 0.00 - 1.49     Lipid Panel     Lab Results   Component Value Date   HGBA1C 5.7* 11/13/2015   HGBA1C 5.5 06/02/2013   HGBA1C 5.8 09/06/2012        82 y.o. male with medical history significant of R hip fx, CKD stage 3, prior PE and TIA on coumadin. Patient presents to the ED after a mechanical fall at home. This occurred just PTA, he tripped over his walker. [address] struck head on couch and carpeted floor. Reports pain to left thigh and knee. Unable to bear weight on hip. H/o prior R hip fx and C7 fx from falls. Found to have Valgus impacted left femoral neck fracture  Hospital course Left hip fracture Left hip pinning performed by Dr. Lyla Glassing. Okay to resume Coumadin INR still subtherapeutic, continue Lovenox until INR greater than 2.0 Hemoglobin slowly trending down, follow CBC weekly weightbearing left lower extremity with a walker, as per orthopedic recommendations PT OT -recommended SNF    History of pulmonary embolism Continue Lovenox for DVT prophylaxis, discontinue Lovenox when INR greater than 2 Coumadin resumed, dose adjusted and different from his home dose  Gastroesophageal reflux disease-continue PPI  Depression continue Effexor  Low-grade fever-likely secondary to atelectasis, chest x-ray UA negative     Discharge Exam: * Blood pressure 124/75, pulse 75, temperature 98 F (36.7 C), temperature source Oral, resp. rate 16, height '5\' 6"'$  (1.676 m), weight 72.576 kg (160 lb), SpO2 99 %.    General exam: Appears calm and comfortable  Respiratory system: Clear to auscultation. Respiratory effort normal. Cardiovascular system: S1 & S2 heard, RRR. No JVD, murmurs, rubs, gallops or clicks. No pedal edema. Gastrointestinal system: Abdomen is nondistended, soft and nontender. No organomegaly or masses felt. Normal bowel sounds heard. Central nervous system: Alert and oriented. No focal neurological deficits. Extremities: Symmetric 5 x 5 power. Skin: No rashes, lesions or ulcers Psychiatry: Judgement  and insight appear normal. Mood & affect appropriate.   Follow-up Information    Follow up with Swinteck, Horald Pollen, MD. Schedule an appointment as soon as possible for a visit in 2 weeks.   Specialty:  Orthopedic Surgery   Why:  For wound re-check   Contact information:   [place]. [place] [address] [phone]       Follow up with Ria Bush, MD. Schedule an appointment as soon as possible for a visit in 1 week.   Specialty:  Family Medicine   Contact information:   [place] [address] [phone]       Signed: Reyne Dumas 03/13/2016, 11:02 AM        Time spent >45 mins

## 2016-03-13 NOTE — Care Management Important Message (Signed)
Important Message  Patient Details  Name: [name] MRN: [id number] Date of Birth: 1931-06-11   Medicare Important Message Given:  Yes    Loann Quill 03/13/2016, 9:04 AM

## 2016-03-13 NOTE — Clinical Social Work Placement (Signed)
   CLINICAL SOCIAL WORK PLACEMENT  NOTE  Date:  03/13/2016  Patient Details  Name: [name] MRN: [id number] Date of Birth: June 19, 1931  Clinical Social Work is seeking post-discharge placement for this patient at the [place] level of care (*CSW will initial, date and re-position this form in  chart as items are completed):  Yes   Patient/family provided with [facility] Work Department's list of facilities offering this level of care within the geographic area requested by the patient (or if unable, by the patient's family).  Yes   Patient/family informed of their freedom to choose among providers that offer the needed level of care, that participate in Medicare, Medicaid or managed care program needed by the patient, have an available bed and are willing to accept the patient.  Yes   Patient/family informed of [facility]'s ownership interest in [facility] and [facility], as well as of the fact that they are under no obligation to receive care at these facilities.  PASRR submitted to EDS on       PASRR number received on       Existing PASRR number confirmed on 03/12/16     FL2 transmitted to all facilities in geographic area requested by pt/family on 03/12/16     FL2 transmitted to all facilities within larger geographic area on       Patient informed that his/her managed care company has contracts with or will negotiate with certain facilities, including the following:        Yes   Patient/family informed of bed offers received.  Patient chooses bed at [facility]     Physician recommends and patient chooses bed at      Patient to be transferred to [facility] on 03/13/16.  Patient to be transferred to facility by [facility]     Patient family notified on 03/13/16 of transfer.  Name of family member notified:  Wife     PHYSICIAN       Additional Comment:     _______________________________________________ Caroline Sauger, LCSW 03/13/2016, 2:36 PM

## 2016-03-14 ENCOUNTER — Ambulatory Visit: Payer: Medicare Other | Admitting: Hematology

## 2016-03-14 ENCOUNTER — Telehealth: Payer: Self-pay | Admitting: *Deleted

## 2016-03-14 ENCOUNTER — Other Ambulatory Visit: Payer: Medicare Other

## 2016-03-14 ENCOUNTER — Other Ambulatory Visit
Admission: RE | Admit: 2016-03-14 | Discharge: 2016-03-14 | Disposition: A | Discharge status: Home / Self Care | Payer: Medicare Other | Source: Ambulatory Visit | Attending: Family Medicine | Admitting: Family Medicine

## 2016-03-14 DIAGNOSIS — D509 Iron deficiency anemia, unspecified: Secondary | ICD-10-CM | POA: Insufficient documentation

## 2016-03-14 LAB — PROTIME-INR
INR: 1.54
Prothrombin Time: 18.5 s — ABNORMAL HIGH (ref 11.4–15.0)

## 2016-03-14 NOTE — Telephone Encounter (Signed)
Noted. Thanks.

## 2016-03-14 NOTE — Telephone Encounter (Signed)
Patient's wife called and wanted to let you know that he has been moved to rehab and is doing good so far. They are still bridging his lovenox. She will keep you updated. I cancelled his follow up for 03/17/16.

## 2016-03-17 ENCOUNTER — Ambulatory Visit: Payer: Medicare Other | Admitting: Family Medicine

## 2016-03-18 DIAGNOSIS — Z86711 Personal history of pulmonary embolism: Secondary | ICD-10-CM | POA: Diagnosis not present

## 2016-03-18 DIAGNOSIS — K5900 Constipation, unspecified: Secondary | ICD-10-CM | POA: Diagnosis not present

## 2016-03-18 DIAGNOSIS — K219 Gastro-esophageal reflux disease without esophagitis: Secondary | ICD-10-CM | POA: Diagnosis not present

## 2016-03-18 DIAGNOSIS — F329 Major depressive disorder, single episode, unspecified: Secondary | ICD-10-CM | POA: Diagnosis not present

## 2016-03-18 DIAGNOSIS — S72002A Fracture of unspecified part of neck of left femur, initial encounter for closed fracture: Secondary | ICD-10-CM | POA: Diagnosis not present

## 2016-03-18 DIAGNOSIS — E784 Other hyperlipidemia: Secondary | ICD-10-CM | POA: Diagnosis not present

## 2016-03-19 ENCOUNTER — Other Ambulatory Visit: Payer: Medicare Other

## 2016-03-19 ENCOUNTER — Ambulatory Visit: Payer: Medicare Other | Admitting: Hematology

## 2016-03-24 DIAGNOSIS — M25559 Pain in unspecified hip: Secondary | ICD-10-CM | POA: Diagnosis not present

## 2016-03-24 DIAGNOSIS — K5900 Constipation, unspecified: Secondary | ICD-10-CM | POA: Diagnosis not present

## 2016-03-24 DIAGNOSIS — E039 Hypothyroidism, unspecified: Secondary | ICD-10-CM | POA: Diagnosis not present

## 2016-03-24 DIAGNOSIS — K219 Gastro-esophageal reflux disease without esophagitis: Secondary | ICD-10-CM | POA: Diagnosis not present

## 2016-03-24 DIAGNOSIS — D649 Anemia, unspecified: Secondary | ICD-10-CM | POA: Diagnosis not present

## 2016-03-24 DIAGNOSIS — F329 Major depressive disorder, single episode, unspecified: Secondary | ICD-10-CM | POA: Diagnosis not present

## 2016-03-26 ENCOUNTER — Telehealth: Payer: Self-pay | Admitting: Hematology

## 2016-03-26 ENCOUNTER — Other Ambulatory Visit: Payer: Self-pay | Admitting: *Deleted

## 2016-03-26 DIAGNOSIS — S72002D Fracture of unspecified part of neck of left femur, subsequent encounter for closed fracture with routine healing: Secondary | ICD-10-CM | POA: Diagnosis not present

## 2016-03-26 DIAGNOSIS — Z4789 Encounter for other orthopedic aftercare: Secondary | ICD-10-CM | POA: Diagnosis not present

## 2016-03-26 NOTE — Telephone Encounter (Signed)
lvm for [name] at Pt facility to inform them of 7/20 appt at 10 am per 7/19 pof

## 2016-03-27 ENCOUNTER — Ambulatory Visit: Payer: Medicare Other | Admitting: Hematology

## 2016-03-27 ENCOUNTER — Other Ambulatory Visit: Payer: Medicare Other

## 2016-04-02 DIAGNOSIS — E784 Other hyperlipidemia: Secondary | ICD-10-CM | POA: Diagnosis not present

## 2016-04-02 DIAGNOSIS — I2609 Other pulmonary embolism with acute cor pulmonale: Secondary | ICD-10-CM | POA: Diagnosis not present

## 2016-04-02 DIAGNOSIS — S72009A Fracture of unspecified part of neck of unspecified femur, initial encounter for closed fracture: Secondary | ICD-10-CM | POA: Diagnosis not present

## 2016-04-02 DIAGNOSIS — K5900 Constipation, unspecified: Secondary | ICD-10-CM | POA: Diagnosis not present

## 2016-04-02 DIAGNOSIS — N3941 Urge incontinence: Secondary | ICD-10-CM | POA: Diagnosis not present

## 2016-04-02 DIAGNOSIS — F329 Major depressive disorder, single episode, unspecified: Secondary | ICD-10-CM | POA: Diagnosis not present

## 2016-04-02 DIAGNOSIS — K219 Gastro-esophageal reflux disease without esophagitis: Secondary | ICD-10-CM | POA: Diagnosis not present

## 2016-04-06 IMAGING — CR DG CHEST 2V
2 series · 2 of 2 positions shown · non-contrast
Comparison: 03/21/2013

CLINICAL DATA: Fever, UTI

EXAM:
CHEST  2 VIEW

[w chest pa]
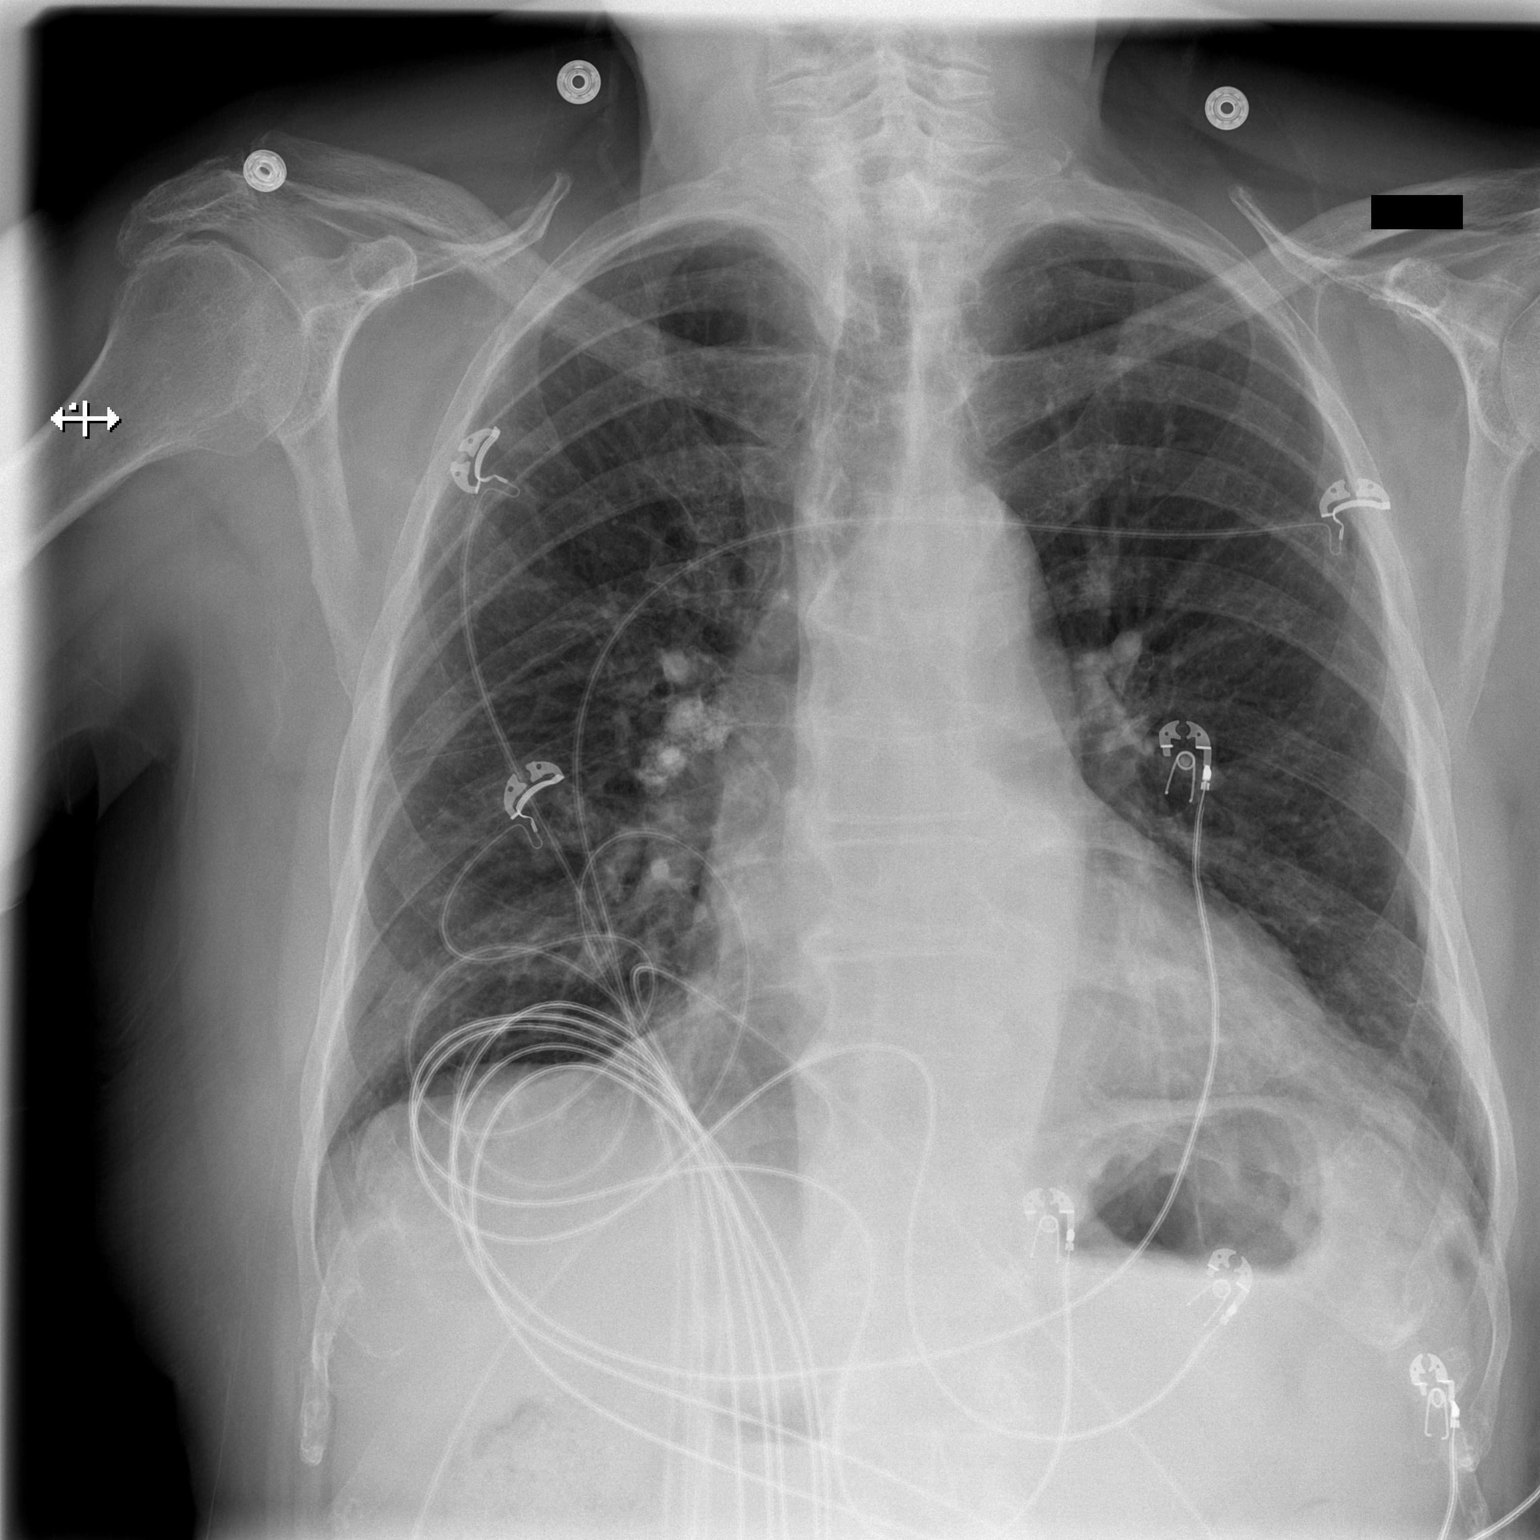

[w chest lat]
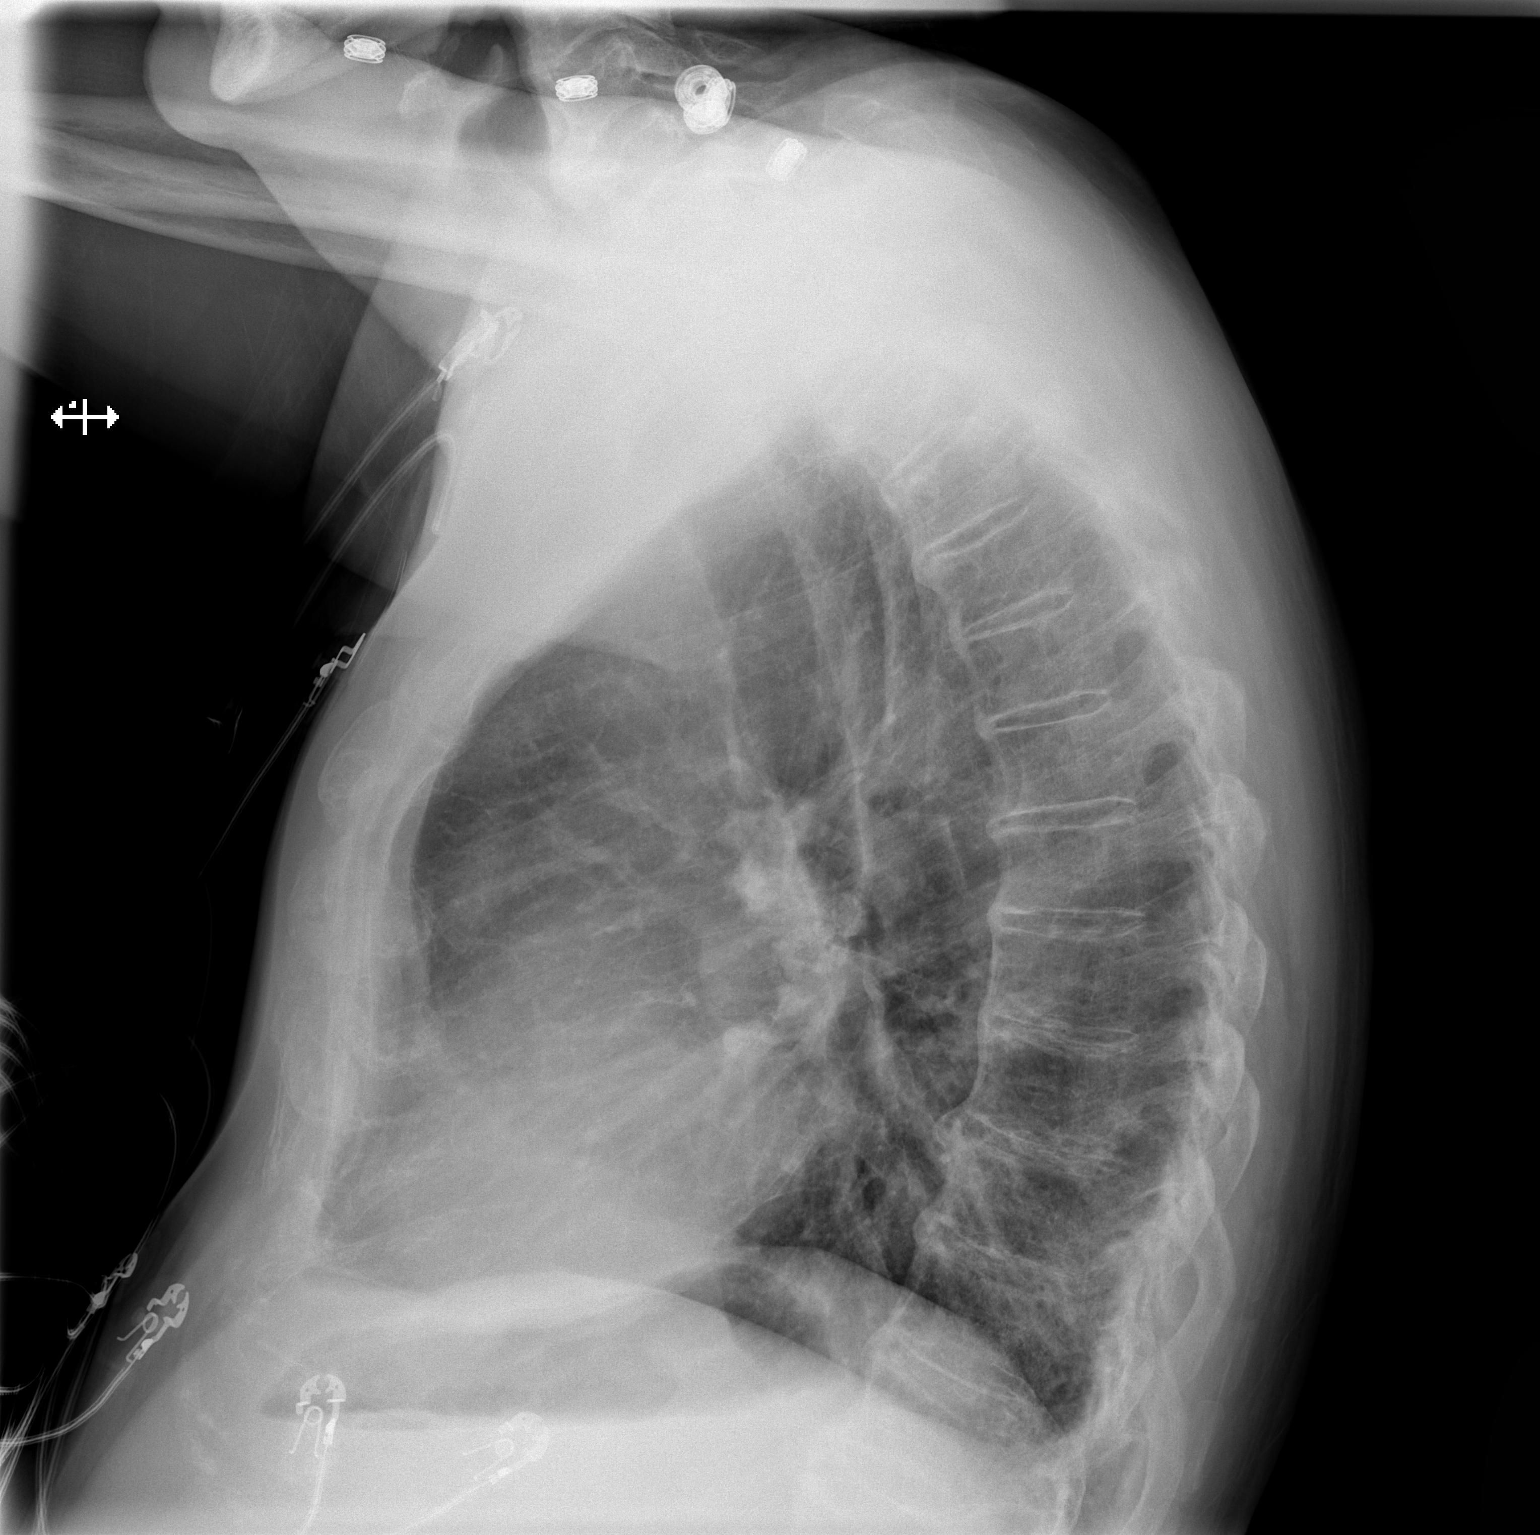

[2 of 2 positions shown; findings below may reference images not displayed]

FINDINGS: Lungs are essentially clear. No focal consolidation. No pleural
effusion or pneumothorax.

The heart is normal in size.  Calcified right perihilar nodes.

Degenerative changes of the visualized thoracolumbar spine.
IMPRESSION: No evidence of acute cardiopulmonary disease.

## 2016-04-08 DIAGNOSIS — K219 Gastro-esophageal reflux disease without esophagitis: Secondary | ICD-10-CM | POA: Diagnosis not present

## 2016-04-08 DIAGNOSIS — S72001A Fracture of unspecified part of neck of right femur, initial encounter for closed fracture: Secondary | ICD-10-CM | POA: Diagnosis not present

## 2016-04-08 DIAGNOSIS — E785 Hyperlipidemia, unspecified: Secondary | ICD-10-CM | POA: Diagnosis not present

## 2016-04-08 DIAGNOSIS — I10 Essential (primary) hypertension: Secondary | ICD-10-CM | POA: Diagnosis not present

## 2016-04-08 DIAGNOSIS — K210 Gastro-esophageal reflux disease with esophagitis: Secondary | ICD-10-CM | POA: Diagnosis not present

## 2016-04-08 DIAGNOSIS — N189 Chronic kidney disease, unspecified: Secondary | ICD-10-CM | POA: Diagnosis not present

## 2016-04-08 DIAGNOSIS — R41841 Cognitive communication deficit: Secondary | ICD-10-CM | POA: Diagnosis not present

## 2016-04-08 DIAGNOSIS — D539 Nutritional anemia, unspecified: Secondary | ICD-10-CM | POA: Diagnosis present

## 2016-04-08 DIAGNOSIS — E784 Other hyperlipidemia: Secondary | ICD-10-CM | POA: Diagnosis not present

## 2016-04-08 DIAGNOSIS — K5900 Constipation, unspecified: Secondary | ICD-10-CM | POA: Diagnosis not present

## 2016-04-08 DIAGNOSIS — Z5189 Encounter for other specified aftercare: Secondary | ICD-10-CM | POA: Diagnosis not present

## 2016-04-08 DIAGNOSIS — D696 Thrombocytopenia, unspecified: Secondary | ICD-10-CM | POA: Diagnosis not present

## 2016-04-08 DIAGNOSIS — F329 Major depressive disorder, single episode, unspecified: Secondary | ICD-10-CM | POA: Diagnosis not present

## 2016-04-08 DIAGNOSIS — M6281 Muscle weakness (generalized): Secondary | ICD-10-CM | POA: Diagnosis not present

## 2016-04-08 DIAGNOSIS — S72009A Fracture of unspecified part of neck of unspecified femur, initial encounter for closed fracture: Secondary | ICD-10-CM | POA: Diagnosis not present

## 2016-04-08 DIAGNOSIS — I639 Cerebral infarction, unspecified: Secondary | ICD-10-CM | POA: Diagnosis not present

## 2016-04-09 DIAGNOSIS — K219 Gastro-esophageal reflux disease without esophagitis: Secondary | ICD-10-CM | POA: Diagnosis not present

## 2016-04-09 DIAGNOSIS — S72009A Fracture of unspecified part of neck of unspecified femur, initial encounter for closed fracture: Secondary | ICD-10-CM | POA: Diagnosis not present

## 2016-04-09 DIAGNOSIS — K5900 Constipation, unspecified: Secondary | ICD-10-CM | POA: Diagnosis not present

## 2016-04-09 DIAGNOSIS — E784 Other hyperlipidemia: Secondary | ICD-10-CM | POA: Diagnosis not present

## 2016-04-09 DIAGNOSIS — F329 Major depressive disorder, single episode, unspecified: Secondary | ICD-10-CM | POA: Diagnosis not present

## 2016-04-10 IMAGING — CR DG CHEST 2V
2 series · 2 of 2 positions shown · non-contrast
Comparison: 05/29/2014 and 03/21/2013.

CLINICAL DATA: Cough and shortness of breath.

EXAM:
CHEST  2 VIEW

[w chest lat]
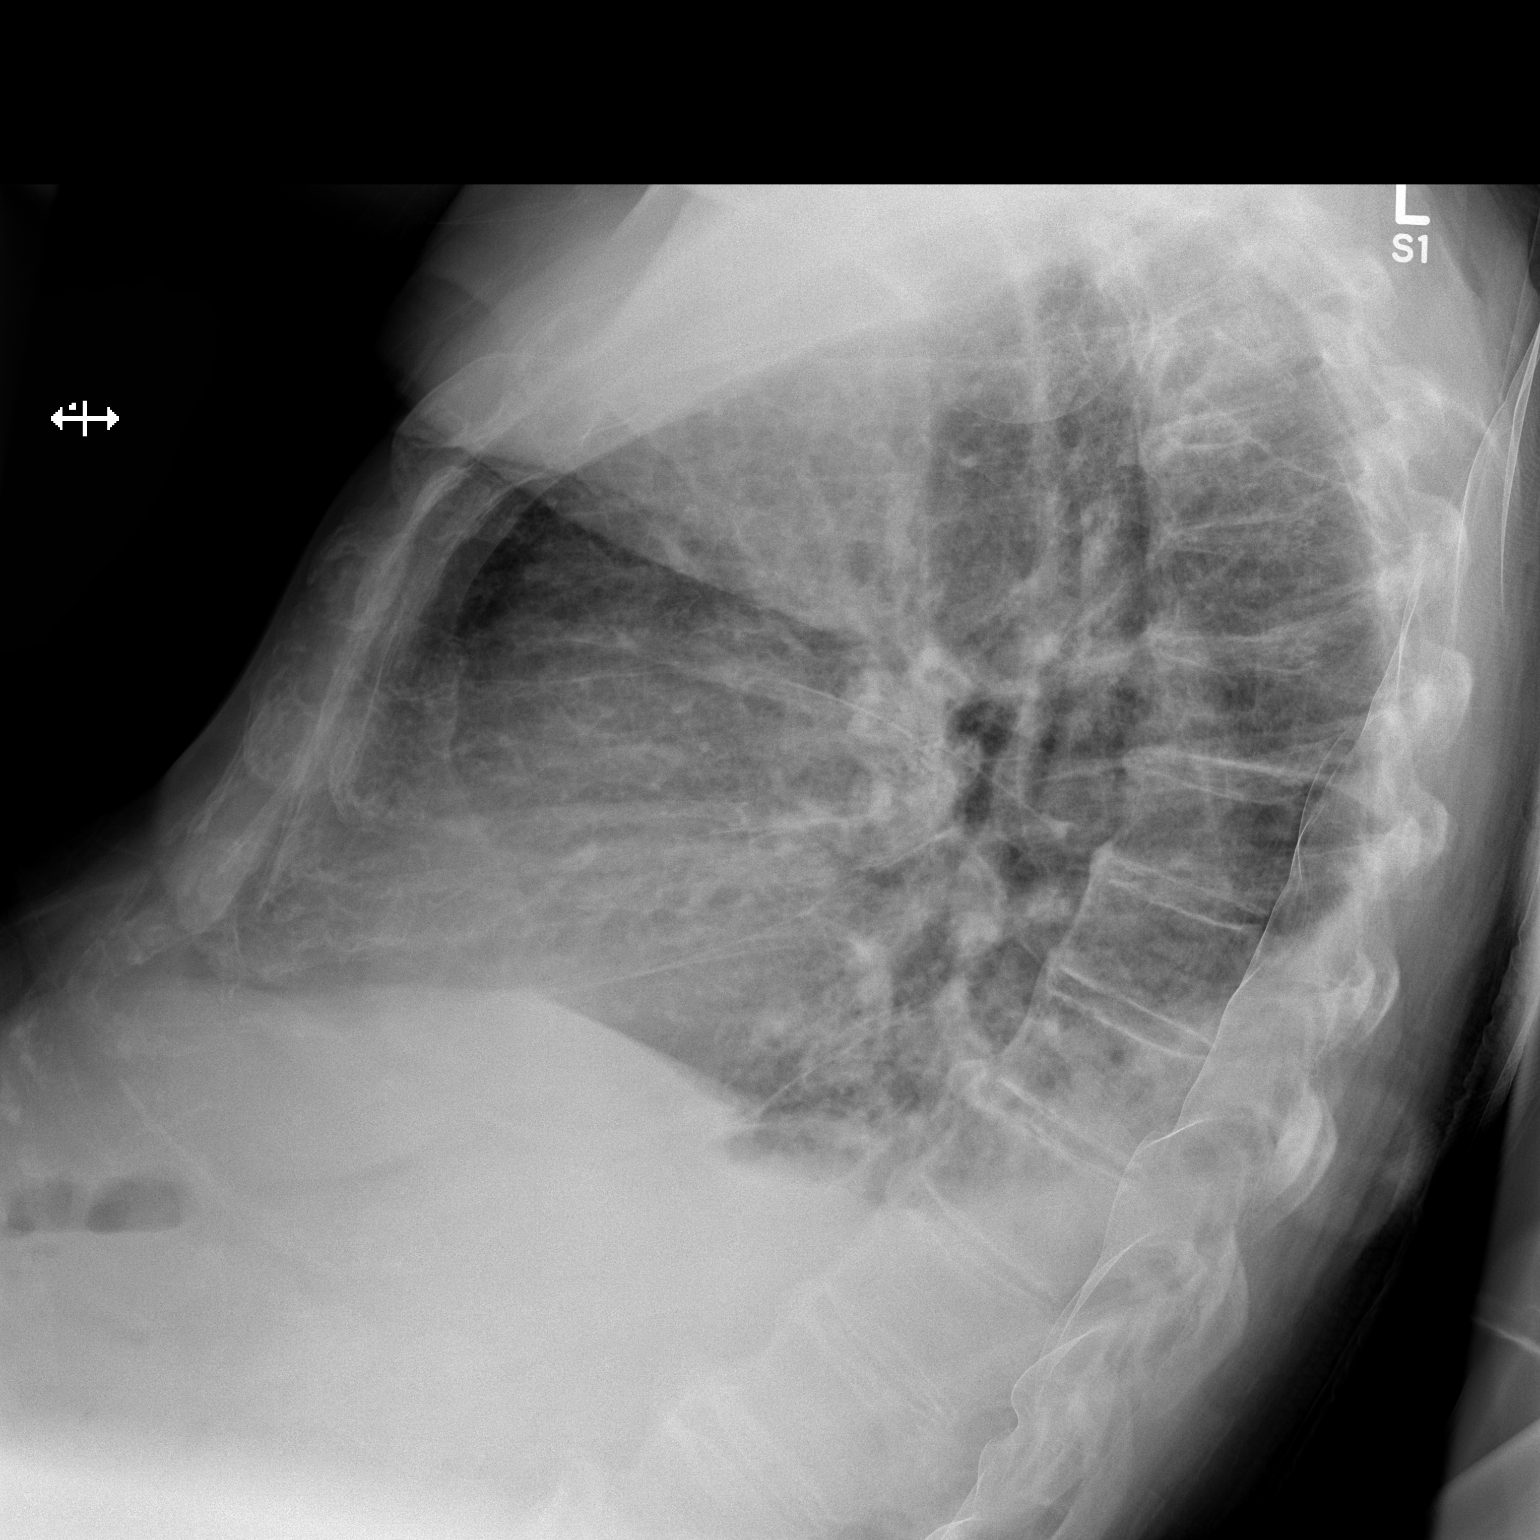

[x chest ap]
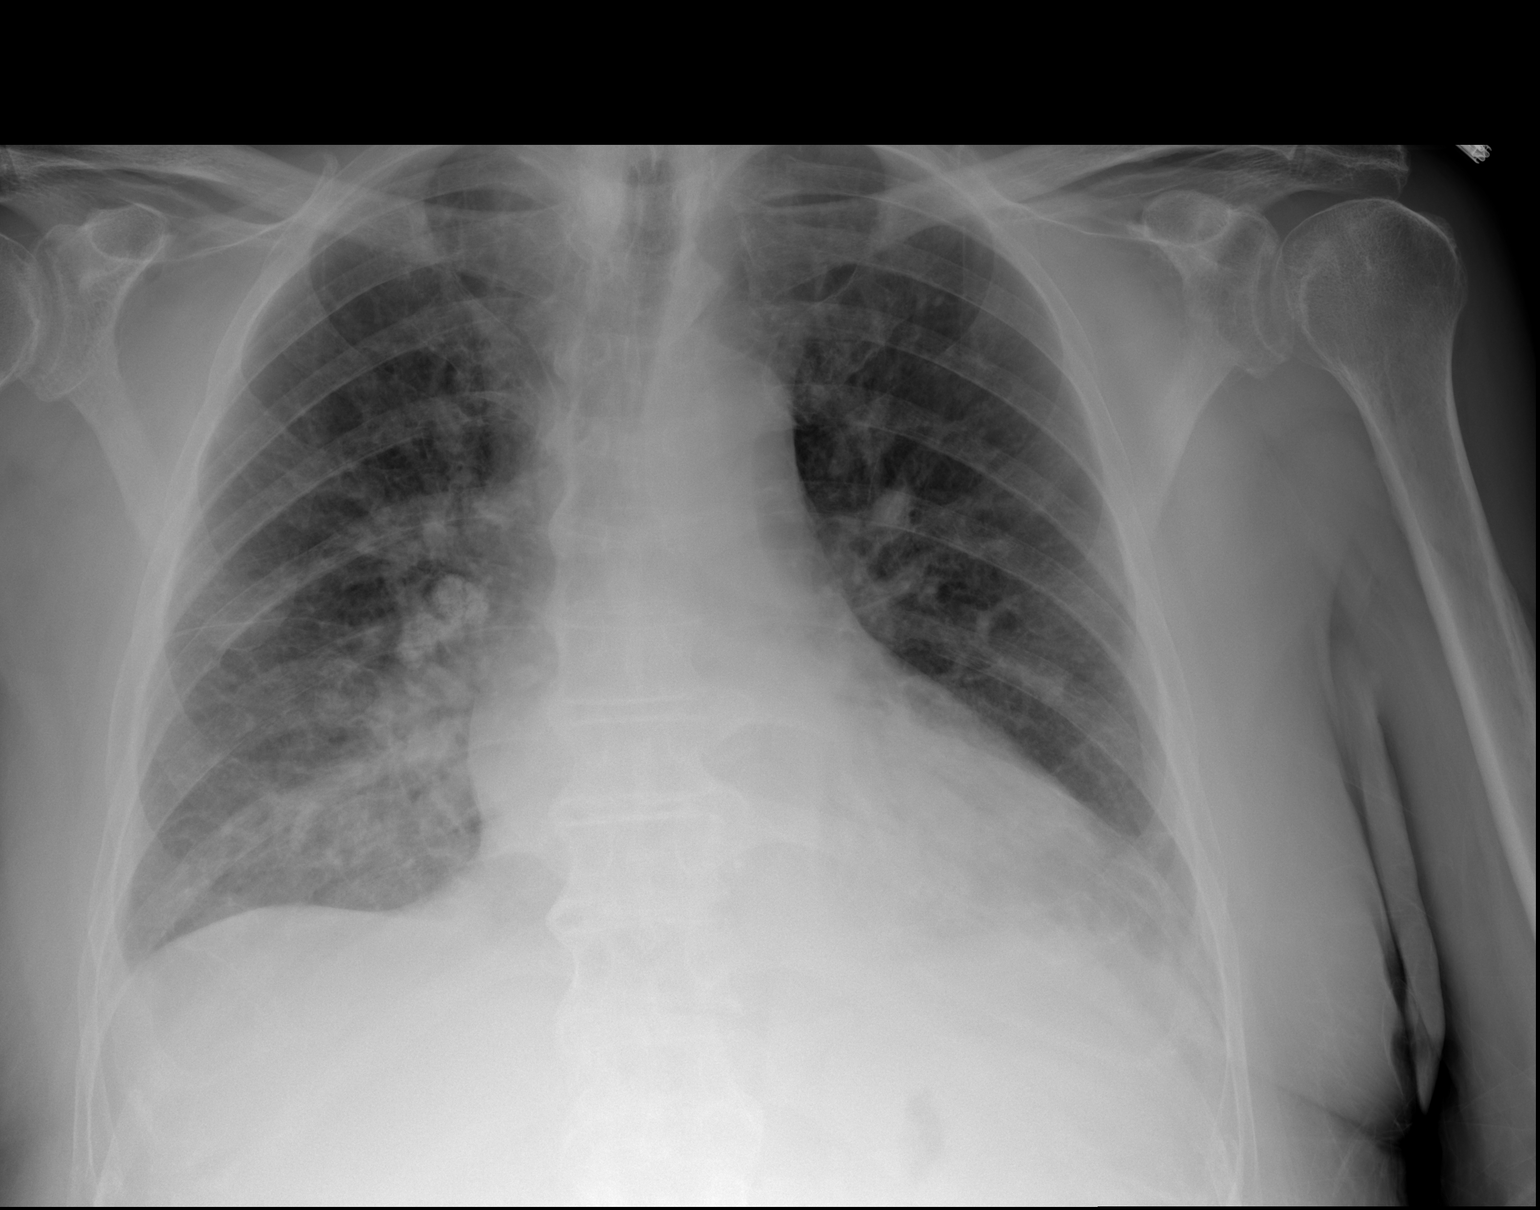

[2 of 2 positions shown; findings below may reference images not displayed]

FINDINGS: Trachea is midline. Heart size stable. There is mixed interstitial
and airspace disease, basilar dependent, new from 05/29/2014. Small
bilateral effusions. Flowing anterior osteophytosis in the thoracic
spine.
IMPRESSION: New mixed interstitial and airspace disease, basilar dependent, with
bilateral effusions. Findings may be due to congestive heart
failure. Pneumonia not excluded.

## 2016-04-16 ENCOUNTER — Ambulatory Visit (HOSPITAL_BASED_OUTPATIENT_CLINIC_OR_DEPARTMENT_OTHER): Payer: Medicare Other | Admitting: Hematology

## 2016-04-16 ENCOUNTER — Other Ambulatory Visit (HOSPITAL_BASED_OUTPATIENT_CLINIC_OR_DEPARTMENT_OTHER): Payer: Medicare Other

## 2016-04-16 ENCOUNTER — Encounter: Payer: Self-pay | Admitting: Hematology

## 2016-04-16 VITALS — BP 117/49 | HR 82 | Temp 98.3°F | Resp 18 | Ht 66.0 in | Wt 161.0 lb

## 2016-04-16 DIAGNOSIS — D539 Nutritional anemia, unspecified: Secondary | ICD-10-CM

## 2016-04-16 DIAGNOSIS — D696 Thrombocytopenia, unspecified: Secondary | ICD-10-CM

## 2016-04-16 DIAGNOSIS — N189 Chronic kidney disease, unspecified: Secondary | ICD-10-CM

## 2016-04-16 DIAGNOSIS — I639 Cerebral infarction, unspecified: Secondary | ICD-10-CM

## 2016-04-16 LAB — COMPREHENSIVE METABOLIC PANEL
ALBUMIN: 3.6 g/dL (ref 3.5–5.0)
ALK PHOS: 75 U/L (ref 40–150)
ALT: 37 U/L (ref 0–55)
ANION GAP: 9 meq/L (ref 3–11)
AST: 38 U/L — ABNORMAL HIGH (ref 5–34)
BUN: 33.6 mg/dL — ABNORMAL HIGH (ref 7.0–26.0)
CALCIUM: 9.8 mg/dL (ref 8.4–10.4)
CO2: 27 mEq/L (ref 22–29)
CREATININE: 1.5 mg/dL — AB (ref 0.7–1.3)
Chloride: 104 mEq/L (ref 98–109)
EGFR: 43 mL/min/{1.73_m2} — ABNORMAL LOW (ref >90)
Glucose: 94 mg/dl (ref 70–140)
Potassium: 3.9 mEq/L (ref 3.5–5.1)
Sodium: 139 mEq/L (ref 136–145)
TOTAL PROTEIN: 7.1 g/dL (ref 6.4–8.3)
Total Bilirubin: 1.1 mg/dL (ref 0.20–1.20)

## 2016-04-16 LAB — CBC WITH DIFFERENTIAL/PLATELET
BASO%: 0.8 % (ref 0.0–2.0)
Basophils Absolute: 0 10*3/uL (ref 0.0–0.1)
EOS%: 1.2 % (ref 0.0–7.0)
Eosinophils Absolute: 0.1 10*3/uL (ref 0.0–0.5)
HEMATOCRIT: 32.8 % — AB (ref 38.4–49.9)
HEMOGLOBIN: 11 g/dL — AB (ref 13.0–17.1)
LYMPH#: 1 10*3/uL (ref 0.9–3.3)
LYMPH%: 16.9 % (ref 14.0–49.0)
MCH: 37 pg — ABNORMAL HIGH (ref 27.2–33.4)
MCHC: 33.5 g/dL (ref 32.0–36.0)
MCV: 110.4 fL — ABNORMAL HIGH (ref 79.3–98.0)
MONO#: 0.4 10*3/uL (ref 0.1–0.9)
MONO%: 6 % (ref 0.0–14.0)
NEUT%: 75.1 % — AB (ref 39.0–75.0)
NEUTROS ABS: 4.5 10*3/uL (ref 1.5–6.5)
PLATELETS: 227 10*3/uL (ref 140–400)
RBC: 2.97 10*6/uL — ABNORMAL LOW (ref 4.20–5.82)
RDW: 15.5 % — ABNORMAL HIGH (ref 11.0–14.6)
WBC: 5.9 10*3/uL (ref 4.0–10.3)

## 2016-04-16 LAB — FERRITIN: FERRITIN: 403 ng/mL — AB (ref 22–316)

## 2016-04-16 LAB — TECHNOLOGIST REVIEW

## 2016-04-18 ENCOUNTER — Telehealth: Payer: Self-pay | Admitting: Hematology

## 2016-04-18 ENCOUNTER — Encounter: Payer: Self-pay | Admitting: Family Medicine

## 2016-04-18 ENCOUNTER — Ambulatory Visit (INDEPENDENT_AMBULATORY_CARE_PROVIDER_SITE_OTHER): Payer: Medicare Other | Admitting: Family Medicine

## 2016-04-18 VITALS — BP 112/64 | HR 80 | Temp 98.0°F

## 2016-04-18 DIAGNOSIS — N183 Chronic kidney disease, stage 3 unspecified: Secondary | ICD-10-CM

## 2016-04-18 DIAGNOSIS — F331 Major depressive disorder, recurrent, moderate: Secondary | ICD-10-CM

## 2016-04-18 DIAGNOSIS — S72002D Fracture of unspecified part of neck of left femur, subsequent encounter for closed fracture with routine healing: Secondary | ICD-10-CM

## 2016-04-18 DIAGNOSIS — R791 Abnormal coagulation profile: Secondary | ICD-10-CM | POA: Diagnosis not present

## 2016-04-18 DIAGNOSIS — N3941 Urge incontinence: Secondary | ICD-10-CM

## 2016-04-18 DIAGNOSIS — N3000 Acute cystitis without hematuria: Secondary | ICD-10-CM

## 2016-04-18 DIAGNOSIS — Z7901 Long term (current) use of anticoagulants: Secondary | ICD-10-CM | POA: Diagnosis not present

## 2016-04-18 DIAGNOSIS — I639 Cerebral infarction, unspecified: Secondary | ICD-10-CM

## 2016-04-18 LAB — POCT INR: INR: 1.3

## 2016-04-18 NOTE — Progress Notes (Signed)
BP 112/64   Pulse 80   Temp 98 F (36.7 C) (Oral) weight not performed as patient in wheelchair   CC: hospital/rehab f/u visit Subjective:    Patient ID: [name], male    DOB: 05-25-1931, 82 y.o.   MRN: [id number]  HPI: [name] is a 82 y.o. male presenting on 04/18/2016 for Follow-up (Hospital/Rehab)   Recent complicated hospitalization for left femoral neck fracture with displaced femoral neck. Fall 7/2, surgery 7/4, rehab 7/6. Rehab at [facility] in [place] home yesterday 810/2017.  [facility].   Discussing anticoagulant options with Dr Irene Limbo and Dr Leonie Man. Would like to further discuss this today as well.   Saw Dr Jeffie Pollock 04/02/2016. Started on myrbetriq. Upcoming f/u appt with Dr Jeffie Pollock 8/28. Started on cipro 500mg  10d course BID 04/13/2016 for UTI at rehab. UCx not available - will request records today.   BRBPR 04/03/2016 in rehab. INR 5.5 at that time --> 3 (04/04/2016).   Current coumadin dose is 1.25mg  while on cipro. Prior dose was 5mg  MWF, 2.5mg  other days.   Relevant past medical, surgical, family and social history reviewed and updated as indicated. Interim medical history since our last visit reviewed. Allergies and medications reviewed and updated. Current Outpatient Prescriptions on File Prior to Visit  Medication Sig  . albuterol (PROVENTIL HFA;VENTOLIN HFA) 108 (90 Base) MCG/ACT inhaler Inhale 2 puffs into the lungs every 6 (six) hours as needed for wheezing or shortness of breath.  Marland Kitchen b complex vitamins tablet Take 1 tablet by mouth daily.  . Biotin 2500 MCG CAPS Take 1 capsule by mouth daily.  . Cholecalciferol (VITAMIN D) 2000 UNITS CAPS Take 1 capsule by mouth daily. Reported on 01/30/2016  . clotrimazole (LOTRIMIN) 1 % cream Apply 1 application topically 2 (two) times daily. (Patient taking differently: Apply 1 application topically 2 (two) times daily as needed. For rash)  . Coenzyme Q10 (COQ-10) 100 MG CAPS Take 1  capsule by mouth daily.   . Cyanocobalamin (B-12 PO) Take 10,000 mcg by mouth every Monday, Wednesday, and Friday.   Marland Kitchen dexlansoprazole (DEXILANT) 60 MG capsule Take 1 capsule (60 mg total) by mouth daily.  Marland Kitchen docusate sodium (COLACE) 100 MG capsule Take 100 mg by mouth at bedtime.   . fenofibrate (TRICOR) 145 MG tablet Take 1 tablet (145 mg total) by mouth daily.  . fexofenadine (ALLEGRA) 180 MG tablet Take 180 mg by mouth daily.   Marland Kitchen gabapentin (NEURONTIN) 100 MG capsule Take 2 capsules (200 mg total) by mouth 3 (three) times daily. (Patient taking differently: Take 200-300 mg by mouth 2 (two) times daily. Take 3 capsules in the morning Take 2 capsules in the evening)  . Multiple Vitamins-Minerals (MULTIVITAMIN PO) Take 1 tablet by mouth daily.   . simvastatin (ZOCOR) 40 MG tablet Take 1 tablet (40 mg total) by mouth daily.  . sorbitol 70 % SOLN Take 30 mLs by mouth daily as needed for moderate constipation.  Marland Kitchen venlafaxine (EFFEXOR) 75 MG tablet Take 1 tablet (75 mg total) by mouth 2 (two) times daily with a meal.  . warfarin (COUMADIN) 5 MG tablet 5 mg Monday Wednesday Friday, 2.5 mg on other days   No current facility-administered medications on file prior to visit.     Review of Systems Per HPI unless specifically indicated in ROS section     Objective:    BP 112/64   Pulse 80   Temp 98 F (36.7 C) (  Oral)   Wt Readings from Last 3 Encounters:  04/16/16 161 lb (73 kg)  03/10/16 160 lb (72.6 kg)  02/06/16 177 lb 3.2 oz (80.4 kg)    Physical Exam  Constitutional: He appears well-developed and well-nourished. No distress.  Sitting in W/C  HENT:  Mouth/Throat: Oropharynx is clear and moist. No oropharyngeal exudate.  Cardiovascular: Normal rate, regular rhythm, normal heart sounds and intact distal pulses.   No murmur heard. Pulmonary/Chest: Effort normal and breath sounds normal. No respiratory distress. He has no wheezes. He has no rales.  Musculoskeletal: He exhibits no edema.   Skin: Skin is warm. No rash noted.  Psychiatric: He has a normal mood and affect.  Nursing note and vitals reviewed.  Lab Results  Component Value Date   WBC 5.9 04/16/2016   HGB 11.0 (L) 04/16/2016   HCT 32.8 (L) 04/16/2016   MCV 110.4 (H) 04/16/2016   PLT 227 04/16/2016   Lab Results  Component Value Date   CREATININE 1.5 (H) 04/16/2016    Results for orders placed or performed in visit on 04/18/16  POCT INR  Result Value Ref Range   INR 1.3       Assessment & Plan:   Problem List Items Addressed This Visit    Acute ischemic stroke ([place])    Back on coumadin since stroke 11/2015. See below for anticoagulant management.       CKD (chronic kidney disease) stage 3, GFR 30-59 ml/min   Elevated factor VIII level    See [facility] management discussion.       Encounter for current long-term use of anticoagulants    Check INR today 1.3. Will increase coumadin to 2.5mg  daily while on cipro. Needs to complete full 6 wks coumadin post hip surgery.  Coumadin was stopped for ~6 months late 2016, then suffered ischemic stroke, was found to have elevated factor VIII levels making him more prone to recurrent thrombosis. Now has suffered fall with resultant left hip fracture.  Difficult situation as he's high risk for bleeding complication with increased fall risk but is also high risk for recurrent thrombosis.  Has discussed with Dr Leonie Man and Dr Irene Limbo - considering full dose aspirin in place of stronger anticoagulant. I would tend to agree with this recommendation.  Will make final decision on anticoagulant at 1 mo f/u visit. Pt and wife agree with plan.      Left displaced femoral neck fracture ([place]) - Primary    Anticoagulated CVA 12/2015, now s/p fall with femoral neck fracture s/p hip pinning surgery. See below for [facility] management discussion.  Completed rehab, now at home with HHPT involved. Wife disabled after R tibial plateau fracture making patient care more difficult.       MDD  (major depressive disorder), recurrent episode, moderate (HCC)    Stable on effexor 75mg  bid.      Urge incontinence    Oxybutynin stopped after recent fall. myrbetriq started by Dr Margarito Liner, f/u appt scheduled 8/26.       Relevant Medications   mirabegron ER (MYRBETRIQ) 25 MG TB24 tablet   UTI (urinary tract infection)    Recent dx UTI at rehab (UCx results requested), started on cipro 500mg  bid (04/13/2016). Given CKD with GFR 43, will decrease cipro dose to 250mg  bid to complete 10d course (04/23/2016). Check INR today. See below for coumadin dosing.        Other Visit Diagnoses   None.      Follow up plan: Return  in about 4 weeks (around 05/16/2016) for follow up visit.  Ria Bush, MD

## 2016-04-18 NOTE — Telephone Encounter (Signed)
per los to sch on as needed basis-no appts sch

## 2016-04-18 NOTE — Progress Notes (Signed)
Pre visit review using our clinic review tool, if applicable. No additional management support is needed unless otherwise documented below in the visit note. 

## 2016-04-18 NOTE — Patient Instructions (Addendum)
Sign release for latest labs from [facility] as well as latest INR readings.  I think best option may be full dose aspirin 325mg  after completing 6 wks of coumadin therapy post surgery.  Good to see you today, call us with questions. Return in 1 month for follow up visit. INR check today

## 2016-04-19 DIAGNOSIS — Z7901 Long term (current) use of anticoagulants: Secondary | ICD-10-CM | POA: Diagnosis not present

## 2016-04-19 DIAGNOSIS — Z9181 History of falling: Secondary | ICD-10-CM | POA: Diagnosis not present

## 2016-04-19 DIAGNOSIS — Z86711 Personal history of pulmonary embolism: Secondary | ICD-10-CM | POA: Diagnosis not present

## 2016-04-19 DIAGNOSIS — Z8673 Personal history of transient ischemic attack (TIA), and cerebral infarction without residual deficits: Secondary | ICD-10-CM | POA: Diagnosis not present

## 2016-04-19 DIAGNOSIS — Z8781 Personal history of (healed) traumatic fracture: Secondary | ICD-10-CM | POA: Diagnosis not present

## 2016-04-19 DIAGNOSIS — I129 Hypertensive chronic kidney disease with stage 1 through stage 4 chronic kidney disease, or unspecified chronic kidney disease: Secondary | ICD-10-CM | POA: Diagnosis not present

## 2016-04-19 DIAGNOSIS — N183 Chronic kidney disease, stage 3 (moderate): Secondary | ICD-10-CM | POA: Diagnosis not present

## 2016-04-19 DIAGNOSIS — S72002D Fracture of unspecified part of neck of left femur, subsequent encounter for closed fracture with routine healing: Secondary | ICD-10-CM | POA: Diagnosis not present

## 2016-04-20 ENCOUNTER — Encounter: Payer: Self-pay | Admitting: Family Medicine

## 2016-04-20 DIAGNOSIS — Z7189 Other specified counseling: Secondary | ICD-10-CM | POA: Insufficient documentation

## 2016-04-20 NOTE — Assessment & Plan Note (Addendum)
Check INR today 1.3. Will increase coumadin to 2.5mg  daily while on cipro. Needs to complete full 6 wks coumadin post hip surgery.  Coumadin was stopped for ~6 months late 2016, then suffered ischemic stroke, was found to have elevated factor VIII levels making him more prone to recurrent thrombosis. Now has suffered fall with resultant left hip fracture.  Difficult situation as he's high risk for bleeding complication with increased fall risk but is also high risk for recurrent thrombosis.  Has discussed with Dr Leonie Man and Dr Irene Limbo - considering full dose aspirin in place of stronger anticoagulant. I would tend to agree with this recommendation.  Will make final decision on anticoagulant at 1 mo f/u visit. Pt and wife agree with plan.

## 2016-04-20 NOTE — Assessment & Plan Note (Signed)
Back on coumadin since stroke 11/2015. See below for anticoagulant management.

## 2016-04-20 NOTE — Assessment & Plan Note (Addendum)
See [facility] management discussion.

## 2016-04-20 NOTE — Assessment & Plan Note (Signed)
Recent dx UTI at rehab (UCx results requested), started on cipro 500mg  bid (04/13/2016). Given CKD with GFR 43, will decrease cipro dose to 250mg  bid to complete 10d course (04/23/2016). Check INR today. See below for coumadin dosing.

## 2016-04-20 NOTE — Assessment & Plan Note (Addendum)
Anticoagulated CVA 12/2015, now s/p fall with femoral neck fracture s/p hip pinning surgery. See below for [facility] management discussion.  Completed rehab, now at home with HHPT involved. Wife disabled after R tibial plateau fracture making patient care more difficult.

## 2016-04-20 NOTE — Assessment & Plan Note (Signed)
Stable on effexor 75mg  bid.

## 2016-04-20 NOTE — Assessment & Plan Note (Deleted)
Anticoagulated CVA 12/2015, now s/p fall with femoral neck fracture s/p ORIF. See below for [facility] management discussion.  Completed rehab, now at home with HHPT involved. Wife disabled after R tibial plateau fracture.

## 2016-04-20 NOTE — Assessment & Plan Note (Signed)
Oxybutynin stopped after recent fall. myrbetriq started by Dr Margarito Liner, f/u appt scheduled 8/26.

## 2016-04-21 ENCOUNTER — Encounter: Payer: Self-pay | Admitting: Family Medicine

## 2016-04-21 DIAGNOSIS — N183 Chronic kidney disease, stage 3 (moderate): Secondary | ICD-10-CM | POA: Diagnosis not present

## 2016-04-21 DIAGNOSIS — Z9181 History of falling: Secondary | ICD-10-CM | POA: Diagnosis not present

## 2016-04-21 DIAGNOSIS — S72002D Fracture of unspecified part of neck of left femur, subsequent encounter for closed fracture with routine healing: Secondary | ICD-10-CM | POA: Diagnosis not present

## 2016-04-21 DIAGNOSIS — Z8781 Personal history of (healed) traumatic fracture: Secondary | ICD-10-CM | POA: Diagnosis not present

## 2016-04-21 DIAGNOSIS — I129 Hypertensive chronic kidney disease with stage 1 through stage 4 chronic kidney disease, or unspecified chronic kidney disease: Secondary | ICD-10-CM | POA: Diagnosis not present

## 2016-04-21 DIAGNOSIS — Z8673 Personal history of transient ischemic attack (TIA), and cerebral infarction without residual deficits: Secondary | ICD-10-CM | POA: Diagnosis not present

## 2016-04-22 NOTE — Telephone Encounter (Signed)
Please see Mychart message.

## 2016-04-23 DIAGNOSIS — Z9181 History of falling: Secondary | ICD-10-CM | POA: Diagnosis not present

## 2016-04-23 DIAGNOSIS — S72002D Fracture of unspecified part of neck of left femur, subsequent encounter for closed fracture with routine healing: Secondary | ICD-10-CM | POA: Diagnosis not present

## 2016-04-23 DIAGNOSIS — M1712 Unilateral primary osteoarthritis, left knee: Secondary | ICD-10-CM | POA: Diagnosis not present

## 2016-04-23 DIAGNOSIS — Z8673 Personal history of transient ischemic attack (TIA), and cerebral infarction without residual deficits: Secondary | ICD-10-CM | POA: Diagnosis not present

## 2016-04-23 DIAGNOSIS — N183 Chronic kidney disease, stage 3 (moderate): Secondary | ICD-10-CM | POA: Diagnosis not present

## 2016-04-23 DIAGNOSIS — I129 Hypertensive chronic kidney disease with stage 1 through stage 4 chronic kidney disease, or unspecified chronic kidney disease: Secondary | ICD-10-CM | POA: Diagnosis not present

## 2016-04-23 DIAGNOSIS — Z8781 Personal history of (healed) traumatic fracture: Secondary | ICD-10-CM | POA: Diagnosis not present

## 2016-04-24 ENCOUNTER — Other Ambulatory Visit (INDEPENDENT_AMBULATORY_CARE_PROVIDER_SITE_OTHER): Payer: Medicare Other

## 2016-04-24 DIAGNOSIS — I639 Cerebral infarction, unspecified: Secondary | ICD-10-CM | POA: Diagnosis not present

## 2016-04-24 DIAGNOSIS — Z5181 Encounter for therapeutic drug level monitoring: Secondary | ICD-10-CM

## 2016-04-24 LAB — POCT INR: INR: 2.2

## 2016-04-25 ENCOUNTER — Telehealth: Payer: Self-pay | Admitting: Family Medicine

## 2016-04-25 DIAGNOSIS — Z8781 Personal history of (healed) traumatic fracture: Secondary | ICD-10-CM | POA: Diagnosis not present

## 2016-04-25 DIAGNOSIS — I129 Hypertensive chronic kidney disease with stage 1 through stage 4 chronic kidney disease, or unspecified chronic kidney disease: Secondary | ICD-10-CM | POA: Diagnosis not present

## 2016-04-25 DIAGNOSIS — S72002D Fracture of unspecified part of neck of left femur, subsequent encounter for closed fracture with routine healing: Secondary | ICD-10-CM | POA: Diagnosis not present

## 2016-04-25 DIAGNOSIS — Z9181 History of falling: Secondary | ICD-10-CM | POA: Diagnosis not present

## 2016-04-25 DIAGNOSIS — N183 Chronic kidney disease, stage 3 (moderate): Secondary | ICD-10-CM | POA: Diagnosis not present

## 2016-04-25 DIAGNOSIS — Z8673 Personal history of transient ischemic attack (TIA), and cerebral infarction without residual deficits: Secondary | ICD-10-CM | POA: Diagnosis not present

## 2016-04-25 NOTE — Telephone Encounter (Signed)
Kitty called to get pt  PT/INR results

## 2016-04-25 NOTE — Telephone Encounter (Signed)
See result note. Also released via [employer]

## 2016-04-25 NOTE — Telephone Encounter (Signed)
Spoke with patient. He asked that I call back and speak to his wife later bc she wasn't available at the moment.

## 2016-04-28 DIAGNOSIS — I129 Hypertensive chronic kidney disease with stage 1 through stage 4 chronic kidney disease, or unspecified chronic kidney disease: Secondary | ICD-10-CM | POA: Diagnosis not present

## 2016-04-28 DIAGNOSIS — N183 Chronic kidney disease, stage 3 (moderate): Secondary | ICD-10-CM | POA: Diagnosis not present

## 2016-04-28 DIAGNOSIS — Z8781 Personal history of (healed) traumatic fracture: Secondary | ICD-10-CM | POA: Diagnosis not present

## 2016-04-28 DIAGNOSIS — S72002D Fracture of unspecified part of neck of left femur, subsequent encounter for closed fracture with routine healing: Secondary | ICD-10-CM | POA: Diagnosis not present

## 2016-04-28 DIAGNOSIS — Z9181 History of falling: Secondary | ICD-10-CM | POA: Diagnosis not present

## 2016-04-28 DIAGNOSIS — Z8673 Personal history of transient ischemic attack (TIA), and cerebral infarction without residual deficits: Secondary | ICD-10-CM | POA: Diagnosis not present

## 2016-04-30 DIAGNOSIS — S72002D Fracture of unspecified part of neck of left femur, subsequent encounter for closed fracture with routine healing: Secondary | ICD-10-CM | POA: Diagnosis not present

## 2016-04-30 DIAGNOSIS — Z8781 Personal history of (healed) traumatic fracture: Secondary | ICD-10-CM | POA: Diagnosis not present

## 2016-04-30 DIAGNOSIS — N183 Chronic kidney disease, stage 3 (moderate): Secondary | ICD-10-CM | POA: Diagnosis not present

## 2016-04-30 DIAGNOSIS — I129 Hypertensive chronic kidney disease with stage 1 through stage 4 chronic kidney disease, or unspecified chronic kidney disease: Secondary | ICD-10-CM | POA: Diagnosis not present

## 2016-04-30 DIAGNOSIS — Z9181 History of falling: Secondary | ICD-10-CM | POA: Diagnosis not present

## 2016-04-30 DIAGNOSIS — Z8673 Personal history of transient ischemic attack (TIA), and cerebral infarction without residual deficits: Secondary | ICD-10-CM | POA: Diagnosis not present

## 2016-05-01 ENCOUNTER — Encounter: Payer: Self-pay | Admitting: Neurology

## 2016-05-01 ENCOUNTER — Ambulatory Visit (INDEPENDENT_AMBULATORY_CARE_PROVIDER_SITE_OTHER): Payer: Medicare Other | Admitting: Neurology

## 2016-05-01 ENCOUNTER — Other Ambulatory Visit (INDEPENDENT_AMBULATORY_CARE_PROVIDER_SITE_OTHER): Payer: Medicare Other

## 2016-05-01 VITALS — BP 108/65 | HR 73 | Ht 66.0 in | Wt 164.0 lb

## 2016-05-01 DIAGNOSIS — F0390 Unspecified dementia without behavioral disturbance: Secondary | ICD-10-CM

## 2016-05-01 DIAGNOSIS — I639 Cerebral infarction, unspecified: Secondary | ICD-10-CM

## 2016-05-01 DIAGNOSIS — R269 Unspecified abnormalities of gait and mobility: Secondary | ICD-10-CM

## 2016-05-01 DIAGNOSIS — Z5181 Encounter for therapeutic drug level monitoring: Secondary | ICD-10-CM

## 2016-05-01 LAB — POCT INR: INR: 2.7

## 2016-05-01 NOTE — Progress Notes (Signed)
Reason for visit: Gait disorder, memory disorder  [name] is an 82 y.o. male  History of present illness:  [name] is an 82 year old right-handed white male with a history of a benign essential tremor, and a chronic gait disorder. The patient walks with a walker, but he has continued to be a risk for falls. Since last seen in February, the patient was admitted to the hospital on 11/12/2015 with a left parietal stroke associated with an aphasia syndrome. This seemed to clear, workup revealed evidence of a high-grade left M2 segment stenosis. The patient has a prominent family history of hypercoagulable state issues, he has been seen by Dr. Irene Limbo from oncology, he has been treated with Coumadin and aspirin. The patient unfortunately was admitted to the hospital on 03/09/2016 following a fall with a fracture of the left hip. The patient tripped over his walker. The patient is still in physical therapy, he reports some discomfort of the left hip, he continues to have significant issues with walking. The memory has gradually worsened some over time. MRI of the brain done recently showed a moderate level small vessel ischemic changes. The patient has urinary urgency and incontinence, he is now on Myrbetriq for this. The patient occasionally will note right occipital neuralgia pain. He also reports some left knee discomfort and some pain in the left hip with standing. The patient recently had an injection in the left knee. He comes to this office for an evaluation.   Past Medical History:  Diagnosis Date  . Allergic rhinitis   . Arthritis   . Asthma    remote  . Blood transfusion 1990's  . CAD (coronary artery disease)    cath 2000 30% single vessel, normal nuclear stress test 12/03/2010, no evidence ischemia  . Candidal urethritis in male 06/15/2015  . CKD (chronic kidney disease) stage 3, GFR 30-59 ml/min    baseline Cr 1.7  . Closed C7 fracture ([place]) 11/06/2014  . DDD (degenerative  disc disease) 03/2013   by CT, diffuse multilevel cervical and lumbar spondylosis  . Depression   . DISH (diffuse idiopathic skeletal hyperostosis) 03/2013   lumbar spine on xray  . Dizziness    multifactorial, s/p PT at [facility] 06/2014 with HEP  . Elevated PSA    previous-normalized (followed by Dr. Jeffie Pollock, rec no repeat unless urinary sxs)  . Essential tremor 09/27/2008   reviewed eval by Dr Jannifer Franklin in chart 2011   . GERD (gastroesophageal reflux disease)    h/o PUD  . Hearing loss 06/2012   eval - rec annual exam  . History of fracture of right hip   . History of phlebitis   . History of pyelonephritis 05/2014   hospitalization with sepsis  . HLD (hyperlipidemia)    hypertriglyceridemia  . Hx pulmonary embolism 08/1991   negative hypercoagulable panel 12/2014  . Macrocytic anemia 2014   stable B12/folate and periph smear 05/2013, again periph smear 2017 with mature cells, mild dacrocytosis  . Osteoporosis 11/2013   DEXA T score -2.7  . Prediabetes   . Schatzki's ring 02/2012   s/p dilation [name]  . Sleep apnea    no CPAP- sleep apnea "cleared up 15 years ago"  . Stroke ([place])   . Urge incontinence   . UTI (urinary tract infection) 05/13/2015  . UTI (urinary tract infection) due to Enterococcus 05/13/2015    Past Surgical History:  Procedure Laterality Date  . APPENDECTOMY  1996  . CARDIAC CATHETERIZATION  2000  30% one vessel  . CARDIOVASCULAR STRESS TEST  03/2015   no ischemia, low risk, EF 76%  . CAROTID U/S  12/28/2007   nml  . CATARACT EXTRACTION  09/2009   bilateral  . COLONOSCOPY  1998   [place]  . COLONOSCOPY  07/2010   5 polyps, adenomatous, rec rpt 3 yrs  . COLONOSCOPY  04/2014   3 polyps, adenomatous, f/u open ended given age [name]  . CT ABD W & PELVIS WO CM  07/2001   Scarring of right lung, stable negative o/w  . CT ABD W & PELVIS WO CM  11/2000   ? stones, right LL scarring  . CYSTOSCOPY  1992   for kidney stones  . EEG  11/12/2009   nml  .  ESOPHAGOGASTRODUODENOSCOPY  02/2012   dilation of schatzki's ring [name]  . ESOPHAGOGASTRODUODENOSCOPY N/A 11/15/2015   Procedure: ESOPHAGOGASTRODUODENOSCOPY (EGD);  Surgeon: Gatha Mayer, MD;  Location: [facility] ENDOSCOPY;  Service: Endoscopy;  Laterality: N/A;  . [place]   with incidental appendectomy  . HERNIA REPAIR  1979   Left  . HIP PINNING,CANNULATED Left 03/11/2016   Procedure: CANNULATED HIP PINNING;  Surgeon: Rod Can, MD;  Location: [facility];  Service: Orthopedics;  Laterality: Left;  . KNEE ARTHROSCOPY  03/24/02   Right (Dr. Mauri Pole)  . MRI  11/2009   Head, nml  . ORIF FEMORAL NECK FRACTURE W/ DHS Right 08/03/2002   Dr Mauri Pole  . TONSILLECTOMY  1965  . US ECHOCARDIOGRAPHY  12/28/2007   Mild aortic valve calcification EF 55%, basically nml  . V/Q SCAN  06/1999   negative    Family History  Problem Relation Age of Onset  . Stroke Mother   . Hypertension Mother   . Cancer Brother     prostate with mets  . Blindness Brother     legally  . Diabetes Brother   . Pulmonary embolism Sister     from shoulder operation  . Alcohol abuse Brother   . Colon cancer Neg Hx   . Esophageal cancer Neg Hx   . Rectal cancer Neg Hx   . Stomach cancer Neg Hx     Social history:  reports that he has never smoked. He has never used smokeless tobacco. He reports that he does not drink alcohol or use drugs.    Allergies  Allergen Reactions  . Lactose Intolerance (Gi)   . Nizatidine     REACTION: Rash (Axid)  . Sulfadiazine     REACTION: Hives  . Aricept [Donepezil Hcl] Rash  . Lipitor [Atorvastatin] Rash  . Penicillins Rash    Has patient had a PCN reaction causing immediate rash, facial/tongue/throat swelling, SOB or lightheadedness with hypotension: No Has patient had a PCN reaction causing severe rash involving mucus membranes or skin necrosis: No Has patient had a PCN reaction that required hospitalization No Has patient had a PCN reaction occurring within  the last 10 years: No If all of the above answers are "NO",  then may proceed with Cephalosporin use.    Medications:  Prior to Admission medications   Medication Sig Start Date End Date Taking? Authorizing Provider  albuterol (PROVENTIL HFA;VENTOLIN HFA) 108 (90 Base) MCG/ACT inhaler Inhale 2 puffs into the lungs every 6 (six) hours as needed for wheezing or shortness of breath. 01/10/16  Yes Ria Bush, MD  b complex vitamins tablet Take 1 tablet by mouth daily.   Yes Historical Provider, MD  Biotin 2500 MCG CAPS  Take 1 capsule by mouth daily.   Yes Historical Provider, MD  Cholecalciferol (VITAMIN D) 2000 UNITS CAPS Take 1 capsule by mouth daily. Reported on 01/30/2016   Yes Historical Provider, MD  clotrimazole (LOTRIMIN) 1 % cream Apply 1 application topically 2 (two) times daily. Patient taking differently: Apply 1 application topically 2 (two) times daily as needed. For rash 05/02/15  Yes Pleas Koch, NP  Coenzyme Q10 (COQ-10) 100 MG CAPS Take 1 capsule by mouth daily.    Yes Historical Provider, MD  Cyanocobalamin (B-12 PO) Take 10,000 mcg by mouth every Monday, Wednesday, and Friday.    Yes Historical Provider, MD  dexlansoprazole (DEXILANT) 60 MG capsule Take 1 capsule (60 mg total) by mouth daily. 01/10/16  Yes Ria Bush, MD  docusate sodium (COLACE) 100 MG capsule Take 100 mg by mouth at bedtime.    Yes Historical Provider, MD  fenofibrate (TRICOR) 145 MG tablet Take 1 tablet (145 mg total) by mouth daily. 01/10/16  Yes Ria Bush, MD  fexofenadine (ALLEGRA) 180 MG tablet Take 180 mg by mouth daily.    Yes Historical Provider, MD  gabapentin (NEURONTIN) 100 MG capsule Take 2 capsules (200 mg total) by mouth 3 (three) times daily. Patient taking differently: Take 300 mg by mouth 2 (two) times daily.  09/21/15  Yes Kathrynn Ducking, MD  JANTOVEN 2.5 MG tablet  02/25/16  Yes Historical Provider, MD  mirabegron ER (MYRBETRIQ) 25 MG TB24 tablet Take 25 mg by mouth daily.    Yes Historical Provider, MD  Multiple Vitamins-Minerals (MULTIVITAMIN PO) Take 1 tablet by mouth daily.    Yes Historical Provider, MD  simvastatin (ZOCOR) 40 MG tablet Take 1 tablet (40 mg total) by mouth daily. 01/10/16  Yes Ria Bush, MD  sorbitol 70 % SOLN Take 30 mLs by mouth daily as needed for moderate constipation. 03/13/16  Yes Reyne Dumas, MD  venlafaxine (EFFEXOR) 75 MG tablet Take 1 tablet (75 mg total) by mouth 2 (two) times daily with a meal. 01/10/16  Yes Ria Bush, MD    ROS:  Out of a complete 14 system review of symptoms, the patient complains only of the following symptoms, and all other reviewed systems are negative.  Decreased activity Difficulty swallowing Leg swelling Cold intolerance Incontinence of the bowels Incontinence of the bladder, frequency of urination, urinary urgency Joint pain, walking difficulty Memory loss, tremors Confusion, depression  Blood pressure 108/65, pulse 73, height 5\' 6"  (1.676 m), weight 164 lb (74.4 kg).  Physical Exam  General: The patient is alert and cooperative at the time of the examination.  Skin: No significant peripheral edema is noted.   Neurologic Exam  Mental status: The patient is alert and oriented x 2 at the time of the examination (not oriented to date). The Mini-Mental Status Examination done today shows a total score of 24/30.   Cranial nerves: Facial symmetry is present. Speech is normal, no aphasia or dysarthria is noted. Extraocular movements are full. Visual fields are full.  Motor: The patient has good strength in all 4 extremities.  Sensory examination: Soft touch sensation is symmetric on the face, arms, and legs.  Coordination: The patient has good finger-nose-finger and heel-to-shin bilaterally. The patient does have postural tremors on both upper extremities.  Gait and station: The patient is able to stand independently by pushing off of his arms. Once up, the patient can walk short  distance with assistance but he appears to have a very prominent limping gait on the  left leg. He is putting very little pressure on the leg. Gait is slightly wide-based.  Reflexes: Deep tendon reflexes are symmetric.   MRI brain 10/04/15:  IMPRESSION:  Abnormal MRI brain (without) demonstrating: 1. Mild diffuse, moderate mesial temporal and corpus callosum atrophy. 2. Mild periventricular, subcortical and pontine chronic small vessel ischemic disease.  3. No acute findings. 4. No change from MRI on 11/06/14.   * MRI scan images were reviewed online. I agree with the written report.    Assessment/Plan:  1. Gait disorder, multifactorial  2. Benign essential tremor  3. Memory disorder  4. Cerebrovascular disease  The patient has ongoing significant issues with gait instability. The patient likely has a multifactorial issue with walking. The patient has a moderate level of small vessel disease, he has a benign essential tremor that is often times associated with gait instability, and he appears to have some discomfort in the left knee and hip with weightbearing. He has a very prominent limping gait on the left leg. The patient is getting physical therapy at this point, he may need to be reevaluated for the left hip issue. The patient remains on anticoagulation. We will check MRI of the cervical spine given the history of a C7 cervical fracture previously. The patient will follow-up in 4 months, he will continue the physical therapy.   Jill Alexanders MD 05/01/2016 10:29 AM  [facility] [address] [place], [place] [address]  Phone [phone] Fax [phone]

## 2016-05-01 NOTE — Patient Instructions (Addendum)
We will check MRI of the cervical spine.    Fall Prevention in the Home  Falls can cause injuries and can affect people from all age groups. There are many simple things that you can do to make your home safe and to help prevent falls. WHAT CAN I DO ON THE OUTSIDE OF MY HOME?  Regularly repair the edges of walkways and driveways and fix any cracks.  Remove high doorway thresholds.  Trim any shrubbery on the main path into your home.  Use bright outdoor lighting.  Clear walkways of debris and clutter, including tools and rocks.  Regularly check that handrails are securely fastened and in good repair. Both sides of any steps should have handrails.  Install guardrails along the edges of any raised decks or porches.  Have leaves, snow, and ice cleared regularly.  Use sand or salt on walkways during winter months.  In the garage, clean up any spills right away, including grease or oil spills. WHAT CAN I DO IN THE BATHROOM?  Use night lights.  Install grab bars by the toilet and in the tub and shower. Do not use towel bars as grab bars.  Use non-skid mats or decals on the floor of the tub or shower.  If you need to sit down while you are in the shower, use a plastic, non-slip stool.Marland Kitchen  Keep the floor dry. Immediately clean up any water that spills on the floor.  Remove soap buildup in the tub or shower on a regular basis.  Attach bath mats securely with double-sided non-slip rug tape.  Remove throw rugs and other tripping hazards from the floor. WHAT CAN I DO IN THE BEDROOM?  Use night lights.  Make sure that a bedside light is easy to reach.  Do not use oversized bedding that drapes onto the floor.  Have a firm chair that has side arms to use for getting dressed.  Remove throw rugs and other tripping hazards from the floor. WHAT CAN I DO IN THE KITCHEN?   Clean up any spills right away.  Avoid walking on wet floors.  Place frequently used items in  easy-to-reach places.  If you need to reach for something above you, use a sturdy step stool that has a grab bar.  Keep electrical cables out of the way.  Do not use floor polish or wax that makes floors slippery. If you have to use wax, make sure that it is non-skid floor wax.  Remove throw rugs and other tripping hazards from the floor. WHAT CAN I DO IN THE STAIRWAYS?  Do not leave any items on the stairs.  Make sure that there are handrails on both sides of the stairs. Fix handrails that are broken or loose. Make sure that handrails are as long as the stairways.  Check any carpeting to make sure that it is firmly attached to the stairs. Fix any carpet that is loose or worn.  Avoid having throw rugs at the top or bottom of stairways, or secure the rugs with carpet tape to prevent them from moving.  Make sure that you have a light switch at the top of the stairs and the bottom of the stairs. If you do not have them, have them installed. WHAT ARE SOME OTHER FALL PREVENTION TIPS?  Wear closed-toe shoes that fit well and support your feet. Wear shoes that have rubber soles or low heels.  When you use a stepladder, make sure that it is completely opened and that  the sides are firmly locked. Have someone hold the ladder while you are using it. Do not climb a closed stepladder.  Add color or contrast paint or tape to grab bars and handrails in your home. Place contrasting color strips on the first and last steps.  Use mobility aids as needed, such as canes, walkers, scooters, and crutches.  Turn on lights if it is dark. Replace any light bulbs that burn out.  Set up furniture so that there are clear paths. Keep the furniture in the same spot.  Fix any uneven floor surfaces.  Choose a carpet design that does not hide the edge of steps of a stairway.  Be aware of any and all pets.  Review your medicines with your healthcare provider. Some medicines can cause dizziness or changes in  blood pressure, which increase your risk of falling. Talk with your health care provider about other ways that you can decrease your risk of falls. This may include working with a physical therapist or trainer to improve your strength, balance, and endurance.   This information is not intended to replace advice given to you by your health care provider. Make sure you discuss any questions you have with your health care provider.   Document Released: 08/15/2002 Document Revised: 01/09/2015 Document Reviewed: 09/29/2014 Elsevier Interactive Patient Education [employer].

## 2016-05-02 ENCOUNTER — Encounter: Payer: Self-pay | Admitting: *Deleted

## 2016-05-02 ENCOUNTER — Other Ambulatory Visit: Payer: Medicare Other

## 2016-05-02 DIAGNOSIS — Z8673 Personal history of transient ischemic attack (TIA), and cerebral infarction without residual deficits: Secondary | ICD-10-CM | POA: Diagnosis not present

## 2016-05-02 DIAGNOSIS — I129 Hypertensive chronic kidney disease with stage 1 through stage 4 chronic kidney disease, or unspecified chronic kidney disease: Secondary | ICD-10-CM | POA: Diagnosis not present

## 2016-05-02 DIAGNOSIS — S72002D Fracture of unspecified part of neck of left femur, subsequent encounter for closed fracture with routine healing: Secondary | ICD-10-CM | POA: Diagnosis not present

## 2016-05-02 DIAGNOSIS — N183 Chronic kidney disease, stage 3 (moderate): Secondary | ICD-10-CM | POA: Diagnosis not present

## 2016-05-02 DIAGNOSIS — Z9181 History of falling: Secondary | ICD-10-CM | POA: Diagnosis not present

## 2016-05-02 DIAGNOSIS — Z8781 Personal history of (healed) traumatic fracture: Secondary | ICD-10-CM | POA: Diagnosis not present

## 2016-05-05 DIAGNOSIS — N3941 Urge incontinence: Secondary | ICD-10-CM | POA: Diagnosis not present

## 2016-05-07 ENCOUNTER — Ambulatory Visit
Admission: RE | Admit: 2016-05-07 | Discharge: 2016-05-07 | Disposition: A | Discharge status: Home / Self Care | Payer: Medicare Other | Source: Ambulatory Visit | Attending: Neurology | Admitting: Neurology

## 2016-05-07 DIAGNOSIS — R269 Unspecified abnormalities of gait and mobility: Secondary | ICD-10-CM | POA: Diagnosis not present

## 2016-05-07 DIAGNOSIS — I129 Hypertensive chronic kidney disease with stage 1 through stage 4 chronic kidney disease, or unspecified chronic kidney disease: Secondary | ICD-10-CM | POA: Diagnosis not present

## 2016-05-07 DIAGNOSIS — N183 Chronic kidney disease, stage 3 (moderate): Secondary | ICD-10-CM | POA: Diagnosis not present

## 2016-05-07 DIAGNOSIS — Z8673 Personal history of transient ischemic attack (TIA), and cerebral infarction without residual deficits: Secondary | ICD-10-CM | POA: Diagnosis not present

## 2016-05-07 DIAGNOSIS — Z9181 History of falling: Secondary | ICD-10-CM | POA: Diagnosis not present

## 2016-05-07 DIAGNOSIS — Z8781 Personal history of (healed) traumatic fracture: Secondary | ICD-10-CM | POA: Diagnosis not present

## 2016-05-07 DIAGNOSIS — S72002D Fracture of unspecified part of neck of left femur, subsequent encounter for closed fracture with routine healing: Secondary | ICD-10-CM | POA: Diagnosis not present

## 2016-05-08 ENCOUNTER — Encounter: Payer: Self-pay | Admitting: Family Medicine

## 2016-05-09 ENCOUNTER — Telehealth: Payer: Self-pay | Admitting: Neurology

## 2016-05-09 DIAGNOSIS — Z8673 Personal history of transient ischemic attack (TIA), and cerebral infarction without residual deficits: Secondary | ICD-10-CM | POA: Diagnosis not present

## 2016-05-09 DIAGNOSIS — Z8781 Personal history of (healed) traumatic fracture: Secondary | ICD-10-CM | POA: Diagnosis not present

## 2016-05-09 DIAGNOSIS — N183 Chronic kidney disease, stage 3 (moderate): Secondary | ICD-10-CM | POA: Diagnosis not present

## 2016-05-09 DIAGNOSIS — Z9181 History of falling: Secondary | ICD-10-CM | POA: Diagnosis not present

## 2016-05-09 DIAGNOSIS — I129 Hypertensive chronic kidney disease with stage 1 through stage 4 chronic kidney disease, or unspecified chronic kidney disease: Secondary | ICD-10-CM | POA: Diagnosis not present

## 2016-05-09 DIAGNOSIS — S72002D Fracture of unspecified part of neck of left femur, subsequent encounter for closed fracture with routine healing: Secondary | ICD-10-CM | POA: Diagnosis not present

## 2016-05-09 NOTE — Telephone Encounter (Signed)
  I called the patient, talk with the wife. MRI of the cervical spine does not show any spinal stenosis or cord compression that would impact balance.  MRI cervical 05/08/16:  IMPRESSION:  Abnormal MRI cervical spine (without) demonstrating: 1. Multi-level flowing ossifications anteriorly from C2-3 to C7-T1, consistent with diffuse idiopathic skeletal hyperostosis (DISH).  2. At C6-7: disc bulging with mild spinal stenosis and mild biforaminal stenosis. 3. At mild disc bulging at C5-6, C6-7 and C7-T1.

## 2016-05-12 DIAGNOSIS — Z8673 Personal history of transient ischemic attack (TIA), and cerebral infarction without residual deficits: Secondary | ICD-10-CM | POA: Diagnosis not present

## 2016-05-12 DIAGNOSIS — Z9181 History of falling: Secondary | ICD-10-CM | POA: Diagnosis not present

## 2016-05-12 DIAGNOSIS — N183 Chronic kidney disease, stage 3 (moderate): Secondary | ICD-10-CM | POA: Diagnosis not present

## 2016-05-12 DIAGNOSIS — S72002D Fracture of unspecified part of neck of left femur, subsequent encounter for closed fracture with routine healing: Secondary | ICD-10-CM | POA: Diagnosis not present

## 2016-05-12 DIAGNOSIS — Z8781 Personal history of (healed) traumatic fracture: Secondary | ICD-10-CM | POA: Diagnosis not present

## 2016-05-12 DIAGNOSIS — I129 Hypertensive chronic kidney disease with stage 1 through stage 4 chronic kidney disease, or unspecified chronic kidney disease: Secondary | ICD-10-CM | POA: Diagnosis not present

## 2016-05-16 ENCOUNTER — Ambulatory Visit (INDEPENDENT_AMBULATORY_CARE_PROVIDER_SITE_OTHER): Payer: Medicare Other | Admitting: Family Medicine

## 2016-05-16 ENCOUNTER — Encounter: Payer: Self-pay | Admitting: Family Medicine

## 2016-05-16 VITALS — BP 116/58 | HR 88 | Temp 98.1°F | Wt 168.0 lb

## 2016-05-16 DIAGNOSIS — I639 Cerebral infarction, unspecified: Secondary | ICD-10-CM | POA: Diagnosis not present

## 2016-05-16 DIAGNOSIS — N3941 Urge incontinence: Secondary | ICD-10-CM | POA: Diagnosis not present

## 2016-05-16 DIAGNOSIS — R791 Abnormal coagulation profile: Secondary | ICD-10-CM

## 2016-05-16 DIAGNOSIS — S72002D Fracture of unspecified part of neck of left femur, subsequent encounter for closed fracture with routine healing: Secondary | ICD-10-CM | POA: Diagnosis not present

## 2016-05-16 DIAGNOSIS — G250 Essential tremor: Secondary | ICD-10-CM

## 2016-05-16 DIAGNOSIS — R269 Unspecified abnormalities of gait and mobility: Secondary | ICD-10-CM

## 2016-05-16 DIAGNOSIS — Z9181 History of falling: Secondary | ICD-10-CM | POA: Diagnosis not present

## 2016-05-16 DIAGNOSIS — I129 Hypertensive chronic kidney disease with stage 1 through stage 4 chronic kidney disease, or unspecified chronic kidney disease: Secondary | ICD-10-CM | POA: Diagnosis not present

## 2016-05-16 DIAGNOSIS — Z23 Encounter for immunization: Secondary | ICD-10-CM | POA: Diagnosis not present

## 2016-05-16 DIAGNOSIS — Z7901 Long term (current) use of anticoagulants: Secondary | ICD-10-CM | POA: Diagnosis not present

## 2016-05-16 DIAGNOSIS — Z8673 Personal history of transient ischemic attack (TIA), and cerebral infarction without residual deficits: Secondary | ICD-10-CM | POA: Diagnosis not present

## 2016-05-16 DIAGNOSIS — Z8781 Personal history of (healed) traumatic fracture: Secondary | ICD-10-CM | POA: Diagnosis not present

## 2016-05-16 DIAGNOSIS — N183 Chronic kidney disease, stage 3 (moderate): Secondary | ICD-10-CM | POA: Diagnosis not present

## 2016-05-16 LAB — POCT INR: INR: 1.8

## 2016-05-16 NOTE — Assessment & Plan Note (Signed)
Healing well. Completing home PT. Will refer to outpatient PT.

## 2016-05-16 NOTE — Progress Notes (Signed)
Pre visit review using our clinic review tool, if applicable. No additional management support is needed unless otherwise documented below in the visit note. 

## 2016-05-16 NOTE — Progress Notes (Signed)
BP (!) 116/58   Pulse 88   Temp 98.1 F (36.7 C) (Oral)   Wt 168 lb (76.2 kg)   BMI 27.12 kg/m    CC: 98mo f/u visit Subjective:    Patient ID: [name], male    DOB: 06/25/1931, 82 y.o.   MRN: [id number]  HPI: [name] is a 82 y.o. male presenting on 05/16/2016 for Follow-up   See prior note for details.  Has completed full 8 wks coumadin post hip surgery.   Coumadin was stopped for ~6 months late 2016, then pt suffered ischemic stroke, was found to have elevated factor VIII levels making him more prone to recurrent thrombosis. Now has suffered fall with resultant left hip fracture.   Saw neurology in interim - MRI cervical spine with DISH but without significant finding that would affect balance.  Saw urologist who rec continue myrbetriq despite ongoing urinary incontinence - unable to afford this. Will need to stop after samples run out. No good alternatives.  HHPT recommended outpatient PT and aquatic PT. Requests referral today.  Planning trip out of town Oct ([place]) and Dec ([place])  Relevant past medical, surgical, family and social history reviewed and updated as indicated. Interim medical history since our last visit reviewed. Allergies and medications reviewed and updated. Current Outpatient Prescriptions on File Prior to Visit  Medication Sig  . albuterol (PROVENTIL HFA;VENTOLIN HFA) 108 (90 Base) MCG/ACT inhaler Inhale 2 puffs into the lungs every 6 (six) hours as needed for wheezing or shortness of breath.  Marland Kitchen b complex vitamins tablet Take 1 tablet by mouth daily.  . Biotin 2500 MCG CAPS Take 1 capsule by mouth daily.  . Cholecalciferol (VITAMIN D) 2000 UNITS CAPS Take 1 capsule by mouth daily. Reported on 01/30/2016  . clotrimazole (LOTRIMIN) 1 % cream Apply 1 application topically 2 (two) times daily. (Patient taking differently: Apply 1 application topically 2 (two) times daily as needed. For rash)  . Coenzyme Q10 (COQ-10) 100 MG CAPS Take  1 capsule by mouth daily.   . Cyanocobalamin (B-12 PO) Take 10,000 mcg by mouth every Monday, Wednesday, and Friday.   Marland Kitchen dexlansoprazole (DEXILANT) 60 MG capsule Take 1 capsule (60 mg total) by mouth daily.  Marland Kitchen docusate sodium (COLACE) 100 MG capsule Take 100 mg by mouth at bedtime.   . fenofibrate (TRICOR) 145 MG tablet Take 1 tablet (145 mg total) by mouth daily.  . fexofenadine (ALLEGRA) 180 MG tablet Take 180 mg by mouth daily.   Marland Kitchen gabapentin (NEURONTIN) 100 MG capsule Take 2 capsules (200 mg total) by mouth 3 (three) times daily. (Patient taking differently: Take 300 mg by mouth 2 (two) times daily. )  . mirabegron ER (MYRBETRIQ) 25 MG TB24 tablet Take 25 mg by mouth daily.  . Multiple Vitamins-Minerals (MULTIVITAMIN PO) Take 1 tablet by mouth daily.   . simvastatin (ZOCOR) 40 MG tablet Take 1 tablet (40 mg total) by mouth daily.  Marland Kitchen venlafaxine (EFFEXOR) 75 MG tablet Take 1 tablet (75 mg total) by mouth 2 (two) times daily with a meal.   No current facility-administered medications on file prior to visit.     Review of Systems Per HPI unless specifically indicated in ROS section     Objective:    BP (!) 116/58   Pulse 88   Temp 98.1 F (36.7 C) (Oral)   Wt 168 lb (76.2 kg)   BMI 27.12 kg/m   Wt Readings from Last 3 Encounters:  05/16/16 168 lb (  76.2 kg)  05/01/16 164 lb (74.4 kg)  04/16/16 161 lb (73 kg)    Physical Exam  Constitutional: He appears well-developed and well-nourished. No distress.  HENT:  Mouth/Throat: Oropharynx is clear and moist. No oropharyngeal exudate.  Eyes: Conjunctivae and EOM are normal. Pupils are equal, round, and reactive to light. No scleral icterus.  Neck: Normal range of motion. Neck supple.  Cardiovascular: Normal rate, regular rhythm, normal heart sounds and intact distal pulses.   No murmur heard. Pulmonary/Chest: Effort normal and breath sounds normal. No respiratory distress. He has no wheezes. He has no rales.  Skin: Skin is warm and  dry. No rash noted.  Psychiatric: He has a normal mood and affect.  Nursing note and vitals reviewed.  Results for orders placed or performed in visit on 05/16/16  POCT INR  Result Value Ref Range   INR 1.8       Assessment & Plan:   Problem List Items Addressed This Visit    Acute ischemic stroke [facility])   Relevant Medications   aspirin EC 325 MG tablet   Elevated factor VIII level    Again reviewed pros/cons of anticoagulation with coumadin vs full dose aspirin (bleeding risk in setting of recurrent falls vs stroke/thrombosis in h/o elevated factor 8 levels). Will stop coumadin, start full dose aspirin.       Encounter for current long-term use of anticoagulants - Primary    Again reviewed pros/cons of anticoagulation with coumadin vs full dose aspirin (bleeding risk in setting of recurrent falls vs stroke/thrombosis in elevated factor 8 levels). Will stop coumadin, start full dose aspirin.       Essential tremor    Appreciate neuro eval.       Left displaced femoral neck fracture (HCC)    Healing well. Completing home PT. Will refer to outpatient PT.       Relevant Orders   Ambulatory referral to Physical Therapy   UNSTEADY GAIT    Recent cervical MRI with no cause found. Continues walker use.  Will refer to outpt PT with water PT.       Relevant Orders   Ambulatory referral to Physical Therapy   Urge incontinence    Saw urologist who rec continue myrbetriq despite ongoing urinary incontinence - unable to afford this. Will need to stop after samples run out. No good alternatives. Oxybutynin previously stopped after fall.       Other Visit Diagnoses    Need for influenza vaccination       Relevant Orders   Flu Vaccine QUAD 36+ mos PF IM (Fluarix & Fluzone Quad PF) (Completed)       Follow up plan: Return in about 4 weeks (around 06/13/2016) for follow up visit.  Ria Bush, MD

## 2016-05-16 NOTE — Patient Instructions (Addendum)
Flu shot today. Let's stop coumadin. Start aspirin 325mg  daily.  We will refer you for outpatient PT and water PT as well.  Good to see you today, call us with questions. Return in 1 month for follow up visit.

## 2016-05-16 NOTE — Assessment & Plan Note (Addendum)
Again reviewed pros/cons of anticoagulation with coumadin vs full dose aspirin (bleeding risk in setting of recurrent falls vs stroke/thrombosis in h/o elevated factor 8 levels). Will stop coumadin, start full dose aspirin.

## 2016-05-16 NOTE — Assessment & Plan Note (Signed)
Saw urologist who rec continue myrbetriq despite ongoing urinary incontinence - unable to afford this. Will need to stop after samples run out. No good alternatives. Oxybutynin previously stopped after fall.

## 2016-05-16 NOTE — Assessment & Plan Note (Signed)
Appreciate neuro eval.

## 2016-05-16 NOTE — Assessment & Plan Note (Signed)
Recent cervical MRI with no cause found. Continues walker use.  Will refer to outpt PT with water PT.

## 2016-05-16 NOTE — Assessment & Plan Note (Signed)
Again reviewed pros/cons of anticoagulation with coumadin vs full dose aspirin (bleeding risk in setting of recurrent falls vs stroke/thrombosis in elevated factor 8 levels). Will stop coumadin, start full dose aspirin.

## 2016-05-19 DIAGNOSIS — Z8781 Personal history of (healed) traumatic fracture: Secondary | ICD-10-CM | POA: Diagnosis not present

## 2016-05-19 DIAGNOSIS — Z9181 History of falling: Secondary | ICD-10-CM | POA: Diagnosis not present

## 2016-05-19 DIAGNOSIS — S72002D Fracture of unspecified part of neck of left femur, subsequent encounter for closed fracture with routine healing: Secondary | ICD-10-CM | POA: Diagnosis not present

## 2016-05-19 DIAGNOSIS — I129 Hypertensive chronic kidney disease with stage 1 through stage 4 chronic kidney disease, or unspecified chronic kidney disease: Secondary | ICD-10-CM | POA: Diagnosis not present

## 2016-05-19 DIAGNOSIS — Z8673 Personal history of transient ischemic attack (TIA), and cerebral infarction without residual deficits: Secondary | ICD-10-CM | POA: Diagnosis not present

## 2016-05-19 DIAGNOSIS — N183 Chronic kidney disease, stage 3 (moderate): Secondary | ICD-10-CM | POA: Diagnosis not present

## 2016-05-20 ENCOUNTER — Telehealth: Payer: Self-pay

## 2016-05-20 NOTE — Telephone Encounter (Signed)
pts wife left v/m requesting to add water therapy to PT order for pt. Mrs Cubero request cb.

## 2016-05-20 NOTE — Telephone Encounter (Signed)
referral placed 05/16/2016.

## 2016-05-26 DIAGNOSIS — Z9181 History of falling: Secondary | ICD-10-CM | POA: Diagnosis not present

## 2016-05-26 DIAGNOSIS — S72002D Fracture of unspecified part of neck of left femur, subsequent encounter for closed fracture with routine healing: Secondary | ICD-10-CM | POA: Diagnosis not present

## 2016-05-26 DIAGNOSIS — Z8673 Personal history of transient ischemic attack (TIA), and cerebral infarction without residual deficits: Secondary | ICD-10-CM | POA: Diagnosis not present

## 2016-05-26 DIAGNOSIS — N183 Chronic kidney disease, stage 3 (moderate): Secondary | ICD-10-CM | POA: Diagnosis not present

## 2016-05-26 DIAGNOSIS — I129 Hypertensive chronic kidney disease with stage 1 through stage 4 chronic kidney disease, or unspecified chronic kidney disease: Secondary | ICD-10-CM | POA: Diagnosis not present

## 2016-05-26 DIAGNOSIS — Z8781 Personal history of (healed) traumatic fracture: Secondary | ICD-10-CM | POA: Diagnosis not present

## 2016-05-30 ENCOUNTER — Emergency Department (HOSPITAL_COMMUNITY): Payer: Medicare Other

## 2016-05-30 ENCOUNTER — Emergency Department (HOSPITAL_COMMUNITY)
Admission: EM | Admit: 2016-05-30 | Discharge: 2016-05-30 | Disposition: A | Discharge status: Home / Self Care | Payer: Medicare Other | Attending: Emergency Medicine | Admitting: Emergency Medicine

## 2016-05-30 ENCOUNTER — Encounter (HOSPITAL_COMMUNITY): Payer: Self-pay | Admitting: Emergency Medicine

## 2016-05-30 DIAGNOSIS — J45909 Unspecified asthma, uncomplicated: Secondary | ICD-10-CM | POA: Insufficient documentation

## 2016-05-30 DIAGNOSIS — M25552 Pain in left hip: Secondary | ICD-10-CM

## 2016-05-30 DIAGNOSIS — I509 Heart failure, unspecified: Secondary | ICD-10-CM | POA: Insufficient documentation

## 2016-05-30 DIAGNOSIS — Z8673 Personal history of transient ischemic attack (TIA), and cerebral infarction without residual deficits: Secondary | ICD-10-CM | POA: Insufficient documentation

## 2016-05-30 DIAGNOSIS — Z7982 Long term (current) use of aspirin: Secondary | ICD-10-CM | POA: Diagnosis not present

## 2016-05-30 DIAGNOSIS — I2510 Atherosclerotic heart disease of native coronary artery without angina pectoris: Secondary | ICD-10-CM | POA: Diagnosis not present

## 2016-05-30 DIAGNOSIS — N183 Chronic kidney disease, stage 3 (moderate): Secondary | ICD-10-CM | POA: Diagnosis not present

## 2016-05-30 DIAGNOSIS — Z955 Presence of coronary angioplasty implant and graft: Secondary | ICD-10-CM | POA: Diagnosis not present

## 2016-05-30 DIAGNOSIS — M25559 Pain in unspecified hip: Secondary | ICD-10-CM

## 2016-05-30 MED ORDER — HYDROCODONE-ACETAMINOPHEN 5-325 MG PO TABS
1.0000 | ORAL_TABLET | Freq: Once | ORAL | Status: DC
  Filled 2016-05-30: qty 1

## 2016-05-30 NOTE — ED Notes (Signed)
Paged [place] [facility]/NORR[facility]

## 2016-05-30 NOTE — Discharge Instructions (Signed)
Call ortho office Monday. For severe pain take norco or vicodin however realize they have the potential for addiction and it can make you sleepy and has tylenol in it.  No operating machinery while taking.

## 2016-05-30 NOTE — ED Provider Notes (Signed)
[facility] DEPT Provider Note   CSN: [id number] Arrival date & time: 05/30/16  1654     History   Chief Complaint Chief Complaint  Patient presents with  . Hip Pain    HPI [name] is a 82 y.o. male.  Patient presents with worsening left hip pain for the past 24 hours. Patient is approximately 11 weeks post screw placement left hip. Patient does have multiple medical issues however this is focal presentation. No fevers or chills. No new injury or fall. Patient has been doing rehabilitation and using his walker. Pain with ambulation.    Hip Pain  Pertinent negatives include no chest pain, no abdominal pain, no headaches and no shortness of breath.    Past Medical History:  Diagnosis Date  . Allergic rhinitis   . Arthritis   . Asthma    remote  . Blood transfusion 1990's  . CAD (coronary artery disease)    cath 2000 30% single vessel, normal nuclear stress test 12/03/2010, no evidence ischemia  . Candidal urethritis in male 06/15/2015  . CKD (chronic kidney disease) stage 3, GFR 30-59 ml/min    baseline Cr 1.7  . Closed C7 fracture ([place]) 11/06/2014  . DDD (degenerative disc disease) 03/2013   by CT, diffuse multilevel cervical and lumbar spondylosis  . Depression   . DISH (diffuse idiopathic skeletal hyperostosis) 03/2013   lumbar spine on xray  . Dizziness    multifactorial, s/p PT at [facility] 06/2014 with HEP  . Elevated PSA    previous-normalized (followed by Dr. Jeffie Pollock, rec no repeat unless urinary sxs)  . Essential tremor 09/27/2008   reviewed eval by Dr Jannifer Franklin in chart 2011   . GERD (gastroesophageal reflux disease)    h/o PUD  . Hearing loss 06/2012   eval - rec annual exam  . History of fracture of right hip   . History of phlebitis   . History of pyelonephritis 05/2014   hospitalization with sepsis  . HLD (hyperlipidemia)    hypertriglyceridemia  . Hx pulmonary embolism 08/1991   negative hypercoagulable panel 12/2014  . Macrocytic anemia 2014   stable B12/folate and periph smear 05/2013, again periph smear 2017 with mature cells, mild dacrocytosis  . Osteoporosis 11/2013   DEXA T score -2.7  . Prediabetes   . Schatzki's ring 02/2012   s/p dilation [name]  . Sleep apnea    no CPAP- sleep apnea "cleared up 15 years ago"  . Stroke ([place])   . Urge incontinence   . UTI (urinary tract infection) due to Enterococcus 05/13/2015    Patient Active Problem List   Diagnosis Date Noted  . Encounter for current long-term use of anticoagulants 04/20/2016  . Left displaced femoral neck fracture ([place]) 03/11/2016  . Dysuria 02/07/2016  . Acute ischemic stroke ([place]) 11/16/2015  . 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 Esophageal stenosis   . CHF (congestive heart failure) ([place]) 11/12/2015  . Recurrent UTI 05/13/2015  . Drug rash 04/09/2015  . Chest pain at rest 03/29/2015  . Pedal edema 02/07/2015  . CN (constipation) 01/15/2015  . Orthostatic hypotension 11/24/2014  . Syncope 11/07/2014  . Advanced care planning/counseling discussion 11/06/2014  . Daytime somnolence 11/06/2014  . Macrocytic anemia   . History of colonic polyps 03/27/2014  . Trigger finger, acquired 01/05/2014  . Osteoporosis 11/06/2013  . Dementia 06/08/2013  . Vertigo 01/28/2013  . Bilateral hearing loss 06/07/2012  . GERD (gastroesophageal reflux disease) 04/30/2012  . Dysphagia 12/30/2011  . HLD (  hyperlipidemia) 12/08/2011  . MDD (major depressive disorder), recurrent episode, moderate ([place])   . Medicare annual wellness visit, subsequent 06/09/2011  . UNSTEADY GAIT 04/16/2010  . Vitamin B12 deficiency 11/01/2008  . Vitamin D deficiency 11/01/2008  . Essential tremor 09/27/2008  . HSV 03/08/2007  . Thrombocytopenia ([place]) 03/08/2007  . Coronary atherosclerosis 03/08/2007  . CKD (chronic kidney disease) stage 3, GFR 30-59 ml/min 03/08/2007  . Urge incontinence 03/08/2007  . RENAL CALCULUS, HX OF 03/08/2007  . THROMBOPHLEBITIS 01/04/2007  . Personal history  of pulmonary embolism 09/08/1990    Past Surgical History:  Procedure Laterality Date  . APPENDECTOMY  1996  . CARDIAC CATHETERIZATION  2000   30% one vessel  . CARDIOVASCULAR STRESS TEST  03/2015   no ischemia, low risk, EF 76%  . CAROTID U/S  12/28/2007   nml  . CATARACT EXTRACTION  09/2009   bilateral  . COLONOSCOPY  1998   [place]  . COLONOSCOPY  07/2010   5 polyps, adenomatous, rec rpt 3 yrs  . COLONOSCOPY  04/2014   3 polyps, adenomatous, f/u open ended given age [name]  . CT ABD W & PELVIS WO CM  07/2001   Scarring of right lung, stable negative o/w  . CT ABD W & PELVIS WO CM  11/2000   ? stones, right LL scarring  . CYSTOSCOPY  1992   for kidney stones  . EEG  11/12/2009   nml  . ESOPHAGOGASTRODUODENOSCOPY  02/2012   dilation of schatzki's ring [name]  . ESOPHAGOGASTRODUODENOSCOPY N/A 11/15/2015   Procedure: ESOPHAGOGASTRODUODENOSCOPY (EGD);  Surgeon: Gatha Mayer, MD;  Location: [facility] ENDOSCOPY;  Service: Endoscopy;  Laterality: N/A;  . [place]   with incidental appendectomy  . HERNIA REPAIR  1979   Left  . HIP PINNING,CANNULATED Left 03/11/2016   Procedure: CANNULATED HIP PINNING;  Surgeon: Rod Can, MD;  Location: [facility];  Service: Orthopedics;  Laterality: Left;  . KNEE ARTHROSCOPY  03/24/02   Right (Dr. Mauri Pole)  . MRI  11/2009   Head, nml  . ORIF FEMORAL NECK FRACTURE W/ DHS Right 08/03/2002   Dr Mauri Pole  . TONSILLECTOMY  1965  . US ECHOCARDIOGRAPHY  12/28/2007   Mild aortic valve calcification EF 55%, basically nml  . V/Q SCAN  06/1999   negative       Home Medications    Prior to Admission medications   Medication Sig Start Date End Date Taking? Authorizing Provider  albuterol (PROVENTIL HFA;VENTOLIN HFA) 108 (90 Base) MCG/ACT inhaler Inhale 2 puffs into the lungs every 6 (six) hours as needed for wheezing or shortness of breath. 01/10/16   Ria Bush, MD  aspirin EC 325 MG tablet Take 1 tablet (325 mg total) by mouth  daily. 05/16/16   Ria Bush, MD  b complex vitamins tablet Take 1 tablet by mouth daily.    Historical Provider, MD  Biotin 2500 MCG CAPS Take 1 capsule by mouth daily.    Historical Provider, MD  Cholecalciferol (VITAMIN D) 2000 UNITS CAPS Take 1 capsule by mouth daily. Reported on 01/30/2016    Historical Provider, MD  clotrimazole (LOTRIMIN) 1 % cream Apply 1 application topically 2 (two) times daily. Patient taking differently: Apply 1 application topically 2 (two) times daily as needed. For rash 05/02/15   Pleas Koch, NP  Coenzyme Q10 (COQ-10) 100 MG CAPS Take 1 capsule by mouth daily.     Historical Provider, MD  Cyanocobalamin (B-12 PO) Take 10,000 mcg by mouth  every Monday, Wednesday, and Friday.     Historical Provider, MD  dexlansoprazole (DEXILANT) 60 MG capsule Take 1 capsule (60 mg total) by mouth daily. 01/10/16   Ria Bush, MD  docusate sodium (COLACE) 100 MG capsule Take 100 mg by mouth at bedtime.     Historical Provider, MD  fenofibrate (TRICOR) 145 MG tablet Take 1 tablet (145 mg total) by mouth daily. 01/10/16   Ria Bush, MD  fexofenadine (ALLEGRA) 180 MG tablet Take 180 mg by mouth daily.     Historical Provider, MD  gabapentin (NEURONTIN) 100 MG capsule Take 2 capsules (200 mg total) by mouth 3 (three) times daily. Patient taking differently: Take 300 mg by mouth 2 (two) times daily.  09/21/15   Kathrynn Ducking, MD  mirabegron ER (MYRBETRIQ) 25 MG TB24 tablet Take 25 mg by mouth daily.    Historical Provider, MD  Multiple Vitamins-Minerals (MULTIVITAMIN PO) Take 1 tablet by mouth daily.     Historical Provider, MD  simvastatin (ZOCOR) 40 MG tablet Take 1 tablet (40 mg total) by mouth daily. 01/10/16   Ria Bush, MD  venlafaxine (EFFEXOR) 75 MG tablet Take 1 tablet (75 mg total) by mouth 2 (two) times daily with a meal. 01/10/16   Ria Bush, MD    Family History Family History  Problem Relation Age of Onset  . Stroke Mother   . Hypertension  Mother   . Cancer Brother     prostate with mets  . Blindness Brother     legally  . Diabetes Brother   . Pulmonary embolism Sister     from shoulder operation  . Alcohol abuse Brother   . Colon cancer Neg Hx   . Esophageal cancer Neg Hx   . Rectal cancer Neg Hx   . Stomach cancer Neg Hx     Social History Social History  Substance Use Topics  . Smoking status: Never Smoker  . Smokeless tobacco: Never Used  . Alcohol use No     Allergies   Lactose intolerance (gi); Nizatidine; Sulfadiazine; Aricept [donepezil hcl]; Lipitor [atorvastatin]; and Penicillins   Review of Systems Review of Systems  Constitutional: Negative for chills and fever.  Respiratory: Negative for shortness of breath.   Cardiovascular: Negative for chest pain.  Gastrointestinal: Negative for abdominal pain and vomiting.  Genitourinary: Negative for dysuria and flank pain.  Musculoskeletal: Positive for gait problem. Negative for back pain, neck pain and neck stiffness.  Skin: Negative for rash.  Neurological: Negative for light-headedness and headaches.     Physical Exam Updated Vital Signs BP 122/59   Pulse 76   Temp 98.2 F (36.8 C) (Oral)   Resp 12   SpO2 99%   Physical Exam  Constitutional: He is oriented to person, place, and time. He appears well-developed and well-nourished.  HENT:  Head: Normocephalic and atraumatic.  Eyes: Right eye exhibits no discharge. Left eye exhibits no discharge.  Neck: No tracheal deviation present.  Cardiovascular: Normal rate.   Pulmonary/Chest: Effort normal.  Abdominal: Soft.  Musculoskeletal: He exhibits tenderness. He exhibits no edema.  Patient has tenderness with external/internal range of motion left hip. No warmth or swelling to the joint. No deformity. No focal tenderness to the left knee.  Neurological: He is alert and oriented to person, place, and time.  Skin: Skin is warm. No rash noted.  Nursing note and vitals reviewed.    ED  Treatments / Results  Labs (all labs ordered are listed, but only abnormal results  are displayed) Labs Reviewed - No data to display  EKG  EKG Interpretation None       Radiology Dg Hip Unilat With Pelvis 2-3 Views Left  Result Date: 05/30/2016 CLINICAL DATA:  Acute onset of worsening left hip pain. Prior left hip surgery in July. Initial encounter. EXAM: DG HIP (WITH OR WITHOUT PELVIS) 2-3V LEFT COMPARISON:  Left hip radiographs performed 03/10/2016 FINDINGS: There is no evidence of acute fracture or dislocation. Both femoral heads are seated normally within the respective acetabula. Bilateral hip screws are noted. Mild degenerative change is noted at the lower lumbar spine. Scattered vascular calcifications are seen. IMPRESSION: No evidence of fracture or dislocation. Scattered vascular calcifications seen. Electronically Signed   By: Garald Balding M.D.   On: 05/30/2016 18:42    Procedures Procedures (including critical care time)  Medications Ordered in ED Medications  HYDROcodone-acetaminophen (NORCO/VICODIN) 5-325 MG per tablet 1 tablet (1 tablet Oral Refused 05/30/16 2043)     Initial Impression / Assessment and Plan / ED Course  I have reviewed the triage vital signs and the nursing notes.  Pertinent labs & imaging results that were available during my care of the patient were reviewed by me and considered in my medical decision making (see chart for details).  Clinical Course   Patient presents with focal left hip tenderness without injury or sign of infection. X-ray showed no fracture screws in place. Discussed with orthopedics for outpatient follow.  Results and differential diagnosis were discussed with the patient/parent/guardian. Xrays were independently reviewed by myself.  Close follow up outpatient was discussed, comfortable with the plan.   Medications  HYDROcodone-acetaminophen (NORCO/VICODIN) 5-325 MG per tablet 1 tablet (1 tablet Oral Refused 05/30/16 2043)     Vitals:   05/30/16 2015 05/30/16 2030 05/30/16 2045 05/30/16 2100  BP: 108/62 122/60 124/83 122/59  Pulse: 78 77 76 76  Resp: 14 10 14 12   Temp:      TempSrc:      SpO2: 99% 98% 100% 99%    Final diagnoses:  Left hip pain     Final Clinical Impressions(s) / ED Diagnoses   Final diagnoses:  Left hip pain    New Prescriptions New Prescriptions   No medications on file     Elnora Morrison, MD 05/30/16 2125

## 2016-05-30 NOTE — ED Triage Notes (Signed)
Pt sts sx on left hip in July and now having increased pain today without new injury

## 2016-05-30 NOTE — ED Notes (Signed)
Pt stable, ambulatory, states understanding of discharge instructions 

## 2016-06-04 ENCOUNTER — Ambulatory Visit: Payer: Medicare Other | Attending: Family Medicine | Admitting: Physical Therapy

## 2016-06-04 DIAGNOSIS — M1712 Unilateral primary osteoarthritis, left knee: Secondary | ICD-10-CM | POA: Diagnosis not present

## 2016-06-04 DIAGNOSIS — R42 Dizziness and giddiness: Secondary | ICD-10-CM | POA: Diagnosis not present

## 2016-06-04 DIAGNOSIS — S72092D Other fracture of head and neck of left femur, subsequent encounter for closed fracture with routine healing: Secondary | ICD-10-CM | POA: Diagnosis not present

## 2016-06-04 DIAGNOSIS — R262 Difficulty in walking, not elsewhere classified: Secondary | ICD-10-CM | POA: Diagnosis not present

## 2016-06-05 ENCOUNTER — Ambulatory Visit: Payer: Medicare Other | Admitting: Physical Therapy

## 2016-06-05 NOTE — Therapy (Signed)
[facility] Dec 27, [address], [place], [address] Phone: [phone]   Fax:  [phone]  Physical Therapy Evaluation  Patient Details  Name: [name] MRN: [id number] Date of Birth: [birth year] Referring Provider: Ria Bush  Encounter Date: 06/04/2016      PT End of Session - 06/05/16 0943    Visit Number 1   Number of Visits 13   Date for PT Re-Evaluation 08/06/2016   Authorization Type g codes   PT Start Time 12/27/1028   PT Stop Time 1130   PT Time Calculation (min) 60 min   Behavior During Therapy Impulsive      Past Medical History:  Diagnosis Date  . Allergic rhinitis   . Arthritis   . Asthma    remote  . Blood transfusion 1990's  . CAD (coronary artery disease)    cath 1998-12-28 30% single vessel, normal nuclear stress test 12/03/2010, no evidence ischemia  . Candidal urethritis in male 06/15/2015  . CKD (chronic kidney disease) stage 3, GFR 30-59 ml/min    baseline Cr 1.7  . Closed C7 fracture ([place]) 11/06/2014  . DDD (degenerative disc disease) 03/2013   by CT, diffuse multilevel cervical and lumbar spondylosis  . Depression   . DISH (diffuse idiopathic skeletal hyperostosis) 03/2013   lumbar spine on xray  . Dizziness    multifactorial, s/p PT at [facility] 06/2014 with HEP  . Elevated PSA    previous-normalized (followed by Dr. Jeffie Pollock, rec no repeat unless urinary sxs)  . Essential tremor 09/27/2008   reviewed eval by Dr Jannifer Franklin in chart Dec 27, 2009   . GERD (gastroesophageal reflux disease)    h/o PUD  . Hearing loss 06/2012   eval - rec annual exam  . History of fracture of right hip   . History of phlebitis   . History of pyelonephritis 05/2014   hospitalization with sepsis  . HLD (hyperlipidemia)    hypertriglyceridemia  . Hx pulmonary embolism 08/1991   negative hypercoagulable panel December 28, 2014  . Macrocytic anemia 12/27/2012   stable B12/folate and periph smear 05/2013, again periph smear 2015-12-28 with  mature cells, mild dacrocytosis  . Osteoporosis 11/2013   DEXA T score -2.7  . Prediabetes   . Schatzki's ring 02/2012   s/p dilation [name]  . Sleep apnea    no CPAP- sleep apnea "cleared up 15 years ago"  . Stroke ([place])   . Urge incontinence   . UTI (urinary tract infection) due to Enterococcus 05/13/2015    Past Surgical History:  Procedure Laterality Date  . APPENDECTOMY  1996  . CARDIAC CATHETERIZATION  2000   30% one vessel  . CARDIOVASCULAR STRESS TEST  03/2015   no ischemia, low risk, EF 76%  . CAROTID U/S  12/28/2007   nml  . CATARACT EXTRACTION  09/2009   bilateral  . COLONOSCOPY  1998   [place]  . COLONOSCOPY  07/2010   5 polyps, adenomatous, rec rpt 3 yrs  . COLONOSCOPY  04/2014   3 polyps, adenomatous, f/u open ended given age [name]  . CT ABD W & PELVIS WO CM  07/2001   Scarring of right lung, stable negative o/w  . CT ABD W & PELVIS WO CM  11/2000   ? stones, right LL scarring  . CYSTOSCOPY  1992   for kidney stones  . EEG  11/12/2009   nml  . ESOPHAGOGASTRODUODENOSCOPY  02/2012   dilation of schatzki's ring [name]  .  ESOPHAGOGASTRODUODENOSCOPY N/A 11/15/2015   Procedure: ESOPHAGOGASTRODUODENOSCOPY (EGD);  Surgeon: Gatha Mayer, MD;  Location: [facility] ENDOSCOPY;  Service: Endoscopy;  Laterality: N/A;  . [place]   with incidental appendectomy  . HERNIA REPAIR  1979   Left  . HIP PINNING,CANNULATED Left 03/11/2016   Procedure: CANNULATED HIP PINNING;  Surgeon: Rod Can, MD;  Location: [facility];  Service: Orthopedics;  Laterality: Left;  . KNEE ARTHROSCOPY  03/24/02   Right (Dr. Mauri Pole)  . MRI  11/2009   Head, nml  . ORIF FEMORAL NECK FRACTURE W/ DHS Right 08/03/2002   Dr Mauri Pole  . TONSILLECTOMY  1965  . US ECHOCARDIOGRAPHY  12/28/2007   Mild aortic valve calcification EF 55%, basically nml  . V/Q SCAN  06/1999   negative    There were no vitals filed for this visit.       Subjective Assessment - 06/04/16 1040     Subjective Pt has extensive history of falls, including a recent fall 03/09/16 and surgery to repair L hip fracture 7/4. Following this pt had 1 month of rehab, followed by 1 month of home health PT.   Patient is accompained by: Family member   Pertinent History Pt ambulates with RW, rollator, or w/c. He only uses w/c when he is going for appointments.Pt has a severe benign tremor per pt's wife, who reports that his neurologist believes the tremor is the cause of his frequent falls. He is taking gabapentin for this which does not appear to be helping. Pt has had multiple CVAs in the past prior to onset of incr. falls. Pt lives with wife who is responsible for daily care.   Diagnostic tests imaging pre and post surgery and with recent trip to ED when he was concerned that he had injured his surgery.   Patient Stated Goals get stronger and walk better   Currently in Pain? No/denies                               PT Education - 06/05/16 [phone]    Education provided Yes   Education Details educated pt on aquatic therapy, the need for more focused balance work potentially at the main clinic where there is additional equipment to help with this.   Person(s) Educated Patient;Spouse   Methods Explanation   Comprehension Verbalized understanding             PT Long Term Goals - 06/04/16 0951      PT LONG TERM GOAL #1   Title Pt will be able to perform sit<>stand safely with no vebal cuing required to decr. risk of falls   Baseline pt requires verbal cuing to avoid pulling up with rollator   Time 6   Period Weeks   Status New     PT LONG TERM GOAL #2   Title Pt will improve TUG to less than 11 sec. indicating decr. risk for falls   Time 6   Period Weeks   Status New     PT LONG TERM GOAL #3   Title Pt will participate in further balance assessment   Time 6   Period Weeks   Status New               Plan - 06/04/16 0944    Clinical Impression Statement Pt  is a pleasant 82 y/o male seen with wife for frequent falls/gait. Pt has history of multiple strokes,  multiple falls including at least 4 in the past 6 months, including one with femoral neck fx and repair. He has had multiple rounds of PT including HHPT and inpatient rehab for fall risk and is now seen in outpatient. Currently pt presents with normal strength, poor safety awareness, requires rollator for gait and uses rollator improperly, and demonstrates generally poor motor control/balance. Pt would benefit from skilled PT to address these deficits and reduce risk for falls.   Rehab Potential Fair   Clinical Impairments Affecting Rehab Potential medical comorbidities/family support   PT Frequency 2x / week   PT Duration 6 weeks   PT Treatment/Interventions ADLs/Self Care Home Management;Aquatic Therapy;Gait training;Therapeutic activities;Therapeutic exercise;Functional mobility training;Stair training;Balance training;Neuromuscular re-education;Manual techniques;Patient/family education;Passive range of motion;Vestibular   PT Next Visit Plan Pt will be seen for aquatic therapy session, will need further balance testing   Recommended Other Services None - pt has had multiple rounds of OT   Consulted and Agree with Plan of Care Patient;Family member/caregiver      Patient will benefit from skilled therapeutic intervention in order to improve the following deficits and impairments:  Improper body mechanics, Decreased balance, Decreased safety awareness, Difficulty walking, Decreased activity tolerance, Decreased coordination, Abnormal gait, Decreased cognition  Visit Diagnosis: Difficulty in walking, not elsewhere classified - Plan: PT plan of care cert/re-cert  Dizziness and giddiness - Plan: PT plan of care cert/re-cert      G-Codes - [id number]    Functional Assessment Tool Used TUG, 5x sit<>stand,    Functional Limitation Mobility: Walking and moving around   Mobility: Walking and  Moving Around Current Status [id number]) At least 60 percent but less than 80 percent impaired, limited or restricted   Mobility: Walking and Moving Around Goal Status [phone]) At least 20 percent but less than 40 percent impaired, limited or restricted       Problem List Patient Active Problem List   Diagnosis Date Noted  . Encounter for current long-term use of anticoagulants 04/20/2016  . Left displaced femoral neck fracture ([place]) 03/11/2016  . Dysuria 02/07/2016  . Acute ischemic stroke ([place]) 11/16/2015  . 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 Esophageal stenosis   . CHF (congestive heart failure) ([place]) 11/12/2015  . Recurrent UTI 05/13/2015  . Drug rash 04/09/2015  . Chest pain at rest 03/29/2015  . Pedal edema 02/07/2015  . CN (constipation) 01/15/2015  . Orthostatic hypotension 11/24/2014  . Syncope 11/07/2014  . Advanced care planning/counseling discussion 11/06/2014  . Daytime somnolence 11/06/2014  . Macrocytic anemia   . History of colonic polyps 03/27/2014  . Trigger finger, acquired 01/05/2014  . Osteoporosis 11/06/2013  . Dementia 06/08/2013  . Vertigo 01/28/2013  . Bilateral hearing loss 06/07/2012  . GERD (gastroesophageal reflux disease) 04/30/2012  . Dysphagia 12/30/2011  . HLD (hyperlipidemia) 12/08/2011  . MDD (major depressive disorder), recurrent episode, moderate ([place])   . Medicare annual wellness visit, subsequent 06/09/2011  . UNSTEADY GAIT 04/16/2010  . Vitamin B12 deficiency 11/01/2008  . Vitamin D deficiency 11/01/2008  . Essential tremor 09/27/2008  . HSV 03/08/2007  . Thrombocytopenia ([place]) 03/08/2007  . Coronary atherosclerosis 03/08/2007  . CKD (chronic kidney disease) stage 3, GFR 30-59 ml/min 03/08/2007  . Urge incontinence 03/08/2007  . RENAL CALCULUS, HX OF 03/08/2007  . THROMBOPHLEBITIS 01/04/2007  . Personal history of pulmonary embolism 09/08/1990    Fisher,Benjamin PT DPT 06/05/2016, 9:58 AM  [facility] [address],  [place], [address] Phone: [phone]   Fax:  [phone]  Name: [name] MRN: [id number] Date of Birth: [birth year]

## 2016-06-09 ENCOUNTER — Encounter: Payer: Self-pay | Admitting: Physical Therapy

## 2016-06-09 ENCOUNTER — Ambulatory Visit: Payer: Medicare Other | Attending: Family Medicine | Admitting: Physical Therapy

## 2016-06-09 DIAGNOSIS — R42 Dizziness and giddiness: Secondary | ICD-10-CM | POA: Diagnosis not present

## 2016-06-09 DIAGNOSIS — R262 Difficulty in walking, not elsewhere classified: Secondary | ICD-10-CM | POA: Diagnosis not present

## 2016-06-09 NOTE — Therapy (Signed)
[place] MAIN [facility] SERVICES [address] [place], [place], [address] Phone: [phone]   Fax:  [phone]  Physical Therapy Treatment  Patient Details  Name: [name] MRN: [id number] Date of Birth: April 19, 1931 Referring Provider: Ria Bush  Encounter Date: 06/09/2016      PT End of Session - 06/09/16 1611    Visit Number 2   Number of Visits 13   Date for PT Re-Evaluation Aug 05, 2016   Authorization Type g codes   Authorization Time Period 2/10   PT Start Time 1515   PT Stop Time 1600   PT Time Calculation (min) 45 min   Equipment Utilized During Treatment Gait belt   Activity Tolerance Patient tolerated treatment well;No increased pain   Behavior During Therapy WFL for tasks assessed/performed      Past Medical History:  Diagnosis Date  . Allergic rhinitis   . Arthritis   . Asthma    remote  . Blood transfusion 1990's  . CAD (coronary artery disease)    cath 2000 30% single vessel, normal nuclear stress test 12/03/2010, no evidence ischemia  . Candidal urethritis in male 06/15/2015  . CKD (chronic kidney disease) stage 3, GFR 30-59 ml/min    baseline Cr 1.7  . Closed C7 fracture ([place]) 11/06/2014  . DDD (degenerative disc disease) 03/2013   by CT, diffuse multilevel cervical and lumbar spondylosis  . Depression   . DISH (diffuse idiopathic skeletal hyperostosis) 03/2013   lumbar spine on xray  . Dizziness    multifactorial, s/p PT at [facility] 06/2014 with HEP  . Elevated PSA    previous-normalized (followed by Dr. Jeffie Pollock, rec no repeat unless urinary sxs)  . Essential tremor 09/27/2008   reviewed eval by Dr Jannifer Franklin in chart 2011   . GERD (gastroesophageal reflux disease)    h/o PUD  . Hearing loss 06/2012   eval - rec annual exam  . History of fracture of right hip   . History of phlebitis   . History of pyelonephritis 05/2014   hospitalization with sepsis  . HLD (hyperlipidemia)    hypertriglyceridemia  . Hx  pulmonary embolism 08/1991   negative hypercoagulable panel 12/2014  . Macrocytic anemia 2014   stable B12/folate and periph smear 05/2013, again periph smear 2017 with mature cells, mild dacrocytosis  . Osteoporosis 11/2013   DEXA T score -2.7  . Prediabetes   . Schatzki's ring 02/2012   s/p dilation [name]  . Sleep apnea    no CPAP- sleep apnea "cleared up 15 years ago"  . Stroke ([place])   . Urge incontinence   . UTI (urinary tract infection) due to Enterococcus 05/13/2015    Past Surgical History:  Procedure Laterality Date  . APPENDECTOMY  1996  . CARDIAC CATHETERIZATION  2000   30% one vessel  . CARDIOVASCULAR STRESS TEST  03/2015   no ischemia, low risk, EF 76%  . CAROTID U/S  12/28/2007   nml  . CATARACT EXTRACTION  09/2009   bilateral  . COLONOSCOPY  1998   [place]  . COLONOSCOPY  07/2010   5 polyps, adenomatous, rec rpt 3 yrs  . COLONOSCOPY  04/2014   3 polyps, adenomatous, f/u open ended given age [name]  . CT ABD W & PELVIS WO CM  07/2001   Scarring of right lung, stable negative o/w  . CT ABD W & PELVIS WO CM  11/2000   ? stones, right LL scarring  . CYSTOSCOPY  1992  for kidney stones  . EEG  11/12/2009   nml  . ESOPHAGOGASTRODUODENOSCOPY  02/2012   dilation of schatzki's ring [name]  . ESOPHAGOGASTRODUODENOSCOPY N/A 11/15/2015   Procedure: ESOPHAGOGASTRODUODENOSCOPY (EGD);  Surgeon: Gatha Mayer, MD;  Location: [facility] ENDOSCOPY;  Service: Endoscopy;  Laterality: N/A;  . [place]   with incidental appendectomy  . HERNIA REPAIR  1979   Left  . HIP PINNING,CANNULATED Left 03/11/2016   Procedure: CANNULATED HIP PINNING;  Surgeon: Rod Can, MD;  Location: [place];  Service: Orthopedics;  Laterality: Left;  . KNEE ARTHROSCOPY  03/24/02   Right (Dr. Mauri Pole)  . MRI  11/2009   Head, nml  . ORIF FEMORAL NECK FRACTURE W/ DHS Right 08/03/2002   Dr Mauri Pole  . TONSILLECTOMY  1965  . US ECHOCARDIOGRAPHY  12/28/2007   Mild aortic valve  calcification EF 55%, basically nml  . V/Q SCAN  06/1999   negative    There were no vitals filed for this visit.      Subjective Assessment - 06/09/16 1512    Subjective Pt reports that his hip is sore.  He has gone through inpatient rehab and home health before outpatient.     Patient is accompained by: Family member   Pertinent History Pt ambulates with RW, rollator, or w/c. He only uses w/c when he is going for appointments.Pt has a severe benign tremor per pt's wife, who reports that his neurologist believes the tremor is the cause of his frequent falls. He is taking gabapentin for this which does not appear to be helping. Pt has had multiple CVAs in the past prior to onset of incr. falls. Pt lives with wife who is responsible for daily care.   Diagnostic tests imaging pre and post surgery and with recent trip to ED when he was concerned that he had injured his surgery.   Patient Stated Goals get stronger and walk better   Currently in Pain? Yes   Pain Score 3    Pain Location Hip   Pain Orientation Left   Pain Descriptors / Indicators Aching      Treatment Nustep x 4 mins BUEs/BLEs, level 1 (unbilled)  4 way hip x 10 reps each leg, hip flexion, extension, abduction, min VCs to keep leg straight, and squeeze gluts, pt required seated rest break between each  Toe taps on 4 inch step, 2 sets x 10 reps, 2 HHA, min VCs for upright posture, and forward gaze throughout  Heel raises, 2 sets x 10 reps,  2 HHA min VCs to increase dorsiflexion  Step taps towards stepping stones, 2 HHA, CGA for stability, 2 sets x 10 reps  Sidestepping along airex balance beam x 4 laps, 2 HHA for stability, min VCs for upright posture and increased step length  Oblique twist and overhead press with ball standing on purple airex pad, 2 sets x 10 reps, min VCs for increased core activation for upright posture                            PT Education - 06/09/16 1610    Education provided  Yes   Education Details short bouts of walking for endurance    Person(s) Educated Patient   Methods Explanation;Demonstration;Verbal cues   Comprehension Returned demonstration;Verbalized understanding;Verbal cues required             PT Long Term Goals - 06/04/16 0951      PT  LONG TERM GOAL #1   Title Pt will be able to perform sit<>stand safely with no vebal cuing required to decr. risk of falls   Baseline pt requires verbal cuing to avoid pulling up with rollator   Time 6   Period Weeks   Status New     PT LONG TERM GOAL #2   Title Pt will improve TUG to less than 11 sec. indicating decr. risk for falls   Time 6   Period Weeks   Status New     PT LONG TERM GOAL #3   Title Pt will participate in further balance assessment   Time 6   Period Weeks   Status New               Plan - 06/09/16 1612    Clinical Impression Statement Pt presents today with his wife.  Initated standing strengthening activites to promote further single leg weight bearing.  Pt requires frequent seated rest breaks for fatigue.  Standing balance was initated  with 2 HHA due to resting tremors and poor dynamic balance.  Pt required min VCs for upright posture and greater step length throughout balance activites.  He was educated on frequent and short bouts of walking to improve endurance and reduce stiffness. Pt reported no increase in L hip pain after session.   He would benefit from further skilled PT to increase LE strength, balance and gait for more functional mobility.     Rehab Potential Fair   Clinical Impairments Affecting Rehab Potential medical comorbidities/family support   PT Frequency 2x / week   PT Duration 6 weeks   PT Treatment/Interventions ADLs/Self Care Home Management;Aquatic Therapy;Gait training;Therapeutic activities;Therapeutic exercise;Functional mobility training;Stair training;Balance training;Neuromuscular re-education;Manual techniques;Patient/family education;Passive  range of motion;Vestibular   PT Next Visit Plan BERG, static standing balance    Consulted and Agree with Plan of Care Patient;Family member/caregiver      Patient will benefit from skilled therapeutic intervention in order to improve the following deficits and impairments:  Improper body mechanics, Decreased balance, Decreased safety awareness, Difficulty walking, Decreased activity tolerance, Decreased coordination, Abnormal gait, Decreased cognition  Visit Diagnosis: Difficulty in walking, not elsewhere classified     Problem List Patient Active Problem List   Diagnosis Date Noted  . Encounter for current long-term use of anticoagulants 04/20/2016  . Left displaced femoral neck fracture ([place]) 03/11/2016  . Dysuria 02/07/2016  . Acute ischemic stroke ([place]) 11/16/2015  . 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 Esophageal stenosis   . CHF (congestive heart failure) ([place]) 11/12/2015  . Recurrent UTI 05/13/2015  . Drug rash 04/09/2015  . Chest pain at rest 03/29/2015  . Pedal edema 02/07/2015  . CN (constipation) 01/15/2015  . Orthostatic hypotension 11/24/2014  . Syncope 11/07/2014  . Advanced care planning/counseling discussion 11/06/2014  . Daytime somnolence 11/06/2014  . Macrocytic anemia   . History of colonic polyps 03/27/2014  . Trigger finger, acquired 01/05/2014  . Osteoporosis 11/06/2013  . Dementia 06/08/2013  . Vertigo 01/28/2013  . Bilateral hearing loss 06/07/2012  . GERD (gastroesophageal reflux disease) 04/30/2012  . Dysphagia 12/30/2011  . HLD (hyperlipidemia) 12/08/2011  . MDD (major depressive disorder), recurrent episode, moderate ([place])   . Medicare annual wellness visit, subsequent 06/09/2011  . UNSTEADY GAIT 04/16/2010  . Vitamin B12 deficiency 11/01/2008  . Vitamin D deficiency 11/01/2008  . Essential tremor 09/27/2008  . HSV 03/08/2007  . Thrombocytopenia ([place]) 03/08/2007  . Coronary atherosclerosis 03/08/2007  . CKD (chronic kidney disease)  stage 3, GFR 30-59 ml/min 03/08/2007  . Urge incontinence 03/08/2007  . RENAL CALCULUS, HX OF 03/08/2007  . THROMBOPHLEBITIS 01/04/2007  . Personal history of pulmonary embolism 09/08/1990   Stacy Gardner, SPT  This entire session was performed under direct supervision and direction of a licensed therapist/therapist assistant . I have personally read, edited and approve of the note as written.  Trotter,Margaret PT, DPT 06/10/2016, 9:05 AM  [place] MAIN [facility] SERVICES [address] [place], [place], [address] Phone: [phone]   Fax:  [phone]  Name: [name] MRN: [id number] Date of Birth: 06-14-1931

## 2016-06-11 ENCOUNTER — Ambulatory Visit (INDEPENDENT_AMBULATORY_CARE_PROVIDER_SITE_OTHER): Payer: Medicare Other | Admitting: Family Medicine

## 2016-06-11 ENCOUNTER — Encounter: Payer: Medicare Other | Admitting: Physical Therapy

## 2016-06-11 ENCOUNTER — Encounter: Payer: Self-pay | Admitting: Physical Therapy

## 2016-06-11 ENCOUNTER — Encounter: Payer: Self-pay | Admitting: Family Medicine

## 2016-06-11 ENCOUNTER — Ambulatory Visit: Payer: Medicare Other | Admitting: Physical Therapy

## 2016-06-11 VITALS — BP 116/62 | HR 80 | Temp 98.2°F | Wt 170.2 lb

## 2016-06-11 DIAGNOSIS — R791 Abnormal coagulation profile: Secondary | ICD-10-CM

## 2016-06-11 DIAGNOSIS — R269 Unspecified abnormalities of gait and mobility: Secondary | ICD-10-CM

## 2016-06-11 DIAGNOSIS — S72002A Fracture of unspecified part of neck of left femur, initial encounter for closed fracture: Secondary | ICD-10-CM

## 2016-06-11 DIAGNOSIS — R262 Difficulty in walking, not elsewhere classified: Secondary | ICD-10-CM | POA: Diagnosis not present

## 2016-06-11 DIAGNOSIS — I639 Cerebral infarction, unspecified: Secondary | ICD-10-CM

## 2016-06-11 DIAGNOSIS — F0390 Unspecified dementia without behavioral disturbance: Secondary | ICD-10-CM

## 2016-06-11 DIAGNOSIS — G250 Essential tremor: Secondary | ICD-10-CM

## 2016-06-11 DIAGNOSIS — R42 Dizziness and giddiness: Secondary | ICD-10-CM | POA: Diagnosis not present

## 2016-06-11 NOTE — Patient Instructions (Signed)
Good to see you today. Return as needed or in 2-3 months for follow up visit

## 2016-06-11 NOTE — Progress Notes (Signed)
Pre visit review using our clinic review tool, if applicable. No additional management support is needed unless otherwise documented below in the visit note. 

## 2016-06-11 NOTE — Therapy (Signed)
[place] MAIN [facility] SERVICES [address] [place], [place], [address] Phone: [phone]   Fax:  [phone]  Physical Therapy Treatment  Patient Details  Name: [name] MRN: [id number] Date of Birth: 1931/07/18 Referring Provider: Ria Bush  Encounter Date: 06/11/2016      PT End of Session - 06/11/16 1711    Visit Number 3   Number of Visits 13   Date for PT Re-Evaluation 03-Aug-2016   Authorization Type g codes   Authorization Time Period 3/10   PT Start Time [id number]   PT Stop Time 1706   PT Time Calculation (min) 56 min   Equipment Utilized During Treatment Gait belt   Activity Tolerance Patient tolerated treatment well;No increased pain   Behavior During Therapy WFL for tasks assessed/performed      Past Medical History:  Diagnosis Date  . Allergic rhinitis   . Arthritis   . Asthma    remote  . Blood transfusion 1990's  . CAD (coronary artery disease)    cath 2000 30% single vessel, normal nuclear stress test 12/03/2010, no evidence ischemia  . Candidal urethritis in male 06/15/2015  . CKD (chronic kidney disease) stage 3, GFR 30-59 ml/min    baseline Cr 1.7  . Closed C7 fracture ([place]) 11/06/2014  . DDD (degenerative disc disease) 03/2013   by CT, diffuse multilevel cervical and lumbar spondylosis  . Depression   . DISH (diffuse idiopathic skeletal hyperostosis) 03/2013   lumbar spine on xray  . Dizziness    multifactorial, s/p PT at [facility] 06/2014 with HEP  . Elevated PSA    previous-normalized (followed by Dr. Jeffie Pollock, rec no repeat unless urinary sxs)  . Essential tremor 09/27/2008   reviewed eval by Dr Jannifer Franklin in chart 2011   . GERD (gastroesophageal reflux disease)    h/o PUD  . Hearing loss 06/2012   eval - rec annual exam  . History of fracture of right hip   . History of phlebitis   . History of pyelonephritis 05/2014   hospitalization with sepsis  . HLD (hyperlipidemia)    hypertriglyceridemia  . Hx  pulmonary embolism 08/1991   negative hypercoagulable panel 12/2014  . Macrocytic anemia 2014   stable B12/folate and periph smear 05/2013, again periph smear 2017 with mature cells, mild dacrocytosis  . Osteoporosis 11/2013   DEXA T score -2.7  . Prediabetes   . Schatzki's ring 02/2012   s/p dilation [name]  . Sleep apnea    no CPAP- sleep apnea "cleared up 15 years ago"  . Stroke ([place])   . Urge incontinence   . UTI (urinary tract infection) due to Enterococcus 05/13/2015    Past Surgical History:  Procedure Laterality Date  . APPENDECTOMY  1996  . CARDIAC CATHETERIZATION  2000   30% one vessel  . CARDIOVASCULAR STRESS TEST  03/2015   no ischemia, low risk, EF 76%  . CAROTID U/S  12/28/2007   nml  . CATARACT EXTRACTION  09/2009   bilateral  . COLONOSCOPY  1998   [place]  . COLONOSCOPY  2010/08/03   5 polyps, adenomatous, rec rpt 3 yrs  . COLONOSCOPY  04/2014   3 polyps, adenomatous, f/u open ended given age [name]  . CT ABD W & PELVIS WO CM  Aug 03, 2001   Scarring of right lung, stable negative o/w  . CT ABD W & PELVIS WO CM  11/2000   ? stones, right LL scarring  . CYSTOSCOPY  1992  for kidney stones  . EEG  11/12/2009   nml  . ESOPHAGOGASTRODUODENOSCOPY  02/2012   dilation of schatzki's ring [name]  . ESOPHAGOGASTRODUODENOSCOPY N/A 11/15/2015   Procedure: ESOPHAGOGASTRODUODENOSCOPY (EGD);  Surgeon: Gatha Mayer, MD;  Location: [facility] ENDOSCOPY;  Service: Endoscopy;  Laterality: N/A;  . [place]   with incidental appendectomy  . HERNIA REPAIR  1979   Left  . HIP PINNING,CANNULATED Left 03/11/2016   Procedure: CANNULATED HIP PINNING;  Surgeon: Rod Can, MD;  Location: [place];  Service: Orthopedics;  Laterality: Left;  . KNEE ARTHROSCOPY  03/24/02   Right (Dr. Mauri Pole)  . MRI  11/2009   Head, nml  . ORIF FEMORAL NECK FRACTURE W/ DHS Right 08/03/2002   Dr Mauri Pole  . TONSILLECTOMY  1965  . US ECHOCARDIOGRAPHY  12/28/2007   Mild aortic valve  calcification EF 55%, basically nml  . V/Q SCAN  06/1999   negative    There were no vitals filed for this visit.      Subjective Assessment - 06/11/16 1616    Subjective Pt reports L hip pain today that is about 3/10 this morning that hurts when he is walking but right now sitting 0/10.   Patient is accompained by: Family member   Pertinent History Pt ambulates with RW, rollator, or w/c. He only uses w/c when he is going for appointments.Pt has a severe benign tremor per pt's wife, who reports that his neurologist believes the tremor is the cause of his frequent falls. He is taking gabapentin for this which does not appear to be helping. Pt has had multiple CVAs in the past prior to onset of incr. falls. Pt lives with wife who is responsible for daily care.   Diagnostic tests imaging pre and post surgery and with recent trip to ED when he was concerned that he had injured his surgery.   Patient Stated Goals get stronger and walk better   Currently in Pain? No/denies        Educated pt and wife on home TENS unit regarding placement, desired result, time and intensity, wife was able to verbalize proper placement around L hip and knows how to gradually increase intensity, informed wife that she could increase distance between pads for less intensity (attended TENs10 mins)  Manual stretching of BLEs hip flexion, ER, IR, hamstrings and dorsiflexors for increased mobility before thereaputic ex. X 5 mins  Lower trunk rotations, 2 sets x 10 reps, min VCs to keep knees together while performing exercise  Hip flexion marches in supine, 2 sets x 10 reps alternating LEs, min VCs to increase hip flexion and increase core stability Hip abduction with red theraband resistance in supine, 2 sets x 10 reps, min VCs to increase hip abduction but not into pain   Hip adduction with green ball in hooklying, 2 sets x 10 reps with 3 second isometric hold, min VCs for increased isometric hold Leg Press, BLEs,  #100, 2 sets x 10 reps, min VCs for increased eccentric control  Leg Press, single L leg, #60, 1 set x 10 reps, min VCs for terminal knee extension  Step taps towards stepping stones alternating LEs, 2 bouts x 10 reps, 2 HHA, attempted 1 HHA but pt had LOB self corrected by PT  Step taps on bosu ball, 2 sets x 10 reps, min VCs for upright posture and increased glut activation, 2 HHA, CGA from therapist  PT Education - 06/11/16 1710    Education provided Yes   Education Details home TENS unit settings, sit to stand techqniue    Person(s) Educated Patient;Spouse   Methods Explanation;Demonstration;Verbal cues   Comprehension Verbalized understanding;Returned demonstration;Verbal cues required             PT Long Term Goals - 06/04/16 0951      PT LONG TERM GOAL #1   Title Pt will be able to perform sit<>stand safely with no vebal cuing required to decr. risk of falls   Baseline pt requires verbal cuing to avoid pulling up with rollator   Time 6   Period Weeks   Status New     PT LONG TERM GOAL #2   Title Pt will improve TUG to less than 11 sec. indicating decr. risk for falls   Time 6   Period Weeks   Status New     PT LONG TERM GOAL #3   Title Pt will participate in further balance assessment   Time 6   Period Weeks   Status New               Plan - 06/11/16 1712    Clinical Impression Statement Pt presents today with reports of 3/10 L hip pain earlier today while walking and now that he "feels" his hip but was not painful right now.  Assessed L hip mobility during stretching, pt has little to no L hip IR and painful and limited L hip ER.  Pt reported decreased stiffness after manual stretching to BLEs in supine.  Initiated supine and leg press for LE strength, pt required min VCs for increased eccentric control and greater knee extension on leg press.  Pt required 2 HHA for stepping tasks, when attempting 1 HHA pt had  LOB that was corrected by PT.  Educated pt and wife on home TENs unit so that it does not cause pain.  Pt would benefit from further skillled PT to increase LE strength, balance and gait for more functional mobility.     Rehab Potential Fair   Clinical Impairments Affecting Rehab Potential medical comorbidities/family support   PT Frequency 2x / week   PT Duration 6 weeks   PT Treatment/Interventions ADLs/Self Care Home Management;Aquatic Therapy;Gait training;Therapeutic activities;Therapeutic exercise;Functional mobility training;Stair training;Balance training;Neuromuscular re-education;Manual techniques;Patient/family education;Passive range of motion;Vestibular   PT Next Visit Plan BERG, static standing balance    Consulted and Agree with Plan of Care Patient;Family member/caregiver      Patient will benefit from skilled therapeutic intervention in order to improve the following deficits and impairments:  Improper body mechanics, Decreased balance, Difficulty walking, Decreased activity tolerance, Decreased coordination, Abnormal gait, Decreased cognition  Visit Diagnosis: Difficulty in walking, not elsewhere classified     Problem List Patient Active Problem List   Diagnosis Date Noted  . Encounter for current long-term use of anticoagulants 04/20/2016  . Left displaced femoral neck fracture ([place]) 03/11/2016  . Dysuria 02/07/2016  . Acute ischemic stroke ([place]) 11/16/2015  . 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 Esophageal stenosis   . CHF (congestive heart failure) ([place]) 11/12/2015  . Recurrent UTI 05/13/2015  . Drug rash 04/09/2015  . Chest pain at rest 03/29/2015  . Pedal edema 02/07/2015  . CN (constipation) 01/15/2015  . Orthostatic hypotension 11/24/2014  . Syncope 11/07/2014  . Advanced care planning/counseling discussion 11/06/2014  . Daytime somnolence 11/06/2014  . Macrocytic anemia   . History of colonic polyps 03/27/2014  . Trigger  finger, acquired  01/05/2014  . Osteoporosis 11/06/2013  . Dementia 06/08/2013  . Vertigo 01/28/2013  . Bilateral hearing loss 06/07/2012  . GERD (gastroesophageal reflux disease) 04/30/2012  . Dysphagia 12/30/2011  . HLD (hyperlipidemia) 12/08/2011  . MDD (major depressive disorder), recurrent episode, moderate ([place])   . Medicare annual wellness visit, subsequent 06/09/2011  . UNSTEADY GAIT 04/16/2010  . Vitamin B12 deficiency 11/01/2008  . Vitamin D deficiency 11/01/2008  . Essential tremor 09/27/2008  . HSV 03/08/2007  . Thrombocytopenia ([place]) 03/08/2007  . Coronary atherosclerosis 03/08/2007  . CKD (chronic kidney disease) stage 3, GFR 30-59 ml/min 03/08/2007  . Urge incontinence 03/08/2007  . RENAL CALCULUS, HX OF 03/08/2007  . THROMBOPHLEBITIS 01/04/2007  . Personal history of pulmonary embolism 09/08/1990   Stacy Gardner, SPT  This entire session was performed under direct supervision and direction of a licensed therapist/therapist assistant . I have personally read, edited and approve of the note as written.  Trotter,Margaret PT, DPT 06/12/2016, 8:59 AM   [place] MAIN [facility] SERVICES [address] [place], [place], [address] Phone: [phone]   Fax:  [phone]  Name: [name] MRN: [id number] Date of Birth: Jun 17, 1931
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
## 2016-06-12 ENCOUNTER — Encounter: Payer: Self-pay | Admitting: Family Medicine

## 2016-06-12 NOTE — Assessment & Plan Note (Signed)
Off coumadin, on full dose aspirin.

## 2016-06-12 NOTE — Assessment & Plan Note (Addendum)
Recovering well. Has started outpatient PT. Does not have balance to participate in water therapy.

## 2016-06-12 NOTE — Assessment & Plan Note (Signed)
S/p eval by neurology. Asks to fill out forms for [employer] for disability.

## 2016-06-12 NOTE — Assessment & Plan Note (Signed)
Stable period.  

## 2016-06-16 ENCOUNTER — Encounter: Payer: Self-pay | Admitting: Physical Therapy

## 2016-06-16 ENCOUNTER — Ambulatory Visit: Payer: Medicare Other | Admitting: Physical Therapy

## 2016-06-16 DIAGNOSIS — R262 Difficulty in walking, not elsewhere classified: Secondary | ICD-10-CM

## 2016-06-16 DIAGNOSIS — R42 Dizziness and giddiness: Secondary | ICD-10-CM

## 2016-06-16 NOTE — Therapy (Signed)
[place] MAIN [facility] SERVICES [address] [place], [place], [address] Phone: [phone]   Fax:  [phone]  Physical Therapy Treatment  Patient Details  Name: [name] MRN: [id number] Date of Birth: February 06, 1931 Referring Provider: Ria Bush  Encounter Date: 06/16/2016      PT End of Session - 06/16/16 1730    Visit Number 4   Number of Visits 13   Date for PT Re-Evaluation Aug 07, 2016   Authorization Type g codes   Authorization Time Period 4/10   PT Start Time [id number]   PT Stop Time 1730   PT Time Calculation (min) 45 min   Equipment Utilized During Treatment Gait belt   Activity Tolerance Patient tolerated treatment well;No increased pain   Behavior During Therapy WFL for tasks assessed/performed      Past Medical History:  Diagnosis Date  . Allergic rhinitis   . Arthritis   . Asthma    remote  . Blood transfusion 1990's  . CAD (coronary artery disease)    cath 2000 30% single vessel, normal nuclear stress test 12/03/2010, no evidence ischemia  . Candidal urethritis in male 06/15/2015  . CKD (chronic kidney disease) stage 3, GFR 30-59 ml/min    baseline Cr 1.7  . Closed C7 fracture ([place]) 11/06/2014  . DDD (degenerative disc disease) 03/2013   by CT, diffuse multilevel cervical and lumbar spondylosis  . Depression   . DISH (diffuse idiopathic skeletal hyperostosis) 03/2013   lumbar spine on xray  . Dizziness    multifactorial, s/p PT at [facility] 06/2014 with HEP  . Elevated PSA    previous-normalized (followed by Dr. Jeffie Pollock, rec no repeat unless urinary sxs)  . Essential tremor 09/27/2008   reviewed eval by Dr Jannifer Franklin in chart 2011   . GERD (gastroesophageal reflux disease)    h/o PUD  . Hearing loss 06/2012   eval - rec annual exam  . History of fracture of right hip   . History of phlebitis   . History of pyelonephritis 05/2014   hospitalization with sepsis  . HLD (hyperlipidemia)    hypertriglyceridemia  . Hx  pulmonary embolism 08/1991   negative hypercoagulable panel 12/2014  . Macrocytic anemia 2014   stable B12/folate and periph smear 05/2013, again periph smear 2017 with mature cells, mild dacrocytosis  . Osteoporosis 11/2013   DEXA T score -2.7  . Prediabetes   . Schatzki's ring 02/2012   s/p dilation [name]  . Sleep apnea    no CPAP- sleep apnea "cleared up 15 years ago"  . Stroke ([place])   . Urge incontinence   . UTI (urinary tract infection) due to Enterococcus 05/13/2015    Past Surgical History:  Procedure Laterality Date  . APPENDECTOMY  1996  . CARDIAC CATHETERIZATION  2000   30% one vessel  . CARDIOVASCULAR STRESS TEST  03/2015   no ischemia, low risk, EF 76%  . CAROTID U/S  12/28/2007   nml  . CATARACT EXTRACTION  09/2009   bilateral  . COLONOSCOPY  1998   [place] w[place]  . COLONOSCOPY  07/2010   5 polyps, adenomatous, rec rpt 3 yrs  . COLONOSCOPY  04/2014   3 polyps, adenomatous, f/u open ended given age [name]  . CT ABD W & PELVIS WO CM  07/2001   Scarring of right lung, stable negative o/w  . CT ABD W & PELVIS WO CM  11/2000   ? stones, right LL scarring  . CYSTOSCOPY  1992  for kidney stones  . EEG  11/12/2009   nml  . ESOPHAGOGASTRODUODENOSCOPY  02/2012   dilation of schatzki's ring [name]  . ESOPHAGOGASTRODUODENOSCOPY N/A 11/15/2015   Procedure: ESOPHAGOGASTRODUODENOSCOPY (EGD);  Surgeon: Gatha Mayer, MD;  Location: [facility] ENDOSCOPY;  Service: Endoscopy;  Laterality: N/A;  . [place]   with incidental appendectomy  . HERNIA REPAIR  1979   Left  . HIP PINNING,CANNULATED Left 03/11/2016   Procedure: CANNULATED HIP PINNING;  Surgeon: Rod Can, MD;  Location: [place];  Service: Orthopedics;  Laterality: Left;  . KNEE ARTHROSCOPY  03/24/02   Right (Dr. Mauri Pole)  . MRI  11/2009   Head, nml  . ORIF FEMORAL NECK FRACTURE W/ DHS Right 08/03/2002   Dr Mauri Pole  . TONSILLECTOMY  1965  . US ECHOCARDIOGRAPHY  12/28/2007   Mild aortic valve  calcification EF 55%, basically nml  . V/Q SCAN  06/1999   negative    There were no vitals filed for this visit.      Subjective Assessment - 06/16/16 1650    Subjective Pt reports L hip pain today at about 5/10 that is more achy than sharp.     Patient is accompained by: Family member   Pertinent History Pt ambulates with RW, rollator, or w/c. He only uses w/c when he is going for appointments.Pt has a severe benign tremor per pt's wife, who reports that his neurologist believes the tremor is the cause of his frequent falls. He is taking gabapentin for this which does not appear to be helping. Pt has had multiple CVAs in the past prior to onset of incr. falls. Pt lives with wife who is responsible for daily care.   Diagnostic tests imaging pre and post surgery and with recent trip to ED when he was concerned that he had injured his surgery.   Patient Stated Goals get stronger and walk better   Currently in Pain? Yes   Pain Score 5    Pain Location Hip   Pain Orientation Left   Pain Descriptors / Indicators Aching     Treatment  Nustep x 4 mins BUEs/BLEs level 1 (unbilled)  Sit to stands without UE support, 3 sets x 5 reps progressively lowering surface height, min VCs to increase knee extension and hip extension once standing  Seated hamstring curls with red theraband resistance, 2 sets x 10 reps each LE   Sidestepping with red theraband resistance x 6 laps, 2 HHA, CGA for safety, min VCs for forward facing hips and increased step length  Ball toss standing on purple airex pad with no UE assist x 15 reps, min VCs for upright posture and greater hip extension Leg Press, #100, BLEs, 2 sets x 10 reps, min VCs for increased eccentric control and decreased terminal knee extension                              PT Education - 06/16/16 1730    Education provided Yes   Education Details sit to stand technique    Person(s) Educated Patient   Methods  Explanation;Demonstration;Verbal cues   Comprehension Verbalized understanding;Returned demonstration;Verbal cues required             PT Long Term Goals - 06/04/16 0951      PT LONG TERM GOAL #1   Title Pt will be able to perform sit<>stand safely with no vebal cuing required to decr. risk of falls  Baseline pt requires verbal cuing to avoid pulling up with rollator   Time 6   Period Weeks   Status New     PT LONG TERM GOAL #2   Title Pt will improve TUG to less than 11 sec. indicating decr. risk for falls   Time 6   Period Weeks   Status New     PT LONG TERM GOAL #3   Title Pt will participate in further balance assessment   Time 6   Period Weeks   Status New               Plan - 06/16/16 1731    Clinical Impression Statement Pt reports with 5/10 L hip aching pain.  Initiated sit to stand training to work towards standing without UEs, pt was able to stand from gray mat table at lowest position without use of UEs with min VCS for increased glut activation and nose over toes.  Initiated further hip and hamstring strengthening with hamstring curls seated and sidestepping with red theraband resistance.  Initiated standing balance on purple airex pad with ball toss, pt was able to perform without UE support but required min VCs for upright posture.  Pt was able to demonstrate increased strength on leg press at #100 with proper technique.  He would benefit from further skilled PT to increase LE strength, balance and gait for more independent mobility.     Rehab Potential Fair   Clinical Impairments Affecting Rehab Potential medical comorbidities/family support   PT Frequency 2x / week   PT Duration 6 weeks   PT Treatment/Interventions ADLs/Self Care Home Management;Aquatic Therapy;Gait training;Therapeutic activities;Therapeutic exercise;Functional mobility training;Stair training;Balance training;Neuromuscular re-education;Manual techniques;Patient/family education;Passive  range of motion;Vestibular   PT Next Visit Plan BERG, static standing balance, walking without walker    Consulted and Agree with Plan of Care Patient;Family member/caregiver      Patient will benefit from skilled therapeutic intervention in order to improve the following deficits and impairments:  Improper body mechanics, Decreased balance, Difficulty walking, Decreased activity tolerance, Decreased coordination, Abnormal gait, Decreased cognition  Visit Diagnosis: Difficulty in walking, not elsewhere classified  Dizziness and giddiness     Problem List Patient Active Problem List   Diagnosis Date Noted  . Encounter for current long-term use of anticoagulants 04/20/2016  . Left displaced femoral neck fracture ([place]) 03/11/2016  . Dysuria 02/07/2016  . Acute ischemic stroke ([place]) 11/16/2015  . 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 Esophageal stenosis   . CHF (congestive heart failure) ([place]) 11/12/2015  . Recurrent UTI 05/13/2015  . Drug rash 04/09/2015  . Chest pain at rest 03/29/2015  . Pedal edema 02/07/2015  . CN (constipation) 01/15/2015  . Orthostatic hypotension 11/24/2014  . Syncope 11/07/2014  . Advanced care planning/counseling discussion 11/06/2014  . Daytime somnolence 11/06/2014  . Macrocytic anemia   . History of colonic polyps 03/27/2014  . Trigger finger, acquired 01/05/2014  . Osteoporosis 11/06/2013  . Dementia 06/08/2013  . Vertigo 01/28/2013  . Bilateral hearing loss 06/07/2012  . GERD (gastroesophageal reflux disease) 04/30/2012  . Dysphagia 12/30/2011  . HLD (hyperlipidemia) 12/08/2011  . MDD (major depressive disorder), recurrent episode, moderate ([place])   . Medicare annual wellness visit, subsequent 06/09/2011  . UNSTEADY GAIT 04/16/2010  . Vitamin B12 deficiency 11/01/2008  . Vitamin D deficiency 11/01/2008  . Essential tremor 09/27/2008  . HSV 03/08/2007  . Thrombocytopenia ([place]) 03/08/2007  . Coronary atherosclerosis 03/08/2007  . CKD  (chronic kidney disease) stage 3, GFR  30-59 ml/min 03/08/2007  . Urge incontinence 03/08/2007  . RENAL CALCULUS, HX OF 03/08/2007  . THROMBOPHLEBITIS 01/04/2007  . Personal history of pulmonary embolism 09/08/1990   Stacy Gardner, SPT  This entire session was performed under direct supervision and direction of a licensed therapist/therapist assistant . I have personally read, edited and approve of the note as written.  Trotter,Margaret PT, DPT 06/16/2016, 5:52 PM  [place] MAIN [facility] SERVICES [address] [place], [place], [address] Phone: [phone]   Fax:  [phone]  Name: [name] MRN: [id number] Date of Birth: 02/04/1931

## 2016-06-18 ENCOUNTER — Ambulatory Visit: Payer: Medicare Other | Admitting: Physical Therapy

## 2016-06-18 ENCOUNTER — Encounter: Payer: Self-pay | Admitting: Physical Therapy

## 2016-06-18 DIAGNOSIS — R262 Difficulty in walking, not elsewhere classified: Secondary | ICD-10-CM

## 2016-06-18 DIAGNOSIS — R42 Dizziness and giddiness: Secondary | ICD-10-CM

## 2016-06-18 NOTE — Therapy (Signed)
[place] MAIN [facility] SERVICES [address] [place], [place], [address] Phone: [phone]   Fax:  [phone]  Physical Therapy Treatment  Patient Details  Name: [name] MRN: [id number] Date of Birth: February 28, 1931 Referring Provider: Ria Bush  Encounter Date: 06/18/2016      PT End of Session - 06/18/16 1749    Visit Number 5   Number of Visits 13   Date for PT Re-Evaluation 07/26/2016   Authorization Type g codes   Authorization Time Period 5/10   PT Start Time 1700   PT Stop Time [id number]   PT Time Calculation (min) 45 min   Equipment Utilized During Treatment Gait belt   Activity Tolerance Patient tolerated treatment well;No increased pain   Behavior During Therapy WFL for tasks assessed/performed      Past Medical History:  Diagnosis Date  . Allergic rhinitis   . Arthritis   . Asthma    remote  . Blood transfusion 1990's  . CAD (coronary artery disease)    cath 2000 30% single vessel, normal nuclear stress test 12/03/2010, no evidence ischemia  . Candidal urethritis in male 06/15/2015  . CKD (chronic kidney disease) stage 3, GFR 30-59 ml/min    baseline Cr 1.7  . Closed C7 fracture ([place]) 11/06/2014  . DDD (degenerative disc disease) 03/2013   by CT, diffuse multilevel cervical and lumbar spondylosis  . Depression   . DISH (diffuse idiopathic skeletal hyperostosis) 03/2013   lumbar spine on xray  . Dizziness    multifactorial, s/p PT at [facility] 06/2014 with HEP  . Elevated PSA    previous-normalized (followed by Dr. Jeffie Pollock, rec no repeat unless urinary sxs)  . Essential tremor 09/27/2008   reviewed eval by Dr Jannifer Franklin in chart 2011   . GERD (gastroesophageal reflux disease)    h/o PUD  . Hearing loss 06/2012   eval - rec annual exam  . History of fracture of right hip   . History of phlebitis   . History of pyelonephritis 05/2014   hospitalization with sepsis  . HLD (hyperlipidemia)    hypertriglyceridemia  . Hx  pulmonary embolism 08/1991   negative hypercoagulable panel 12/2014  . Macrocytic anemia 2014   stable B12/folate and periph smear 05/2013, again periph smear 2017 with mature cells, mild dacrocytosis  . Osteoporosis 11/2013   DEXA T score -2.7  . Prediabetes   . Schatzki's ring 02/2012   s/p dilation [name]  . Sleep apnea    no CPAP- sleep apnea "cleared up 15 years ago"  . Stroke ([place])   . Urge incontinence   . UTI (urinary tract infection) due to Enterococcus 05/13/2015    Past Surgical History:  Procedure Laterality Date  . APPENDECTOMY  1996  . CARDIAC CATHETERIZATION  2000   30% one vessel  . CARDIOVASCULAR STRESS TEST  03/2015   no ischemia, low risk, EF 76%  . CAROTID U/S  12/28/2007   nml  . CATARACT EXTRACTION  09/2009   bilateral  . COLONOSCOPY  [place]   [place]  . COLONOSCOPY  07/2010   5 polyps, adenomatous, rec rpt 3 yrs  . COLONOSCOPY  04/2014   3 polyps, adenomatous, f/u open ended given age [name]  . CT ABD W & PELVIS WO CM  07/2001   Scarring of right lung, stable negative o/w  . CT ABD W & PELVIS WO CM  11/2000   ? stones, right LL scarring  . CYSTOSCOPY  1992  for kidney stones  . EEG  11/12/2009   nml  . ESOPHAGOGASTRODUODENOSCOPY  02/2012   dilation of schatzki's ring [name]  . ESOPHAGOGASTRODUODENOSCOPY N/A 11/15/2015   Procedure: ESOPHAGOGASTRODUODENOSCOPY (EGD);  Surgeon: Gatha Mayer, MD;  Location: [facility] ENDOSCOPY;  Service: Endoscopy;  Laterality: N/A;  . [place]   with incidental appendectomy  . HERNIA REPAIR  1979   Left  . HIP PINNING,CANNULATED Left 03/11/2016   Procedure: CANNULATED HIP PINNING;  Surgeon: Rod Can, MD;  Location: [place];  Service: Orthopedics;  Laterality: Left;  . KNEE ARTHROSCOPY  03/24/02   Right (Dr. Mauri Pole)  . MRI  11/2009   Head, nml  . ORIF FEMORAL NECK FRACTURE W/ DHS Right 08/03/2002   Dr Mauri Pole  . TONSILLECTOMY  1965  . US ECHOCARDIOGRAPHY  12/28/2007   Mild aortic valve  calcification EF 55%, basically nml  . V/Q SCAN  06/1999   negative    There were no vitals filed for this visit.      Subjective Assessment - 06/18/16 1705    Subjective Pt reports L hip pain about 4/10 today and feels achy.     Patient is accompained by: Family member   Pertinent History Pt ambulates with RW, rollator, or w/c. He only uses w/c when he is going for appointments.Pt has a severe benign tremor per pt's wife, who reports that his neurologist believes the tremor is the cause of his frequent falls. He is taking gabapentin for this which does not appear to be helping. Pt has had multiple CVAs in the past prior to onset of incr. falls. Pt lives with wife who is responsible for daily care.   Diagnostic tests imaging pre and post surgery and with recent trip to ED when he was concerned that he had injured his surgery.   Patient Stated Goals get stronger and walk better   Currently in Pain? Yes   Pain Score 4    Pain Location Hip   Pain Orientation Left   Pain Descriptors / Indicators Aching       Treatment Nustep x 4 mins BUEs/BLEs level 1 (unbilled)  Step ups on 6 inch step, alternating LEs, 2 sets x 10 reps, min VCs for upright posture and increased foot clearance  Lateral step ups on 6 inch step, x 10 leading with each leg, min VCs to increase HHA, pt required 2 HHA and CGA from therapist, min VCs for increased step length  Ball toss standing on purple airex pad, 2 sets x 10 reps feet apart, 1 set x 10 with feet together, min VCs for increased glut activation and upright posture  Oblique ball twist standing on purple airex pad,  2 sets x 10 reps, min VCs for increased trunk rotation  Standing resisted hip flexion with red theraband resistance, 2 sets x 10 rep alternating LEs, 2 HHA, min VCs for increased hip flexion  Leg Press, #120, BLEs, 2 sets x 10 reps, min VCs for proper positioning and eccentric control                           PT Education -  06/18/16 1748    Education provided Yes   Education Details HEP, walking program    Person(s) Educated Patient   Methods Explanation;Demonstration;Verbal cues   Comprehension Verbalized understanding;Returned demonstration;Verbal cues required             PT Long Term Goals - 06/04/16 [id number]  PT LONG TERM GOAL #1   Title Pt will be able to perform sit<>stand safely with no vebal cuing required to decr. risk of falls   Baseline pt requires verbal cuing to avoid pulling up with rollator   Time 6   Period Weeks   Status New     PT LONG TERM GOAL #2   Title Pt will improve TUG to less than 11 sec. indicating decr. risk for falls   Time 6   Period Weeks   Status New     PT LONG TERM GOAL #3   Title Pt will participate in further balance assessment   Time 6   Period Weeks   Status New               Plan - 06/18/16 1749    Clinical Impression Statement Pt reports decrease in L hip pain after participating in PT session focsued on balance and LE strengthening.  Advanced standing balance activites on purple airex pad, pt was able to maintain upright posture with min VCs without HHA throughout.  Advanced hip/quad strengthening with forward step ups/lateral step ups on 6 inch step with 2 HHA for stability and CGA for therapist.  Pt would continue to benefit from further skilled PT to increase his LE strength, balance and endurance for greater functional mobility.     Rehab Potential Fair   Clinical Impairments Affecting Rehab Potential medical comorbidities/family support   PT Frequency 2x / week   PT Duration 6 weeks   PT Treatment/Interventions ADLs/Self Care Home Management;Aquatic Therapy;Gait training;Therapeutic activities;Therapeutic exercise;Functional mobility training;Stair training;Balance training;Neuromuscular re-education;Manual techniques;Patient/family education;Passive range of motion;Vestibular   PT Next Visit Plan BERG, static standing balance, walking  without walker    Consulted and Agree with Plan of Care Patient;Family member/caregiver      Patient will benefit from skilled therapeutic intervention in order to improve the following deficits and impairments:  Improper body mechanics, Decreased balance, Difficulty walking, Decreased activity tolerance, Decreased coordination, Abnormal gait, Decreased cognition  Visit Diagnosis: Difficulty in walking, not elsewhere classified  Dizziness and giddiness     Problem List Patient Active Problem List   Diagnosis Date Noted  . Encounter for current long-term use of anticoagulants 04/20/2016  . Left displaced femoral neck fracture ([place]) 03/11/2016  . Dysuria 02/07/2016  . Acute ischemic stroke ([place]) 11/16/2015  . 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 Esophageal stenosis   . CHF (congestive heart failure) ([place]) 11/12/2015  . Recurrent UTI 05/13/2015  . Drug rash 04/09/2015  . Chest pain at rest 03/29/2015  . Pedal edema 02/07/2015  . CN (constipation) 01/15/2015  . Orthostatic hypotension 11/24/2014  . Syncope 11/07/2014  . Advanced care planning/counseling discussion 11/06/2014  . Daytime somnolence 11/06/2014  . Macrocytic anemia   . History of colonic polyps 03/27/2014  . Trigger finger, acquired 01/05/2014  . Osteoporosis 11/06/2013  . Dementia 06/08/2013  . Vertigo 01/28/2013  . Bilateral hearing loss 06/07/2012  . GERD (gastroesophageal reflux disease) 04/30/2012  . Dysphagia 12/30/2011  . HLD (hyperlipidemia) 12/08/2011  . MDD (major depressive disorder), recurrent episode, moderate ([place])   . Medicare annual wellness visit, subsequent 06/09/2011  . UNSTEADY GAIT 04/16/2010  . Vitamin B12 deficiency 11/01/2008  . Vitamin D deficiency 11/01/2008  . Essential tremor 09/27/2008  . HSV 03/08/2007  . Thrombocytopenia ([place]) 03/08/2007  . Coronary atherosclerosis 03/08/2007  . CKD (chronic kidney disease) stage 3, GFR 30-59 ml/min 03/08/2007  . Urge incontinence  03/08/2007  . RENAL CALCULUS,  HX OF 03/08/2007  . THROMBOPHLEBITIS 01/04/2007  . Personal history of pulmonary embolism 09/08/1990   Stacy Gardner, SPT  This entire session was performed under direct supervision and direction of a licensed therapist/therapist assistant . I have personally read, edited and approve of the note as written.  Trotter,Margaret PT, DPT 06/18/2016, 6:21 PM  [place] MAIN [facility] SERVICES [address] [place], [place], [address] Phone: [phone]   Fax:  [phone]  Name: [name] MRN: [id number] Date of Birth: 15-Sep-1930

## 2016-06-23 ENCOUNTER — Telehealth: Payer: Self-pay | Admitting: Neurology

## 2016-06-23 NOTE — Telephone Encounter (Signed)
Dr Jannifer Franklin- see note
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
## 2016-06-23 NOTE — Telephone Encounter (Signed)
Patient's wife is calling regarding the patient. She would like to discuss the patient getting disability from the [place]. The patient does have forms to be filled out but his wife wants to discuss with you first.

## 2016-06-24 ENCOUNTER — Encounter: Payer: Self-pay | Admitting: Physical Therapy

## 2016-06-24 ENCOUNTER — Ambulatory Visit: Payer: Medicare Other | Admitting: Physical Therapy

## 2016-06-24 DIAGNOSIS — R42 Dizziness and giddiness: Secondary | ICD-10-CM | POA: Diagnosis not present

## 2016-06-24 DIAGNOSIS — R262 Difficulty in walking, not elsewhere classified: Secondary | ICD-10-CM

## 2016-06-24 NOTE — Therapy (Signed)
[place] MAIN [facility] SERVICES [address] [place], [place], [address] Phone: [phone]   Fax:  [phone]  Physical Therapy Treatment  Patient Details  Name: [name] MRN: [id number] Date of Birth: [birth year] Referring Provider: Ria Bush  Encounter Date: 06/24/2016      PT End of Session - 06/24/16 1649    Visit Number 6   Number of Visits 13   Date for PT Re-Evaluation 08-Aug-2016   Authorization Type g codes   Authorization Time Period 6/10   PT Start Time [id number]   PT Stop Time 1700   PT Time Calculation (min) 45 min   Equipment Utilized During Treatment Gait belt   Activity Tolerance Patient tolerated treatment well;No increased pain   Behavior During Therapy WFL for tasks assessed/performed      Past Medical History:  Diagnosis Date  . Allergic rhinitis   . Arthritis   . Asthma    remote  . Blood transfusion 1990's  . CAD (coronary artery disease)    cath 2000 30% single vessel, normal nuclear stress test 12/03/2010, no evidence ischemia  . Candidal urethritis in male 06/15/2015  . CKD (chronic kidney disease) stage 3, GFR 30-59 ml/min    baseline Cr 1.7  . Closed C7 fracture ([place]) 11/06/2014  . DDD (degenerative disc disease) 03/2013   by CT, diffuse multilevel cervical and lumbar spondylosis  . Depression   . DISH (diffuse idiopathic skeletal hyperostosis) 03/2013   lumbar spine on xray  . Dizziness    multifactorial, s/p PT at [facility] 06/2014 with HEP  . Elevated PSA    previous-normalized (followed by Dr. Jeffie Pollock, rec no repeat unless urinary sxs)  . Essential tremor 09/27/2008   reviewed eval by Dr Jannifer Franklin in chart 2011   . GERD (gastroesophageal reflux disease)    h/o PUD  . Hearing loss 06/2012   eval - rec annual exam  . History of fracture of right hip   . History of phlebitis   . History of pyelonephritis 05/2014   hospitalization with sepsis  . HLD (hyperlipidemia)    hypertriglyceridemia  . Hx  pulmonary embolism 08/1991   negative hypercoagulable panel 12/2014  . Macrocytic anemia 2014   stable B12/folate and periph smear 05/2013, again periph smear 2017 with mature cells, mild dacrocytosis  . Osteoporosis 11/2013   DEXA T score -2.7  . Prediabetes   . Schatzki's ring 02/2012   s/p dilation [name]  . Sleep apnea    no CPAP- sleep apnea "cleared up 15 years ago"  . Stroke ([place])   . Urge incontinence   . UTI (urinary tract infection) due to Enterococcus 05/13/2015    Past Surgical History:  Procedure Laterality Date  . APPENDECTOMY  1996  . CARDIAC CATHETERIZATION  2000   30% one vessel  . CARDIOVASCULAR STRESS TEST  03/2015   no ischemia, low risk, EF 76%  . CAROTID U/S  12/28/2007   nml  . CATARACT EXTRACTION  09/2009   bilateral  . COLONOSCOPY  1998   [place]  . COLONOSCOPY  07/2010   5 polyps, adenomatous, rec rpt 3 yrs  . COLONOSCOPY  04/2014   3 polyps, adenomatous, f/u open ended given age [name]  . CT ABD W & PELVIS WO CM  07/2001   Scarring of right lung, stable negative o/w  . CT ABD W & PELVIS WO CM  11/2000   ? stones, right LL scarring  . CYSTOSCOPY  1992  for kidney stones  . EEG  11/12/2009   nml  . ESOPHAGOGASTRODUODENOSCOPY  02/2012   dilation of schatzki's ring [name]  . ESOPHAGOGASTRODUODENOSCOPY N/A 11/15/2015   Procedure: ESOPHAGOGASTRODUODENOSCOPY (EGD);  Surgeon: Gatha Mayer, MD;  Location: [facility] ENDOSCOPY;  Service: Endoscopy;  Laterality: N/A;  . [place]   with incidental appendectomy  . HERNIA REPAIR  1979   Left  . HIP PINNING,CANNULATED Left 03/11/2016   Procedure: CANNULATED HIP PINNING;  Surgeon: Rod Can, MD;  Location: [place];  Service: Orthopedics;  Laterality: Left;  . KNEE ARTHROSCOPY  03/24/02   Right (Dr. Mauri Pole)  . MRI  11/2009   Head, nml  . ORIF FEMORAL NECK FRACTURE W/ DHS Right 08/03/2002   Dr Mauri Pole  . TONSILLECTOMY  1965  . US ECHOCARDIOGRAPHY  12/28/2007   Mild aortic valve  calcification EF 55%, basically nml  . V/Q SCAN  06/1999   negative    There were no vitals filed for this visit.      Subjective Assessment - 06/24/16 1621    Subjective Pt reports very little L hip pain today and that he is overall doing better.     Patient is accompained by: Family member   Pertinent History Pt ambulates with RW, rollator, or w/c. He only uses w/c when he is going for appointments.Pt has a severe benign tremor per pt's wife, who reports that his neurologist believes the tremor is the cause of his frequent falls. He is taking gabapentin for this which does not appear to be helping. Pt has had multiple CVAs in the past prior to onset of incr. falls. Pt lives with wife who is responsible for daily care.   Diagnostic tests imaging pre and post surgery and with recent trip to ED when he was concerned that he had injured his surgery.   Patient Stated Goals get stronger and walk better   Currently in Pain? Yes   Pain Score 1    Pain Location Hip   Pain Orientation Left   Pain Descriptors / Indicators Aching     Treatment Nustep x 4 mins BUEs/BLEs level 1 (unbilled)  Mini squats with yellow theraband resistance, min VCs for proper form  4 way hip with yellow theraband resistance, 1 set x 10 reps, flexion/abduction/extension, min VCs to unlock L knee for greater comfort  Tandem stance oblique ball twist, 2 sets x 10 with each leg in front, min A to bring weight posteriorly, pt required min VCs for greater stability in BOS Bosu ball taps, 2 sets x 10 reps, 1 HHA, pt unable to progress to no HHA due to LOB, CGA for safety, min VCs to maintain forward gaze  Leg Press, BLEs, 2 sets x 10 reps, #130, min VCs for slower eccentric control and proper hip positioning                             PT Education - 06/24/16 1648    Education provided Yes   Education Details lumbar roll for back pain    Person(s) Educated Patient   Methods  Explanation;Demonstration;Verbal cues   Comprehension Verbalized understanding;Returned demonstration;Verbal cues required             PT Long Term Goals - 06/04/16 0951      PT LONG TERM GOAL #1   Title Pt will be able to perform sit<>stand safely with no vebal cuing required to decr. risk of  falls   Baseline pt requires verbal cuing to avoid pulling up with rollator   Time 6   Period Weeks   Status New     PT LONG TERM GOAL #2   Title Pt will improve TUG to less than 11 sec. indicating decr. risk for falls   Time 6   Period Weeks   Status New     PT LONG TERM GOAL #3   Title Pt will participate in further balance assessment   Time 6   Period Weeks   Status New               Plan - 06/24/16 1653    Clinical Impression Statement Pt reports less L hip pain today than previous sessions  but continues to fatigue quickly and require seated rest breaks throughout session.  Pt was able to progress standing balance activites in tandem with oblique ball pass without LOB but did require min VCs to bring weight posteriorly for greater stability.  4 way hip was progressed to 10 reps each direction with yellow theraband resistance. Pt requires min VCs for increased glut activation and maintaining knee extension throughout 4  Way hip. He would continue to benefit from skilled PT to work on LE strength, endurance and standing balance activites for safer and more functional mobility.     Rehab Potential Fair   Clinical Impairments Affecting Rehab Potential medical comorbidities/family support   PT Frequency 2x / week   PT Duration 6 weeks   PT Treatment/Interventions ADLs/Self Care Home Management;Aquatic Therapy;Gait training;Therapeutic activities;Therapeutic exercise;Functional mobility training;Stair training;Balance training;Neuromuscular re-education;Manual techniques;Patient/family education;Passive range of motion;Vestibular   PT Next Visit Plan BERG, static standing balance,  walking without walker    Consulted and Agree with Plan of Care Patient;Family member/caregiver      Patient will benefit from skilled therapeutic intervention in order to improve the following deficits and impairments:  Improper body mechanics, Decreased balance, Difficulty walking, Decreased activity tolerance, Decreased coordination, Abnormal gait, Decreased cognition  Visit Diagnosis: Difficulty in walking, not elsewhere classified  Dizziness and giddiness     Problem List Patient Active Problem List   Diagnosis Date Noted  . Encounter for current long-term use of anticoagulants 04/20/2016  . Left displaced femoral neck fracture ([place]) 03/11/2016  . Dysuria 02/07/2016  . Acute ischemic stroke ([place]) 11/16/2015  . 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 Esophageal stenosis   . CHF (congestive heart failure) ([place]) 11/12/2015  . Recurrent UTI 05/13/2015  . Drug rash 04/09/2015  . Chest pain at rest 03/29/2015  . Pedal edema 02/07/2015  . CN (constipation) 01/15/2015  . Orthostatic hypotension 11/24/2014  . Syncope 11/07/2014  . Advanced care planning/counseling discussion 11/06/2014  . Daytime somnolence 11/06/2014  . Macrocytic anemia   . History of colonic polyps 03/27/2014  . Trigger finger, acquired 01/05/2014  . Osteoporosis 11/06/2013  . Dementia 06/08/2013  . Vertigo 01/28/2013  . Bilateral hearing loss 06/07/2012  . GERD (gastroesophageal reflux disease) 04/30/2012  . Dysphagia 12/30/2011  . HLD (hyperlipidemia) 12/08/2011  . MDD (major depressive disorder), recurrent episode, moderate ([place])   . Medicare annual wellness visit, subsequent 06/09/2011  . UNSTEADY GAIT 04/16/2010  . Vitamin B12 deficiency 11/01/2008  . Vitamin D deficiency 11/01/2008  . Essential tremor 09/27/2008  . HSV 03/08/2007  . Thrombocytopenia ([place]) 03/08/2007  . Coronary atherosclerosis 03/08/2007  . CKD (chronic kidney disease) stage 3, GFR 30-59 ml/min 03/08/2007  . Urge  incontinence 03/08/2007  . RENAL CALCULUS, HX OF  03/08/2007  . THROMBOPHLEBITIS 01/04/2007  . Personal history of pulmonary embolism 09/08/1990   Stacy Gardner, SPT   This entire session was performed under direct supervision and direction of a licensed therapist/therapist assistant . I have personally read, edited and approve of the note as written.  Collie Siad PT, DPT 06/24/2016, 5:06 PM  [place] MAIN [facility] SERVICES [address] [place], [place], [address] Phone: [phone]   Fax:  [phone]  Name: [name] MRN: [id number] Date of Birth: [birth year]

## 2016-07-02 ENCOUNTER — Encounter: Payer: Self-pay | Admitting: Physical Therapy

## 2016-07-02 ENCOUNTER — Ambulatory Visit: Payer: Medicare Other | Admitting: Physical Therapy

## 2016-07-02 VITALS — BP 110/46 | HR 86

## 2016-07-02 DIAGNOSIS — R262 Difficulty in walking, not elsewhere classified: Secondary | ICD-10-CM

## 2016-07-02 DIAGNOSIS — R42 Dizziness and giddiness: Secondary | ICD-10-CM

## 2016-07-02 NOTE — Therapy (Signed)
[place] MAIN [facility] SERVICES [address] [place], [place], [address] Phone: [phone]   Fax:  [phone]  Physical Therapy Treatment  Patient Details  Name: [name] MRN: [id number] Date of Birth: 1931-02-22 Referring Provider: Ria Bush  Encounter Date: 07/02/2016      PT End of Session - 07/02/16 1408    Visit Number 7   Number of Visits 13   Date for PT Re-Evaluation Jul 27, 2016   Authorization Type g codes   Authorization Time Period 7/10   PT Start Time [id number]   PT Stop Time [id number]   PT Time Calculation (min) 43 min   Equipment Utilized During Treatment Gait belt   Activity Tolerance Patient tolerated treatment well;No increased pain   Behavior During Therapy WFL for tasks assessed/performed      Past Medical History:  Diagnosis Date  . Allergic rhinitis   . Arthritis   . Asthma    remote  . Blood transfusion 1990's  . CAD (coronary artery disease)    cath 2000 30% single vessel, normal nuclear stress test 12/03/2010, no evidence ischemia  . Candidal urethritis in male 06/15/2015  . CKD (chronic kidney disease) stage 3, GFR 30-59 ml/min    baseline Cr 1.7  . Closed C7 fracture ([place]) 11/06/2014  . DDD (degenerative disc disease) 03/2013   by CT, diffuse multilevel cervical and lumbar spondylosis  . Depression   . DISH (diffuse idiopathic skeletal hyperostosis) 03/2013   lumbar spine on xray  . Dizziness    multifactorial, s/p PT at [facility] 06/2014 with HEP  . Elevated PSA    previous-normalized (followed by Dr. Jeffie Pollock, rec no repeat unless urinary sxs)  . Essential tremor 09/27/2008   reviewed eval by Dr Jannifer Franklin in chart 2011   . GERD (gastroesophageal reflux disease)    h/o PUD  . Hearing loss 06/2012   eval - rec annual exam  . History of fracture of right hip   . History of phlebitis   . History of pyelonephritis 05/2014   hospitalization with sepsis  . HLD (hyperlipidemia)    hypertriglyceridemia  . Hx  pulmonary embolism 08/1991   negative hypercoagulable panel 12/2014  . Macrocytic anemia 2014   stable B12/folate and periph smear 05/2013, again periph smear 2017 with mature cells, mild dacrocytosis  . Osteoporosis 11/2013   DEXA T score -2.7  . Prediabetes   . Schatzki's ring 02/2012   s/p dilation [name]  . Sleep apnea    no CPAP- sleep apnea "cleared up 15 years ago"  . Stroke ([place])   . Urge incontinence   . UTI (urinary tract infection) due to Enterococcus 05/13/2015    Past Surgical History:  Procedure Laterality Date  . APPENDECTOMY  1996  . CARDIAC CATHETERIZATION  2000   30% one vessel  . CARDIOVASCULAR STRESS TEST  03/2015   no ischemia, low risk, EF 76%  . CAROTID U/S  12/28/2007   nml  . CATARACT EXTRACTION  09/2009   bilateral  . COLONOSCOPY  1998   [place]  . COLONOSCOPY  07/2010   5 polyps, adenomatous, rec rpt 3 yrs  . COLONOSCOPY  04/2014   3 polyps, adenomatous, f/u open ended given age [name]  . CT ABD W & PELVIS WO CM  07/2001   Scarring of right lung, stable negative o/w  . CT ABD W & PELVIS WO CM  11/2000   ? stones, right LL scarring  . CYSTOSCOPY  1992  for kidney stones  . EEG  11/12/2009   nml  . ESOPHAGOGASTRODUODENOSCOPY  02/2012   dilation of schatzki's ring [name]  . ESOPHAGOGASTRODUODENOSCOPY N/A 11/15/2015   Procedure: ESOPHAGOGASTRODUODENOSCOPY (EGD);  Surgeon: Gatha Mayer, MD;  Location: [facility] ENDOSCOPY;  Service: Endoscopy;  Laterality: N/A;  . [place]   with incidental appendectomy  . HERNIA REPAIR  1979   Left  . HIP PINNING,CANNULATED Left 03/11/2016   Procedure: CANNULATED HIP PINNING;  Surgeon: Rod Can, MD;  Location: [place];  Service: Orthopedics;  Laterality: Left;  . KNEE ARTHROSCOPY  03/24/02   Right (Dr. Mauri Pole)  . MRI  11/2009   Head, nml  . ORIF FEMORAL NECK FRACTURE W/ DHS Right 08/03/2002   Dr Mauri Pole  . TONSILLECTOMY  1965  . US ECHOCARDIOGRAPHY  12/28/2007   Mild aortic valve  calcification EF 55%, basically nml  . V/Q SCAN  06/1999   negative    Vitals:   07/02/16 1406  BP: (!) 110/46  Pulse: 86  SpO2: 100%        Subjective Assessment - 07/02/16 1406    Subjective Pt reports 6/10 L hip pain today which is after just using his TENs unit.  Pt reports trying his new HEP this weekend.     Patient is accompained by: Family member   Pertinent History Pt ambulates with RW, rollator, or w/c. He only uses w/c when he is going for appointments.Pt has a severe benign tremor per pt's wife, who reports that his neurologist believes the tremor is the cause of his frequent falls. He is taking gabapentin for this which does not appear to be helping. Pt has had multiple CVAs in the past prior to onset of incr. falls. Pt lives with wife who is responsible for daily care.   Diagnostic tests imaging pre and post surgery and with recent trip to ED when he was concerned that he had injured his surgery.   Patient Stated Goals get stronger and walk better   Currently in Pain? Yes   Pain Score 6    Pain Location Hip   Pain Orientation Left       Treatment Nustep x 4 mins BUEs/BLEs level 1 (unbilled)  Clamshells, 2 sets x 10 reps side lying BLEs, min VCs for initial positioning greater hip abduction ROM Glut bridges in supine, 2 sets x 10 reps, min VCs for increased glut activation and min VCs for proper breathing  Bosu step taps, 2 sets x 10 reps, min A for stability, 1 HHA, several LOB corrected by PT, pt demonstrated increased difficulty stepping with RLE compared to LLE  Tandem walking on airex balance beam forwards/backwards x 2 laps each direction, 1 HHA, min A to stabilize, intermittent assistance with 2HHA Sidestepping on airex balance beam x 2 laps, 1 HHA, min VCs to keep gaze forward, and upright posture   Resisted standing hip flexion with red theraband resistance, 2 sets x 15 reps, min VCS for upright posture and increased hip flexion, 2  HHA                          PT Education - 07/02/16 1407    Education provided Yes   Education Details reinforced HEP and lumbar roll    Person(s) Educated Patient   Methods Explanation;Demonstration;Verbal cues   Comprehension Verbalized understanding;Returned demonstration;Verbal cues required             PT Long Term  Goals - 06/04/16 0951      PT LONG TERM GOAL #1   Title Pt will be able to perform sit<>stand safely with no vebal cuing required to decr. risk of falls   Baseline pt requires verbal cuing to avoid pulling up with rollator   Time 6   Period Weeks   Status New     PT LONG TERM GOAL #2   Title Pt will improve TUG to less than 11 sec. indicating decr. risk for falls   Time 6   Period Weeks   Status New     PT LONG TERM GOAL #3   Title Pt will participate in further balance assessment   Time 6   Period Weeks   Status New               Plan - 07/02/16 1447    Clinical Impression Statement Pt reported 6/10 L hip pain at beginning of session but reduced to 1/10 by end of session with lots of seated rest breaks throughout.  Pt had difficulty performing dynamic balance activites weightbearing through LLE and stepping with RLE.  Initiated bosu step taps with 1HHA and min A from therapist, pt was unable to perform LLE stance time without some LOB.  Continued to emphasize HEP and drinking lots of water before therapy.  He would continue to benefit from skilled PT to increase his balance, LE strength and endurance for increased functional mobility.     Rehab Potential Fair   Clinical Impairments Affecting Rehab Potential medical comorbidities/family support   PT Frequency 2x / week   PT Duration 6 weeks   PT Treatment/Interventions ADLs/Self Care Home Management;Aquatic Therapy;Gait training;Therapeutic activities;Therapeutic exercise;Functional mobility training;Stair training;Balance training;Neuromuscular re-education;Manual  techniques;Patient/family education;Passive range of motion;Vestibular   PT Next Visit Plan walking without walker, leg press    Consulted and Agree with Plan of Care Patient;Family member/caregiver      Patient will benefit from skilled therapeutic intervention in order to improve the following deficits and impairments:  Improper body mechanics, Decreased balance, Difficulty walking, Decreased activity tolerance, Decreased coordination, Abnormal gait, Decreased cognition  Visit Diagnosis: Difficulty in walking, not elsewhere classified  Dizziness and giddiness     Problem List Patient Active Problem List   Diagnosis Date Noted  . Encounter for current long-term use of anticoagulants 04/20/2016  . Left displaced femoral neck fracture ([place]) 03/11/2016  . Dysuria 02/07/2016  . Acute ischemic stroke ([place]) 11/16/2015  . 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 Esophageal stenosis   . CHF (congestive heart failure) ([place]) 11/12/2015  . Recurrent UTI 05/13/2015  . Drug rash 04/09/2015  . Chest pain at rest 03/29/2015  . Pedal edema 02/07/2015  . CN (constipation) 01/15/2015  . Orthostatic hypotension 11/24/2014  . Syncope 11/07/2014  . Advanced care planning/counseling discussion 11/06/2014  . Daytime somnolence 11/06/2014  . Macrocytic anemia   . History of colonic polyps 03/27/2014  . Trigger finger, acquired 01/05/2014  . Osteoporosis 11/06/2013  . Dementia 06/08/2013  . Vertigo 01/28/2013  . Bilateral hearing loss 06/07/2012  . GERD (gastroesophageal reflux disease) 04/30/2012  . Dysphagia 12/30/2011  . HLD (hyperlipidemia) 12/08/2011  . MDD (major depressive disorder), recurrent episode, moderate ([place])   . Medicare annual wellness visit, subsequent 06/09/2011  . UNSTEADY GAIT 04/16/2010  . Vitamin B12 deficiency 11/01/2008  . Vitamin D deficiency 11/01/2008  . Essential tremor 09/27/2008  . HSV 03/08/2007  . Thrombocytopenia ([place]) 03/08/2007  . Coronary  atherosclerosis 03/08/2007  . CKD (  chronic kidney disease) stage 3, GFR 30-59 ml/min 03/08/2007  . Urge incontinence 03/08/2007  . RENAL CALCULUS, HX OF 03/08/2007  . THROMBOPHLEBITIS 01/04/2007  . Personal history of pulmonary embolism 09/08/1990   Stacy Gardner, SPT  This entire session was performed under direct supervision and direction of a licensed therapist/therapist assistant . I have personally read, edited and approve of the note as written.  Trotter,Margaret PT, DPT 07/02/2016, 4:10 PM  [place] MAIN [facility] SERVICES [address] [place], [place], [address] Phone: [phone]   Fax:  [phone]  Name: BRIJESH VITE MRN: [id number] Date of Birth: May 19, 1931

## 2016-07-08 ENCOUNTER — Emergency Department (HOSPITAL_COMMUNITY): Payer: Medicare Other

## 2016-07-08 ENCOUNTER — Encounter (HOSPITAL_COMMUNITY): Payer: Self-pay | Admitting: Emergency Medicine

## 2016-07-08 ENCOUNTER — Emergency Department (HOSPITAL_COMMUNITY)
Admission: EM | Admit: 2016-07-08 | Discharge: 2016-07-08 | Disposition: A | Discharge status: Home / Self Care | Payer: Medicare Other | Attending: Emergency Medicine | Admitting: Emergency Medicine

## 2016-07-08 DIAGNOSIS — S3992XA Unspecified injury of lower back, initial encounter: Secondary | ICD-10-CM | POA: Diagnosis not present

## 2016-07-08 DIAGNOSIS — Y999 Unspecified external cause status: Secondary | ICD-10-CM | POA: Insufficient documentation

## 2016-07-08 DIAGNOSIS — S299XXA Unspecified injury of thorax, initial encounter: Secondary | ICD-10-CM | POA: Diagnosis not present

## 2016-07-08 DIAGNOSIS — S79911A Unspecified injury of right hip, initial encounter: Secondary | ICD-10-CM | POA: Diagnosis not present

## 2016-07-08 DIAGNOSIS — S59901A Unspecified injury of right elbow, initial encounter: Secondary | ICD-10-CM | POA: Diagnosis not present

## 2016-07-08 DIAGNOSIS — Y939 Activity, unspecified: Secondary | ICD-10-CM | POA: Diagnosis not present

## 2016-07-08 DIAGNOSIS — M545 Low back pain: Secondary | ICD-10-CM | POA: Diagnosis not present

## 2016-07-08 DIAGNOSIS — M25521 Pain in right elbow: Secondary | ICD-10-CM | POA: Diagnosis not present

## 2016-07-08 DIAGNOSIS — J45909 Unspecified asthma, uncomplicated: Secondary | ICD-10-CM | POA: Insufficient documentation

## 2016-07-08 DIAGNOSIS — W19XXXA Unspecified fall, initial encounter: Secondary | ICD-10-CM | POA: Insufficient documentation

## 2016-07-08 DIAGNOSIS — Z79899 Other long term (current) drug therapy: Secondary | ICD-10-CM | POA: Insufficient documentation

## 2016-07-08 DIAGNOSIS — S0990XA Unspecified injury of head, initial encounter: Secondary | ICD-10-CM | POA: Diagnosis not present

## 2016-07-08 DIAGNOSIS — M25552 Pain in left hip: Secondary | ICD-10-CM | POA: Diagnosis not present

## 2016-07-08 DIAGNOSIS — Z7982 Long term (current) use of aspirin: Secondary | ICD-10-CM | POA: Diagnosis not present

## 2016-07-08 DIAGNOSIS — Y92009 Unspecified place in unspecified non-institutional (private) residence as the place of occurrence of the external cause: Secondary | ICD-10-CM | POA: Insufficient documentation

## 2016-07-08 DIAGNOSIS — I509 Heart failure, unspecified: Secondary | ICD-10-CM | POA: Diagnosis not present

## 2016-07-08 DIAGNOSIS — T148XXA Other injury of unspecified body region, initial encounter: Secondary | ICD-10-CM | POA: Diagnosis not present

## 2016-07-08 DIAGNOSIS — S51011A Laceration without foreign body of right elbow, initial encounter: Secondary | ICD-10-CM | POA: Diagnosis not present

## 2016-07-08 DIAGNOSIS — R51 Headache: Secondary | ICD-10-CM | POA: Insufficient documentation

## 2016-07-08 DIAGNOSIS — N183 Chronic kidney disease, stage 3 (moderate): Secondary | ICD-10-CM | POA: Diagnosis not present

## 2016-07-08 DIAGNOSIS — M25551 Pain in right hip: Secondary | ICD-10-CM | POA: Diagnosis not present

## 2016-07-08 DIAGNOSIS — S199XXA Unspecified injury of neck, initial encounter: Secondary | ICD-10-CM | POA: Diagnosis not present

## 2016-07-08 DIAGNOSIS — S6991XA Unspecified injury of right wrist, hand and finger(s), initial encounter: Secondary | ICD-10-CM | POA: Diagnosis not present

## 2016-07-08 DIAGNOSIS — S2241XA Multiple fractures of ribs, right side, initial encounter for closed fracture: Secondary | ICD-10-CM | POA: Diagnosis not present

## 2016-07-08 DIAGNOSIS — R0781 Pleurodynia: Secondary | ICD-10-CM | POA: Diagnosis not present

## 2016-07-08 DIAGNOSIS — I2510 Atherosclerotic heart disease of native coronary artery without angina pectoris: Secondary | ICD-10-CM | POA: Insufficient documentation

## 2016-07-08 DIAGNOSIS — M25531 Pain in right wrist: Secondary | ICD-10-CM | POA: Diagnosis not present

## 2016-07-08 DIAGNOSIS — S51001A Unspecified open wound of right elbow, initial encounter: Secondary | ICD-10-CM | POA: Diagnosis not present

## 2016-07-08 MED ORDER — ACETAMINOPHEN 500 MG PO TABS
1000.0000 mg | ORAL_TABLET | Freq: Once | ORAL | Status: AC
  Administered 2016-07-08: 1000 mg via ORAL
  Filled 2016-07-08: qty 2

## 2016-07-08 NOTE — ED Triage Notes (Signed)
82 year old male patient from home who fell today while walking with walker.  Lives with wife he is c/o right hip and elbow pain.  Rates hip pain as a 6/10.  Right elbow has skin tear that EMS covered with gauze.  No loss of consciousness, patient did not hit head.  Patient not on blood thinners.  Alert and oriented.

## 2016-07-08 NOTE — ED Notes (Signed)
Patient remains in x-ray.  Wife at bedside.

## 2016-07-08 NOTE — ED Notes (Signed)
Patient returned from xray.

## 2016-07-08 NOTE — Discharge Instructions (Signed)
Use your incentive spirometer for 15 minutes every hour while awake.

## 2016-07-08 NOTE — ED Notes (Signed)
Patient transported to X-ray 

## 2016-07-08 NOTE — ED Notes (Signed)
Bed: WA21 Expected date:  Expected time:  Means of arrival:  Comments: EMS- elderly, fall/hip and elbow pain

## 2016-07-08 NOTE — ED Provider Notes (Signed)
[facility] DEPT Provider Note   CSN: [id number] Arrival date & time: 07/08/16  1337     History   Chief Complaint Chief Complaint  Patient presents with  . Fall    HPI [name] is a 82 y.o. male.  82 yo M with a chief complaint of a fall. Patient is unsteady at baseline and normally walks with a walker. He felt that his walker broke when he was walking and he fell onto his right side. Complaining of right-sided chest wall pain right elbow and right wrist and right hip pain. Denies head injury denies loss consciousness. Denies back pain. Denies abdominal tenderness. Last tetanus was about 6 years ago.   The history is provided by the patient and the spouse.  Fall  This is a new problem. The current episode started less than 1 hour ago. The problem occurs constantly. The problem has not changed since onset.Associated symptoms include headaches. Pertinent negatives include no chest pain, no abdominal pain and no shortness of breath. Nothing aggravates the symptoms. Nothing relieves the symptoms. He has tried nothing for the symptoms. The treatment provided no relief.    Past Medical History:  Diagnosis Date  . Allergic rhinitis   . Arthritis   . Asthma    remote  . Blood transfusion 1990's  . CAD (coronary artery disease)    cath 2000 30% single vessel, normal nuclear stress test 12/03/2010, no evidence ischemia  . Candidal urethritis in male 06/15/2015  . CKD (chronic kidney disease) stage 3, GFR 30-59 ml/min    baseline Cr 1.7  . Closed C7 fracture ([place]) 11/06/2014  . DDD (degenerative disc disease) 03/2013   by CT, diffuse multilevel cervical and lumbar spondylosis  . Depression   . DISH (diffuse idiopathic skeletal hyperostosis) 03/2013   lumbar spine on xray  . Dizziness    multifactorial, s/p PT at [facility] 06/2014 with HEP  . Elevated PSA    previous-normalized (followed by Dr. Jeffie Pollock, rec no repeat unless urinary sxs)  . Essential tremor 09/27/2008   reviewed  eval by Dr Jannifer Franklin in chart 2011   . GERD (gastroesophageal reflux disease)    h/o PUD  . Hearing loss 06/2012   eval - rec annual exam  . History of fracture of right hip   . History of phlebitis   . History of pyelonephritis 05/2014   hospitalization with sepsis  . HLD (hyperlipidemia)    hypertriglyceridemia  . Hx pulmonary embolism 08/1991   negative hypercoagulable panel 12/2014  . Macrocytic anemia 2014   stable B12/folate and periph smear 05/2013, again periph smear 2017 with mature cells, mild dacrocytosis  . Osteoporosis 11/2013   DEXA T score -2.7  . Prediabetes   . Schatzki's ring 02/2012   s/p dilation [name]  . Sleep apnea    no CPAP- sleep apnea "cleared up 15 years ago"  . Stroke ([place])   . Urge incontinence   . UTI (urinary tract infection) due to Enterococcus 05/13/2015    Patient Active Problem List   Diagnosis Date Noted  . Encounter for current long-term use of anticoagulants 04/20/2016  . Left displaced femoral neck fracture ([place]) 03/11/2016  . Dysuria 02/07/2016  . Acute ischemic stroke ([place]) 11/16/2015  . 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 Esophageal stenosis   . CHF (congestive heart failure) ([place]) 11/12/2015  . Recurrent UTI 05/13/2015  . Drug rash 04/09/2015  . Chest pain at rest 03/29/2015  . Pedal edema 02/07/2015  . CN (  constipation) 01/15/2015  . Orthostatic hypotension 11/24/2014  . Syncope 11/07/2014  . Advanced care planning/counseling discussion 11/06/2014  . Daytime somnolence 11/06/2014  . Macrocytic anemia   . History of colonic polyps 03/27/2014  . Trigger finger, acquired 01/05/2014  . Osteoporosis 11/06/2013  . Dementia 06/08/2013  . Vertigo 01/28/2013  . Bilateral hearing loss 06/07/2012  . GERD (gastroesophageal reflux disease) 04/30/2012  . Dysphagia 12/30/2011  . HLD (hyperlipidemia) 12/08/2011  . MDD (major depressive disorder), recurrent episode, moderate ([place])   . Medicare annual wellness visit, subsequent  06/09/2011  . UNSTEADY GAIT 04/16/2010  . Vitamin B12 deficiency 11/01/2008  . Vitamin D deficiency 11/01/2008  . Essential tremor 09/27/2008  . HSV 03/08/2007  . Thrombocytopenia ([place]) 03/08/2007  . Coronary atherosclerosis 03/08/2007  . CKD (chronic kidney disease) stage 3, GFR 30-59 ml/min 03/08/2007  . Urge incontinence 03/08/2007  . RENAL CALCULUS, HX OF 03/08/2007  . THROMBOPHLEBITIS 01/04/2007  . Personal history of pulmonary embolism 09/08/1990    Past Surgical History:  Procedure Laterality Date  . APPENDECTOMY  1996  . CARDIAC CATHETERIZATION  2000   30% one vessel  . CARDIOVASCULAR STRESS TEST  03/2015   no ischemia, low risk, EF 76%  . CAROTID U/S  12/28/2007   nml  . CATARACT EXTRACTION  09/2009   bilateral  . COLONOSCOPY  1998   [place]  . COLONOSCOPY  07/2010   5 polyps, adenomatous, rec rpt 3 yrs  . COLONOSCOPY  04/2014   3 polyps, adenomatous, f/u open ended given age [name]  . CT ABD W & PELVIS WO CM  07/2001   Scarring of right lung, stable negative o/w  . CT ABD W & PELVIS WO CM  11/2000   ? stones, right LL scarring  . CYSTOSCOPY  1992   for kidney stones  . EEG  11/12/2009   nml  . ESOPHAGOGASTRODUODENOSCOPY  02/2012   dilation of schatzki's ring [name]  . ESOPHAGOGASTRODUODENOSCOPY N/A 11/15/2015   Procedure: ESOPHAGOGASTRODUODENOSCOPY (EGD);  Surgeon: Gatha Mayer, MD;  Location: [facility] ENDOSCOPY;  Service: Endoscopy;  Laterality: N/A;  . [place]   with incidental appendectomy  . HERNIA REPAIR  1979   Left  . HIP PINNING,CANNULATED Left 03/11/2016   Procedure: CANNULATED HIP PINNING;  Surgeon: Rod Can, MD;  Location: [facility];  Service: Orthopedics;  Laterality: Left;  . KNEE ARTHROSCOPY  03/24/02   Right (Dr. Mauri Pole)  . MRI  11/2009   Head, nml  . ORIF FEMORAL NECK FRACTURE W/ DHS Right 08/03/2002   Dr Mauri Pole  . TONSILLECTOMY  1965  . US ECHOCARDIOGRAPHY  12/28/2007   Mild aortic valve calcification EF 55%,  basically nml  . V/Q SCAN  06/1999   negative       Home Medications    Prior to Admission medications   Medication Sig Start Date End Date Taking? Authorizing Provider  albuterol (PROVENTIL HFA;VENTOLIN HFA) 108 (90 Base) MCG/ACT inhaler Inhale 2 puffs into the lungs every 6 (six) hours as needed for wheezing or shortness of breath. 01/10/16  Yes Ria Bush, MD  aspirin EC 325 MG tablet Take 1 tablet (325 mg total) by mouth daily. 05/16/16  Yes Ria Bush, MD  b complex vitamins tablet Take 1 tablet by mouth daily.   Yes Historical Provider, MD  Biotin 2500 MCG CAPS Take 1 capsule by mouth daily.   Yes Historical Provider, MD  Cholecalciferol (VITAMIN D) 2000 UNITS CAPS Take 1 capsule by mouth daily. Reported  on 01/30/2016   Yes Historical Provider, MD  clotrimazole (LOTRIMIN) 1 % cream Apply 1 application topically 2 (two) times daily. Patient taking differently: Apply 1 application topically 2 (two) times daily as needed. For rash 05/02/15  Yes Pleas Koch, NP  Coenzyme Q10 (COQ-10) 100 MG CAPS Take 1 capsule by mouth daily.    Yes Historical Provider, MD  Cyanocobalamin (B-12 PO) Take 10,000 mcg by mouth every Monday, Wednesday, and Friday.    Yes Historical Provider, MD  dexlansoprazole (DEXILANT) 60 MG capsule Take 1 capsule (60 mg total) by mouth daily. 01/10/16  Yes Ria Bush, MD  docusate sodium (COLACE) 100 MG capsule Take 100 mg by mouth at bedtime.    Yes Historical Provider, MD  fenofibrate (TRICOR) 145 MG tablet Take 1 tablet (145 mg total) by mouth daily. 01/10/16  Yes Ria Bush, MD  fexofenadine (ALLEGRA) 180 MG tablet Take 180 mg by mouth daily.    Yes Historical Provider, MD  gabapentin (NEURONTIN) 100 MG capsule Take 2 capsules (200 mg total) by mouth 3 (three) times daily. Patient taking differently: Take 200-300 mg by mouth See admin instructions. Take 300 mg every morning and 200 mg every night. 09/21/15  Yes Kathrynn Ducking, MD  mirabegron ER  (MYRBETRIQ) 25 MG TB24 tablet Take 25 mg by mouth daily.   Yes Historical Provider, MD  Multiple Vitamins-Minerals (MULTIVITAMIN PO) Take 1 tablet by mouth daily.    Yes Historical Provider, MD  simvastatin (ZOCOR) 40 MG tablet Take 1 tablet (40 mg total) by mouth daily. 01/10/16  Yes Ria Bush, MD  venlafaxine [facility]) 75 MG tablet Take 1 tablet (75 mg total) by mouth 2 (two) times daily with a meal. 01/10/16  Yes Ria Bush, MD    Family History Family History  Problem Relation Age of Onset  . Stroke Mother   . Hypertension Mother   . Cancer Brother     prostate with mets  . Blindness Brother     legally  . Diabetes Brother   . Pulmonary embolism Sister     from shoulder operation  . Alcohol abuse Brother   . Colon cancer Neg Hx   . Esophageal cancer Neg Hx   . Rectal cancer Neg Hx   . Stomach cancer Neg Hx     Social History Social History  Substance Use Topics  . Smoking status: Never Smoker  . Smokeless tobacco: Never Used  . Alcohol use No     Allergies   Lactose intolerance (gi); Nizatidine; Sulfadiazine; Aricept [donepezil hcl]; Lipitor [atorvastatin]; and Penicillins   Review of Systems Review of Systems  Constitutional: Negative for chills and fever.  HENT: Negative for congestion and facial swelling.   Eyes: Negative for discharge and visual disturbance.  Respiratory: Negative for shortness of breath.   Cardiovascular: Negative for chest pain and palpitations.  Gastrointestinal: Negative for abdominal pain, diarrhea and vomiting.  Musculoskeletal: Positive for arthralgias, myalgias and neck pain.  Skin: Positive for wound. Negative for color change and rash.  Neurological: Positive for headaches. Negative for tremors and syncope.  Psychiatric/Behavioral: Negative for confusion and dysphoric mood.     Physical Exam Updated Vital Signs BP 133/65   Pulse 66   Temp 98.6 F (37 C) (Oral)   Resp 14   Ht 5\' 6"  (1.676 m)   Wt 170 lb (77.1 kg)    SpO2 100%   BMI 27.44 kg/m   Physical Exam  Constitutional: He is oriented to person, place, and  time. He appears well-developed and well-nourished.  HENT:  Head: Normocephalic and atraumatic.  Tender palpation about the right mastoid process. Able to rotate his head 45 in either direction.  Eyes: EOM are normal. Pupils are equal, round, and reactive to light.  Neck: Normal range of motion. Neck supple. No JVD present.  Cardiovascular: Normal rate and regular rhythm.  Exam reveals no gallop and no friction rub.   No murmur heard. Pulmonary/Chest: No respiratory distress. He has no wheezes.  Abdominal: He exhibits no distension and no mass. There is no tenderness. There is no rebound and no guarding.  Musculoskeletal: Normal range of motion. He exhibits tenderness (tender palpation about the lateral aspect of the right leg. Just above the greater trochanter. Patient with some tenderness about the right upper chest wall. Also has some tenderness to the ulnar aspect of the right distal forearm in the right elbow. ).  Skin tear with avulsion to the R elbow   Neurological: He is alert and oriented to person, place, and time.  Skin: No rash noted. No pallor.  Psychiatric: He has a normal mood and affect. His behavior is normal.  Nursing note and vitals reviewed.    ED Treatments / Results  Labs (all labs ordered are listed, but only abnormal results are displayed) Labs Reviewed - No data to display  EKG  EKG Interpretation None       Radiology Dg Ribs Unilateral W/chest Right  Result Date: 07/08/2016 CLINICAL DATA:  [place] today at home.  Right rib pain. EXAM: RIGHT RIBS AND CHEST - 3+ VIEW COMPARISON:  Chest x-ray 03/29/2015 FINDINGS: The cardiac silhouette, mediastinal and hilar contours are within normal limits and stable given the AP projection and portable technique. No acute pulmonary findings. No pleural effusion or pneumothorax. Dedicated views of the right ribs  demonstrate posterior eighth and ninth rib fractures with minimal displacement. Remote healed left rib fractures are noted. IMPRESSION: 1. No acute cardiopulmonary findings. 2. Right eighth and ninth rib fractures. Electronically Signed   By: Marijo Sanes M.D.   On: 07/08/2016 15:34   Dg Lumbar Spine Complete  Result Date: 07/08/2016 CLINICAL DATA:  [place] today at home.  Back pain. EXAM: LUMBAR SPINE - COMPLETE 4+ VIEW COMPARISON:  CT scan 03/10/2016 FINDINGS: Advanced degenerative lumbar spondylosis with large common near bridging marginal osteophytes and moderate facet disease. No acute fracture. The visualized bony pelvis is intact. IMPRESSION: Normal alignment and no acute bony findings. Overall stable degenerative changes. Electronically Signed   By: Marijo Sanes M.D.   On: 07/08/2016 15:39   Dg Elbow Complete Right  Result Date: 07/08/2016 CLINICAL DATA:  [place] today at home.  Right elbow pain. EXAM: RIGHT ELBOW - COMPLETE 3+ VIEW COMPARISON:  None. FINDINGS: The joint spaces are maintained. Minimal degenerative changes. Extensive vascular calcifications. No acute fracture or joint effusion. IMPRESSION: No acute fracture or joint effusion. Electronically Signed   By: Marijo Sanes M.D.   On: 07/08/2016 15:35   Dg Wrist Complete Right  Result Date: 07/08/2016 CLINICAL DATA:  [place] today at home.  Right wrist pain. EXAM: RIGHT WRIST - COMPLETE 3+ VIEW COMPARISON:  None. FINDINGS: Extensive vascular calcifications are noted. There are mild degenerative changes involving the hand and wrist. No acute fractures identified. IMPRESSION: No acute fracture. Electronically Signed   By: Marijo Sanes M.D.   On: 07/08/2016 15:35   Ct Head Wo Contrast  Result Date: 07/08/2016 CLINICAL DATA:  [place] today at home. Right posterior headache. Initial  encounter. EXAM: CT HEAD WITHOUT CONTRAST CT CERVICAL SPINE WITHOUT CONTRAST TECHNIQUE: Multidetector CT imaging of the head and cervical spine was performed  following the standard protocol without intravenous contrast. Multiplanar CT image reconstructions of the cervical spine were also generated. COMPARISON:  Head and cervical spine CT 03/10/2016. Cervical spine MRI 05/07/2016. FINDINGS: CT HEAD FINDINGS Brain: There is no evidence of acute cortical infarct, intracranial hemorrhage, mass, midline shift, or extra-axial fluid collection. Incidental cavum septum pellucidum et vergae. Unchanged mild-to-moderate cerebral atrophy. Unchanged periventricular white matter hypodensities compatible with moderate chronic small vessel ischemic disease. Vascular: Calcified atherosclerosis at the skullbase. Skull: No fracture or suspicious osseous lesion. Sinuses/Orbits: At most minimal mucosal thickening in the ethmoid sinuses. Clear mastoid air cells. Prior bilateral cataract extraction. Other: None. CT CERVICAL SPINE FINDINGS Alignment: Normal. Skull base and vertebrae: No evidence of acute fracture. Mild anterior C7 vertebral body height loss is unchanged. Bulky flowing anterior vertebral ossification extends from C2 to T1 compatible with diffuse idiopathic skeletal hyperostosis. Soft tissues and spinal canal: No prevertebral fluid or swelling. No visible canal hematoma. Disc levels: Cervical intervertebral disc space heights are preserved. Upper chest: Clear lung apices. Other: None. IMPRESSION: 1. No evidence of acute intracranial abnormality. 2. Moderate chronic small vessel ischemic disease. 3. No evidence of acute cervical spine fracture. Electronically Signed   By: Logan Bores M.D.   On: 07/08/2016 15:41   Ct Cervical Spine Wo Contrast  Result Date: 07/08/2016 CLINICAL DATA:  Fall today at home. Right posterior headache. Initial encounter. EXAM: CT HEAD WITHOUT CONTRAST CT CERVICAL SPINE WITHOUT CONTRAST TECHNIQUE: Multidetector CT imaging of the head and cervical spine was performed following the standard protocol without intravenous contrast. Multiplanar CT image  reconstructions of the cervical spine were also generated. COMPARISON:  Head and cervical spine CT 03/10/2016. Cervical spine MRI 05/07/2016. FINDINGS: CT HEAD FINDINGS Brain: There is no evidence of acute cortical infarct, intracranial hemorrhage, mass, midline shift, or extra-axial fluid collection. Incidental cavum septum pellucidum et vergae. Unchanged mild-to-moderate cerebral atrophy. Unchanged periventricular white matter hypodensities compatible with moderate chronic small vessel ischemic disease. Vascular: Calcified atherosclerosis at the skullbase. Skull: No fracture or suspicious osseous lesion. Sinuses/Orbits: At most minimal mucosal thickening in the ethmoid sinuses. Clear mastoid air cells. Prior bilateral cataract extraction. Other: None. CT CERVICAL SPINE FINDINGS Alignment: Normal. Skull base and vertebrae: No evidence of acute fracture. Mild anterior C7 vertebral body height loss is unchanged. Bulky flowing anterior vertebral ossification extends from C2 to T1 compatible with diffuse idiopathic skeletal hyperostosis. Soft tissues and spinal canal: No prevertebral fluid or swelling. No visible canal hematoma. Disc levels: Cervical intervertebral disc space heights are preserved. Upper chest: Clear lung apices. Other: None. IMPRESSION: 1. No evidence of acute intracranial abnormality. 2. Moderate chronic small vessel ischemic disease. 3. No evidence of acute cervical spine fracture. Electronically Signed   By: Logan Bores M.D.   On: 07/08/2016 15:41   Dg Hip Unilat W Or Wo Pelvis 2-3 Views Right  Result Date: 07/08/2016 CLINICAL DATA:  [place] today at home. Right hip pain. History of prior hip fracture. EXAM: DG HIP (WITH OR WITHOUT PELVIS) 2-3V RIGHT COMPARISON:  Radiographs 05/30/2016 FINDINGS: Stable surgical changes from bilateral hip fracture fixation with cannulated hip screws. There are 4 screws on the right and 4 screws on the left. Moderate shortening of the femoral necks but no definite  acute fracture. No findings for avascular necrosis. Moderate hip joint degenerative changes bilaterally, right greater than left. The pubic symphysis  and SI joints are intact. No definite pelvic fractures. IMPRESSION: Remote postsurgical changes involving both hips but no complicating features associated with the hardware or new/acute fracture. Bilateral hip joint degenerative changes, right greater than left. No acute bony findings involving the pelvis. Electronically Signed   By: Marijo Sanes M.D.   On: 07/08/2016 15:37    Procedures Procedures (including critical care time)  Medications Ordered in ED Medications  acetaminophen (TYLENOL) tablet 1,000 mg (1,000 mg Oral Given 07/08/16 1527)     Initial Impression / Assessment and Plan / ED Course  I have reviewed the triage vital signs and the nursing notes.  Pertinent labs & imaging results that were available during my care of the patient were reviewed by me and considered in my medical decision making (see chart for details).  Clinical Course    82 yo M With a chief complaint of a fall. [place] mechanical in nature. Patient has multiple areas of tenderness will obtain plain films. CT head and C spine due to mastoid pain.  Patient found to have right-sided rib fractures on chest x-ray. His pain is well-controlled is not hypoxic or tachypnea. Discussed results with the family who would like to take the patient home. They have her prescription and pain medicine if needed at home he also have an incentive spirometer.   4:23 PM:  I have discussed the diagnosis/risks/treatment options with the patient and family and believe the pt to be eligible for discharge home to follow-up with PCP. We also discussed returning to the ED immediately if new or worsening sx occur. We discussed the sx which are most concerning (e.g., sudden worsening sob, fever) that necessitate immediate return. Medications administered to the patient during their visit and any  new prescriptions provided to the patient are listed below.  Medications given during this visit Medications  acetaminophen (TYLENOL) tablet 1,000 mg (1,000 mg Oral Given 07/08/16 1527)     The patient appears reasonably screen and/or stabilized for discharge and I doubt any other medical condition or other [facility] requiring further screening, evaluation, or treatment in the ED at this time prior to discharge.    Final Clinical Impressions(s) / ED Diagnoses   Final diagnoses:  Closed fracture of multiple ribs of right side, initial encounter    New Prescriptions New Prescriptions   No medications on file     Deno Etienne, DO 07/08/16 1623

## 2016-07-09 DIAGNOSIS — L578 Other skin changes due to chronic exposure to nonionizing radiation: Secondary | ICD-10-CM | POA: Diagnosis not present

## 2016-07-09 DIAGNOSIS — Z1283 Encounter for screening for malignant neoplasm of skin: Secondary | ICD-10-CM | POA: Diagnosis not present

## 2016-07-09 DIAGNOSIS — I82409 Acute embolism and thrombosis of unspecified deep veins of unspecified lower extremity: Secondary | ICD-10-CM

## 2016-07-09 DIAGNOSIS — D692 Other nonthrombocytopenic purpura: Secondary | ICD-10-CM | POA: Diagnosis not present

## 2016-07-09 DIAGNOSIS — D1800 Hemangioma unspecified site: Secondary | ICD-10-CM | POA: Diagnosis not present

## 2016-07-09 DIAGNOSIS — L812 Freckles: Secondary | ICD-10-CM | POA: Diagnosis not present

## 2016-07-09 DIAGNOSIS — D229 Melanocytic nevi, unspecified: Secondary | ICD-10-CM | POA: Diagnosis not present

## 2016-07-09 HISTORY — DX: Acute embolism and thrombosis of unspecified deep veins of unspecified lower extremity: I82.409

## 2016-07-10 ENCOUNTER — Encounter: Payer: Medicare Other | Admitting: Physical Therapy

## 2016-07-11 ENCOUNTER — Ambulatory Visit (INDEPENDENT_AMBULATORY_CARE_PROVIDER_SITE_OTHER): Payer: Medicare Other | Admitting: Primary Care

## 2016-07-11 ENCOUNTER — Encounter: Payer: Self-pay | Admitting: Primary Care

## 2016-07-11 VITALS — BP 124/64 | HR 84 | Temp 98.1°F | Ht 66.0 in | Wt 163.4 lb

## 2016-07-11 DIAGNOSIS — Z09 Encounter for follow-up examination after completed treatment for conditions other than malignant neoplasm: Secondary | ICD-10-CM

## 2016-07-11 DIAGNOSIS — I639 Cerebral infarction, unspecified: Secondary | ICD-10-CM | POA: Diagnosis not present

## 2016-07-11 NOTE — Progress Notes (Signed)
Pre visit review using our clinic review tool, if applicable. No additional management support is needed unless otherwise documented below in the visit note. 

## 2016-07-11 NOTE — Progress Notes (Signed)
Subjective:    Patient ID: [name], male    DOB: 11/20/1930, 82 y.o.   MRN: [id number]  HPI  [name] is an 82 year old male who presents today for Emergency Department follow up. He presented to [facility] on 07/08/16 with a chief complaint of fall. He is unsteady at baseline, walks with a walker. He believes his walker broke while he was walking, fell on his right side. He reported right sided chest pain, right elbow, hip, and wrist pain. He underwent imaging including xrays and CT with results of: right 8th and 9th rib fracture, imaging otherwise was unremarkable. He was stable during his stay in the ED. He was discharged home later that day with incentive spirometry.   Since his visit to the emergency department he's continued to notice right wrist pain. His pain is worse with pressure. He denies numbness, increased swelling, increased pain. He's been using the incentive spirometry every 2-3 hours. He's been taking Tylenol without much improvement. He does have a prescription for Tramadol at home for which he's not used.   Review of Systems  Constitutional: Negative for fever.  Respiratory: Negative for shortness of breath.   Musculoskeletal: Positive for arthralgias.       Right rib and wrist pain  Skin:       Bruising to right upper extremity since accident, overall improvement  Neurological: Negative for dizziness and headaches.       Past Medical History:  Diagnosis Date  . Allergic rhinitis   . Arthritis   . Asthma    remote  . Blood transfusion 1990's  . CAD (coronary artery disease)    cath 2000 30% single vessel, normal nuclear stress test 12/03/2010, no evidence ischemia  . Candidal urethritis in male 06/15/2015  . CKD (chronic kidney disease) stage 3, GFR 30-59 ml/min    baseline Cr 1.7  . Closed C7 fracture ([place]) 11/06/2014  . DDD (degenerative disc disease) 03/2013   by CT, diffuse multilevel cervical and lumbar spondylosis  . Depression   . DISH (diffuse  idiopathic skeletal hyperostosis) 03/2013   lumbar spine on xray  . Dizziness    multifactorial, s/p PT at [facility] 06/2014 with HEP  . Elevated PSA    previous-normalized (followed by Dr. Jeffie Pollock, rec no repeat unless urinary sxs)  . Essential tremor 09/27/2008   reviewed eval by Dr Jannifer Franklin in chart 2011   . GERD (gastroesophageal reflux disease)    h/o PUD  . Hearing loss 06/2012   eval - rec annual exam  . History of fracture of right hip   . History of phlebitis   . History of pyelonephritis 05/2014   hospitalization with sepsis  . HLD (hyperlipidemia)    hypertriglyceridemia  . Hx pulmonary embolism 08/1991   negative hypercoagulable panel 12/2014  . Macrocytic anemia 2014   stable B12/folate and periph smear 05/2013, again periph smear 2017 with mature cells, mild dacrocytosis  . Osteoporosis 11/2013   DEXA T score -2.7  . Prediabetes   . Schatzki's ring 02/2012   s/p dilation [name]  . Sleep apnea    no CPAP- sleep apnea "cleared up 15 years ago"  . Stroke ([place])   . Urge incontinence   . UTI (urinary tract infection) due to Enterococcus 05/13/2015     Social History   Social History  . Marital status: Married    Spouse name: N/A  . Number of children: 2  . Years of education: N/A  Occupational History  . 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 Smoking status: Never Smoker  . Smokeless tobacco: Never Used  . Alcohol use No  . Drug use: No  . Sexual activity: Not on file   Other Topics Concern  . Not on file   Social History Narrative   Married with 2 children   Husband of Albon Bogdanski    Retired: was principal    Activity: walks dog 2-3 times daily about 75min, frequent stops    Diet: some water, good fruits/vegetables, fish 1x/wk, no sodas.      Advanced directives: Chauncey Reading is wife, [name]. Does not want prolonged life support if terminal      Patient does not drink caffeine.   Patient is right handed.     Past  Surgical History:  Procedure Laterality Date  . APPENDECTOMY  1996  . CARDIAC CATHETERIZATION  2000   30% one vessel  . CARDIOVASCULAR STRESS TEST  03/2015   no ischemia, low risk, EF 76%  . CAROTID U/S  12/28/2007   nml  . CATARACT EXTRACTION  09/2009   bilateral  . COLONOSCOPY  [place]   [place]  . COLONOSCOPY  07/2010   5 polyps, adenomatous, rec rpt 3 yrs  . COLONOSCOPY  04/2014   3 polyps, adenomatous, f/u open ended given age [name]  . CT ABD W & PELVIS WO CM  07/2001   Scarring of right lung, stable negative o/w  . CT ABD W & PELVIS WO CM  11/2000   ? stones, right LL scarring  . CYSTOSCOPY  1992   for kidney stones  . EEG  11/12/2009   nml  . ESOPHAGOGASTRODUODENOSCOPY  02/2012   dilation of schatzki's ring [name]  . ESOPHAGOGASTRODUODENOSCOPY N/A 11/15/2015   Procedure: ESOPHAGOGASTRODUODENOSCOPY (EGD);  Surgeon: Gatha Mayer, MD;  Location: [facility] ENDOSCOPY;  Service: Endoscopy;  Laterality: N/A;  . [place]   with incidental appendectomy  . HERNIA REPAIR  1979   Left  . HIP PINNING,CANNULATED Left 03/11/2016   Procedure: CANNULATED HIP PINNING;  Surgeon: Rod Can, MD;  Location: [place];  Service: Orthopedics;  Laterality: Left;  . KNEE ARTHROSCOPY  03/24/02   Right (Dr. Mauri Pole)  . MRI  11/2009   Head, nml  . ORIF FEMORAL NECK FRACTURE W/ DHS Right 08/03/2002   Dr Mauri Pole  . TONSILLECTOMY  1965  . US ECHOCARDIOGRAPHY  12/28/2007   Mild aortic valve calcification EF 55%, basically nml  . V/Q SCAN  06/1999   negative    Family History  Problem Relation Age of Onset  . Stroke Mother   . Hypertension Mother   . Cancer Brother     prostate with mets  . Blindness Brother     legally  . Diabetes Brother   . Pulmonary embolism Sister     from shoulder operation  . Alcohol abuse Brother   . Colon cancer Neg Hx   . Esophageal cancer Neg Hx   . Rectal cancer Neg Hx   . Stomach cancer Neg Hx     Allergies  Allergen Reactions  . Lactose  Intolerance (Gi)   . Nizatidine     REACTION: Rash (Axid)  . Sulfadiazine     REACTION: Hives  . Aricept [Donepezil Hcl] Rash  . Lipitor [Atorvastatin] Rash  . Penicillins Rash    Has patient had a PCN reaction causing immediate rash, facial/tongue/throat swelling, SOB or  lightheadedness with hypotension: No Has patient had a PCN reaction causing severe rash involving mucus membranes or skin necrosis: No Has patient had a PCN reaction that required hospitalization No Has patient had a PCN reaction occurring within the last 10 years: No If all of the above answers are "NO",  then may proceed with Cephalosporin use.    Current Outpatient Prescriptions on File Prior to Visit  Medication Sig Dispense Refill  . albuterol (PROVENTIL HFA;VENTOLIN HFA) 108 (90 Base) MCG/ACT inhaler Inhale 2 puffs into the lungs every 6 (six) hours as needed for wheezing or shortness of breath. 3 Inhaler 3  . aspirin EC 325 MG tablet Take 1 tablet (325 mg total) by mouth daily.    Marland Kitchen b complex vitamins tablet Take 1 tablet by mouth daily.    . Biotin 2500 MCG CAPS Take 1 capsule by mouth daily.    . Cholecalciferol (VITAMIN D) 2000 UNITS CAPS Take 1 capsule by mouth daily. Reported on 01/30/2016    . clotrimazole (LOTRIMIN) 1 % cream Apply 1 application topically 2 (two) times daily. (Patient taking differently: Apply 1 application topically 2 (two) times daily as needed. For rash) 30 g 0  . Coenzyme Q10 (COQ-10) 100 MG CAPS Take 1 capsule by mouth daily.     . Cyanocobalamin (B-12 PO) Take 10,000 mcg by mouth every Monday, Wednesday, and Friday.     Marland Kitchen dexlansoprazole (DEXILANT) 60 MG capsule Take 1 capsule (60 mg total) by mouth daily. 90 capsule 2  . docusate sodium (COLACE) 100 MG capsule Take 100 mg by mouth at bedtime.     . fenofibrate (TRICOR) 145 MG tablet Take 1 tablet (145 mg total) by mouth daily. 90 tablet 2  . fexofenadine (ALLEGRA) 180 MG tablet Take 180 mg by mouth daily.     Marland Kitchen gabapentin  (NEURONTIN) 100 MG capsule Take 2 capsules (200 mg total) by mouth 3 (three) times daily. (Patient taking differently: Take 200-300 mg by mouth See admin instructions. Take 300 mg every morning and 200 mg every night.) 540 capsule 1  . Multiple Vitamins-Minerals (MULTIVITAMIN PO) Take 1 tablet by mouth daily.     . simvastatin (ZOCOR) 40 MG tablet Take 1 tablet (40 mg total) by mouth daily. 90 tablet 2  . venlafaxine (EFFEXOR) 75 MG tablet Take 1 tablet (75 mg total) by mouth 2 (two) times daily with a meal. 180 tablet 2  . mirabegron ER (MYRBETRIQ) 25 MG TB24 tablet Take 25 mg by mouth daily.     No current facility-administered medications on file prior to visit.     BP 124/64   Pulse 84   Temp 98.1 F (36.7 C) (Oral)   Ht 5\' 6"  (1.676 m)   Wt 163 lb 6.4 oz (74.1 kg)   SpO2 98%   BMI 26.37 kg/m    Objective:   Physical Exam  Constitutional: He appears well-nourished.  Neck: Neck supple.  Cardiovascular: Normal rate and regular rhythm.   Pulses:      Radial pulses are 2+ on the right side, and 2+ on the left side.  Equal chest rise and fall  Pulmonary/Chest: Effort normal and breath sounds normal.  Equal chest rise and fall  Musculoskeletal:       Right wrist: He exhibits swelling. He exhibits normal range of motion, no tenderness, no bony tenderness, no crepitus and no deformity.  Mild swelling and discomfort to right wrist.  Neurological:  Sensation intact to right upper extremity  Skin: Skin  is warm and dry.  Well healing bruising noted to right wrist. Mild swelling.           Assessment & Plan:  Emergency Department Follow Up:  [place] on 07/08/16. Evaluated in ED and diagnosed with 8th and 9th rib fractures. Overall doing well, using incentive spirometry.  Mild wrist swelling, good ROM, good pulses. Discussed to continue incentive spirometry and explained importance. Will have them try low dose Ibuprofen for inflammation to wrist, may also try Tramadol (Rx at  home) for pain. Fall precautions provided. Follow up PRN.  All hospital imaging, notes reviewed. Sheral Flow, NP

## 2016-07-11 NOTE — Patient Instructions (Signed)
Continue to use the incentive spirometry every 2-3 hours to keep your lungs inflated.   Try low dose Iburpofen (400 mg) every 8 hours as needed for pain and inflammation.  You may take the Tramadol every 8 hours as needed for pain.  Please notify us if your swelling dose not improve, you notice increased pain to your wrist, you develop shortness of breath.  It was a pleasure meeting you!

## 2016-07-15 ENCOUNTER — Ambulatory Visit: Payer: Medicare Other | Admitting: Physical Therapy

## 2016-07-15 ENCOUNTER — Encounter: Payer: Medicare Other | Admitting: Physical Therapy

## 2016-07-17 ENCOUNTER — Encounter: Payer: Self-pay | Admitting: Physical Therapy

## 2016-07-17 ENCOUNTER — Ambulatory Visit: Payer: Medicare Other | Attending: Family Medicine | Admitting: Physical Therapy

## 2016-07-17 DIAGNOSIS — R262 Difficulty in walking, not elsewhere classified: Secondary | ICD-10-CM | POA: Diagnosis not present

## 2016-07-17 DIAGNOSIS — R42 Dizziness and giddiness: Secondary | ICD-10-CM | POA: Insufficient documentation

## 2016-07-17 NOTE — Therapy (Signed)
Dictation #1 [id number]  [id number] [place] MAIN [facility] SERVICES [address] [place], [place], [address] Phone: [phone]   Fax:  [phone]  Physical Therapy Treatment  Patient Details  Name: [name] MRN: [id number] Date of Birth: [birth year] Referring Provider: Ria Bush  Encounter Date: 07/17/2016      PT End of Session - 07/17/16 1156    Visit Number 8   Number of Visits 25   Date for PT Re-Evaluation 09/01/16   Authorization Type g codes   Authorization Time Period 8/10   PT Start Time 1115   PT Stop Time 1158   PT Time Calculation (min) 43 min   Equipment Utilized During Treatment Gait belt   Activity Tolerance Patient tolerated treatment well;No increased pain   Behavior During Therapy WFL for tasks assessed/performed      Past Medical History:  Diagnosis Date  . Allergic rhinitis   . Arthritis   . Asthma    remote  . Blood transfusion 1990's  . CAD (coronary artery disease)    cath 2000 30% single vessel, normal nuclear stress test 12/03/2010, no evidence ischemia  . Candidal urethritis in male 06/15/2015  . CKD (chronic kidney disease) stage 3, GFR 30-59 ml/min    baseline Cr 1.7  . Closed C7 fracture ([place]) 11/06/2014  . DDD (degenerative disc disease) 03/2013   by CT, diffuse multilevel cervical and lumbar spondylosis  . Depression   . DISH (diffuse idiopathic skeletal hyperostosis) 03/2013   lumbar spine on xray  . Dizziness    multifactorial, s/p PT at [facility] 06/2014 with HEP  . Elevated PSA    previous-normalized (followed by Dr. Jeffie Pollock, rec no repeat unless urinary sxs)  . Essential tremor 09/27/2008   reviewed eval by Dr Jannifer Franklin in chart 2011   . GERD (gastroesophageal reflux disease)    h/o PUD  . Hearing loss 06/2012   eval - rec annual exam  . History of fracture of right hip   . History of phlebitis   . History of pyelonephritis 05/2014   hospitalization with sepsis  . HLD  (hyperlipidemia)    hypertriglyceridemia  . Hx pulmonary embolism 08/1991   negative hypercoagulable panel 12/2014  . Macrocytic anemia 2014   stable B12/folate and periph smear 05/2013, again periph smear 2017 with mature cells, mild dacrocytosis  . Osteoporosis 11/2013   DEXA T score -2.7  . Prediabetes   . Schatzki's ring 02/2012   s/p dilation [name]  . Sleep apnea    no CPAP- sleep apnea "cleared up 15 years ago"  . Stroke ([place])   . Urge incontinence   . UTI (urinary tract infection) due to Enterococcus 05/13/2015    Past Surgical History:  Procedure Laterality Date  . APPENDECTOMY  1996  . CARDIAC CATHETERIZATION  2000   30% one vessel  . CARDIOVASCULAR STRESS TEST  03/2015   no ischemia, low risk, EF 76%  . CAROTID U/S  12/28/2007   nml  . CATARACT EXTRACTION  09/2009   bilateral  . COLONOSCOPY  1998   [place]  . COLONOSCOPY  07/2010   5 polyps, adenomatous, rec rpt 3 yrs  . COLONOSCOPY  04/2014   3 polyps, adenomatous, f/u open ended given age [name]  . CT ABD W & PELVIS WO CM  07/2001   Scarring of right lung, stable negative o/w  . CT ABD W & PELVIS WO CM  11/2000   ? stones, right LL scarring  .  CYSTOSCOPY  1992   for kidney stones  . EEG  11/12/2009   nml  . ESOPHAGOGASTRODUODENOSCOPY  02/2012   dilation of schatzki's ring [name]  . ESOPHAGOGASTRODUODENOSCOPY N/A 11/15/2015   Procedure: ESOPHAGOGASTRODUODENOSCOPY (EGD);  Surgeon: Gatha Mayer, MD;  Location: [facility] ENDOSCOPY;  Service: Endoscopy;  Laterality: N/A;  . [place]   with incidental appendectomy  . HERNIA REPAIR  1979   Left  . HIP PINNING,CANNULATED Left 03/11/2016   Procedure: CANNULATED HIP PINNING;  Surgeon: Rod Can, MD;  Location: [place];  Service: Orthopedics;  Laterality: Left;  . KNEE ARTHROSCOPY  03/24/02   Right (Dr. Mauri Pole)  . MRI  11/2009   Head, nml  . ORIF FEMORAL NECK FRACTURE W/ DHS Right 08/03/2002   Dr Mauri Pole  . TONSILLECTOMY  1965  . US  ECHOCARDIOGRAPHY  12/28/2007   Mild aortic valve calcification EF 55%, basically nml  . V/Q SCAN  06/1999   negative    There were no vitals filed for this visit.      Subjective Assessment - 07/17/16 1114    Subjective Pt reports a new fall that resulted in a trip to the ED. Pt fell and hurt his R wrist and R ribs.     Patient is accompained by: Family member   Pertinent History Pt ambulates with RW, rollator, or w/c. He only uses w/c when he is going for appointments.Pt has a severe benign tremor per pt's wife, who reports that his neurologist believes the tremor is the cause of his frequent falls. He is taking gabapentin for this which does not appear to be helping. Pt has had multiple CVAs in the past prior to onset of incr. falls. Pt lives with wife who is responsible for daily care.   Diagnostic tests imaging pre and post surgery and with recent trip to ED when he was concerned that he had injured his surgery.   Patient Stated Goals get stronger and walk better   Currently in Pain? Yes   Pain Score 6    Pain Location Wrist   Pain Orientation Right   Pain Descriptors / Indicators Aching   Pain Type Acute pain      Treatment Reassessed 5 time sit to stand for progress , pt performed in 17 seconds compared to last attempt with UE support decreasing his falls risk  Reassessed TUG for progress, pt performed in 18.84 seconds with RW support which is the same as last attempt  Reassessed 10 m walk for progress, pt performed in 0.72ms compared to last attempt which has improved to a limited community ambulator  Sidestepping circuit with red theraband resistance x 4 laps, 2 HHA, 2 sets x 5 squats at either end and 2 sets x 10 reps of heel raises at each end, min VCs for larger steps, increased upright posture and maintaining forward feet throughout    Bosu step taps, 2 sets x 10 reps, 2 HHA with intermittent one HHA, pt required min VCs to increase hip and knee flexion and increased glut  activation for upright posture   Step taps towards stepping stones standing on purple airex pad, 2 sets x 10 reps, 2HHA, min Vcs to increase upright posture, Min A for stability   Hip flexion marches, 2 sets x 10 reps with red theraband resistance, 2 HHA, pt looses balance by trying to perform with 1HHA  Leg Press, BLEs, #90, 2 sets x 10 reps, min A for foot placement and min VCs  for eccentric control  Leg Press, LLE only, #60, 2 sets x 10 reps, min VCs for slower and more eccentric control  Leg Press, RLE onlly, #60, 2 sets x 10 reps, min VCs to count to 3 for more eccentric control                           PT Education - 07/17/16 1156    Education provided Yes   Education Details icing R wrist and reinforced HEP for home   Person(s) Educated Patient   Methods Explanation;Demonstration;Verbal cues   Comprehension Verbalized understanding;Returned demonstration;Verbal cues required             PT Long Term Goals - 07/17/16 1255      PT LONG TERM GOAL #1   Title Pt will be able to perform sit<>stand safely with no vebal cuing required to decr. risk of falls   Baseline pt requires verbal cuing to avoid pulling up with rollator   Time 6   Period Weeks   Status Partially Met     PT LONG TERM GOAL #2   Title Pt will improve TUG to less than 11 sec. indicating decr. risk for falls   Time 6   Period Weeks   Status Partially Met     PT LONG TERM GOAL #3   Title Pt will participate in further balance assessment   Time 6   Period Weeks   Status Partially Met     PT LONG TERM GOAL #4   Title Pt will perform 5 time sit to stand in less than 15 seconds for more functional mobility.     Time 6   Period Weeks   Status Partially Met     PT LONG TERM GOAL #5   Title Pt will perform 10 m walk in 1.45ms to increase his gait speed to become a full community ambulator.     Time 6   Period Weeks   Status New               Plan - 07/17/16 1253     Clinical Impression Statement Pt continues to make progress towards his functional goals.  His walking speed has improved to 0.79m with use of his RW indicating he is a limited coHydrographic surveyor Pt performed TUG test in 18.84 seconds which is about the same as last attempt.  His 5 time sit to stand improved from 22 seconds to 17 seconds with UE support.  Pt continues to require 2 HHA for any balance activity that requires standing on one foot due to weakness.  Pt would benefit from further skilled PT to work towards decreasing falls risk by increasing Le strength and balance.     Rehab Potential Fair   Clinical Impairments Affecting Rehab Potential medical comorbidities/family support   PT Frequency 2x / week   PT Duration 6 weeks   PT Treatment/Interventions ADLs/Self Care Home Management;Aquatic Therapy;Gait training;Therapeutic activities;Therapeutic exercise;Functional mobility training;Stair training;Balance training;Neuromuscular re-education;Manual techniques;Patient/family education;Passive range of motion;Vestibular   PT Next Visit Plan walking without walker, leg press    Consulted and Agree with Plan of Care Patient;Family member/caregiver      Patient will benefit from skilled therapeutic intervention in order to improve the following deficits and impairments:  Improper body mechanics, Decreased balance, Difficulty walking, Decreased activity tolerance, Decreased coordination, Abnormal gait, Decreased cognition  Visit Diagnosis: Difficulty in walking, not elsewhere classified - Plan: PT plan of  care cert/re-cert  Dizziness and giddiness - Plan: PT plan of care cert/re-cert     Problem List Patient Active Problem List   Diagnosis Date Noted  . Encounter for current long-term use of anticoagulants 04/20/2016  . Left displaced femoral neck fracture ([place]) 03/11/2016  . Dysuria 02/07/2016  . Acute ischemic stroke ([place]) 11/16/2015  . 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  Esophageal stenosis   . CHF (congestive heart failure) ([place]) 11/12/2015  . Recurrent UTI 05/13/2015  . Drug rash 04/09/2015  . Chest pain at rest 03/29/2015  . Pedal edema 02/07/2015  . CN (constipation) 01/15/2015  . Orthostatic hypotension 11/24/2014  . Syncope 11/07/2014  . Advanced care planning/counseling discussion 11/06/2014  . Daytime somnolence 11/06/2014  . Macrocytic anemia   . History of colonic polyps 03/27/2014  . Trigger finger, acquired 01/05/2014  . Osteoporosis 11/06/2013  . Dementia 06/08/2013  . Vertigo 01/28/2013  . Bilateral hearing loss 06/07/2012  . GERD (gastroesophageal reflux disease) 04/30/2012  . Dysphagia 12/30/2011  . HLD (hyperlipidemia) 12/08/2011  . MDD (major depressive disorder), recurrent episode, moderate ([place])   . Medicare annual wellness visit, subsequent 06/09/2011  . UNSTEADY GAIT 04/16/2010  . Vitamin B12 deficiency 11/01/2008  . Vitamin D deficiency 11/01/2008  . Essential tremor 09/27/2008  . HSV 03/08/2007  . Thrombocytopenia ([place]) 03/08/2007  . Coronary atherosclerosis 03/08/2007  . CKD (chronic kidney disease) stage 3, GFR 30-59 ml/min 03/08/2007  . Urge incontinence 03/08/2007  . RENAL CALCULUS, HX OF 03/08/2007  . THROMBOPHLEBITIS 01/04/2007  . Personal history of pulmonary embolism 09/08/1990   Stacy Gardner, SPT  This entire session was performed under direct supervision and direction of a licensed therapist/therapist assistant . I have personally read, edited and approve of the note as written.  Trotter,Margaret PT, DPT 07/17/2016, 3:06 PM  [place] MAIN [facility] SERVICES [address] [place], [place], [address] Phone: [phone]   Fax:  [phone]  Name: [name] MRN: [id number] Date of Birth: 1930-11-28

## 2016-07-22 ENCOUNTER — Encounter: Payer: Self-pay | Admitting: Physical Therapy

## 2016-07-22 ENCOUNTER — Ambulatory Visit: Payer: Medicare Other | Admitting: Physical Therapy

## 2016-07-22 DIAGNOSIS — R42 Dizziness and giddiness: Secondary | ICD-10-CM | POA: Diagnosis not present

## 2016-07-22 DIAGNOSIS — R262 Difficulty in walking, not elsewhere classified: Secondary | ICD-10-CM | POA: Diagnosis not present

## 2016-07-22 NOTE — Therapy (Signed)
[place] MAIN [facility] SERVICES [address] [place], [place], [address] Phone: [phone]   Fax:  [phone]  Physical Therapy Treatment  Patient Details  Name: [name] MRN: [id number] Date of Birth: 1931-08-28 Referring Provider: Ria Bush  Encounter Date: 07/22/2016      PT End of Session - 07/22/16 1448    Visit Number 9   Number of Visits 25   Date for PT Re-Evaluation 09/21/2016   Authorization Type g codes   Authorization Time Period 9/10   PT Start Time 4235   PT Stop Time 1458   PT Time Calculation (min) 43 min   Equipment Utilized During Treatment Gait belt   Activity Tolerance Patient tolerated treatment well;No increased pain   Behavior During Therapy WFL for tasks assessed/performed      Past Medical History:  Diagnosis Date  . Allergic rhinitis   . Arthritis   . Asthma    remote  . Blood transfusion 1990's  . CAD (coronary artery disease)    cath 2000 30% single vessel, normal nuclear stress test 12/03/2010, no evidence ischemia  . Candidal urethritis in male 06/15/2015  . CKD (chronic kidney disease) stage 3, GFR 30-59 ml/min    baseline Cr 1.7  . Closed C7 fracture ([place]) 11/06/2014  . DDD (degenerative disc disease) 03/2013   by CT, diffuse multilevel cervical and lumbar spondylosis  . Depression   . DISH (diffuse idiopathic skeletal hyperostosis) 03/2013   lumbar spine on xray  . Dizziness    multifactorial, s/p PT at [facility] 06/2014 with HEP  . Elevated PSA    previous-normalized (followed by Dr. Jeffie Pollock, rec no repeat unless urinary sxs)  . Essential tremor 09/27/2008   reviewed eval by Dr Jannifer Franklin in chart 2011   . GERD (gastroesophageal reflux disease)    h/o PUD  . Hearing loss 06/2012   eval - rec annual exam  . History of fracture of right hip   . History of phlebitis   . History of pyelonephritis 05/2014   hospitalization with sepsis  . HLD (hyperlipidemia)    hypertriglyceridemia  . Hx  pulmonary embolism 08/1991   negative hypercoagulable panel 12/2014  . Macrocytic anemia 2014   stable B12/folate and periph smear 05/2013, again periph smear 2017 with mature cells, mild dacrocytosis  . Osteoporosis 11/2013   DEXA T score -2.7  . Prediabetes   . Schatzki's ring 02/2012   s/p dilation [name]  . Sleep apnea    no CPAP- sleep apnea "cleared up 15 years ago"  . Stroke ([place])   . Urge incontinence   . UTI (urinary tract infection) due to Enterococcus 05/13/2015    Past Surgical History:  Procedure Laterality Date  . APPENDECTOMY  1996  . CARDIAC CATHETERIZATION  2000   30% one vessel  . CARDIOVASCULAR STRESS TEST  03/2015   no ischemia, low risk, EF 76%  . CAROTID U/S  12/28/2007   nml  . CATARACT EXTRACTION  09/2009   bilateral  . [place]OS[place]  1998   [place]  . COLONOSCOPY  07/2010   5 polyps, adenomatous, rec rpt 3 yrs  . COLONOSCOPY  04/2014   3 polyps, adenomatous, f/u open ended given age [name]  . CT ABD W & PELVIS WO CM  07/2001   Scarring of right lung, stable negative o/w  . CT ABD W & PELVIS WO CM  11/2000   ? stones, right LL scarring  . CYSTOSCOPY  1992  for kidney stones  . EEG  11/12/2009   nml  . ESOPHAGOGASTRODUODENOSCOPY  02/2012   dilation of schatzki's ring [name]  . ESOPHAGOGASTRODUODENOSCOPY N/A 11/15/2015   Procedure: ESOPHAGOGASTRODUODENOSCOPY (EGD);  Surgeon: Gatha Mayer, MD;  Location: [facility] ENDOSCOPY;  Service: Endoscopy;  Laterality: N/A;  . [place]   with incidental appendectomy  . HERNIA REPAIR  1979   Left  . HIP PINNING,CANNULATED Left 03/11/2016   Procedure: CANNULATED HIP PINNING;  Surgeon: Rod Can, MD;  Location: [employer];  Service: Orthopedics;  Laterality: Left;  . KNEE ARTHROSCOPY  03/24/02   Right (Dr. Mauri Pole)  . MRI  11/2009   Head, nml  . ORIF FEMORAL NECK FRACTURE W/ DHS Right 08/03/2002   Dr Mauri Pole  . TONSILLECTOMY  1965  . US ECHOCARDIOGRAPHY  12/28/2007   Mild aortic valve  calcification EF 55%, basically nml  . V/Q SCAN  06/1999   negative    There were no vitals filed for this visit.      Subjective Assessment - 07/22/16 1420    Subjective Pt reports that he has been walking more.  He reports that his wrist is still a little sore from the fall on 07/08/16.   Patient is accompained by: Family member   Pertinent History Pt ambulates with RW, rollator, or w/c. He only uses w/c when he is going for appointments.Pt has a severe benign tremor per pt's wife, who reports that his neurologist believes the tremor is the cause of his frequent falls. He is taking gabapentin for this which does not appear to be helping. Pt has had multiple CVAs in the past prior to onset of incr. falls. Pt lives with wife who is responsible for daily care.   Diagnostic tests imaging pre and post surgery and with recent trip to ED when he was concerned that he had injured his surgery.   Patient Stated Goals get stronger and walk better   Currently in Pain? Yes   Pain Score 4    Pain Location Hip   Pain Orientation Right   Pain Descriptors / Indicators Aching     Treatment Nustep x 4 mins BUEs/BLEs level 2 (unbilled)  Sit to stands from low mat table with yellow weighted ball, 2 sets x 10 reps, min VCS to increase glut activation  Bosu step taps, 2 sets x 10 reps, pt able to perform with LLE with 1HHA but requires 2 HHA to step up with RLE  Sidestepping on airex balance beam x 2 laps, 2 HHA, min VCs to increase step length and perform with greater knee flexion, CGA for safety  Forwards/backwards walking on airex balance beam x 2 laps, 2 HHA, min VCs to increase upright posture  Static balance on airex pad with side to side head turns x 10, min A for stability, min VCs for upright posture   Static balance on airex pad with anterior posterior head turns x 10, min A for safety, min VCS for increased glut activation   Leg Press, BLEs, #105, 2 sets x 10 reps, yellow theraband around thighs  to prevent hip abduction, min VCs for increased eccentric control                             PT Education - 07/22/16 1446    Education provided Yes   Education Details continuation of HEP    Person(s) Educated Patient   Methods Explanation;Demonstration;Verbal cues  Comprehension Verbalized understanding;Returned demonstration;Verbal cues required             PT Long Term Goals - 07/17/16 1255      PT LONG TERM GOAL #1   Title Pt will be able to perform sit<>stand safely with no vebal cuing required to decr. risk of falls   Baseline pt requires verbal cuing to avoid pulling up with rollator   Time 6   Period Weeks   Status Partially Met     PT LONG TERM GOAL #2   Title Pt will improve TUG to less than 11 sec. indicating decr. risk for falls   Time 6   Period Weeks   Status Partially Met     PT LONG TERM GOAL #3   Title Pt will participate in further balance assessment   Time 6   Period Weeks   Status Partially Met     PT LONG TERM GOAL #4   Title Pt will perform 5 time sit to stand in less than 15 seconds for more functional mobility.     Time 6   Period Weeks   Status Partially Met     PT LONG TERM GOAL #5   Title Pt will perform 10 m walk in 1.14ms to increase his gait speed to become a full community ambulator.     Time 6   Period Weeks   Status New               Plan - 07/22/16 1449    Clinical Impression Statement Pt continues to report some R wrist and R hip pain after the fall on 07/08/16.  Today pt demonstrated increased fatigue and required seated rest breaks after each exercise.  He demonstrated increased difficulty getting up and down from chair in waiting room but was able to perform in clinic with yellow weighted ball without assistance from UEs.  Pt continues to demonstrate difficulty lifting RLE off the ground with only 1HHA.    Continued to initate activties that require pt to take larger steps on noncompliant  surface.  He would benefit from further skilled PT to work on gait mechanics, lower extremity strength and balance for more functional mobility.     Rehab Potential Fair   Clinical Impairments Affecting Rehab Potential medical comorbidities/family support   PT Frequency 2x / week   PT Duration 6 weeks   PT Treatment/Interventions ADLs/Self Care Home Management;Aquatic Therapy;Gait training;Therapeutic activities;Therapeutic exercise;Functional mobility training;Stair training;Balance training;Neuromuscular re-education;Manual techniques;Patient/family education;Passive range of motion;Vestibular   PT Next Visit Plan walking without walker, leg press    Consulted and Agree with Plan of Care Patient;Family member/caregiver      Patient will benefit from skilled therapeutic intervention in order to improve the following deficits and impairments:  Improper body mechanics, Decreased balance, Difficulty walking, Decreased activity tolerance, Decreased coordination, Abnormal gait, Decreased cognition  Visit Diagnosis: Difficulty in walking, not elsewhere classified  Dizziness and giddiness     Problem List Patient Active Problem List   Diagnosis Date Noted  . Encounter for current long-term use of anticoagulants 04/20/2016  . Left displaced femoral neck fracture ([place] 03/11/2016  . Dysuria 02/07/2016  . Acute ischemic stroke ([place] 11/16/2015  . 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 Esophageal stenosis   . CHF (congestive heart failure) ([place] 11/12/2015  . Recurrent UTI 05/13/2015  . Drug rash 04/09/2015  . Chest pain at rest 03/29/2015  . Pedal edema 02/07/2015  . CN (constipation) 01/15/2015  . Orthostatic  hypotension 11/24/2014  . Syncope 11/07/2014  . Advanced care planning/counseling discussion 11/06/2014  . Daytime somnolence 11/06/2014  . Macrocytic anemia   . History of colonic polyps 03/27/2014  . Trigger finger, acquired 01/05/2014  . Osteoporosis 11/06/2013  .  Dementia 06/08/2013  . Vertigo 01/28/2013  . Bilateral hearing loss 06/07/2012  . GERD (gastroesophageal reflux disease) 04/30/2012  . Dysphagia 12/30/2011  . HLD (hyperlipidemia) 12/08/2011  . MDD (major depressive disorder), recurrent episode, moderate ([place])   . Medicare annual wellness visit, subsequent 06/09/2011  . UNSTEADY GAIT 04/16/2010  . Vitamin B12 deficiency 11/01/2008  . Vitamin D deficiency 11/01/2008  . Essential tremor 09/27/2008  . HSV 03/08/2007  . Thrombocytopenia ([place]) 03/08/2007  . Coronary atherosclerosis 03/08/2007  . CKD (chronic kidney disease) stage 3, GFR 30-59 ml/min 03/08/2007  . Urge incontinence 03/08/2007  . RENAL CALCULUS, HX OF 03/08/2007  . THROMBOPHLEBITIS 01/04/2007  . Personal history of pulmonary embolism 09/08/1990   Stacy Gardner, SPT  This entire session was performed under direct supervision and direction of a licensed therapist/therapist assistant . I have personally read, edited and approve of the note as written.  Trotter,Margaret  PT, DPT 07/22/2016, 6:05 PM  [place] MAIN [facility] SERVICES [address] [place], [place], [address] Phone: [phone]   Fax:  [phone]  Name: DREYTON ROESSNER MRN: [id number] Date of Birth: October 06, 1930

## 2016-07-24 ENCOUNTER — Ambulatory Visit: Payer: Medicare Other | Admitting: Physical Therapy

## 2016-07-24 ENCOUNTER — Encounter: Payer: Self-pay | Admitting: Physical Therapy

## 2016-07-24 DIAGNOSIS — R262 Difficulty in walking, not elsewhere classified: Secondary | ICD-10-CM

## 2016-07-24 DIAGNOSIS — R42 Dizziness and giddiness: Secondary | ICD-10-CM

## 2016-07-24 NOTE — Therapy (Signed)
[place] MAIN [facility] SERVICES [address] [place], [place], [address] Phone: [phone]   Fax:  [phone]  Physical Therapy Treatment  Patient Details  Name: [name] MRN: [id number] Date of Birth: [birth year] Referring Provider: Ria Bush  Encounter Date: 07/24/2016      PT End of Session - 07/24/16 1402    Visit Number 10   Number of Visits 25   Date for PT Re-Evaluation 09-18-2016   Authorization Type g codes   Authorization Time Period 10/10   PT Start Time 2297   PT Stop Time 1430   PT Time Calculation (min) 35 min   Equipment Utilized During Treatment Gait belt   Activity Tolerance Patient tolerated treatment well;No increased pain   Behavior During Therapy WFL for tasks assessed/performed      Past Medical History:  Diagnosis Date  . Allergic rhinitis   . Arthritis   . Asthma    remote  . Blood transfusion 1990's  . CAD (coronary artery disease)    cath 2000 30% single vessel, normal nuclear stress test 12/03/2010, no evidence ischemia  . Candidal urethritis in male 06/15/2015  . CKD (chronic kidney disease) stage 3, GFR 30-59 ml/min    baseline Cr 1.7  . Closed C7 fracture ([place]) 11/06/2014  . DDD (degenerative disc disease) 03/2013   by CT, diffuse multilevel cervical and lumbar spondylosis  . Depression   . DISH (diffuse idiopathic skeletal hyperostosis) 03/2013   lumbar spine on xray  . Dizziness    multifactorial, s/p PT at [facility] 06/2014 with HEP  . Elevated PSA    previous-normalized (followed by Dr. Jeffie Pollock, rec no repeat unless urinary sxs)  . Essential tremor 09/27/2008   reviewed eval by Dr Jannifer Franklin in chart 2011   . GERD (gastroesophageal reflux disease)    h/o PUD  . Hearing loss 06/2012   eval - rec annual exam  . History of fracture of right hip   . History of phlebitis   . History of pyelonephritis 05/2014   hospitalization with sepsis  . HLD (hyperlipidemia)    hypertriglyceridemia  . Hx  pulmonary embolism 08/1991   negative hypercoagulable panel 12/2014  . Macrocytic anemia 2014   stable B12/folate and periph smear 05/2013, again periph smear 2017 with mature cells, mild dacrocytosis  . Osteoporosis 11/2013   DEXA T score -2.7  . Prediabetes   . Schatzki's ring 02/2012   s/p dilation [name]  . Sleep apnea    no CPAP- sleep apnea "cleared up 15 years ago"  . Stroke ([place])   . Urge incontinence   . UTI (urinary tract infection) due to Enterococcus 05/13/2015    Past Surgical History:  Procedure Laterality Date  . APPENDECTOMY  1996  . CARDIAC CATHETERIZATION  2000   30% one vessel  . CARDIOVASCULAR STRESS TEST  03/2015   no ischemia, low risk, EF 76%  . CAROTID U/S  12/28/2007   nml  . CATARACT EXTRACTION  09/2009   bilateral  . COLONOSCOPY  1998   [place]  . COLONOSCOPY  07/2010   5 polyps, adenomatous, rec rpt 3 yrs  . COLONOSCOPY  04/2014   3 polyps, adenomatous, f/u open ended given age [name]  . CT ABD W & PELVIS WO CM  07/2001   Scarring of right lung, stable negative o/w  . CT ABD W & PELVIS WO CM  11/2000   ? stones, right LL scarring  . CYSTOSCOPY  1992  for kidney stones  . EEG  11/12/2009   nml  . ESOPHAGOGASTRODUODENOSCOPY  02/2012   dilation of schatzki's ring [name]  . ESOPHAGOGASTRODUODENOSCOPY N/A 11/15/2015   Procedure: ESOPHAGOGASTRODUODENOSCOPY (EGD);  Surgeon: Gatha Mayer, MD;  Location: [facility] ENDOSCOPY;  Service: Endoscopy;  Laterality: N/A;  . [place]   with incidental appendectomy  . HERNIA REPAIR  1979   Left  . HIP PINNING,CANNULATED Left 03/11/2016   Procedure: CANNULATED HIP PINNING;  Surgeon: Rod Can, MD;  Location: [place];  Service: Orthopedics;  Laterality: Left;  . KNEE ARTHROSCOPY  03/24/02   Right (Dr. Mauri Pole)  . MRI  11/2009   Head, nml  . ORIF FEMORAL NECK FRACTURE W/ DHS Right 08/03/2002   Dr Mauri Pole  . TONSILLECTOMY  1965  . US ECHOCARDIOGRAPHY  12/28/2007   Mild aortic valve  calcification EF 55%, basically nml  . V/Q SCAN  06/1999   negative    There were no vitals filed for this visit.      Subjective Assessment - 07/24/16 1356    Subjective Pt arrived to PT session 10 minutes late so session was limited.  Reports he is doing his exercises at home every other day.  Denies any falls since his last visit.     Patient is accompained by: Family member   Pertinent History Pt ambulates with RW, rollator, or w/c. He only uses w/c when he is going for appointments.Pt has a severe benign tremor per pt's wife, who reports that his neurologist believes the tremor is the cause of his frequent falls. He is taking gabapentin for this which does not appear to be helping. Pt has had multiple CVAs in the past prior to onset of incr. falls. Pt lives with wife who is responsible for daily care.   Diagnostic tests imaging pre and post surgery and with recent trip to ED when he was concerned that he had injured his surgery.   Patient Stated Goals get stronger and walk better   Currently in Pain? Yes   Pain Score 4    Pain Location Hip   Pain Orientation Right   Pain Descriptors / Indicators Aching   Pain Type Chronic pain        TREATMENT   Therapeutic Exercise:  Practiced sit<>stand, ambulating 15 ft between 2 chairs x8. Cues to back up all the way to the chair and to reach back for armrests when sitting and to lock rollator prior to sitting. When ambulating, cues for upright posture and to keep RW closer.  Seated Bil hip ER/Abd with RTB around knees 2x10  Seated Bil hip Add squeeze with star kickball 2x10  Bosu step taps, 2 sets x 10 reps, pt able to perform with LLE with 1HHA but requires 2 HHA to step up with RLE. Cues for upright posture and forward gaze.   Neuromuscular Rehab:  Sidestepping on airex balance beam x 2 laps, 2 HHA, min VCs to increase step length and perform with greater knee flexion, CGA for safety  Tandem stance with horizontal and vertical head  turns, x1 minute each foot forward and 1HHA             PT Education - 07/24/16 1402    Education provided Yes   Education Details Exercise technique;  Posture; Gait techniques   Person(s) Educated Patient   Methods Explanation;Demonstration;Verbal cues;Tactile cues   Comprehension Verbalized understanding;Returned demonstration;Need further instruction  PT Long Term Goals - 07/17/16 1255      PT LONG TERM GOAL #1   Title Pt will be able to perform sit<>stand safely with no vebal cuing required to decr. risk of falls   Baseline pt requires verbal cuing to avoid pulling up with rollator   Time 6   Period Weeks   Status Partially Met     PT LONG TERM GOAL #2   Title Pt will improve TUG to less than 11 sec. indicating decr. risk for falls   Time 6   Period Weeks   Status Partially Met     PT LONG TERM GOAL #3   Title Pt will participate in further balance assessment   Time 6   Period Weeks   Status Partially Met     PT LONG TERM GOAL #4   Title Pt will perform 5 time sit to stand in less than 15 seconds for more functional mobility.     Time 6   Period Weeks   Status Partially Met     PT LONG TERM GOAL #5   Title Pt will perform 10 m walk in 1.3ms to increase his gait speed to become a full community ambulator.     Time 6   Period Weeks   Status New               Plan - 111/23/171422    Clinical Impression Statement Pt required several seated rest breaks between exercises but otherwise tolerated all interventions.  He has most difficulty with upright posture, proper use of rollator, and safe technique with sit<>stand which improved with practice ambulating today along with mod verbal cues.  He will benefit from continued skilled PT to improve strength and balance.   Rehab Potential Fair   Clinical Impairments Affecting Rehab Potential medical comorbidities/family support   PT Frequency 2x / week   PT Duration 6 weeks   PT  Treatment/Interventions ADLs/Self Care Home Management;Aquatic Therapy;Gait training;Therapeutic activities;Therapeutic exercise;Functional mobility training;Stair training;Balance training;Neuromuscular re-education;Manual techniques;Patient/family education;Passive range of motion;Vestibular   PT Next Visit Plan walking without walker, leg press    Consulted and Agree with Plan of Care Patient;Family member/caregiver      Patient will benefit from skilled therapeutic intervention in order to improve the following deficits and impairments:  Improper body mechanics, Decreased balance, Difficulty walking, Decreased activity tolerance, Decreased coordination, Abnormal gait, Decreased cognition  Visit Diagnosis: Difficulty in walking, not elsewhere classified  Dizziness and giddiness       G-Codes - 111/23/171412    Functional Assessment Tool Used Clinical Judgement, Gait ability   Functional Limitation Mobility: Walking and moving around   Mobility: Walking and Moving Around Current Status (([id number] At least 60 percent but less than 80 percent impaired, limited or restricted   Mobility: Walking and Moving Around Goal Status [id number] At least 20 percent but less than 40 percent impaired, limited or restricted      Problem List Patient Active Problem List   Diagnosis Date Noted  . Encounter for current long-term use of anticoagulants 04/20/2016  . Left displaced femoral neck fracture ([place] 03/11/2016  . Dysuria 02/07/2016  . Acute ischemic stroke ([place] 11/16/2015  . 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 Esophageal stenosis   . CHF (congestive heart failure) ([place] 11/12/2015  . Recurrent UTI 05/13/2015  . Drug rash 04/09/2015  . Chest pain at rest 03/29/2015  . Pedal edema 02/07/2015  . CN (constipation) 01/15/2015  . Orthostatic hypotension  11/24/2014  . Syncope 11/07/2014  . Advanced care planning/counseling discussion 11/06/2014  . Daytime somnolence 11/06/2014  . Macrocytic  anemia   . History of colonic polyps 03/27/2014  . Trigger finger, acquired 01/05/2014  . Osteoporosis 11/06/2013  . Dementia 06/08/2013  . Vertigo 01/28/2013  . Bilateral hearing loss 06/07/2012  . GERD (gastroesophageal reflux disease) 04/30/2012  . Dysphagia 12/30/2011  . HLD (hyperlipidemia) 12/08/2011  . MDD (major depressive disorder), recurrent episode, moderate ([place])   . Medicare annual wellness visit, subsequent 06/09/2011  . UNSTEADY GAIT 04/16/2010  . Vitamin B12 deficiency 11/01/2008  . Vitamin D deficiency 11/01/2008  . Essential tremor 09/27/2008  . HSV 03/08/2007  . Thrombocytopenia ([place]) 03/08/2007  . Coronary atherosclerosis 03/08/2007  . CKD (chronic kidney disease) stage 3, GFR 30-59 ml/min 03/08/2007  . Urge incontinence 03/08/2007  . RENAL CALCULUS, HX OF 03/08/2007  . THROMBOPHLEBITIS 01/04/2007  . Personal history of pulmonary embolism 09/08/1990    Collie Siad PT, DPT 07/24/2016, 2:31 PM  [place] MAIN [facility] SERVICES [address] [place], [place], [address] Phone: [phone]   Fax:  [phone]  Name: GIORDAN FORDHAM MRN: [id number] Date of Birth: 1931-01-09

## 2016-07-29 ENCOUNTER — Encounter: Payer: Self-pay | Admitting: Physical Therapy

## 2016-07-29 ENCOUNTER — Ambulatory Visit: Payer: Medicare Other | Admitting: Physical Therapy

## 2016-07-29 DIAGNOSIS — R42 Dizziness and giddiness: Secondary | ICD-10-CM | POA: Diagnosis not present

## 2016-07-29 DIAGNOSIS — R262 Difficulty in walking, not elsewhere classified: Secondary | ICD-10-CM | POA: Diagnosis not present

## 2016-07-29 NOTE — Therapy (Signed)
[place] MAIN [facility] SERVICES [address] [place], [place], [address] Phone: [phone]   Fax:  [phone]  Physical Therapy Treatment  Patient Details  Name: [name] MRN: [id number] Date of Birth: 11-26-1930 Referring Provider: Ria Bush  Encounter Date: 07/29/2016      PT End of Session - 07/29/16 1551    Visit Number 11   Number of Visits 25   Date for PT Re-Evaluation 09/13/2016   Authorization Type g codes   Authorization Time Period 1/10   PT Start Time 1345   PT Stop Time 1430   PT Time Calculation (min) 45 min   Equipment Utilized During Treatment Gait belt   Activity Tolerance Patient limited by pain   Behavior During Therapy [facility] for tasks assessed/performed      Past Medical History:  Diagnosis Date  . Allergic rhinitis   . Arthritis   . Asthma    remote  . Blood transfusion 1990's  . CAD (coronary artery disease)    cath 2000 30% single vessel, normal nuclear stress test 12/03/2010, no evidence ischemia  . Candidal urethritis in male 06/15/2015  . CKD (chronic kidney disease) stage 3, GFR 30-59 ml/min    baseline Cr 1.7  . Closed C7 fracture ([place]) 11/06/2014  . DDD (degenerative disc disease) 03/2013   by CT, diffuse multilevel cervical and lumbar spondylosis  . Depression   . DISH (diffuse idiopathic skeletal hyperostosis) 03/2013   lumbar spine on xray  . Dizziness    multifactorial, s/p PT at [facility] 06/2014 with HEP  . Elevated PSA    previous-normalized (followed by Dr. Jeffie Pollock, rec no repeat unless urinary sxs)  . Essential tremor 09/27/2008   reviewed eval by Dr Jannifer Franklin in chart 2011   . GERD (gastroesophageal reflux disease)    h/o PUD  . Hearing loss 06/2012   eval - rec annual exam  . History of fracture of right hip   . History of phlebitis   . History of pyelonephritis 05/2014   hospitalization with sepsis  . HLD (hyperlipidemia)    hypertriglyceridemia  . Hx pulmonary embolism 08/1991    negative hypercoagulable panel 12/2014  . Macrocytic anemia 2014   stable B12/folate and periph smear 05/2013, again periph smear 2017 with mature cells, mild dacrocytosis  . Osteoporosis 11/2013   DEXA T score -2.7  . Prediabetes   . Schatzki's ring 02/2012   s/p dilation [name]  . Sleep apnea    no CPAP- sleep apnea "cleared up 15 years ago"  . Stroke ([place])   . Urge incontinence   . UTI (urinary tract infection) due to Enterococcus 05/13/2015    Past Surgical History:  Procedure Laterality Date  . APPENDECTOMY  1996  . CARDIAC CATHETERIZATION  2000   30% one vessel  . CARDIOVASCULAR STRESS TEST  03/2015   no ischemia, low risk, EF 76%  . CAROTID U/S  12/28/2007   nml  . CATARACT EXTRACTION  09/2009   bilateral  . COLONOSCOPY  1998   [place]  . COLONOSCOPY  07/2010   5 polyps, adenomatous, rec rpt 3 yrs  . COLONOSCOPY  04/2014   3 polyps, adenomatous, f/u open ended given age [name]  . CT ABD W & PELVIS WO CM  07/2001   Scarring of right lung, stable negative o/w  . CT ABD W & PELVIS WO CM  11/2000   ? stones, right LL scarring  . CYSTOSCOPY  1992  [place] MAIN [facility] SERVICES [address] [place], [place], [address] Phone: [phone]   Fax:  [phone]  Physical Therapy Treatment  Patient Details  Name: [name] MRN: [id number] Date of Birth: 11-26-1930 Referring Provider: Ria Bush  Encounter Date: 07/29/2016      PT End of Session - 07/29/16 1551    Visit Number 11   Number of Visits 25   Date for PT Re-Evaluation 09/13/2016   Authorization Type g codes   Authorization Time Period 1/10   PT Start Time 1345   PT Stop Time 1430   PT Time Calculation (min) 45 min   Equipment Utilized During Treatment Gait belt   Activity Tolerance Patient limited by pain   Behavior During Therapy [facility] for tasks assessed/performed      Past Medical History:  Diagnosis Date  . Allergic rhinitis   . Arthritis   . Asthma    remote  . Blood transfusion 1990's  . CAD (coronary artery disease)    cath 2000 30% single vessel, normal nuclear stress test 12/03/2010, no evidence ischemia  . Candidal urethritis in male 06/15/2015  . CKD (chronic kidney disease) stage 3, GFR 30-59 ml/min    baseline Cr 1.7  . Closed C7 fracture ([place]) 11/06/2014  . DDD (degenerative disc disease) 03/2013   by CT, diffuse multilevel cervical and lumbar spondylosis  . Depression   . DISH (diffuse idiopathic skeletal hyperostosis) 03/2013   lumbar spine on xray  . Dizziness    multifactorial, s/p PT at [facility] 06/2014 with HEP  . Elevated PSA    previous-normalized (followed by Dr. Jeffie Pollock, rec no repeat unless urinary sxs)  . Essential tremor 09/27/2008   reviewed eval by Dr Jannifer Franklin in chart 2011   . GERD (gastroesophageal reflux disease)    h/o PUD  . Hearing loss 06/2012   eval - rec annual exam  . History of fracture of right hip   . History of phlebitis   . History of pyelonephritis 05/2014   hospitalization with sepsis  . HLD (hyperlipidemia)    hypertriglyceridemia  . Hx pulmonary embolism 08/1991    negative hypercoagulable panel 12/2014  . Macrocytic anemia 2014   stable B12/folate and periph smear 05/2013, again periph smear 2017 with mature cells, mild dacrocytosis  . Osteoporosis 11/2013   DEXA T score -2.7  . Prediabetes   . Schatzki's ring 02/2012   s/p dilation [name]  . Sleep apnea    no CPAP- sleep apnea "cleared up 15 years ago"  . Stroke ([place])   . Urge incontinence   . UTI (urinary tract infection) due to Enterococcus 05/13/2015    Past Surgical History:  Procedure Laterality Date  . APPENDECTOMY  1996  . CARDIAC CATHETERIZATION  2000   30% one vessel  . CARDIOVASCULAR STRESS TEST  03/2015   no ischemia, low risk, EF 76%  . CAROTID U/S  12/28/2007   nml  . CATARACT EXTRACTION  09/2009   bilateral  . COLONOSCOPY  1998   [place]  . COLONOSCOPY  07/2010   5 polyps, adenomatous, rec rpt 3 yrs  . COLONOSCOPY  04/2014   3 polyps, adenomatous, f/u open ended given age [name]  . CT ABD W & PELVIS WO CM  07/2001   Scarring of right lung, stable negative o/w  . CT ABD W & PELVIS WO CM  11/2000   ? stones, right LL scarring  . CYSTOSCOPY  1992  11/07/2014  . Advanced care planning/counseling discussion 11/06/2014  . Daytime somnolence 11/06/2014  . Macrocytic anemia   . History of colonic  polyps 03/27/2014  . Trigger finger, acquired 01/05/2014  . Osteoporosis 11/06/2013  . Dementia 06/08/2013  . Vertigo 01/28/2013  . Bilateral hearing loss 06/07/2012  . GERD (gastroesophageal reflux disease) 04/30/2012  . Dysphagia 12/30/2011  . HLD (hyperlipidemia) 12/08/2011  . MDD (major depressive disorder), recurrent episode, moderate ([place])   . Medicare annual wellness visit, subsequent 06/09/2011  . UNSTEADY GAIT 04/16/2010  . Vitamin B12 deficiency 11/01/2008  . Vitamin D deficiency 11/01/2008  . Essential tremor 09/27/2008  . HSV 03/08/2007  . Thrombocytopenia ([place]) 03/08/2007  . Coronary atherosclerosis 03/08/2007  . CKD (chronic kidney disease) stage 3, GFR 30-59 ml/min 03/08/2007  . Urge incontinence 03/08/2007  . RENAL CALCULUS, HX OF 03/08/2007  . THROMBOPHLEBITIS 01/04/2007  . Personal history of pulmonary embolism 09/08/1990    Prakash Kimberling PT, DPT 07/29/2016, 3:55 PM  [place] MAIN [facility] SERVICES [address] [place], [place], [address] Phone: [phone]   Fax:  [phone]  Name: JASAN DOUGHTIE MRN: [id number] Date of Birth: 01/13/1931  [place] MA[facility] SERVICES [address] [place], [place], [address] Phone: [phone]   Fax:  [phone]  Physical Therapy Treatment  Patient Details  Name: [name] MRN: [id number] Date of Birth: 11-26-1930 Referring Provider: Ria Bush  Encounter Date: 07/29/2016      PT End of Session - 07/29/16 1551    Visit Number 11   Number of Visits 25   Date for PT Re-Evaluation 09/13/2016   Authorization Type g codes   Authorization Time Period 1/10   PT Start Time 1345   PT Stop Time 1430   PT Time Calculation (min) 45 min   Equipment Utilized During Treatment Gait belt   Activity Tolerance Patient limited by pain   Behavior During Therapy [facility] for tasks assessed/performed      Past Medical History:  Diagnosis Date  . Allergic rhinitis   . Arthritis   . Asthma    remote  . Blood transfusion 1990's  . CAD (coronary artery disease)    cath 2000 30% single vessel, normal nuclear stress test 12/03/2010, no evidence ischemia  . Candidal urethritis in male 06/15/2015  . CKD (chronic kidney disease) stage 3, GFR 30-59 ml/min    baseline Cr 1.7  . Closed C7 fracture ([place]) 11/06/2014  . DDD (degenerative disc disease) 03/2013   by CT, diffuse multilevel cervical and lumbar spondylosis  . Depression   . DISH (diffuse idiopathic skeletal hyperostosis) 03/2013   lumbar spine on xray  . Dizziness    multifactorial, s/p PT at [facility] 06/2014 with HEP  . Elevated PSA    previous-normalized (followed by Dr. Jeffie Pollock, rec no repeat unless urinary sxs)  . Essential tremor 09/27/2008   reviewed eval by Dr Jannifer Franklin in chart 2011   . GERD (gastroesophageal reflux disease)    h/o PUD  . Hearing loss 06/2012   eval - rec annual exam  . History of fracture of right hip   . History of phlebitis   . History of pyelonephritis 05/2014   hospitalization with sepsis  . HLD (hyperlipidemia)    hypertriglyceridemia  . Hx pulmonary embolism 08/1991    negative hypercoagulable panel 12/2014  . Macrocytic anemia 2014   stable B12/folate and periph smear 05/2013, again periph smear 2017 with mature cells, mild dacrocytosis  . Osteoporosis 11/2013   DEXA T score -2.7  . Prediabetes   . Schatzki's ring 02/2012   s/p dilation [name]  . Sleep apnea    no CPAP- sleep apnea "cleared up 15 years ago"  . Stroke ([place])   . Urge incontinence   . UTI (urinary tract infection) due to Enterococcus 05/13/2015    Past Surgical History:  Procedure Laterality Date  . APPENDECTOMY  1996  . CARDIAC CATHETERIZATION  2000   30% one vessel  . CARDIOVASCULAR STRESS TEST  03/2015   no ischemia, low risk, EF 76%  . CAROTID U/S  12/28/2007   nml  . CATARACT EXTRACTION  09/2009   bilateral  . COLONOSCOPY  1998   [place]  . COLONOSCOPY  07/2010   5 polyps, adenomatous, rec rpt 3 yrs  . COLONOSCOPY  04/2014   3 polyps, adenomatous, f/u open ended given age [name]  . CT ABD W & PELVIS WO CM  07/2001   Scarring of right lung, stable negative o/w  . CT ABD W & PELVIS WO CM  11/2000   ? stones, right LL scarring  . CYSTOSCOPY  1992

## 2016-08-05 ENCOUNTER — Ambulatory Visit
Admission: RE | Admit: 2016-08-05 | Discharge: 2016-08-05 | Disposition: A | Discharge status: Home / Self Care | Payer: Medicare Other | Source: Ambulatory Visit | Attending: Internal Medicine | Admitting: Internal Medicine

## 2016-08-05 ENCOUNTER — Ambulatory Visit (INDEPENDENT_AMBULATORY_CARE_PROVIDER_SITE_OTHER): Payer: Medicare Other | Admitting: Internal Medicine

## 2016-08-05 ENCOUNTER — Ambulatory Visit: Payer: Medicare Other | Admitting: Physical Therapy

## 2016-08-05 ENCOUNTER — Encounter: Payer: Self-pay | Admitting: Internal Medicine

## 2016-08-05 VITALS — BP 108/60 | HR 81 | Temp 98.5°F | Wt 175.0 lb

## 2016-08-05 DIAGNOSIS — I639 Cerebral infarction, unspecified: Secondary | ICD-10-CM | POA: Diagnosis not present

## 2016-08-05 DIAGNOSIS — I82442 Acute embolism and thrombosis of left tibial vein: Secondary | ICD-10-CM | POA: Diagnosis not present

## 2016-08-05 DIAGNOSIS — I82812 Embolism and thrombosis of superficial veins of left lower extremities: Secondary | ICD-10-CM | POA: Diagnosis not present

## 2016-08-05 DIAGNOSIS — M79662 Pain in left lower leg: Secondary | ICD-10-CM | POA: Diagnosis not present

## 2016-08-05 DIAGNOSIS — I82402 Acute embolism and thrombosis of unspecified deep veins of left lower extremity: Secondary | ICD-10-CM | POA: Diagnosis not present

## 2016-08-05 MED ORDER — ENOXAPARIN SODIUM 120 MG/0.8ML ~~LOC~~ SOLN: 120.0000 mg | SUBCUTANEOUS | 2 refills | Status: DC

## 2016-08-05 NOTE — Progress Notes (Signed)
Pre visit review using our clinic review tool, if applicable. No additional management support is needed unless otherwise documented below in the visit note. 

## 2016-08-05 NOTE — Assessment & Plan Note (Addendum)
Highly suspicious in patient with known hypercoaguable state Will check stat ultrasound If positive, start lovenox 120mg  daily injections and then follow up with Dr Darnell Level this week to start coumadin Delay car travel if has DVT  Called report Does have DVT Will send lovenox rx Follow up with Dr Mauricia Area to patient and wife

## 2016-08-05 NOTE — Addendum Note (Signed)
Addended by: Viviana Simpler I on: 08/05/2016 02:04 PM   Modules accepted: Orders

## 2016-08-05 NOTE — Progress Notes (Signed)
Subjective:    Patient ID: [name], male    DOB: 02-18-1931, 82 y.o.   MRN: [id number]  HPI Here with wife Concerned about another blood clot Pain in left calf when wife touched it Some swelling--but not more than usual (like mark when socks are removed)  On full dose aspirin Took coumadin for 20 years due to bad PE after DVT Known Factor VIII elevation and FH of thrombosis Lots of falls--so this was stopped  Going to [place] for 10 days Driving there  Current Outpatient Prescriptions on File Prior to Visit  Medication Sig Dispense Refill  . albuterol (PROVENTIL HFA;VENTOLIN HFA) 108 (90 Base) MCG/ACT inhaler Inhale 2 puffs into the lungs every 6 (six) hours as needed for wheezing or shortness of breath. 3 Inhaler 3  . aspirin EC 325 MG tablet Take 1 tablet (325 mg total) by mouth daily.    Marland Kitchen b complex vitamins tablet Take 1 tablet by mouth daily.    . Biotin 2500 MCG CAPS Take 1 capsule by mouth daily.    . Cholecalciferol (VITAMIN D) 2000 UNITS CAPS Take 1 capsule by mouth daily. Reported on 01/30/2016    . clotrimazole (LOTRIMIN) 1 % cream Apply 1 application topically 2 (two) times daily. (Patient taking differently: Apply 1 application topically 2 (two) times daily as needed. For rash) 30 g 0  . Coenzyme Q10 (COQ-10) 100 MG CAPS Take 1 capsule by mouth daily.     . Cyanocobalamin (B-12 PO) Take 10,000 mcg by mouth every Monday, Wednesday, and Friday.     Marland Kitchen dexlansoprazole (DEXILANT) 60 MG capsule Take 1 capsule (60 mg total) by mouth daily. 90 capsule 2  . docusate sodium (COLACE) 100 MG capsule Take 100 mg by mouth at bedtime.     . fenofibrate (TRICOR) 145 MG tablet Take 1 tablet (145 mg total) by mouth daily. 90 tablet 2  . fexofenadine (ALLEGRA) 180 MG tablet Take 180 mg by mouth daily.     Marland Kitchen gabapentin (NEURONTIN) 100 MG capsule Take 2 capsules (200 mg total) by mouth 3 (three) times daily. (Patient taking differently: Take 200-300 mg by mouth See admin  instructions. Take 300 mg every morning and 200 mg every night.) 540 capsule 1  . Multiple Vitamins-Minerals (MULTIVITAMIN PO) Take 1 tablet by mouth daily.     . simvastatin (ZOCOR) 40 MG tablet Take 1 tablet (40 mg total) by mouth daily. 90 tablet 2  . venlafaxine (EFFEXOR) 75 MG tablet Take 1 tablet (75 mg total) by mouth 2 (two) times daily with a meal. 180 tablet 2   No current facility-administered medications on file prior to visit.     Allergies  Allergen Reactions  . Lactose Intolerance (Gi)   . Nizatidine     REACTION: Rash (Axid)  . Sulfadiazine     REACTION: Hives  . Aricept [Donepezil Hcl] Rash  . Lipitor [Atorvastatin] Rash  . Penicillins Rash    Has patient had a PCN reaction causing immediate rash, facial/tongue/throat swelling, SOB or lightheadedness with hypotension: No Has patient had a PCN reaction causing severe rash involving mucus membranes or skin necrosis: No Has patient had a PCN reaction that required hospitalization No Has patient had a PCN reaction occurring within the last 10 years: No If all of the above answers are "NO",  then may proceed with Cephalosporin use.    Past Medical History:  Diagnosis Date  . Allergic rhinitis   . Arthritis   . Asthma  remote  . Blood transfusion 1990's  . CAD (coronary artery disease)    cath 2000 30% single vessel, normal nuclear stress test 12/03/2010, no evidence ischemia  . Candidal urethritis in male 06/15/2015  . CKD (chronic kidney disease) stage 3, GFR 30-59 ml/min    baseline Cr 1.7  . Closed C7 fracture ([place]) 11/06/2014  . DDD (degenerative disc disease) 03/2013   by CT, diffuse multilevel cervical and lumbar spondylosis  . Depression   . DISH (diffuse idiopathic skeletal hyperostosis) 03/2013   lumbar spine on xray  . Dizziness    multifactorial, s/p PT at [facility] 06/2014 with HEP  . Elevated PSA    previous-normalized (followed by Dr. Jeffie Pollock, rec no repeat unless urinary sxs)  . Essential tremor  09/27/2008   reviewed eval by Dr Jannifer Franklin in chart 2011   . GERD (gastroesophageal reflux disease)    h/o PUD  . Hearing loss 06/2012   eval - rec annual exam  . History of fracture of right hip   . History of phlebitis   . History of pyelonephritis 05/2014   hospitalization with sepsis  . HLD (hyperlipidemia)    hypertriglyceridemia  . Hx pulmonary embolism 08/1991   negative hypercoagulable panel 12/2014  . Macrocytic anemia 2014   stable B12/folate and periph smear 05/2013, again periph smear 2017 with mature cells, mild dacrocytosis  . Osteoporosis 11/2013   DEXA T score -2.7  . Prediabetes   . Schatzki's ring 02/2012   s/p dilation [name]  . Sleep apnea    no CPAP- sleep apnea "cleared up 15 years ago"  . Stroke ([place])   . Urge incontinence   . UTI (urinary tract infection) due to Enterococcus 05/13/2015    Past Surgical History:  Procedure Laterality Date  . APPENDECTOMY  1996  . CARDIAC CATHETERIZATION  2000   30% one vessel  . CARDIOVASCULAR STRESS TEST  03/2015   no ischemia, low risk, EF 76%  . CAROTID U/S  12/28/2007   nml  . CATARACT EXTRACTION  09/2009   bilateral  . COLONOSCOPY  1998   [place]  . COLONOSCOPY  07/2010   5 polyps, adenomatous, rec rpt 3 yrs  . COLONOSCOPY  04/2014   3 polyps, adenomatous, f/u open ended given age [name]  . CT ABD W & PELVIS WO CM  07/2001   Scarring of right lung, stable negative o/w  . CT ABD W & PELVIS WO CM  11/2000   ? stones, right LL scarring  . CYSTOSCOPY  1992   for kidney stones  . EEG  11/12/2009   nml  . ESOPHAGOGASTRODUODENOSCOPY  02/2012   dilation of schatzki's ring [name]  . ESOPHAGOGASTRODUODENOSCOPY N/A 11/15/2015   Procedure: ESOPHAGOGASTRODUODENOSCOPY (EGD);  Surgeon: Gatha Mayer, MD;  Location: [facility] ENDOSCOPY;  Service: Endoscopy;  Laterality: N/A;  . [place]   with incidental appendectomy  . HERNIA REPAIR  1979   Left  . HIP PINNING,CANNULATED Left 03/11/2016   Procedure:  CANNULATED HIP PINNING;  Surgeon: Rod Can, MD;  Location: [place];  Service: Orthopedics;  Laterality: Left;  . KNEE ARTHROSCOPY  03/24/02   Right (Dr. Mauri Pole)  . MRI  11/2009   Head, nml  . ORIF FEMORAL NECK FRACTURE W/ DHS Right 08/03/2002   Dr Mauri Pole  . TONSILLECTOMY  1965  . US ECHOCARDIOGRAPHY  12/28/2007   Mild aortic valve calcification EF 55%, basically nml  . V/Q SCAN  06/1999   negative  Family History  Problem Relation Age of Onset  . Stroke Mother   . Hypertension Mother   . Cancer Brother     prostate with mets  . Blindness Brother     legally  . Diabetes Brother   . Pulmonary embolism Sister     from shoulder operation  . Alcohol abuse Brother   . Colon cancer Neg Hx   . Esophageal cancer Neg Hx   . Rectal cancer Neg Hx   . Stomach cancer Neg Hx     Social History   Social History  . Marital status: Married    Spouse name: N/A  . Number of children: 2  . Years of education: N/A   Occupational History  . 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 Smoking status: Never Smoker  . Smokeless tobacco: Never Used  . Alcohol use No  . Drug use: No  . Sexual activity: Not on file   Other Topics Concern  . Not on file   Social History Narrative   Married with 2 children   Husband of Chester Coupland    Retired: was principal    Activity: walks dog 2-3 times daily about 61min, frequent stops    Diet: some water, good fruits/vegetables, fish 1x/wk, no sodas.      Advanced directives: Chauncey Reading is wife, [name]. Does not want prolonged life support if terminal      Patient does not drink caffeine.   Patient is right handed.    Review of Systems No chest pain No SOB Does have support hose--hasn't been wearing in general    Objective:   Physical Exam  Musculoskeletal:  No visible edema No redness, warmth Significant left calf tenderness          Assessment & Plan:

## 2016-08-06 ENCOUNTER — Telehealth: Payer: Self-pay | Admitting: Family Medicine

## 2016-08-06 MED ORDER — ASPIRIN EC 81 MG PO TBEC: 81.0000 mg | DELAYED_RELEASE_TABLET | Freq: Every day | ORAL | Status: DC

## 2016-08-06 MED ORDER — WARFARIN SODIUM 2.5 MG PO TABS: 2.5000 mg | ORAL_TABLET | Freq: Every day | ORAL | 1 refills | Status: DC

## 2016-08-06 NOTE — Telephone Encounter (Signed)
Recommend start coumadin 5mg  daily for 2 days then return to prior dose of 2.5mg  daily. plz schedule with coumadin clinic to re establish this or next week. INR goal 2-3 Decrease aspirin to 81mg  daily.

## 2016-08-06 NOTE — Telephone Encounter (Signed)
Message left for patient's wife to return my call.  

## 2016-08-06 NOTE — Telephone Encounter (Signed)
Spouse called to get appointment for patient for coumadin and to talk with PCP.

## 2016-08-07 ENCOUNTER — Encounter: Payer: Medicare Other | Admitting: Physical Therapy

## 2016-08-07 NOTE — Telephone Encounter (Signed)
Patient's wife notified and appt scheduled.

## 2016-08-08 ENCOUNTER — Ambulatory Visit (INDEPENDENT_AMBULATORY_CARE_PROVIDER_SITE_OTHER): Payer: Medicare Other

## 2016-08-08 ENCOUNTER — Ambulatory Visit (INDEPENDENT_AMBULATORY_CARE_PROVIDER_SITE_OTHER): Payer: Medicare Other | Admitting: Family Medicine

## 2016-08-08 ENCOUNTER — Encounter: Payer: Self-pay | Admitting: Family Medicine

## 2016-08-08 VITALS — BP 118/60 | HR 76 | Temp 97.7°F | Wt 173.0 lb

## 2016-08-08 DIAGNOSIS — R791 Abnormal coagulation profile: Secondary | ICD-10-CM | POA: Diagnosis not present

## 2016-08-08 DIAGNOSIS — I82442 Acute embolism and thrombosis of left tibial vein: Secondary | ICD-10-CM

## 2016-08-08 DIAGNOSIS — R269 Unspecified abnormalities of gait and mobility: Secondary | ICD-10-CM

## 2016-08-08 DIAGNOSIS — Z7901 Long term (current) use of anticoagulants: Secondary | ICD-10-CM | POA: Diagnosis not present

## 2016-08-08 DIAGNOSIS — I639 Cerebral infarction, unspecified: Secondary | ICD-10-CM | POA: Diagnosis not present

## 2016-08-08 DIAGNOSIS — S72002A Fracture of unspecified part of neck of left femur, initial encounter for closed fracture: Secondary | ICD-10-CM

## 2016-08-08 LAB — POCT INR: INR: 1

## 2016-08-08 NOTE — Patient Instructions (Addendum)
Take coumadin 5mg  daily over weekend, return Monday at 9:30am with coumadin clinic. Good to see you today. We will continue permanent anticoagulation.  Keep next appointment.

## 2016-08-08 NOTE — Assessment & Plan Note (Signed)
Some L thigh pain - they will schedule f/u with ortho.

## 2016-08-08 NOTE — Assessment & Plan Note (Addendum)
Venous US showing L tibial DVT and superficial thrombus of branch of great saphenous vein. We have started lovenox (120mg  daily) and coumadin. He has established with coumadin clinic. Leg is feeling better. RTC next month f/u visit with me, 1 wk coumadin clinic.  Will need lifelong anticoagulation.

## 2016-08-08 NOTE — Progress Notes (Signed)
BP 118/60   Pulse 76   Temp 97.7 F (36.5 C) (Oral)   Wt 173 lb (78.5 kg)   SpO2 97%   BMI 27.92 kg/m    CC: DVT Subjective:    Patient ID: [name], male    DOB: [birth year], 82 y.o.   MRN: [id number]  HPI: SUMMER HAENEL is a 82 y.o. male presenting on 08/08/2016 for Anticoagulation (started last night)   Recent dx LLE DVT by Dr Silvio Pate in setting of recently stopping coumadin due to fall risk although he is hypercoagulable (elevated factor 8 level). He was started on lovenox on Tuesday. Coumadin 5mg  started last night. Prior dose was 2.5mg  daily.   Venous US 08/05/2016 - DVT L posterior tibial veins, superficial venous thrombosis of branch of L great saphenous vein.   Off aspirin.   Relevant past medical, surgical, family and social history reviewed and updated as indicated. Interim medical history since our last visit reviewed. Allergies and medications reviewed and updated. Current Outpatient Prescriptions on File Prior to Visit  Medication Sig  . albuterol (PROVENTIL HFA;VENTOLIN HFA) 108 (90 Base) MCG/ACT inhaler Inhale 2 puffs into the lungs every 6 (six) hours as needed for wheezing or shortness of breath.  Marland Kitchen aspirin EC 81 MG tablet Take 1 tablet (81 mg total) by mouth daily.  Marland Kitchen b complex vitamins tablet Take 1 tablet by mouth daily.  . Biotin 2500 MCG CAPS Take 1 capsule by mouth daily.  . Cholecalciferol (VITAMIN D) 2000 UNITS CAPS Take 1 capsule by mouth daily. Reported on 01/30/2016  . clotrimazole (LOTRIMIN) 1 % cream Apply 1 application topically 2 (two) times daily. (Patient taking differently: Apply 1 application topically 2 (two) times daily as needed. For rash)  . Coenzyme Q10 (COQ-10) 100 MG CAPS Take 1 capsule by mouth daily.   . Cyanocobalamin (B-12 PO) Take 10,000 mcg by mouth every Monday, Wednesday, and Friday.   Marland Kitchen dexlansoprazole (DEXILANT) 60 MG capsule Take 1 capsule (60 mg total) by mouth daily.  Marland Kitchen docusate sodium (COLACE) 100 MG capsule Take  100 mg by mouth at bedtime.   . enoxaparin (LOVENOX) 120 MG/0.8ML injection Inject 0.8 mLs (120 mg total) into the skin daily.  . fenofibrate (TRICOR) 145 MG tablet Take 1 tablet (145 mg total) by mouth daily.  . fexofenadine (ALLEGRA) 180 MG tablet Take 180 mg by mouth daily.   Marland Kitchen gabapentin (NEURONTIN) 100 MG capsule Take 2 capsules (200 mg total) by mouth 3 (three) times daily. (Patient taking differently: Take 200-300 mg by mouth See admin instructions. Take 300 mg every morning and 200 mg every night.)  . Multiple Vitamins-Minerals (MULTIVITAMIN PO) Take 1 tablet by mouth daily.   . simvastatin (ZOCOR) 40 MG tablet Take 1 tablet (40 mg total) by mouth daily.  Marland Kitchen venlafaxine (EFFEXOR) 75 MG tablet Take 1 tablet (75 mg total) by mouth 2 (two) times daily with a meal.  . warfarin (COUMADIN) 2.5 MG tablet Take 1 tablet (2.5 mg total) by mouth daily. As directed   No current facility-administered medications on file prior to visit.     Review of Systems Per HPI unless specifically indicated in ROS section     Objective:    BP 118/60   Pulse 76   Temp 97.7 F (36.5 C) (Oral)   Wt 173 lb (78.5 kg)   SpO2 97%   BMI 27.92 kg/m   Wt Readings from Last 3 Encounters:  08/08/16 173 lb (78.5 kg)  08/05/16  175 lb (79.4 kg)  07/11/16 163 lb 6.4 oz (74.1 kg)    Physical Exam  Constitutional: He appears well-developed and well-nourished. No distress.  Walks with rollator  HENT:  Mouth/Throat: Oropharynx is clear and moist. No oropharyngeal exudate.  Musculoskeletal: He exhibits no edema.  Discomfort to palpation at L medial calf Discomfort to palpation L lateral thigh  Skin: Skin is warm and dry. No rash noted.  Psychiatric: He has a normal mood and affect.  Nursing note and vitals reviewed.  Results for orders placed or performed in visit on 05/16/16  POCT INR  Result Value Ref Range   INR 1.8       Assessment & Plan:   Problem List Items Addressed This Visit    Acute deep vein  thrombosis (DVT) of left tibial vein (HCC) - Primary    Venous US showing L tibial DVT and superficial thrombus of branch of great saphenous vein. We have started lovenox (120mg  daily) and coumadin. He has established with coumadin clinic. Leg is feeling better. RTC next month f/u visit with me, 1 wk coumadin clinic.  Will need lifelong anticoagulation.      Elevated factor VIII level    Will need to continue lifelong anticoagulant given recurrent DVTs off anticoagulant.       Encounter for current long-term use of anticoagulants    Continue 5mg  coumadin daily through weekend then recheck Monday with coumadin clinic. Continue lovenox at this time.       Left displaced femoral neck fracture (HCC)    Some L thigh pain - they will schedule f/u with ortho.       UNSTEADY GAIT    Has seen neurology. Continue regular walker use.           Follow up plan: No Follow-up on file.  Ria Bush, MD

## 2016-08-08 NOTE — Assessment & Plan Note (Signed)
Continue 5mg  coumadin daily through weekend then recheck Monday with coumadin clinic. Continue lovenox at this time.

## 2016-08-08 NOTE — Assessment & Plan Note (Addendum)
Has seen neurology. Continue regular walker use.

## 2016-08-08 NOTE — Patient Instructions (Signed)
Pre visit review using our clinic review tool, if applicable. No additional management support is needed unless otherwise documented below in the visit note. 

## 2016-08-08 NOTE — Assessment & Plan Note (Signed)
Will need to continue lifelong anticoagulant given recurrent DVTs off anticoagulant.

## 2016-08-11 ENCOUNTER — Ambulatory Visit (INDEPENDENT_AMBULATORY_CARE_PROVIDER_SITE_OTHER): Payer: Medicare Other

## 2016-08-11 DIAGNOSIS — I639 Cerebral infarction, unspecified: Secondary | ICD-10-CM | POA: Diagnosis not present

## 2016-08-11 LAB — POCT INR: INR: 1.9

## 2016-08-11 NOTE — Patient Instructions (Signed)
Pre visit review using our clinic review tool, if applicable. No additional management support is needed unless otherwise documented below in the visit note. 

## 2016-08-12 ENCOUNTER — Encounter: Payer: Medicare Other | Admitting: Physical Therapy

## 2016-08-14 DIAGNOSIS — M1712 Unilateral primary osteoarthritis, left knee: Secondary | ICD-10-CM | POA: Diagnosis not present

## 2016-08-14 DIAGNOSIS — S72092D Other fracture of head and neck of left femur, subsequent encounter for closed fracture with routine healing: Secondary | ICD-10-CM | POA: Diagnosis not present

## 2016-08-15 ENCOUNTER — Ambulatory Visit (INDEPENDENT_AMBULATORY_CARE_PROVIDER_SITE_OTHER): Payer: Medicare Other

## 2016-08-15 ENCOUNTER — Telehealth: Payer: Self-pay

## 2016-08-15 DIAGNOSIS — Z5181 Encounter for therapeutic drug level monitoring: Secondary | ICD-10-CM

## 2016-08-15 DIAGNOSIS — Z8673 Personal history of transient ischemic attack (TIA), and cerebral infarction without residual deficits: Secondary | ICD-10-CM | POA: Diagnosis not present

## 2016-08-15 DIAGNOSIS — I639 Cerebral infarction, unspecified: Secondary | ICD-10-CM

## 2016-08-15 LAB — POCT INR: INR: 1.9

## 2016-08-15 NOTE — Telephone Encounter (Signed)
Patient's wife requests I send in new R/X for Coumadin 2.5mg  to their mail order pharmacy.  Upon investigation, #90, 1 rf has already been sent in to [facility] on 08/06/16.  Made patient's wife aware, she will contact them to be sure it is ready and call back if anything further is needed. If [name] has the med, she does not wish to send a new order to mail order and will pick up from there.

## 2016-08-15 NOTE — Patient Instructions (Signed)
Pre visit review using our clinic review tool, if applicable. No additional management support is needed unless otherwise documented below in the visit note. 

## 2016-08-18 ENCOUNTER — Ambulatory Visit: Payer: Medicare Other | Admitting: Physical Therapy

## 2016-08-18 DIAGNOSIS — H3589 Other specified retinal disorders: Secondary | ICD-10-CM | POA: Diagnosis not present

## 2016-08-18 DIAGNOSIS — H43813 Vitreous degeneration, bilateral: Secondary | ICD-10-CM | POA: Diagnosis not present

## 2016-08-18 NOTE — Telephone Encounter (Signed)
Patient returns call to me late Friday afternoon, 08/15/16, to report that [name] does have R/X waiting for them and she will pick up today.

## 2016-08-22 ENCOUNTER — Ambulatory Visit (INDEPENDENT_AMBULATORY_CARE_PROVIDER_SITE_OTHER): Payer: Medicare Other

## 2016-08-22 DIAGNOSIS — Z5181 Encounter for therapeutic drug level monitoring: Secondary | ICD-10-CM

## 2016-08-22 DIAGNOSIS — Z8673 Personal history of transient ischemic attack (TIA), and cerebral infarction without residual deficits: Secondary | ICD-10-CM

## 2016-08-22 DIAGNOSIS — I639 Cerebral infarction, unspecified: Secondary | ICD-10-CM

## 2016-08-22 LAB — POCT INR: INR: 2.2

## 2016-08-22 NOTE — Patient Instructions (Signed)
Pre visit review using our clinic review tool, if applicable. No additional management support is needed unless otherwise documented below in the visit note. 

## 2016-08-29 ENCOUNTER — Ambulatory Visit (INDEPENDENT_AMBULATORY_CARE_PROVIDER_SITE_OTHER): Payer: Medicare Other

## 2016-08-29 ENCOUNTER — Ambulatory Visit: Payer: Medicare Other

## 2016-08-29 DIAGNOSIS — I639 Cerebral infarction, unspecified: Secondary | ICD-10-CM | POA: Diagnosis not present

## 2016-08-29 DIAGNOSIS — Z5181 Encounter for therapeutic drug level monitoring: Secondary | ICD-10-CM

## 2016-08-29 LAB — POCT INR: INR: 1.8

## 2016-08-29 NOTE — Patient Instructions (Signed)
Pre visit review using our clinic review tool, if applicable. No additional management support is needed unless otherwise documented below in the visit note. 

## 2016-09-11 ENCOUNTER — Ambulatory Visit (INDEPENDENT_AMBULATORY_CARE_PROVIDER_SITE_OTHER): Payer: Medicare Other | Admitting: Family Medicine

## 2016-09-11 ENCOUNTER — Other Ambulatory Visit
Admission: RE | Admit: 2016-09-11 | Discharge: 2016-09-11 | Disposition: A | Discharge status: Home / Self Care | Payer: Medicare Other | Source: Ambulatory Visit | Attending: Family Medicine | Admitting: Family Medicine

## 2016-09-11 ENCOUNTER — Encounter: Payer: Self-pay | Admitting: Family Medicine

## 2016-09-11 ENCOUNTER — Telehealth: Payer: Self-pay

## 2016-09-11 ENCOUNTER — Ambulatory Visit (INDEPENDENT_AMBULATORY_CARE_PROVIDER_SITE_OTHER): Payer: Medicare Other

## 2016-09-11 ENCOUNTER — Ambulatory Visit
Admission: RE | Admit: 2016-09-11 | Discharge: 2016-09-11 | Disposition: A | Discharge status: Home / Self Care | Payer: Medicare Other | Source: Ambulatory Visit | Attending: Family Medicine | Admitting: Family Medicine

## 2016-09-11 VITALS — BP 126/74 | HR 64 | Temp 97.8°F | Wt 178.0 lb

## 2016-09-11 DIAGNOSIS — Z5181 Encounter for therapeutic drug level monitoring: Secondary | ICD-10-CM

## 2016-09-11 DIAGNOSIS — I639 Cerebral infarction, unspecified: Secondary | ICD-10-CM

## 2016-09-11 DIAGNOSIS — R1031 Right lower quadrant pain: Secondary | ICD-10-CM | POA: Insufficient documentation

## 2016-09-11 DIAGNOSIS — K4090 Unilateral inguinal hernia, without obstruction or gangrene, not specified as recurrent: Secondary | ICD-10-CM | POA: Diagnosis not present

## 2016-09-11 DIAGNOSIS — R1030 Lower abdominal pain, unspecified: Secondary | ICD-10-CM | POA: Diagnosis not present

## 2016-09-11 DIAGNOSIS — R269 Unspecified abnormalities of gait and mobility: Secondary | ICD-10-CM | POA: Diagnosis not present

## 2016-09-11 DIAGNOSIS — I82442 Acute embolism and thrombosis of left tibial vein: Secondary | ICD-10-CM | POA: Diagnosis not present

## 2016-09-11 LAB — CBC WITH DIFFERENTIAL/PLATELET
BASOS PCT: 0.5 % (ref 0.0–3.0)
Basophils Absolute: 0 10*3/uL (ref 0.0–0.1)
EOS PCT: 2.3 % (ref 0.0–5.0)
Eosinophils Absolute: 0.1 10*3/uL (ref 0.0–0.7)
HCT: 34 % — ABNORMAL LOW (ref 39.0–52.0)
Hemoglobin: 11.7 g/dL — ABNORMAL LOW (ref 13.0–17.0)
LYMPHS ABS: 1.3 10*3/uL (ref 0.7–4.0)
Lymphocytes Relative: 21.8 % (ref 12.0–46.0)
MCHC: 34.4 g/dL (ref 30.0–36.0)
MCV: 112 fl — ABNORMAL HIGH (ref 78.0–100.0)
MONO ABS: 0.4 10*3/uL (ref 0.1–1.0)
Monocytes Relative: 6.5 % (ref 3.0–12.0)
NEUTROS ABS: 4 10*3/uL (ref 1.4–7.7)
NEUTROS PCT: 68.9 % (ref 43.0–77.0)
PLATELETS: 150 10*3/uL (ref 150.0–400.0)
RBC: 3.04 Mil/uL — ABNORMAL LOW (ref 4.22–5.81)
RDW: 15.3 % (ref 11.5–15.5)
WBC: 5.8 10*3/uL (ref 4.0–10.5)

## 2016-09-11 LAB — COMPREHENSIVE METABOLIC PANEL
ALK PHOS: 42 U/L (ref 39–117)
ALT: 38 U/L (ref 0–53)
AST: 43 U/L — ABNORMAL HIGH (ref 0–37)
Albumin: 3.9 g/dL (ref 3.5–5.2)
BUN: 31 mg/dL — AB (ref 6–23)
CO2: 31 mEq/L (ref 19–32)
Calcium: 9.8 mg/dL (ref 8.4–10.5)
Chloride: 107 mEq/L (ref 96–112)
Creatinine, Ser: 1.65 mg/dL — ABNORMAL HIGH (ref 0.40–1.50)
GFR: 42.54 mL/min — ABNORMAL LOW (ref >60.00)
GLUCOSE: 166 mg/dL — AB (ref 70–99)
POTASSIUM: 4.6 meq/L (ref 3.5–5.1)
SODIUM: 143 meq/L (ref 135–145)
Total Bilirubin: 0.9 mg/dL (ref 0.2–1.2)
Total Protein: 6.6 g/dL (ref 6.0–8.3)

## 2016-09-11 LAB — POCT I-STAT CREATININE: CREATININE: 1.6 mg/dL — AB (ref 0.61–1.24)

## 2016-09-11 LAB — POCT INR: INR: 1.8

## 2016-09-11 MED ORDER — IOPAMIDOL (ISOVUE-300) INJECTION 61%
80.0000 mL | Freq: Once | INTRAVENOUS | Status: AC | PRN
  Administered 2016-09-11: 75 mL via INTRAVENOUS

## 2016-09-11 NOTE — Patient Instructions (Addendum)
Coumadin check today. Return after 4/11 for next medicare wellness visit. Letter provided today.  We will check CT scan for knot in right lower abdomen - see Rosaria Ferries or Shirlean Mylar on your way out.

## 2016-09-11 NOTE — Progress Notes (Signed)
Pre visit review using our clinic review tool, if applicable. No additional management support is needed unless otherwise documented below in the visit note. 

## 2016-09-11 NOTE — Assessment & Plan Note (Addendum)
New over last few days with R lower abdomen knot as well as nausea, concern for inguinal hernia - will check CBC, CMP as well as CT pelvis with contrast for further evaluation r/p incarcerated hernia. If uncomplicated hernia, will likely just recommend continued monitoring given comorbidities. Pt agrees with plan.
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
## 2016-09-11 NOTE — Assessment & Plan Note (Addendum)
Ambulates with assistance of wife and rollator.  Letter written for [facility] per patient request.

## 2016-09-11 NOTE — Patient Instructions (Signed)
Pre visit review using our clinic review tool, if applicable. No additional management support is needed unless otherwise documented below in the visit note. 

## 2016-09-11 NOTE — Telephone Encounter (Signed)
See result note.  

## 2016-09-11 NOTE — Telephone Encounter (Signed)
Lindsey with [facility] CT called report for CT of abdomen. Report taken to Dr Darnell Level and is in epic. Pt is not waiting.

## 2016-09-15 ENCOUNTER — Encounter: Payer: Self-pay | Admitting: Neurology

## 2016-09-15 ENCOUNTER — Ambulatory Visit (INDEPENDENT_AMBULATORY_CARE_PROVIDER_SITE_OTHER): Payer: Medicare Other | Admitting: Neurology

## 2016-09-15 VITALS — BP 129/65 | HR 79 | Ht 66.0 in | Wt 177.5 lb

## 2016-09-15 DIAGNOSIS — I639 Cerebral infarction, unspecified: Secondary | ICD-10-CM

## 2016-09-15 DIAGNOSIS — G250 Essential tremor: Secondary | ICD-10-CM

## 2016-09-15 DIAGNOSIS — R269 Unspecified abnormalities of gait and mobility: Secondary | ICD-10-CM | POA: Diagnosis not present

## 2016-09-15 NOTE — Progress Notes (Signed)
Reason for visit: Essential tremor  [name] is an 83 y.o. male  History of present illness:  Mr. Ugolini is an 83 year old right-handed white male with a history of an essential tremor and a gait disorder. 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 The patient has continued to have episodic falls, even with a walker. The patient has undergone physical therapy for his balance. MRI of the brain and a recent CT scan of the brain showing moderate levels of small vessel ischemic changes. The patient has fractured her left hip previously, he continues to have left hip pain and left knee pain with weightbearing, this goes away when he sits down. The patient has fallen and fractured ribs on the right side on 07/07/2016. The patient has been placed back on Coumadin for left lower extremity deep venous thrombosis. He returns to this office for an evaluation.  Past Medical History:  Diagnosis Date  . Allergic rhinitis   . Arthritis   . Asthma    remote  . Blood transfusion 1990's  . CAD (coronary artery disease)    cath 2000 30% single vessel, normal nuclear stress test 12/03/2010, no evidence ischemia  . Candidal urethritis in male 06/15/2015  . CKD (chronic kidney disease) stage 3, GFR 30-59 ml/min    baseline Cr 1.7  . Closed C7 fracture ([place]) 11/06/2014  . DDD (degenerative disc disease) 03/2013   by CT, diffuse multilevel cervical and lumbar spondylosis  . Depression   . DISH (diffuse idiopathic skeletal hyperostosis) 03/2013   lumbar spine on xray  . Dizziness    multifactorial, s/p PT at [facility] 06/2014 with HEP  . DVT (deep venous thrombosis) ([place]) 07/2016  . Elevated PSA    previous-normalized (followed by Dr. Jeffie Pollock, rec no repeat unless urinary sxs)  . Essential tremor 09/27/2008   reviewed eval by Dr Jannifer Franklin in chart 2011   . GERD (gastroesophageal reflux disease)    h/o PUD  . Hearing loss 06/2012   eval - rec annual exam  . History of fracture  of right hip   . History of phlebitis   . History of pyelonephritis 05/2014   hospitalization with sepsis  . HLD (hyperlipidemia)    hypertriglyceridemia  . Hx pulmonary embolism 08/1991   negative hypercoagulable panel 12/2014  . Inguinal hernia   . Macrocytic anemia 2014   stable B12/folate and periph smear 05/2013, again periph smear 2017 with mature cells, mild dacrocytosis  . Osteoporosis 11/2013   DEXA T score -2.7  . Prediabetes   . Schatzki's ring 02/2012   s/p dilation [name]  . Sleep apnea    no CPAP- sleep apnea "cleared up 15 years ago"  . Stroke ([place])   . Urge incontinence   . UTI (urinary tract infection) due to Enterococcus 05/13/2015    Past Surgical History:  Procedure Laterality Date  . APPENDECTOMY  1996  . CARDIAC CATHETERIZATION  2000   30% one vessel  . CARDIOVASCULAR STRESS TEST  03/2015   no ischemia, low risk, EF 76%  . CAROTID U/S  12/28/2007   nml  . CATARACT EXTRACTION  09/2009   bilateral  . COLONOSCOPY  1998   [place]  . COLONOSCOPY  07/2010   5 polyps, adenomatous, rec rpt 3 yrs  . COLONOSCOPY  04/2014   3 polyps, adenomatous, f/u open ended given age [name]  . CT ABD W & PELVIS WO CM  07/2001   Scarring of right  lung, stable negative o/w  . CT ABD W & PELVIS WO CM  11/2000   ? stones, right LL scarring  . CYSTOSCOPY  1992   for kidney stones  . EEG  11/12/2009   nml  . ESOPHAGOGASTRODUODENOSCOPY  02/2012   dilation of schatzki's ring [name]  . ESOPHAGOGASTRODUODENOSCOPY N/A 11/15/2015   Procedure: ESOPHAGOGASTRODUODENOSCOPY (EGD);  Surgeon: Gatha Mayer, MD;  Location: [facility] ENDOSCOPY;  Service: Endoscopy;  Laterality: N/A;  . [place]   with incidental appendectomy  . HERNIA REPAIR  1979   Left  . HIP PINNING,CANNULATED Left 03/11/2016   Procedure: CANNULATED HIP PINNING;  Surgeon: Rod Can, MD;  Location: [facility];  Service: Orthopedics;  Laterality: Left;  . KNEE ARTHROSCOPY  03/24/02   Right (Dr. Mauri Pole)  .  MRI  11/2009   Head, nml  . ORIF FEMORAL NECK FRACTURE W/ DHS Right 08/03/2002   Dr Mauri Pole  . TONSILLECTOMY  1965  . US ECHOCARDIOGRAPHY  12/28/2007   Mild aortic valve calcification EF 55%, basically nml  . V/Q SCAN  06/1999   negative    Family History  Problem Relation Age of Onset  . Stroke Mother   . Hypertension Mother   . Cancer Brother     prostate with mets  . Blindness Brother     legally  . Diabetes Brother   . Pulmonary embolism Sister     from shoulder operation  . Alcohol abuse Brother   . Colon cancer Neg Hx   . Esophageal cancer Neg Hx   . Rectal cancer Neg Hx   . Stomach cancer Neg Hx     Social history:  reports that he has never smoked. He has never used smokeless tobacco. He reports that he does not drink alcohol or use drugs.    Allergies  Allergen Reactions  . Lactose Intolerance (Gi)   . Nizatidine     REACTION: Rash (Axid)  . Sulfadiazine     REACTION: Hives  . Aricept [Donepezil Hcl] Rash  . Lipitor [Atorvastatin] Rash  . Penicillins Rash    Has patient had a PCN reaction causing immediate rash, facial/tongue/throat swelling, SOB or lightheadedness with hypotension: No Has patient had a PCN reaction causing severe rash involving mucus membranes or skin necrosis: No Has patient had a PCN reaction that required hospitalization No Has patient had a PCN reaction occurring within the last 10 years: No If all of the above answers are "NO",  then may proceed with Cephalosporin use.    Medications:  Prior to Admission medications   Medication Sig Start Date End Date Taking? Authorizing Provider  albuterol (PROVENTIL HFA;VENTOLIN HFA) 108 (90 Base) MCG/ACT inhaler Inhale 2 puffs into the lungs every 6 (six) hours as needed for wheezing or shortness of breath. 01/10/16  Yes Ria Bush, MD  b complex vitamins tablet Take 1 tablet by mouth daily.   Yes Historical Provider, MD  Biotin 2500 MCG CAPS Take 1 capsule by mouth daily.   Yes Historical  Provider, MD  Cholecalciferol (VITAMIN D) 2000 UNITS CAPS Take 1 capsule by mouth daily. Reported on 01/30/2016   Yes Historical Provider, MD  clotrimazole (LOTRIMIN) 1 % cream Apply 1 application topically 2 (two) times daily. Patient taking differently: Apply 1 application topically 2 (two) times daily as needed. For rash 05/02/15  Yes Pleas Koch, NP  Coenzyme Q10 (COQ-10) 100 MG CAPS Take 1 capsule by mouth daily.    Yes Historical Provider, MD  Cyanocobalamin (B-12 PO) Take 10,000 mcg by mouth every Monday, Wednesday, and Friday.    Yes Historical Provider, MD  dexlansoprazole (DEXILANT) 60 MG capsule Take 1 capsule (60 mg total) by mouth daily. 01/10/16  Yes Ria Bush, MD  docusate sodium (COLACE) 100 MG capsule Take 100 mg by mouth at bedtime.    Yes Historical Provider, MD  fenofibrate (TRICOR) 145 MG tablet Take 1 tablet (145 mg total) by mouth daily. 01/10/16  Yes Ria Bush, MD  fexofenadine (ALLEGRA) 180 MG tablet Take 180 mg by mouth daily.    Yes Historical Provider, MD  gabapentin (NEURONTIN) 100 MG capsule Take 2 capsules (200 mg total) by mouth 3 (three) times daily. Patient taking differently: Take 200-300 mg by mouth See admin instructions. Take 300 mg every morning and 200 mg every night. 09/21/15  Yes Kathrynn Ducking, MD  Multiple Vitamins-Minerals (MULTIVITAMIN PO) Take 1 tablet by mouth daily.    Yes Historical Provider, MD  simvastatin (ZOCOR) 40 MG tablet Take 1 tablet (40 mg total) by mouth daily. 01/10/16  Yes Ria Bush, MD  venlafaxine (EFFEXOR) 75 MG tablet Take 1 tablet (75 mg total) by mouth 2 (two) times daily with a meal. 01/10/16  Yes Ria Bush, MD  warfarin (COUMADIN) 2.5 MG tablet Take 1 tablet (2.5 mg total) by mouth daily. As directed 08/06/16  Yes Ria Bush, MD    ROS:  Out of a complete 14 system review of symptoms, the patient complains only of the following symptoms, and all other reviewed systems are negative.  Hearing  loss, difficulty swallowing Leg swelling Cold intolerance Nausea Daytime sleepiness, snoring Environmental allergies Incontinence of the bladder, frequency of urination, urinary urgency Joint pain, muscle cramps, walking difficulties, neck stiffness Bruising easily, anemia Memory loss, tremors Confusion, decreased concentration, depression  Blood pressure 129/65, pulse 79, height 5\' 6"  (1.676 m), weight 177 lb 8 oz (80.5 kg).  Physical Exam  General: The patient is alert and cooperative at the time of the examination.  Skin: 1+ edema at the ankles is noted bilaterally.   Neurologic Exam  Mental status: The patient is alert and oriented x 3 at the time of the examination. The patient has apparent normal recent and remote memory, with an apparently normal attention span and concentration ability. Mini-Mental Status Examination done today shows a total score of 27/30.   Cranial nerves: Facial symmetry is present. Speech is normal, no aphasia or dysarthria is noted. Extraocular movements are full. Visual fields are full.  Motor: The patient has good strength in all 4 extremities.  Sensory examination: Soft touch sensation is symmetric on the face, arms, and legs.  Coordination: The patient has good finger-nose-finger and heel-to-shin bilaterally.  Gait and station: The patient has a slightly wide-based, unsteady gait. The patient is able to walk with a walker. Tandem gait was not attempted. Romberg is negative. No drift is seen.  Reflexes: Deep tendon reflexes are symmetric.   Assessment/Plan:  1. Benign essential tremor  2. Gait disturbance  3. Memory disturbance  The patient likely has a multifactorial gait disorder, and he does have small vessel disease to moderate degree that is periventricular, with some in the brainstem. The patient also has an essential tremor which can be associated with a gait disorder. He has ongoing left hip and left knee discomfort with walking,  better with sitting. He is also on gabapentin with a history of chronic renal insufficiency. The gabapentin dosing will be decreased to 100 mg 3 times  daily for least 3 weeks, if he does not have a significant worsening of tremor, he will go off the medication at that time. He will follow-up in 4 or 5 months. The memory issues will be followed over time.    Jill Alexanders MD 09/15/2016 1:04 PM  [facility] [address] [place], [place] [address]  Phone [phone] Fax [phone]

## 2016-09-26 ENCOUNTER — Ambulatory Visit (INDEPENDENT_AMBULATORY_CARE_PROVIDER_SITE_OTHER): Payer: Medicare Other

## 2016-09-26 ENCOUNTER — Ambulatory Visit: Payer: Medicare Other

## 2016-09-26 DIAGNOSIS — Z5181 Encounter for therapeutic drug level monitoring: Secondary | ICD-10-CM

## 2016-09-26 DIAGNOSIS — I639 Cerebral infarction, unspecified: Secondary | ICD-10-CM

## 2016-09-26 LAB — POCT INR: INR: 2.4

## 2016-09-26 NOTE — Patient Instructions (Signed)
Pre visit review using our clinic review tool, if applicable. No additional management support is needed unless otherwise documented below in the visit note. 

## 2016-10-02 ENCOUNTER — Telehealth: Payer: Self-pay | Admitting: Gastroenterology

## 2016-10-02 MED ORDER — DEXLANSOPRAZOLE 60 MG PO CPDR: 1.0000 | DELAYED_RELEASE_CAPSULE | Freq: Every day | ORAL | 0 refills | Status: DC

## 2016-10-02 NOTE — Telephone Encounter (Signed)
patient wife states that patient is out of dexilant and mail order rx will not arrive for another week. She is wanting to know if we could send in an emergency supply(1 week) to [facility]: [phone]. patient wife states that patient is starting to feel nauseous.

## 2016-10-15 DIAGNOSIS — J40 Bronchitis, not specified as acute or chronic: Secondary | ICD-10-CM | POA: Diagnosis not present

## 2016-10-15 DIAGNOSIS — R05 Cough: Secondary | ICD-10-CM | POA: Diagnosis not present

## 2016-10-15 DIAGNOSIS — N3001 Acute cystitis with hematuria: Secondary | ICD-10-CM | POA: Diagnosis not present

## 2016-10-17 DIAGNOSIS — H169 Unspecified keratitis: Secondary | ICD-10-CM | POA: Diagnosis not present

## 2016-10-28 ENCOUNTER — Ambulatory Visit (INDEPENDENT_AMBULATORY_CARE_PROVIDER_SITE_OTHER): Payer: Medicare Other

## 2016-10-28 DIAGNOSIS — I639 Cerebral infarction, unspecified: Secondary | ICD-10-CM | POA: Diagnosis not present

## 2016-10-28 DIAGNOSIS — Z5181 Encounter for therapeutic drug level monitoring: Secondary | ICD-10-CM

## 2016-10-28 LAB — POCT INR: INR: 2.6

## 2016-10-28 NOTE — Patient Instructions (Signed)
Pre visit review using our clinic review tool, if applicable. No additional management support is needed unless otherwise documented below in the visit note. 

## 2016-10-29 ENCOUNTER — Encounter: Payer: Self-pay | Admitting: Family Medicine

## 2016-10-29 ENCOUNTER — Ambulatory Visit (INDEPENDENT_AMBULATORY_CARE_PROVIDER_SITE_OTHER): Payer: Medicare Other | Admitting: Family Medicine

## 2016-10-29 VITALS — BP 128/56 | HR 76 | Temp 98.8°F | Resp 18 | Wt 166.5 lb

## 2016-10-29 DIAGNOSIS — R1310 Dysphagia, unspecified: Secondary | ICD-10-CM | POA: Diagnosis not present

## 2016-10-29 DIAGNOSIS — I639 Cerebral infarction, unspecified: Secondary | ICD-10-CM

## 2016-10-29 DIAGNOSIS — R1319 Other dysphagia: Secondary | ICD-10-CM

## 2016-10-29 MED ORDER — SUCRALFATE 1 GM/10ML PO SUSP: 1.0000 g | Freq: Three times a day (TID) | ORAL | 0 refills | Status: DC

## 2016-10-29 NOTE — Progress Notes (Signed)
Pre visit review using our clinic review tool, if applicable. No additional management support is needed unless otherwise documented below in the visit note. 

## 2016-10-29 NOTE — Assessment & Plan Note (Addendum)
Sore throat with acute worsening of dysphagia x 2 days. No signs of streptococcal infection. ?recurrent stricture. appt with GI scheduled early next month. Will start with barium swallow. Will also contiue dexilant 60mg  daily and add carafate solution with meals. Pt/wife agree with plan.  With epigastric pain - ?esoph spasm, however pt has not been able to tolerate nitro in the past.

## 2016-10-29 NOTE — Patient Instructions (Addendum)
See Rosaria Ferries to schedule barium swallow study Continue dexilant. Start carafate solution with meals.  We will be in touch with results.

## 2016-10-29 NOTE — Progress Notes (Addendum)
BP (!) 128/56 (BP Location: Right Arm, Patient Position: Sitting, Cuff Size: Normal)   Pulse 76   Temp 98.8 F (37.1 C) (Oral)   Resp 18   Wt 166 lb 8 oz (75.5 kg)   SpO2 97%   BMI 26.87 kg/m    CC: ST with trouble swallowing Subjective:    Patient ID: [name], male    DOB: December 02, 1930, 83 y.o.   MRN: [id number]  HPI: [name] is a 83 y.o. male presenting on 10/29/2016 for Sore Throat (vomiting yesterday morning)   ST developed 2 nights ago. Yesterday morning while eating scrambed eggs had significant dysphagia where he coughed up eggs, unable to swallow further food or liquid the rest of the day, had trouble swallowing tablets. Hasn't wanted to eat anything since yesterday. Some epigastric discomfort with swallowing described as sharp pains. Tried pepto bismol.   Denies fevers/chills, diarrhea. Passing gas well.   Has scheduled appt with GI 11/2016.  He did have EGD 11/2015 - but dilation was not necessary - this was at same hospitalization as CVA.  Regular with dexilant 60mg  daily.  Gabapentin was recently stopped - ineffective.   Bad reaction to nitroglycerin years ago - severe hypotension.   Relevant past medical, surgical, family and social history reviewed and updated as indicated. Interim medical history since our last visit reviewed. Allergies and medications reviewed and updated.   Outpatient Medications Prior to Visit  Medication Sig Dispense Refill  . albuterol (PROVENTIL HFA;VENTOLIN HFA) 108 (90 Base) MCG/ACT inhaler Inhale 2 puffs into the lungs every 6 (six) hours as needed for wheezing or shortness of breath. 3 Inhaler 3  . b complex vitamins tablet Take 1 tablet by mouth daily.    . Biotin 2500 MCG CAPS Take 1 capsule by mouth daily.    . Cholecalciferol (VITAMIN D) 2000 UNITS CAPS Take 1 capsule by mouth daily. Reported on 01/30/2016    . clotrimazole (LOTRIMIN) 1 % cream Apply 1 application topically 2 (two) times daily. (Patient taking  differently: Apply 1 application topically 2 (two) times daily as needed. For rash) 30 g 0  . Coenzyme Q10 (COQ-10) 100 MG CAPS Take 1 capsule by mouth daily.     . Cyanocobalamin (B-12 PO) Take 10,000 mcg by mouth every Monday, Wednesday, and Friday.     Marland Kitchen dexlansoprazole (DEXILANT) 60 MG capsule Take 1 capsule (60 mg total) by mouth daily. 30 capsule 0  . docusate sodium (COLACE) 100 MG capsule Take 100 mg by mouth at bedtime.     . fenofibrate (TRICOR) 145 MG tablet Take 1 tablet (145 mg total) by mouth daily. 90 tablet 2  . fexofenadine (ALLEGRA) 180 MG tablet Take 180 mg by mouth daily.     . Multiple Vitamins-Minerals (MULTIVITAMIN PO) Take 1 tablet by mouth daily.     . simvastatin (ZOCOR) 40 MG tablet Take 1 tablet (40 mg total) by mouth daily. 90 tablet 2  . venlafaxine (EFFEXOR) 75 MG tablet Take 1 tablet (75 mg total) by mouth 2 (two) times daily with a meal. 180 tablet 2  . warfarin (COUMADIN) 2.5 MG tablet Take 1 tablet (2.5 mg total) by mouth daily. As directed 90 tablet 1  . gabapentin (NEURONTIN) 100 MG capsule Take 2 capsules (200 mg total) by mouth 3 (three) times daily. (Patient not taking: Reported on 10/29/2016) 540 capsule 1   No facility-administered medications prior to visit.      Per HPI unless specifically indicated in  ROS section below Review of Systems     Objective:    BP (!) 128/56 (BP Location: Right Arm, Patient Position: Sitting, Cuff Size: Normal)   Pulse 76   Temp 98.8 F (37.1 C) (Oral)   Resp 18   Wt 166 lb 8 oz (75.5 kg)   SpO2 97%   BMI 26.87 kg/m   Wt Readings from Last 3 Encounters:  10/29/16 166 lb 8 oz (75.5 kg)  09/15/16 177 lb 8 oz (80.5 kg)  09/11/16 178 lb (80.7 kg)    Physical Exam  Constitutional: He appears well-developed and well-nourished. No distress.  HENT:  Mouth/Throat: Oropharynx is clear and moist. No oropharyngeal exudate.  Eyes: Conjunctivae and EOM are normal. Pupils are equal, round, and reactive to light.  Neck:  Normal range of motion. Neck supple. No thyromegaly present.  Cardiovascular: Normal rate, regular rhythm, normal heart sounds and intact distal pulses.   No murmur heard. Pulmonary/Chest: Effort normal and breath sounds normal. No respiratory distress. He has no wheezes. He has no rales.  Abdominal: Soft. Bowel sounds are normal. He exhibits no distension and no mass. There is no hepatosplenomegaly. There is tenderness (moderate) in the epigastric area and periumbilical area. There is no rebound, no guarding and no CVA tenderness.  Lymphadenopathy:    He has no cervical adenopathy.  Skin: Skin is warm and dry. No rash noted.  Psychiatric: He has a normal mood and affect.  Nursing note and vitals reviewed.  Results for orders placed or performed in visit on 10/28/16  POCT INR  Result Value Ref Range   INR 2.6       Assessment & Plan:   Problem List Items Addressed This Visit    Dysphagia - Primary    Sore throat with acute worsening of dysphagia x 2 days. No signs of streptococcal infection. ?recurrent stricture. appt with GI scheduled early next month. Will start with barium swallow. Will also contiue dexilant 60mg  daily and add carafate solution with meals. Pt/wife agree with plan.  With epigastric pain - ?esoph spasm, however pt has not been able to tolerate nitro in the past.       Relevant Orders   DG Esophagus       Follow up plan: No Follow-up on file.  Ria Bush, MD

## 2016-10-30 ENCOUNTER — Telehealth: Payer: Self-pay

## 2016-10-30 DIAGNOSIS — Z87440 Personal history of urinary (tract) infections: Secondary | ICD-10-CM | POA: Diagnosis not present

## 2016-10-30 DIAGNOSIS — N3941 Urge incontinence: Secondary | ICD-10-CM | POA: Diagnosis not present

## 2016-10-30 NOTE — Telephone Encounter (Signed)
[name] left v/m that carafate cost to pt was $200.00 which is too expensive.[name] will contact ins co to find sub that is more affordable and se will cb with info.

## 2016-10-31 ENCOUNTER — Ambulatory Visit
Admission: RE | Admit: 2016-10-31 | Discharge: 2016-10-31 | Disposition: A | Discharge status: Home / Self Care | Payer: Medicare Other | Source: Ambulatory Visit | Attending: Family Medicine | Admitting: Family Medicine

## 2016-10-31 ENCOUNTER — Telehealth: Payer: Self-pay | Admitting: Family Medicine

## 2016-10-31 DIAGNOSIS — K222 Esophageal obstruction: Secondary | ICD-10-CM

## 2016-10-31 DIAGNOSIS — R1310 Dysphagia, unspecified: Secondary | ICD-10-CM

## 2016-10-31 DIAGNOSIS — R1319 Other dysphagia: Secondary | ICD-10-CM

## 2016-10-31 NOTE — Telephone Encounter (Signed)
Thank you. Will try to expedite referral back to GI.  [place] with APP.

## 2016-10-31 NOTE — Telephone Encounter (Signed)
Patient's wife,Kitty,called to let Dr.Gutierrez know patient had Barium Swallow done today and there is an obstruction.

## 2016-11-03 NOTE — Telephone Encounter (Signed)
Appt made with [facility] PA on 11/06/16

## 2016-11-06 ENCOUNTER — Encounter: Payer: Self-pay | Admitting: Physician Assistant

## 2016-11-06 ENCOUNTER — Ambulatory Visit (INDEPENDENT_AMBULATORY_CARE_PROVIDER_SITE_OTHER): Payer: Medicare Other | Admitting: Physician Assistant

## 2016-11-06 ENCOUNTER — Telehealth: Payer: Self-pay

## 2016-11-06 VITALS — BP 142/66 | HR 88 | Ht 66.0 in | Wt 169.0 lb

## 2016-11-06 DIAGNOSIS — R933 Abnormal findings on diagnostic imaging of other parts of digestive tract: Secondary | ICD-10-CM

## 2016-11-06 DIAGNOSIS — R1310 Dysphagia, unspecified: Secondary | ICD-10-CM | POA: Diagnosis not present

## 2016-11-06 HISTORY — PX: ESOPHAGOGASTRODUODENOSCOPY: SHX1529

## 2016-11-06 NOTE — Patient Instructions (Signed)

## 2016-11-06 NOTE — Progress Notes (Signed)
Chief Complaint: Dysphagia, Abnormal Barium Esophagram  HPI:  [name] is an 83 year old Caucasian male with past medical history of CAD maintained on Coumadin (last echo 11/13/15 LVEF 60-65%), CAD stage III, depression and previous stroke, as well as multiple others listed below, who was referred to me by Ria Bush, MD for a complaint of dysphagia.    Patient regularly follows with Dr. [name] but had his last EGD with Dr. Carlean Purl on 11/15/15 with finding of benign-appearing esophageal stenosis. Cervical esophagus was tight. No distal stricture. Exam was otherwise normal. Previous to this patient had an EGD 02/20/14 with Dr. [name] which showed a snug GE junction and slightly irregular mucosa over one third of the GE junction, stricture dilated up to 18 mm there is a medium amount of retained solid food in the stomach.   Today, the patient presents to clinic accompanied by his wife and they tell me that he acutely developed symptoms about 2 weeks ago when they were out to eat breakfast. The patient tells me that he ate some "scrambled eggs", and they went down, got stuck and "came back up". Since then the patient has had difficulty with swallowing, mostly solids, though occasionally even liquids. He has been living off of very soft foods and been supplementing with Boost shakes per their PCP's recommendations. The patient tells me that he is somewhat scared to eat as it is "hard for the food to go down". The patient tells me his reflux is well controlled on his Dexilant 60mg  qd.   Patient's medical history is positive for being on Coumadin. He has had to hold this for 5 days before procedures in the past. Patient did have recent fall and broke his hip in the summer of last year, he uses a walker to ambulate now.    Patient's social history is positive for his wife being a Designer, jewellery who retired 5 years ago from primary and emergency medicine. She does take care of him.   Patient denies  fever, chills, blood in the stool, weight loss, fatigue, anorexia, nausea, vomiting, heartburn or reflux.  Past Medical History:  Diagnosis Date  . Allergic rhinitis   . Arthritis   . Asthma    remote  . Blood transfusion 1990's  . CAD (coronary artery disease)    cath 2000 30% single vessel, normal nuclear stress test 12/03/2010, no evidence ischemia  . Candidal urethritis in male 06/15/2015  . CKD (chronic kidney disease) stage 3, GFR 30-59 ml/min    baseline Cr 1.7  . Closed C7 fracture ([place]) 11/06/2014  . DDD (degenerative disc disease) 03/2013   by CT, diffuse multilevel cervical and lumbar spondylosis  . Depression   . DISH (diffuse idiopathic skeletal hyperostosis) 03/2013   lumbar spine on xray  . Dizziness    multifactorial, s/p PT at [facility] 06/2014 with HEP  . DVT (deep venous thrombosis) ([place]) 07/2016  . Elevated PSA    previous-normalized (followed by Dr. Jeffie Pollock, rec no repeat unless urinary sxs)  . Essential tremor 09/27/2008   reviewed eval by Dr Jannifer Franklin in chart 2011   . GERD (gastroesophageal reflux disease)    h/o PUD  . Hearing loss 06/2012   eval - rec annual exam  . History of fracture of right hip   . History of phlebitis   . History of pyelonephritis 05/2014   hospitalization with sepsis  . HLD (hyperlipidemia)    hypertriglyceridemia  . Hx pulmonary embolism 08/1991   negative hypercoagulable panel  12/2014  . Inguinal hernia   . Macrocytic anemia 2014   stable B12/folate and periph smear 05/2013, again periph smear 2017 with mature cells, mild dacrocytosis  . Osteoporosis 11/2013   DEXA T score -2.7  . Prediabetes   . Schatzki's ring 02/2012   s/p dilation [name]  . Sleep apnea    no CPAP- sleep apnea "cleared up 15 years ago"  . Stroke ([place])   . Urge incontinence   . UTI (urinary tract infection) due to Enterococcus 05/13/2015    Past Surgical History:  Procedure Laterality Date  . APPENDECTOMY  1996  . CARDIAC CATHETERIZATION  2000   30% one vessel    . CARDIOVASCULAR STRESS TEST  03/2015   no ischemia, low risk, EF 76%  . CAROTID U/S  12/28/2007   nml  . CATARACT EXTRACTION  09/2009   bilateral  . COLONOSCOPY  1998   [place]  . COLONOSCOPY  07/2010   5 polyps, adenomatous, rec rpt 3 yrs  . COLONOSCOPY  04/2014   3 polyps, adenomatous, f/u open ended given age [name]  . CT ABD W & PELVIS WO CM  07/2001   Scarring of right lung, stable negative o/w  . CT ABD W & PELVIS WO CM  11/2000   ? stones, right LL scarring  . CYSTOSCOPY  1992   for kidney stones  . EEG  11/12/2009   nml  . ESOPHAGOGASTRODUODENOSCOPY  02/2012   dilation of schatzki's ring [name]  . ESOPHAGOGASTRODUODENOSCOPY N/A 11/15/2015   Procedure: ESOPHAGOGASTRODUODENOSCOPY (EGD);  Surgeon: Gatha Mayer, MD;  [facility] ENDOSCOPY;  Service: Endoscopy;  Laterality: N/A;  . [place]   with incidental appendectomy  . HERNIA REPAIR  1979   Left  . HIP PINNING,CANNULATED Left 03/11/2016   Procedure: CANNULATED HIP PINNING;  Surgeon: Rod Can, MD;  Location: [facility];  Service: Orthopedics;  Laterality: Left;  . KNEE ARTHROSCOPY  03/24/02   Right (Dr. Mauri Pole)  . MRI  11/2009   Head, nml  . ORIF FEMORAL NECK FRACTURE W/ DHS Right 08/03/2002   Dr Mauri Pole  . TONSILLECTOMY  1965  . US ECHOCARDIOGRAPHY  12/28/2007   Mild aortic valve calcification EF 55%, basically nml  . V/Q SCAN  06/1999   negative    Current Outpatient Prescriptions  Medication Sig Dispense Refill  . albuterol (PROVENTIL HFA;VENTOLIN HFA) 108 (90 Base) MCG/ACT inhaler Inhale 2 puffs into the lungs every 6 (six) hours as needed for wheezing or shortness of breath. 3 Inhaler 3  . b complex vitamins tablet Take 1 tablet by mouth daily.    . Biotin 2500 MCG CAPS Take 1 capsule by mouth daily.    . Cholecalciferol (VITAMIN D) 2000 UNITS CAPS Take 1 capsule by mouth daily. Reported on 01/30/2016    . clotrimazole (LOTRIMIN) 1 % cream Apply 1 application topically 2 (two) times  daily. (Patient taking differently: Apply 1 application topically 2 (two) times daily as needed. For rash) 30 g 0  . Coenzyme Q10 (COQ-10) 100 MG CAPS Take 1 capsule by mouth daily.     . Cyanocobalamin (B-12 PO) Take 10,000 mcg by mouth every Monday, Wednesday, and Friday.     Marland Kitchen dexlansoprazole (DEXILANT) 60 MG capsule Take 1 capsule (60 mg total) by mouth daily. 30 capsule 0  . docusate sodium (COLACE) 100 MG capsule Take 100 mg by mouth at bedtime.     . fenofibrate (TRICOR) 145 MG tablet Take 1 tablet (145  mg total) by mouth daily. 90 tablet 2  . fexofenadine (ALLEGRA) 180 MG tablet Take 180 mg by mouth daily.     . Multiple Vitamins-Minerals (MULTIVITAMIN PO) Take 1 tablet by mouth daily.     . simvastatin (ZOCOR) 40 MG tablet Take 1 tablet (40 mg total) by mouth daily. 90 tablet 2  . sucralfate (CARAFATE) 1 GM/10ML suspension Take 10 mLs (1 g total) by mouth 3 (three) times daily before meals. 420 mL 0  . venlafaxine (EFFEXOR) 75 MG tablet Take 1 tablet (75 mg total) by mouth 2 (two) times daily with a meal. 180 tablet 2  . warfarin (COUMADIN) 2.5 MG tablet Take 1 tablet (2.5 mg total) by mouth daily. As directed 90 tablet 1   No current facility-administered medications for this visit.     Allergies as of 11/06/2016 - Review Complete 10/29/2016  Allergen Reaction Noted  . Lactose intolerance (gi)  02/20/2014  . Nizatidine    . Sulfadiazine    . Aricept [donepezil hcl] Rash 09/04/2015  . Lipitor [atorvastatin] Rash 09/04/2015  . Penicillins Rash     Family History  Problem Relation Age of Onset  . Stroke Mother   . Hypertension Mother   . Cancer Brother     prostate with mets  . Blindness Brother     legally  . Diabetes Brother   . Pulmonary embolism Sister     from shoulder operation  . Alcohol abuse Brother   . Colon cancer Neg Hx   . Esophageal cancer Neg Hx   . Rectal cancer Neg Hx   . Stomach cancer Neg Hx     Social History   Social History  . Marital  status: Married    Spouse name: N/A  . Number of children: 2  . Years of education: N/A   Occupational History  . 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 Smoking status: Never Smoker  . Smokeless tobacco: Never Used  . Alcohol use No  . Drug use: No  . Sexual activity: Not on file   Other Topics Concern  . Not on file   Social History Narrative   Married with 2 children   Husband of Wissam Giglio    Retired: was principal    Activity: walks dog 2-3 times daily about 30min, frequent stops    Diet: some water, good fruits/vegetables, fish 1x/wk, no sodas.      Advanced directives: Chauncey Reading is wife, [name]. Does not want prolonged life support if terminal      Patient does not drink caffeine.   Patient is right handed.     Review of Systems:    Constitutional: No weight loss, fever or chills Skin: No rash or itching Cardiovascular: No chest pain Respiratory: No SOB  Gastrointestinal: See HPI and otherwise negative Genitourinary: No dysuria or change in urinary frequency Neurological: No headache Musculoskeletal: No new muscle or joint pain Hematologic: No bleeding Psychiatric: Positive for depression   Physical Exam:  Vital signs: BP (!) 142/66   Pulse 88   Ht 5\' 6"  (1.676 m)   Wt 169 lb (76.7 kg)   BMI 27.28 kg/m    Constitutional:   Pleasant Caucasian male appears to be in NAD, Well developed, Well nourished, alert and cooperative Head:  Normocephalic and atraumatic. Eyes:   PEERL, EOMI. No icterus. Conjunctiva pink. Ears:  Normal auditory acuity. Neck:  Supple Throat: Oral cavity and pharynx without inflammation, swelling or  lesion.  Respiratory: Respirations even and unlabored. Lungs clear to auscultation bilaterally.   No wheezes, crackles, or rhonchi.  Cardiovascular: Normal S1, S2. No MRG. Regular rate and rhythm. No peripheral edema, cyanosis or pallor.  Gastrointestinal:  Soft, nondistended, nontender. No rebound  or guarding. Normal bowel sounds. No appreciable masses or hepatomegaly. Rectal:  Not performed.  Msk:  Symmetrical without gross deformities. Without edema, no deformity or joint abnormality. Ambulates with walker Neurologic:  Alert and  oriented x4;  grossly normal neurologically.  Skin:   Dry and intact without significant lesions or rashes. Psychiatric: Demonstrates good judgement and reason without abnormal affect or behaviors.  MOST RECENT LABS AND IMAGING: CBC    Component Value Date/Time   WBC 5.8 09/11/2016 1110   RBC 3.04 (L) 09/11/2016 1110   HGB 11.7 (L) 09/11/2016 1110   HGB 11.0 (L) 04/16/2016 0944   HCT 34.0 (L) 09/11/2016 1110   HCT 32.8 (L) 04/16/2016 0944   PLT 150.0 09/11/2016 1110   PLT 227 04/16/2016 0944   MCV 112.0 Repeated and verified X2. (H) 09/11/2016 1110   MCV 110.4 (H) 04/16/2016 0944   MCH 37.0 (H) 04/16/2016 0944   MCH 37.3 (H) 03/13/2016 0350   MCHC 34.4 09/11/2016 1110   RDW 15.3 09/11/2016 1110   RDW 15.5 (H) 04/16/2016 0944   LYMPHSABS 1.3 09/11/2016 1110   LYMPHSABS 1.0 04/16/2016 0944   MONOABS 0.4 09/11/2016 1110   MONOABS 0.4 04/16/2016 0944   EOSABS 0.1 09/11/2016 1110   EOSABS 0.1 04/16/2016 0944   BASOSABS 0.0 09/11/2016 1110   BASOSABS 0.0 04/16/2016 0944    CMP     Component Value Date/Time   NA 143 09/11/2016 1110   NA 139 04/16/2016 0945   K 4.6 09/11/2016 1110   K 3.9 04/16/2016 0945   CL 107 09/11/2016 1110   CO2 31 09/11/2016 1110   CO2 27 04/16/2016 0945   GLUCOSE 166 (H) 09/11/2016 1110   GLUCOSE 94 04/16/2016 0945   BUN 31 (H) 09/11/2016 1110   BUN 33.6 (H) 04/16/2016 0945   CREATININE 1.60 (H) 09/11/2016 1411   CREATININE 1.5 (H) 04/16/2016 0945   CALCIUM 9.8 09/11/2016 1110   CALCIUM 9.8 04/16/2016 0945   PROT 6.6 09/11/2016 1110   PROT 7.1 04/16/2016 0945   ALBUMIN 3.9 09/11/2016 1110   ALBUMIN 3.6 04/16/2016 0945   AST 43 (H) 09/11/2016 1110   AST 38 (H) 04/16/2016 0945   ALT 38 09/11/2016 1110   ALT  37 04/16/2016 0945   ALKPHOS 42 09/11/2016 1110   ALKPHOS 75 04/16/2016 0945   BILITOT 0.9 09/11/2016 1110   BILITOT 1.10 04/16/2016 0945   GFRNONAA 35 (L) 03/12/2016 0301   GFRAA 41 (L) 03/12/2016 0301   ESOPHOGRAM/BARIUM SWALLOW 10/31/16  TECHNIQUE: Single contrast examination was performed using  thin Stanton Kidney.  FLUOROSCOPY TIME:  Fluoroscopy Time:  0 minutes 24 seconds  Radiation Exposure Index (if provided by the fluoroscopic device): 10.3 mGy  Number of Acquired Spot Images: 16  COMPARISON:  No recent prior .  FINDINGS: This was a limited exam due to the patient's condition . Prominence of the cricopharyngeus muscle noted. Lower cervical esophagus otherwise widely patent. Moderate persistent smooth narrowing of the distal esophagus is noted. This strictures over a 3.2 cm segment. This could represent a a benign or malignant stricture throughout. Given patient's history this may represent a benign stricture. No barium tablet given given the presence of the moderate stricture .  IMPRESSION: 1. Prominent  cricopharyngeus muscle. 2. Moderate distal esophageal smooth stricture over approximately 3.2 cm segment .   Electronically Signed   By: Marcello Moores  Register   On: 10/31/2016 11:42  Assessment: 1. Dysphagia: Patient describes symptoms over the past 2 weeks, even sometimes with liquids with, recent barium swallow as above, history of esophageal strictures last dilated in 2015; likely this is stricture as above 2. Abnormal Barium Swallow: See above  Plan: 1. Scheduled patient for an EGD with dilation in [place] with Dr. [name]. Discussed risks, benefits, limitations and alternatives and the patient agrees to proceed. 2. Did advise the patient to hold his Coumadin for 5 days prior to this procedure. We will discuss holding his Coumadin with his cardiologist. 3. Patient to continue Dexilant 60 mg daily 4. Reviewed anti-dysphagia measures including taking small bites,  drinking sips of water between bites, avoiding distraction while eating and the chin tuck technique 5. Patient to follow in clinic per recommendations from Dr. [name] after time procedure  Ellouise Newer, PA-C [facility] 11/06/2016, 10:47 AM  Cc: Ria Bush, MD

## 2016-11-06 NOTE — Telephone Encounter (Signed)
  11/06/2016   RE: [name] DOB: Sep 15, 1930 MRN: [id number]   Dear Dr. Danise Mina,    We have scheduled the above patient for an endoscopic procedure. Our records show that he is on anticoagulation therapy.   Please advise as to how long the patient may come off his therapy of Coumadin prior to the procedure, which is scheduled for 11/14/2016.  Please fax back/ or route the completed form to [facility] at [phone].   Sincerely,    Phillis Haggis

## 2016-11-06 NOTE — Progress Notes (Signed)
I agree with the above note, plan 

## 2016-11-07 MED ORDER — ENOXAPARIN SODIUM 80 MG/0.8ML ~~LOC~~ SOLN: 80.0000 mg | SUBCUTANEOUS | 0 refills | Status: DC

## 2016-11-07 NOTE — Telephone Encounter (Addendum)
sounds good - can we do lovenox 80mg  dose instead? Thanks.

## 2016-11-07 NOTE — Telephone Encounter (Addendum)
Prescribed bridge procedure:  Sun  11/09/16: Last dose of Coumadin Mon  11/10/16: No Coumadin, No Lovenox Tue  11/11/16: Lovenox 80mg  once daily AM Wed 11/12/16: Lovenox 80mg  once daily AM Thurs 11/13/16: Lovenox 80mg  AM Fri 11/14/16:  PROCEDURE  Sat 11/15/16:  Lovenox 80mg  qd AM                      Coumadin 3.75mg    Sun 11/16/16: Lovenox 80mg  AM                      Coumadin 3.75mg   Monday 11/17/16: Lovenox 80mg  AM                             Coumadin 2.5mg   Tuesday 11/18/16: Recheck INR early am before taking Lovenox or Coumadin.

## 2016-11-07 NOTE — Telephone Encounter (Signed)
Spoke with patient's wife and informed of dosing schedule below.  Only change is that we will adjust lovenox dosing to 80mg  once daily instead of 120mg  per Dr. Danise Mina recommendation.    I did send written instructions to patient's wife through my chart and r/x sent in to [facility] for 80mg  Lovenox once daily, #10 syringes.    Patient wife verbalizes understanding of administration and does not require additional training.  She will call if any further questions.

## 2016-11-07 NOTE — Telephone Encounter (Signed)
Spoke with patient and told him that per Dr. Danise Mina he could hold his Coumadin for 5 days prior to his procedure.  Dr. Bosie Clos is going to contact him about the Lovenox bridge they are setting up.  Patient agreed.

## 2016-11-07 NOTE — Telephone Encounter (Signed)
Dr. Danise Mina,   Here are my recommendations: Patient CrCl 36.8  Sun  11/09/16: Last dose of Coumadin Mon  11/10/16: No Coumadin, No Lovenox Tue  11/11/16: Lovenox 120mg  once daily AM Wed 11/12/16: Lovenox 120mg  once daily AM Thurs 11/13/16: Lovenox 120mg  AM Fri 11/14/16:  PROCEDURE  Sat 11/15/16:  Lovenox 120mg  qd AM                      Coumadin 3.75mg    Sun 11/16/16: Lovenox 120mg  AM                      Coumadin 3.75mg   Monday 11/17/16: Lovenox 120mg  AM                             Coumadin 2.5mg   Tuesday 11/18/16: Recheck INR early am before taking Lovenox or Coumadin.     Please Advise if okay to continue with above plan.   Thanks.

## 2016-11-07 NOTE — Telephone Encounter (Signed)
I recommend hold coumadin for 5 days prior to procedure with lovenox bridge given recent recurrent VTE. Leafy Ro can we set this up for patient?  Thanks

## 2016-11-07 NOTE — Addendum Note (Signed)
Addended by: Magdalen Spatz C on: 11/07/2016 05:20 PM   Modules accepted: Orders

## 2016-11-14 ENCOUNTER — Ambulatory Visit (AMBULATORY_SURGERY_CENTER): Payer: Medicare Other | Admitting: Gastroenterology

## 2016-11-14 ENCOUNTER — Encounter: Payer: Self-pay | Admitting: Gastroenterology

## 2016-11-14 VITALS — BP 119/58 | HR 68 | Temp 97.8°F | Resp 12 | Ht 66.0 in | Wt 169.0 lb

## 2016-11-14 DIAGNOSIS — E785 Hyperlipidemia, unspecified: Secondary | ICD-10-CM | POA: Diagnosis not present

## 2016-11-14 DIAGNOSIS — R1310 Dysphagia, unspecified: Secondary | ICD-10-CM

## 2016-11-14 DIAGNOSIS — R251 Tremor, unspecified: Secondary | ICD-10-CM | POA: Diagnosis not present

## 2016-11-14 DIAGNOSIS — R933 Abnormal findings on diagnostic imaging of other parts of digestive tract: Secondary | ICD-10-CM

## 2016-11-14 DIAGNOSIS — K209 Esophagitis, unspecified without bleeding: Secondary | ICD-10-CM

## 2016-11-14 DIAGNOSIS — K208 Other esophagitis: Secondary | ICD-10-CM | POA: Diagnosis not present

## 2016-11-14 DIAGNOSIS — K222 Esophageal obstruction: Secondary | ICD-10-CM

## 2016-11-14 DIAGNOSIS — F0390 Unspecified dementia without behavioral disturbance: Secondary | ICD-10-CM | POA: Diagnosis not present

## 2016-11-14 DIAGNOSIS — K219 Gastro-esophageal reflux disease without esophagitis: Secondary | ICD-10-CM | POA: Diagnosis not present

## 2016-11-14 MED ORDER — SODIUM CHLORIDE 0.9 % IV SOLN: 500.0000 mL | INTRAVENOUS | Status: AC

## 2016-11-14 NOTE — Progress Notes (Signed)
Verbal order received to start coumadin back tomorrow.

## 2016-11-14 NOTE — Progress Notes (Signed)
Called to room to assist during endoscopic procedure.  Patient ID and intended procedure confirmed with present staff. Received instructions for my participation in the procedure from the performing physician.  

## 2016-11-14 NOTE — Op Note (Signed)
[facility] Patient Name: [name] Procedure Date: 11/14/2016 8:29 AM MRN: [id number] Endoscopist: Milus Banister , MD Age: 83 Referring MD:  Date of Birth: 22-Jan-1931 Gender: Male Account #: [id number] Procedure:                Upper GI endoscopy Indications:              Dysphagia Dysphagia: 01/2012 barium esophagram                            suggested a slight stricture at the GE junction and                            he will need EGD with dilation for this. However                            the esophagram also suggests laryngeal penetration,                            probable aspiration. the radiologist suggested                            formal modi modified barium swallow study with                            speech therapy. This was done 02/2012: The MBSS                            suggest that the main problem is from the                            esophageal stricture (probably does not also have                            oropharyngeal swallowing problem). EGD 02/2012                            found and dilated Schatki's ring (up to 49mm).                            This helped but only briefly, repeat EGD 07/2012                            same finding, dilated to 82mm for 2 minutes. This                            helped. 02/2014 dysphagia returned EGD Dr. [name]                            found irregular distal esophagus (dilated to 55mm,                            biopsies no neoplasia). EGD Dr. Carlean Purl 2017 snug  cervical esophagus, smooth stricture distally,                            dilated with 44 Fr Maloney. 2018 dysphagia,                            abnormal B Esophagram and recurrent dysphagia. Medicines:                Monitored Anesthesia Care Procedure:                Pre-Anesthesia Assessment:                           - Prior to the procedure, a History and Physical                            was  performed, and patient medications and                            allergies were reviewed. The patient's tolerance of                            previous anesthesia was also reviewed. The risks                            and benefits of the procedure and the sedation                            options and risks were discussed with the patient.                            All questions were answered, and informed consent                            was obtained. Prior Anticoagulants: The patient has                            taken Coumadin (warfarin), last dose was 5 days                            prior to procedure. ASA Grade Assessment: III - A                            patient with severe systemic disease. After                            reviewing the risks and benefits, the patient was                            deemed in satisfactory condition to undergo the                            procedure.  After obtaining informed consent, the endoscope was                            passed under direct vision. Throughout the                            procedure, the patient's blood pressure, pulse, and                            oxygen saturations were monitored continuously. The                            Endoscope was introduced through the mouth, and                            advanced to the second part of duodenum. The upper                            GI endoscopy was accomplished without difficulty.                            The patient tolerated the procedure well. Scope In: Scope Out: Findings:                 Localized mild mucosal changes characterized by                            erosions were found in the distal esophagus, about                            1cm above the GE junction. Biopsies were taken with                            a cold forceps for histology.                           One mild benign-appearing, intrinsic stenosis was                             found at the gastroesophageal junction (edematous                            GE junction). A TTS dilator was passed through the                            scope. Dilation with a 16-17-18 mm balloon dilator                            was performed to 18 mm.                           The exam was otherwise without abnormality. Complications:            No immediate complications. Estimated blood loss:  None. Estimated Blood Loss:     Estimated blood loss: none. Impression:               - Eroded mucosa in the esophagus. Biopsied.                           - Benign-appearing esophageal stenosis. Dilated.                           - The examination was otherwise normal. Recommendation:           - Patient has a contact number available for                            emergencies. The signs and symptoms of potential                            delayed complications were discussed with the                            patient. Return to normal activities tomorrow.                            Written discharge instructions were provided to the                            patient.                           - Resume previous diet. Chew your food very well,                            eat slowly and take small bites.                           - Continue present medications.                           - Await pathology results. Milus Banister, MD 11/14/2016 8:55:36 AM This report has been signed electronically.

## 2016-11-14 NOTE — Patient Instructions (Signed)
YOU HAD AN ENDOSCOPIC PROCEDURE TODAY AT [facility]:   Refer to the procedure report that was given to you for any specific questions about what was found during the examination.  If the procedure report does not answer your questions, please call your gastroenterologist to clarify.  If you requested that your care partner not be given the details of your procedure findings, then the procedure report has been included in a sealed envelope for you to review at your convenience later.  YOU SHOULD EXPECT: Some feelings of bloating in the abdomen. Passage of more gas than usual.  Walking can help get rid of the air that was put into your GI tract during the procedure and reduce the bloating. If you had a lower endoscopy (such as a colonoscopy or flexible sigmoidoscopy) you may notice spotting of blood in your stool or on the toilet paper. If you underwent a bowel prep for your procedure, you may not have a normal bowel movement for a few days.  Please Note:  You might notice some irritation and congestion in your nose or some drainage.  This is from the oxygen used during your procedure.  There is no need for concern and it should clear up in a day or so.  SYMPTOMS TO REPORT IMMEDIATELY:     Following upper endoscopy (EGD)  Vomiting of blood or coffee ground material  New chest pain or pain under the shoulder blades  Painful or persistently difficult swallowing  New shortness of breath  Fever of 100F or higher  Black, tarry-looking stools  For urgent or emergent issues, a gastroenterologist can be reached at any hour by calling [phone].   DIET:   Follow Dilation Diet.  ACTIVITY:  You should plan to take it easy for the rest of today and you should NOT DRIVE or use heavy machinery until tomorrow (because of the sedation medicines used during the test).    FOLLOW UP: Our staff will call the number listed on your records the next business day following your procedure to  check on you and address any questions or concerns that you may have regarding the information given to you following your procedure. If we do not reach you, we will leave a message.  However, if you are feeling well and you are not experiencing any problems, there is no need to return our call.  We will assume that you have returned to your regular daily activities without incident.  If any biopsies were taken you will be contacted by phone or by letter within the next 1-3 weeks.  Please call us at [phone] if you have not heard about the biopsies in 3 weeks.    SIGNATURES/CONFIDENTIALITY: You and/or your care partner have signed paperwork which will be entered into your electronic medical record.  These signatures attest to the fact that that the information above on your After Visit Summary has been reviewed and is understood.  Full responsibility of the confidentiality of this discharge information lies with you and/or your care-partner.  Verbal order for patient to restart coumadin tomorrow,resume remainder of medications. Information given on dilation diet.

## 2016-11-14 NOTE — Progress Notes (Signed)
Report to PACU, RN, vss, BBS= Clear.  

## 2016-11-17 ENCOUNTER — Telehealth: Payer: Self-pay | Admitting: *Deleted

## 2016-11-17 ENCOUNTER — Telehealth: Payer: Self-pay

## 2016-11-17 NOTE — Telephone Encounter (Signed)
  Follow up Call-  Call back number 11/14/2016 04/12/2014  Post procedure Call Back phone  # [phone]  Permission to leave phone message Yes Yes  Some recent data might be hidden     Patient questions:  Do you have a fever, pain , or abdominal swelling? No. Pain Score  0 *  Have you tolerated food without any problems? Yes.    Have you been able to return to your normal activities? yes  Do you have any questions about your discharge instructions: Diet   No. Medications  No. Follow up visit  No.  Do you have questions or concerns about your Care? No.  Actions: * If pain score is 4 or above: No action needed, pain <4. Spoke with wife who states she has  no issues or problems

## 2016-11-17 NOTE — Telephone Encounter (Signed)
Mrs. Cihlar notified as instructed by telephone.  Appointment scheduled with Leafy Ro in the coumadin clinic for 11/18/2016 at 4:30 pm.

## 2016-11-17 NOTE — Telephone Encounter (Signed)
Yes, he should take it and Leafy Ro will stop it if protime therapeutic again

## 2016-11-17 NOTE — Telephone Encounter (Signed)
Pt left v/m; pt was supposed to see Leafy Ro RN 11/18/16 and Mrs Mceuen wants to speak with someone if pt should take Lovenox inj in the morning.

## 2016-11-18 ENCOUNTER — Ambulatory Visit: Payer: Medicare Other

## 2016-11-18 ENCOUNTER — Ambulatory Visit (INDEPENDENT_AMBULATORY_CARE_PROVIDER_SITE_OTHER): Payer: Medicare Other

## 2016-11-18 DIAGNOSIS — I639 Cerebral infarction, unspecified: Secondary | ICD-10-CM

## 2016-11-18 LAB — POCT INR: INR: 1.7

## 2016-11-18 NOTE — Patient Instructions (Signed)
Pre visit review using our clinic review tool, if applicable. No additional management support is needed unless otherwise documented below in the visit note. 

## 2016-11-20 DIAGNOSIS — N3941 Urge incontinence: Secondary | ICD-10-CM | POA: Diagnosis not present

## 2016-11-21 ENCOUNTER — Ambulatory Visit (INDEPENDENT_AMBULATORY_CARE_PROVIDER_SITE_OTHER): Payer: Medicare Other

## 2016-11-21 DIAGNOSIS — Z5181 Encounter for therapeutic drug level monitoring: Secondary | ICD-10-CM

## 2016-11-21 DIAGNOSIS — I639 Cerebral infarction, unspecified: Secondary | ICD-10-CM | POA: Diagnosis not present

## 2016-11-21 DIAGNOSIS — S72092D Other fracture of head and neck of left femur, subsequent encounter for closed fracture with routine healing: Secondary | ICD-10-CM | POA: Diagnosis not present

## 2016-11-21 DIAGNOSIS — M7062 Trochanteric bursitis, left hip: Secondary | ICD-10-CM | POA: Diagnosis not present

## 2016-11-21 LAB — POCT INR: INR: 2.2

## 2016-11-21 NOTE — Patient Instructions (Signed)
Pre visit review using our clinic review tool, if applicable. No additional management support is needed unless otherwise documented below in the visit note. 

## 2016-11-24 ENCOUNTER — Encounter: Payer: Self-pay | Admitting: Family Medicine

## 2016-11-24 ENCOUNTER — Telehealth: Payer: Self-pay | Admitting: Gastroenterology

## 2016-11-24 NOTE — Telephone Encounter (Signed)
See pathology report for additional details

## 2016-11-25 ENCOUNTER — Ambulatory Visit: Payer: Medicare Other

## 2016-11-27 DIAGNOSIS — N3941 Urge incontinence: Secondary | ICD-10-CM | POA: Diagnosis not present

## 2016-12-04 DIAGNOSIS — N3941 Urge incontinence: Secondary | ICD-10-CM | POA: Diagnosis not present

## 2016-12-08 DIAGNOSIS — M7072 Other bursitis of hip, left hip: Secondary | ICD-10-CM | POA: Diagnosis not present

## 2016-12-08 DIAGNOSIS — M25552 Pain in left hip: Secondary | ICD-10-CM | POA: Diagnosis not present

## 2016-12-09 ENCOUNTER — Ambulatory Visit (INDEPENDENT_AMBULATORY_CARE_PROVIDER_SITE_OTHER): Payer: Medicare Other

## 2016-12-09 ENCOUNTER — Other Ambulatory Visit: Payer: Self-pay | Admitting: *Deleted

## 2016-12-09 DIAGNOSIS — I639 Cerebral infarction, unspecified: Secondary | ICD-10-CM | POA: Diagnosis not present

## 2016-12-09 LAB — POCT INR: INR: 3.2

## 2016-12-09 MED ORDER — SIMVASTATIN 40 MG PO TABS: 40.0000 mg | ORAL_TABLET | Freq: Every day | ORAL | 3 refills | Status: AC

## 2016-12-09 MED ORDER — FENOFIBRATE 145 MG PO TABS: 145.0000 mg | ORAL_TABLET | Freq: Every day | ORAL | 0 refills | Status: DC

## 2016-12-09 MED ORDER — FENOFIBRATE 145 MG PO TABS: 145.0000 mg | ORAL_TABLET | Freq: Every day | ORAL | 3 refills | Status: DC

## 2016-12-09 MED ORDER — SIMVASTATIN 40 MG PO TABS: 40.0000 mg | ORAL_TABLET | Freq: Every day | ORAL | 0 refills | Status: DC

## 2016-12-09 NOTE — Telephone Encounter (Signed)
Sent in

## 2016-12-09 NOTE — Patient Instructions (Signed)
Pre visit review using our clinic review tool, if applicable. No additional management support is needed unless otherwise documented below in the visit note. 

## 2016-12-09 NOTE — Telephone Encounter (Signed)
pts wife came into office and requested medication refill. Last labs 11/2015-abnormal. pls advise  Wife is also wanting 7 day supply sent to [facility] for simvastatin as pt is completely out.

## 2016-12-11 DIAGNOSIS — M7072 Other bursitis of hip, left hip: Secondary | ICD-10-CM | POA: Diagnosis not present

## 2016-12-11 DIAGNOSIS — M25552 Pain in left hip: Secondary | ICD-10-CM | POA: Diagnosis not present

## 2016-12-11 DIAGNOSIS — N3941 Urge incontinence: Secondary | ICD-10-CM | POA: Diagnosis not present

## 2016-12-16 DIAGNOSIS — M7072 Other bursitis of hip, left hip: Secondary | ICD-10-CM | POA: Diagnosis not present

## 2016-12-16 DIAGNOSIS — M25552 Pain in left hip: Secondary | ICD-10-CM | POA: Diagnosis not present

## 2016-12-18 DIAGNOSIS — N3941 Urge incontinence: Secondary | ICD-10-CM | POA: Diagnosis not present

## 2016-12-19 DIAGNOSIS — M7072 Other bursitis of hip, left hip: Secondary | ICD-10-CM | POA: Diagnosis not present

## 2016-12-19 DIAGNOSIS — M25552 Pain in left hip: Secondary | ICD-10-CM | POA: Diagnosis not present

## 2016-12-23 DIAGNOSIS — M7072 Other bursitis of hip, left hip: Secondary | ICD-10-CM | POA: Diagnosis not present

## 2016-12-23 DIAGNOSIS — M25552 Pain in left hip: Secondary | ICD-10-CM | POA: Diagnosis not present

## 2016-12-25 DIAGNOSIS — N3941 Urge incontinence: Secondary | ICD-10-CM | POA: Diagnosis not present

## 2016-12-26 DIAGNOSIS — M25552 Pain in left hip: Secondary | ICD-10-CM | POA: Diagnosis not present

## 2016-12-26 DIAGNOSIS — H16123 Filamentary keratitis, bilateral: Secondary | ICD-10-CM | POA: Diagnosis not present

## 2016-12-26 DIAGNOSIS — M7072 Other bursitis of hip, left hip: Secondary | ICD-10-CM | POA: Diagnosis not present

## 2016-12-29 DIAGNOSIS — M7072 Other bursitis of hip, left hip: Secondary | ICD-10-CM | POA: Diagnosis not present

## 2016-12-29 DIAGNOSIS — M25552 Pain in left hip: Secondary | ICD-10-CM | POA: Diagnosis not present

## 2016-12-30 ENCOUNTER — Ambulatory Visit (INDEPENDENT_AMBULATORY_CARE_PROVIDER_SITE_OTHER): Payer: Medicare Other

## 2016-12-30 DIAGNOSIS — I639 Cerebral infarction, unspecified: Secondary | ICD-10-CM | POA: Diagnosis not present

## 2016-12-30 LAB — POCT INR: INR: 3

## 2016-12-30 NOTE — Patient Instructions (Signed)
Pre visit review using our clinic review tool, if applicable. No additional management support is needed unless otherwise documented below in the visit note. 

## 2017-01-01 DIAGNOSIS — N3941 Urge incontinence: Secondary | ICD-10-CM | POA: Diagnosis not present

## 2017-01-02 DIAGNOSIS — M7072 Other bursitis of hip, left hip: Secondary | ICD-10-CM | POA: Diagnosis not present

## 2017-01-02 DIAGNOSIS — M7062 Trochanteric bursitis, left hip: Secondary | ICD-10-CM | POA: Diagnosis not present

## 2017-01-02 DIAGNOSIS — M25552 Pain in left hip: Secondary | ICD-10-CM | POA: Diagnosis not present

## 2017-01-02 DIAGNOSIS — S72092D Other fracture of head and neck of left femur, subsequent encounter for closed fracture with routine healing: Secondary | ICD-10-CM | POA: Diagnosis not present

## 2017-01-05 DIAGNOSIS — M7072 Other bursitis of hip, left hip: Secondary | ICD-10-CM | POA: Diagnosis not present

## 2017-01-05 DIAGNOSIS — M25552 Pain in left hip: Secondary | ICD-10-CM | POA: Diagnosis not present

## 2017-01-06 ENCOUNTER — Other Ambulatory Visit: Payer: Self-pay | Admitting: Family Medicine

## 2017-01-06 NOTE — Telephone Encounter (Signed)
Received refill request electronically Last refill 01/10/16 #180/2 Last office visit 10/29/16 Please advise when patient needs a physical

## 2017-01-07 DIAGNOSIS — H01003 Unspecified blepharitis right eye, unspecified eyelid: Secondary | ICD-10-CM | POA: Diagnosis not present

## 2017-01-08 DIAGNOSIS — N3941 Urge incontinence: Secondary | ICD-10-CM | POA: Diagnosis not present

## 2017-01-11 NOTE — Progress Notes (Signed)
[facility]    HEMATOLOGY/ONCOLOGY CLINIC NOTE  Date of Service: .04/16/2016  Patient Care Team: Ria Bush, MD as PCP - General  CHIEF COMPLAINTS/PURPOSE OF CONSULTATION:   Anemia Thrombocytopenia, elevated factor VIII, history of pulmonary embolism. Ischemic CVA.   HISTORY OF PRESENTING ILLNESS:   [name] is a wonderful 83 y.o. male who has been referred to [place] by Dr .Ria Bush, MD for evaluation and management of elevated factor VIII , thrombocytopenia, history of pulmonary embolism and ischemic CVA.  Patient has a history of multiple medical comorbidities including hypertension, dyslipidemia, coronary artery disease, CKD stage III, sleep apnea, prediabetes and a significant pulmonary embolism in the early 1990s which caused him to pass out. He notes that he had received TPA. He reports some family history of venous thromboembolism and his mother, 2 brothers with DVTs in their 80s who were overweight. Sister had pulmonary embolism after shoulder surgery at age 68 years.. Patient reports he was chronically on Coumadin until August 2016 when he is Coumadin was stopped (?fall risk) and he was switched to aspirin 81 mg by mouth daily.  Patient was recently admitted in early March 2017 with an acute ischemic left parietal infarct leading to speech impairment that was subsequently resolved. No focal neurological weakness. He was replaced on Coumadin due to uncertainty regarding whether this was an atherosclerotic event versus related to a hypercoagulable state. Of note the patient had a hypercoagulable panel by his primary care physician in April 2016 that was unremarkable.  Patient's primary cardiologist is Dr. Leonie Man.  He has had some chronic macrocytic anemia with hemoglobin varying from 10-12.3 from 2015 with macrocytic indices with normal RDW. Platelets elevated from 66k to 145k during this time. Labs today show a hemoglobin of 11.5 with an MCV of 113.2 and normal RDW and  platelets of 113k. Patient notes no issues with infections. No overt GI bleeding. No other overt blood loss.  No evidence of liver disease. Patient previously had a CT of the abdomen 2015 that showed no splenomegaly. TSH was within normal limits in January 2017.  INTERVAL HISTORY  Patient is here for a f/u of his Anemia and thrombocytopenia. Since his last visit he had a fall with left hip fracture requiring IM nailing. Notes some BRB per rectum in setting of supratherapeutic INR of 5.5. He notes that his coumadin has been switched to ASA. No further bleeding since being on ASA off coumadin.  MEDICAL HISTORY:  Past Medical History:  Diagnosis Date  . Allergic rhinitis   . Arthritis   . Asthma    remote  . Blood transfusion 1990's  . CAD (coronary artery disease)    cath 2000 30% single vessel, normal nuclear stress test 12/03/2010, no evidence ischemia  . Candidal urethritis in male 06/15/2015  . CKD (chronic kidney disease) stage 3, GFR 30-59 ml/min    baseline Cr 1.7  . Closed C7 fracture ([place]) 11/06/2014  . DDD (degenerative disc disease) 03/2013   by CT, diffuse multilevel cervical and lumbar spondylosis  . Depression   . DISH (diffuse idiopathic skeletal hyperostosis) 03/2013   lumbar spine on xray  . Dizziness    multifactorial, s/p PT at [facility] 06/2014 with HEP  . DVT (deep venous thrombosis) ([place]) 07/2016  . Elevated PSA    previous-normalized (followed by Dr. Jeffie Pollock, rec no repeat unless urinary sxs)  . Essential tremor 09/27/2008   reviewed eval by Dr Jannifer Franklin in chart 2011   . GERD (gastroesophageal reflux disease)  h/o PUD  . Hearing loss 06/2012   eval - rec annual exam  . History of fracture of right hip   . History of phlebitis   . History of pyelonephritis 05/2014   hospitalization with sepsis  . HLD (hyperlipidemia)    hypertriglyceridemia  . Hx pulmonary embolism 08/1991   negative hypercoagulable panel 12/2014  . Inguinal hernia   . Macrocytic anemia 2014    stable B12/folate and periph smear 05/2013, again periph smear 2017 with mature cells, mild dacrocytosis  . Osteoporosis 11/2013   DEXA T score -2.7  . Prediabetes   . Schatzki's ring 02/2012   s/p dilation Christella Hartigan)  . Sleep apnea    no CPAP- sleep apnea "cleared up 15 years ago"  . Stroke (HCC)   . Urge incontinence   . UTI (urinary tract infection) due to Enterococcus 05/13/2015    SURGICAL HISTORY: Past Surgical History:  Procedure Laterality Date  . APPENDECTOMY  1996  . CARDIAC CATHETERIZATION  2000   30% one vessel  . CARDIOVASCULAR STRESS TEST  03/2015   no ischemia, low risk, EF 76%  . CAROTID U/S  12/28/2007   nml  . CATARACT EXTRACTION  09/2009   bilateral  . COLONOSCOPY  1998   [place]  . COLONOSCOPY  07/2010   5 polyps, adenomatous, rec rpt 3 yrs  . COLONOSCOPY  04/2014   3 polyps, adenomatous, f/u open ended given age Christella Hartigan)  . CT ABD W & PELVIS WO CM  07/2001   Scarring of right lung, stable negative o/w  . CT ABD W & PELVIS WO CM  11/2000   ? stones, right LL scarring  . CYSTOSCOPY  1992   for kidney stones  . EEG  11/12/2009   nml  . ESOPHAGOGASTRODUODENOSCOPY  02/2012   dilation of schatzki's ring Christella Hartigan)  . ESOPHAGOGASTRODUODENOSCOPY N/A 11/15/2015   Procedure: ESOPHAGOGASTRODUODENOSCOPY (EGD);  Surgeon: Iva Boop, MD;  Location[facility];  Service: Endoscopy;  Laterality: N/A;  . ESOPHAGOGASTRODUODENOSCOPY  11/2016   esophageal erosions, GERD, esophageal stenosis dilated Christella Hartigan)  . EXPLORATORY LAPAROTOMY  1996   with incidental appendectomy  . HERNIA REPAIR  1979   Left  . HIP PINNING,CANNULATED Left 03/11/2016   Procedure: CANNULATED HIP PINNING;  Surgeon: Samson Frederic, MD;  Location: [facility] OR;  Service: Orthopedics;  Laterality: Left;  . KNEE ARTHROSCOPY  03/24/02   Right (Dr. Gerrit Heck)  . MRI  11/2009   Head, nml  . ORIF FEMORAL NECK FRACTURE W/ DHS Right 08/03/2002   Dr Gerrit Heck  . TONSILLECTOMY  1965  . US ECHOCARDIOGRAPHY  12/28/2007    Mild aortic valve calcification EF 55%, basically nml  . V/Q SCAN  06/1999   negative    SOCIAL HISTORY: Social History   Social History  . Marital status: Married    Spouse name: N/A  . Number of children: 2  . Years of education: N/A   Occupational History  . 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 Smoking status: Never Smoker  . Smokeless tobacco: Never Used  . Alcohol use No  . Drug use: No  . Sexual activity: Not on file   Other Topics Concern  . Not on file   Social History Narrative   Married with 2 children   Husband of Kennie Snedden    Retired: was principal    Activity: walks dog 2-3 times daily about , frequent stops    Diet:  some water, good fruits/vegetables, fish 1x/wk, no sodas.      Advanced directives: Chauncey Reading is wife, [name]. Does not want prolonged life support if terminal      Patient does not drink caffeine.   Patient is right handed.     FAMILY HISTORY: Family History  Problem Relation Age of Onset  . Stroke Mother   . Hypertension Mother   . Cancer Brother     prostate with mets  . Blindness Brother     legally  . Diabetes Brother   . Pulmonary embolism Sister     from shoulder operation  . Alcohol abuse Brother   . Colon cancer Neg Hx   . Esophageal cancer Neg Hx   . Rectal cancer Neg Hx   . Stomach cancer Neg Hx     ALLERGIES:  is allergic to lactose intolerance (gi); nizatidine; sulfadiazine; aricept [donepezil hcl]; lipitor [atorvastatin]; and penicillins.  MEDICATIONS:  Current Outpatient Prescriptions  Medication Sig Dispense Refill  . albuterol (PROVENTIL HFA;VENTOLIN HFA) 108 (90 Base) MCG/ACT inhaler Inhale 2 puffs into the lungs every 6 (six) hours as needed for wheezing or shortness of breath. 3 Inhaler 3  . b complex vitamins tablet Take 1 tablet by mouth daily.    . Biotin 2500 MCG CAPS Take 1 capsule by mouth daily.    . Cholecalciferol (VITAMIN D) 2000 UNITS CAPS Take 1  capsule by mouth daily. Reported on 01/30/2016    . clotrimazole (LOTRIMIN) 1 % cream Apply 1 application topically 2 (two) times daily. (Patient taking differently: Apply 1 application topically 2 (two) times daily as needed. For rash) 30 g 0  . Coenzyme Q10 (COQ-10) 100 MG CAPS Take 1 capsule by mouth daily.     . Cyanocobalamin (B-12 PO) Take 10,000 mcg by mouth every Monday, Wednesday, and Friday.     Marland Kitchen dexlansoprazole (DEXILANT) 60 MG capsule Take 1 capsule (60 mg total) by mouth daily. 30 capsule 0  . docusate sodium (COLACE) 100 MG capsule Take 100 mg by mouth at bedtime.     . enoxaparin (LOVENOX) 80 MG/0.8ML injection Inject 0.8 mLs (80 mg total) into the skin daily. 10 Syringe 0  . fenofibrate (TRICOR) 145 MG tablet Take 1 tablet (145 mg total) by mouth daily. 90 tablet 3  . fexofenadine (ALLEGRA) 180 MG tablet Take 180 mg by mouth daily.     . Multiple Vitamins-Minerals (MULTIVITAMIN PO) Take 1 tablet by mouth daily.     . simvastatin (ZOCOR) 40 MG tablet Take 1 tablet (40 mg total) by mouth daily. 90 tablet 3  . venlafaxine (EFFEXOR) 75 MG tablet TAKE ONE TABLET TWICE DAILY WITH MEALS 60 tablet 6  . warfarin (COUMADIN) 2.5 MG tablet Take 1 tablet (2.5 mg total) by mouth daily. As directed 90 tablet 1   Current Facility-Administered Medications  Medication Dose Route Frequency Provider Last Rate Last Dose  . 0.9 %  sodium chloride infusion  500 mL Intravenous Continuous Milus Banister, MD        REVIEW OF SYSTEMS:    10 Point review of Systems was done is negative except as noted above.  PHYSICAL EXAMINATION: ECOG PERFORMANCE STATUS: 2 - Symptomatic, <50% confined to bed  . Vitals:   04/16/16 1047  BP: (!) 117/49  Pulse: 82  Resp: 18  Temp: 98.3 F (36.8 C)   Filed Weights   04/16/16 1047  Weight: 161 lb (73 kg)   .Body mass index is 25.99 kg/m.  GENERAL:alert, in  no acute distress and comfortable SKIN: skin color, texture, turgor are normal, no rashes or  significant lesions EYES: normal, conjunctiva are pink and non-injected, sclera clear OROPHARYNX:no exudate, no erythema and lips, buccal mucosa, and tongue normal  NECK: supple, no JVD, thyroid normal size, non-tender, without nodularity LYMPH:  no palpable lymphadenopathy in the cervical, axillary or inguinal LUNGS: clear to auscultation with normal respiratory effort HEART: regular rate & rhythm,  no murmurs and no lower extremity edema ABDOMEN: abdomen soft, non-tender, normoactive bowel sounds , No palpable hepatosplenomegaly. Musculoskeletal: no cyanosis of digits and no clubbing  PSYCH: alert & oriented x 3 with fluent speech NEURO: no focal motor/sensory deficits  LABORATORY DATA:  I have reviewed the data as listed  Component     Latest Ref Rng & Units 12/14/2015 12/14/2015 04/16/2016        10:50 AM 10:50 AM   WBC     4.0 - 10.3 10e3/uL 5.6  5.9  NEUT#     1.5 - 6.5 10e3/uL 3.7  4.5  Hemoglobin     13.0 - 17.1 g/dL 11.5 (L)  11.0 (L)  HCT     38.4 - 49.9 % 34.4 (L) 34.1 (L) 32.8 (L)  Platelets     140 - 400 10e3/uL 113 (L)  227  MCV     79.3 - 98.0 fL 113.2 (H)  110.4 (H)  MCH     27.2 - 33.4 pg 37.8 (H)  37.0 (H)  MCHC     32.0 - 36.0 g/dL 33.4  33.5  RBC     4.20 - 5.82 10e6/uL 3.04 (L)  2.97 (L)  RDW     11.0 - 14.6 % 14.6  15.5 (H)  lymph#     0.9 - 3.3 10e3/uL 1.3  1.0  MONO#     0.1 - 0.9 10e3/uL 0.5  0.4  Eosinophils Absolute     0.0 - 0.5 10e3/uL 0.2  0.1  Basophils Absolute     0.0 - 0.1 10e3/uL 0.0  0.0  NEUT%     39.0 - 75.0 % 65.7  75.1 (H)  LYMPH%     14.0 - 49.0 % 23.1  16.9  MONO%     0.0 - 14.0 % 8.0  6.0  EOS%     0.0 - 7.0 % 2.8  1.2  BASO%     0.0 - 2.0 % 0.4  0.8  Retic %     0.80 - 1.80 % 3.07 (H)    Retic Ct Abs     34.80 - 93.90 10e3/uL 93.33    Immature Retic Fract     3.00 - 10.60 % 12.80 (H)    Sodium     136 - 145 mEq/L   139  Potassium     3.5 - 5.1 mEq/L   3.9  Chloride     98 - 109 mEq/L   104  CO2     22 - 29  mEq/L   27  Glucose     70 - 140 mg/dl   94  BUN     7.0 - 26.0 mg/dL   33.6 (H)  Creatinine     0.7 - 1.3 mg/dL   1.5 (H)  Total Bilirubin     0.20 - 1.20 mg/dL   1.10  Alkaline Phosphatase     40 - 150 U/L   75  AST     5 - 34 U/L   38 (H)  ALT  0 - 55 U/L   37  Total Protein     6.4 - 8.3 g/dL   7.1  Albumin     3.5 - 5.0 g/dL   3.6  Calcium     8.4 - 10.4 mg/dL   9.8  Anion gap     3 - 11 mEq/L   9  EGFR     >90 ml/min/1.73 m2   43 (L)  IgG (Immunoglobin G), Serum     700 - 1,600 mg/dL 815    IgA/Immunoglobulin A, Serum     61 - 437 mg/dL 238    IgM, Qn, Serum     15 - 143 mg/dL 50    Total Protein     6.0 - 8.5 g/dL 6.1    Albumin SerPl Elph-Mcnc     2.9 - 4.4 g/dL 3.5    Alpha 1     0.0 - 0.4 g/dL 0.2    Alpha2 Glob SerPl Elph-Mcnc     0.4 - 1.0 g/dL 0.5    B-Globulin SerPl Elph-Mcnc     0.7 - 1.3 g/dL 1.1    Gamma Glob SerPl Elph-Mcnc     0.4 - 1.8 g/dL 0.7    M Protein SerPl Elph-Mcnc     Not Observed g/dL Not Observed    Globulin, Total     2.2 - 3.9 g/dL 2.6    Albumin/Glob SerPl     0.7 - 1.7 1.4    IFE 1      Comment    Please Note (HCV):      Comment    Folate, Hemolysate     Not Estab. ng/mL  512.8   Folate, RBC     >498 ng/mL  1,504   LDH     125 - 245 U/L 153    Haptoglobin     34 - 200 mg/dL 59    Vitamin B12     211 - 946 pg/mL 1,488 (H)    Thiamine     66.5 - 200.0 nmol/L 112.3    Ferritin     22 - 316 ng/ml   403 (H)     RADIOGRAPHIC STUDIES: I have personally reviewed the radiological images as listed and agreed with the findings in the report. No results found.  ASSESSMENT & PLAN:   83 year old gentleman with  #1 macrocytic anemia - chronic. - stable  Likely multifactorial. Likely from his chronic kidney disease. Macrocytosis with normal RDW likely related to some element of MDS. (Peripheral blood smear with some increasing acanthocytes and pelgeroid neutrophils suggestive of MDS) Myeloma panel with no M  protein LDH and haptoglobin within normal limits suggesting against any evidence of hemolysis. No significant reticulocytosis to suggest active bleeding at this time.  #2 mild thrombocytopenia - now resolved PLT 227k No evidence of platelet clumping on his peripheral blood smear. Could be from some element of MDS. No indication for treatment at this time. Plan -Patient likely has some element of MDS and anemia of chronic disease due to his chronic kidney disease. -Would recommend treating with oral iron to keep the ferritin more than 100 in the setting of anemia of chronic to kidney disease especially if hgb<10 and ESA's contemplated. -Avoid NSAIDs or other medications causing thrombocytopenia.   #3 patient with a remote history of extensive pulmonary embolism and DVT in 1992. He has been on anticoagulation for more than 20 yrs. Hypercoagulable workup by his primary care physician April 2016 was unrevealing. He  has had elevated factor VIII levels - which are not indicative of risk one way or another and were likely reactively increased. Patient has family history of DVT and PE. And his first event was significant an unprovoked -both of these factors would justify ongoing anticoagulation if no contraindications exist. Unclear that his recent CVA was related to  a hypercoagulable state. Plan -since last visit due to significant fall and rectal bleeding issues the patient has been transitioned from coumadin to ASA as per cardiology.  Continue follow-up with primary care physician  We shall see him back only an an as needed basis.  All of the patients questions were answered with apparent satisfaction. The patient knows to call the clinic with any problems, questions or concerns.  I spent 20 minutes counseling the patient face to face. The total time spent in the appointment was 20 minutes and more than 50% was on counseling and direct patient cares.    Sullivan Lone MD [place] AAHIVMS [facility]  [facility] Hematology/Oncology Physician [facility]  (Office):       [phone] (Work cell):  [phone] (Fax):           [phone]

## 2017-01-19 DIAGNOSIS — M25552 Pain in left hip: Secondary | ICD-10-CM | POA: Diagnosis not present

## 2017-01-19 DIAGNOSIS — M7072 Other bursitis of hip, left hip: Secondary | ICD-10-CM | POA: Diagnosis not present

## 2017-01-21 DIAGNOSIS — M7072 Other bursitis of hip, left hip: Secondary | ICD-10-CM | POA: Diagnosis not present

## 2017-01-21 DIAGNOSIS — M25552 Pain in left hip: Secondary | ICD-10-CM | POA: Diagnosis not present

## 2017-01-22 DIAGNOSIS — N3941 Urge incontinence: Secondary | ICD-10-CM | POA: Diagnosis not present

## 2017-01-26 DIAGNOSIS — M25552 Pain in left hip: Secondary | ICD-10-CM | POA: Diagnosis not present

## 2017-01-26 DIAGNOSIS — L821 Other seborrheic keratosis: Secondary | ICD-10-CM | POA: Diagnosis not present

## 2017-01-26 DIAGNOSIS — L812 Freckles: Secondary | ICD-10-CM | POA: Diagnosis not present

## 2017-01-26 DIAGNOSIS — I788 Other diseases of capillaries: Secondary | ICD-10-CM | POA: Diagnosis not present

## 2017-01-26 DIAGNOSIS — D1800 Hemangioma unspecified site: Secondary | ICD-10-CM | POA: Diagnosis not present

## 2017-01-26 DIAGNOSIS — M7072 Other bursitis of hip, left hip: Secondary | ICD-10-CM | POA: Diagnosis not present

## 2017-01-26 DIAGNOSIS — L578 Other skin changes due to chronic exposure to nonionizing radiation: Secondary | ICD-10-CM | POA: Diagnosis not present

## 2017-01-26 DIAGNOSIS — D229 Melanocytic nevi, unspecified: Secondary | ICD-10-CM | POA: Diagnosis not present

## 2017-01-26 DIAGNOSIS — Z1283 Encounter for screening for malignant neoplasm of skin: Secondary | ICD-10-CM | POA: Diagnosis not present

## 2017-01-27 ENCOUNTER — Ambulatory Visit: Payer: Medicare Other

## 2017-01-27 ENCOUNTER — Ambulatory Visit (INDEPENDENT_AMBULATORY_CARE_PROVIDER_SITE_OTHER): Payer: Medicare Other

## 2017-01-27 DIAGNOSIS — I639 Cerebral infarction, unspecified: Secondary | ICD-10-CM

## 2017-01-27 LAB — POCT INR: INR: 2.9

## 2017-01-27 NOTE — Patient Instructions (Signed)
Pre visit review using our clinic review tool, if applicable. No additional management support is needed unless otherwise documented below in the visit note. 

## 2017-01-29 DIAGNOSIS — N3941 Urge incontinence: Secondary | ICD-10-CM | POA: Diagnosis not present

## 2017-01-30 DIAGNOSIS — M7072 Other bursitis of hip, left hip: Secondary | ICD-10-CM | POA: Diagnosis not present

## 2017-01-30 DIAGNOSIS — M25552 Pain in left hip: Secondary | ICD-10-CM | POA: Diagnosis not present

## 2017-02-02 ENCOUNTER — Other Ambulatory Visit: Payer: Self-pay | Admitting: Gastroenterology

## 2017-02-02 DIAGNOSIS — M25552 Pain in left hip: Secondary | ICD-10-CM | POA: Diagnosis not present

## 2017-02-02 DIAGNOSIS — M7072 Other bursitis of hip, left hip: Secondary | ICD-10-CM | POA: Diagnosis not present

## 2017-02-04 ENCOUNTER — Telehealth: Payer: Self-pay | Admitting: Gastroenterology

## 2017-02-04 MED ORDER — DEXLANSOPRAZOLE 60 MG PO CPDR: 1.0000 | DELAYED_RELEASE_CAPSULE | Freq: Every day | ORAL | 0 refills | Status: DC

## 2017-02-04 NOTE — Telephone Encounter (Signed)
15 days worth of Dexilant was sent to the local pharmacy as requested

## 2017-02-05 DIAGNOSIS — N3941 Urge incontinence: Secondary | ICD-10-CM | POA: Diagnosis not present

## 2017-02-06 DIAGNOSIS — M7072 Other bursitis of hip, left hip: Secondary | ICD-10-CM | POA: Diagnosis not present

## 2017-02-06 DIAGNOSIS — M25552 Pain in left hip: Secondary | ICD-10-CM | POA: Diagnosis not present

## 2017-02-10 ENCOUNTER — Other Ambulatory Visit: Payer: Self-pay | Admitting: Orthopedic Surgery

## 2017-02-10 DIAGNOSIS — S72092D Other fracture of head and neck of left femur, subsequent encounter for closed fracture with routine healing: Secondary | ICD-10-CM | POA: Diagnosis not present

## 2017-02-10 DIAGNOSIS — G8929 Other chronic pain: Secondary | ICD-10-CM | POA: Diagnosis not present

## 2017-02-10 DIAGNOSIS — M7062 Trochanteric bursitis, left hip: Secondary | ICD-10-CM | POA: Diagnosis not present

## 2017-02-10 DIAGNOSIS — M25552 Pain in left hip: Principal | ICD-10-CM

## 2017-02-11 DIAGNOSIS — M7072 Other bursitis of hip, left hip: Secondary | ICD-10-CM | POA: Diagnosis not present

## 2017-02-11 DIAGNOSIS — M25552 Pain in left hip: Secondary | ICD-10-CM | POA: Diagnosis not present

## 2017-02-12 ENCOUNTER — Ambulatory Visit
Admission: RE | Admit: 2017-02-12 | Discharge: 2017-02-12 | Disposition: A | Discharge status: Home / Self Care | Payer: Medicare Other | Source: Ambulatory Visit | Attending: Orthopedic Surgery | Admitting: Orthopedic Surgery

## 2017-02-12 DIAGNOSIS — M25552 Pain in left hip: Secondary | ICD-10-CM | POA: Diagnosis not present

## 2017-02-12 DIAGNOSIS — N3941 Urge incontinence: Secondary | ICD-10-CM | POA: Diagnosis not present

## 2017-02-12 DIAGNOSIS — G8929 Other chronic pain: Secondary | ICD-10-CM

## 2017-02-17 DIAGNOSIS — M25552 Pain in left hip: Secondary | ICD-10-CM | POA: Diagnosis not present

## 2017-02-17 DIAGNOSIS — M7072 Other bursitis of hip, left hip: Secondary | ICD-10-CM | POA: Diagnosis not present

## 2017-02-19 DIAGNOSIS — M25552 Pain in left hip: Secondary | ICD-10-CM | POA: Diagnosis not present

## 2017-02-19 DIAGNOSIS — G8929 Other chronic pain: Secondary | ICD-10-CM | POA: Diagnosis not present

## 2017-02-19 DIAGNOSIS — S72092D Other fracture of head and neck of left femur, subsequent encounter for closed fracture with routine healing: Secondary | ICD-10-CM | POA: Diagnosis not present

## 2017-02-19 DIAGNOSIS — M7062 Trochanteric bursitis, left hip: Secondary | ICD-10-CM | POA: Diagnosis not present

## 2017-02-20 DIAGNOSIS — M25552 Pain in left hip: Secondary | ICD-10-CM | POA: Diagnosis not present

## 2017-02-20 DIAGNOSIS — M7072 Other bursitis of hip, left hip: Secondary | ICD-10-CM | POA: Diagnosis not present

## 2017-02-23 DIAGNOSIS — M7072 Other bursitis of hip, left hip: Secondary | ICD-10-CM | POA: Diagnosis not present

## 2017-02-23 DIAGNOSIS — M25552 Pain in left hip: Secondary | ICD-10-CM | POA: Diagnosis not present

## 2017-02-24 ENCOUNTER — Ambulatory Visit (INDEPENDENT_AMBULATORY_CARE_PROVIDER_SITE_OTHER): Payer: Medicare Other

## 2017-02-24 DIAGNOSIS — I639 Cerebral infarction, unspecified: Secondary | ICD-10-CM | POA: Diagnosis not present

## 2017-02-24 LAB — POCT INR: INR: 2.5

## 2017-02-24 NOTE — Patient Instructions (Signed)
Pre visit review using our clinic review tool, if applicable. No additional management support is needed unless otherwise documented below in the visit note. 

## 2017-02-25 ENCOUNTER — Other Ambulatory Visit: Payer: Self-pay | Admitting: Family Medicine

## 2017-02-25 DIAGNOSIS — D539 Nutritional anemia, unspecified: Secondary | ICD-10-CM

## 2017-02-25 DIAGNOSIS — N183 Chronic kidney disease, stage 3 unspecified: Secondary | ICD-10-CM

## 2017-02-25 DIAGNOSIS — E785 Hyperlipidemia, unspecified: Secondary | ICD-10-CM

## 2017-02-25 DIAGNOSIS — D696 Thrombocytopenia, unspecified: Secondary | ICD-10-CM

## 2017-02-26 ENCOUNTER — Ambulatory Visit (INDEPENDENT_AMBULATORY_CARE_PROVIDER_SITE_OTHER): Payer: Medicare Other

## 2017-02-26 VITALS — BP 86/60 | HR 79 | Temp 98.5°F | Ht 64.0 in | Wt 169.0 lb

## 2017-02-26 DIAGNOSIS — Z0000 Encounter for general adult medical examination without abnormal findings: Secondary | ICD-10-CM | POA: Diagnosis not present

## 2017-02-26 DIAGNOSIS — E785 Hyperlipidemia, unspecified: Secondary | ICD-10-CM | POA: Diagnosis not present

## 2017-02-26 DIAGNOSIS — D539 Nutritional anemia, unspecified: Secondary | ICD-10-CM | POA: Diagnosis not present

## 2017-02-26 DIAGNOSIS — N183 Chronic kidney disease, stage 3 unspecified: Secondary | ICD-10-CM

## 2017-02-26 LAB — MICROALBUMIN / CREATININE URINE RATIO
Creatinine,U: 149.8 mg/dL
Microalb Creat Ratio: 3.6 mg/g (ref 0.0–30.0)
Microalb, Ur: 5.4 mg/dL — ABNORMAL HIGH (ref 0.0–1.9)

## 2017-02-26 LAB — COMPREHENSIVE METABOLIC PANEL WITH GFR
ALT: 33 U/L (ref 0–53)
AST: 35 U/L (ref 0–37)
Albumin: 4.1 g/dL (ref 3.5–5.2)
Alkaline Phosphatase: 35 U/L — ABNORMAL LOW (ref 39–117)
BUN: 22 mg/dL (ref 6–23)
CO2: 27 meq/L (ref 19–32)
Calcium: 9.5 mg/dL (ref 8.4–10.5)
Chloride: 105 meq/L (ref 96–112)
Creatinine, Ser: 1.56 mg/dL — ABNORMAL HIGH (ref 0.40–1.50)
GFR: 45.33 mL/min — ABNORMAL LOW
Glucose, Bld: 119 mg/dL — ABNORMAL HIGH (ref 70–99)
Potassium: 4.3 meq/L (ref 3.5–5.1)
Sodium: 140 meq/L (ref 135–145)
Total Bilirubin: 1.1 mg/dL (ref 0.2–1.2)
Total Protein: 6.6 g/dL (ref 6.0–8.3)

## 2017-02-26 LAB — CBC WITH DIFFERENTIAL/PLATELET
BASOS ABS: 0.1 10*3/uL (ref 0.0–0.1)
Basophils Relative: 1.2 % (ref 0.0–3.0)
EOS ABS: 0.1 10*3/uL (ref 0.0–0.7)
Eosinophils Relative: 1.4 % (ref 0.0–5.0)
HCT: 33.9 % — ABNORMAL LOW (ref 39.0–52.0)
HEMOGLOBIN: 11.8 g/dL — AB (ref 13.0–17.0)
Lymphocytes Relative: 23.4 % (ref 12.0–46.0)
Lymphs Abs: 2.1 10*3/uL (ref 0.7–4.0)
MCHC: 34.9 g/dL (ref 30.0–36.0)
MCV: 115.2 fl — ABNORMAL HIGH (ref 78.0–100.0)
MONO ABS: 0.6 10*3/uL (ref 0.1–1.0)
Monocytes Relative: 6.2 % (ref 3.0–12.0)
NEUTROS PCT: 67.8 % (ref 43.0–77.0)
Neutro Abs: 6.1 10*3/uL (ref 1.4–7.7)
Platelets: 190 10*3/uL (ref 150.0–400.0)
RBC: 2.94 Mil/uL — ABNORMAL LOW (ref 4.22–5.81)
RDW: 15.4 % (ref 11.5–15.5)
WBC: 8.9 10*3/uL (ref 4.0–10.5)

## 2017-02-26 LAB — LDL CHOLESTEROL, DIRECT: Direct LDL: 89 mg/dL

## 2017-02-26 LAB — LIPID PANEL
CHOLESTEROL: 165 mg/dL (ref 0–200)
HDL: 22.2 mg/dL — ABNORMAL LOW (ref >39.00)
NonHDL: 142.7
Total CHOL/HDL Ratio: 7
Triglycerides: 353 mg/dL — ABNORMAL HIGH (ref 0.0–149.0)
VLDL: 70.6 mg/dL — AB (ref 0.0–40.0)

## 2017-02-26 LAB — VITAMIN D 25 HYDROXY (VIT D DEFICIENCY, FRACTURES): VITD: 51.74 ng/mL (ref 30.00–100.00)

## 2017-02-26 LAB — TSH: TSH: 2.85 u[IU]/mL (ref 0.35–4.50)

## 2017-02-26 NOTE — Progress Notes (Signed)
Pre visit review using our clinic review tool, if applicable. No additional management support is needed unless otherwise documented below in the visit note. 

## 2017-02-26 NOTE — Patient Instructions (Signed)
[name] , Thank you for taking time to come for your Medicare Wellness Visit. I appreciate your ongoing commitment to your health goals. Please review the following plan we discussed and let me know if I can assist you in the future.   These are the goals we discussed: Goals    . patient safety          Starting 02/26/2017, I will continue use walker at all times to reduce risk of falls.        This is a list of the screening recommended for you and due dates:  Health Maintenance  Topic Date Due  . Flu Shot  04/08/2017  . Colon Cancer Screening  04/12/2017  . DTaP/Tdap/Td vaccine (2 - Td) 09/09/2019  . Tetanus Vaccine  10/26/2020  . Pneumonia vaccines  Completed   Preventive Care for Adults  A healthy lifestyle and preventive care can promote health and wellness. Preventive health guidelines for adults include the following key practices.  . A routine yearly physical is a good way to check with your health care provider about your health and preventive screening. It is a chance to share any concerns and updates on your health and to receive a thorough exam.  . Visit your dentist for a routine exam and preventive care every 6 months. Brush your teeth twice a day and floss once a day. Good oral hygiene prevents tooth decay and gum disease.  . The frequency of eye exams is based on your age, health, family medical history, use  of contact lenses, and other factors. Follow your health care provider's ecommendations for frequency of eye exams.  . Eat a healthy diet. Foods like vegetables, fruits, whole grains, low-fat dairy products, and lean protein foods contain the nutrients you need without too many calories. Decrease your intake of foods high in solid fats, added sugars, and salt. Eat the right amount of calories for you. Get information about a proper diet from your health care provider, if necessary.  . Regular physical exercise is one of the most important things you can do  for your health. Most adults should get at least 150 minutes of moderate-intensity exercise (any activity that increases your heart rate and causes you to sweat) each week. In addition, most adults need muscle-strengthening exercises on 2 or more days a week.  Silver Sneakers may be a benefit available to you. To determine eligibility, you may visit the website: www.silversneakers.com or contact program at 807-461-4555 Mon-Fri between 8AM-8PM.   . Maintain a healthy weight. The body mass index (BMI) is a screening tool to identify possible weight problems. It provides an estimate of body fat based on height and weight. Your health care provider can find your BMI and can help you achieve or maintain a healthy weight.   For adults 20 years and older: ? A BMI below 18.5 is considered underweight. ? A BMI of 18.5 to 24.9 is normal. ? A BMI of 25 to 29.9 is considered overweight. ? A BMI of 30 and above is considered obese.   . Maintain normal blood lipids and cholesterol levels by exercising and minimizing your intake of saturated fat. Eat a balanced diet with plenty of fruit and vegetables. Blood tests for lipids and cholesterol should begin at age 89 and be repeated every 5 years. If your lipid or cholesterol levels are high, you are over 50, or you are at high risk for heart disease, you may need your cholesterol levels checked more  frequently. Ongoing high lipid and cholesterol levels should be treated with medicines if diet and exercise are not working.  . If you smoke, find out from your health care provider how to quit. If you do not use tobacco, please do not start.  . If you choose to drink alcohol, please do not consume more than 2 drinks per day. One drink is considered to be 12 ounces (355 mL) of beer, 5 ounces (148 mL) of wine, or 1.5 ounces (44 mL) of liquor.  . If you are 24-76 years old, ask your health care provider if you should take aspirin to prevent strokes.  . Use sunscreen.  Apply sunscreen liberally and repeatedly throughout the day. You should seek shade when your shadow is shorter than you. Protect yourself by wearing long sleeves, pants, a wide-brimmed hat, and sunglasses year round, whenever you are outdoors.  . Once a month, do a whole body skin exam, using a mirror to look at the skin on your back. Tell your health care provider of new moles, moles that have irregular borders, moles that are larger than a pencil eraser, or moles that have changed in shape or color.

## 2017-02-26 NOTE — Progress Notes (Signed)
PCP notes:   Health maintenance:  No gaps identified.   Abnormal screenings:   Hearing - failed Fall risk - hx of multiple falls with injury and medical treatment Depression score: 11  Patient concerns:   None  Nurse concerns:  None  Next PCP appt:   03/03/17 @ 1130

## 2017-02-26 NOTE — Progress Notes (Signed)
Subjective:   [name] is a 83 y.o. male who presents for Medicare Annual (Subsequent) preventive examination.  Review of Systems: N/A Cardiac Risk Factors include: advanced age (>57men, >41 women);male gender     Objective:     Vitals: BP (!) 86/60 (BP Location: Right Arm, Patient Position: Sitting, Cuff Size: Normal)   Pulse 79   Temp 98.5 F (36.9 C) (Oral)   Ht 5\' 4"  (1.626 m) Comment: shoes  Wt 169 lb (76.7 kg)   SpO2 97%   BMI 29.01 kg/m   Body mass index is 29.01 kg/m.   Tobacco History  Smoking Status  . Never Smoker  Smokeless Tobacco  . Never Used     Counseling given: No   Past Medical History:  Diagnosis Date  . Allergic rhinitis   . Arthritis   . Asthma    remote  . Blood transfusion 1990's  . CAD (coronary artery disease)    cath 2000 30% single vessel, normal nuclear stress test 12/03/2010, no evidence ischemia  . Candidal urethritis in male 06/15/2015  . CKD (chronic kidney disease) stage 3, GFR 30-59 ml/min    baseline Cr 1.7  . Closed C7 fracture ([place]) 11/06/2014  . DDD (degenerative disc disease) 03/2013   by CT, diffuse multilevel cervical and lumbar spondylosis  . Depression   . DISH (diffuse idiopathic skeletal hyperostosis) 03/2013   lumbar spine on xray  . Dizziness    multifactorial, s/p PT at [facility] 06/2014 with HEP  . DVT (deep venous thrombosis) ([place]) 07/2016  . Elevated PSA    previous-normalized (followed by Dr. Jeffie Pollock, rec no repeat unless urinary sxs)  . Essential tremor 09/27/2008   reviewed eval by Dr Jannifer Franklin in chart 2011   . GERD (gastroesophageal reflux disease)    h/o PUD  . Hearing loss 06/2012   eval - rec annual exam  . History of fracture of right hip   . History of phlebitis   . History of pyelonephritis 05/2014   hospitalization with sepsis  . HLD (hyperlipidemia)    hypertriglyceridemia  . Hx pulmonary embolism 08/1991   negative hypercoagulable panel 12/2014  . Inguinal hernia   . Macrocytic anemia  2014   stable B12/folate and periph smear 05/2013, again periph smear 2017 with mature cells, mild dacrocytosis  . Osteoporosis 11/2013   DEXA T score -2.7  . Prediabetes   . Schatzki's ring 02/2012   s/p dilation [name]  . Sleep apnea    no CPAP- sleep apnea "cleared up 15 years ago"  . Stroke ([place])   . Urge incontinence   . UTI (urinary tract infection) due to Enterococcus 05/13/2015   Past Surgical History:  Procedure Laterality Date  . APPENDECTOMY  1996  . CARDIAC CATHETERIZATION  2000   30% one vessel  . CARDIOVASCULAR STRESS TEST  03/2015   no ischemia, low risk, EF 76%  . CAROTID U/S  12/28/2007   nml  . CATARACT EXTRACTION  09/2009   bilateral  . COLONOSCOPY  1998   [place]  . COLONOSCOPY  07/2010   5 polyps, adenomatous, rec rpt 3 yrs  . COLONOSCOPY  04/2014   3 polyps, adenomatous, f/u open ended given age [name]  . CT ABD W & PELVIS WO CM  07/2001   Scarring of right lung, stable negative o/w  . CT ABD W & PELVIS WO CM  11/2000   ? stones, right LL scarring  . CYSTOSCOPY  1992   for  kidney stones  . EEG  11/12/2009   nml  . ESOPHAGOGASTRODUODENOSCOPY  02/2012   dilation of schatzki's ring [name]  . ESOPHAGOGASTRODUODENOSCOPY N/A 11/15/2015   Procedure: ESOPHAGOGASTRODUODENOSCOPY (EGD);  Surgeon: Gatha Mayer, MD;  Location: [facility] ENDOSCOPY;  Service: Endoscopy;  Laterality: N/A;  . ESOPHAGOGASTRODUODENOSCOPY  11/2016   esophageal erosions, GERD, esophageal stenosis dilated [name]  . EXPLORATORY LAPAROTOMY  1996   with incidental appendectomy  . HERNIA REPAIR  1979   Left  . HIP PINNING,CANNULATED Left 03/11/2016   Procedure: CANNULATED HIP PINNING;  Surgeon: Rod Can, MD;  Location: [facility];  Service: Orthopedics;  Laterality: Left;  . KNEE ARTHROSCOPY  03/24/02   Right (Dr. Mauri Pole)  . MRI  11/2009   Head, nml  . ORIF FEMORAL NECK FRACTURE W/ DHS Right 08/03/2002   Dr Mauri Pole  . TONSILLECTOMY  1965  . US ECHOCARDIOGRAPHY  12/28/2007   Mild aortic  valve calcification EF 55%, basically nml  . V/Q SCAN  06/1999   negative   Family History  Problem Relation Age of Onset  . Stroke Mother   . Hypertension Mother   . Cancer Brother        prostate with mets  . Blindness Brother        legally  . Diabetes Brother   . Pulmonary embolism Sister        from shoulder operation  . Alcohol abuse Brother   . Colon cancer Neg Hx   . Esophageal cancer Neg Hx   . Rectal cancer Neg Hx   . Stomach cancer Neg Hx    History  Sexual Activity  . Sexual activity: Not Currently    Outpatient Encounter Prescriptions as of 02/26/2017  Medication Sig  . albuterol (PROVENTIL HFA;VENTOLIN HFA) 108 (90 Base) MCG/ACT inhaler Inhale 2 puffs into the lungs every 6 (six) hours as needed for wheezing or shortness of breath.  Marland Kitchen b complex vitamins tablet Take 1 tablet by mouth daily.  . Biotin 2500 MCG CAPS Take 1 capsule by mouth daily.  . Cholecalciferol (VITAMIN D) 2000 UNITS CAPS Take 1 capsule by mouth daily. Reported on 01/30/2016  . clotrimazole (LOTRIMIN) 1 % cream Apply 1 application topically 2 (two) times daily. (Patient taking differently: Apply 1 application topically 2 (two) times daily as needed. For rash)  . Coenzyme Q10 (COQ-10) 100 MG CAPS Take 1 capsule by mouth daily.   . Cyanocobalamin (B-12 PO) Take 10,000 mcg by mouth every Monday, Wednesday, and Friday.   Marland Kitchen DEXILANT 60 MG capsule TAKE 1 CAPSULE DAILY  . docusate sodium (COLACE) 100 MG capsule Take 100 mg by mouth at bedtime.   . fenofibrate (TRICOR) 145 MG tablet Take 1 tablet (145 mg total) by mouth daily.  . fexofenadine (ALLEGRA) 180 MG tablet Take 180 mg by mouth daily.   . Multiple Vitamins-Minerals (MULTIVITAMIN PO) Take 1 tablet by mouth daily.   . simvastatin (ZOCOR) 40 MG tablet Take 1 tablet (40 mg total) by mouth daily.  Marland Kitchen venlafaxine (EFFEXOR) 75 MG tablet TAKE ONE TABLET TWICE DAILY WITH MEALS  . warfarin (COUMADIN) 2.5 MG tablet Take 1 tablet (2.5 mg total) by mouth  daily. As directed  . [DISCONTINUED] dexlansoprazole (DEXILANT) 60 MG capsule Take 1 capsule (60 mg total) by mouth daily.  . [DISCONTINUED] enoxaparin (LOVENOX) 80 MG/0.8ML injection Inject 0.8 mLs (80 mg total) into the skin daily.   Facility-Administered Encounter Medications as of 02/26/2017  Medication  . 0.9 %  sodium chloride infusion  Activities of Daily Living In your present state of health, do you have any difficulty performing the following activities: 02/26/2017 03/10/2016  Hearing? [place]? N -  Difficulty concentrating or making decisions? Y -  Walking or climbing stairs? Y -  Dressing or bathing? Y -  Doing errands, shopping? [place] Donning  Preparing Food and eating ? Y -  Using the Toilet? N -  In the past six months, have you accidently leaked urine? Y -  Do you have problems with loss of bowel control? Y -  Managing your Medications? Y -  Managing your Finances? Y -  Housekeeping or managing your Housekeeping? Y -  Some recent data might be hidden    Patient Care Team: Ria Bush, MD as PCP - General Birder Robson, MD as Referring Physician (Ophthalmology)    Assessment:     Hearing Screening   125Hz  250Hz  500Hz  1000Hz  2000Hz  3000Hz  4000Hz  6000Hz  8000Hz   Right ear:   40 40 40  0    Left ear:   40 40 40  0    Vision Screening Comments: Last vision exam approx. 6 mths ago with Dr. George Ina   Exercise Activities and Dietary recommendations Current Exercise Habits: The patient does not participate in regular exercise at present, Exercise limited by: None identified  Goals    . patient safety          Starting 02/26/2017, I will continue use walker at all times to reduce risk of falls.       Fall Risk Fall Risk  02/26/2017 01/30/2016 12/18/2015 11/06/2014 06/08/2013  Falls in the past year? Yes Yes Yes Yes Yes  Number falls in past yr: 2 or more 2 or more 2 or more 1 1  Injury with Fall? Yes No No No Yes  Risk Factor Category  - - - - High Fall Risk    Risk for fall due to : - - - Impaired balance/gait Impaired balance/gait;Other (Comment)   Depression Screen PHQ 2/9 Scores 02/26/2017 12/18/2015 12/18/2015 11/21/2015  PHQ - 2 Score 3 3 2  0  PHQ- 9 Score 11 15 - -     Cognitive Function MMSE - Mini Mental State Exam 02/26/2017 09/15/2016 05/01/2016 09/21/2015  Not completed: Unable to complete - - -  Orientation to time - 4 2 4   Orientation to Place - 5 5 4   Registration - 3 3 2   Attention/ Calculation - 3 3 5   Recall - 3 2 2   Language- name 2 objects - 2 2 2   Language- repeat - 1 1 1   Language- follow 3 step command - 3 3 3   Language- read & follow direction - 1 1 1   Write a sentence - 1 1 0  Copy design - 1 1 0  Total score - 27 24 24       dx of unspecified dementia  Immunization History  Administered Date(s) Administered  . Influenza Split 06/09/2011, 06/07/2012  . Influenza Whole 07/02/2006, 07/16/2007, 06/20/2009, 05/30/2010  . Influenza,inj,Quad PF,36+ Mos 05/18/2014, 07/10/2015, 05/16/2016  . Influenza-Unspecified 06/08/2013  . Pneumococcal Conjugate-13 11/14/2013  . Pneumococcal Polysaccharide-23 09/08/1998  . Td 03/02/2002  . Tdap 09/08/2009  . Zoster 08/22/2005   Screening Tests Health Maintenance  Topic Date Due  . INFLUENZA VACCINE  04/08/2017  . COLONOSCOPY  04/12/2017  . DTaP/Tdap/Td (2 - Td) 09/09/2019  . TETANUS/TDAP  10/26/2020  . PNA vac Low Risk Adult  Completed      Plan:  I have personally reviewed and addressed the Medicare Annual Wellness questionnaire and have noted the following in the patient's chart:  A. Medical and social history B. Use of alcohol, tobacco or illicit drugs  C. Current medications and supplements D. Functional ability and status E.  Nutritional status F.  Physical activity G. Advance directives H. List of other physicians I.  Hospitalizations, surgeries, and ER visits in previous 12 months J.  [place] to include hearing, vision, cognitive,  depression L. Referrals and appointments - none  In addition, I have reviewed and discussed with patient certain preventive protocols, quality metrics, and best practice recommendations. A written personalized care plan for preventive services as well as general preventive health recommendations were provided to patient.  See attached scanned questionnaire for additional information.   Signed,   Lindell Noe, MHA, BS, LPN Health Coach

## 2017-02-27 DIAGNOSIS — M25552 Pain in left hip: Secondary | ICD-10-CM | POA: Diagnosis not present

## 2017-02-27 DIAGNOSIS — M7072 Other bursitis of hip, left hip: Secondary | ICD-10-CM | POA: Diagnosis not present

## 2017-02-27 LAB — PATHOLOGIST SMEAR REVIEW

## 2017-02-27 NOTE — Progress Notes (Signed)
I reviewed health advisor's note, was available for consultation, and agree with documentation and plan.  

## 2017-03-02 ENCOUNTER — Emergency Department (HOSPITAL_COMMUNITY)
Admission: EM | Admit: 2017-03-02 | Discharge: 2017-03-02 | Disposition: A | Discharge status: Home / Self Care | Payer: Medicare Other | Attending: Emergency Medicine | Admitting: Emergency Medicine

## 2017-03-02 ENCOUNTER — Encounter (HOSPITAL_COMMUNITY): Payer: Self-pay | Admitting: Nurse Practitioner

## 2017-03-02 ENCOUNTER — Emergency Department (HOSPITAL_COMMUNITY): Payer: Medicare Other

## 2017-03-02 DIAGNOSIS — R410 Disorientation, unspecified: Secondary | ICD-10-CM | POA: Diagnosis not present

## 2017-03-02 DIAGNOSIS — Z8673 Personal history of transient ischemic attack (TIA), and cerebral infarction without residual deficits: Secondary | ICD-10-CM | POA: Insufficient documentation

## 2017-03-02 DIAGNOSIS — I509 Heart failure, unspecified: Secondary | ICD-10-CM | POA: Diagnosis not present

## 2017-03-02 DIAGNOSIS — R42 Dizziness and giddiness: Secondary | ICD-10-CM | POA: Diagnosis not present

## 2017-03-02 DIAGNOSIS — N183 Chronic kidney disease, stage 3 (moderate): Secondary | ICD-10-CM | POA: Insufficient documentation

## 2017-03-02 DIAGNOSIS — I639 Cerebral infarction, unspecified: Secondary | ICD-10-CM | POA: Diagnosis not present

## 2017-03-02 DIAGNOSIS — Z79899 Other long term (current) drug therapy: Secondary | ICD-10-CM | POA: Insufficient documentation

## 2017-03-02 DIAGNOSIS — I2510 Atherosclerotic heart disease of native coronary artery without angina pectoris: Secondary | ICD-10-CM | POA: Insufficient documentation

## 2017-03-02 DIAGNOSIS — J45909 Unspecified asthma, uncomplicated: Secondary | ICD-10-CM | POA: Insufficient documentation

## 2017-03-02 DIAGNOSIS — M25552 Pain in left hip: Secondary | ICD-10-CM | POA: Diagnosis not present

## 2017-03-02 DIAGNOSIS — Z7901 Long term (current) use of anticoagulants: Secondary | ICD-10-CM | POA: Insufficient documentation

## 2017-03-02 DIAGNOSIS — M7072 Other bursitis of hip, left hip: Secondary | ICD-10-CM | POA: Diagnosis not present

## 2017-03-02 LAB — I-STAT CHEM 8, ED
BUN: 25 mg/dL — AB (ref 6–20)
CALCIUM ION: 1.19 mmol/L (ref 1.15–1.40)
CHLORIDE: 107 mmol/L (ref 101–111)
Creatinine, Ser: 1.4 mg/dL — ABNORMAL HIGH (ref 0.61–1.24)
Glucose, Bld: 154 mg/dL — ABNORMAL HIGH (ref 65–99)
HCT: 32 % — ABNORMAL LOW (ref 39.0–52.0)
Hemoglobin: 10.9 g/dL — ABNORMAL LOW (ref 13.0–17.0)
Potassium: 4.1 mmol/L (ref 3.5–5.1)
SODIUM: 144 mmol/L (ref 135–145)
TCO2: 26 mmol/L (ref 0–100)

## 2017-03-02 LAB — DIFFERENTIAL
Basophils Absolute: 0.1 10*3/uL (ref 0.0–0.1)
Basophils Relative: 1 %
Eosinophils Absolute: 0.1 10*3/uL (ref 0.0–0.7)
Eosinophils Relative: 2 %
LYMPHS PCT: 25 %
Lymphs Abs: 1.6 10*3/uL (ref 0.7–4.0)
Monocytes Absolute: 0.4 10*3/uL (ref 0.1–1.0)
Monocytes Relative: 6 %
Neutro Abs: 4 10*3/uL (ref 1.7–7.7)
Neutrophils Relative %: 66 %

## 2017-03-02 LAB — URINALYSIS, ROUTINE W REFLEX MICROSCOPIC
BILIRUBIN URINE: NEGATIVE
Glucose, UA: NEGATIVE mg/dL
Hgb urine dipstick: NEGATIVE
Ketones, ur: NEGATIVE mg/dL
Leukocytes, UA: NEGATIVE
NITRITE: NEGATIVE
Protein, ur: NEGATIVE mg/dL
SPECIFIC GRAVITY, URINE: 1.016 (ref 1.005–1.030)
pH: 6 (ref 5.0–8.0)

## 2017-03-02 LAB — COMPREHENSIVE METABOLIC PANEL
ALBUMIN: 3.7 g/dL (ref 3.5–5.0)
ALK PHOS: 37 U/L — AB (ref 38–126)
ALT: 37 U/L (ref 17–63)
AST: 44 U/L — AB (ref 15–41)
Anion gap: 12 (ref 5–15)
BUN: 21 mg/dL — AB (ref 6–20)
CALCIUM: 9.1 mg/dL (ref 8.9–10.3)
CO2: 21 mmol/L — AB (ref 22–32)
Chloride: 108 mmol/L (ref 101–111)
Creatinine, Ser: 1.49 mg/dL — ABNORMAL HIGH (ref 0.61–1.24)
GFR calc Af Amer: 48 mL/min — ABNORMAL LOW (ref >60)
GFR calc non Af Amer: 42 mL/min — ABNORMAL LOW (ref >60)
GLUCOSE: 151 mg/dL — AB (ref 65–99)
Potassium: 4.1 mmol/L (ref 3.5–5.1)
Sodium: 141 mmol/L (ref 135–145)
TOTAL PROTEIN: 6.1 g/dL — AB (ref 6.5–8.1)
Total Bilirubin: 1 mg/dL (ref 0.3–1.2)

## 2017-03-02 LAB — CBC
HEMATOCRIT: 33.8 % — AB (ref 39.0–52.0)
HEMOGLOBIN: 11.1 g/dL — AB (ref 13.0–17.0)
MCH: 37.9 pg — ABNORMAL HIGH (ref 26.0–34.0)
MCHC: 32.8 g/dL (ref 30.0–36.0)
MCV: 115.4 fL — AB (ref 78.0–100.0)
Platelets: 152 10*3/uL (ref 150–400)
RBC: 2.93 MIL/uL — AB (ref 4.22–5.81)
RDW: 14.7 % (ref 11.5–15.5)
WBC: 6.2 10*3/uL (ref 4.0–10.5)

## 2017-03-02 LAB — I-STAT TROPONIN, ED: Troponin i, poc: 0 ng/mL (ref 0.00–0.08)

## 2017-03-02 LAB — PROTIME-INR
INR: 2.62
PROTHROMBIN TIME: 28.5 s — AB (ref 11.4–15.2)

## 2017-03-02 LAB — CBG MONITORING, ED: Glucose-Capillary: 165 mg/dL — ABNORMAL HIGH (ref 65–99)

## 2017-03-02 NOTE — Discharge Instructions (Signed)
As discussed, your evaluation today has been largely reassuring.  But, it is important that you monitor your condition carefully, and do not hesitate to return to the ED if you develop new, or concerning changes in your condition. ? ?Otherwise, please follow-up with your physician for appropriate ongoing care. ? ?

## 2017-03-02 NOTE — ED Notes (Signed)
XR at bedside

## 2017-03-02 NOTE — ED Notes (Signed)
Patient transported to CT 

## 2017-03-02 NOTE — ED Provider Notes (Signed)
Patient awake and alert, states that he feels better, and his wife indicates that he looks good. We discussed the MRI results.  He will f/u w neuro and PMD.   Carmin Muskrat, MD 03/02/17 [phone]

## 2017-03-02 NOTE — ED Notes (Signed)
Pt transported to MRI 

## 2017-03-02 NOTE — ED Provider Notes (Signed)
[facility] DEPT Provider Note   CSN: [id number] Arrival date & time: 03/02/17  1246     History   Chief Complaint Chief Complaint  Patient presents with  . Altered Mental Status    HPI [name] is a 83 y.o. male history of CAD, CK D, previous stroke and DVT on Coumadin, here presenting with dizziness, weakness. Patient is demented at baseline and is weak at baseline. He did have a hip fracture several months ago and had a pin and is still in rehabilitation. He woke up around 7 AM and is at his baseline. Patient went to physical therapy this morning and around 11 AM, he was stood up from the physical therapy table and felt very dizzy. Apparently he had trouble following commands at that time and had diffuse weakness. They then brought him to the parallel bar and he was unable to walk very well. Wife then brought him to the ED. She states that he is on coumadin and most recent INR was 2.5 a week ago. Patient had a stroke a year ago but had slurred speech at that time.    The history is provided by the patient.    Past Medical History:  Diagnosis Date  . Allergic rhinitis   . Arthritis   . Asthma    remote  . Blood transfusion 1990's  . CAD (coronary artery disease)    cath 2000 30% single vessel, normal nuclear stress test 12/03/2010, no evidence ischemia  . Candidal urethritis in male 06/15/2015  . CKD (chronic kidney disease) stage 3, GFR 30-59 ml/min    baseline Cr 1.7  . Closed C7 fracture ([place]) 11/06/2014  . DDD (degenerative disc disease) 03/2013   by CT, diffuse multilevel cervical and lumbar spondylosis  . Depression   . DISH (diffuse idiopathic skeletal hyperostosis) 03/2013   lumbar spine on xray  . Dizziness    multifactorial, s/p PT at [facility] 06/2014 with HEP  . DVT (deep venous thrombosis) ([place]) 07/2016  . Elevated PSA    previous-normalized (followed by Dr. Jeffie Pollock, rec no repeat unless urinary sxs)  . Essential tremor 09/27/2008   reviewed eval by Dr  Jannifer Franklin in chart 2011   . GERD (gastroesophageal reflux disease)    h/o PUD  . Hearing loss 06/2012   eval - rec annual exam  . History of fracture of right hip   . History of phlebitis   . History of pyelonephritis 05/2014   hospitalization with sepsis  . HLD (hyperlipidemia)    hypertriglyceridemia  . Hx pulmonary embolism 08/1991   negative hypercoagulable panel 12/2014  . Inguinal hernia   . Macrocytic anemia 2014   stable B12/folate and periph smear 05/2013, again periph smear 2017 with mature cells, mild dacrocytosis  . Osteoporosis 11/2013   DEXA T score -2.7  . Prediabetes   . Schatzki's ring 02/2012   s/p dilation [name]  . Sleep apnea    no CPAP- sleep apnea "cleared up 15 years ago"  . Stroke ([place])   . Urge incontinence   . UTI (urinary tract infection) due to Enterococcus 05/13/2015    Patient Active Problem List   Diagnosis Date Noted  . Abdominal pain, right lower quadrant 09/11/2016  . Acute deep vein thrombosis (DVT) of left tibial vein ([place]) 08/05/2016  . Encounter for current long-term use of anticoagulants 04/20/2016  . Left displaced femoral neck fracture ([place]) 03/11/2016  . Dysuria 02/07/2016  . Acute ischemic stroke ([place]) 11/16/2015  . Elevated factor  VIII level 11/16/2015  . Esophageal stenosis   . CHF (congestive heart failure) ([place]) 11/12/2015  . Recurrent UTI 05/13/2015  . Drug rash 04/09/2015  . Chest pain at rest 03/29/2015  . Pedal edema 02/07/2015  . CN (constipation) 01/15/2015  . Orthostatic hypotension 11/24/2014  . Syncope 11/07/2014  . Advanced care planning/counseling discussion 11/06/2014  . Daytime somnolence 11/06/2014  . Macrocytic anemia   . History of colonic polyps 03/27/2014  . Trigger finger, acquired 01/05/2014  . Osteoporosis 11/06/2013  . Dementia 06/08/2013  . Vertigo 01/28/2013  . Bilateral hearing loss 06/07/2012  . GERD (gastroesophageal reflux disease) 04/30/2012  . Dysphagia 12/30/2011  . HLD (hyperlipidemia)  12/08/2011  . MDD (major depressive disorder), recurrent episode, moderate ([place])   . Medicare annual wellness visit, subsequent 06/09/2011  . UNSTEADY GAIT 04/16/2010  . Vitamin B12 deficiency 11/01/2008  . Vitamin D deficiency 11/01/2008  . Essential tremor 09/27/2008  . HSV 03/08/2007  . Thrombocytopenia ([place]) 03/08/2007  . Coronary atherosclerosis 03/08/2007  . CKD (chronic kidney disease) stage 3, GFR 30-59 ml/min 03/08/2007  . Urge incontinence 03/08/2007  . RENAL CALCULUS, HX OF 03/08/2007  . Phlebitis and thrombophlebitis 01/04/2007  . Personal history of pulmonary embolism 09/08/1990    Past Surgical History:  Procedure Laterality Date  . APPENDECTOMY  1996  . CARDIAC CATHETERIZATION  2000   30% one vessel  . CARDIOVASCULAR STRESS TEST  03/2015   no ischemia, low risk, EF 76%  . CAROTID U/S  12/28/2007   nml  . CATARACT EXTRACTION  09/2009   bilateral  . COLONOSCOPY  1998   [place] w[place]  . COLONOSCOPY  07/2010   5 polyps, adenomatous, rec rpt 3 yrs  . COLONOSCOPY  04/2014   3 polyps, adenomatous, f/u open ended given age [name]  . CT ABD W & PELVIS WO CM  07/2001   Scarring of right lung, stable negative o/w  . CT ABD W & PELVIS WO CM  11/2000   ? stones, right LL scarring  . CYSTOSCOPY  1992   for kidney stones  . EEG  11/12/2009   nml  . ESOPHAGOGASTRODUODENOSCOPY  02/2012   dilation of schatzki's ring [name]  . ESOPHAGOGASTRODUODENOSCOPY N/A 11/15/2015   Procedure: ESOPHAGOGASTRODUODENOSCOPY (EGD);  Surgeon: Gatha Mayer, MD;  Location: [facility] END[facility]COPY;  Service: Endoscopy;  Laterality: N/A;  . ESOPHAGOGASTRODUODENOSCOPY  11/2016   esophageal erosions, GERD, esophageal stenosis dilated [name]  . EXPLORATORY LAPAROTOMY  1996   with incidental appendectomy  . HERNIA REPAIR  1979   Left  . HIP PINNING,CANNULATED Left 03/11/2016   Procedure: CANNULATED HIP PINNING;  Surgeon: Rod Can, MD;  Location: [facility];  Service: Orthopedics;  Laterality: Left;  . KNEE  ARTHROSCOPY  03/24/02   Right (Dr. Mauri Pole)  . MRI  11/2009   Head, nml  . ORIF FEMORAL NECK FRACTURE W/ DHS Right 08/03/2002   Dr Mauri Pole  . TONSILLECTOMY  1965  . US ECHOCARDIOGRAPHY  12/28/2007   Mild aortic valve calcification EF 55%, basically nml  . V/Q SCAN  06/1999   negative       Home Medications    Prior to Admission medications   Medication Sig Start Date End Date Taking? Authorizing Provider  acetaminophen (TYLENOL) 325 MG tablet Take 325-650 mg by mouth every 6 (six) hours as needed (for hip pain).    Yes [provider]  albuterol (PROVENTIL HFA;VENTOLIN HFA) 108 (90 Base) MCG/ACT inhaler Inhale 2 puffs into the lungs every 6 (six)  hours as needed for wheezing or shortness of breath. 01/10/16  Yes Ria Bush, MD  b complex vitamins tablet Take 1 tablet by mouth daily.   Yes [provider]  Biotin 2500 MCG CAPS Take 1 capsule by mouth daily.   Yes [provider]  Cholecalciferol (VITAMIN D) 2000 UNITS CAPS Take 2,000 Units by mouth daily. Reported on 01/30/2016   Yes [provider]  clotrimazole (LOTRIMIN) 1 % cream Apply 1 application topically 2 (two) times daily. Patient taking differently: Apply 1 application topically 2 (two) times daily as needed (for rash).  05/02/15  Yes Pleas Koch, NP  Coenzyme Q10 (COQ-10) 100 MG CAPS Take 100 mg by mouth daily.    Yes [provider]  Cyanocobalamin (B-12 PO) Take 10,000 mcg by mouth 3 (three) times a week.    Yes [provider]  DEXILANT 60 MG capsule TAKE 1 CAPSULE DAILY Patient taking differently: Take 60 mg by mouth once a day 02/03/17  Yes Milus Banister, MD  Dextran 70-Hypromellose, PF, (ARTIFICIAL TEARS PF) 0.1-0.3 % SOLN Place 1-2 drops into both eyes 3 (three) times daily as needed (for dry eyes).    Yes [provider]  docusate sodium (COLACE) 100 MG capsule Take 100 mg by mouth at bedtime.    Yes [provider]  fenofibrate  (TRICOR) 145 MG tablet Take 1 tablet (145 mg total) by mouth daily. 12/09/16  Yes Ria Bush, MD  fexofenadine (ALLEGRA) 180 MG tablet Take 180 mg by mouth daily as needed for allergies.    Yes [provider]  Multiple Vitamins-Minerals (MULTIVITAMIN PO) Take 1 tablet by mouth daily.    Yes [provider]  simvastatin (ZOCOR) 40 MG tablet Take 1 tablet (40 mg total) by mouth daily. 12/09/16  Yes Ria Bush, MD  venlafaxine [name]) 75 MG tablet TAKE ONE TABLET TWICE DAILY WITH MEALS 01/06/17  Yes Ria Bush, MD  warfarin (COUMADIN) 2.5 MG tablet Take 1 tablet (2.5 mg total) by mouth daily. As directed Patient taking differently: Take 2.5-3.75 mg by mouth daily. 2.5 mg at bedtime on Sun/Mon/Tues/Thurs/Fri and 3.75 mg on Wed/Sat 08/06/16  Yes Ria Bush, MD    Family History Family History  Problem Relation Age of Onset  . Stroke Mother   . Hypertension Mother   . Cancer Brother        prostate with mets  . Blindness Brother        legally  . Diabetes Brother   . Pulmonary embolism Sister        from shoulder operation  . Alcohol abuse Brother   . Colon cancer Neg Hx   . Esophageal cancer Neg Hx   . Rectal cancer Neg Hx   . Stomach cancer Neg Hx     Social History Social History  Substance Use Topics  . Smoking status: Never Smoker  . Smokeless tobacco: Never Used  . Alcohol use No     Allergies   Lactose intolerance (gi); Sulfa antibiotics; Sulfadiazine; Aricept [donepezil hcl]; Lipitor [atorvastatin]; Nizatidine; and Penicillins   Review of Systems Review of Systems  Neurological: Positive for dizziness and weakness.  All other systems reviewed and are negative.    Physical Exam Updated Vital Signs BP (!) 116/53   Pulse 83   Temp 99.6 F (37.6 C) (Oral)   Resp 16   SpO2 94%   Physical Exam  Constitutional: He is oriented to person, place, and time.  Chronically ill, demented  HENT:  Head: Normocephalic.    Mouth/Throat: Oropharynx is clear and moist.  Eyes: Conjunctivae and EOM are normal. Pupils are equal, round, and reactive to light.  Neck: Normal range of motion. Neck supple.  Cardiovascular: Normal rate, regular rhythm and normal heart sounds.   Pulmonary/Chest: Effort normal and breath sounds normal. No respiratory distress. He has no wheezes.  Abdominal: Soft. Bowel sounds are normal. He exhibits no distension. There is no tenderness.  Musculoskeletal: Normal range of motion.  Neurological: He is alert and oriented to person, place, and time.  CN 2-12 intact. No obvious facial droop. Nl strength and sensation bilaterally. No pronator drift bilaterally   Skin: Skin is warm.  Psychiatric: He has a normal mood and affect.  Nursing note and vitals reviewed.    ED Treatments / Results  Labs (all labs ordered are listed, but only abnormal results are displayed) Labs Reviewed  CBC - Abnormal; Notable for the following:       Result Value   RBC 2.93 (*)    Hemoglobin 11.1 (*)    HCT 33.8 (*)    MCV 115.4 (*)    MCH 37.9 (*)    All other components within normal limits  COMPREHENSIVE METABOLIC PANEL - Abnormal; Notable for the following:    CO2 21 (*)    Glucose, Bld 151 (*)    BUN 21 (*)    Creatinine, Ser 1.49 (*)    Total Protein 6.1 (*)    AST 44 (*)    Alkaline Phosphatase 37 (*)    GFR calc non Af Amer 42 (*)    GFR calc Af Amer 48 (*)    All other components within normal limits  URINALYSIS, ROUTINE W REFLEX MICROSCOPIC - Abnormal; Notable for the following:    Color, Urine AMBER (*)    APPearance HAZY (*)    All other components within normal limits  PROTIME-INR - Abnormal; Notable for the following:    Prothrombin Time 28.5 (*)    All other components within normal limits  CBG MONITORING, ED - Abnormal; Notable for the following:    Glucose-Capillary 165 (*)    All other components within normal limits  I-STAT CHEM 8, ED - Abnormal; Notable for the following:     BUN 25 (*)    Creatinine, Ser 1.40 (*)    Glucose, Bld 154 (*)    Hemoglobin 10.9 (*)    HCT 32.0 (*)    All other components within normal limits  DIFFERENTIAL  I-STAT TROPOININ, ED    EKG  EKG Interpretation  Date/Time:  Monday March 02 2017 12:55:20 EDT Ventricular Rate:  98 PR Interval:    QRS Duration: 86 QT Interval:  358 QTC Calculation: 457 R Axis:   -17 Text Interpretation:  Undetermined rhythm Otherwise normal ECG No significant change since last tracing Confirmed by Wandra Arthurs [phone]) on 03/02/2017 1:39:55 PM       Radiology Dg Chest Port 1 View  Result Date: 03/02/2017 CLINICAL DATA:  Confusion EXAM: PORTABLE CHEST 1 VIEW COMPARISON:  07/08/2016 FINDINGS: Cardiac shadow is at the upper limits of normal in size. Calcified hilar lymph nodes are noted consistent with prior granulomatous disease. The lungs are well aerated bilaterally without focal infiltrate. Old healed bilateral rib fractures are noted IMPRESSION: No acute abnormality noted. Electronically Signed   By: Inez Catalina M.D.   On: 03/02/2017 14:11    Procedures Procedures (including critical care time)  Medications Ordered in ED Medications -  No data to display   Initial Impression / Assessment and Plan / ED Course  I have reviewed the triage vital signs and the nursing notes.  Pertinent labs & imaging results that were available during my care of the patient were reviewed by me and considered in my medical decision making (see chart for details).     [name] is a 83 y.o. male here with weakness, dizziness. No slurred speech, no unilateral weakness. Hemodynamically stable. He is on coumadin already. I called Dr. Cristobal Goldmann from neuro. He recommend MRI brain and if stable, no changes in meds. Will get AMS workup as well with labs, UA, CXR.   3:38 PM Labs at baseline. INR 2.6. CXR and UA clear. MRI brain pending. Signed out to Dr. Vanita Panda. If MRI showed no stroke, anticipate discharge.  Wife at bedside updated with plan.    Final Clinical Impressions(s) / ED Diagnoses   Final diagnoses:  None    New Prescriptions New Prescriptions   No medications on file     Drenda Freeze, MD 03/02/17 1539

## 2017-03-02 NOTE — ED Triage Notes (Addendum)
Pt presents with c/o altered mental status. The symptoms began acutely at 1100 today when he sat up from lying on table at physical therapy. He felt dizzy and was unable to follow normal commands. When his wife took him to the bathroom she noticed that he was wiping his leg instead of his bottom and was unable to comprehend her commands. His symptoms have persisted since onset. He denies any unilateral weakness, slurred speech, facial droop. He has dementia but is not normally this confused at his baseline. I discussed this case with dr Billy Fischer and no code stroke will be called at this time.

## 2017-03-02 NOTE — ED Notes (Signed)
Wife at bedside reports pt was at physical therapy today at 1100 and had an episode where he was dizzy and unable to follow commands. Wife reports he never lost strength during episode but was confused. Wife states she took him to the RR and he was wiping his brief and not his bottom.

## 2017-03-03 ENCOUNTER — Encounter: Payer: Self-pay | Admitting: Family Medicine

## 2017-03-03 ENCOUNTER — Ambulatory Visit (INDEPENDENT_AMBULATORY_CARE_PROVIDER_SITE_OTHER): Payer: Medicare Other | Admitting: Family Medicine

## 2017-03-03 VITALS — BP 116/72 | HR 93 | Temp 98.6°F | Ht 64.0 in | Wt 171.5 lb

## 2017-03-03 DIAGNOSIS — R2681 Unsteadiness on feet: Secondary | ICD-10-CM

## 2017-03-03 DIAGNOSIS — I639 Cerebral infarction, unspecified: Secondary | ICD-10-CM

## 2017-03-03 DIAGNOSIS — H9193 Unspecified hearing loss, bilateral: Secondary | ICD-10-CM | POA: Diagnosis not present

## 2017-03-03 DIAGNOSIS — F0390 Unspecified dementia without behavioral disturbance: Secondary | ICD-10-CM

## 2017-03-03 DIAGNOSIS — L270 Generalized skin eruption due to drugs and medicaments taken internally: Secondary | ICD-10-CM | POA: Diagnosis not present

## 2017-03-03 DIAGNOSIS — S72002A Fracture of unspecified part of neck of left femur, initial encounter for closed fracture: Secondary | ICD-10-CM | POA: Diagnosis not present

## 2017-03-03 DIAGNOSIS — N183 Chronic kidney disease, stage 3 unspecified: Secondary | ICD-10-CM

## 2017-03-03 DIAGNOSIS — I5030 Unspecified diastolic (congestive) heart failure: Secondary | ICD-10-CM | POA: Diagnosis not present

## 2017-03-03 MED ORDER — MEMANTINE HCL 5 MG PO TABS: ORAL_TABLET | ORAL | 3 refills | Status: DC

## 2017-03-03 NOTE — Patient Instructions (Addendum)
We will refer you to audiologist and call with appointment. Start namenda 5mg  daily for 1 week then increase to 10mg  daily.  Good to see you today. Return in 2 months for follow up visit.

## 2017-03-03 NOTE — Progress Notes (Signed)
BP 116/72   Pulse 93   Temp 98.6 F (37 C)   Ht 5\' 4"  (1.626 m)   Wt 171 lb 8 oz (77.8 kg)   SpO2 97%   BMI 29.44 kg/m    CC: AMW f/u visit Subjective:    Patient ID: [name], male    DOB: [birth year], 83 y.o.   MRN: [id number]  HPI: [name] is a 83 y.o. male presenting on 03/03/2017 for Annual Exam   Saw Katha Cabal last week for medicare wellness visit. Note reviewed. Failed hearing screen - interested in audiology referral. Multiple falls with injury. Depression score of 11.   Seen yesterday at ER with episode of dizziness and weakness. He also had trouble following commands. Workup included normal CXR and stable kidney insufficiency, anemia. Normal urinalysis. Head CT stable. MRI was obtained per neurology recommendations - stable (chronic microvascular ischemia).   He did feel very badly during this episode, did not lose consciousness.   He did have L hip fracture 03/2016 s/p pinning and rehab. He continues to see PT - finds TENS unit to be very helpful.  Over last 2 weeks noticing worsening mental status. Wife noticing worsening apraxia.   Prior rash to aricept.  He does have f/u with neurology Dr Jannifer Franklin July 9th.   Preventative: Prostate exam - once elevated to 7s, then dropped back to 1s. Recommended by Dr. Jeffie Pollock to not recheck unless urinary sxs.  COLONOSCOPY Date: 04/2014 3 polyps, adenomatous, f/u open ended given age [name]  Flu shot done  Tdap 2011 pneumovax 2000, prevnar 2015 Shingles shot 2006 Discussed advanced directives - this has been updated - they will bring [place] a copy. HCPOA is wife, [name]. Does not want prolonged life support if terminal.  Seat belt use discussed No changing moles on skin. Sees [facility] regularly.   Married with 2 children Husband of Kiandre Spagnolo  Retired: was principal  Activity: walks dog 2-3 times daily about 56min, frequent stops  Diet: some water, good fruits/vegetables, fish 1x/wk, no sodas.  Relevant  past medical, surgical, family and social history reviewed and updated as indicated. Interim medical history since our last visit reviewed. Allergies and medications reviewed and updated. Outpatient Medications Prior to Visit  Medication Sig Dispense Refill  . acetaminophen (TYLENOL) 325 MG tablet Take 325-650 mg by mouth every 6 (six) hours as needed (for hip pain).     Marland Kitchen albuterol (PROVENTIL HFA;VENTOLIN HFA) 108 (90 Base) MCG/ACT inhaler Inhale 2 puffs into the lungs every 6 (six) hours as needed for wheezing or shortness of breath. 3 Inhaler 3  . b complex vitamins tablet Take 1 tablet by mouth daily.    . Biotin 2500 MCG CAPS Take 1 capsule by mouth daily.    . Cholecalciferol (VITAMIN D) 2000 UNITS CAPS Take 2,000 Units by mouth daily. Reported on 01/30/2016    . clotrimazole (LOTRIMIN) 1 % cream Apply 1 application topically 2 (two) times daily. (Patient taking differently: Apply 1 application topically 2 (two) times daily as needed (for rash). ) 30 g 0  . Coenzyme Q10 (COQ-10) 100 MG CAPS Take 100 mg by mouth daily.     . Cyanocobalamin (B-12 PO) Take 10,000 mcg by mouth 3 (three) times a week.     . DEXILANT 60 MG capsule TAKE 1 CAPSULE DAILY (Patient taking differently: Take 60 mg by mouth once a day) 90 capsule 3  . Dextran 70-Hypromellose, PF, (ARTIFICIAL TEARS PF) 0.1-0.3 % SOLN Place 1-2  drops into both eyes 3 (three) times daily as needed (for dry eyes).     Marland Kitchen docusate sodium (COLACE) 100 MG capsule Take 100 mg by mouth at bedtime.     . fenofibrate (TRICOR) 145 MG tablet Take 1 tablet (145 mg total) by mouth daily. 90 tablet 3  . fexofenadine (ALLEGRA) 180 MG tablet Take 180 mg by mouth daily as needed for allergies.     . Multiple Vitamins-Minerals (MULTIVITAMIN PO) Take 1 tablet by mouth daily.     . simvastatin (ZOCOR) 40 MG tablet Take 1 tablet (40 mg total) by mouth daily. 90 tablet 3  . venlafaxine (EFFEXOR) 75 MG tablet TAKE ONE TABLET TWICE DAILY WITH MEALS 60 tablet 6  .  warfarin (COUMADIN) 2.5 MG tablet Take 1 tablet (2.5 mg total) by mouth daily. As directed (Patient taking differently: Take 2.5-3.75 mg by mouth daily. 2.5 mg at bedtime on Sun/Mon/Tues/Thurs/Fri and 3.75 mg on Wed/Sat) 90 tablet 1   Facility-Administered Medications Prior to Visit  Medication Dose Route Frequency Provider Last Rate Last Dose  . 0.9 %  sodium chloride infusion  500 mL Intravenous Continuous Milus Banister, MD         Per HPI unless specifically indicated in ROS section below Review of Systems     Objective:    BP 116/72   Pulse 93   Temp 98.6 F (37 C)   Ht 5\' 4"  (1.626 m)   Wt 171 lb 8 oz (77.8 kg)   SpO2 97%   BMI 29.44 kg/m   Wt Readings from Last 3 Encounters:  03/03/17 171 lb 8 oz (77.8 kg)  02/26/17 169 lb (76.7 kg)  11/14/16 169 lb (76.7 kg)    Physical Exam  Constitutional: He appears well-developed and well-nourished. No distress.  HENT:  Head: Normocephalic and atraumatic.  Mouth/Throat: Oropharynx is clear and moist. No oropharyngeal exudate.  Eyes: Conjunctivae and EOM are normal. Pupils are equal, round, and reactive to light.  Neck: Normal range of motion. Neck supple. No thyromegaly present.  Cardiovascular: Normal rate, regular rhythm, normal heart sounds and intact distal pulses.   No murmur heard. Pulmonary/Chest: Effort normal and breath sounds normal. No respiratory distress. He has no wheezes. He has no rales.  Musculoskeletal: He exhibits no edema.  Lymphadenopathy:    He has no cervical adenopathy.  Skin: Skin is warm and dry. No rash noted.  Psychiatric: He has a normal mood and affect.  Nursing note and vitals reviewed.     Assessment & Plan:   Problem List Items Addressed This Visit    (HFpEF) heart failure with preserved ejection fraction (HCC)   Bilateral hearing loss    Passed hearing screen recently. However pt and wife with ongoing concern over noted hearing loss. Will refer back to audiology per their request.        Relevant Orders   Ambulatory referral to Audiology   CKD (chronic kidney disease) stage 3, GFR 30-59 ml/min    Chronic, stable stage 3B CKD.      Dementia    Concern for progression of dementia as endorsed by wife - more trouble with memory as well as noted apraxia. H/o intolerance to aricept. Will start namenda 5mg  with titration to 10mg  after 1 wk. Reviewed possible side effects. Also discussed possibility of different acetylcholinesterase inhibitor trial (rivastigmine patch). Consider trial vit E 2000 IU daily.      Relevant Medications   memantine (NAMENDA) 5 MG tablet   Drug  rash    Drug rash improved off lipitor and aricept.      General unsteadiness - Primary    Ongoing unsteadiness of gait, uses walker. Recent episode associated with dizziness and generalized weakness with unrevealing ER evaluation. Anticipate possible orthostasis, discussed importance of good hydration status.       Left displaced femoral neck fracture ([place])    S/p L hip fracture 2017, s/p repair with pin and rehab. Continues participating in physical therapy.           Follow up plan: Return in about 2 months (around 05/03/2017) for follow up visit.  Ria Bush, MD

## 2017-03-04 ENCOUNTER — Encounter: Payer: Self-pay | Admitting: Family Medicine

## 2017-03-04 NOTE — Assessment & Plan Note (Signed)
Ongoing unsteadiness of gait, uses walker. Recent episode associated with dizziness and generalized weakness with unrevealing ER evaluation. Anticipate possible orthostasis, discussed importance of good hydration status.

## 2017-03-04 NOTE — Assessment & Plan Note (Addendum)
Chronic, stable stage 3B CKD.

## 2017-03-04 NOTE — Assessment & Plan Note (Signed)
Drug rash improved off lipitor and aricept.

## 2017-03-04 NOTE — Assessment & Plan Note (Addendum)
Concern for progression of dementia as endorsed by wife - more trouble with memory as well as noted apraxia. H/o intolerance to aricept. Will start namenda 5mg  with titration to 10mg  after 1 wk. Reviewed possible side effects. Also discussed possibility of different acetylcholinesterase inhibitor trial (rivastigmine patch). Consider trial vit E 2000 IU daily.

## 2017-03-04 NOTE — Assessment & Plan Note (Signed)
S/p L hip fracture 2017, s/p repair with pin and rehab. Continues participating in physical therapy.

## 2017-03-04 NOTE — Assessment & Plan Note (Signed)
Passed hearing screen recently. However pt and wife with ongoing concern over noted hearing loss. Will refer back to audiology per their request.

## 2017-03-05 DIAGNOSIS — M7072 Other bursitis of hip, left hip: Secondary | ICD-10-CM | POA: Diagnosis not present

## 2017-03-05 DIAGNOSIS — M25552 Pain in left hip: Secondary | ICD-10-CM | POA: Diagnosis not present

## 2017-03-07 ENCOUNTER — Encounter: Payer: Self-pay | Admitting: Family Medicine

## 2017-03-07 ENCOUNTER — Ambulatory Visit (INDEPENDENT_AMBULATORY_CARE_PROVIDER_SITE_OTHER): Payer: Medicare Other | Admitting: Family Medicine

## 2017-03-07 VITALS — BP 120/64 | HR 98 | Temp 98.7°F | Wt 170.5 lb

## 2017-03-07 DIAGNOSIS — L98499 Non-pressure chronic ulcer of skin of other sites with unspecified severity: Secondary | ICD-10-CM | POA: Diagnosis not present

## 2017-03-07 MED ORDER — DOXYCYCLINE HYCLATE 100 MG PO TABS: 100.0000 mg | ORAL_TABLET | Freq: Two times a day (BID) | ORAL | 0 refills | Status: DC

## 2017-03-07 NOTE — Patient Instructions (Signed)
Start doxycyline today.  I have referred you to wound clinic, they will call you to followup.  Apply bag balm, with nonadherent dressing over and pad.  Follow up with Dr. Darnell Level next week.

## 2017-03-07 NOTE — Progress Notes (Signed)
[name] , [birth year], 83 y.o., male MRN: [id number] Patient Care Team    Relationship Specialty Notifications Start End  Ria Bush, MD PCP - General   08/21/10    Comment: Merged (Merged)  Birder Robson, MD Referring Physician Ophthalmology  02/26/17     Chief Complaint  Patient presents with  . Sore on Left hip    notice sore yesterday, awoke this morning noted swelling and redness...denies drainage...      Subjective: Pt presents for an OV with complaints of left posterior hip sore/wound  of 1 day duration.  Associated symptoms include nothing. He is present with his wife today. He has a h/o gait disturbance, tremor, memory disturbance and h/o left hip fx 03/2016. Patients wife states he was seen in the ED earlier this week and then followed by his PCP, after having an episode of dizziness, weakness and confusion. ED work up reported unremarkable. She feels he has improved some since then. She did notice yesterday when getting him out of the shower a location on his left posterior hip that looked like an abrasion, and today started turning red and swollen. She reports it has blackish center. She denies bleeding or drainage. Per report pt is quite active. She does not feel he rest on that hip much at all, even when sleeping he is flat on his back. He does wear adult diapers, but area of concern does not rest on a seam line.  Patient is on coumadin and has a reported h/o MRSA infection in the past.    Depression screen [facility] 2/9 02/26/2017 12/18/2015 12/18/2015 11/21/2015 11/06/2014  Decreased Interest 0 3 1 0 0  Down, Depressed, Hopeless 3 0 1 0 0  PHQ - 2 Score 3 3 2  0 0  Altered sleeping 0 3 - - -  Tired, decreased energy 3 2 - - -  Change in appetite 0 0 - - -  Feeling bad or failure about yourself  3 2 - - -  Trouble concentrating 1 3 - - -  Moving slowly or fidgety/restless 0 0 - - -  Suicidal thoughts 1 2 - - -  PHQ-9 Score 11 15 - - -  Difficult doing  work/chores Not difficult at all Very difficult - - -  Some recent data might be hidden    Allergies  Allergen Reactions  . Lactose Intolerance (Gi) Diarrhea  . Sulfa Antibiotics Hives  . Sulfadiazine Hives  . Aricept [Donepezil Hcl] Rash  . Lipitor [Atorvastatin] Rash  . Nizatidine Rash  . Penicillins Rash    Has patient had a PCN reaction causing immediate rash, facial/tongue/throat swelling, SOB or lightheadedness with hypotension: Yes Has patient had a PCN reaction causing severe rash involving mucus membranes or skin necrosis: No Has patient had a PCN reaction that required hospitalization: No Has patient had a PCN reaction occurring within the last 10 years: No If all of the above answers are "NO", then may proceed with Cephalosporin use.    Social History  Substance Use Topics  . Smoking status: Never Smoker  . Smokeless tobacco: Never Used  . Alcohol use No   Past Medical History:  Diagnosis Date  . Allergic rhinitis   . Arthritis   . Asthma    remote  . Blood transfusion 1990's  . CAD (coronary artery disease)    cath 2000 30% single vessel, normal nuclear stress test 12/03/2010, no evidence ischemia  . Candidal urethritis in male 06/15/2015  .  CKD (chronic kidney disease) stage 3, GFR 30-59 ml/min    baseline Cr 1.7  . Closed C7 fracture ([place]) 11/06/2014  . DDD (degenerative disc disease) 03/2013   by CT, diffuse multilevel cervical and lumbar spondylosis  . Depression   . DISH (diffuse idiopathic skeletal hyperostosis) 03/2013   lumbar spine on xray  . Dizziness    multifactorial, s/p PT at [facility] 06/2014 with HEP  . DVT (deep venous thrombosis) ([place]) 07/2016  . Elevated PSA    previous-normalized (followed by Dr. Jeffie Pollock, rec no repeat unless urinary sxs)  . Essential tremor 09/27/2008   reviewed eval by Dr Jannifer Franklin in chart 2011   . GERD (gastroesophageal reflux disease)    h/o PUD  . Hearing loss 06/2012   eval - rec annual exam  . History of fracture of right  hip   . History of phlebitis   . History of pyelonephritis 05/2014   hospitalization with sepsis  . HLD (hyperlipidemia)    hypertriglyceridemia  . HSV 03/08/2007   Qualifier: Diagnosis of  By: Council Mechanic MD, Hilaria Ota   . Hx pulmonary embolism 08/1991   negative hypercoagulable panel 12/2014  . Inguinal hernia   . Macrocytic anemia 2014   stable B12/folate and periph smear 05/2013, again periph smear 2017 with mature cells, mild dacrocytosis  . Osteoporosis 11/2013   DEXA T score -2.7  . Prediabetes   . Schatzki's ring 02/2012   s/p dilation [name]  . Sleep apnea    no CPAP- sleep apnea "cleared up 15 years ago"  . Stroke ([place])   . Urge incontinence   . UTI (urinary tract infection) due to Enterococcus 05/13/2015   Past Surgical History:  Procedure Laterality Date  . APPENDECTOMY  1996  . CARDIAC CATHETERIZATION  2000   30% one vessel  . CARDIOVASCULAR STRESS TEST  03/2015   no ischemia, low risk, EF 76%  . CAROTID U/S  12/28/2007   nml  . CATARACT EXTRACTION  09/2009   bilateral  . COLONOSCOPY  1998   [place]  . COLONOSCOPY  07/2010   5 polyps, adenomatous, rec rpt 3 yrs  . COLONOSCOPY  04/2014   3 polyps, adenomatous, f/u open ended given age [name]  . CT ABD W & PELVIS WO CM  07/2001   Scarring of right lung, stable negative o/w  . CT ABD W & PELVIS WO CM  11/2000   ? stones, right LL scarring  . CYSTOSCOPY  1992   for kidney stones  . EEG  11/12/2009   nml  . ESOPHAGOGASTRODUODENOSCOPY  02/2012   dilation of schatzki's ring [name]  . ESOPHAGOGASTRODUODENOSCOPY N/A 11/15/2015   Procedure: ESOPHAGOGASTRODUODENOSCOPY (EGD);  Surgeon: Gatha Mayer, MD;  Location: [facility] ENDOSCOPY;  Service: Endoscopy;  Laterality: N/A;  . ESOPHAGOGASTRODUODENOSCOPY  11/2016   esophageal erosions, GERD, esophageal stenosis dilated [name]  . EXPLORATORY LAPAROTOMY  1996   with incidental appendectomy  . HERNIA REPAIR  1979   Left  . HIP PINNING,CANNULATED Left 03/11/2016    Procedure: CANNULATED HIP PINNING;  Surgeon: Rod Can, MD;  Location: [place];  Service: Orthopedics;  Laterality: Left;  . KNEE ARTHROSCOPY  03/24/02   Right (Dr. Mauri Pole)  . MRI  11/2009   Head, nml  . ORIF FEMORAL NECK FRACTURE W/ DHS Right 08/03/2002   Dr Mauri Pole  . TONSILLECTOMY  1965  . US ECHOCARDIOGRAPHY  12/28/2007   Mild aortic valve calcification EF 55%, basically nml  . V/Q SCAN  06/1999  negative   Family History  Problem Relation Age of Onset  . Stroke Mother   . Hypertension Mother   . Cancer Brother        prostate with mets  . Blindness Brother        legally  . Diabetes Brother   . Pulmonary embolism Sister        from shoulder operation  . Alcohol abuse Brother   . Colon cancer Neg Hx   . Esophageal cancer Neg Hx   . Rectal cancer Neg Hx   . Stomach cancer Neg Hx    Allergies as of 03/07/2017      Reactions   Lactose Intolerance (gi) Diarrhea   Sulfa Antibiotics Hives   Sulfadiazine Hives   Aricept [donepezil Hcl] Rash   Lipitor [atorvastatin] Rash   Nizatidine Rash   Penicillins Rash   Has patient had a PCN reaction causing immediate rash, facial/tongue/throat swelling, SOB or lightheadedness with hypotension: Yes Has patient had a PCN reaction causing severe rash involving mucus membranes or skin necrosis: No Has patient had a PCN reaction that required hospitalization: No Has patient had a PCN reaction occurring within the last 10 years: No If all of the above answers are "NO", then may proceed with Cephalosporin use.      Medication List       Accurate as of 03/07/17 12:51 PM. Always use your most recent med list.          acetaminophen 325 MG tablet Commonly known as:  TYLENOL Take 325-650 mg by mouth every 6 (six) hours as needed (for hip pain).   albuterol 108 (90 Base) MCG/ACT inhaler Commonly known as:  PROVENTIL HFA;VENTOLIN HFA Inhale 2 puffs into the lungs every 6 (six) hours as needed for wheezing or shortness of breath.     ARTIFICIAL TEARS PF 0.1-0.3 % Soln Generic drug:  Dextran 70-Hypromellose (PF) Place 1-2 drops into both eyes 3 (three) times daily as needed (for dry eyes).   b complex vitamins tablet Take 1 tablet by mouth daily.   B-12 PO Take 10,000 mcg by mouth 3 (three) times a week.   Biotin 2500 MCG Caps Take 1 capsule by mouth daily.   clotrimazole 1 % cream Commonly known as:  LOTRIMIN Apply 1 application topically 2 (two) times daily.   CoQ-10 100 MG Caps Take 100 mg by mouth daily.   DEXILANT 60 MG capsule Generic drug:  dexlansoprazole TAKE 1 CAPSULE DAILY   docusate sodium 100 MG capsule Commonly known as:  COLACE Take 100 mg by mouth at bedtime.   fenofibrate 145 MG tablet Commonly known as:  TRICOR Take 1 tablet (145 mg total) by mouth daily.   fexofenadine 180 MG tablet Commonly known as:  ALLEGRA Take 180 mg by mouth daily as needed for allergies.   memantine 5 MG tablet Commonly known as:  NAMENDA Take 5mg  daily for 1 week then increase to 10mg  daily   MULTIVITAMIN PO Take 1 tablet by mouth daily.   simvastatin 40 MG tablet Commonly known as:  ZOCOR Take 1 tablet (40 mg total) by mouth daily.   venlafaxine 75 MG tablet Commonly known as:  EFFEXOR TAKE ONE TABLET TWICE DAILY WITH MEALS   Vitamin D 2000 units Caps Take 2,000 Units by mouth daily. Reported on 01/30/2016   warfarin 2.5 MG tablet Commonly known as:  COUMADIN Take 1 tablet (2.5 mg total) by mouth daily. As directed       All past  medical history, surgical history, allergies, family history, immunizations andmedications were updated in the EMR today and reviewed under the history and medication portions of their EMR.     ROS: Negative, with the exception of above mentioned in HPI   Objective:  BP 120/64   Pulse 98   Temp 98.7 F (37.1 C) (Oral)   Wt 170 lb 8 oz (77.3 kg)   BMI 29.27 kg/m  Body mass index is 29.27 kg/m. Gen: Afebrile. No acute distress. Nontoxic in appearance,  well developed, well nourished. Very pleasant caucasian man.  Skin: ~2 cm oval red raised area over area of bony prominence left lateral posterior hip. Eschar center. No drainage or fluctuance. no rashes, purpura or petechiae. No tenderness to area or surrounding bony prominence.  Neuro:  PERLA. EOMi. Alert. Oriented   No exam data present No results found. No results found for this or any previous visit (from the past 24 hour(s)).  Assessment/Plan: ELISHA COOKSEY is a 83 y.o. male present for OV for  1. Skin ulcer, unspecified ulcer stage ([place]) - appears to be a pressure ulcer. Discussed in detail with wife to keep area cleaned daily with warm soapy water, dressing change daily (bag balm, telfa, padding). Will treat with Doxy given prior MRSA. Really not any drainage to culture. Urgent referral to wound care for treatment.  - could consider xray on follow up to ensure hardware is stable if felt warranted. Pt was not tender at all over bony prominences.  - watch INR closely  with doxy and coumadin use - doxycycline (VIBRA-TABS) 100 MG tablet; Take 1 tablet (100 mg total) by mouth 2 (two) times daily.  Dispense: 14 tablet; Refill: 0 - Ambulatory referral to Wound Clinic - advised them to call Dr. Darnell Level Monday and get into be seen that week.   Will need to monitro INR next week with warafin use.   Reviewed expectations re: course of current medical issues.  Discussed self-management of symptoms.  Outlined signs and symptoms indicating need for more acute intervention.  Patient verbalized understanding and all questions were answered.  Patient received an After-Visit Summary.    No orders of the defined types were placed in this encounter.    Note is dictated utilizing voice recognition software. Although note has been proof read prior to signing, occasional typographical errors still can be missed. If any questions arise, please do not hesitate to call for verification.    electronically signed by:  Howard Pouch, DO  [place]

## 2017-03-09 ENCOUNTER — Telehealth: Payer: Self-pay

## 2017-03-09 NOTE — Telephone Encounter (Signed)
Noted. Will see tomorrow. 

## 2017-03-09 NOTE — Telephone Encounter (Signed)
PLEASE NOTE: All timestamps contained within this report are represented as [place] Standard Time. CONFIDENTIALTY NOTICE: This fax transmission is intended only for the addressee. It contains information that is legally privileged, confidential or otherwise protected from use or disclosure. If you are not the intended recipient, you are strictly prohibited from reviewing, disclosing, copying using or disseminating any of this information or taking any action in reliance on or regarding this information. If you have received this fax in error, please notify us immediately by telephone so that we can arrange for its return to [place]. Phone: [phone], Toll-Free: [phone], Fax: [phone] Page: 1 of 1 Call Id: [id number] [place] Patient Name: [name] Gender: Male DOB: Oct 24, 1930 Age: 83 Y 77 M 27 D Return Phone Number: [phone] (Primary), [phone] (Secondary) Address: [address] City/State/Zip: [place] [address] Client [employer] Night - Client Client Site [place] Physician Ria Bush - MD Who Is Calling Patient / Member / Family / Caregiver Call Type Triage / Clinical Caller Name Jiovany Scheffel Relationship To Patient Spouse Return Phone Number [phone] (Secondary) Chief Complaint Wound Infection Reason for Call Symptomatic / Request for [place] states that her husband has an infection on his left hip that she suspects is MRSA Nurse Assessment Nurse: Julien Girt, RN, Almyra Free Date/Time Eilene Ghazi Time): 03/07/2017 9:16:39 AM Confirm and document reason for call. If symptomatic, describe symptoms. ---Caller states that her husband has an infection on his left hip, open ulcer with redness that she suspects is MRSA. States it is the size of a quarter with spreading redness. NO  drainage at this time. She noticed it on Friday but it has gotten more "angry" and increased in size overnight. Does the PT have any chronic conditions? (i.e. diabetes, asthma, etc.) ---Yes List chronic conditions. ---Hx MRSA, Fx Left Hip (03/2016), Bursitis Left hip, Hx DVT/ PE/CVA, Benign Tremor, Dementia Guidelines Guideline Title Affirmed Question Wound Infection [1] Skin around the wound has become red AND [2] larger than 2 inches (5 cm) Disp. Time Eilene Ghazi Time) Disposition Final User 03/07/2017 9:26:02 AM See Physician within 4 Hours (or PCP triage) Yes Julien Girt, RN, Almyra Free Referrals [facility] Care Advice Given Per Guideline SEE PHYSICIAN WITHIN 4 HOURS (or PCP triage): * IF OFFICE WILL BE OPEN: You need to be seen within the next 3 or 4 hours. Call your doctor's office now or as soon as it opens. * You become worse. CARE ADVICE per Wound Infection (Adult) guideline.

## 2017-03-09 NOTE — Telephone Encounter (Signed)
Per chart review tab pt was seen Sat Clinic 03/07/17.

## 2017-03-10 ENCOUNTER — Ambulatory Visit (INDEPENDENT_AMBULATORY_CARE_PROVIDER_SITE_OTHER): Payer: Medicare Other | Admitting: Family Medicine

## 2017-03-10 ENCOUNTER — Encounter: Payer: Self-pay | Admitting: Family Medicine

## 2017-03-10 VITALS — BP 116/64 | HR 76 | Temp 98.2°F | Wt 171.8 lb

## 2017-03-10 DIAGNOSIS — Z7901 Long term (current) use of anticoagulants: Secondary | ICD-10-CM

## 2017-03-10 DIAGNOSIS — I639 Cerebral infarction, unspecified: Secondary | ICD-10-CM

## 2017-03-10 DIAGNOSIS — L989 Disorder of the skin and subcutaneous tissue, unspecified: Secondary | ICD-10-CM

## 2017-03-10 DIAGNOSIS — Z5181 Encounter for therapeutic drug level monitoring: Secondary | ICD-10-CM

## 2017-03-10 LAB — POCT INR: INR: 1.5

## 2017-03-10 NOTE — Patient Instructions (Addendum)
INR check today. Continue doxycycline. Keep appointment with wound clinic. We will be in touch with coumadin dosing instructions. (cell phone and mychart).

## 2017-03-10 NOTE — Assessment & Plan Note (Addendum)
Lesions not present in skin areas of significant pressure burden so unsure if true pressure ulcers. New second lesion on anterior knee - no pressure there. Unclear etiology. Pt/wife deny recent falls or injury or scraping skin. Will continue current treatment with doxycycline, daily dressing changes, and bag balm ointment. Will appreciate wound clinic recommendations. Pt/wife agree with plan.

## 2017-03-10 NOTE — Addendum Note (Signed)
Addended by: Ellamae Sia on: 03/10/2017 09:29 AM   Modules accepted: Orders

## 2017-03-10 NOTE — Progress Notes (Signed)
BP 116/64   Pulse 76   Temp 98.2 F (36.8 C) (Oral)   Wt 171 lb 12 oz (77.9 kg)   SpO2 98%   BMI 29.48 kg/m    CC: f/u hip wound Subjective:    Patient ID: [name], male    DOB: [birth year], 83 y.o.   MRN: [id number]  HPI: [name] is a 83 y.o. male presenting on 03/10/2017 for Follow-up (pt's spouse reports more spots since saturday)   L posterior hip sore noted 03/06/2017. Seen at Saturday clinic, dx pressure ulcer and started on 7d doxycycline given h/o MRSA. He was also referred to wound clinic.   Now new wound seen lower left lateral leg.  Using bag balm.   He is on coumadin and will need coumadin checked today. Has not changed dosing.   namenda just started last night.   Relevant past medical, surgical, family and social history reviewed and updated as indicated. Interim medical history since our last visit reviewed. Allergies and medications reviewed and updated. Outpatient Medications Prior to Visit  Medication Sig Dispense Refill  . acetaminophen (TYLENOL) 325 MG tablet Take 325-650 mg by mouth every 6 (six) hours as needed (for hip pain).     Marland Kitchen albuterol (PROVENTIL HFA;VENTOLIN HFA) 108 (90 Base) MCG/ACT inhaler Inhale 2 puffs into the lungs every 6 (six) hours as needed for wheezing or shortness of breath. 3 Inhaler 3  . b complex vitamins tablet Take 1 tablet by mouth daily.    . Biotin 2500 MCG CAPS Take 1 capsule by mouth daily.    . Cholecalciferol (VITAMIN D) 2000 UNITS CAPS Take 2,000 Units by mouth daily. Reported on 01/30/2016    . clotrimazole (LOTRIMIN) 1 % cream Apply 1 application topically 2 (two) times daily. (Patient taking differently: Apply 1 application topically 2 (two) times daily as needed (for rash). ) 30 g 0  . Coenzyme Q10 (COQ-10) 100 MG CAPS Take 100 mg by mouth daily.     . Cyanocobalamin (B-12 PO) Take 10,000 mcg by mouth 3 (three) times a week.     . DEXILANT 60 MG capsule TAKE 1 CAPSULE DAILY (Patient taking differently:  Take 60 mg by mouth once a day) 90 capsule 3  . Dextran 70-Hypromellose, PF, (ARTIFICIAL TEARS PF) 0.1-0.3 % SOLN Place 1-2 drops into both eyes 3 (three) times daily as needed (for dry eyes).     Marland Kitchen docusate sodium (COLACE) 100 MG capsule Take 100 mg by mouth at bedtime.     Marland Kitchen doxycycline (VIBRA-TABS) 100 MG tablet Take 1 tablet (100 mg total) by mouth 2 (two) times daily. 14 tablet 0  . fenofibrate (TRICOR) 145 MG tablet Take 1 tablet (145 mg total) by mouth daily. 90 tablet 3  . fexofenadine (ALLEGRA) 180 MG tablet Take 180 mg by mouth daily as needed for allergies.     . memantine (NAMENDA) 5 MG tablet Take 5mg  daily for 1 week then increase to 10mg  daily 60 tablet 3  . Multiple Vitamins-Minerals (MULTIVITAMIN PO) Take 1 tablet by mouth daily.     . simvastatin (ZOCOR) 40 MG tablet Take 1 tablet (40 mg total) by mouth daily. 90 tablet 3  . venlafaxine (EFFEXOR) 75 MG tablet TAKE ONE TABLET TWICE DAILY WITH MEALS 60 tablet 6  . warfarin (COUMADIN) 2.5 MG tablet Take 1 tablet (2.5 mg total) by mouth daily. As directed (Patient taking differently: Take 2.5-3.75 mg by mouth daily. 2.5 mg at bedtime on Sun/Mon/Tues/Thurs/Fri and  3.75 mg on Wed/Sat) 90 tablet 1   Facility-Administered Medications Prior to Visit  Medication Dose Route Frequency Provider Last Rate Last Dose  . 0.9 %  sodium chloride infusion  500 mL Intravenous Continuous Milus Banister, MD         Per HPI unless specifically indicated in ROS section below Review of Systems     Objective:    BP 116/64   Pulse 76   Temp 98.2 F (36.8 C) (Oral)   Wt 171 lb 12 oz (77.9 kg)   SpO2 98%   BMI 29.48 kg/m   Wt Readings from Last 3 Encounters:  03/10/17 171 lb 12 oz (77.9 kg)  03/07/17 170 lb 8 oz (77.3 kg)  03/03/17 171 lb 8 oz (77.8 kg)    Physical Exam  Constitutional: He appears well-developed and well-nourished. No distress.  Skin: Skin is warm and dry. There is erythema.  Small crescent shaped ulcer posterior left  hip with mild surrounding erythema, healthy granulation tissue present. L lateral knee with 3cm area denuded skin without known inciting trauma/injury  Nursing note and vitals reviewed.     Assessment & Plan:   Problem List Items Addressed This Visit    Encounter for current long-term use of anticoagulants    Check INR today, dose coumadin accordingly (on doxy)  INR = 1.5 (he did miss a coumadin dose 4 days ago) rec increase coumadin to 3.75mg  today then resume prior dosing. Recheck in 1 week.       Skin lesion of left leg - Primary    Lesions not present in skin areas of significant pressure burden so unsure if true pressure ulcers. New second lesion on anterior knee - no pressure there. Unclear etiology. Pt/wife deny recent falls or injury or scraping skin. Will continue current treatment with doxycycline, daily dressing changes, and bag balm ointment. Will appreciate wound clinic recommendations. Pt/wife agree with plan.           Follow up plan: Return if symptoms worsen or fail to improve.  Ria Bush, MD

## 2017-03-10 NOTE — Assessment & Plan Note (Addendum)
Check INR today, dose coumadin accordingly (on doxy)  INR = 1.5 (he did miss a coumadin dose 4 days ago) rec increase coumadin to 3.75mg  today then resume prior dosing. Recheck in 1 week.

## 2017-03-16 ENCOUNTER — Encounter: Payer: Self-pay | Admitting: Neurology

## 2017-03-16 ENCOUNTER — Ambulatory Visit (INDEPENDENT_AMBULATORY_CARE_PROVIDER_SITE_OTHER): Payer: Medicare Other | Admitting: Neurology

## 2017-03-16 VITALS — BP 128/60 | HR 60 | Ht 64.0 in | Wt 172.0 lb

## 2017-03-16 DIAGNOSIS — F0390 Unspecified dementia without behavioral disturbance: Secondary | ICD-10-CM | POA: Diagnosis not present

## 2017-03-16 DIAGNOSIS — R269 Unspecified abnormalities of gait and mobility: Secondary | ICD-10-CM | POA: Diagnosis not present

## 2017-03-16 DIAGNOSIS — R410 Disorientation, unspecified: Secondary | ICD-10-CM | POA: Diagnosis not present

## 2017-03-16 DIAGNOSIS — E538 Deficiency of other specified B group vitamins: Secondary | ICD-10-CM | POA: Diagnosis not present

## 2017-03-16 DIAGNOSIS — G250 Essential tremor: Secondary | ICD-10-CM | POA: Diagnosis not present

## 2017-03-16 DIAGNOSIS — I639 Cerebral infarction, unspecified: Secondary | ICD-10-CM

## 2017-03-16 HISTORY — DX: Unspecified abnormalities of gait and mobility: R26.9

## 2017-03-16 NOTE — Progress Notes (Signed)
Reason for visit: Confusion  [name] is an 83 y.o. male  History of present illness:  [name] is an 83 year old right-handed white male with a history of a gait disorder, and an essential tremor. The patient has an underlying dementia, but on 03/02/2017 the patient was in physical therapy. He became confused and dizzy. He was taken to the emergency room and a MRI of the brain did not show acute changes. The patient underwent blood work showing chronic stable renal insufficiency, urinalysis was unremarkable. The patient has improved some but he has remained more confused, he requires assistance with performing activities of daily living. He is still walking with a walker, he has not had any further falls. He has no focal weakness. The patient has not gotten back to his baseline prior to June 25. The patient denies headache or new numbness or weakness of the extremities. He does have some dizziness when he stands up, he typically will run low blood pressures. He comes in today for an urgent evaluation.  Past Medical History:  Diagnosis Date  . Allergic rhinitis   . Arthritis   . Asthma    remote  . Blood transfusion 1990's  . CAD (coronary artery disease)    cath 2000 30% single vessel, normal nuclear stress test 12/03/2010, no evidence ischemia  . Candidal urethritis in male 06/15/2015  . CKD (chronic kidney disease) stage 3, GFR 30-59 ml/min    baseline Cr 1.7  . Closed C7 fracture ([place]) 11/06/2014  . DDD (degenerative disc disease) 03/2013   by CT, diffuse multilevel cervical and lumbar spondylosis  . Depression   . DISH (diffuse idiopathic skeletal hyperostosis) 03/2013   lumbar spine on xray  . Dizziness    multifactorial, s/p PT at [facility] 06/2014 with HEP  . DVT (deep venous thrombosis) ([place]) 07/2016  . Elevated PSA    previous-normalized (followed by Dr. Jeffie Pollock, rec no repeat unless urinary sxs)  . Essential tremor 09/27/2008   reviewed eval by Dr Jannifer Franklin in chart 2011     . Gait abnormality 03/16/2017  . GERD (gastroesophageal reflux disease)    h/o PUD  . Hearing loss 06/2012   eval - rec annual exam  . History of fracture of right hip   . History of phlebitis   . History of pyelonephritis 05/2014   hospitalization with sepsis  . HLD (hyperlipidemia)    hypertriglyceridemia  . HSV 03/08/2007   Qualifier: Diagnosis of  By: Council Mechanic MD, Hilaria Ota   . Hx pulmonary embolism 08/1991   negative hypercoagulable panel 12/2014  . Inguinal hernia   . Macrocytic anemia 2014   stable B12/folate and periph smear 05/2013, again periph smear 2017 with mature cells, mild dacrocytosis  . Osteoporosis 11/2013   DEXA T score -2.7  . Prediabetes   . Schatzki's ring 02/2012   s/p dilation [name]  . Sleep apnea    no CPAP- sleep apnea "cleared up 15 years ago"  . Stroke ([place])   . Urge incontinence   . UTI (urinary tract infection) due to Enterococcus 05/13/2015    Past Surgical History:  Procedure Laterality Date  . APPENDECTOMY  1996  . CARDIAC CATHETERIZATION  2000   30% one vessel  . CARDIOVASCULAR STRESS TEST  03/2015   no ischemia, low risk, EF 76%  . CAROTID U/S  12/28/2007   nml  . CATARACT EXTRACTION  09/2009   bilateral  . COLONOSCOPY  1998   [place]  .  COLONOSCOPY  07/2010   5 polyps, adenomatous, rec rpt 3 yrs  . COLONOSCOPY  04/2014   3 polyps, adenomatous, f/u open ended given age [name]  . CT ABD W & PELVIS WO CM  07/2001   Scarring of right lung, stable negative o/w  . CT ABD W & PELVIS WO CM  11/2000   ? stones, right LL scarring  . CYSTOSCOPY  1992   for kidney stones  . EEG  11/12/2009   nml  . ESOPHAGOGASTRODUODENOSCOPY  02/2012   dilation of schatzki's ring [name]  . ESOPHAGOGASTRODUODENOSCOPY N/A 11/15/2015   Procedure: ESOPHAGOGASTRODUODENOSCOPY (EGD);  Surgeon: Gatha Mayer, MD;  Location: [facility] ENDOSCOPY;  Service: Endoscopy;  Laterality: N/A;  . ESOPHAGOGASTRODUODENOSCOPY  11/2016   esophageal erosions, GERD, esophageal  stenosis dilated [name]  . EXPLORATORY LAPAROTOMY  1996   with incidental appendectomy  . HERNIA REPAIR  1979   Left  . HIP PINNING,CANNULATED Left 03/11/2016   Procedure: CANNULATED HIP PINNING;  Surgeon: Rod Can, MD;  Location: [place];  Service: Orthopedics;  Laterality: Left;  . KNEE ARTHROSCOPY  03/24/02   Right (Dr. Mauri Pole)  . MRI  11/2009   Head, nml  . ORIF FEMORAL NECK FRACTURE W/ DHS Right 08/03/2002   Dr Mauri Pole  . TONSILLECTOMY  1965  . US ECHOCARDIOGRAPHY  12/28/2007   Mild aortic valve calcification EF 55%, basically nml  . V/Q SCAN  06/1999   negative    Family History  Problem Relation Age of Onset  . Stroke Mother   . Hypertension Mother   . Cancer Brother        prostate with mets  . Blindness Brother        legally  . Diabetes Brother   . Pulmonary embolism Sister        from shoulder operation  . Alcohol abuse Brother   . Colon cancer Neg Hx   . Esophageal cancer Neg Hx   . Rectal cancer Neg Hx   . Stomach cancer Neg Hx     Social history:  reports that he has never smoked. He has never used smokeless tobacco. He reports that he does not drink alcohol or use drugs.    Allergies  Allergen Reactions  . Lactose Intolerance (Gi) Diarrhea  . Sulfa Antibiotics Hives  . Sulfadiazine Hives  . Aricept [Donepezil Hcl] Rash  . Lipitor [Atorvastatin] Rash  . Nizatidine Rash  . Penicillins Rash    Has patient had a PCN reaction causing immediate rash, facial/tongue/throat swelling, SOB or lightheadedness with hypotension: Yes Has patient had a PCN reaction causing severe rash involving mucus membranes or skin necrosis: No Has patient had a PCN reaction that required hospitalization: No Has patient had a PCN reaction occurring within the last 10 years: No If all of the above answers are "NO", then may proceed with Cephalosporin use.     Medications:  Prior to Admission medications   Medication Sig Start Date End Date Taking? Authorizing Provider    acetaminophen (TYLENOL) 325 MG tablet Take 325-650 mg by mouth every 6 (six) hours as needed (for hip pain).    Yes [provider]  albuterol (PROVENTIL HFA;VENTOLIN HFA) 108 (90 Base) MCG/ACT inhaler Inhale 2 puffs into the lungs every 6 (six) hours as needed for wheezing or shortness of breath. 01/10/16  Yes Ria Bush, MD  b complex vitamins tablet Take 1 tablet by mouth daily.   Yes [provider]  Biotin 2500 MCG CAPS Take 1  capsule by mouth daily.   Yes [provider]  Cholecalciferol (VITAMIN D) 2000 UNITS CAPS Take 2,000 Units by mouth daily. Reported on 01/30/2016   Yes [provider]  clotrimazole (LOTRIMIN) 1 % cream Apply 1 application topically 2 (two) times daily. Patient taking differently: Apply 1 application topically 2 (two) times daily as needed (for rash).  05/02/15  Yes Pleas Koch, NP  Coenzyme Q10 (COQ-10) 100 MG CAPS Take 100 mg by mouth daily.    Yes [provider]  Cyanocobalamin (B-12 PO) Take 10,000 mcg by mouth 3 (three) times a week.    Yes [provider]  DEXILANT 60 MG capsule TAKE 1 CAPSULE DAILY Patient taking differently: Take 60 mg by mouth once a day 02/03/17  Yes Milus Banister, MD  Dextran 70-Hypromellose, PF, (ARTIFICIAL TEARS PF) 0.1-0.3 % SOLN Place 1-2 drops into both eyes 3 (three) times daily as needed (for dry eyes).    Yes [provider]  docusate sodium (COLACE) 100 MG capsule Take 100 mg by mouth at bedtime.    Yes [provider]  fenofibrate (TRICOR) 145 MG tablet Take 1 tablet (145 mg total) by mouth daily. 12/09/16  Yes Ria Bush, MD  fexofenadine (ALLEGRA) 180 MG tablet Take 180 mg by mouth daily as needed for allergies.    Yes [provider]  memantine (NAMENDA) 5 MG tablet Take 5mg  daily for 1 week then increase to 10mg  daily 03/03/17  Yes Ria Bush, MD  Multiple Vitamins-Minerals (MULTIVITAMIN PO) Take 1 tablet by mouth daily.     Yes [provider]  simvastatin (ZOCOR) 40 MG tablet Take 1 tablet (40 mg total) by mouth daily. 12/09/16  Yes Ria Bush, MD  venlafaxine [facility]) 75 MG tablet TAKE ONE TABLET TWICE DAILY WITH MEALS 01/06/17  Yes Ria Bush, MD  warfarin (COUMADIN) 2.5 MG tablet Take 1 tablet (2.5 mg total) by mouth daily. As directed Patient taking differently: Take 2.5-3.75 mg by mouth daily. 2.5 mg at bedtime on Sun/Mon/Tues/Thurs/Fri and 3.75 mg on Wed/Sat 08/06/16  Yes Ria Bush, MD    ROS:  Out of a complete 14 system review of symptoms, the patient complains only of the following symptoms, and all other reviewed systems are negative.  Activity change, decreased activity Difficulty swallowing Leg swelling Daytime sleepiness Difficulty urinating, frequency of urination, urinary urgency Joint pain, muscle cramps, walking difficulties, neck stiffness Memory loss, dizziness, speech difficulty Confusion, decreased concentration, depression, hallucinations  Blood pressure 128/60, pulse 60, height 5\' 4"  (1.626 m), weight 172 lb (78 kg).  Physical Exam  General: The patient is alert and cooperative at the time of the examination.  Skin: 1+ edema at ankles is noted bilaterally.   Neurologic Exam  Mental status: The patient is alert and oriented x 2 at the time of the examination (not oriented to date). The Mini-Mental Status Examination done today shows a total score 21/28. The patient is able to name 7 animals in 30 seconds..   Cranial nerves: Facial symmetry is present. Speech is normal, no aphasia or dysarthria is noted. Extraocular movements are full. Visual fields are full.  Motor: The patient has good strength in all 4 extremities.  Sensory examination: Soft touch sensation is symmetric on the face, arms, and legs.  Coordination: The patient has good finger-nose-finger and heel-to-shin bilaterally, but he does have apraxia with the use of the lower  extremities.  Gait and station: The patient has a slightly wide-based, unsteady gait, the patient  uses a walker for ambulation. Romberg is negative. No drift is seen.  Reflexes: Deep tendon reflexes are symmetric.   MRI brain 03/02/17:  IMPRESSION: Findings of chronic microvascular ischemia without acute intracranial abnormality.  * MRI scan images were reviewed online. I agree with the written report.    Assessment/Plan:  1. Essential tremor  2. Memory disturbance with exacerbation  3. Gait disorder  The patient has had a sudden change in mental status that occurred on 25th of June 2018 during physical therapy. The patient has not returned to baseline, MRI of the brain did not show an acute stroke. The etiology of the change in functional level is not clear. The patient will be sent for further blood work today. We will repeat the urinalysis. He will follow-up in about 4 or 5 months.  Jill Alexanders MD 03/16/2017 12:32 PM  [facility] [address] [place], [place] [address]  Phone [phone] Fax [phone]

## 2017-03-17 ENCOUNTER — Ambulatory Visit (INDEPENDENT_AMBULATORY_CARE_PROVIDER_SITE_OTHER): Payer: Medicare Other

## 2017-03-17 ENCOUNTER — Encounter: Payer: Medicare Other | Attending: Physician Assistant | Admitting: Physician Assistant

## 2017-03-17 ENCOUNTER — Other Ambulatory Visit: Payer: Medicare Other

## 2017-03-17 DIAGNOSIS — D649 Anemia, unspecified: Secondary | ICD-10-CM | POA: Insufficient documentation

## 2017-03-17 DIAGNOSIS — I639 Cerebral infarction, unspecified: Secondary | ICD-10-CM | POA: Diagnosis not present

## 2017-03-17 DIAGNOSIS — S80212A Abrasion, left knee, initial encounter: Secondary | ICD-10-CM | POA: Insufficient documentation

## 2017-03-17 DIAGNOSIS — N183 Chronic kidney disease, stage 3 (moderate): Secondary | ICD-10-CM | POA: Insufficient documentation

## 2017-03-17 DIAGNOSIS — Z5181 Encounter for therapeutic drug level monitoring: Secondary | ICD-10-CM

## 2017-03-17 DIAGNOSIS — Z880 Allergy status to penicillin: Secondary | ICD-10-CM | POA: Insufficient documentation

## 2017-03-17 DIAGNOSIS — E7800 Pure hypercholesterolemia, unspecified: Secondary | ICD-10-CM | POA: Diagnosis not present

## 2017-03-17 DIAGNOSIS — L97829 Non-pressure chronic ulcer of other part of left lower leg with unspecified severity: Secondary | ICD-10-CM | POA: Diagnosis not present

## 2017-03-17 DIAGNOSIS — Z86711 Personal history of pulmonary embolism: Secondary | ICD-10-CM | POA: Insufficient documentation

## 2017-03-17 DIAGNOSIS — Z882 Allergy status to sulfonamides status: Secondary | ICD-10-CM | POA: Diagnosis not present

## 2017-03-17 DIAGNOSIS — S71002A Unspecified open wound, left hip, initial encounter: Secondary | ICD-10-CM | POA: Insufficient documentation

## 2017-03-17 DIAGNOSIS — Z8673 Personal history of transient ischemic attack (TIA), and cerebral infarction without residual deficits: Secondary | ICD-10-CM | POA: Diagnosis not present

## 2017-03-17 DIAGNOSIS — Z86718 Personal history of other venous thrombosis and embolism: Secondary | ICD-10-CM | POA: Insufficient documentation

## 2017-03-17 DIAGNOSIS — X58XXXA Exposure to other specified factors, initial encounter: Secondary | ICD-10-CM | POA: Insufficient documentation

## 2017-03-17 LAB — AMMONIA: AMMONIA: 46 ug/dL (ref 27–102)

## 2017-03-17 LAB — URINALYSIS, ROUTINE W REFLEX MICROSCOPIC
BILIRUBIN UA: NEGATIVE
GLUCOSE, UA: NEGATIVE
Ketones, UA: NEGATIVE
Leukocytes, UA: NEGATIVE
NITRITE UA: NEGATIVE
PH UA: 6 (ref 5.0–7.5)
PROTEIN UA: NEGATIVE
RBC UA: NEGATIVE
Specific Gravity, UA: 1.018 (ref 1.005–1.030)
UUROB: 0.2 mg/dL (ref 0.2–1.0)

## 2017-03-17 LAB — POCT INR: INR: 2.8

## 2017-03-17 LAB — VITAMIN B12: Vitamin B-12: 1286 pg/mL — ABNORMAL HIGH (ref 232–1245)

## 2017-03-17 LAB — SEDIMENTATION RATE: Sed Rate: 4 mm/hr (ref 0–30)

## 2017-03-17 NOTE — Progress Notes (Signed)
This encounter was created in error - please disregard.

## 2017-03-17 NOTE — Patient Instructions (Signed)
Pre visit review using our clinic review tool, if applicable. No additional management support is needed unless otherwise documented below in the visit note. 

## 2017-03-18 NOTE — Progress Notes (Signed)
CAMDAN, BURDI ([id number]) Visit Report for 03/17/2017 Chief Complaint Document Details Patient Name: VAIBHAV, FOGLEMAN. Date of Service: 03/17/2017 8:00 AM Medical Record Number: [id number] Patient Account Number: [id number] Date of Birth/Sex: [birth year] (83 y.o. Male) Treating RN: Cornell Barman Primary Care Provider: Ria Bush Other Clinician: Referring Provider: Howard Pouch Treating Provider/Extender: Melburn Hake, HOYT Weeks in Treatment: 0 Information Obtained from: Patient Chief Complaint Left lower leg ulcers Electronic Signature(s) Signed: 03/17/2017 7:35:55 PM By: Worthy Keeler PA-C Entered By: Worthy Keeler on 03/17/2017 19:06:19 [place], Leanor Kail ([id number]) -------------------------------------------------------------------------------- Debridement Details Patient Name: [name]. Date of Service: 03/17/2017 8:00 AM Medical Record Number: [id number] Patient Account Number: [id number] Date of Birth/Sex: Jul 16, 1931 (83 y.o. Male) Treating RN: Cornell Barman Primary Care Provider: Ria Bush Other Clinician: Referring Provider: Howard Pouch Treating Provider/Extender: Melburn Hake, HOYT Weeks in Treatment: 0 Debridement Performed for Wound #1 Left Ischium Assessment: Performed By: Physician STONE III, HOYT E., PA-C Debridement: Debridement Pre-procedure Verification/Time Out Yes - 09:20 Taken: Start Time: 09:21 Pain Control: Other : lidocaine 4% Level: Skin/Subcutaneous Tissue Total Area Debrided (L x 0.6 (cm) x 0.3 (cm) = 0.18 (cm) W): Tissue and other Viable, Non-Viable, Fibrin/Slough, Subcutaneous material debrided: Instrument: Curette Bleeding: Minimum Hemostasis Achieved: Pressure End Time: 09:22 Procedural Pain: 0 Post Procedural Pain: 0 Response to Treatment: Procedure was tolerated well Post Debridement Measurements of Total Wound Length: (cm) 0.6 Width: (cm) 0.3 Depth: (cm) 0.1 Volume: (cm) 0.014 Character of  Wound/Ulcer Post Requires Further Debridement Debridement: Post Procedure Diagnosis Same as Pre-procedure Electronic Signature(s) Signed: 03/17/2017 5:36:29 PM By: Gretta Cool, BSN, RN, CWS, Kim RN, BSN Signed: 03/17/2017 7:35:55 PM By: Worthy Keeler PA-C Entered By: Gretta Cool, BSN, RN, CWS, Kim on 03/17/2017 09:21:25 [place], Leanor Kail ([id number]) -------------------------------------------------------------------------------- HPI Details Patient Name: READE, TREFZ. Date of Service: 03/17/2017 8:00 AM Medical Record Number: [id number] Patient Account Number: [id number] Date of Birth/Sex: October 06, 1930 (83 y.o. Male) Treating RN: Cornell Barman Primary Care Provider: Ria Bush Other Clinician: Referring Provider: Howard Pouch Treating Provider/Extender: Melburn Hake, HOYT Weeks in Treatment: 0 History of Present Illness HPI Description: 03/17/17 on evaluation today patient presents with wounds of the left lower extremity which have been present for about three weeks. His wife is a retired Designer, jewellery and takes care of him. Currently they have been using bag balm but otherwise no other current therapy. She feels like that the wounds of the knee region appear to be doing somewhat better unfortunately she was having issues getting these wounds to fully healed. Patient fortunately does not have a significant amount of pain although he does have some discomfort. He said previous hip replacements as well as the first injection this was several months back prior to when the wounds appeared and I do not feel it is related. Fortunately there's no evidence of significant infection and he does not go outside so these are not likely insect bites least not tick bites. Patient does have a history of DVT and pulmonary embolism in the 1990s as well as the DVT and left lower extremity in 2017. She had set in 2015, a stroke in 2017, and stage III kidney disease. He does angulate using a  walker. Electronic Signature(s) Signed: 03/17/2017 7:35:55 PM By: Worthy Keeler PA-C Entered By: Worthy Keeler on 03/17/2017 19:12:41 [place], Leanor Kail ([id number]) -------------------------------------------------------------------------------- Physical Exam Details Patient Name: EASTON, FETTY. Date of Service: 03/17/2017 8:00 AM Medical Record Number: [id number] Patient Account Number: [id number] Date of Birth/Sex: June 13, 1931 (83  y.o. Male) Treating RN: Cornell Barman Primary Care Provider: Ria Bush Other Clinician: Referring Provider: Howard Pouch Treating Provider/Extender: STONE III, HOYT Weeks in Treatment: 0 Constitutional sitting or standing blood pressure is within target range for patient.. pulse regular and within target range for patient.Marland Kitchen respirations regular, non-labored and within target range for patient.Marland Kitchen temperature within target range for patient.. Thin and well-hydrated in no acute distress. Eyes conjunctiva clear no eyelid edema noted. pupils equal round and reactive to light and accommodation. Ears, Nose, Mouth, and Throat no gross abnormality of ear auricles or external auditory canals. patient has hearing loss. mucus membranes moist. Respiratory normal breathing without difficulty. clear to auscultation bilaterally. Cardiovascular regular rate and rhythm with normal S1, S2. 1+ dorsalis pedis/posterior tibialis pulses. trace pitting edema of the bilateral lower extremities. Gastrointestinal (GI) soft, non-tender, non-distended, +BS. no ventral hernia noted. Musculoskeletal Patient unable to walk without assistance. Psychiatric this patient is able to make decisions and demonstrates good insight into disease process. Alert and Oriented x 3. pleasant and cooperative. Notes Patient has two areas on the left in the region that appear to be superficial abrasions at this point. He also has a small region over the left trochanteric bursa location  which is slough covered-but does not appear to be infected. This did require sharp debridement which he tolerated without complication with good effect. Electronic Signature(s) Signed: 03/17/2017 7:35:55 PM By: Worthy Keeler PA-C Entered By: Worthy Keeler on 03/17/2017 19:16:36 [place], Leanor Kail ([id number]) -------------------------------------------------------------------------------- Physician Orders Details Patient Name: KOUROSH, JABLONSKY. Date of Service: 03/17/2017 8:00 AM Medical Record Number: [id number] Patient Account Number: [id number] Date of Birth/Sex: 05-14-1931 (83 y.o. Male) Treating RN: Cornell Barman Primary Care Provider: Ria Bush Other Clinician: Referring Provider: Howard Pouch Treating Provider/Extender: STONE III, HOYT Weeks in Treatment: 0 Verbal / Phone Orders: No Diagnosis Coding Wound Cleansing Wound #1 Left Ischium o Clean wound with Normal Saline. o Cleanse wound with mild soap and water Wound #2 Left,Midline,Anterior Knee o Clean wound with Normal Saline. o Cleanse wound with mild soap and water Wound #3 Left,Medial Knee o Clean wound with Normal Saline. o Cleanse wound with mild soap and water Anesthetic Wound #1 Left Ischium o Topical Lidocaine 4% cream applied to wound bed prior to debridement Wound #2 Left,Midline,Anterior Knee o Topical Lidocaine 4% cream applied to wound bed prior to debridement Wound #3 Left,Medial Knee o Topical Lidocaine 4% cream applied to wound bed prior to debridement Skin Barriers/Peri-Wound Care Wound #1 Left Ischium o Skin Prep Primary Wound Dressing Wound #1 Left Ischium o Santyl Ointment - Manuka Honey if Santyl too expensive Wound #2 Left,Midline,Anterior Knee o Other: - Bactroban Wound #3 Left,Medial Knee o Other: - Bactroban Hao, Adonias R. ([id number]) Secondary Dressing Wound #1 Left Ischium o Boardered Foam Dressing Dressing Change Frequency Wound #1 Left  Ischium o Change dressing every day. Wound #2 Left,Midline,Anterior Knee o Change dressing every day. Wound #3 Left,Medial Knee o Change dressing every day. Follow-up Appointments Wound #1 Left Ischium o Return Appointment in 1 week. Wound #2 Left,Midline,Anterior Knee o Return Appointment in 1 week. Wound #3 Left,Medial Knee o Return Appointment in 1 week. Off-Loading Wound #1 Left Ischium o Turn and reposition every 2 hours Additional Orders / Instructions Wound #1 Left Ischium o Increase protein intake. Wound #2 Left,Midline,Anterior Knee o Increase protein intake. Wound #3 Left,Medial Knee o Increase protein intake. Patient Medications Allergies: penicillin, Sulfa (Sulfonamide Antibiotics), Aricept, nizatidine, Lipitor Notifications Medication Indication Start End Bactroban 03/17/2017 DOSE  topical 2 % cream - cream topical apply 2 times a day for 14 days to affected area [place], [place]. ([id number]) Notes I am going to recommend mupirocin ointment for the knee wounds which appear to be superficial and likely slightly intact. I think this would be better for him than current therapy. I'm also going to recommend Medihoney for the left trochanteric bursa location as the Santyl was too expensive. We will see were things stand in one week. If anything worsens in the interim he will contact our office or rather his wife will for additional recommendations. Electronic Signature(s) Signed: 03/17/2017 7:35:55 PM By: Worthy Keeler PA-C Previous Signature: 03/17/2017 9:39:35 AM Version By: Worthy Keeler PA-C Entered By: Worthy Keeler on 03/17/2017 19:17:35 [place], Leanor Kail ([id number]) -------------------------------------------------------------------------------- Problem List Details Patient Name: FELICE, DEEM. Date of Service: 03/17/2017 8:00 AM Medical Record Number: [id number] Patient Account Number: [id number] Date of Birth/Sex: 1931/06/24 (83  y.o. Male) Treating RN: Cornell Barman Primary Care Provider: Ria Bush Other Clinician: Referring Provider: Howard Pouch Treating Provider/Extender: Melburn Hake, HOYT Weeks in Treatment: 0 Active Problems ICD-10 Encounter Code Description Active Date Diagnosis S80.212A Abrasion, left knee, initial encounter 03/17/2017 Yes S71.002A Unspecified open wound, left hip, initial encounter 03/17/2017 Yes N18.3 Chronic kidney disease, stage 3 (moderate) 03/17/2017 Yes Z86.73 Personal history of transient ischemic attack (TIA), and 03/17/2017 Yes cerebral infarction without residual deficits Inactive Problems Resolved Problems Electronic Signature(s) Signed: 03/17/2017 7:35:55 PM By: Worthy Keeler PA-C Entered By: Worthy Keeler on 03/17/2017 19:05:39 [place], Leanor Kail ([id number]) -------------------------------------------------------------------------------- Progress Note Details Patient Name: [name]. Date of Service: 03/17/2017 8:00 AM Medical Record Number: [id number] Patient Account Number: [id number] Date of Birth/Sex: 11/15/1930 (83 y.o. Male) Treating RN: Cornell Barman Primary Care Provider: Ria Bush Other Clinician: Referring Provider: Howard Pouch Treating Provider/Extender: Melburn Hake, HOYT Weeks in Treatment: 0 Subjective Chief Complaint Information obtained from Patient Left lower leg ulcers History of Present Illness (HPI) 03/17/17 on evaluation today patient presents with wounds of the left lower extremity which have been present for about three weeks. His wife is a retired Designer, jewellery and takes care of him. Currently they have been using bag balm but otherwise no other current therapy. She feels like that the wounds of the knee region appear to be doing somewhat better unfortunately she was having issues getting these wounds to fully healed. Patient fortunately does not have a significant amount of pain although he does have some discomfort.  He said previous hip replacements as well as the first injection this was several months back prior to when the wounds appeared and I do not feel it is related. Fortunately there's no evidence of significant infection and he does not go outside so these are not likely insect bites least not tick bites. Patient does have a history of DVT and pulmonary embolism in the 1990s as well as the DVT and left lower extremity in 2017. She had set in 2015, a stroke in 2017, and stage III kidney disease. He does angulate using a walker. Wound History Patient reportedly has not tested positive for osteomyelitis. Patient reportedly has not had testing performed to evaluate circulation in the legs. Patient History Information obtained from Patient, Caregiver, Chart. Allergies penicillin, Sulfa (Sulfonamide Antibiotics), Aricept, nizatidine, Lipitor Family History Cancer - Siblings, Diabetes - Siblings, Heart Disease - Mother, Hypertension - Mother, Siblings, Stroke - Mother, No family history of Kidney Disease, Lung Disease, Seizures, Thyroid Problems, Tuberculosis. Social History Never smoker,  Marital Status - Married, Alcohol Use - Never, Drug Use - No History, Caffeine Use - Never. Medical History JOSEDEJESUS, MARCUM ([id number]) Eyes Patient has history of Cataracts - bilateral removed Denies history of Glaucoma, Optic Neuritis Ear/Nose/Mouth/Throat Denies history of Chronic sinus problems/congestion, Middle ear problems Hematologic/Lymphatic Patient has history of Anemia Denies history of Hemophilia, Human Immunodeficiency Virus, Lymphedema, Sickle Cell Disease Respiratory Patient has history of Asthma - No recent flair-ups Denies history of Aspiration, Chronic Obstructive Pulmonary Disease (COPD), Pneumothorax, Sleep Apnea, Tuberculosis Cardiovascular Patient has history of Deep Vein Thrombosis - Left leg, Hypotension Denies history of Angina, Arrhythmia, Congestive Heart Failure, Coronary  Artery Disease, Hypertension, Myocardial Infarction, Peripheral Arterial Disease, Peripheral Venous Disease, Phlebitis, Vasculitis Gastrointestinal Denies history of Cirrhosis , Colitis, Crohn s, Hepatitis A, Hepatitis B, Hepatitis C Endocrine Denies history of Type I Diabetes, Type II Diabetes Genitourinary Denies history of End Stage Renal Disease Immunological Denies history of Lupus Erythematosus, Raynaud s, Scleroderma Integumentary (Skin) Denies history of History of Burn, History of pressure wounds Musculoskeletal Denies history of Gout, Rheumatoid Arthritis, Osteoarthritis, Osteomyelitis Neurologic Denies history of Dementia, Neuropathy, Quadriplegia, Paraplegia, Seizure Disorder Oncologic Denies history of Received Chemotherapy, Received Radiation Psychiatric Denies history of Anorexia/bulimia, Confinement Anxiety Hospitalization/Surgery History - 09/09/2015, [facility] Notes Constitutional Symptoms (General Health) Memory deficit, 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; Sepsis 05/2014; History of Falls; C-7 fracture 2016; Stroke 11/2015; Hip Fracture; DVT Left 2017; High Cholesterol; Grade III Kidney disease; Benign tremor Genitourinary Grade III Kidney disease Review of Systems (ROS) Constitutional Symptoms (General Health) The patient has no complaints or symptoms. Eyes Complains or has symptoms of Dry Eyes, Glasses / Contacts. Denies complaints or symptoms of Vision Changes. Ear/Nose/Mouth/Throat [place], Leanor Kail ([id number]) The patient has no complaints or symptoms. Hematologic/Lymphatic The patient has no complaints or symptoms. Respiratory The patient has no complaints or symptoms. Cardiovascular Complains or has symptoms of LE edema - 1+. Denies complaints or symptoms of Chest pain. Gastrointestinal The patient has no complaints or symptoms. Endocrine The patient has no complaints or symptoms. Genitourinary The patient has no complaints or  symptoms. Immunological The patient has no complaints or symptoms. Integumentary (Skin) Complains or has symptoms of Wounds, Bleeding or bruising tendency. Denies complaints or symptoms of Breakdown, Swelling. Musculoskeletal The patient has no complaints or symptoms. Neurologic The patient has no complaints or symptoms. Oncologic The patient has no complaints or symptoms. Psychiatric Complains or has symptoms of Anxiety. Denies complaints or symptoms of Claustrophobia. Objective Constitutional sitting or standing blood pressure is within target range for patient.. pulse regular and within target range for patient.Marland Kitchen respirations regular, non-labored and within target range for patient.Marland Kitchen temperature within target range for patient.. Thin and well-hydrated in no acute distress. Vitals Time Taken: 8:17 AM, Height: 66 in, Weight: 170 lbs, BMI: 27.4, Temperature: 98.2 F, Pulse: 81 bpm, Respiratory Rate: 16 breaths/min, Blood Pressure: 110/58 mmHg. Eyes conjunctiva clear no eyelid edema noted. pupils equal round and reactive to light and accommodation. [place] ([id number]) Ears, Nose, Mouth, and Throat no gross abnormality of ear auricles or external auditory canals. patient has hearing loss. mucus membranes moist. Respiratory normal breathing without difficulty. clear to auscultation bilaterally. Cardiovascular regular rate and rhythm with normal S1, S2. 1+ dorsalis pedis/posterior tibialis pulses. trace pitting edema of the bilateral lower extremities. Gastrointestinal (GI) soft, non-tender, non-distended, +BS. no ventral hernia noted. Musculoskeletal Patient unable to walk without assistance. Psychiatric this patient is able to make decisions and demonstrates good insight into disease  process. Alert and Oriented x 3. pleasant and cooperative. General Notes: Patient has two areas on the left in the region that appear to be superficial abrasions at this point. He also  has a small region over the left trochanteric bursa location which is slough covered-but does not appear to be infected. This did require sharp debridement which he tolerated without complication with good effect. Integumentary (Hair, Skin) Wound #1 status is Open. Original cause of wound was Gradually Appeared. The wound is located on the Left Ischium. The wound measures 0.6cm length x 0.3cm width x 0.1cm depth; 0.141cm^2 area and 0.014cm^3 volume. There is no tunneling or undermining noted. There is a none present amount of drainage noted. The wound margin is flat and intact. There is no granulation within the wound bed. There is a large (67-100%) amount of necrotic tissue within the wound bed including Adherent Slough. Wound #2 status is Open. Original cause of wound was Gradually Appeared. The wound is located on the Left,Midline,Anterior Knee. The wound measures 4.5cm length x 4cm width x 0.1cm depth; 14.137cm^2 area and 1.414cm^3 volume. There is no tunneling or undermining noted. There is a small amount of drainage noted. The wound margin is indistinct and nonvisible. There is no granulation within the wound bed. There is no necrotic tissue within the wound bed. The periwound skin appearance exhibited: Excoriation. Wound #3 status is Open. Original cause of wound was Gradually Appeared. The wound is located on the Left,Medial Knee. The wound measures 7cm length x 2cm width x 0.1cm depth; 10.996cm^2 area and 1.1cm^3 volume. There is no tunneling or undermining noted. There is a none present amount of drainage noted. The wound margin is indistinct and nonvisible. There is no granulation within the wound bed. There is no necrotic tissue within the wound bed. The periwound skin appearance exhibited: Excoriation. EMRY, BARBATO ([id number]) Assessment Active Problems ICD-10 S80.212A - Abrasion, left knee, initial encounter S71.002A - Unspecified open wound, left hip, initial  encounter N18.3 - Chronic kidney disease, stage 3 (moderate) Z86.73 - Personal history of transient ischemic attack (TIA), and cerebral infarction without residual deficits Procedures Wound #1 Pre-procedure diagnosis of Wound #1 is a To be determined located on the Left Ischium . There was a Skin/Subcutaneous Tissue Debridement ([id number]) debridement with total area of 0.18 sq cm performed by STONE III, HOYT E., PA-C. with the following instrument(s): Curette to remove Viable and Non-Viable tissue/material including Fibrin/Slough and Subcutaneous after achieving pain control using Other (lidocaine 4%). A time out was conducted at 09:20, prior to the start of the procedure. A Minimum amount of bleeding was controlled with Pressure. The procedure was tolerated well with a pain level of 0 throughout and a pain level of 0 following the procedure. Post Debridement Measurements: 0.6cm length x 0.3cm width x 0.1cm depth; 0.014cm^3 volume. Character of Wound/Ulcer Post Debridement requires further debridement. Post procedure Diagnosis Wound #1: Same as Pre-Procedure Plan Wound Cleansing: Wound #1 Left Ischium: Clean wound with Normal Saline. Cleanse wound with mild soap and water Wound #2 Left,Midline,Anterior Knee: Clean wound with Normal Saline. Cleanse wound with mild soap and water Wound #3 Left,Medial Knee: Clean wound with Normal Saline. Cleanse wound with mild soap and water Anesthetic: Wound #1 Left Ischium: Topical Lidocaine 4% cream applied to wound bed prior to debridement Almanza, Karlton R. ([id number]) Wound #2 Left,Midline,Anterior Knee: Topical Lidocaine 4% cream applied to wound bed prior to debridement Wound #3 Left,Medial Knee: Topical Lidocaine 4% cream applied to wound bed prior to  debridement Skin Barriers/Peri-Wound Care: Wound #1 Left Ischium: Skin Prep Primary Wound Dressing: Wound #1 Left Ischium: Santyl Ointment - Manuka Honey if Santyl too expensive Wound  #2 Left,Midline,Anterior Knee: Other: - Bactroban Wound #3 Left,Medial Knee: Other: - Bactroban Secondary Dressing: Wound #1 Left Ischium: Boardered Foam Dressing Dressing Change Frequency: Wound #1 Left Ischium: Change dressing every day. Wound #2 Left,Midline,Anterior Knee: Change dressing every day. Wound #3 Left,Medial Knee: Change dressing every day. Follow-up Appointments: Wound #1 Left Ischium: Return Appointment in 1 week. Wound #2 Left,Midline,Anterior Knee: Return Appointment in 1 week. Wound #3 Left,Medial Knee: Return Appointment in 1 week. Off-Loading: Wound #1 Left Ischium: Turn and reposition every 2 hours Additional Orders / Instructions: Wound #1 Left Ischium: Increase protein intake. Wound #2 Left,Midline,Anterior Knee: Increase protein intake. Wound #3 Left,Medial Knee: Increase protein intake. The following medication(s) was prescribed: Bactroban topical 2 % cream cream topical apply 2 times a day for 14 days to affected area starting 03/17/2017 General Notes: I am going to recommend mupirocin ointment for the knee wounds which appear to be superficial and likely slightly intact. I think this would be better for him than current therapy. I'm also going to recommend Medihoney for the left trochanteric bursa location as the Santyl was too expensive. We will see were things stand in one week. If anything worsens in the interim he will contact our office or rather his wife will for additional recommendations. DAQWAN, DOUGAL ([id number]) Electronic Signature(s) Signed: 03/17/2017 7:35:55 PM By: Worthy Keeler PA-C Entered By: Worthy Keeler on 03/17/2017 19:17:46 [place], Leanor Kail ([id number]) -------------------------------------------------------------------------------- ROS/PFSH Details Patient Name: [name]. Date of Service: 03/17/2017 8:00 AM Medical Record Number: [id number] Patient Account Number: [id number] Date of Birth/Sex: 09-29-1930  (83 y.o. Male) Treating RN: Cornell Barman Primary Care Provider: Ria Bush Other Clinician: Referring Provider: Howard Pouch Treating Provider/Extender: Melburn Hake, HOYT Weeks in Treatment: 0 Information Obtained From Patient Caregiver Chart Wound History Do you currently have one or more open woundso Yes Approximately how long have you had your woundso 3 weeks How have you been treating your wound(s) until nowo bag balm Has your wound(s) ever healed and then re-openedo No Have you had any lab work done in the past montho Yes Have you tested positive for an antibiotic resistant organism (MRSA, VRE)o Yes Date: 09/09/2011 Have you tested positive for osteomyelitis (bone infection)o No Have you had any tests for circulation on your legso No Eyes Complaints and Symptoms: Positive for: Dry Eyes; Glasses / Contacts Negative for: Vision Changes Medical History: Positive for: Cataracts - bilateral removed Negative for: Glaucoma; Optic Neuritis Cardiovascular Complaints and Symptoms: Positive for: LE edema - 1+ Negative for: Chest pain Medical History: Positive for: Deep Vein Thrombosis - Left leg; Hypotension Negative for: Angina; Arrhythmia; Congestive Heart Failure; Coronary Artery Disease; Hypertension; Myocardial Infarction; Peripheral Arterial Disease; Peripheral Venous Disease; Phlebitis; Vasculitis Integumentary (Skin) Complaints and Symptoms: Positive for: Wounds; Bleeding or bruising tendency Negative for: Breakdown; Swelling Medical History: DRESHAWN, HENDERSHOTT ([id number]) Negative for: History of Burn; History of pressure wounds Psychiatric Complaints and Symptoms: Positive for: Anxiety Negative for: Claustrophobia Medical History: Negative for: Anorexia/bulimia; Confinement Anxiety Constitutional Symptoms (General Health) Complaints and Symptoms: No Complaints or Symptoms Medical History: Past Medical History Notes: Memory deficit, 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; Sepsis  05/2014; History of Falls; C-7 fracture 2016; Stroke 11/2015; Hip Fracture; DVT Left 2017; High Cholesterol; Grade III Kidney disease; Benign tremor Ear/Nose/Mouth/Throat Complaints and Symptoms: No Complaints or Symptoms Medical History:  Negative for: Chronic sinus problems/congestion; Middle ear problems Hematologic/Lymphatic Complaints and Symptoms: No Complaints or Symptoms Medical History: Positive for: Anemia Negative for: Hemophilia; Human Immunodeficiency Virus; Lymphedema; Sickle Cell Disease Respiratory Complaints and Symptoms: No Complaints or Symptoms Medical History: Positive for: Asthma - No recent flair-ups Negative for: Aspiration; Chronic Obstructive Pulmonary Disease (COPD); Pneumothorax; Sleep Apnea; Tuberculosis Gastrointestinal Complaints and Symptoms: No Complaints or Symptoms AMON, COSTILLA ([id number]) Medical History: Negative for: Cirrhosis ; Colitis; Crohnos; Hepatitis A; Hepatitis B; Hepatitis C Endocrine Complaints and Symptoms: No Complaints or Symptoms Medical History: Negative for: Type I Diabetes; Type II Diabetes Genitourinary Complaints and Symptoms: No Complaints or Symptoms Medical History: Negative for: End Stage Renal Disease Past Medical History Notes: Grade III Kidney disease Immunological Complaints and Symptoms: No Complaints or Symptoms Medical History: Negative for: Lupus Erythematosus; Raynaudos; Scleroderma Musculoskeletal Complaints and Symptoms: No Complaints or Symptoms Medical History: Negative for: Gout; Rheumatoid Arthritis; Osteoarthritis; Osteomyelitis Neurologic Complaints and Symptoms: No Complaints or Symptoms Medical History: Negative for: Dementia; Neuropathy; Quadriplegia; Paraplegia; Seizure Disorder Oncologic Complaints and Symptoms: No Complaints or Symptoms Medical History: ELFEGO, GIAMMARINO ([id number]) Negative for: Received Chemotherapy; Received Radiation HBO Extended History  Items Eyes: Cataracts Immunizations Pneumococcal Vaccine: Received Pneumococcal Vaccination: Yes Tetanus Vaccine: Last tetanus shot: 09/08/2009 Hospitalization / Surgery History Name of Hospital Purpose of Hospitalization/Surgery Date [facility] 09/09/2015 Family and Social History Cancer: Yes - Siblings; Diabetes: Yes - Siblings; Heart Disease: Yes - Mother; Hypertension: Yes - Mother, Siblings; Kidney Disease: No; Lung Disease: No; Seizures: No; Stroke: Yes - Mother; Thyroid Problems: No; Tuberculosis: No; Never smoker; Marital Status - Married; Alcohol Use: Never; Drug Use: No History; Caffeine Use: Never; Advanced Directives: Yes (Not Provided); Patient does not want information on Advanced Directives; Do not resuscitate: No; Living Will: Yes (Copy provided); Medical Power of Attorney: Yes (Not Provided) Electronic Signature(s) Signed: 03/17/2017 5:36:29 PM By: Gretta Cool, BSN, RN, CWS, Kim RN, BSN Signed: 03/17/2017 7:35:55 PM By: Worthy Keeler PA-C Entered By: Gretta Cool, BSN, RN, CWS, Kim on 03/17/2017 08:53:36 [place], Leanor Kail ([id number]) -------------------------------------------------------------------------------- SuperBill Details Patient Name: KAILER, HEINDEL. Date of Service: 03/17/2017 Medical Record Number: [id number] Patient Account Number: [id number] Date of Birth/Sex: 1931/05/14 (83 y.o. Male) Treating RN: Cornell Barman Primary Care Provider: Ria Bush Other Clinician: Referring Provider: Howard Pouch Treating Provider/Extender: Melburn Hake, HOYT Weeks in Treatment: 0 Diagnosis Coding ICD-10 Codes Code Description S80.212A Abrasion, left knee, initial encounter S71.002A Unspecified open wound, left hip, initial encounter N18.3 Chronic kidney disease, stage 3 (moderate) Personal history of transient ischemic attack (TIA), and cerebral infarction without residual Z86.73 deficits Facility Procedures CPT4 Code: [id number] Description: Arab VISIT-LEV 3 EST  PT Modifier: Quantity: 1 CPT4 Code: [id number] Description: 11042 - DEB SUBQ TISSUE 20 SQ CM/< ICD-10 Description Diagnosis S71.002A Unspecified open wound, left hip, initial encount Modifier: er Quantity: 1 Physician Procedures CPT4: Description Modifier Quantity Code [id number] WC PHYS LEVEL 3 o NEW PT 25 1 ICD-10 Description Diagnosis S80.212A Abrasion, left knee, initial encounter S71.002A Unspecified open wound, left hip, initial encounter N18.3 Chronic kidney disease, stage 3  (moderate) Z86.73 Personal history of transient ischemic attack (TIA), and cerebral infarction without residual deficits CPT4: 5885027 74128 - WC PHYS SUBQ TISS 20 SQ CM 1 ICD-10 Description Diagnosis S71.002A Unspecified open wound, left hip, initial encounter MICKEY, ESGUERRA ([id number]) Electronic Signature(s) Signed: 03/17/2017 7:35:55 PM By: Worthy Keeler PA-C Entered By: Worthy Keeler on 03/17/2017 19:18:20

## 2017-03-18 NOTE — Progress Notes (Addendum)
Number: [id number] Date of Birth/Sex: [birth year] (83 y.o. Male) Treating RN: Cornell Barman Primary Care Cheria Sadiq: Ria Bush Other Clinician: Referring Nasha Diss: Ria Bush Treating Julianna Vanwagner/Extender: STONE III, HOYT Weeks in Treatment: 0 Wound Status Wound Number: 1 Primary To be determined Etiology: Wound Location: Left Ischium Wound  Open Wounding Event: Gradually Appeared Status: Date Acquired: 02/27/2017 Comorbid Cataracts, Anemia, Asthma, Deep Weeks Of Treatment: 0 History: Vein Thrombosis, Hypotension Clustered Wound: No Photos Photo Uploaded By: Gretta Cool, BSN, RN, CWS, Kim on 03/17/2017 09:01:10 Wound Measurements Length: (cm) 0.6 Width: (cm) 0.3 Depth: (cm) 0.1 Area: (cm) 0.141 Volume: (cm) 0.014 % Reduction in Area: % Reduction in Volume: Epithelialization: None Tunneling: No Undermining: No Wound Description Classification: Unclassifiable Wound Margin: Flat and Intact Exudate Amount: None Present Foul Odor After Cleansing: No Slough/Fibrino Yes Wound Bed Granulation Amount: None Present (0%) Exposed Structure Necrotic Amount: Large (67-100%) Fascia Exposed: No Necrotic Quality: Adherent Slough Fat Layer (Subcutaneous Tissue) Exposed: No Tendon Exposed: No Muscle Exposed: No Joint Exposed: No Bone Exposed: No Periwound Skin Texture Frisk, Daysen R. ([id number]) Texture Color No Abnormalities Noted: No No Abnormalities Noted: No Moisture No Abnormalities Noted: No Wound Preparation Ulcer Cleansing: Rinsed/Irrigated with Saline Topical Anesthetic Applied: Other: liocaine 4%, Electronic Signature(s) Signed: 03/17/2017 5:36:29 PM By: Gretta Cool, BSN, RN, CWS, Kim RN, BSN Entered By: Gretta Cool, BSN, RN, CWS, Kim on 03/17/2017 08:47:19 Benjamin, Leanor Kail ([id number]) -------------------------------------------------------------------------------- Wound Assessment Details Patient Name: [name], LINCH. Date of Service: 03/17/2017 8:00 AM Medical Record Number: [id number] Patient Account Number: [id number] Date of Birth/Sex: 10-22-1930 (83 y.o. Male) Treating RN: Cornell Barman Primary Care Enrico Eaddy: Ria Bush Other Clinician: Referring Hollis Tuller: Ria Bush Treating Jaydence Vanyo/Extender: STONE III, HOYT Weeks in Treatment: 0 Wound Status Wound Number: 2 Primary  Abrasion Etiology: Wound Location: Left Knee - Medial Wound Open Wounding Event: Gradually Appeared Status: Date Acquired: 03/02/2017 Comorbid Cataracts, Anemia, Asthma, Deep Weeks Of Treatment: 0 History: Vein Thrombosis, Hypotension Clustered Wound: Yes Photos Photo Uploaded By: Gretta Cool, BSN, RN, CWS, Kim on 03/17/2017 09:01:24 Wound Measurements Length: (cm) 4.5 Width: (cm) 4 Depth: (cm) 0.1 Clustered Quantity: 3 Area: (cm) 14.137 Volume: (cm) 1.414 % Reduction in Area: % Reduction in Volume: Epithelialization: Medium (34-66%) Tunneling: No Undermining: No Wound Description Classification: Partial Thickness Wound Margin: Indistinct, nonvisible Exudate Amount: Small Foul Odor After Cleansing: No Slough/Fibrino No Wound Bed Granulation Amount: None Present (0%) Exposed Structure Necrotic Amount: None Present (0%) Fascia Exposed: No Fat Layer (Subcutaneous Tissue) Exposed: No Tendon Exposed: No Muscle Exposed: No Joint Exposed: No Bone Exposed: No Altizer, Harvis R. ([id number]) Periwound Skin Texture Texture Color No Abnormalities Noted: No No Abnormalities Noted: No Excoriation: Yes Moisture No Abnormalities Noted: No Wound Preparation Ulcer Cleansing: Rinsed/Irrigated with Saline Topical Anesthetic Applied: None Electronic Signature(s) Signed: 03/17/2017 5:36:29 PM By: Gretta Cool, BSN, RN, CWS, Kim RN, BSN Entered By: Gretta Cool, BSN, RN, CWS, Kim on 03/17/2017 08:49:06 [place], Leanor Kail ([id number]) -------------------------------------------------------------------------------- Wound Assessment Details Patient Name: [name], MOSKAL. Date of Service: 03/17/2017 8:00 AM Medical Record Number: [id number] Patient Account Number: [id number] Date of Birth/Sex: 1931-01-31 (83 y.o. Male) Treating RN: Cornell Barman Primary Care Monesha Monreal: Ria Bush Other Clinician: Referring Ninoshka Wainwright: Ria Bush Treating Emika Tiano/Extender: STONE III, HOYT Weeks in  Treatment: 0 Wound Status Wound Number: 3 Primary Abrasion Etiology: Wound Location: Left Knee - Medial Wound Open Wounding Event: Gradually Appeared Status: Date Acquired: 03/02/2017 Comorbid Cataracts, Anemia, Asthma, Deep Weeks Of Treatment: 0 History: Vein Thrombosis, Hypotension Clustered Wound: Yes Photos Photo Uploaded By: Gretta Cool, BSN, RN, CWS,  x W x D 0.6x0.3x0.1 4.5x4x0.1 7x2x0.1 (cm) Area (cm) : 0.141 14.137 10.996 Volume (cm) : 0.014 1.414 1.1 Classification: Unclassifiable Partial Thickness Partial Thickness Exudate Amount: None Present Small None Present Wound Margin: Flat and Intact Indistinct, nonvisible Indistinct, nonvisible Granulation Amount: None Present (0%) None Present (0%) None Present (0%) Necrotic Amount: Large (67-100%) None Present (0%) None Present (0%) Exposed Structures: Fascia: No Fascia: No Fascia: No Fat Layer (Subcutaneous Fat Layer (Subcutaneous Fat Layer (Subcutaneous Tissue) Exposed: No Tissue) Exposed: No Tissue) Exposed: No Tendon: No Tendon: No Tendon: No Quinney, Cleto R. ([id number]) Muscle: No Muscle: No Muscle: No Joint: No Joint: No Joint: No Bone: No Bone: No Bone: No Epithelialization: None Medium (34-66%) Medium (34-66%) Periwound Skin Texture: No Abnormalities Noted Excoriation: Yes Excoriation: Yes Periwound Skin No Abnormalities Noted No Abnormalities Noted No Abnormalities Noted Moisture: Periwound Skin Color: No Abnormalities Noted No Abnormalities Noted No Abnormalities Noted Tenderness on No No No Palpation: Wound  Preparation: Ulcer Cleansing: Ulcer Cleansing: Ulcer Cleansing: Rinsed/Irrigated with Rinsed/Irrigated with Rinsed/Irrigated with Saline Saline Saline Topical Anesthetic Topical Anesthetic Topical Anesthetic Applied: Other: liocaine Applied: None Applied: None 4% Treatment Notes Electronic Signature(s) Signed: 03/17/2017 5:36:29 PM By: Gretta Cool, BSN, RN, CWS, Kim RN, BSN Entered By: Gretta Cool, BSN, RN, CWS, Kim on 03/17/2017 09:20:20 [place], Leanor Kail ([id number]) -------------------------------------------------------------------------------- Multi-Disciplinary Care Plan Details Patient Name: [name], ACKERT. Date of Service: 03/17/2017 8:00 AM Medical Record Number: [id number] Patient Account Number: [id number] Date of Birth/Sex: 29-Oct-1930 (83 y.o. Male) Treating RN: Cornell Barman Primary Care Zareah Hunzeker: Ria Bush Other Clinician: Referring Kamaal Cast: Ria Bush Treating Avrom Robarts/Extender: Melburn Hake, HOYT Weeks in Treatment: 0 Active Inactive Electronic Signature(s) Signed: 04/07/2017 7:59:22 AM By: Gretta Cool, BSN, RN, CWS, Kim RN, BSN Previous Signature: 03/17/2017 5:36:29 PM Version By: Gretta Cool, BSN, RN, CWS, Kim RN, BSN Entered By: Gretta Cool, BSN, RN, CWS, Kim on 03/31/2017 10:02:45 [place], Leanor Kail ([id number]) -------------------------------------------------------------------------------- Pain Assessment Details Patient Name: [name], CAMPIONE. Date of Service: 03/17/2017 8:00 AM Medical Record Number: [id number] Patient Account Number: [id number] Date of Birth/Sex: 09/07/1931 (83 y.o. Male) Treating RN: Cornell Barman Primary Care Godric Lavell: Ria Bush Other Clinician: Referring Quinlee Sciarra: Ria Bush Treating Shemekia Patane/Extender: STONE III, HOYT Weeks in Treatment: 0 Active Problems Location of Pain Severity and Description of Pain Patient Has Paino No Site Locations With Dressing Change: Yes Rate the pain. Current Pain Level: 3 Character of Pain Describe the  Pain: Tender Pain Management and Medication Current Pain Management: Electronic Signature(s) Signed: 03/17/2017 5:36:29 PM By: Gretta Cool, BSN, RN, CWS, Kim RN, BSN Entered By: Gretta Cool, BSN, RN, CWS, Kim on 03/17/2017 08:17:51 [place], Leanor Kail ([id number]) -------------------------------------------------------------------------------- Patient/Caregiver Education Details Patient Name: [name] Date of Service: 03/17/2017 8:00 AM Medical Record Number: [id number] Patient Account Number: [id number] Date of Birth/Gender: April 24, 1931 (83 y.o. Male) Treating RN: Cornell Barman Primary Care Physician: Ria Bush Other Clinician: Referring Physician: Ria Bush Treating Physician/Extender: Sharalyn Ink in Treatment: 0 Education Assessment Education Provided To: Patient Education Topics Provided Welcome To [facility]: Handouts: Welcome To [facility] Methods: Demonstration, Explain/Verbal Responses: State content correctly Wound/Skin Impairment: Handouts: Caring for Your Ulcer, Other: daily dressing changes as prescribed Methods: Demonstration, Explain/Verbal Responses: State content correctly Electronic Signature(s) Signed: 03/17/2017 5:36:29 PM By: Gretta Cool, BSN, RN, CWS, Kim RN, BSN Entered By: Gretta Cool, BSN, RN, CWS, Kim on 03/17/2017 09:33:18 [place], Leanor Kail ([id number]) -------------------------------------------------------------------------------- Wound Assessment Details Patient Name: [name], TESCHNER. Date of Service: 03/17/2017 8:00 AM Medical Record Number: [id number] Patient Account  ERIE, RADU ([id number]) Visit Report for 03/17/2017 Allergy List Details Patient Name: THADD, APUZZO. Date of Service: 03/17/2017 8:00 AM Medical Record Number: [id number] Patient Account Number: [id number] Date of Birth/Sex: 1931/04/26 (83 y.o. Male) Treating RN: Cornell Barman Primary Care Ivry Pigue: Ria Bush Other Clinician: Referring Kengo Sturges: Ria Bush Treating Mikki Ziff/Extender: Melburn Hake, HOYT Weeks in Treatment: 0 Allergies Active Allergies penicillin Sulfa (Sulfonamide Antibiotics) Aricept nizatidine Lipitor Allergy Notes Electronic Signature(s) Signed: 03/17/2017 5:36:29 PM By: Gretta Cool, BSN, RN, CWS, Kim RN, BSN Entered By: Gretta Cool, BSN, RN, CWS, Kim on 03/17/2017 08:22:01 [place], Leanor Kail ([id number]) -------------------------------------------------------------------------------- Arrival Information Details Patient Name: [name], HOUSEY. Date of Service: 03/17/2017 8:00 AM Medical Record Number: [id number] Patient Account Number: [id number] Date of Birth/Sex: 21-Dec-1930 (83 y.o. Male) Treating RN: Cornell Barman Primary Care Mathieu Schloemer: Ria Bush Other Clinician: Referring Corita Allinson: Ria Bush Treating Keonia Pasko/Extender: Melburn Hake, HOYT Weeks in Treatment: 0 Visit Information Patient Arrived: Walker Arrival Time: 08:16 Accompanied By: wife Transfer Assistance: Manual Patient Identification Verified: Yes Secondary Verification Process Yes Completed: Patient Has Alerts: Yes Patient Alerts: Patient on Blood Thinner warfarin NOT DIABETIC Electronic Signature(s) Signed: 03/17/2017 5:36:29 PM By: Gretta Cool, BSN, RN, CWS, Kim RN, BSN Entered By: Gretta Cool, BSN, RN, CWS, Kim on 03/17/2017 08:17:08 [place], Leanor Kail ([id number]) -------------------------------------------------------------------------------- Clinic Level of Care Assessment Details Patient Name: [name], SCALISI. Date of Service: 03/17/2017 8:00 AM Medical Record Number:  [id number] Patient Account Number: [id number] Date of Birth/Sex: [birth year] (83 y.o. Male) Treating RN: Cornell Barman Primary Care Asli Tokarski: Ria Bush Other Clinician: Referring Cabe Lashley: Ria Bush Treating Jonpaul Lumm/Extender: Melburn Hake, HOYT Weeks in Treatment: 0 Clinic Level of Care Assessment Items TOOL 2 Quantity Score []  - Use when only an EandM is performed on the INITIAL visit 0 ASSESSMENTS - Nursing Assessment / Reassessment []  - General Physical Exam (combine w/ comprehensive assessment (listed just 0 below) when performed on new pt. evals) X - Comprehensive Assessment (HX, ROS, Risk Assessments, Wounds Hx, etc.) 1 25 ASSESSMENTS - Wound and Skin Assessment / Reassessment X - Simple Wound Assessment / Reassessment - one wound 1 5 []  - Complex Wound Assessment / Reassessment - multiple wounds 0 []  - Dermatologic / Skin Assessment (not related to wound area) 0 ASSESSMENTS - Ostomy and/or Continence Assessment and Care []  - Incontinence Assessment and Management 0 []  - Ostomy Care Assessment and Management (repouching, etc.) 0 PROCESS - Coordination of Care X - Simple Patient / Family Education for ongoing care 1 15 []  - Complex (extensive) Patient / Family Education for ongoing care 0 X - Staff obtains Programmer, systems, Records, Test Results / Process Orders 1 10 []  - Staff telephones HHA, Nursing Homes / Clarify orders / etc 0 []  - Routine Transfer to another Facility (non-emergent condition) 0 []  - Routine Hospital Admission (non-emergent condition) 0 []  - New Admissions / Biomedical engineer / Ordering NPWT, Apligraf, etc. 0 []  - Emergency Hospital Admission (emergent condition) 0 X - Simple Discharge Coordination 1 10 Neuroth, Kelby R. ([id number]) []  - Complex (extensive) Discharge Coordination 0 PROCESS - Special Needs []  - Pediatric / Minor Patient Management 0 []  - Isolation Patient Management 0 []  - Hearing / Language / Visual special needs 0 []  -  Assessment of Community assistance (transportation, D/C planning, etc.) 0 []  - Additional assistance / Altered mentation 0 []  - Support Surface(s) Assessment (bed, cushion, seat, etc.) 0 INTERVENTIONS - Wound Cleansing / Measurement X - Wound Imaging (photographs - any number of wounds) 1 5 []  - Wound Tracing (instead  x W x D 0.6x0.3x0.1 4.5x4x0.1 7x2x0.1 (cm) Area (cm) : 0.141 14.137 10.996 Volume (cm) : 0.014 1.414 1.1 Classification: Unclassifiable Partial Thickness Partial Thickness Exudate Amount: None Present Small None Present Wound Margin: Flat and Intact Indistinct, nonvisible Indistinct, nonvisible Granulation Amount: None Present (0%) None Present (0%) None Present (0%) Necrotic Amount: Large (67-100%) None Present (0%) None Present (0%) Exposed Structures: Fascia: No Fascia: No Fascia: No Fat Layer (Subcutaneous Fat Layer (Subcutaneous Fat Layer (Subcutaneous Tissue) Exposed: No Tissue) Exposed: No Tissue) Exposed: No Tendon: No Tendon: No Tendon: No Quinney, Cleto R. ([id number]) Muscle: No Muscle: No Muscle: No Joint: No Joint: No Joint: No Bone: No Bone: No Bone: No Epithelialization: None Medium (34-66%) Medium (34-66%) Periwound Skin Texture: No Abnormalities Noted Excoriation: Yes Excoriation: Yes Periwound Skin No Abnormalities Noted No Abnormalities Noted No Abnormalities Noted Moisture: Periwound Skin Color: No Abnormalities Noted No Abnormalities Noted No Abnormalities Noted Tenderness on No No No Palpation: Wound  Preparation: Ulcer Cleansing: Ulcer Cleansing: Ulcer Cleansing: Rinsed/Irrigated with Rinsed/Irrigated with Rinsed/Irrigated with Saline Saline Saline Topical Anesthetic Topical Anesthetic Topical Anesthetic Applied: Other: liocaine Applied: None Applied: None 4% Treatment Notes Electronic Signature(s) Signed: 03/17/2017 5:36:29 PM By: Gretta Cool, BSN, RN, CWS, Kim RN, BSN Entered By: Gretta Cool, BSN, RN, CWS, Kim on 03/17/2017 09:20:20 [place], Leanor Kail ([id number]) -------------------------------------------------------------------------------- Multi-Disciplinary Care Plan Details Patient Name: [name], ACKERT. Date of Service: 03/17/2017 8:00 AM Medical Record Number: [id number] Patient Account Number: [id number] Date of Birth/Sex: 29-Oct-1930 (83 y.o. Male) Treating RN: Cornell Barman Primary Care Zareah Hunzeker: Ria Bush Other Clinician: Referring Kamaal Cast: Ria Bush Treating Avrom Robarts/Extender: Melburn Hake, HOYT Weeks in Treatment: 0 Active Inactive Electronic Signature(s) Signed: 04/07/2017 7:59:22 AM By: Gretta Cool, BSN, RN, CWS, Kim RN, BSN Previous Signature: 03/17/2017 5:36:29 PM Version By: Gretta Cool, BSN, RN, CWS, Kim RN, BSN Entered By: Gretta Cool, BSN, RN, CWS, Kim on 03/31/2017 10:02:45 [place], Leanor Kail ([id number]) -------------------------------------------------------------------------------- Pain Assessment Details Patient Name: [name], CAMPIONE. Date of Service: 03/17/2017 8:00 AM Medical Record Number: [id number] Patient Account Number: [id number] Date of Birth/Sex: 09/07/1931 (83 y.o. Male) Treating RN: Cornell Barman Primary Care Godric Lavell: Ria Bush Other Clinician: Referring Quinlee Sciarra: Ria Bush Treating Shemekia Patane/Extender: STONE III, HOYT Weeks in Treatment: 0 Active Problems Location of Pain Severity and Description of Pain Patient Has Paino No Site Locations With Dressing Change: Yes Rate the pain. Current Pain Level: 3 Character of Pain Describe the  Pain: Tender Pain Management and Medication Current Pain Management: Electronic Signature(s) Signed: 03/17/2017 5:36:29 PM By: Gretta Cool, BSN, RN, CWS, Kim RN, BSN Entered By: Gretta Cool, BSN, RN, CWS, Kim on 03/17/2017 08:17:51 [place], Leanor Kail ([id number]) -------------------------------------------------------------------------------- Patient/Caregiver Education Details Patient Name: [name] Date of Service: 03/17/2017 8:00 AM Medical Record Number: [id number] Patient Account Number: [id number] Date of Birth/Gender: April 24, 1931 (83 y.o. Male) Treating RN: Cornell Barman Primary Care Physician: Ria Bush Other Clinician: Referring Physician: Ria Bush Treating Physician/Extender: Sharalyn Ink in Treatment: 0 Education Assessment Education Provided To: Patient Education Topics Provided Welcome To [facility]: Handouts: Welcome To [facility] Methods: Demonstration, Explain/Verbal Responses: State content correctly Wound/Skin Impairment: Handouts: Caring for Your Ulcer, Other: daily dressing changes as prescribed Methods: Demonstration, Explain/Verbal Responses: State content correctly Electronic Signature(s) Signed: 03/17/2017 5:36:29 PM By: Gretta Cool, BSN, RN, CWS, Kim RN, BSN Entered By: Gretta Cool, BSN, RN, CWS, Kim on 03/17/2017 09:33:18 [place], Leanor Kail ([id number]) -------------------------------------------------------------------------------- Wound Assessment Details Patient Name: [name], TESCHNER. Date of Service: 03/17/2017 8:00 AM Medical Record Number: [id number] Patient Account  x W x D 0.6x0.3x0.1 4.5x4x0.1 7x2x0.1 (cm) Area (cm) : 0.141 14.137 10.996 Volume (cm) : 0.014 1.414 1.1 Classification: Unclassifiable Partial Thickness Partial Thickness Exudate Amount: None Present Small None Present Wound Margin: Flat and Intact Indistinct, nonvisible Indistinct, nonvisible Granulation Amount: None Present (0%) None Present (0%) None Present (0%) Necrotic Amount: Large (67-100%) None Present (0%) None Present (0%) Exposed Structures: Fascia: No Fascia: No Fascia: No Fat Layer (Subcutaneous Fat Layer (Subcutaneous Fat Layer (Subcutaneous Tissue) Exposed: No Tissue) Exposed: No Tissue) Exposed: No Tendon: No Tendon: No Tendon: No Quinney, Cleto R. ([id number]) Muscle: No Muscle: No Muscle: No Joint: No Joint: No Joint: No Bone: No Bone: No Bone: No Epithelialization: None Medium (34-66%) Medium (34-66%) Periwound Skin Texture: No Abnormalities Noted Excoriation: Yes Excoriation: Yes Periwound Skin No Abnormalities Noted No Abnormalities Noted No Abnormalities Noted Moisture: Periwound Skin Color: No Abnormalities Noted No Abnormalities Noted No Abnormalities Noted Tenderness on No No No Palpation: Wound  Preparation: Ulcer Cleansing: Ulcer Cleansing: Ulcer Cleansing: Rinsed/Irrigated with Rinsed/Irrigated with Rinsed/Irrigated with Saline Saline Saline Topical Anesthetic Topical Anesthetic Topical Anesthetic Applied: Other: liocaine Applied: None Applied: None 4% Treatment Notes Electronic Signature(s) Signed: 03/17/2017 5:36:29 PM By: Gretta Cool, BSN, RN, CWS, Kim RN, BSN Entered By: Gretta Cool, BSN, RN, CWS, Kim on 03/17/2017 09:20:20 [place], Leanor Kail ([id number]) -------------------------------------------------------------------------------- Multi-Disciplinary Care Plan Details Patient Name: [name], ACKERT. Date of Service: 03/17/2017 8:00 AM Medical Record Number: [id number] Patient Account Number: [id number] Date of Birth/Sex: 29-Oct-1930 (83 y.o. Male) Treating RN: Cornell Barman Primary Care Zareah Hunzeker: Ria Bush Other Clinician: Referring Kamaal Cast: Ria Bush Treating Avrom Robarts/Extender: Melburn Hake, HOYT Weeks in Treatment: 0 Active Inactive Electronic Signature(s) Signed: 04/07/2017 7:59:22 AM By: Gretta Cool, BSN, RN, CWS, Kim RN, BSN Previous Signature: 03/17/2017 5:36:29 PM Version By: Gretta Cool, BSN, RN, CWS, Kim RN, BSN Entered By: Gretta Cool, BSN, RN, CWS, Kim on 03/31/2017 10:02:45 [place], Leanor Kail ([id number]) -------------------------------------------------------------------------------- Pain Assessment Details Patient Name: [name], CAMPIONE. Date of Service: 03/17/2017 8:00 AM Medical Record Number: [id number] Patient Account Number: [id number] Date of Birth/Sex: 09/07/1931 (83 y.o. Male) Treating RN: Cornell Barman Primary Care Godric Lavell: Ria Bush Other Clinician: Referring Quinlee Sciarra: Ria Bush Treating Shemekia Patane/Extender: STONE III, HOYT Weeks in Treatment: 0 Active Problems Location of Pain Severity and Description of Pain Patient Has Paino No Site Locations With Dressing Change: Yes Rate the pain. Current Pain Level: 3 Character of Pain Describe the  Pain: Tender Pain Management and Medication Current Pain Management: Electronic Signature(s) Signed: 03/17/2017 5:36:29 PM By: Gretta Cool, BSN, RN, CWS, Kim RN, BSN Entered By: Gretta Cool, BSN, RN, CWS, Kim on 03/17/2017 08:17:51 [place], Leanor Kail ([id number]) -------------------------------------------------------------------------------- Patient/Caregiver Education Details Patient Name: [name] Date of Service: 03/17/2017 8:00 AM Medical Record Number: [id number] Patient Account Number: [id number] Date of Birth/Gender: April 24, 1931 (83 y.o. Male) Treating RN: Cornell Barman Primary Care Physician: Ria Bush Other Clinician: Referring Physician: Ria Bush Treating Physician/Extender: Sharalyn Ink in Treatment: 0 Education Assessment Education Provided To: Patient Education Topics Provided Welcome To [facility]: Handouts: Welcome To [facility] Methods: Demonstration, Explain/Verbal Responses: State content correctly Wound/Skin Impairment: Handouts: Caring for Your Ulcer, Other: daily dressing changes as prescribed Methods: Demonstration, Explain/Verbal Responses: State content correctly Electronic Signature(s) Signed: 03/17/2017 5:36:29 PM By: Gretta Cool, BSN, RN, CWS, Kim RN, BSN Entered By: Gretta Cool, BSN, RN, CWS, Kim on 03/17/2017 09:33:18 [place], Leanor Kail ([id number]) -------------------------------------------------------------------------------- Wound Assessment Details Patient Name: [name], TESCHNER. Date of Service: 03/17/2017 8:00 AM Medical Record Number: [id number] Patient Account

## 2017-03-19 NOTE — Progress Notes (Signed)
Yes FALL RISK ASSESSMENT: History of falling - immediate or within 3 months 0 No Secondary diagnosis 15 Yes Ambulatory aid None/bed rest/wheelchair/nurse 0 No Crutches/cane/walker 15 Yes Furniture 0 No IV Access/Saline Lock 0 No Gait/Training Normal/bed rest/immobile 0 No Weak 10 Yes Impaired 0 No Mental Status Oriented to own ability 0 Yes Electronic Signature(s) Signed: 03/17/2017 5:36:29 PM By: Gretta Cool, BSN, RN, CWS, Kim RN, BSN Entered By: Gretta Cool, BSN, RN, CWS, Kim on 03/17/2017 08:36:42 [place], Leanor Kail ([id number]) -------------------------------------------------------------------------------- Nutrition Risk Assessment Details Patient Name: [name], [name]. Date of Service: 03/17/2017 8:00 AM Medical Record Number: [id number] Patient Account Number: [id number] Date of Birth/Sex: 1930-12-25 (83 y.o. Male) Treating RN: Cornell Barman Primary Care Treyvin Glidden: Ria Bush Other Clinician: Referring Sovereign Ramiro: KUNEFF, RENEE Treating Stormy Connon/Extender: STONE III, HOYT Weeks in Treatment: 0 Height (in): 66 Weight (lbs): 170 Body Mass Index (BMI): 27.4 Nutrition Risk Assessment Items NUTRITION RISK SCREEN: I have an illness or condition that made me change the kind and/or 0 No amount of food I eat I eat fewer than two meals per day 0 No I  eat few fruits and vegetables, or milk products 0 No I have three or more drinks of beer, liquor or wine almost every day 0 No I have tooth or mouth problems that make it hard for me to eat 0 No I don't always have enough money to buy the food I need 0 No I eat alone most of the time 0 No I take three or more different prescribed or over-the-counter drugs a 0 No day Without wanting to, I have lost or gained 10 pounds in the last six 0 No months I am not always physically able to shop, cook and/or feed myself 0 No Nutrition Protocols Good Risk Protocol 0 No interventions needed Moderate Risk Protocol Electronic Signature(s) Signed: 03/17/2017 5:36:29 PM By: Gretta Cool, BSN, RN, CWS, Kim RN, BSN Entered By: Gretta Cool, BSN, RN, CWS, Kim on 03/17/2017 08:36:55  AAREN, ATALLAH ([id number]) Visit Report for 03/17/2017 Abuse/Suicide Risk Screen Details Patient Name: [name], [name]. Date of Service: 03/17/2017 8:00 AM Medical Record Number: [id number] Patient Account Number: [id number] Date of Birth/Sex: 23-Apr-1931 (83 y.o. Male) Treating RN: Cornell Barman Primary Care Laramie Meissner: Ria Bush Other Clinician: Referring Latangela Mccomas: Howard Pouch Treating Nusaybah Ivie/Extender: STONE III, HOYT Weeks in Treatment: 0 Abuse/Suicide Risk Screen Items Answer ABUSE/SUICIDE RISK SCREEN: Has anyone close to you tried to hurt or harm you recentlyo No Do you feel uncomfortable with anyone in your familyo No Has anyone forced you do things that you didnot want to doo No Do you have any thoughts of harming yourselfo No Patient displays signs or symptoms of abuse and/or neglect. No Electronic Signature(s) Signed: 03/17/2017 5:36:29 PM By: Gretta Cool, BSN, RN, CWS, Kim RN, BSN Entered By: Gretta Cool, BSN, RN, CWS, Kim on 03/17/2017 08:34:42 [place], Leanor Kail ([id number]) -------------------------------------------------------------------------------- Activities of Daily Living Details Patient Name: [name], [name]. Date of Service: 03/17/2017 8:00 AM Medical Record Number: [id number] Patient Account Number: [id number] Date of Birth/Sex: 08-01-1931 (83 y.o. Male) Treating RN: Cornell Barman Primary Care Dallen Bunte: Ria Bush Other Clinician: Referring Jimia Gentles: Howard Pouch Treating Areona Homer/Extender: STONE III, HOYT Weeks in Treatment: 0 Activities of Daily Living Items Answer Activities of Daily Living (Please select one for each item) Drive Automobile Not Able Take Medications Need Assistance Use Telephone Completely Able Care for Appearance Need Assistance Use Toilet Completely Psychologist, forensic / Shower Need Assistance Dress Self Need Assistance Feed Self Completely Able Walk Completely Able Get In / Out Bed Need Assistance Housework Not Able Prepare Meals  Not Able Handle Money Need Assistance Shop for Self Not Able Electronic Signature(s) Signed: 03/17/2017 5:36:29 PM By: Gretta Cool, BSN, RN, CWS, Kim RN, BSN Entered By: Gretta Cool, BSN, RN, CWS, Kim on 03/17/2017 08:35:30 [place], Leanor Kail ([id number]) -------------------------------------------------------------------------------- Education Assessment Details Patient Name: [name]. Date of Service: 03/17/2017 8:00 AM Medical Record Number: [id number] Patient Account Number: [id number] Date of Birth/Sex: [birth year] (83 y.o. Male) Treating RN: Cornell Barman Primary Care Asser Lucena: Ria Bush Other Clinician: Referring Kesley Gaffey: Howard Pouch Treating [facility]/Extender: Melburn Hake, HOYT Weeks in Treatment: 0 Primary Learner Assessed: Patient Learning Preferences/Education Level/Primary Language Learning Preference: Explanation, Demonstration Highest Education Level: College or Above Preferred Language: English Cognitive Barrier Assessment/Beliefs Language Barrier: No Translator Needed: No Memory Deficit: No Emotional Barrier: No Cultural/Religious Beliefs Affecting Medical No Care: Physical Barrier Assessment Impaired Vision: Yes Glasses Impaired Hearing: No Decreased Hand dexterity: No Knowledge/Comprehension Assessment Knowledge Level: High Comprehension Level: High Ability to understand written High instructions: Ability to understand verbal High instructions: Motivation Assessment Anxiety Level: Calm Cooperation: Cooperative Education Importance: Acknowledges Need Interest in Health Problems: Asks Questions Perception: Coherent Willingness to Engage in Self- High Management Activities: Readiness to Engage in Self- High Management Activities: Electronic Signature(s) FARRIS, GEIMAN ([id number]) Signed: 03/17/2017 5:36:29 PM By: Gretta Cool, BSN, RN, CWS, Kim RN, BSN Entered By: Gretta Cool, BSN, RN, CWS, Kim on 03/17/2017 08:36:03 [place], Leanor Kail  ([id number]) -------------------------------------------------------------------------------- Fall Risk Assessment Details Patient Name: [name]. Date of Service: 03/17/2017 8:00 AM Medical Record Number: [id number] Patient Account Number: [id number] Date of Birth/Sex: 06-01-1931 (83 y.o. Male) Treating RN: Cornell Barman Primary Care Anala Whisenant: Ria Bush Other Clinician: Referring Dashley Monts: Howard Pouch Treating Lateya Dauria/Extender: Melburn Hake, HOYT Weeks in Treatment: 0 Fall Risk Assessment Items Have you had 2 or more falls in the last 12 monthso 0 Yes Have you had any fall that resulted in injury in the last 12 monthso 0

## 2017-03-24 ENCOUNTER — Ambulatory Visit (INDEPENDENT_AMBULATORY_CARE_PROVIDER_SITE_OTHER): Payer: Medicare Other | Admitting: Family Medicine

## 2017-03-24 ENCOUNTER — Telehealth: Payer: Self-pay | Admitting: Family Medicine

## 2017-03-24 ENCOUNTER — Ambulatory Visit: Payer: Medicare Other | Admitting: Internal Medicine

## 2017-03-24 ENCOUNTER — Telehealth: Payer: Self-pay

## 2017-03-24 ENCOUNTER — Encounter: Payer: Self-pay | Admitting: Family Medicine

## 2017-03-24 ENCOUNTER — Ambulatory Visit (INDEPENDENT_AMBULATORY_CARE_PROVIDER_SITE_OTHER): Payer: Medicare Other

## 2017-03-24 VITALS — BP 119/57 | HR 106 | Temp 99.3°F | Ht 64.0 in | Wt 171.8 lb

## 2017-03-24 DIAGNOSIS — I639 Cerebral infarction, unspecified: Secondary | ICD-10-CM

## 2017-03-24 DIAGNOSIS — R300 Dysuria: Secondary | ICD-10-CM | POA: Diagnosis not present

## 2017-03-24 LAB — POC URINALSYSI DIPSTICK (AUTOMATED)
BILIRUBIN UA: NEGATIVE
Blood, UA: NEGATIVE
Glucose, UA: NEGATIVE
Ketones, UA: NEGATIVE
LEUKOCYTES UA: NEGATIVE
NITRITE UA: NEGATIVE
PH UA: 6 (ref 5.0–8.0)
Spec Grav, UA: 1.02 (ref 1.010–1.025)
Urobilinogen, UA: 0.2 E.U./dL

## 2017-03-24 LAB — POCT INR: INR: 3.9

## 2017-03-24 NOTE — Assessment & Plan Note (Signed)
In h/o recurrent UTI. UA today reassuringly ok. Will send culture regardless. Will not start abx until culture returns. No signs of candida or intertrigo. Supportive care reviewed. Update if not improving or worsening sxs.

## 2017-03-24 NOTE — Telephone Encounter (Signed)
Pt had appt with Dr Darnell Level 03/24/17 at 3:15.

## 2017-03-24 NOTE — Patient Instructions (Signed)
Pre visit review using our clinic review tool, if applicable. No additional management support is needed unless otherwise documented below in the visit note.  INR today 3.9  Patient's wife reports that he was started on titrating doses of Namenda approx. 2 weeks ago, otherwise no changes to diet, health or medications.  They deny any abnormal bruising or bleeding and are able to confirm correct dosing schedule as of last clinic visit.  Patient is to hold coumadin today (7/17) and then resume taking decreased dosing of 1 pill (2.5mg ) daily EXCEPT for 1.5 pills (3.75mg ) on Wednesdays only, recheck in 3 weeks.  Wife and patient verbalize understanding of all instructions given today and risks associated with supratherapeutic level and will go to ER if any concerns develop.

## 2017-03-24 NOTE — Telephone Encounter (Signed)
PLEASE NOTE: All timestamps contained within this report are represented as [place] Standard Time. CONFIDENTIALTY NOTICE: This fax transmission is intended only for the addressee. It contains information that is legally privileged, confidential or otherwise protected from use or disclosure. If you are not the intended recipient, you are strictly prohibited from reviewing, disclosing, copying using or disseminating any of this information or taking any action in reliance on or regarding this information. If you have received this fax in error, please notify us immediately by telephone so that we can arrange for its return to [place]. Phone: [phone], Toll-Free: [phone], Fax: [phone] Page: 1 of 1 Call Id: [id number] Vevay Patient Name: [name] Gender: Male DOB: 02-15-1931 Age: 83 Y 92 M 14 D Return Phone Number: [phone] (Primary), [phone] (Secondary) City/State/Zip: [place] [address] Client [facility] - Client Client Site [place] - Day Physician Ria Bush - MD Who Is Calling Patient / Member / Family / Caregiver Call Type Triage / Clinical Caller Name Nhan Qualley Relationship To Patient Spouse Return Phone Number [phone] (Secondary) Chief Complaint Urination Pain Reason for Call Symptomatic / Request for [place] states husband c/o burning with urination. Appointment Disposition EMR Caller Not Reached Info pasted into Epic Yes Nurse Assessment Guidelines Guideline Title Affirmed Question Disp. Time Eilene Ghazi Time) Disposition Final User 03/24/2017 1:41:40 PM FINAL ATTEMPT MADE - message left Yes Joline Salt, RN, Malachy Mood

## 2017-03-24 NOTE — Patient Instructions (Addendum)
Urine looking ok today. We will send culture.  In the meantime, push water and lemonade and plenty of rest.  Avoid bladder irritants.

## 2017-03-24 NOTE — Telephone Encounter (Signed)
Pt has appt with Dr Darnell Level on 03/24/17 at 3:15.

## 2017-03-24 NOTE — Progress Notes (Signed)
BP (!) 119/57   Pulse (!) 106   Temp 99.3 F (37.4 C) (Oral)   Ht 5\' 4"  (1.626 m)   Wt 171 lb 12 oz (77.9 kg)   BMI 29.48 kg/m    CC: dysuria Subjective:    Patient ID: [name], male    DOB: 1931-01-02, 83 y.o.   MRN: [id number]  HPI: [name] is a 83 y.o. male presenting on 03/24/2017 for Dysuria   Here with wife [name] with several hour h/o dysuria that started today during lunch. Some nausea this morning. Some R flank and side pain. + urgency (chronic issue).  Nausea with sneezing.   Denies fevers, chills, abd pain, lower back pain, vomiting.   H/o recurrent UTI, h/o overactive bladder and urge incontinence, latest saw urology 10/2016. rec treat only with confirmed UTI by UCx. He did recently complete treatment for PTNS - this may have been helpful.   Seen this morning at coumadin clinic, INR 3.9. See coumadin note for details.   Relevant past medical, surgical, family and social history reviewed and updated as indicated. Interim medical history since our last visit reviewed. Allergies and medications reviewed and updated. Outpatient Medications Prior to Visit  Medication Sig Dispense Refill  . acetaminophen (TYLENOL) 325 MG tablet Take 325-650 mg by mouth every 6 (six) hours as needed (for hip pain).     Marland Kitchen albuterol (PROVENTIL HFA;VENTOLIN HFA) 108 (90 Base) MCG/ACT inhaler Inhale 2 puffs into the lungs every 6 (six) hours as needed for wheezing or shortness of breath. 3 Inhaler 3  . b complex vitamins tablet Take 1 tablet by mouth daily.    . Biotin 2500 MCG CAPS Take 1 capsule by mouth daily.    . Cholecalciferol (VITAMIN D) 2000 UNITS CAPS Take 2,000 Units by mouth daily. Reported on 01/30/2016    . clotrimazole (LOTRIMIN) 1 % cream Apply 1 application topically 2 (two) times daily. (Patient taking differently: Apply 1 application topically 2 (two) times daily as needed (for rash). ) 30 g 0  . Coenzyme Q10 (COQ-10) 100 MG CAPS Take 100 mg by mouth daily.       . Cyanocobalamin (B-12 PO) Take 10,000 mcg by mouth 3 (three) times a week.     . DEXILANT 60 MG capsule TAKE 1 CAPSULE DAILY (Patient taking differently: Take 60 mg by mouth once a day) 90 capsule 3  . Dextran 70-Hypromellose, PF, (ARTIFICIAL TEARS PF) 0.1-0.3 % SOLN Place 1-2 drops into both eyes 3 (three) times daily as needed (for dry eyes).     Marland Kitchen docusate sodium (COLACE) 100 MG capsule Take 100 mg by mouth at bedtime.     . fenofibrate (TRICOR) 145 MG tablet Take 1 tablet (145 mg total) by mouth daily. 90 tablet 3  . fexofenadine (ALLEGRA) 180 MG tablet Take 180 mg by mouth daily as needed for allergies.     . memantine (NAMENDA) 5 MG tablet Take 5mg  daily for 1 week then increase to 10mg  daily 60 tablet 3  . Multiple Vitamins-Minerals (MULTIVITAMIN PO) Take 1 tablet by mouth daily.     . simvastatin (ZOCOR) 40 MG tablet Take 1 tablet (40 mg total) by mouth daily. 90 tablet 3  . venlafaxine (EFFEXOR) 75 MG tablet TAKE ONE TABLET TWICE DAILY WITH MEALS 60 tablet 6  . warfarin (COUMADIN) 2.5 MG tablet Take 1 tablet (2.5 mg total) by mouth daily. As directed (Patient taking differently: Take 2.5-3.75 mg by mouth daily. 2.5 mg at  bedtime on Sun/Mon/Tues/Thurs/Fri and 3.75 mg on Wed/Sat) 90 tablet 1   Facility-Administered Medications Prior to Visit  Medication Dose Route Frequency Provider Last Rate Last Dose  . 0.9 %  sodium chloride infusion  500 mL Intravenous Continuous Milus Banister, MD         Per HPI unless specifically indicated in ROS section below Review of Systems     Objective:    BP (!) 119/57   Pulse (!) 106   Temp 99.3 F (37.4 C) (Oral)   Ht 5\' 4"  (1.626 m)   Wt 171 lb 12 oz (77.9 kg)   BMI 29.48 kg/m   Wt Readings from Last 3 Encounters:  03/24/17 171 lb 12 oz (77.9 kg)  03/16/17 172 lb (78 kg)  03/10/17 171 lb 12 oz (77.9 kg)    Physical Exam  Constitutional: He appears well-developed and well-nourished. No distress.  HENT:  Mouth/Throat: Oropharynx is  clear and moist. No oropharyngeal exudate.  Abdominal: Soft. Normal appearance and bowel sounds are normal. He exhibits no distension and no mass. There is no hepatosplenomegaly. There is no tenderness. There is CVA tenderness (mild R flank). There is no rebound and no guarding.  Genitourinary: Testes normal and penis normal. Uncircumcised. No phimosis, paraphimosis or penile erythema. No discharge found.  Psychiatric: He has a normal mood and affect.  Nursing note and vitals reviewed.  Results for orders placed or performed in visit on 03/24/17  POCT Urinalysis Dipstick (Automated)  Result Value Ref Range   Color, UA yellow    Clarity, UA hazy    Glucose, UA negative    Bilirubin, UA negative    Ketones, UA negative    Spec Grav, UA 1.020 1.010 - 1.025   Blood, UA negative    pH, UA 6.0 5.0 - 8.0   Protein, UA trace    Urobilinogen, UA 0.2 0.2 or 1.0 E.U./dL   Nitrite, UA negative    Leukocytes, UA Negative Negative      Assessment & Plan:   Problem List Items Addressed This Visit    Dysuria - Primary    In h/o recurrent UTI. UA today reassuringly ok. Will send culture regardless. Will not start abx until culture returns. No signs of candida or intertrigo. Supportive care reviewed. Update if not improving or worsening sxs.       Relevant Orders   POCT Urinalysis Dipstick (Automated) (Completed)   Urine Culture       Follow up plan: Return if symptoms worsen or fail to improve.  Ria Bush, MD

## 2017-03-24 NOTE — Telephone Encounter (Signed)
°  Patient Name: [name]  Gender: Male  DOB: 08-Oct-1930   Age: 83 Y 37 M 14 D  Return Phone Number: [phone] (Primary), [phone] (Secondary)  Address:   City/State/Zip: [place] [address]   Client [facility] - Client  Client Site [facility]  Physician Ria Bush - MD  Contact Type Call  Who Is Calling Patient / Member / Family / Caregiver  Call Type Triage / Clinical  Caller Name Taylen Osorto   Relationship To Patient Spouse  Return Phone Number [phone] (Secondary)  Chief Complaint Urination Pain  Reason for Call Symptomatic / Request for [place] states husband c/o burning with urination.  Appointment Disposition EMR Caller Not Reached  Info pasted into Epic Yes  Translation No   Nurse Assessment      Guidelines      Guideline Title Affirmed Question Affirmed Notes Nurse Date/Time (Eastern Time)         Disp. Time Eilene Ghazi Time) Disposition Final User   03/24/2017 12:35:41 PM Send To Clinical Follow Up Rich Brave, Amy   03/24/2017 12:53:53 PM Attempt made - line busy  Catha Brow   03/24/2017 12:54:05 PM Send To RN Personal  Joline Salt, RN, Cheryl   03/24/2017 3:03:27 PM Attempt made - message left  Joline Salt, RN, Malachy Mood    03/24/2017 1:41:40 PM FINAL ATTEMPT MADE - message left Yes Joline Salt, RN, Malachy Mood

## 2017-03-26 ENCOUNTER — Encounter: Payer: Medicare Other | Admitting: Surgery

## 2017-03-26 DIAGNOSIS — L97829 Non-pressure chronic ulcer of other part of left lower leg with unspecified severity: Secondary | ICD-10-CM | POA: Diagnosis not present

## 2017-03-26 DIAGNOSIS — Z86711 Personal history of pulmonary embolism: Secondary | ICD-10-CM | POA: Diagnosis not present

## 2017-03-26 DIAGNOSIS — S80212A Abrasion, left knee, initial encounter: Secondary | ICD-10-CM | POA: Diagnosis not present

## 2017-03-26 DIAGNOSIS — N183 Chronic kidney disease, stage 3 (moderate): Secondary | ICD-10-CM | POA: Diagnosis not present

## 2017-03-26 DIAGNOSIS — S71002A Unspecified open wound, left hip, initial encounter: Secondary | ICD-10-CM | POA: Diagnosis not present

## 2017-03-26 DIAGNOSIS — Z86718 Personal history of other venous thrombosis and embolism: Secondary | ICD-10-CM | POA: Diagnosis not present

## 2017-03-26 DIAGNOSIS — Z8673 Personal history of transient ischemic attack (TIA), and cerebral infarction without residual deficits: Secondary | ICD-10-CM | POA: Diagnosis not present

## 2017-03-26 LAB — URINE CULTURE

## 2017-03-29 NOTE — Progress Notes (Signed)
[name] [name] ([id number]) Granulation Quality: Red N/A N/A Necrotic Amount: None Present (0%) N/A N/A Exposed Structures: Fascia: No N/A N/A Fat Layer (Subcutaneous Tissue) Exposed: No Tendon: No Muscle: No Joint: No Bone: No Epithelialization: None N/A N/A Periwound Skin Texture: Excoriation: No No Abnormalities Noted No Abnormalities Noted Induration: No Callus: No Crepitus: No Rash: No Scarring: No Periwound Skin Maceration: No No Abnormalities Noted No Abnormalities Noted Moisture: Dry/Scaly: No Periwound Skin Color: Atrophie Blanche: No No Abnormalities Noted No Abnormalities Noted Cyanosis: No Ecchymosis: No Erythema: No Hemosiderin Staining: No Mottled: No Pallor: No Rubor: No Tenderness on No No No Palpation: Wound Preparation: Ulcer Cleansing: N/A N/A Rinsed/Irrigated with Saline Topical Anesthetic Applied: Other: liocaine 4% Treatment Notes Wound #1 (Left Ischium) 1. Cleansed with: Clean wound with Normal Saline 2. Anesthetic Topical Lidocaine 4% cream to wound bed prior to debridement 4. Dressing Applied: Other dressing (specify in notes) Notes mupirocin [name] [name] [name] [name] ([id number]) Electronic Signature(s) Signed: 03/26/2017 5:35:05 PM By: Gretta Cool, BSN, RN, CWS, Kim RN, BSN Previous Signature: 03/26/2017 11:24:02 AM Version By: Christin Fudge MD, FACS Entered By: Gretta Cool, BSN, RN, CWS, Kim on 03/26/2017 17:35:05 [place], [name] [name]  ([id number]) -------------------------------------------------------------------------------- Pain Assessment Details Patient Name: [name] [name]. Date of Service: 03/26/2017 11:00 AM Medical Record Number: [id number] Patient Account Number: [id number] Date of Birth/Sex: 08-26-1931 (83 y.o. Male) Treating RN: Montey Hora Primary Care Ocean Schildt: Ria Bush Other Clinician: Referring Ellarose Brandi: Ria Bush Treating Jhaden Pizzuto/Extender: Frann Rider in Treatment: 1 Active Problems Location of Pain Severity and Description of Pain Patient Has Paino No Site Locations Pain Management and Medication Current Pain Management: Notes Topical or injectable lidocaine is offered to patient for acute pain when surgical debridement is performed. If needed, Patient is instructed to use over the counter pain medication for the following 24-48 hours after debridement. Wound care MDs do not prescribed pain medications. Patient has chronic pain or uncontrolled pain. Patient has been instructed to make an appointment with their Primary Care Physician for pain management. Electronic Signature(s) Signed: 03/26/2017 5:34:11 PM By: Gretta Cool, BSN, RN, CWS, Kim RN, BSN Signed: 03/27/2017 4:19:15 PM By: Montey Hora Previous Signature: 03/26/2017 5:08:01 PM Version By: Montey Hora Entered By: Gretta Cool BSN, RN, CWS, Kim on 03/26/2017 17:34:11 [name], [name] [name] ([id number]) -------------------------------------------------------------------------------- Patient/Caregiver Education Details Patient Name: [name], [name]. Date of Service: 03/26/2017 11:00 AM Medical Record Number: [id number] Patient Account Number: [id number] Date of Birth/Gender: [birth year] (83 y.o. Male) Treating RN: Montey Hora Primary Care Physician: Ria Bush Other Clinician: Referring Physician: Ria Bush Treating Physician/Extender: Frann Rider in Treatment: 1 Education Assessment Education  Provided To: Patient and Caregiver Education Topics Provided Wound/Skin Impairment: Handouts: Other: continue mupirocin and call if needed Methods: Explain/Verbal Responses: State content correctly Electronic Signature(s) Signed: 03/26/2017 5:37:09 PM By: Gretta Cool, BSN, RN, CWS, Kim RN, BSN Previous Signature: 03/26/2017 5:08:01 PM Version By: Montey Hora Entered By: Gretta Cool, BSN, RN, CWS, Kim on 03/26/2017 17:37:01 [name], [name] [name] ([id number]) -------------------------------------------------------------------------------- Wound Assessment Details Patient Name: [name], [name]. Date of Service: 03/26/2017 11:00 AM Medical Record Number: [id number] Patient Account Number: [id number] Date of Birth/Sex: [birth year] (83 y.o. Male) Treating RN: Montey Hora Primary Care Salif Tay: Ria Bush Other Clinician: Referring Kear[place] Miu: Ria Bush Treating Rashauna Tep/Extender: Ricard Dillon Weeks in Treatment: 1 Wound Status Wound Number: 1 Primary Trauma, Other Etiology: Wound Location: Left Ischium Wound Open Wounding Event: Gradually Appeared Status: Date Acquired: 02/27/2017 Comorbid Cataracts, Anemia, Asthma, Deep Weeks Of Treatment: 1 History: Vein Thrombosis, Hypotension Clustered Wound: No Photos  [name], [name] ([id number]) Visit Report for 03/26/2017 Arrival Information Details Patient Name: [name], [name]. Date of Service: 03/26/2017 11:00 AM Medical Record Number: [id number] Patient Account Number: [id number] Date of Birth/Sex: Nov 09, 1930 (83 y.o. Male) Treating RN: Montey Hora Primary Care Micalah Cabezas: Ria Bush Other Clinician: Referring Anjelique Makar: Ria Bush Treating Lyra Alaimo/Extender: Frann Rider in Treatment: 1 Visit Information History Since Last Visit Added or deleted any medications: No Patient Arrived: [place] Any new allergies or adverse reactions: No Arrival Time: 10:58 Had a fall or experienced change in No Accompanied By: spouse activities of daily living that may affect Transfer Assistance: None risk of falls: Patient Identification Verified: Yes Signs or symptoms of abuse/neglect since last No Secondary Verification Process Yes visito Completed: Hospitalized since last visit: No Patient Has Alerts: Yes Has Dressing in Place as Prescribed: Yes Patient Alerts: Patient on Blood Pain Present Now: No Thinner warfarin NOT DIABETIC Electronic Signature(s) Signed: 03/26/2017 5:34:03 PM By: Gretta Cool, BSN, RN, CWS, Kim RN, BSN Previous Signature: 03/26/2017 5:08:01 PM Version By: Montey Hora Entered By: Gretta Cool, BSN, RN, CWS, Kim on 03/26/2017 17:34:02 [name], [name] [name] ([id number]) -------------------------------------------------------------------------------- Clinic Level of Care Assessment Details Patient Name: [name], [name]. Date of Service: 03/26/2017 11:00 AM Medical Record Number: [id number] Patient Account Number: [id number] Date of Birth/Sex: 11-28-1930 (83 y.o. Male) Treating RN: Montey Hora Primary Care Augie Vane: Ria Bush Other Clinician: Referring Udell Mazzocco: Ria Bush Treating Cabela Pacifico/Extender: Tito Dine in Treatment: 1 Clinic Level of Care Assessment Items TOOL 4 Quantity  Score []  - Use when only an EandM is performed on FOLLOW-UP visit 0 ASSESSMENTS - Nursing Assessment / Reassessment X - Reassessment of Co-morbidities (includes updates in patient status) 1 10 X - Reassessment of Adherence to Treatment Plan 1 5 ASSESSMENTS - Wound and Skin Assessment / Reassessment X - Simple Wound Assessment / Reassessment - one wound 1 5 []  - Complex Wound Assessment / Reassessment - multiple wounds 0 []  - Dermatologic / Skin Assessment (not related to wound area) 0 ASSESSMENTS - Focused Assessment []  - Circumferential Edema Measurements - multi extremities 0 []  - Nutritional Assessment / Counseling / Intervention 0 []  - Lower Extremity Assessment (monofilament, tuning fork, pulses) 0 []  - Peripheral Arterial Disease Assessment (using hand held doppler) 0 ASSESSMENTS - Ostomy and/or Continence Assessment and Care []  - Incontinence Assessment and Management 0 []  - Ostomy Care Assessment and Management (repouching, etc.) 0 PROCESS - Coordination of Care X - Simple Patient / Family Education for ongoing care 1 15 []  - Complex (extensive) Patient / Family Education for ongoing care 0 []  - Staff obtains Programmer, systems, Records, Test Results / Process Orders 0 []  - Staff telephones HHA, Nursing Homes / Clarify orders / etc 0 []  - Routine Transfer to another Facility (non-emergent condition) 0 [place], [name] [name] ([id number]) []  - Routine Hospital Admission (non-emergent condition) 0 []  - New Admissions / Biomedical engineer / Ordering NPWT, Apligraf, etc. 0 []  - Emergency Hospital Admission (emergent condition) 0 X - Simple Discharge Coordination 1 10 []  - Complex (extensive) Discharge Coordination 0 PROCESS - Special Needs []  - Pediatric / Minor Patient Management 0 []  - Isolation Patient Management 0 []  - Hearing / Language / Visual special needs 0 []  - Assessment of Community assistance (transportation, D/C planning, etc.) 0 []  - Additional assistance / Altered  mentation 0 []  - Support Surface(s) Assessment (bed, cushion, seat, etc.) 0 INTERVENTIONS - Wound Cleansing / Measurement []  - Simple Wound Cleansing - one wound 0 X - Complex  [name] [name] ([id number]) Granulation Quality: Red N/A N/A Necrotic Amount: None Present (0%) N/A N/A Exposed Structures: Fascia: No N/A N/A Fat Layer (Subcutaneous Tissue) Exposed: No Tendon: No Muscle: No Joint: No Bone: No Epithelialization: None N/A N/A Periwound Skin Texture: Excoriation: No No Abnormalities Noted No Abnormalities Noted Induration: No Callus: No Crepitus: No Rash: No Scarring: No Periwound Skin Maceration: No No Abnormalities Noted No Abnormalities Noted Moisture: Dry/Scaly: No Periwound Skin Color: Atrophie Blanche: No No Abnormalities Noted No Abnormalities Noted Cyanosis: No Ecchymosis: No Erythema: No Hemosiderin Staining: No Mottled: No Pallor: No Rubor: No Tenderness on No No No Palpation: Wound Preparation: Ulcer Cleansing: N/A N/A Rinsed/Irrigated with Saline Topical Anesthetic Applied: Other: liocaine 4% Treatment Notes Wound #1 (Left Ischium) 1. Cleansed with: Clean wound with Normal Saline 2. Anesthetic Topical Lidocaine 4% cream to wound bed prior to debridement 4. Dressing Applied: Other dressing (specify in notes) Notes mupirocin [name] [name] [name] [name] ([id number]) Electronic Signature(s) Signed: 03/26/2017 5:35:05 PM By: Gretta Cool, BSN, RN, CWS, Kim RN, BSN Previous Signature: 03/26/2017 11:24:02 AM Version By: Christin Fudge MD, FACS Entered By: Gretta Cool, BSN, RN, CWS, Kim on 03/26/2017 17:35:05 [place], [name] [name]  ([id number]) -------------------------------------------------------------------------------- Pain Assessment Details Patient Name: [name] [name]. Date of Service: 03/26/2017 11:00 AM Medical Record Number: [id number] Patient Account Number: [id number] Date of Birth/Sex: 08-26-1931 (83 y.o. Male) Treating RN: Montey Hora Primary Care Ocean Schildt: Ria Bush Other Clinician: Referring Ellarose Brandi: Ria Bush Treating Jhaden Pizzuto/Extender: Frann Rider in Treatment: 1 Active Problems Location of Pain Severity and Description of Pain Patient Has Paino No Site Locations Pain Management and Medication Current Pain Management: Notes Topical or injectable lidocaine is offered to patient for acute pain when surgical debridement is performed. If needed, Patient is instructed to use over the counter pain medication for the following 24-48 hours after debridement. Wound care MDs do not prescribed pain medications. Patient has chronic pain or uncontrolled pain. Patient has been instructed to make an appointment with their Primary Care Physician for pain management. Electronic Signature(s) Signed: 03/26/2017 5:34:11 PM By: Gretta Cool, BSN, RN, CWS, Kim RN, BSN Signed: 03/27/2017 4:19:15 PM By: Montey Hora Previous Signature: 03/26/2017 5:08:01 PM Version By: Montey Hora Entered By: Gretta Cool BSN, RN, CWS, Kim on 03/26/2017 17:34:11 [name], [name] [name] ([id number]) -------------------------------------------------------------------------------- Patient/Caregiver Education Details Patient Name: [name], [name]. Date of Service: 03/26/2017 11:00 AM Medical Record Number: [id number] Patient Account Number: [id number] Date of Birth/Gender: [birth year] (83 y.o. Male) Treating RN: Montey Hora Primary Care Physician: Ria Bush Other Clinician: Referring Physician: Ria Bush Treating Physician/Extender: Frann Rider in Treatment: 1 Education Assessment Education  Provided To: Patient and Caregiver Education Topics Provided Wound/Skin Impairment: Handouts: Other: continue mupirocin and call if needed Methods: Explain/Verbal Responses: State content correctly Electronic Signature(s) Signed: 03/26/2017 5:37:09 PM By: Gretta Cool, BSN, RN, CWS, Kim RN, BSN Previous Signature: 03/26/2017 5:08:01 PM Version By: Montey Hora Entered By: Gretta Cool, BSN, RN, CWS, Kim on 03/26/2017 17:37:01 [name], [name] [name] ([id number]) -------------------------------------------------------------------------------- Wound Assessment Details Patient Name: [name], [name]. Date of Service: 03/26/2017 11:00 AM Medical Record Number: [id number] Patient Account Number: [id number] Date of Birth/Sex: [birth year] (83 y.o. Male) Treating RN: Montey Hora Primary Care Salif Tay: Ria Bush Other Clinician: Referring Keari Miu: Ria Bush Treating Rashauna Tep/Extender: Ricard Dillon Weeks in Treatment: 1 Wound Status Wound Number: 1 Primary Trauma, Other Etiology: Wound Location: Left Ischium Wound Open Wounding Event: Gradually Appeared Status: Date Acquired: 02/27/2017 Comorbid Cataracts, Anemia, Asthma, Deep Weeks Of Treatment: 1 History: Vein Thrombosis, Hypotension Clustered Wound: No Photos  Wound Cleansing - multiple wounds 3 5 X - Wound Imaging (photographs - any number of wounds) 1 5 []  - Wound Tracing (instead of photographs) 0 []  - Simple Wound Measurement - one wound 0 X - Complex Wound Measurement - multiple wounds 3 5 INTERVENTIONS - Wound Dressings []  - Small Wound Dressing one or multiple wounds 0 []  - Medium Wound Dressing one or multiple wounds 0 []  - Large Wound Dressing one or multiple wounds 0 X - Application of Medications - topical 1 5 []  - Application of Medications - injection 0 INTERVENTIONS - Miscellaneous []  - External ear exam 0 [name] [name] R. ([id number]) []  - Specimen Collection (cultures, biopsies, blood, body fluids, etc.) 0 []  - Specimen(s) / Culture(s) sent or taken to Lab for analysis 0 []  - Patient Transfer (multiple staff / Harrel Lemon Lift / Similar devices) 0 []  - Simple Staple / Suture removal (25 or less) 0 []  - Complex Staple / Suture removal (26 or more) 0 []  - Hypo / Hyperglycemic Management (close monitor of Blood Glucose) 0 []  - Ankle / Brachial Index (ABI) - do not check if billed separately 0 X - Vital Signs 1 5 Has the patient been seen at the hospital within the last three years: Yes Total Score: 90 Level Of Care: New/Established - Level 3 Electronic Signature(s) Signed: 03/26/2017 5:08:01 PM By: Montey Hora Entered By: Montey Hora on 03/26/2017 11:46:44 [place], [name] [name] ([id number]) -------------------------------------------------------------------------------- Encounter Discharge Information Details Patient Name: [name] [name]. Date of Service: 03/26/2017 11:00 AM Medical Record Number: [id number] Patient Account Number: [id number] Date of Birth/Sex: 1930-11-02 (83 y.o. Male) Treating RN: Montey Hora Primary Care Tannis Burstein: Ria Bush Other Clinician: Referring Jayce Kainz: Ria Bush Treating Raynelle Fujikawa/Extender: Frann Rider in Treatment: 1 Encounter Discharge Information Items Discharge Pain Level: 0 Discharge Condition: Stable Ambulatory Status: [place] Discharge Destination: Home Transportation: Private Auto Accompanied By: spouse Schedule Follow-up Appointment: No Medication Reconciliation completed No and provided to Patient/Care Derrian Rodak: Provided on Clinical Summary of Care: 03/26/2017 Form Type Recipient Paper Patient DP Electronic Signature(s) Signed: 03/26/2017 5:36:52 PM By: Gretta Cool, BSN, RN, CWS, Kim RN, BSN Previous Signature: 03/26/2017 11:48:14 AM Version By: Montey Hora Previous Signature: 03/26/2017 11:19:58 AM Version By: Ruthine Dose Entered By: Gretta Cool BSN, RN, CWS, Kim on 03/26/2017 17:36:52 [place], [name] [name] ([id number]) -------------------------------------------------------------------------------- Multi Wound Chart Details Patient Name: [name], [name]. Date of Service: 03/26/2017 11:00 AM Medical Record Number: [id number] Patient Account Number: [id number] Date of Birth/Sex: May 23, 1931 (83 y.o. Male) Treating RN: Montey Hora Primary Care Meilyn Heindl: Ria Bush Other Clinician: Referring Kassem Kibbe: Ria Bush Treating Treyvone Chelf/Extender: Frann Rider in Treatment: 1 Vital Signs Height(in): 66 Pulse(bpm): 89 Weight(lbs): 170 Blood Pressure 123/49 (mmHg): Body Mass Index(BMI): 27 Temperature(F): 98.3 Respiratory Rate 16 (breaths/min): Photos: Wound Location: Left Ischium Left, Midline, Anterior Left, Medial Knee Knee Wounding Event: Gradually Appeared Gradually Appeared Gradually Appeared Primary Etiology: Trauma, Other Abrasion Abrasion Comorbid History: Cataracts, Anemia, N/A N/A Asthma, Deep Vein Thrombosis, Hypotension Date Acquired: 02/27/2017 03/02/2017 03/02/2017 Weeks of Treatment: 1 1 1  Wound Status: Open Open Healed -  Epithelialized Clustered Wound: No Yes Yes Measurements L x W x D 0.5x0.2x0.1 0x0x0 0x0x0 (cm) Area (cm) : 0.079 0 0 Volume (cm) : 0.008 0 0 % Reduction in Area: 44.00% 100.00% 100.00% % Reduction in Volume: 42.90% 100.00% 100.00% Classification: Full Thickness Without Partial Thickness Partial Thickness Exposed Support Structures Exudate Amount: None Present N/A N/A Wound Margin: Flat and Intact N/A N/A Granulation Amount: Large (67-100%) N/A N/A [name] [name],  [name], [name] ([id number]) Visit Report for 03/26/2017 Arrival Information Details Patient Name: [name], [name]. Date of Service: 03/26/2017 11:00 AM Medical Record Number: [id number] Patient Account Number: [id number] Date of Birth/Sex: Nov 09, 1930 (83 y.o. Male) Treating RN: Montey Hora Primary Care Micalah Cabezas: Ria Bush Other Clinician: Referring Anjelique Makar: Ria Bush Treating Lyra Alaimo/Extender: Frann Rider in Treatment: 1 Visit Information History Since Last Visit Added or deleted any medications: No Patient Arrived: [place] Any new allergies or adverse reactions: No Arrival Time: 10:58 Had a fall or experienced change in No Accompanied By: spouse activities of daily living that may affect Transfer Assistance: None risk of falls: Patient Identification Verified: Yes Signs or symptoms of abuse/neglect since last No Secondary Verification Process Yes visito Completed: Hospitalized since last visit: No Patient Has Alerts: Yes Has Dressing in Place as Prescribed: Yes Patient Alerts: Patient on Blood Pain Present Now: No Thinner warfarin NOT DIABETIC Electronic Signature(s) Signed: 03/26/2017 5:34:03 PM By: Gretta Cool, BSN, RN, CWS, Kim RN, BSN Previous Signature: 03/26/2017 5:08:01 PM Version By: Montey Hora Entered By: Gretta Cool, BSN, RN, CWS, Kim on 03/26/2017 17:34:02 [name], [name] [name] ([id number]) -------------------------------------------------------------------------------- Clinic Level of Care Assessment Details Patient Name: [name], [name]. Date of Service: 03/26/2017 11:00 AM Medical Record Number: [id number] Patient Account Number: [id number] Date of Birth/Sex: 11-28-1930 (83 y.o. Male) Treating RN: Montey Hora Primary Care Augie Vane: Ria Bush Other Clinician: Referring Udell Mazzocco: Ria Bush Treating Cabela Pacifico/Extender: Tito Dine in Treatment: 1 Clinic Level of Care Assessment Items TOOL 4 Quantity  Score []  - Use when only an EandM is performed on FOLLOW-UP visit 0 ASSESSMENTS - Nursing Assessment / Reassessment X - Reassessment of Co-morbidities (includes updates in patient status) 1 10 X - Reassessment of Adherence to Treatment Plan 1 5 ASSESSMENTS - Wound and Skin Assessment / Reassessment X - Simple Wound Assessment / Reassessment - one wound 1 5 []  - Complex Wound Assessment / Reassessment - multiple wounds 0 []  - Dermatologic / Skin Assessment (not related to wound area) 0 ASSESSMENTS - Focused Assessment []  - Circumferential Edema Measurements - multi extremities 0 []  - Nutritional Assessment / Counseling / Intervention 0 []  - Lower Extremity Assessment (monofilament, tuning fork, pulses) 0 []  - Peripheral Arterial Disease Assessment (using hand held doppler) 0 ASSESSMENTS - Ostomy and/or Continence Assessment and Care []  - Incontinence Assessment and Management 0 []  - Ostomy Care Assessment and Management (repouching, etc.) 0 PROCESS - Coordination of Care X - Simple Patient / Family Education for ongoing care 1 15 []  - Complex (extensive) Patient / Family Education for ongoing care 0 []  - Staff obtains Programmer, systems, Records, Test Results / Process Orders 0 []  - Staff telephones HHA, Nursing Homes / Clarify orders / etc 0 []  - Routine Transfer to another Facility (non-emergent condition) 0 [place], [name] [name] ([id number]) []  - Routine Hospital Admission (non-emergent condition) 0 []  - New Admissions / Biomedical engineer / Ordering NPWT, Apligraf, etc. 0 []  - Emergency Hospital Admission (emergent condition) 0 X - Simple Discharge Coordination 1 10 []  - Complex (extensive) Discharge Coordination 0 PROCESS - Special Needs []  - Pediatric / Minor Patient Management 0 []  - Isolation Patient Management 0 []  - Hearing / Language / Visual special needs 0 []  - Assessment of Community assistance (transportation, D/C planning, etc.) 0 []  - Additional assistance / Altered  mentation 0 []  - Support Surface(s) Assessment (bed, cushion, seat, etc.) 0 INTERVENTIONS - Wound Cleansing / Measurement []  - Simple Wound Cleansing - one wound 0 X - Complex

## 2017-03-30 NOTE — Progress Notes (Signed)
DAIN, LASETER ([id number]) Visit Report for 03/26/2017 Chief Complaint Document Details Patient Name: [name], [name]. Date of Service: 03/26/2017 11:00 AM Medical Record Number: [id number] Patient Account Number: [id number] Date of Birth/Sex: [birth year] (83 y.o. Male) Treating RN: Montey Hora Primary Care Provider: Ria Bush Other Clinician: Referring Provider: Ria Bush Treating Provider/Extender: Frann Rider in Treatment: 1 Information Obtained from: Patient Chief Complaint Left lower leg ulcers Electronic Signature(s) Signed: 03/26/2017 5:35:17 PM By: Gretta Cool, BSN, RN, CWS, Kim RN, BSN Signed: 03/27/2017 4:13:09 PM By: Christin Fudge MD, FACS Previous Signature: 03/26/2017 11:24:10 AM Version By: Christin Fudge MD, FACS Entered By: Gretta Cool, BSN, RN, CWS, Kim on 03/26/2017 17:35:17 [place], [name] ([id number]) -------------------------------------------------------------------------------- HPI Details Patient Name: [name], [name]. Date of Service: 03/26/2017 11:00 AM Medical Record Number: [id number] Patient Account Number: [id number] Date of Birth/Sex: [birth year] (83 y.o. Male) Treating RN: Montey Hora Primary Care Provider: Ria Bush Other Clinician: Referring Provider: Ria Bush Treating Provider/Extender: Frann Rider in Treatment: 1 History of Present Illness HPI Description: 03/17/17 on evaluation today patient presents with wounds of the left lower extremity which have been present for about three weeks. His wife is a retired Designer, jewellery and takes care of him. Currently they have been using bag balm but otherwise no other current therapy. She feels like that the wounds of the knee region appear to be doing somewhat better unfortunately she was having issues getting these wounds to fully healed. Patient fortunately does not have a significant amount of pain although he does have some discomfort. He said previous  hip replacements as well as the first injection this was several months back prior to when the wounds appeared and I do not feel it is related. Fortunately there's no evidence of significant infection and he does not go outside so these are not likely insect bites least not tick bites. Patient does have a history of DVT and pulmonary embolism in the 1990s as well as the DVT and left lower extremity in 2017. She had set in 2015, a stroke in 2017, and stage III kidney disease. He does angulate using a walker. Electronic Signature(s) Signed: 03/26/2017 5:35:30 PM By: Gretta Cool, BSN, RN, CWS, Kim RN, BSN Signed: 03/27/2017 4:13:09 PM By: Christin Fudge MD, FACS Previous Signature: 03/26/2017 11:24:17 AM Version By: Christin Fudge MD, FACS Entered By: Gretta Cool BSN, RN, CWS, Kim on 03/26/2017 17:35:30 [place], [name] ([id number]) -------------------------------------------------------------------------------- Physical Exam Details Patient Name: [name], [name]. Date of Service: 03/26/2017 11:00 AM Medical Record Number: [id number] Patient Account Number: [id number] Date of Birth/Sex: [birth year] (83 y.o. Male) Treating RN: Montey Hora Primary Care Provider: Ria Bush Other Clinician: Referring Provider: Ria Bush Treating Provider/Extender: Frann Rider in Treatment: 1 Constitutional . Pulse regular. Respirations normal and unlabored. Afebrile. . Eyes Nonicteric. Reactive to light. Ears, Nose, Mouth, and Throat Lips, teeth, and gums WNL.Marland Kitchen Moist mucosa without lesions. Neck supple and nontender. No palpable supraclavicular or cervical adenopathy. Normal sized without goiter. Respiratory WNL. No retractions.. Cardiovascular Pedal Pulses WNL. No clubbing, cyanosis or edema. Chest Breasts symmetical and no nipple discharge.. Breast tissue WNL, no masses, lumps, or tenderness.. Lymphatic No adneopathy. No adenopathy. No adenopathy. Musculoskeletal Adexa without tenderness  or enlargement.. Digits and nails w/o clubbing, cyanosis, infection, petechiae, ischemia, or inflammatory conditions.. Integumentary (Hair, Skin) No suspicious lesions. No crepitus or fluctuance. No peri-wound warmth or erythema. No masses.Marland Kitchen Psychiatric Judgement and insight Intact.. No evidence of depression, anxiety, or agitation.. Notes the areas around the knee have completely  healed and the one on the left gluteal area and hip is now very shallow and almost completely healed. Electronic Signature(s) Signed: 03/26/2017 5:35:55 PM By: Gretta Cool, BSN, RN, CWS, Kim RN, BSN Signed: 03/27/2017 4:13:09 PM By: Christin Fudge MD, FACS Previous Signature: 03/26/2017 11:24:43 AM Version By: Christin Fudge MD, FACS Entered By: Gretta Cool BSN, RN, CWS, Kim on 03/26/2017 17:35:55 [place], [name] ([id number]) -------------------------------------------------------------------------------- Physician Orders Details Patient Name: [name], [name]. Date of Service: 03/26/2017 11:00 AM Medical Record Number: [id number] Patient Account Number: [id number] Date of Birth/Sex: 01-30-1931 (83 y.o. Male) Treating RN: Montey Hora Primary Care Provider: Ria Bush Other Clinician: Referring Provider: Ria Bush Treating Provider/Extender: Frann Rider in Treatment: 1 Verbal / Phone Orders: No Diagnosis Coding Wound Cleansing Wound #1 Left Ischium o Cleanse wound with mild soap and water o May Shower, gently pat wound dry prior to applying new dressing. Primary Wound Dressing Wound #1 Left Ischium o Mupirocin Ointment Dressing Change Frequency Wound #1 Left Ischium o Change dressing every day. Discharge From [facility] Wound #1 Left Ischium o Discharge from [place] Signature(s) Signed: 03/26/2017 5:37:09 PM By: Gretta Cool, BSN, RN, CWS, Kim RN, BSN Signed: 03/27/2017 4:13:09 PM By: Christin Fudge MD, FACS Previous Signature: 03/26/2017 5:08:01 PM Version By:  Montey Hora Entered By: Gretta Cool BSN, RN, CWS, Kim on 03/26/2017 17:36:10 [place], [name] ([id number]) -------------------------------------------------------------------------------- Problem List Details Patient Name: [name], [name]. Date of Service: 03/26/2017 11:00 AM Medical Record Number: [id number] Patient Account Number: [id number] Date of Birth/Sex: July 18, 1931 (83 y.o. Male) Treating RN: Montey Hora Primary Care Provider: Ria Bush Other Clinician: Referring Provider: Ria Bush Treating Provider/Extender: Frann Rider in Treatment: 1 Active Problems ICD-10 Encounter Code Description Active Date Diagnosis S80.212A Abrasion, left knee, initial encounter 03/17/2017 Yes S71.002A Unspecified open wound, left hip, initial encounter 03/17/2017 Yes N18.3 Chronic kidney disease, stage 3 (moderate) 03/17/2017 Yes Z86.73 Personal history of transient ischemic attack (TIA), and 03/17/2017 Yes cerebral infarction without residual deficits Inactive Problems Resolved Problems Electronic Signature(s) Signed: 03/26/2017 5:34:56 PM By: Gretta Cool, BSN, RN, CWS, Kim RN, BSN Signed: 03/27/2017 4:13:09 PM By: Christin Fudge MD, FACS Previous Signature: 03/26/2017 11:23:56 AM Version By: Christin Fudge MD, FACS Entered By: Gretta Cool, BSN, RN, CWS, Kim on 03/26/2017 17:34:56 [place], [name] ([id number]) -------------------------------------------------------------------------------- Progress Note Details Patient Name: [name], [name]. Date of Service: 03/26/2017 11:00 AM Medical Record Number: [id number] Patient Account Number: [id number] Date of Birth/Sex: 1931/01/20 (83 y.o. Male) Treating RN: Montey Hora Primary Care Provider: Ria Bush Other Clinician: Referring Provider: Ria Bush Treating Provider/Extender: Frann Rider in Treatment: 1 Subjective Chief Complaint Information obtained from Patient Left lower leg ulcers History of Present  Illness (HPI) 03/17/17 on evaluation today patient presents with wounds of the left lower extremity which have been present for about three weeks. His wife is a retired Designer, jewellery and takes care of him. Currently they have been using bag balm but otherwise no other current therapy. She feels like that the wounds of the knee region appear to be doing somewhat better unfortunately she was having issues getting these wounds to fully healed. Patient fortunately does not have a significant amount of pain although he does have some discomfort. He said previous hip replacements as well as the first injection this was several months back prior to when the wounds appeared and I do not feel it is related. Fortunately there's no evidence of significant infection and he does not go outside so these are not likely  insect bites least not tick bites. Patient does have a history of DVT and pulmonary embolism in the 1990s as well as the DVT and left lower extremity in 2017. She had set in 2015, a stroke in 2017, and stage III kidney disease. He does angulate using a walker. Objective Constitutional Pulse regular. Respirations normal and unlabored. Afebrile. Vitals Time Taken: 10:59 AM, Height: 66 in, Weight: 170 lbs, BMI: 27.4, Temperature: 98.3 F, Pulse: 89 bpm, Respiratory Rate: 16 breaths/min, Blood Pressure: 123/49 mmHg. Eyes Nonicteric. Reactive to light. Ears, Nose, Mouth, and Throat Mccarron, Browning R. ([id number]) Lips, teeth, and gums WNL.Marland Kitchen Moist mucosa without lesions. Neck supple and nontender. No palpable supraclavicular or cervical adenopathy. Normal sized without goiter. Respiratory WNL. No retractions.. Cardiovascular Pedal Pulses WNL. No clubbing, cyanosis or edema. Chest Breasts symmetical and no nipple discharge.. Breast tissue WNL, no masses, lumps, or tenderness.. Lymphatic No adneopathy. No adenopathy. No adenopathy. Musculoskeletal Adexa without tenderness or  enlargement.. Digits and nails w/o clubbing, cyanosis, infection, petechiae, ischemia, or inflammatory conditions.Marland Kitchen Psychiatric Judgement and insight Intact.. No evidence of depression, anxiety, or agitation.. General Notes: the areas around the knee have completely healed and the one on the left gluteal area and hip is now very shallow and almost completely healed. Integumentary (Hair, Skin) No suspicious lesions. No crepitus or fluctuance. No peri-wound warmth or erythema. No masses.. Wound #1 status is Open. Original cause of wound was Gradually Appeared. The wound is located on the Left Ischium. The wound measures 0.5cm length x 0.2cm width x 0.1cm depth; 0.079cm^2 area and 0.008cm^3 volume. There is no tunneling or undermining noted. There is a none present amount of drainage noted. The wound margin is flat and intact. There is large (67-100%) red granulation within the wound bed. There is no necrotic tissue within the wound bed. The periwound skin appearance did not exhibit: Callus, Crepitus, Excoriation, Induration, Rash, Scarring, Dry/Scaly, Maceration, Atrophie Blanche, Cyanosis, Ecchymosis, Hemosiderin Staining, Mottled, Pallor, Rubor, Erythema. Wound #2 status is Open. Original cause of wound was Gradually Appeared. The wound is located on the Left,Midline,Anterior Knee. The wound measures 0cm length x 0cm width x 0cm depth; 0cm^2 area and 0cm^3 volume. Wound #3 status is Healed - Epithelialized. Original cause of wound was Gradually Appeared. The wound is located on the Left,Medial Knee. The wound measures 0cm length x 0cm width x 0cm depth; 0cm^2 area and 0cm^3 volume. Assessment [name], [name] ([id number]) Active Problems ICD-10 S80.212A - Abrasion, left knee, initial encounter S71.002A - Unspecified open wound, left hip, initial encounter N18.3 - Chronic kidney disease, stage 3 (moderate) Z86.73 - Personal history of transient ischemic attack (TIA), and cerebral  infarction without residual deficits Plan Wound Cleansing: Wound #1 Left Ischium: Cleanse wound with mild soap and water May Shower, gently pat wound dry prior to applying new dressing. Primary Wound Dressing: Wound #1 Left Ischium: Mupirocin Ointment Dressing Change Frequency: Wound #1 Left Ischium: Change dressing every day. Discharge From [facility] Services: Wound #1 Left Ischium: Discharge from [place] Having reviewed him today, I note that this may have been a small furuncle which has now healed. His wife is a Designer, jewellery and she will continue to monitor him and return to see [place] only if needed. she is using Bactroban on this area and I have recommended she continue this. Electronic Signature(s) Signed: 03/26/2017 5:36:23 PM By: Gretta Cool, BSN, RN, CWS, Kim RN, BSN Signed: 03/27/2017 4:13:09 PM By: Christin Fudge MD, FACS Previous Signature: 03/26/2017 11:26:03 AM Version By:  Christin Fudge MD, FACS Entered By: Gretta Cool, BSN, RN, CWS, Kim on 03/26/2017 17:36:23 [place], [name] ([id number]) -------------------------------------------------------------------------------- SuperBill Details Patient Name: [name]. Date of Service: 03/26/2017 Medical Record Number: [id number] Patient Account Number: [id number] Date of Birth/Sex: 1931-04-04 (83 y.o. Male) Treating RN: Montey Hora Primary Care Provider: Ria Bush Other Clinician: Referring Provider: Ria Bush Treating Provider/Extender: Frann Rider in Treatment: 1 Diagnosis Coding ICD-10 Codes Code Description [phone] Abrasion, left knee, initial encounter S71.002A Unspecified open wound, left hip, initial encounter N18.3 Chronic kidney disease, stage 3 (moderate) Personal history of transient ischemic attack (TIA), and cerebral infarction without residual Z86.73 deficits Facility Procedures CPT4 Code: [id number] Description: [place] VISIT-LEV 3 EST PT Modifier: Quantity:  1 Physician Procedures CPT4 Code: [id number] Description: 03754 - WC PHYS LEVEL 2 - EST PT ICD-10 Description Diagnosis S80.212A Abrasion, left knee, initial encounter S71.002A Unspecified open wound, left hip, initial encou N18.3 Chronic kidney disease, stage 3 (moderate) Modifier: nter Quantity: 1 Electronic Signature(s) Signed: 03/26/2017 5:36:33 PM By: Gretta Cool, BSN, RN, CWS, Kim RN, BSN Signed: 03/27/2017 4:13:09 PM By: Christin Fudge MD, FACS Previous Signature: 03/26/2017 11:47:00 AM Version By: Montey Hora Previous Signature: 03/26/2017 11:26:40 AM Version By: Christin Fudge MD, FACS Entered By: Gretta Cool, BSN, RN, CWS, Kim on 03/26/2017 17:36:33

## 2017-04-03 DIAGNOSIS — N3941 Urge incontinence: Secondary | ICD-10-CM | POA: Diagnosis not present

## 2017-04-05 ENCOUNTER — Encounter: Payer: Self-pay | Admitting: Family Medicine

## 2017-04-13 DIAGNOSIS — R350 Frequency of micturition: Secondary | ICD-10-CM | POA: Diagnosis not present

## 2017-04-13 DIAGNOSIS — N390 Urinary tract infection, site not specified: Secondary | ICD-10-CM | POA: Diagnosis not present

## 2017-04-13 DIAGNOSIS — R300 Dysuria: Secondary | ICD-10-CM | POA: Diagnosis not present

## 2017-04-14 ENCOUNTER — Ambulatory Visit (INDEPENDENT_AMBULATORY_CARE_PROVIDER_SITE_OTHER): Payer: Medicare Other

## 2017-04-14 DIAGNOSIS — I639 Cerebral infarction, unspecified: Secondary | ICD-10-CM

## 2017-04-14 LAB — POCT INR: INR: 3.8

## 2017-04-14 NOTE — Patient Instructions (Signed)
Pre visit review using our clinic review tool, if applicable. No additional management support is needed unless otherwise documented below in the visit note.  INR TODAY 3.8  Patient started on Bid Doxycycline yesterday for UTI.  Overall, he is doing well with no other reports of acute illness or injury and no signs of abnormal bruising or bleeding.  He is likely be on doxycycline for the next 7 days and will need dosage adjusted accordingly.    Patient will hold coumadin today (04/14/17) and then restart taking 1/2 of daily dose while on abx..  (Patient is to take 1/2 pill (1.25mg ) daily while on abx therapy and recheck with me here in the coumadin clinic in 1 week 04/21/17).  Wife is to call me once culture results are in to verify whether type of abx or length of therapy changes.  Wife verbalizes understanding of all instructions given today, also educated on risks associated with a supratherapeutic level and will go to ER if any concerns develop.

## 2017-04-21 ENCOUNTER — Ambulatory Visit (INDEPENDENT_AMBULATORY_CARE_PROVIDER_SITE_OTHER): Payer: Medicare Other

## 2017-04-21 DIAGNOSIS — I639 Cerebral infarction, unspecified: Secondary | ICD-10-CM | POA: Diagnosis not present

## 2017-04-21 LAB — POCT INR: INR: 1.5

## 2017-04-21 NOTE — Patient Instructions (Signed)
Pre visit review using our clinic review tool, if applicable. No additional management support is needed unless otherwise documented below in the visit note.   INR today 1.5  Patient has been on reduced dosing while on doxycycline.  He finished abx therapy yesterday am and will require a boost and to resume prior dosing thereafter.  He is doing well with no other changes in his diet, medications or general health.   Wife present and assists patient with his medications daily.  He will take 1.5 pills (3.75mg ) today 8/14 and 2 pills (5mg ) tomorrow 8/15 and then resume taking 1 pills (2.5mg ) daily EXCEPT for 1.5 pills (3.75mg ) on Wednesdays only.  Recheck in 1 week when he returns to see his PCP for follow up.  Wife and patient verbalize understanding of all instructions today and risks associated with subtherapeutic level, will go to ER if any concerns develop.

## 2017-04-22 ENCOUNTER — Other Ambulatory Visit: Payer: Self-pay | Admitting: Family Medicine

## 2017-04-22 NOTE — Telephone Encounter (Signed)
Patient is compliant with coumadin management, will refill X 6 months.   

## 2017-04-28 ENCOUNTER — Ambulatory Visit (INDEPENDENT_AMBULATORY_CARE_PROVIDER_SITE_OTHER): Payer: Medicare Other | Admitting: Family Medicine

## 2017-04-28 ENCOUNTER — Encounter: Payer: Self-pay | Admitting: Family Medicine

## 2017-04-28 VITALS — BP 112/60 | HR 88 | Temp 98.4°F | Wt 166.5 lb

## 2017-04-28 DIAGNOSIS — R300 Dysuria: Secondary | ICD-10-CM

## 2017-04-28 DIAGNOSIS — N390 Urinary tract infection, site not specified: Secondary | ICD-10-CM | POA: Diagnosis not present

## 2017-04-28 DIAGNOSIS — N3941 Urge incontinence: Secondary | ICD-10-CM | POA: Diagnosis not present

## 2017-04-28 DIAGNOSIS — I639 Cerebral infarction, unspecified: Secondary | ICD-10-CM | POA: Diagnosis not present

## 2017-04-28 DIAGNOSIS — F0390 Unspecified dementia without behavioral disturbance: Secondary | ICD-10-CM

## 2017-04-28 LAB — POC URINALSYSI DIPSTICK (AUTOMATED)
Bilirubin, UA: NEGATIVE
GLUCOSE UA: NEGATIVE
Ketones, UA: NEGATIVE
NITRITE UA: NEGATIVE
Protein, UA: POSITIVE
Spec Grav, UA: 1.02 (ref 1.010–1.025)
UROBILINOGEN UA: 0.2 U/dL
pH, UA: 6.5 (ref 5.0–8.0)

## 2017-04-28 LAB — POCT INR: INR: 3.1

## 2017-04-28 NOTE — Patient Instructions (Addendum)
Continue namenda. Urinalysis today - urine culture sent.  Return as needed or in 3 months for follow up visit.

## 2017-04-28 NOTE — Assessment & Plan Note (Signed)
Stable period, tolerating namenda 10mg  daily - continue. Recent neurology note and labs reviewed with pt/wife.

## 2017-04-28 NOTE — Addendum Note (Signed)
Addended by: Modena Nunnery on: 04/28/2017 02:09 PM   Modules accepted: Orders

## 2017-04-28 NOTE — Addendum Note (Signed)
Addended by: Ria Bush on: 04/28/2017 01:24 PM   Modules accepted: Level of Service

## 2017-04-28 NOTE — Assessment & Plan Note (Signed)
Check UCx for recurrent dysuria after doxy 7d course earlier this month.

## 2017-04-28 NOTE — Assessment & Plan Note (Addendum)
Recently completed 7d doxy treatment by urology for UTI with what sounds like UCx growing MBM. Recurrent dysuria - UA/micro today unclear - will await UCx. Pt/wife agree with plan. UA done on clean catch collected at home. New sterile urine specimen container provided today

## 2017-04-28 NOTE — Progress Notes (Signed)
BP 112/60   Pulse 88   Temp 98.4 F (36.9 C) (Oral)   Wt 166 lb 8 oz (75.5 kg)   SpO2 92%   BMI 28.58 kg/m    CC: 60mo f/u visit Subjective:    Patient ID: [name], male    DOB: 06-23-1931, 83 y.o.   MRN: [id number]  HPI: [name] is a 83 y.o. male presenting on 04/28/2017 for Follow-up   We started namenda 2 months ago for progressively worsening memory trouble. H/o skin rash with aricept. Saw neurology after sudden deterioration in mental status 03/02/2017 with unrevealing brain MRI. labwork was also unrevealing. Planned f/u at end of year. Worsening apraxia noted.   Still awaiting on results of disability application through [place]. Now has [place] card and [facility]. Planning on establishing with [place] PCP. Saw audiologist who recommended hearing aides - very expensive. Planning on checking through [place].   Ongoing dysuria - saw urology (Dr Jeffie Pollock) early last month - treated with doxycycline 7d course. Sounds like UCx grew mixed multiple bacterial morphotypes. Planned f/u with urology next week. Doxycycline helped resolve symptoms. Azo resolved foul smell. He has increased fluid intake.   Dysuria has returned over last few days. Ongoing chronic urinary incontinence - brings clean catch.   INR check today 3.1. See coumadin visit.   H/o recurrent UTI, h/o overactive bladder and urge incontinence, latest saw urology 10/2016. rec treat only with confirmed UTI by UCx. Continues PTNS Q3-4 wks which seems to help.   Relevant past medical, surgical, family and social history reviewed and updated as indicated. Interim medical history since our last visit reviewed. Allergies and medications reviewed and updated. Outpatient Medications Prior to Visit  Medication Sig Dispense Refill  . acetaminophen (TYLENOL) 325 MG tablet Take 325-650 mg by mouth every 6 (six) hours as needed (for hip pain).     Marland Kitchen albuterol (PROVENTIL HFA;VENTOLIN HFA) 108 (90 Base) MCG/ACT inhaler Inhale  2 puffs into the lungs every 6 (six) hours as needed for wheezing or shortness of breath. 3 Inhaler 3  . b complex vitamins tablet Take 1 tablet by mouth daily.    . Biotin 2500 MCG CAPS Take 1 capsule by mouth daily.    . Cholecalciferol (VITAMIN D) 2000 UNITS CAPS Take 2,000 Units by mouth daily. Reported on 01/30/2016    . clotrimazole (LOTRIMIN) 1 % cream Apply 1 application topically 2 (two) times daily. (Patient taking differently: Apply 1 application topically 2 (two) times daily as needed (for rash). ) 30 g 0  . Coenzyme Q10 (COQ-10) 100 MG CAPS Take 100 mg by mouth daily.     . Cyanocobalamin (B-12 PO) Take 10,000 mcg by mouth 3 (three) times a week.     . DEXILANT 60 MG capsule TAKE 1 CAPSULE DAILY (Patient taking differently: Take 60 mg by mouth once a day) 90 capsule 3  . Dextran 70-Hypromellose, PF, (ARTIFICIAL TEARS PF) 0.1-0.3 % SOLN Place 1-2 drops into both eyes 3 (three) times daily as needed (for dry eyes).     Marland Kitchen docusate sodium (COLACE) 100 MG capsule Take 100 mg by mouth at bedtime.     . fenofibrate (TRICOR) 145 MG tablet Take 1 tablet (145 mg total) by mouth daily. 90 tablet 3  . fexofenadine (ALLEGRA) 180 MG tablet Take 180 mg by mouth daily as needed for allergies.     . memantine (NAMENDA) 5 MG tablet Take 5mg  daily for 1 week then  increase to 10mg  daily 60 tablet 3  . Multiple Vitamins-Minerals (MULTIVITAMIN PO) Take 1 tablet by mouth daily.     . mupirocin ointment (BACTROBAN) 2 %     . simvastatin (ZOCOR) 40 MG tablet Take 1 tablet (40 mg total) by mouth daily. 90 tablet 3  . venlafaxine (EFFEXOR) 75 MG tablet TAKE ONE TABLET TWICE DAILY WITH MEALS 60 tablet 6  . warfarin (COUMADIN) 2.5 MG tablet TAKE ONE (1) TABLET EACH DAY 90 tablet 1   Facility-Administered Medications Prior to Visit  Medication Dose Route Frequency Provider Last Rate Last Dose  . 0.9 %  sodium chloride infusion  500 mL Intravenous Continuous Milus Banister, MD         Per HPI unless  specifically indicated in ROS section below Review of Systems     Objective:    BP 112/60   Pulse 88   Temp 98.4 F (36.9 C) (Oral)   Wt 166 lb 8 oz (75.5 kg)   SpO2 92%   BMI 28.58 kg/m   Wt Readings from Last 3 Encounters:  04/28/17 166 lb 8 oz (75.5 kg)  03/24/17 171 lb 12 oz (77.9 kg)  03/16/17 172 lb (78 kg)    Physical Exam  Constitutional: He appears well-developed and well-nourished. No distress.  HENT:  Mouth/Throat: Oropharynx is clear and moist. No oropharyngeal exudate.  Cardiovascular: Normal rate, regular rhythm, normal heart sounds and intact distal pulses.   No murmur heard. Pulmonary/Chest: Effort normal and breath sounds normal. No respiratory distress. He has no wheezes. He has no rales.  Abdominal: Soft. Bowel sounds are normal. He exhibits no distension and no mass. There is tenderness (mild) in the suprapubic area. There is no guarding.  Musculoskeletal: He exhibits no edema.  Skin: Skin is warm and dry. No rash noted.  Nursing note and vitals reviewed.  Results for orders placed or performed in visit on 04/28/17  POCT INR  Result Value Ref Range   INR 3.1    Micro: WBC 5-10 RBC rare Epi few Bact tr UCx sent    Assessment & Plan:   Problem List Items Addressed This Visit    Dementia    Stable period, tolerating namenda 10mg  daily - continue. Recent neurology note and labs reviewed with pt/wife.       Dysuria - Primary    Recently completed 7d doxy treatment by urology for UTI with what sounds like UCx growing MBM. Recurrent dysuria - UA/micro today unclear - will await UCx. Pt/wife agree with plan. UA done on clean catch collected at home. New sterile urine specimen container provided today       Relevant Orders   Urine Culture   Recurrent UTI    Check UCx for recurrent dysuria after doxy 7d course earlier this month.       Relevant Orders   Urine Culture   Urge incontinence    Ongoing, sees urology Dr Jeffie Pollock - continues PTNS. Off  myrbetriq (unaffordable).           Follow up plan: Return in about 3 months (around 07/29/2017) for follow up visit.  Ria Bush, MD

## 2017-04-28 NOTE — Assessment & Plan Note (Addendum)
Ongoing, sees urology Dr Jeffie Pollock - continues PTNS. Off myrbetriq (unaffordable).

## 2017-04-30 ENCOUNTER — Encounter: Payer: Self-pay | Admitting: Family Medicine

## 2017-04-30 ENCOUNTER — Telehealth: Payer: Self-pay | Admitting: Family Medicine

## 2017-04-30 DIAGNOSIS — N3941 Urge incontinence: Secondary | ICD-10-CM | POA: Diagnosis not present

## 2017-04-30 LAB — URINE CULTURE

## 2017-04-30 NOTE — Telephone Encounter (Signed)
See result note.  

## 2017-04-30 NOTE — Telephone Encounter (Signed)
Patient's wife,Kitty,called to find out the results of patient's urine culture.

## 2017-05-06 DIAGNOSIS — N3941 Urge incontinence: Secondary | ICD-10-CM | POA: Diagnosis not present

## 2017-05-06 DIAGNOSIS — R3914 Feeling of incomplete bladder emptying: Secondary | ICD-10-CM | POA: Diagnosis not present

## 2017-05-06 DIAGNOSIS — B964 Proteus (mirabilis) (morganii) as the cause of diseases classified elsewhere: Secondary | ICD-10-CM | POA: Diagnosis not present

## 2017-05-06 DIAGNOSIS — N390 Urinary tract infection, site not specified: Secondary | ICD-10-CM | POA: Diagnosis not present

## 2017-05-12 ENCOUNTER — Ambulatory Visit (INDEPENDENT_AMBULATORY_CARE_PROVIDER_SITE_OTHER): Payer: Medicare Other

## 2017-05-12 DIAGNOSIS — I639 Cerebral infarction, unspecified: Secondary | ICD-10-CM | POA: Diagnosis not present

## 2017-05-12 DIAGNOSIS — Z5181 Encounter for therapeutic drug level monitoring: Secondary | ICD-10-CM

## 2017-05-12 LAB — POCT INR: INR: 3.3

## 2017-05-12 NOTE — Patient Instructions (Signed)
Pre visit review using our clinic review tool, if applicable. No additional management support is needed unless otherwise documented below in the visit note. 

## 2017-05-21 DIAGNOSIS — N3941 Urge incontinence: Secondary | ICD-10-CM | POA: Diagnosis not present

## 2017-05-26 ENCOUNTER — Ambulatory Visit (INDEPENDENT_AMBULATORY_CARE_PROVIDER_SITE_OTHER): Payer: Medicare Other

## 2017-05-26 DIAGNOSIS — Z23 Encounter for immunization: Secondary | ICD-10-CM

## 2017-05-26 DIAGNOSIS — Z7901 Long term (current) use of anticoagulants: Secondary | ICD-10-CM

## 2017-05-26 DIAGNOSIS — I639 Cerebral infarction, unspecified: Secondary | ICD-10-CM

## 2017-05-26 LAB — POCT INR: INR: 4

## 2017-05-26 NOTE — Patient Instructions (Signed)
Pre visit review using our clinic review tool, if applicable. No additional management support is needed unless otherwise documented below in the visit note.   INR today 4.0  In review of patient's diet, health and medication history, only changes have been that patient has restarted his vitamin regimen.  Otherwise, patient is doing well, no acute illnesses, abx use or change in diet.  Patient and wife deny any unusual bruising or bleeding but understand to go to ER if any concerns develop as they are aware of risks associated with a supratherapeutic level.    Wife assists patient with medication management and is aware to hold his coumadin today and then restart decreased dosing of 1 pill (2.5mg ) DAILY EXCEPT for 1/2 pill (1.25mg ) on Mondays and Fridays.  Will recheck in 2 weeks.  Copy of written instructions reviewed and given to wife and patient.

## 2017-05-28 ENCOUNTER — Encounter: Payer: Self-pay | Admitting: Family Medicine

## 2017-05-28 ENCOUNTER — Ambulatory Visit (INDEPENDENT_AMBULATORY_CARE_PROVIDER_SITE_OTHER): Payer: Medicare Other | Admitting: Family Medicine

## 2017-05-28 ENCOUNTER — Ambulatory Visit (INDEPENDENT_AMBULATORY_CARE_PROVIDER_SITE_OTHER): Payer: Medicare Other

## 2017-05-28 ENCOUNTER — Telehealth: Payer: Self-pay | Admitting: Family Medicine

## 2017-05-28 VITALS — BP 116/50 | HR 86 | Temp 97.9°F | Ht 64.0 in | Wt 158.0 lb

## 2017-05-28 DIAGNOSIS — N390 Urinary tract infection, site not specified: Secondary | ICD-10-CM | POA: Diagnosis not present

## 2017-05-28 DIAGNOSIS — R300 Dysuria: Secondary | ICD-10-CM

## 2017-05-28 DIAGNOSIS — I639 Cerebral infarction, unspecified: Secondary | ICD-10-CM

## 2017-05-28 LAB — POCT URINALYSIS DIPSTICK
Bilirubin, UA: NEGATIVE
Glucose, UA: NEGATIVE
KETONES UA: NEGATIVE
LEUKOCYTES UA: NEGATIVE
Nitrite, UA: NEGATIVE
PH UA: 6 (ref 5.0–8.0)
PROTEIN UA: POSITIVE
RBC UA: NEGATIVE
SPEC GRAV UA: 1.025 (ref 1.010–1.025)
Urobilinogen, UA: 0.2 E.U./dL

## 2017-05-28 LAB — POCT INR: INR: 2.5

## 2017-05-28 MED ORDER — FLUCONAZOLE 150 MG PO TABS: 150.0000 mg | ORAL_TABLET | Freq: Once | ORAL | 0 refills | Status: AC

## 2017-05-28 NOTE — Telephone Encounter (Signed)
Patient's wife called to confirm.  Dr. Jeffie Pollock will see patient tomorrow 9/21 at 3:00pm to eval further.

## 2017-05-28 NOTE — Progress Notes (Signed)
Subjective:   Patient ID: [name], male    DOB: [birth year], 83 y.o.   MRN: [id number]  [name] is a pleasant 83 y.o. year old male pt of Dr. Darnell Level, new to me, who presents to clinic today with Urinary Tract Infection  on 05/28/2017  HPI:  Chart reviewed. Last saw PCP for dysuria on 03/24/17-  H/o recurrent UTI, h/o overactive bladder and urge incontinence, latest saw urology 10/2016. rec treat only with confirmed UTI by UCx. He did recently complete treatment for PTNS - this may have been helpful.   Urine cx neg on 03/24/17- was not treated with abx.  Urine cx again neg on 04/28/17- advised avoiding bladder irritants and keeping appt with urology (Dr. Roni Bread).  Saw Dr. Roni Bread on 05/06/2017, Just finished course of Keflex two weeks ago.  On Tuesday, in coumadin clinic, INR was 4.  Gross hematuria this morning. Dysuria started yesterday.  Current Outpatient Prescriptions on File Prior to Visit  Medication Sig Dispense Refill  . acetaminophen (TYLENOL) 325 MG tablet Take 325-650 mg by mouth every 6 (six) hours as needed (for hip pain).     Marland Kitchen albuterol (PROVENTIL HFA;VENTOLIN HFA) 108 (90 Base) MCG/ACT inhaler Inhale 2 puffs into the lungs every 6 (six) hours as needed for wheezing or shortness of breath. 3 Inhaler 3  . b complex vitamins tablet Take 1 tablet by mouth daily.    . Biotin 2500 MCG CAPS Take 1 capsule by mouth daily.    . Cholecalciferol (VITAMIN D) 2000 UNITS CAPS Take 2,000 Units by mouth daily. Reported on 01/30/2016    . clotrimazole (LOTRIMIN) 1 % cream Apply 1 application topically 2 (two) times daily. (Patient taking differently: Apply 1 application topically 2 (two) times daily as needed (for rash). ) 30 g 0  . Coenzyme Q10 (COQ-10) 100 MG CAPS Take 100 mg by mouth daily.     . Cyanocobalamin (B-12 PO) Take 10,000 mcg by mouth 3 (three) times a week.     . DEXILANT 60 MG capsule TAKE 1 CAPSULE DAILY (Patient taking differently: Take 60 mg by mouth once a day)  90 capsule 3  . Dextran 70-Hypromellose, PF, (ARTIFICIAL TEARS PF) 0.1-0.3 % SOLN Place 1-2 drops into both eyes 3 (three) times daily as needed (for dry eyes).     Marland Kitchen docusate sodium (COLACE) 100 MG capsule Take 100 mg by mouth at bedtime.     . fenofibrate (TRICOR) 145 MG tablet Take 1 tablet (145 mg total) by mouth daily. 90 tablet 3  . fexofenadine (ALLEGRA) 180 MG tablet Take 180 mg by mouth daily as needed for allergies.     . memantine (NAMENDA) 5 MG tablet Take 5mg  daily for 1 week then increase to 10mg  daily 60 tablet 3  . Multiple Vitamins-Minerals (MULTIVITAMIN PO) Take 1 tablet by mouth daily.     . mupirocin ointment (BACTROBAN) 2 %     . simvastatin (ZOCOR) 40 MG tablet Take 1 tablet (40 mg total) by mouth daily. 90 tablet 3  . venlafaxine (EFFEXOR) 75 MG tablet TAKE ONE TABLET TWICE DAILY WITH MEALS 60 tablet 6  . warfarin (COUMADIN) 2.5 MG tablet TAKE ONE (1) TABLET EACH DAY 90 tablet 1   Current Facility-Administered Medications on File Prior to Visit  Medication Dose Route Frequency Provider Last Rate Last Dose  . 0.9 %  sodium chloride infusion  500 mL Intravenous Continuous Milus Banister, MD        Allergies  Allergen Reactions  . Lactose Intolerance (Gi) Diarrhea  . Sulfa Antibiotics Hives  . Sulfadiazine Hives  . Aricept [Donepezil Hcl] Rash  . Lipitor [Atorvastatin] Rash  . Nizatidine Rash  . Penicillins Rash    Has patient had a PCN reaction causing immediate rash, facial/tongue/throat swelling, SOB or lightheadedness with hypotension: Yes Has patient had a PCN reaction causing severe rash involving mucus membranes or skin necrosis: No Has patient had a PCN reaction that required hospitalization: No Has patient had a PCN reaction occurring within the last 10 years: No If all of the above answers are "NO", then may proceed with Cephalosporin use.     Past Medical History:  Diagnosis Date  . Allergic rhinitis   . Arthritis   . Asthma    remote  . Blood  transfusion 1990's  . CAD (coronary artery disease)    cath 2000 30% single vessel, normal nuclear stress test 12/03/2010, no evidence ischemia  . Candidal urethritis in male 06/15/2015  . CKD (chronic kidney disease) stage 3, GFR 30-59 ml/min    baseline Cr 1.7  . Closed C7 fracture ([place]) 11/06/2014  . DDD (degenerative disc disease) 03/2013   by CT, diffuse multilevel cervical and lumbar spondylosis  . Depression   . DISH (diffuse idiopathic skeletal hyperostosis) 03/2013   lumbar spine on xray  . Dizziness    multifactorial, s/p PT at [facility] 06/2014 with HEP  . DVT (deep venous thrombosis) ([place]) 07/2016  . Elevated PSA    previous-normalized (followed by Dr. Jeffie Pollock, rec no repeat unless urinary sxs)  . Essential tremor 09/27/2008   reviewed eval by Dr Jannifer Franklin in chart 2011   . Gait abnormality 03/16/2017  . GERD (gastroesophageal reflux disease)    h/o PUD  . Hearing loss 06/2012   eval - rec annual exam  . History of fracture of right hip   . History of phlebitis   . History of pyelonephritis 05/2014   hospitalization with sepsis  . HLD (hyperlipidemia)    hypertriglyceridemia  . HSV 03/08/2007   Qualifier: Diagnosis of  By: Council Mechanic MD, Hilaria Ota   . Hx pulmonary embolism 08/1991   negative hypercoagulable panel 12/2014  . Inguinal hernia   . Macrocytic anemia 2014   stable B12/folate and periph smear 05/2013, again periph smear 2017 with mature cells, mild dacrocytosis  . Osteoporosis 11/2013   DEXA T score -2.7  . Prediabetes   . Schatzki's ring 02/2012   s/p dilation [name]  . Sleep apnea    no CPAP- sleep apnea "cleared up 15 years ago"  . Stroke ([place])   . Urge incontinence   . UTI (urinary tract infection) due to Enterococcus 05/13/2015    Past Surgical History:  Procedure Laterality Date  . APPENDECTOMY  1996  . CARDIAC CATHETERIZATION  2000   30% one vessel  . CARDIOVASCULAR STRESS TEST  03/2015   no ischemia, low risk, EF 76%  . CAROTID U/S  12/28/2007   nml  .  CATARACT EXTRACTION  09/2009   bilateral  . COLONOSCOPY  1998   [place] w[place]  . COLONOSCOPY  07/2010   5 polyps, adenomatous, rec rpt 3 yrs  . COLONOSCOPY  04/2014   3 polyps, adenomatous, f/u open ended given age [name]  . CT ABD W & PELVIS WO CM  07/2001   Scarring of right lung, stable negative o/w  . CT ABD W & PELVIS WO CM  11/2000   ? stones, right LL scarring  .  CYSTOSCOPY  1992   for kidney stones  . EEG  11/12/2009   nml  . ESOPHAGOGASTRODUODENOSCOPY  02/2012   dilation of schatzki's ring [name]  . ESOPHAGOGASTRODUODENOSCOPY N/A 11/15/2015   Procedure: ESOPHAGOGASTRODUODENOSCOPY (EGD);  Surgeon: Gatha Mayer, MD;  Location: [facility] ENDOSCOPY;  Service: Endoscopy;  Laterality: N/A;  . ESOPHAGOGASTRODUODENOSCOPY  11/2016   esophageal erosions, GERD, esophageal stenosis dilated [name]  . EXPLORATORY LAPAROTOMY  1996   with incidental appendectomy  . HERNIA REPAIR  1979   Left  . HIP PINNING,CANNULATED Left 03/11/2016   Procedure: CANNULATED HIP PINNING;  Surgeon: Rod Can, MD;  Location: [facility];  Service: Orthopedics;  Laterality: Left;  . KNEE ARTHROSCOPY  03/24/02   Right (Dr. Mauri Pole)  . MRI  11/2009   Head, nml  . ORIF FEMORAL NECK FRACTURE W/ DHS Right 08/03/2002   Dr Mauri Pole  . TONSILLECTOMY  1965  . US ECHOCARDIOGRAPHY  12/28/2007   Mild aortic valve calcification EF 55%, basically nml  . V/Q SCAN  06/1999   negative    Family History  Problem Relation Age of Onset  . Stroke Mother   . Hypertension Mother   . Cancer Brother        prostate with mets  . Blindness Brother        legally  . Diabetes Brother   . Pulmonary embolism Sister        from shoulder operation  . Alcohol abuse Brother   . Colon cancer Neg Hx   . Esophageal cancer Neg Hx   . Rectal cancer Neg Hx   . Stomach cancer Neg Hx     Social History   Social History  . Marital status: Married    Spouse name: N/A  . Number of children: 2  . Years of education: N/A   Occupational  History  . 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 Smoking status: Never Smoker  . Smokeless tobacco: Never Used  . Alcohol use No  . Drug use: No  . Sexual activity: Not Currently   Other Topics Concern  . Not on file   Social History Narrative   Married with 2 children   Husband of Austen Oyster    Retired: was principal    Activity: walks dog 2-3 times daily about 31min, frequent stops    Diet: some water, good fruits/vegetables, fish 1x/wk, no sodas.      Advanced directives: Chauncey Reading is wife, [name]. Does not want prolonged life support if terminal      Patient does not drink caffeine.   Patient is right handed.    The PMH, PSH, Social History, Family History, Medications, and allergies have been reviewed in [facility], and have been updated if relevant.    Review of Systems  Genitourinary: Positive for dysuria, frequency and hematuria. Negative for difficulty urinating, discharge, enuresis, flank pain, genital sores, penile pain, penile swelling, scrotal swelling, testicular pain and urgency.  All other systems reviewed and are negative.      Objective:    BP (!) 116/50   Pulse 86   Temp 97.9 F (36.6 C)   Ht 5\' 4"  (1.626 m)   Wt 158 lb (71.7 kg)   SpO2 97%   BMI 27.12 kg/m    Physical Exam  Constitutional: He is oriented to person, place, and time. He appears well-developed and well-nourished. No distress.  HENT:  Head: Normocephalic and atraumatic.  Eyes: Conjunctivae are normal.  Cardiovascular: Normal rate.   Pulmonary/Chest: Effort normal.  Neurological: He is alert and oriented to person, place, and time. No cranial nerve deficit.  Skin: Skin is warm and dry. He is not diaphoretic.  Psychiatric: He has a normal mood and affect. His behavior is normal. Judgment and thought content normal.  Nursing note and vitals reviewed.         Assessment & Plan:   Dysuria - Plan: POCT urinalysis dipstick  Recurrent UTI -  Plan: POCT urinalysis dipstick No Follow-up on file.

## 2017-05-28 NOTE — Telephone Encounter (Signed)
Caller Name: Sharyn Creamer  Relationship to Patient:wife Best number:[phone] Pharmacy:  Reason for call:  Wife states that pt is having blood seep out of his penis. He was seen today for it. Wife called office after his cvisit and they were sent to [facility] and [place] transferred back, they are refusing to be triaged.  Wife is wanting to know if pt is having a problem with coumadin or can be seen again.  Per TH, the last coumadin was 4

## 2017-05-28 NOTE — Telephone Encounter (Signed)
Spoke with patient's wife.  He continues to have some bright red penile bleeding symptoms throughout the day today.  Wife knows to hold patient's coumadin until bleeding stops and she is bringing him in now for an INR check.    In addition, will look at moving patient's urology appointment up if at all possible.  Currently, he has been set up for next Wednesday 06/03/17.

## 2017-05-28 NOTE — Assessment & Plan Note (Signed)
UA pos for 3+ LE. Will send for cx. No abx for now. The patient and his wife, [name], indicate understanding of these issues and agrees with the plan.

## 2017-05-28 NOTE — Telephone Encounter (Signed)
Also sending to Dr Darnell Level as Juluis Rainier

## 2017-05-28 NOTE — Telephone Encounter (Signed)
Called urology office.  They have taken a message and will discuss with the urologist to see if necessary to move his appointment up from next Wednesday.  They have asked that I fax office note from this am as well to [phone].    Info faxed, will wait for a response.  Dr. Ralene Muskrat office to call patient/wife directly with response.

## 2017-05-28 NOTE — Telephone Encounter (Signed)
Patient seen in coumadin clinic today.  INR 2.5.  Given patient continues to have bright red, penile bleeding through the day today, will have him hold coumadin until bleeding stops.  Dr. Damita Dunnings also consulted today and in approval with plan.  Still waiting for confirmation from Dr. Ralene Muskrat (urologist) office as to whether patient can be seen sooner per our physician recommendation.  Wife aware and will wait for a phone call from urology office for further details.  Thanks.

## 2017-05-28 NOTE — Patient Instructions (Signed)
Pre visit review using our clinic review tool, if applicable. No additional management support is needed unless otherwise documented below in the visit note. 

## 2017-05-28 NOTE — Telephone Encounter (Addendum)
Noted. Saw Dr Deborra Medina today. Agree with awaiting UCx prior to abx.  INR this week was 4 but he should have already held coumadin dose on Tuesday and coumadin dosing was decreased as well.  If urine obstruction symptoms, rec sooner eval with urology  If ongoing bleed, rec hold coumadin until stopped, recheck INR at coumadin clinic.  I believe he has f/u with Dr Jeffie Pollock urology later this month? Will likely need further evaluation of penile bleeding.

## 2017-05-28 NOTE — Telephone Encounter (Signed)
Harlon Flor RN team lead has already addressed in this phone note.FYI to [place].

## 2017-05-28 NOTE — Telephone Encounter (Signed)
PLEASE NOTE: All timestamps contained within this report are represented as [place] Standard Time. CONFIDENTIALTY NOTICE: This fax transmission is intended only for the addressee. It contains information that is legally privileged, confidential or otherwise protected from use or disclosure. If you are not the intended recipient, you are strictly prohibited from reviewing, disclosing, copying using or disseminating any of this information or taking any action in reliance on or regarding this information. If you have received this fax in error, please notify us immediately by telephone so that we can arrange for its return to [place]. Phone: [phone], Toll-Free: [phone], Fax: [phone] Page: 1 of 1 Call Id: [id number] [place] Patient Name: [name] Gender: Male DOB: 09-Feb-1931 Age: 29 Y 10 M 17 D Return Phone Number: [phone] (Primary), [phone] (Secondary) Address: City/State/Zip: [place] [address] Client [facility] - Client Client Site [facility] - Day Physician Ria Bush - MD Contact Type Call Who Is Calling Patient / Member / Family / Caregiver Call Type Triage / Clinical Caller Name Khalen Styer Relationship To Patient Spouse Return Phone Number [phone] (Secondary) Chief Complaint Urine, Blood In Reason for Call Symptomatic / Request for [place] states husband is bleeding after he urinates. He is on Coumadin and has had a recent dose change due to his INR being 4. Appointment Disposition EMR Appointment Not Necessary Info pasted into Epic Yes Translation No Nurse Assessment Guidelines Guideline Title Affirmed Question Affirmed Notes Nurse Date/Time (Eastern Time) Disp. Time Eilene Ghazi Time) Disposition Final User 05/28/2017 1:18:45 PM Clinical Call Yes Corum, RN,  Nicci Comments User: Barrie Folk, RN Date/Time (Eastern Time): 05/28/2017 1:18:34 PM Patient's wife does not want to be triaged. The coumadin clinic nurse has a message to call her back.

## 2017-05-29 DIAGNOSIS — N390 Urinary tract infection, site not specified: Secondary | ICD-10-CM | POA: Diagnosis not present

## 2017-05-29 DIAGNOSIS — R319 Hematuria, unspecified: Secondary | ICD-10-CM | POA: Diagnosis not present

## 2017-05-29 DIAGNOSIS — N200 Calculus of kidney: Secondary | ICD-10-CM | POA: Diagnosis not present

## 2017-05-29 DIAGNOSIS — R3914 Feeling of incomplete bladder emptying: Secondary | ICD-10-CM | POA: Diagnosis not present

## 2017-05-29 DIAGNOSIS — Z87442 Personal history of urinary calculi: Secondary | ICD-10-CM | POA: Diagnosis not present

## 2017-05-29 DIAGNOSIS — N281 Cyst of kidney, acquired: Secondary | ICD-10-CM | POA: Diagnosis not present

## 2017-05-29 NOTE — Telephone Encounter (Signed)
Thanks.  Routed to PCP as FYI.  

## 2017-05-29 NOTE — Progress Notes (Signed)
Agree. Thanks

## 2017-05-31 LAB — URINE CULTURE
MICRO NUMBER:: 81041633
SPECIMEN QUALITY: ADEQUATE

## 2017-06-01 ENCOUNTER — Telehealth: Payer: Self-pay | Admitting: Family Medicine

## 2017-06-01 ENCOUNTER — Encounter: Payer: Self-pay | Admitting: Family Medicine

## 2017-06-01 NOTE — Telephone Encounter (Signed)
Caller Name:Kitty Tomasita Crumble Relationship to Patient:wife Best number:[phone] Pharmacy:  Reason for call:  Wanted to give you an update on Don's coumadin. Please call back

## 2017-06-01 NOTE — Telephone Encounter (Signed)
Spouse called she needs results sent to  [facility] dr Jeffie Pollock

## 2017-06-01 NOTE — Telephone Encounter (Signed)
Spoke with patient's wife.  Review 2nd message string.

## 2017-06-01 NOTE — Telephone Encounter (Signed)
Patient started back on regular dose of coumadin on Saturday but is also taking keflex now for UTI.    Will have patient come back to coumadin clinic for recheck this Friday 06/05/17 for recheck.  Dr. Darnell Level,   Please advise.  Patient is scheduled to have a cystoscopy on 06/10/17.  Should we consider any type of a hold for this procedure especially in light of recent penile bleeding issues (which has resolved)?  But, he would also be a Lovenox bridge candidate so I am not sure how to proceed with this.

## 2017-06-01 NOTE — Telephone Encounter (Signed)
Caller Name:[name] Relationship to Patient:wife Best number:[phone] Pharmacy:  Reason for call: calling about urine culture

## 2017-06-01 NOTE — Telephone Encounter (Signed)
Spouse call back wanting to talk to you Best number [phone]

## 2017-06-02 NOTE — Telephone Encounter (Signed)
I think should be safe to continue cystoscopy without stopping coumadin but would leave to urology discretion.

## 2017-06-03 NOTE — Telephone Encounter (Signed)
Notified patient's wife.  She will confirm ok not to hold with urologist.

## 2017-06-04 NOTE — Telephone Encounter (Signed)
Faxed ucx lab to Dr. Jeffie Pollock with [facility].

## 2017-06-05 ENCOUNTER — Ambulatory Visit (INDEPENDENT_AMBULATORY_CARE_PROVIDER_SITE_OTHER): Payer: Medicare Other

## 2017-06-05 DIAGNOSIS — I639 Cerebral infarction, unspecified: Secondary | ICD-10-CM | POA: Diagnosis not present

## 2017-06-05 LAB — POCT INR: INR: 1.7

## 2017-06-05 NOTE — Patient Instructions (Signed)
Pre visit review using our clinic review tool, if applicable. No additional management support is needed unless otherwise documented below in the visit note. 

## 2017-06-09 ENCOUNTER — Ambulatory Visit: Payer: Medicare Other

## 2017-06-10 ENCOUNTER — Encounter: Payer: Self-pay | Admitting: Family Medicine

## 2017-06-10 ENCOUNTER — Telehealth: Payer: Self-pay

## 2017-06-10 ENCOUNTER — Ambulatory Visit (INDEPENDENT_AMBULATORY_CARE_PROVIDER_SITE_OTHER): Payer: Medicare Other | Admitting: Family Medicine

## 2017-06-10 VITALS — BP 110/56 | HR 86 | Temp 98.2°F | Wt 169.2 lb

## 2017-06-10 DIAGNOSIS — N3941 Urge incontinence: Secondary | ICD-10-CM | POA: Diagnosis not present

## 2017-06-10 DIAGNOSIS — I639 Cerebral infarction, unspecified: Secondary | ICD-10-CM | POA: Diagnosis not present

## 2017-06-10 DIAGNOSIS — Z7901 Long term (current) use of anticoagulants: Secondary | ICD-10-CM | POA: Diagnosis not present

## 2017-06-10 DIAGNOSIS — N411 Chronic prostatitis: Secondary | ICD-10-CM | POA: Insufficient documentation

## 2017-06-10 DIAGNOSIS — N3001 Acute cystitis with hematuria: Secondary | ICD-10-CM | POA: Diagnosis not present

## 2017-06-10 MED ORDER — NYSTATIN 100000 UNIT/GM EX CREA: 1.0000 "application " | TOPICAL_CREAM | Freq: Two times a day (BID) | CUTANEOUS | 0 refills | Status: DC

## 2017-06-10 NOTE — Patient Instructions (Signed)
You did have discomfort on pushing of the prostate - possible prostate infection - we will touch base with Dr Jeffie Pollock about antibiotic choice/duration.  Use barrier cream around bottom for irritation. No other changes for now.

## 2017-06-10 NOTE — Telephone Encounter (Addendum)
plz send today's note attn Dr Jeffie Pollock at [facility] urology - note printed and in [place] box.  plz notify wife - I'd like her to use both desitin and nystatin cream on perianal region. I have sent in nystatin cream.

## 2017-06-10 NOTE — Assessment & Plan Note (Signed)
I think [name] has ongoing prostatitis based on tender boggy prostate on exam today which could explain prolonged UTI sxs. He has been started on keflex 500mg  TID 7d course by urology today. Will forward today's note attn Dr Jeffie Pollock for guidance on UCx results and duration of therapy.  Pt/wife agree with plan.

## 2017-06-10 NOTE — Assessment & Plan Note (Signed)
He will return on Friday with our coumadin nurse for coumadin dosing.

## 2017-06-10 NOTE — Progress Notes (Addendum)
BP (!) 110/56 (BP Location: Left Arm, Patient Position: Sitting, Cuff Size: Normal)   Pulse 86   Temp 98.2 F (36.8 C) (Oral)   Wt 169 lb 4 oz (76.8 kg)   SpO2 98%   BMI 29.05 kg/m    CC: rectal fullness, pocket at anus Subjective:    Patient ID: [name], male    DOB: 1931/05/17, 83 y.o.   MRN: [id number]  HPI: TAEVYN HAUSEN is a 83 y.o. male presenting on 06/10/2017 for Rectal Problems (Feels like something is in anal area when trying to wipe. Noticed about 2 mos ago. )   See recent notes for details. Saw Dr Jeffie Pollock this morning, planned cystoscopy however with persistent UTI concern so was placed on 7d keflex TID course, then planned QHS ppx dosing. Planned f/u with urology later in the month. UCx pending.  Recent supratherapeutic INR held a few days last week until penile bleeding subsided. Current coumadin dose is 2.5mg  daily, twice weekly 1.25mg .   Here today with rectal pressure fullness sensation "feels like there's a pocket near my bottom full of feces." Thinks this has been going on for 3 weeks. No pain or bleeding with wiping or in stool. Regular bowel movements without diarrhea or constipation. Some straining with bowel movement.   Lab Results  Component Value Date   INR 1.7 06/05/2017   INR 2.5 05/28/2017   INR 4.0 05/26/2017     Relevant past medical, surgical, family and social history reviewed and updated as indicated. Interim medical history since our last visit reviewed. Allergies and medications reviewed and updated. Outpatient Medications Prior to Visit  Medication Sig Dispense Refill  . acetaminophen (TYLENOL) 325 MG tablet Take 325-650 mg by mouth every 6 (six) hours as needed (for hip pain).     Marland Kitchen albuterol (PROVENTIL HFA;VENTOLIN HFA) 108 (90 Base) MCG/ACT inhaler Inhale 2 puffs into the lungs every 6 (six) hours as needed for wheezing or shortness of breath. 3 Inhaler 3  . b complex vitamins tablet Take 1 tablet by mouth daily.    . Biotin 2500  MCG CAPS Take 1 capsule by mouth daily.    . Cholecalciferol (VITAMIN D) 2000 UNITS CAPS Take 2,000 Units by mouth daily. Reported on 01/30/2016    . clotrimazole (LOTRIMIN) 1 % cream Apply 1 application topically 2 (two) times daily. (Patient taking differently: Apply 1 application topically 2 (two) times daily as needed (for rash). ) 30 g 0  . Coenzyme Q10 (COQ-10) 100 MG CAPS Take 100 mg by mouth daily.     . Cyanocobalamin (B-12 PO) Take 10,000 mcg by mouth 3 (three) times a week.     . DEXILANT 60 MG capsule TAKE 1 CAPSULE DAILY (Patient taking differently: Take 60 mg by mouth once a day) 90 capsule 3  . Dextran 70-Hypromellose, PF, (ARTIFICIAL TEARS PF) 0.1-0.3 % SOLN Place 1-2 drops into both eyes 3 (three) times daily as needed (for dry eyes).     Marland Kitchen docusate sodium (COLACE) 100 MG capsule Take 100 mg by mouth at bedtime.     . fenofibrate (TRICOR) 145 MG tablet Take 1 tablet (145 mg total) by mouth daily. 90 tablet 3  . fexofenadine (ALLEGRA) 180 MG tablet Take 180 mg by mouth daily as needed for allergies.     . memantine (NAMENDA) 5 MG tablet Take 5mg  daily for 1 week then increase to 10mg  daily 60 tablet 3  . Multiple Vitamins-Minerals (MULTIVITAMIN PO) Take 1 tablet by  mouth daily.     . mupirocin ointment (BACTROBAN) 2 %     . simvastatin (ZOCOR) 40 MG tablet Take 1 tablet (40 mg total) by mouth daily. 90 tablet 3  . venlafaxine (EFFEXOR) 75 MG tablet TAKE ONE TABLET TWICE DAILY WITH MEALS 60 tablet 6  . warfarin (COUMADIN) 2.5 MG tablet TAKE ONE (1) TABLET EACH DAY 90 tablet 1   Facility-Administered Medications Prior to Visit  Medication Dose Route Frequency Provider Last Rate Last Dose  . 0.9 %  sodium chloride infusion  500 mL Intravenous Continuous Milus Banister, MD         Per HPI unless specifically indicated in ROS section below Review of Systems     Objective:    BP (!) 110/56 (BP Location: Left Arm, Patient Position: Sitting, Cuff Size: Normal)   Pulse 86   Temp  98.2 F (36.8 C) (Oral)   Wt 169 lb 4 oz (76.8 kg)   SpO2 98%   BMI 29.05 kg/m   Wt Readings from Last 3 Encounters:  06/10/17 169 lb 4 oz (76.8 kg)  05/28/17 158 lb (71.7 kg)  04/28/17 166 lb 8 oz (75.5 kg)    Physical Exam  Constitutional: He appears well-developed and well-nourished. No distress.  Unsteady on feet. Slowed gait with rolling walker assistance  Genitourinary: Rectal exam shows no external hemorrhoid, no internal hemorrhoid, no fissure, no mass, no tenderness and anal tone normal. Prostate is enlarged and tender.  Genitourinary Comments: Perianal erythema without significant maceration Boggy tender prostate on palpation  Nursing note and vitals reviewed.  Results for orders placed or performed in visit on 06/05/17  POCT INR  Result Value Ref Range   INR 1.7       Assessment & Plan:  For perianal erythema - I recommended start desitin and nystatin cream.   Problem List Items Addressed This Visit    Encounter for current long-term use of anticoagulants    He will return on Friday with our coumadin nurse for coumadin dosing.       Subacute prostatitis - Primary    I think Valon has ongoing prostatitis based on tender boggy prostate on exam today which could explain prolonged UTI sxs. He has been started on keflex 500mg  TID 7d course by urology today. Will forward today's note attn Dr Jeffie Pollock for guidance on UCx results and duration of therapy.  Pt/wife agree with plan.           Follow up plan: No Follow-up on file.  Ria Bush, MD

## 2017-06-10 NOTE — Addendum Note (Signed)
Addended by: Ria Bush on: 06/10/2017 08:03 PM   Modules accepted: Orders

## 2017-06-10 NOTE — Telephone Encounter (Signed)
Patient's wife calls to inform that he did not have cystoscopy today due to another urinary tract infection.  He has been placed back on keflex tid for 1 week and then daily at bedtime.  He did have small amount of bleeding with urine specimen today.    While taking this message, I realize that patient is in office now seeing Dr. Danise Mina.  Discussed current situation with MD and decided to keep on same coumadin dose and recheck patient on Friday 06/12/17.  Wife is to call me in meantime if patient has any further bleeding.

## 2017-06-11 DIAGNOSIS — N3941 Urge incontinence: Secondary | ICD-10-CM | POA: Diagnosis not present

## 2017-06-11 NOTE — Telephone Encounter (Signed)
Faxed note to Dr. Jeffie Pollock.  Left message on vm per dpr relaying instructions and message about rx per Dr. Darnell Level.

## 2017-06-12 ENCOUNTER — Ambulatory Visit (INDEPENDENT_AMBULATORY_CARE_PROVIDER_SITE_OTHER): Payer: Medicare Other

## 2017-06-12 DIAGNOSIS — Z7901 Long term (current) use of anticoagulants: Secondary | ICD-10-CM

## 2017-06-12 DIAGNOSIS — I639 Cerebral infarction, unspecified: Secondary | ICD-10-CM

## 2017-06-12 LAB — POCT INR: INR: 2

## 2017-06-12 NOTE — Patient Instructions (Signed)
Pre visit review using our clinic review tool, if applicable. No additional management support is needed unless otherwise documented below in the visit note. 

## 2017-06-16 ENCOUNTER — Ambulatory Visit (INDEPENDENT_AMBULATORY_CARE_PROVIDER_SITE_OTHER): Payer: Medicare Other | Admitting: General Practice

## 2017-06-16 DIAGNOSIS — I639 Cerebral infarction, unspecified: Secondary | ICD-10-CM

## 2017-06-16 DIAGNOSIS — Z7901 Long term (current) use of anticoagulants: Secondary | ICD-10-CM | POA: Diagnosis not present

## 2017-06-16 LAB — POCT INR: INR: 1.6

## 2017-06-16 NOTE — Patient Instructions (Signed)
Pre visit review using our clinic review tool, if applicable. No additional management support is needed unless otherwise documented below in the visit note. 

## 2017-06-23 ENCOUNTER — Ambulatory Visit: Payer: Medicare Other

## 2017-06-25 DIAGNOSIS — N3941 Urge incontinence: Secondary | ICD-10-CM | POA: Diagnosis not present

## 2017-06-25 DIAGNOSIS — N3021 Other chronic cystitis with hematuria: Secondary | ICD-10-CM | POA: Diagnosis not present

## 2017-06-30 ENCOUNTER — Ambulatory Visit (INDEPENDENT_AMBULATORY_CARE_PROVIDER_SITE_OTHER): Payer: Medicare Other

## 2017-06-30 DIAGNOSIS — I639 Cerebral infarction, unspecified: Secondary | ICD-10-CM

## 2017-06-30 DIAGNOSIS — Z7901 Long term (current) use of anticoagulants: Secondary | ICD-10-CM | POA: Diagnosis not present

## 2017-06-30 LAB — POCT INR: INR: 2.5

## 2017-06-30 NOTE — Patient Instructions (Signed)
Pre visit review using our clinic review tool, if applicable. No additional management support is needed unless otherwise documented below in the visit note. 

## 2017-07-02 DIAGNOSIS — N3941 Urge incontinence: Secondary | ICD-10-CM | POA: Diagnosis not present

## 2017-07-06 ENCOUNTER — Encounter: Payer: Self-pay | Admitting: Family Medicine

## 2017-07-17 ENCOUNTER — Encounter: Payer: Self-pay | Admitting: Adult Health

## 2017-07-21 ENCOUNTER — Ambulatory Visit (INDEPENDENT_AMBULATORY_CARE_PROVIDER_SITE_OTHER): Payer: Medicare Other

## 2017-07-21 DIAGNOSIS — I639 Cerebral infarction, unspecified: Secondary | ICD-10-CM

## 2017-07-21 DIAGNOSIS — Z7901 Long term (current) use of anticoagulants: Secondary | ICD-10-CM

## 2017-07-21 LAB — POCT INR: INR: 2.5

## 2017-07-21 NOTE — Patient Instructions (Signed)
INR today 2.5   Continue taking 1 (2.5 mg) tablet daily.  Patient will continue on with Keflex daily at bedtime.  Will recheck in 4 weeks.  Wife present and verbalizes understanding of instructions given today.

## 2017-07-23 DIAGNOSIS — N3941 Urge incontinence: Secondary | ICD-10-CM | POA: Diagnosis not present

## 2017-07-28 ENCOUNTER — Encounter: Payer: Self-pay | Admitting: Family Medicine

## 2017-07-28 ENCOUNTER — Ambulatory Visit (INDEPENDENT_AMBULATORY_CARE_PROVIDER_SITE_OTHER): Payer: Medicare Other | Admitting: Family Medicine

## 2017-07-28 VITALS — BP 120/60 | HR 77 | Temp 98.0°F | Wt 168.0 lb

## 2017-07-28 DIAGNOSIS — F0390 Unspecified dementia without behavioral disturbance: Secondary | ICD-10-CM

## 2017-07-28 DIAGNOSIS — M6530 Trigger finger, unspecified finger: Secondary | ICD-10-CM

## 2017-07-28 DIAGNOSIS — N390 Urinary tract infection, site not specified: Secondary | ICD-10-CM | POA: Diagnosis not present

## 2017-07-28 DIAGNOSIS — M25552 Pain in left hip: Secondary | ICD-10-CM

## 2017-07-28 DIAGNOSIS — R2681 Unsteadiness on feet: Secondary | ICD-10-CM

## 2017-07-28 DIAGNOSIS — G8929 Other chronic pain: Secondary | ICD-10-CM

## 2017-07-28 DIAGNOSIS — I639 Cerebral infarction, unspecified: Secondary | ICD-10-CM | POA: Diagnosis not present

## 2017-07-28 MED ORDER — ACETAMINOPHEN 500 MG PO TABS: 500.0000 mg | ORAL_TABLET | Freq: Every day | ORAL | Status: AC

## 2017-07-28 NOTE — Progress Notes (Signed)
BP 120/60 (BP Location: Left Arm, Patient Position: Sitting, Cuff Size: Normal)   Pulse 77   Temp 98 F (36.7 C) (Oral)   Wt 168 lb (76.2 kg)   SpO2 99%   BMI 28.84 kg/m    CC: 3 mo f/u visit Subjective:    Patient ID: [name], male    DOB: August 08, 1931, 83 y.o.   MRN: [id number]  HPI: [name] is a 83 y.o. male presenting on 07/28/2017 for 3 mo follow-up   Chronic UTI - on keflex 500mg  once daily ppx. Planned f/u 09/2017 with Dr Jeffie Pollock urologist.   Ongoing chronic left hip pain since surgery 03/2016 - has seen orthopedist [facility]) with normal xrays and CT without contrast. Planning to ask for 2nd opinion at [facility] ortho.   Noticing unrest in sleep since namenda increased to 10mg  daily - although wife ntices this has helped memory   [place] neurology appt scheduled for tomorrow.   Relevant past medical, surgical, family and social history reviewed and updated as indicated. Interim medical history since our last visit reviewed. Allergies and medications reviewed and updated. Outpatient Medications Prior to Visit  Medication Sig Dispense Refill  . albuterol (PROVENTIL HFA;VENTOLIN HFA) 108 (90 Base) MCG/ACT inhaler Inhale 2 puffs into the lungs every 6 (six) hours as needed for wheezing or shortness of breath. 3 Inhaler 3  . b complex vitamins tablet Take 1 tablet by mouth daily.    . Biotin 2500 MCG CAPS Take 1 capsule by mouth daily.    . cephALEXin (KEFLEX) 500 MG capsule Take 500 mg by mouth 3 (three) times daily. Take 3 times a day for 1 week then daily at bedtime until provider changes    . Cholecalciferol (VITAMIN D) 2000 UNITS CAPS Take 2,000 Units by mouth daily. Reported on 01/30/2016    . clotrimazole (LOTRIMIN) 1 % cream Apply 1 application topically 2 (two) times daily. (Patient taking differently: Apply 1 application topically 2 (two) times daily as needed (for rash). ) 30 g 0  . Coenzyme Q10 (COQ-10) 100 MG CAPS Take 100 mg by mouth daily.     . Cyanocobalamin  (B-12 PO) Take 10,000 mcg by mouth 3 (three) times a week.     . DEXILANT 60 MG capsule TAKE 1 CAPSULE DAILY (Patient taking differently: Take 60 mg by mouth once a day) 90 capsule 3  . Dextran 70-Hypromellose, PF, (ARTIFICIAL TEARS PF) 0.1-0.3 % SOLN Place 1-2 drops into both eyes 3 (three) times daily as needed (for dry eyes).     Marland Kitchen docusate sodium (COLACE) 100 MG capsule Take 100 mg by mouth at bedtime.     . fenofibrate (TRICOR) 145 MG tablet Take 1 tablet (145 mg total) by mouth daily. 90 tablet 3  . fexofenadine (ALLEGRA) 180 MG tablet Take 180 mg by mouth daily as needed for allergies.     . memantine (NAMENDA) 5 MG tablet Take 5mg  daily for 1 week then increase to 10mg  daily 60 tablet 3  . Multiple Vitamins-Minerals (MULTIVITAMIN PO) Take 1 tablet by mouth daily.     . mupirocin ointment (BACTROBAN) 2 %     . nystatin cream (MYCOSTATIN) Apply 1 application topically 2 (two) times daily. 30 g 0  . simvastatin (ZOCOR) 40 MG tablet Take 1 tablet (40 mg total) by mouth daily. 90 tablet 3  . venlafaxine (EFFEXOR) 75 MG tablet TAKE ONE TABLET TWICE DAILY WITH MEALS 60 tablet 6  . warfarin (COUMADIN) 2.5 MG tablet  TAKE ONE (1) TABLET EACH DAY 90 tablet 1  . acetaminophen (TYLENOL) 325 MG tablet Take 325-650 mg by mouth every 6 (six) hours as needed (for hip pain).      Facility-Administered Medications Prior to Visit  Medication Dose Route Frequency Provider Last Rate Last Dose  . 0.9 %  sodium chloride infusion  500 mL Intravenous Continuous Milus Banister, MD         Per HPI unless specifically indicated in ROS section below Review of Systems     Objective:    BP 120/60 (BP Location: Left Arm, Patient Position: Sitting, Cuff Size: Normal)   Pulse 77   Temp 98 F (36.7 C) (Oral)   Wt 168 lb (76.2 kg)   SpO2 99%   BMI 28.84 kg/m   Wt Readings from Last 3 Encounters:  07/28/17 168 lb (76.2 kg)  06/10/17 169 lb 4 oz (76.8 kg)  05/28/17 158 lb (71.7 kg)    Physical Exam    Constitutional: He appears well-developed and well-nourished. No distress.  HENT:  Mouth/Throat: Oropharynx is clear and moist. No oropharyngeal exudate.  Cardiovascular: Normal rate, regular rhythm, normal heart sounds and intact distal pulses.  No murmur heard. Pulmonary/Chest: Effort normal and breath sounds normal. No respiratory distress. He has no wheezes. He has no rales.  Musculoskeletal: He exhibits no edema.  Skin: Skin is warm and dry. No rash noted.  Psychiatric: He has a normal mood and affect.  Nursing note and vitals reviewed.  Results for orders placed or performed in visit on 07/21/17  POCT INR  Result Value Ref Range   INR 2.5       Assessment & Plan:   Problem List Items Addressed This Visit    Chronic left hip pain - Primary    [facility] opinion with [facility] Ongoing pain that is really bothering him.  Does not tolerate narcotics well I recommended daily 500mg  tylenol with extra PRN to start, update [place] with effect.      Relevant Medications   acetaminophen (TYLENOL) 500 MG tablet   Dementia    Improvement noted by wife since increase in namenda to 10mg  but possibly contributing to restless sleep at night. Reviewed sleep hygiene measures.       General unsteadiness   Recurrent UTI    On keflex 500mg  daily ppx which is effective - has f/u planned with Dr Jeffie Pollock over next few months.       Trigger finger, acquired    Discussed triggering - they will monitor to see which finger is affected and let me know if desire further eval/treatment.           Follow up plan: Return in about 4 months (around 11/25/2017) for follow up visit.  Ria Bush, MD

## 2017-07-28 NOTE — Assessment & Plan Note (Signed)
Pending 2nd opinion with [facility] Ongoing pain that is really bothering him.  Does not tolerate narcotics well I recommended daily 500mg  tylenol with extra PRN to start, update [place] with effect.

## 2017-07-28 NOTE — Assessment & Plan Note (Signed)
Discussed triggering - they will monitor to see which finger is affected and let me know if desire further eval/treatment.

## 2017-07-28 NOTE — Assessment & Plan Note (Addendum)
On keflex 500mg  daily ppx which is effective - has f/u planned with Dr Jeffie Pollock over next few months.

## 2017-07-28 NOTE — Patient Instructions (Addendum)
You are doing well today.  Continue current medicines.  Return as needed or in 4 months for follow up visit with labs prior. If interested, check with pharmacy about new 2 shot shingles series (shingrix).

## 2017-07-28 NOTE — Assessment & Plan Note (Signed)
Improvement noted by wife since increase in namenda to 10mg  but possibly contributing to restless sleep at night. Reviewed sleep hygiene measures.

## 2017-08-03 DIAGNOSIS — D229 Melanocytic nevi, unspecified: Secondary | ICD-10-CM | POA: Diagnosis not present

## 2017-08-03 DIAGNOSIS — L820 Inflamed seborrheic keratosis: Secondary | ICD-10-CM | POA: Diagnosis not present

## 2017-08-03 DIAGNOSIS — L821 Other seborrheic keratosis: Secondary | ICD-10-CM | POA: Diagnosis not present

## 2017-08-03 DIAGNOSIS — Z1283 Encounter for screening for malignant neoplasm of skin: Secondary | ICD-10-CM | POA: Diagnosis not present

## 2017-08-03 DIAGNOSIS — L719 Rosacea, unspecified: Secondary | ICD-10-CM | POA: Diagnosis not present

## 2017-08-03 DIAGNOSIS — I788 Other diseases of capillaries: Secondary | ICD-10-CM | POA: Diagnosis not present

## 2017-08-03 DIAGNOSIS — L812 Freckles: Secondary | ICD-10-CM | POA: Diagnosis not present

## 2017-08-03 DIAGNOSIS — D692 Other nonthrombocytopenic purpura: Secondary | ICD-10-CM | POA: Diagnosis not present

## 2017-08-03 DIAGNOSIS — L578 Other skin changes due to chronic exposure to nonionizing radiation: Secondary | ICD-10-CM | POA: Diagnosis not present

## 2017-08-03 DIAGNOSIS — D485 Neoplasm of uncertain behavior of skin: Secondary | ICD-10-CM | POA: Diagnosis not present

## 2017-08-03 DIAGNOSIS — D1801 Hemangioma of skin and subcutaneous tissue: Secondary | ICD-10-CM | POA: Diagnosis not present

## 2017-08-10 DIAGNOSIS — H35373 Puckering of macula, bilateral: Secondary | ICD-10-CM | POA: Diagnosis not present

## 2017-08-11 DIAGNOSIS — N3941 Urge incontinence: Secondary | ICD-10-CM | POA: Diagnosis not present

## 2017-08-18 ENCOUNTER — Ambulatory Visit: Payer: Medicare Other

## 2017-08-19 ENCOUNTER — Ambulatory Visit: Payer: Medicare Other | Admitting: Adult Health

## 2017-08-20 ENCOUNTER — Ambulatory Visit (INDEPENDENT_AMBULATORY_CARE_PROVIDER_SITE_OTHER): Payer: Medicare Other | Admitting: General Practice

## 2017-08-20 DIAGNOSIS — Z8673 Personal history of transient ischemic attack (TIA), and cerebral infarction without residual deficits: Secondary | ICD-10-CM | POA: Diagnosis not present

## 2017-08-20 DIAGNOSIS — Z7901 Long term (current) use of anticoagulants: Secondary | ICD-10-CM | POA: Diagnosis not present

## 2017-08-20 LAB — POCT INR: INR: 1.7

## 2017-08-20 NOTE — Patient Instructions (Addendum)
Pre visit review using our clinic review tool, if applicable. No additional management support is needed unless otherwise documented below in the visit note.  Take 2 tablets today (12/13) and then continue taking 1 (2.5 mg) tablet daily.  Patient will continue on with Keflex daily at bedtime.  Will recheck in 4 weeks.

## 2017-08-21 ENCOUNTER — Encounter: Payer: Self-pay | Admitting: Family Medicine

## 2017-08-21 ENCOUNTER — Ambulatory Visit (INDEPENDENT_AMBULATORY_CARE_PROVIDER_SITE_OTHER): Payer: Medicare Other | Admitting: Family Medicine

## 2017-08-21 VITALS — BP 120/60 | HR 97 | Temp 97.9°F | Wt 170.0 lb

## 2017-08-21 DIAGNOSIS — F0390 Unspecified dementia without behavioral disturbance: Secondary | ICD-10-CM | POA: Diagnosis not present

## 2017-08-21 DIAGNOSIS — Z7901 Long term (current) use of anticoagulants: Secondary | ICD-10-CM | POA: Diagnosis not present

## 2017-08-21 DIAGNOSIS — N453 Epididymo-orchitis: Secondary | ICD-10-CM | POA: Diagnosis not present

## 2017-08-21 DIAGNOSIS — I639 Cerebral infarction, unspecified: Secondary | ICD-10-CM

## 2017-08-21 HISTORY — DX: Epididymo-orchitis: N45.3

## 2017-08-21 LAB — POC URINALSYSI DIPSTICK (AUTOMATED)
BILIRUBIN UA: NEGATIVE
Blood, UA: NEGATIVE
Glucose, UA: NEGATIVE
KETONES UA: NEGATIVE
LEUKOCYTES UA: NEGATIVE
Nitrite, UA: NEGATIVE
PH UA: 6 (ref 5.0–8.0)
SPEC GRAV UA: 1.02 (ref 1.010–1.025)
Urobilinogen, UA: 0.2 E.U./dL

## 2017-08-21 MED ORDER — CIPROFLOXACIN HCL 250 MG PO TABS: 250.0000 mg | ORAL_TABLET | Freq: Two times a day (BID) | ORAL | 0 refills | Status: DC

## 2017-08-21 NOTE — Assessment & Plan Note (Addendum)
Yesterday's INR was 1.7. They have not yet taken higher coumadin dose. As we're starting cipro, I asked them to continue current coumadin regimen, with recheck INR in 1 week. I touched base with Mandy our coumadin nurse.

## 2017-08-21 NOTE — Assessment & Plan Note (Signed)
Discussed monitoring for rash when he starts rivastigmine given aricept allergy.

## 2017-08-21 NOTE — Progress Notes (Signed)
BP 120/60 (BP Location: Left Arm, Patient Position: Sitting, Cuff Size: Normal)   Pulse 97   Temp 97.9 F (36.6 C) (Oral)   Wt 170 lb (77.1 kg)   SpO2 96%   BMI 29.18 kg/m    CC: penile pain Subjective:    Patient ID: [name], male    DOB: Apr 08, 1931, 83 y.o.   MRN: [id number]  HPI: [name] is a 83 y.o. male presenting on 08/21/2017 for Penis Pain (pain when retracting foreskin for about 1 wk.)   1 wk h/o penile pain - unable to retract foreskin. Unsure about swelling. No urethral discharge, no dysuria or urinary changes.   Already on 500mg  keflex QHS preventatively for chronic cystitis which seems to be working well.  Lab Results  Component Value Date   CREATININE 1.40 (H) 03/02/2017     [facility] planned starting rivastigmine Jan 1st in addition to namenda. H/o aricept allergy (rash).   Relevant past medical, surgical, family and social history reviewed and updated as indicated. Interim medical history since our last visit reviewed. Allergies and medications reviewed and updated. Outpatient Medications Prior to Visit  Medication Sig Dispense Refill  . acetaminophen (TYLENOL) 500 MG tablet Take 1 tablet (500 mg total) by mouth daily. For hip pain, with extra as needed    . albuterol (PROVENTIL HFA;VENTOLIN HFA) 108 (90 Base) MCG/ACT inhaler Inhale 2 puffs into the lungs every 6 (six) hours as needed for wheezing or shortness of breath. 3 Inhaler 3  . b complex vitamins tablet Take 1 tablet by mouth daily.    . Biotin 2500 MCG CAPS Take 1 capsule by mouth daily.    . cephALEXin (KEFLEX) 500 MG capsule Take 500 mg by mouth 3 (three) times daily. Take 3 times a day for 1 week then daily at bedtime until provider changes    . Cholecalciferol (VITAMIN D) 2000 UNITS CAPS Take 2,000 Units by mouth daily. Reported on 01/30/2016    . clotrimazole (LOTRIMIN) 1 % cream Apply 1 application topically 2 (two) times daily. (Patient taking differently: Apply 1 application  topically 2 (two) times daily as needed (for rash). ) 30 g 0  . Coenzyme Q10 (COQ-10) 100 MG CAPS Take 100 mg by mouth daily.     . Cyanocobalamin (B-12 PO) Take 10,000 mcg by mouth 3 (three) times a week.     . DEXILANT 60 MG capsule TAKE 1 CAPSULE DAILY (Patient taking differently: Take 60 mg by mouth once a day) 90 capsule 3  . Dextran 70-Hypromellose, PF, (ARTIFICIAL TEARS PF) 0.1-0.3 % SOLN Place 1-2 drops into both eyes 3 (three) times daily as needed (for dry eyes).     Marland Kitchen docusate sodium (COLACE) 100 MG capsule Take 100 mg by mouth at bedtime.     . fenofibrate (TRICOR) 145 MG tablet Take 1 tablet (145 mg total) by mouth daily. 90 tablet 3  . fexofenadine (ALLEGRA) 180 MG tablet Take 180 mg by mouth daily as needed for allergies.     . memantine (NAMENDA) 5 MG tablet Take 5mg  daily for 1 week then increase to 10mg  daily 60 tablet 3  . Multiple Vitamins-Minerals (MULTIVITAMIN PO) Take 1 tablet by mouth daily.     . mupirocin ointment (BACTROBAN) 2 %     . nystatin cream (MYCOSTATIN) Apply 1 application topically 2 (two) times daily. 30 g 0  . simvastatin (ZOCOR) 40 MG tablet Take 1 tablet (40 mg total) by mouth daily. 90 tablet  3  . venlafaxine (EFFEXOR) 75 MG tablet TAKE ONE TABLET TWICE DAILY WITH MEALS 60 tablet 6  . warfarin (COUMADIN) 2.5 MG tablet TAKE ONE (1) TABLET EACH DAY 90 tablet 1   Facility-Administered Medications Prior to Visit  Medication Dose Route Frequency Provider Last Rate Last Dose  . 0.9 %  sodium chloride infusion  500 mL Intravenous Continuous Milus Banister, MD         Per HPI unless specifically indicated in ROS section below Review of Systems     Objective:    BP 120/60 (BP Location: Left Arm, Patient Position: Sitting, Cuff Size: Normal)   Pulse 97   Temp 97.9 F (36.6 C) (Oral)   Wt 170 lb (77.1 kg)   SpO2 96%   BMI 29.18 kg/m   Wt Readings from Last 3 Encounters:  08/21/17 170 lb (77.1 kg)  07/28/17 168 lb (76.2 kg)  06/10/17 169 lb 4 oz  (76.8 kg)    Physical Exam  Constitutional: He appears well-developed and well-nourished. No distress.  Genitourinary: Penis normal. Right testis shows tenderness. Right testis shows no mass and no swelling. Left testis shows swelling and tenderness. Left testis shows no mass. Uncircumcised. No phimosis, paraphimosis or penile erythema. No discharge found.  Genitourinary Comments: Left greater than right testicular pain and swelling Scrotal erythema  Nursing note and vitals reviewed.  Results for orders placed or performed in visit on 08/21/17  POCT Urinalysis Dipstick (Automated)  Result Value Ref Range   Color, UA yellow    Clarity, UA clear    Glucose, UA negative    Bilirubin, UA negative    Ketones, UA negative    Spec Grav, UA 1.020 1.010 - 1.025   Blood, UA negative    pH, UA 6.0 5.0 - 8.0   Protein, UA trace    Urobilinogen, UA 0.2 0.2 or 1.0 E.U./dL   Nitrite, UA negative    Leukocytes, UA Negative Negative      Assessment & Plan:   Problem List Items Addressed This Visit    Dementia    Discussed monitoring for rash when he starts rivastigmine given aricept allergy.       Encounter for current long-term use of anticoagulants    Yesterday's INR was 1.7. They have not yet taken higher coumadin dose. As we're starting cipro, I asked them to continue current coumadin regimen, with recheck INR in 1 week. I touched base with Mandy our coumadin nurse.       Epididymoorchitis - Primary    Penile exam overall benign - no signs of phimosis or balanoposthitis. Exam today more consistent with orchitis. UA today normal. UCx sent. Will treat with cipro 250mg  (renally dosed) bid 7d course and hold keflex while on cipro. Update if not improving with treatment.      Relevant Orders   Urine Culture   POCT Urinalysis Dipstick (Automated) (Completed)       Follow up plan: No Follow-up on file.  Ria Bush, MD

## 2017-08-21 NOTE — Patient Instructions (Addendum)
Urinalysis and culture today.  I think you may have orchitis/epididymitis.  Stop keflex and start cipro 250mg  twice daily for 1 week.  Drink good amounts of water.  Don't take extra coumadin dose today - just your regular coumadin dose. Recheck coumadin in 1 week - see Leafy Ro on your way out today.

## 2017-08-21 NOTE — Assessment & Plan Note (Addendum)
Penile exam overall benign - no signs of phimosis or balanoposthitis. Exam today more consistent with orchitis. UA today normal. UCx sent. Will treat with cipro 250mg  (renally dosed) bid 7d course and hold keflex while on cipro. Update if not improving with treatment.

## 2017-08-22 LAB — URINE CULTURE
MICRO NUMBER:: 81408468
SPECIMEN QUALITY: ADEQUATE

## 2017-08-25 ENCOUNTER — Telehealth: Payer: Self-pay | Admitting: Family Medicine

## 2017-08-25 NOTE — Telephone Encounter (Signed)
Copied from [id number] [phone]. Topic: Appointment Scheduling - Scheduling Inquiry for Clinic >> Aug 25, 2017  8:19 AM Cleaster Corin, NT wrote: CRM for notification. See Telephone encounter for:   08/25/17.  Pt. Wife Mrs. Oesterle calling to reschedule appt. Thursday for pt. But would like to speak with [facility] before doing so. Something has came up and she needs to speak with Russella Dar. With the coumadin clinic as soon as she is available. Pt. Wife can be reached at [phone]

## 2017-08-25 NOTE — Telephone Encounter (Signed)
Okay to reschedule appt.  Patient is currently set up for 330 which is okay for patient's wife.  appt confirmed.  Thanks.

## 2017-08-27 ENCOUNTER — Ambulatory Visit (INDEPENDENT_AMBULATORY_CARE_PROVIDER_SITE_OTHER): Payer: Medicare Other | Admitting: General Practice

## 2017-08-27 ENCOUNTER — Ambulatory Visit: Payer: Medicare Other

## 2017-08-27 DIAGNOSIS — Z7901 Long term (current) use of anticoagulants: Secondary | ICD-10-CM | POA: Diagnosis not present

## 2017-08-27 DIAGNOSIS — Z8673 Personal history of transient ischemic attack (TIA), and cerebral infarction without residual deficits: Secondary | ICD-10-CM | POA: Diagnosis not present

## 2017-08-27 LAB — POCT INR: INR: 1.8

## 2017-08-27 NOTE — Patient Instructions (Addendum)
Pre visit review using our clinic review tool, if applicable. No additional management support is needed unless otherwise documented below in the visit note.  Note left for Dr. Danise Mina about patient starting back on Keflex.  INR today was 1.8 and patient's wife does not want to boost today but wants to continue the 2.5 mg.  Please address tomorrow, Friday, 12/21 and call patient with instructions.  ADDENDUM 08/28/17:  Patient is to resume taking prophylactic Keflex qhs. And take coumadin 1.5 pills (3.75mg ) today 12/21 for a boost and then resume taking 2.5 mg daily.  Recheck in 2 weeks.  Dr. Darnell Level aware and approves of above dosing plan.

## 2017-08-28 ENCOUNTER — Telehealth: Payer: Self-pay | Admitting: Family Medicine

## 2017-08-28 NOTE — Telephone Encounter (Signed)
This Probation officer called patient's wife and was able to reach on cell phone.  Please refer to coag encounter note as this details our conversation regarding her husband's coumadin.   Thanks.

## 2017-08-28 NOTE — Telephone Encounter (Signed)
Copied from [phone]. Topic: Quick Communication - Office Called Patient >> Aug 28, 2017  9:55 AM Robina Ade, Helene Kelp D wrote: Reason for CRM: Patient wife called and was returning office call. Please call patient back, thanks.

## 2017-09-10 DIAGNOSIS — N3941 Urge incontinence: Secondary | ICD-10-CM | POA: Diagnosis not present

## 2017-09-15 ENCOUNTER — Ambulatory Visit (INDEPENDENT_AMBULATORY_CARE_PROVIDER_SITE_OTHER): Payer: Medicare Other

## 2017-09-15 DIAGNOSIS — Z8673 Personal history of transient ischemic attack (TIA), and cerebral infarction without residual deficits: Secondary | ICD-10-CM | POA: Diagnosis not present

## 2017-09-15 DIAGNOSIS — Z7901 Long term (current) use of anticoagulants: Secondary | ICD-10-CM | POA: Diagnosis not present

## 2017-09-15 LAB — POCT INR: INR: 2.2

## 2017-09-15 NOTE — Patient Instructions (Signed)
INR today 2.2  Continue taking (1pill) 2.5mg  daily.  Recheck in 4 weeks.  Patient is doing well without any changes in diet, health or medications.  Wife present and verbalizes understanding of instructions given today.

## 2017-09-17 ENCOUNTER — Ambulatory Visit: Payer: Medicare Other

## 2017-09-21 DIAGNOSIS — N3941 Urge incontinence: Secondary | ICD-10-CM | POA: Diagnosis not present

## 2017-09-21 DIAGNOSIS — N3021 Other chronic cystitis with hematuria: Secondary | ICD-10-CM | POA: Diagnosis not present

## 2017-10-13 ENCOUNTER — Ambulatory Visit: Payer: Medicare Other

## 2017-10-15 ENCOUNTER — Ambulatory Visit (INDEPENDENT_AMBULATORY_CARE_PROVIDER_SITE_OTHER): Payer: Medicare Other | Admitting: General Practice

## 2017-10-15 DIAGNOSIS — Z8673 Personal history of transient ischemic attack (TIA), and cerebral infarction without residual deficits: Secondary | ICD-10-CM | POA: Diagnosis not present

## 2017-10-15 DIAGNOSIS — Z7901 Long term (current) use of anticoagulants: Secondary | ICD-10-CM

## 2017-10-15 DIAGNOSIS — N3941 Urge incontinence: Secondary | ICD-10-CM | POA: Diagnosis not present

## 2017-10-15 LAB — POCT INR: INR: 2.1

## 2017-10-15 NOTE — Patient Instructions (Addendum)
Pre visit review using our clinic review tool, if applicable. No additional management support is needed unless otherwise documented below in the visit note.  Continue to take 2.5 mg daily. Re-check in 4 weeks.  

## 2017-10-16 ENCOUNTER — Other Ambulatory Visit: Payer: Self-pay | Admitting: Family Medicine

## 2017-10-19 ENCOUNTER — Telehealth: Payer: Self-pay | Admitting: Gastroenterology

## 2017-10-19 NOTE — Telephone Encounter (Signed)
Dr [name] the pt is having a recurrence of dysphagia.  Do you want him to see you in the office to discuss (you are booking into April) or direct EGD.  The pt is on coumadin, please advise.   [phone] wife cell home number for messages

## 2017-10-19 NOTE — Telephone Encounter (Signed)
Patient is compliant with coumadin management, will refill x 6mths.  

## 2017-10-20 NOTE — Telephone Encounter (Signed)
Patient wife calling about this again.

## 2017-10-21 ENCOUNTER — Other Ambulatory Visit: Payer: Self-pay

## 2017-10-21 ENCOUNTER — Encounter (HOSPITAL_COMMUNITY): Payer: Self-pay | Admitting: *Deleted

## 2017-10-21 DIAGNOSIS — R1310 Dysphagia, unspecified: Secondary | ICD-10-CM

## 2017-10-21 NOTE — Telephone Encounter (Signed)
Dr [name] the pt's wife has called back to see what the pt needs to do.  EGD or office visit?  He is on coumadin

## 2017-10-21 NOTE — Telephone Encounter (Signed)
Sorry for the delay.  EGD with balloon dilation, WL, MAC, next available; needs to hold coumadin for 5 days, need OK from the prescribing provider.  Thanks

## 2017-10-21 NOTE — Telephone Encounter (Signed)
The pt has been scheduled for EGD and a letter sent to PCP for coumadin hold.  The pt has been advised and instructed and the instructions mailed to the home

## 2017-10-22 ENCOUNTER — Telehealth: Payer: Self-pay

## 2017-10-22 NOTE — Telephone Encounter (Signed)
[place] called Dr Gutierrez's office and an urgent call was sent to him regarding coumadin hold.  We will need to let the pt know by Friday.  He will need to stop on 2/16.

## 2017-10-22 NOTE — Telephone Encounter (Signed)
Copied from [place]. Topic: General - Other >> Oct 22, 2017 10:18 AM Yvette Rack wrote: Reason for CRM: Patty from [facility] is calling stating that they has sent over a fax yesterday for patient to stop his coumadin for his procedure on Thursday 21,2019 pt need to stop it by the 16th of Feb but the nurse need the paper faxed back today so that they can call pt and tell him to stop the medicine

## 2017-10-22 NOTE — Telephone Encounter (Signed)
-----   Message from Jeoffrey Massed, RN sent at 10/21/2017  9:59 AM EST ----- Waiting for coumadin hold 2/21 procedure

## 2017-10-22 NOTE — Telephone Encounter (Signed)
Spoke with patient's wife and did confirm procedure for 10/29/17.  Will discuss with PCP and GI tomorrow as we will typically want patient on a lovenox bridge while having the 5 day hold.    Patient's wife aware that I will be back in touch with her tomorrow with final details.

## 2017-10-23 MED ORDER — ENOXAPARIN SODIUM 80 MG/0.8ML ~~LOC~~ SOLN: 80.0000 mg | SUBCUTANEOUS | 0 refills | Status: DC

## 2017-10-23 NOTE — Telephone Encounter (Signed)
Thanks, I will forward this to [place] Katina Degree, RN) for order approval.  We did not see the fax order from earlier message but here are plans/orders for 5 day hold and bridge.  Thanks.

## 2017-10-23 NOTE — Telephone Encounter (Signed)
Agree with lovenox bridging. Thank you.  Previous bridge (11/2016) was lovenox 80mg  once daily.

## 2017-10-23 NOTE — Telephone Encounter (Signed)
Agree with this. Thank you [name]

## 2017-10-23 NOTE — Telephone Encounter (Signed)
[name] has contacted the pt's wife to discuss the hold of his anti coagulant.  She will call the pt today with a response.

## 2017-10-23 NOTE — Telephone Encounter (Signed)
Spoke with Jawann Urbani (wife) by phone and gave her dosing plan.  Will also send this via mychart for a written copy of dosing instructions.

## 2017-10-23 NOTE — Telephone Encounter (Signed)
Here is proposed Lovenox bridge plan:  CrCl:  61 (okay to use 1.5mg /kg q24hrs, but given hx of dosing and bleeding risk, will use 1mg /kg q 24hrs) Wt at last visit: 77.1kg - okay for Lovenox 80mg  subcue q am.    10/23/17 - Last dose of coumadin 10/24/17 - No coumadin / No Lovenox 10/25/17 - Start Lovenox 80mg  injection AM 10/26/17 - Lovenox 80mg  AM  10/27/17 - Lovenox 80mg  AM 10/28/17 - Lovenox 80mg  EARLY AM 10/29/17- PROCEDURE - NO LOVENOX/ NO COUMADIN 10/30/17 - Restart Lovenox 80mg  AM                 Restart Coumadin 5mg  PM 10/31/17 - Lovenox 80mg  AM                Coumadin 5mg  PM 11/01/17 - Lovenox 80mg  AM                Coumadin 5mg  PM 11/03/27 - Lovenox 80mg  AM                Coumadin 2.5mg  PM 11/03/17 - RECHECK COUMADIN CLINIC APPT 1030.  Dr. Danise Mina, please advise if okay for dosing plan.  Thanks.

## 2017-10-29 ENCOUNTER — Ambulatory Visit (HOSPITAL_COMMUNITY): Payer: Medicare Other | Admitting: Anesthesiology

## 2017-10-29 ENCOUNTER — Ambulatory Visit (HOSPITAL_COMMUNITY)
Admission: RE | Admit: 2017-10-29 | Discharge: 2017-10-29 | Disposition: A | Discharge status: Home / Self Care | Payer: Medicare Other | Source: Ambulatory Visit | Attending: Gastroenterology | Admitting: Gastroenterology

## 2017-10-29 ENCOUNTER — Encounter (HOSPITAL_COMMUNITY): Payer: Self-pay | Admitting: Gastroenterology

## 2017-10-29 ENCOUNTER — Encounter (HOSPITAL_COMMUNITY)
Admission: RE | Disposition: A | Discharge status: Home / Self Care | Payer: Self-pay | Source: Ambulatory Visit | Attending: Gastroenterology

## 2017-10-29 DIAGNOSIS — Y733 Surgical instruments, materials and gastroenterology and urology devices (including sutures) associated with adverse incidents: Secondary | ICD-10-CM | POA: Diagnosis not present

## 2017-10-29 DIAGNOSIS — Z86711 Personal history of pulmonary embolism: Secondary | ICD-10-CM | POA: Insufficient documentation

## 2017-10-29 DIAGNOSIS — Z79899 Other long term (current) drug therapy: Secondary | ICD-10-CM | POA: Diagnosis not present

## 2017-10-29 DIAGNOSIS — F329 Major depressive disorder, single episode, unspecified: Secondary | ICD-10-CM | POA: Insufficient documentation

## 2017-10-29 DIAGNOSIS — Z882 Allergy status to sulfonamides status: Secondary | ICD-10-CM | POA: Diagnosis not present

## 2017-10-29 DIAGNOSIS — Y838 Other surgical procedures as the cause of abnormal reaction of the patient, or of later complication, without mention of misadventure at the time of the procedure: Secondary | ICD-10-CM | POA: Diagnosis not present

## 2017-10-29 DIAGNOSIS — Z8673 Personal history of transient ischemic attack (TIA), and cerebral infarction without residual deficits: Secondary | ICD-10-CM | POA: Diagnosis not present

## 2017-10-29 DIAGNOSIS — K219 Gastro-esophageal reflux disease without esophagitis: Secondary | ICD-10-CM | POA: Insufficient documentation

## 2017-10-29 DIAGNOSIS — Z880 Allergy status to penicillin: Secondary | ICD-10-CM | POA: Diagnosis not present

## 2017-10-29 DIAGNOSIS — E739 Lactose intolerance, unspecified: Secondary | ICD-10-CM | POA: Insufficient documentation

## 2017-10-29 DIAGNOSIS — Z951 Presence of aortocoronary bypass graft: Secondary | ICD-10-CM | POA: Diagnosis not present

## 2017-10-29 DIAGNOSIS — Z888 Allergy status to other drugs, medicaments and biological substances status: Secondary | ICD-10-CM | POA: Diagnosis not present

## 2017-10-29 DIAGNOSIS — M1990 Unspecified osteoarthritis, unspecified site: Secondary | ICD-10-CM | POA: Diagnosis not present

## 2017-10-29 DIAGNOSIS — I2510 Atherosclerotic heart disease of native coronary artery without angina pectoris: Secondary | ICD-10-CM | POA: Diagnosis not present

## 2017-10-29 DIAGNOSIS — K9181 Other intraoperative complications of digestive system: Secondary | ICD-10-CM | POA: Diagnosis not present

## 2017-10-29 DIAGNOSIS — I739 Peripheral vascular disease, unspecified: Secondary | ICD-10-CM | POA: Insufficient documentation

## 2017-10-29 DIAGNOSIS — K222 Esophageal obstruction: Secondary | ICD-10-CM | POA: Diagnosis not present

## 2017-10-29 DIAGNOSIS — F0390 Unspecified dementia without behavioral disturbance: Secondary | ICD-10-CM | POA: Diagnosis not present

## 2017-10-29 DIAGNOSIS — K228 Other specified diseases of esophagus: Secondary | ICD-10-CM | POA: Diagnosis not present

## 2017-10-29 DIAGNOSIS — R1310 Dysphagia, unspecified: Secondary | ICD-10-CM

## 2017-10-29 DIAGNOSIS — J45909 Unspecified asthma, uncomplicated: Secondary | ICD-10-CM | POA: Diagnosis not present

## 2017-10-29 DIAGNOSIS — E785 Hyperlipidemia, unspecified: Secondary | ICD-10-CM | POA: Diagnosis not present

## 2017-10-29 HISTORY — DX: Unspecified dementia, unspecified severity, without behavioral disturbance, psychotic disturbance, mood disturbance, and anxiety: F03.90

## 2017-10-29 HISTORY — PX: ESOPHAGOGASTRODUODENOSCOPY (EGD) WITH PROPOFOL: SHX5813

## 2017-10-29 SURGERY — ESOPHAGOGASTRODUODENOSCOPY (EGD) WITH PROPOFOL
Anesthesia: Monitor Anesthesia Care

## 2017-10-29 MED ORDER — LACTATED RINGERS IV SOLN
INTRAVENOUS | Status: DC
  Administered 2017-10-29: 1000 mL via INTRAVENOUS

## 2017-10-29 MED ORDER — SODIUM CHLORIDE 0.9 % IV SOLN: INTRAVENOUS | Status: DC

## 2017-10-29 MED ORDER — PROPOFOL 10 MG/ML IV BOLUS
INTRAVENOUS | Status: AC
  Filled 2017-10-29: qty 40

## 2017-10-29 MED ORDER — PROPOFOL 500 MG/50ML IV EMUL
INTRAVENOUS | Status: DC | PRN
  Administered 2017-10-29: 100 ug/kg/min via INTRAVENOUS

## 2017-10-29 MED ORDER — LIDOCAINE 2% (20 MG/ML) 5 ML SYRINGE
INTRAMUSCULAR | Status: DC | PRN
  Administered 2017-10-29: 50 mg via INTRAVENOUS

## 2017-10-29 MED ORDER — PROPOFOL 10 MG/ML IV BOLUS
INTRAVENOUS | Status: DC | PRN
  Administered 2017-10-29: 40 mg via INTRAVENOUS
  Administered 2017-10-29: 20 mg via INTRAVENOUS

## 2017-10-29 SURGICAL SUPPLY — 14 items

## 2017-10-29 NOTE — Op Note (Signed)
[facility] Patient Name: [name] Procedure Date: 10/29/2017 MRN: [id number] Attending MD: Milus Banister , MD Date of Birth: [birth year] CSN: [id number] Age: 84 Admit Type: Outpatient Procedure:                Upper GI endoscopy Indications:              Dysphagia5/2013 barium esophagram suggested a                            slight stricture at the GE junction and he will                            need EGD with dilation for this. However the                            esophagram also suggests laryngeal penetration,                            probable aspiration. the radiologist suggested                            formal modi modified barium swallow study with                            speech therapy. This was done 02/2012: The MBSS                            suggest that the main problem is from the                            esophageal stricture (probably does not also have                            oropharyngeal swallowing problem). EGD 02/2012                            found and dilated Schatki's ring (up to 54mm). This                            helped but only briefly, repeat EGD 07/2012 same                            finding, dilated to 2mm for 2 minutes. This                            helped. 02/2014 dysphagia returned EGD Dr. [name]                            found irregular distal esophagus (dilated to 31mm,                            biopsies no neoplasia). EGD Dr. Carlean Purl 2017 snug  cervical esophagus, smooth stricture distally,                            dilated with 29 Fr Maloney. 2018 dysphagia,                            abnormal B Esophagram and recurrent dysphagia. EGD                            Dr. [name] dilated GE junction to 69mm, repeat                            biopsies of the stenosis showed no sign of cancer Providers:                Milus Banister, MD, Cleda Daub, RN, Laurena Spies, Technician Referring MD:              Medicines:                Monitored Anesthesia Care Complications:            No immediate complications. Estimated blood loss:                            None. Estimated Blood Loss:     Estimated blood loss: none. Procedure:                Pre-Anesthesia Assessment:                           - Prior to the procedure, a History and Physical                            was performed, and patient medications and                            allergies were reviewed. The patient's tolerance of                            previous anesthesia was also reviewed. The risks                            and benefits of the procedure and the sedation                            options and risks were discussed with the patient.                            All questions were answered, and informed consent                            was obtained. Prior Anticoagulants: The patient has  taken Coumadin (warfarin), last dose was 5 days                            prior to procedure. ASA Grade Assessment: III - A                            patient with severe systemic disease. After                            reviewing the risks and benefits, the patient was                            deemed in satisfactory condition to undergo the                            procedure.                           After obtaining informed consent, the endoscope was                            passed under direct vision. Throughout the                            procedure, the patient's blood pressure, pulse, and                            oxygen saturations were monitored continuously. The                            EG-[id number] ([id number]) scope was introduced through the                            mouth, and advanced to the second part of duodenum.                            The upper GI endoscopy was accomplished without                             difficulty. The patient tolerated the procedure                            well. Scope In: Scope Out: Findings:      1. One mild benign-appearing, intrinsic stenosis was found at the       gastroesophageal junction. A TTS dilator was passed through the scope.       Dilation with an 18-19-20 mm balloon dilator was performed to 18 mm.       Following dilation there was the typical superficial tear with self       limited oozing of blood at the site.      2. UGI tract was othewise normal. Impression:               - Moderate Sedation:      N/A- Per Anesthesia Care Recommendation:           -  Patient has a contact number available for                            emergencies. The signs and symptoms of potential                            delayed complications were discussed with the                            patient. Return to normal activities tomorrow.                            Written discharge instructions were provided to the                            patient.                           - Resume previous diet.                           - Continue present medications.                           - OK to resume your coumadin tomorrow. Continue                            lovenox per protocol set out by the coumadin clinic.                           - The right groin pain is NOT due to a hernia,                            probably related to previous lovenox injections at                            the site. I recommend warm compresses, OTC pain                            relievers. Procedure Code(s):        --- Professional ---                           [phone], Esophagogastroduodenoscopy, flexible,                            transoral; with transendoscopic balloon dilation of                            esophagus (less than 30 mm diameter) Diagnosis Code(s):        --- Professional ---                           K22.2, Esophageal obstruction  R13.10, Dysphagia,  unspecified CPT copyright 2016 American Medical Association. All rights reserved. The codes documented in this report are preliminary and upon coder review may  be revised to meet current compliance requirements. Milus Banister, MD 10/29/2017 10:30:30 AM This report has been signed electronically. Number of Addenda: 0

## 2017-10-29 NOTE — Anesthesia Postprocedure Evaluation (Signed)
Anesthesia Post Note  Patient: TREVIAN HAYASHIDA  Procedure(s) Performed: ESOPHAGOGASTRODUODENOSCOPY (EGD) WITH PROPOFOL (N/A )     Patient location during evaluation: PACU Anesthesia Type: MAC Level of consciousness: awake and alert Pain management: pain level controlled Vital Signs Assessment: post-procedure vital signs reviewed and stable Respiratory status: spontaneous breathing, nonlabored ventilation and respiratory function stable Cardiovascular status: stable and blood pressure returned to baseline Postop Assessment: no apparent nausea or vomiting Anesthetic complications: no    Last Vitals:  Vitals:   10/29/17 1030 10/29/17 1040  BP: (!) 102/42 (!) 119/49  Pulse: 71 71  Resp: 14 13  Temp:    SpO2: 98% 99%    Last Pain:  Vitals:   10/29/17 1026  TempSrc: Oral                 Yaden Seith,W. EDMOND

## 2017-10-29 NOTE — Transfer of Care (Signed)
Immediate Anesthesia Transfer of Care Note  Patient: [name]  Procedure(s) Performed: Procedure(s): ESOPHAGOGASTRODUODENOSCOPY (EGD) WITH PROPOFOL (N/A)  Patient Location: PACU  Anesthesia Type:MAC  Level of Consciousness:  sedated, patient cooperative and responds to stimulation  Airway & Oxygen Therapy:Patient Spontanous Breathing and Patient connected to face mask oxgen  Post-op Assessment:  Report given to PACU RN and Post -op Vital signs reviewed and stable  Post vital signs:  Reviewed and stable  Last Vitals:  Vitals:   10/29/17 0940  BP: (!) 125/45  Pulse: 77  Resp: 17  Temp: 36.7 C  SpO2: 387%    Complications: No apparent anesthesia complications

## 2017-10-29 NOTE — Anesthesia Preprocedure Evaluation (Addendum)
Anesthesia Evaluation  Patient identified by MRN, date of birth, ID band Patient awake    Reviewed: Allergy & Precautions, H&P , NPO status , Patient's Chart, lab work & pertinent test results  Airway Mallampati: III  TM Distance: >3 FB Neck ROM: Full    Dental no notable dental hx. (+) Teeth Intact, Dental Advisory Given   Pulmonary asthma , PE   Pulmonary exam normal breath sounds clear to auscultation       Cardiovascular + CAD, + CABG and + Peripheral Vascular Disease   Rhythm:Regular Rate:Normal     Neuro/Psych Depression Dementia CVA    GI/Hepatic Neg liver ROS, GERD  Medicated and Controlled,  Endo/Other  negative endocrine ROS  Renal/GU Renal InsufficiencyRenal disease  negative genitourinary   Musculoskeletal  (+) Arthritis , Osteoarthritis,    Abdominal   Peds  Hematology negative hematology ROS (+) anemia ,   Anesthesia Other Findings   Reproductive/Obstetrics negative OB ROS                            Anesthesia Physical Anesthesia Plan  ASA: III  Anesthesia Plan: MAC   Post-op Pain Management:    Induction: Intravenous  PONV Risk Score and Plan: 1 and Propofol infusion  Airway Management Planned: Nasal Cannula  Additional Equipment:   Intra-op Plan:   Post-operative Plan:   Informed Consent: I have reviewed the patients History and Physical, chart, labs and discussed the procedure including the risks, benefits and alternatives for the proposed anesthesia with the patient or authorized representative who has indicated his/her understanding and acceptance.   Dental advisory given  Plan Discussed with: CRNA  Anesthesia Plan Comments:         Anesthesia Quick Evaluation

## 2017-10-29 NOTE — H&P (Signed)
HPI: This is a man with chrnoic GE junction stricture, benign.  N  Chief complaint is dysphagia  ROS: complete GI ROS as described in HPI, all other review negative.  Constitutional:  No unintentional weight loss   Past Medical History:  Diagnosis Date  . Allergic rhinitis   . Arthritis   . Asthma    remote  . Blood transfusion 1990's  . CAD (coronary artery disease)    cath 2000 30% single vessel, normal nuclear stress test 12/03/2010, no evidence ischemia  . Candidal urethritis in male 06/15/2015  . CKD (chronic kidney disease) stage 3, GFR 30-59 ml/min (HCC)    baseline Cr 1.7  . Closed C7 fracture ([place]) 11/06/2014  . DDD (degenerative disc disease) 03/2013   by CT, diffuse multilevel cervical and lumbar spondylosis  . Dementia   . Depression   . DISH (diffuse idiopathic skeletal hyperostosis) 03/2013   lumbar spine on xray  . Dizziness    multifactorial, s/p PT at [facility] 06/2014 with HEP  . DVT (deep venous thrombosis) ([place]) 07/2016  . Elevated PSA    previous-normalized (followed by Dr. Jeffie Pollock, rec no repeat unless urinary sxs)  . Essential tremor 09/27/2008   reviewed eval by Dr Jannifer Franklin in chart 2011   . Gait abnormality 03/16/2017  . GERD (gastroesophageal reflux disease)    h/o PUD  . Hearing loss 06/2012   eval - rec annual exam  . History of fracture of right hip   . History of phlebitis   . History of pyelonephritis 05/2014   hospitalization with sepsis  . HLD (hyperlipidemia)    hypertriglyceridemia  . HSV 03/08/2007   Qualifier: Diagnosis of  By: Council Mechanic MD, Hilaria Ota   . Hx pulmonary embolism 08/1991   negative hypercoagulable panel 12/2014  . Inguinal hernia   . Macrocytic anemia 2014   stable B12/folate and periph smear 05/2013, again periph smear 2017 with mature cells, mild dacrocytosis  . Osteoporosis 11/2013   DEXA T score -2.7  . Prediabetes   . Schatzki's ring 02/2012   s/p dilation [name]  . Sleep apnea    no CPAP- sleep apnea "cleared up 15  years ago"  . Stroke ([place])   . Urge incontinence   . UTI (urinary tract infection) due to Enterococcus 05/13/2015    Past Surgical History:  Procedure Laterality Date  . APPENDECTOMY  1996  . CARDIAC CATHETERIZATION  2000   30% one vessel  . CARDIOVASCULAR STRESS TEST  03/2015   no ischemia, low risk, EF 76%  . CAROTID U/S  12/28/2007   nml  . CATARACT EXTRACTION  09/2009   bilateral  . COLONOSCOPY  1998   [place] w[place]  . COLONOSCOPY  07/2010   5 polyps, adenomatous, rec rpt 3 yrs  . COLONOSCOPY  04/2014   3 polyps, adenomatous, f/u open ended given age [name]  . CT ABD W & PELVIS WO CM  07/2001   Scarring of right lung, stable negative o/w  . CT ABD W & PELVIS WO CM  11/2000   ? stones, right LL scarring  . CYSTOSCOPY  1992   for kidney stones  . EEG  11/12/2009   nml  . ESOPHAGOGASTRODUODENOSCOPY  02/2012   dilation of schatzki's ring [name]  . ESOPHAGOGASTRODUODENOSCOPY N/A 11/15/2015   Procedure: ESOPHAGOGASTRODUODENOSCOPY (EGD);  Surgeon: Gatha Mayer, MD;  [facility] ENDOSCOPY;  Service: Endoscopy;  Laterality: N/A;  . ESOPHAGOGASTRODUODENOSCOPY  11/2016   esophageal erosions, GERD, esophageal stenosis dilated [name]  .  EXPLORATORY LAPAROTOMY  1996   with incidental appendectomy  . HERNIA REPAIR  1979   Left  . HIP PINNING     right hip  . HIP PINNING,CANNULATED Left 03/11/2016   Procedure: CANNULATED HIP PINNING;  Surgeon: Rod Can, MD;  Location: [facility];  Service: Orthopedics;  Laterality: Left;  . KNEE ARTHROSCOPY  03/24/02   Right (Dr. Mauri Pole)  . MRI  11/2009   Head, nml  . ORIF FEMORAL NECK FRACTURE W/ DHS Right 08/03/2002   Dr Mauri Pole  . TONSILLECTOMY  1965  . US ECHOCARDIOGRAPHY  12/28/2007   Mild aortic valve calcification EF 55%, basically nml  . V/Q SCAN  06/1999   negative    Current Facility-Administered Medications  Medication Dose Route Frequency Provider Last Rate Last Dose  . 0.9 %  sodium chloride infusion   Intravenous Continuous  Milus Banister, MD      . lactated ringers infusion   Intravenous Continuous Milus Banister, MD 10 mL/hr at 10/29/17 0956 1,000 mL at 10/29/17 0956    Allergies as of 10/21/2017 - Review Complete 10/21/2017  Allergen Reaction Noted  . Lactose intolerance (gi) Diarrhea 02/20/2014  . Sulfa antibiotics Hives 03/02/2017  . Sulfadiazine Hives   . Aricept [donepezil hcl] Rash 09/04/2015  . Lipitor [atorvastatin] Rash 09/04/2015  . Nizatidine Rash   . Penicillins Rash     Family History  Problem Relation Age of Onset  . Stroke Mother   . Hypertension Mother   . Cancer Brother        prostate with mets  . Blindness Brother        legally  . Diabetes Brother   . Pulmonary embolism Sister        from shoulder operation  . Alcohol abuse Brother   . Colon cancer Neg Hx   . Esophageal cancer Neg Hx   . Rectal cancer Neg Hx   . Stomach cancer Neg Hx     Social History   Socioeconomic History  . Marital status: Married    Spouse name: Not on file  . Number of children: 2  . Years of education: Not on file  . Highest education level: Not on file  Social Needs  . Financial resource strain: Not on file  . Food insecurity - worry: Not on file  . Food insecurity - inability: Not on file  . Transportation needs - medical: Not on file  . Transportation needs - non-medical: Not on file  Occupational History  . 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 Smoking status: Never Smoker  . Smokeless tobacco: Never Used  Substance and Sexual Activity  . Alcohol use: No  . Drug use: No  . Sexual activity: Not Currently  Other Topics Concern  . Not on file  Social History Narrative   Married with 2 children   Husband of Zyon Rosser    Retired: was principal    Activity: walks dog 2-3 times daily about 24min, frequent stops    Diet: some water, good fruits/vegetables, fish 1x/wk, no sodas.      Advanced directives: Chauncey Reading is wife, [name]. Does not  want prolonged life support if terminal      Patient does not drink caffeine.   Patient is right handed.      Physical Exam: BP (!) 125/45   Pulse 77   Temp 98 F (36.7 C) (Oral)   Resp 17   Ht 5\' 4"  (1.626 m)  Wt 170 lb (77.1 kg)   SpO2 100%   BMI 29.18 kg/m  Constitutional: generally well-appearing Psychiatric: alert and oriented x3 Abdomen: soft, nontender, nondistended, no obvious ascites, no peritoneal signs, normal bowel sounds No peripheral edema noted in lower extremities  Assessment and plan: 84 y.o. male with dysphagia  For EGD, dilation today.  Please see the "Patient Instructions" section for addition details about the plan.  Owens Loffler, MD [facility] 10/29/2017, 9:57 AM

## 2017-10-29 NOTE — Discharge Instructions (Signed)
YOU HAD AN ENDOSCOPIC PROCEDURE TODAY: Refer to the procedure report and other information in the discharge instructions given to you for any specific questions about what was found during the examination. If this information does not answer your questions, please call [place] office at [phone] to clarify.  ° °YOU SHOULD EXPECT: Some feelings of bloating in the abdomen. Passage of more gas than usual. Walking can help get rid of the air that was put into your GI tract during the procedure and reduce the bloating.. ° °DIET: Your first meal following the procedure should be a light meal and then it is ok to progress to your normal diet. A half-sandwich or bowl of soup is an example of a good first meal. Heavy or fried foods are harder to digest and may make you feel nauseous or bloated. Drink plenty of fluids but you should avoid alcoholic beverages for 24 hours. If you had a esophageal dilation, please see attached instructions for diet.   ° °ACTIVITY: Your care partner should take you home directly after the procedure. You should plan to take it easy, moving slowly for the rest of the day. You can resume normal activity the day after the procedure however YOU SHOULD NOT DRIVE, use power tools, machinery or perform tasks that involve climbing or major physical exertion for 24 hours (because of the sedation medicines used during the test).  ° °SYMPTOMS TO REPORT IMMEDIATELY: °A gastroenterologist can be reached at any hour. Please call [phone]  for any of the following symptoms:  ° °Following upper endoscopy (EGD, EUS, ERCP, esophageal dilation) °Vomiting of blood or coffee ground material  °New, significant abdominal pain  °New, significant chest pain or pain under the shoulder blades  °Painful or persistently difficult swallowing  °New shortness of breath  °Black, tarry-looking or red, bloody stools ° °FOLLOW UP:  °If any biopsies were taken you will be contacted by phone or by letter within the next 1-3  weeks. Call [phone]  if you have not heard about the biopsies in 3 weeks.  °Please also call with any specific questions about appointments or follow up tests. ° °

## 2017-10-30 ENCOUNTER — Encounter (HOSPITAL_COMMUNITY): Payer: Self-pay | Admitting: Gastroenterology

## 2017-11-01 ENCOUNTER — Encounter: Payer: Self-pay | Admitting: Family Medicine

## 2017-11-03 ENCOUNTER — Other Ambulatory Visit (INDEPENDENT_AMBULATORY_CARE_PROVIDER_SITE_OTHER): Payer: Medicare Other

## 2017-11-03 DIAGNOSIS — I82442 Acute embolism and thrombosis of left tibial vein: Secondary | ICD-10-CM

## 2017-11-03 DIAGNOSIS — Z7901 Long term (current) use of anticoagulants: Secondary | ICD-10-CM | POA: Diagnosis not present

## 2017-11-03 LAB — POCT INR: INR: 2.4

## 2017-11-05 DIAGNOSIS — N3941 Urge incontinence: Secondary | ICD-10-CM | POA: Diagnosis not present

## 2017-11-12 ENCOUNTER — Ambulatory Visit: Payer: Medicare Other

## 2017-11-12 ENCOUNTER — Ambulatory Visit (INDEPENDENT_AMBULATORY_CARE_PROVIDER_SITE_OTHER): Payer: Medicare Other | Admitting: General Practice

## 2017-11-12 DIAGNOSIS — Z8673 Personal history of transient ischemic attack (TIA), and cerebral infarction without residual deficits: Secondary | ICD-10-CM | POA: Diagnosis not present

## 2017-11-12 DIAGNOSIS — Z7901 Long term (current) use of anticoagulants: Secondary | ICD-10-CM | POA: Diagnosis not present

## 2017-11-12 LAB — POCT INR: INR: 1.7

## 2017-11-12 NOTE — Patient Instructions (Addendum)
Pre visit review using our clinic review tool, if applicable. No additional management support is needed unless otherwise documented below in the visit note.  INR today 1.7 Take extra 1/2 tablet today and then continue taking (1pill) 2.5mg  daily.  Recheck in on 3/20 when you see Dr. Darnell Level.

## 2017-11-18 ENCOUNTER — Other Ambulatory Visit: Payer: Self-pay | Admitting: Family Medicine

## 2017-11-18 DIAGNOSIS — E785 Hyperlipidemia, unspecified: Secondary | ICD-10-CM

## 2017-11-20 ENCOUNTER — Other Ambulatory Visit (INDEPENDENT_AMBULATORY_CARE_PROVIDER_SITE_OTHER): Payer: Medicare Other

## 2017-11-20 DIAGNOSIS — E785 Hyperlipidemia, unspecified: Secondary | ICD-10-CM

## 2017-11-20 LAB — COMPREHENSIVE METABOLIC PANEL
ALBUMIN: 3.9 g/dL (ref 3.5–5.2)
ALK PHOS: 43 U/L (ref 39–117)
ALT: 30 U/L (ref 0–53)
AST: 49 U/L — ABNORMAL HIGH (ref 0–37)
BUN: 29 mg/dL — AB (ref 6–23)
CALCIUM: 9.6 mg/dL (ref 8.4–10.5)
CHLORIDE: 104 meq/L (ref 96–112)
CO2: 29 mEq/L (ref 19–32)
Creatinine, Ser: 1.48 mg/dL (ref 0.40–1.50)
GFR: 48.09 mL/min — ABNORMAL LOW (ref >60.00)
Glucose, Bld: 127 mg/dL — ABNORMAL HIGH (ref 70–99)
POTASSIUM: 4.5 meq/L (ref 3.5–5.1)
SODIUM: 140 meq/L (ref 135–145)
TOTAL PROTEIN: 6.6 g/dL (ref 6.0–8.3)
Total Bilirubin: 1.3 mg/dL — ABNORMAL HIGH (ref 0.2–1.2)

## 2017-11-20 LAB — LIPID PANEL
CHOL/HDL RATIO: 6
CHOLESTEROL: 154 mg/dL (ref 0–200)
HDL: 24 mg/dL — ABNORMAL LOW (ref >39.00)
NonHDL: 129.74
TRIGLYCERIDES: 336 mg/dL — AB (ref 0.0–149.0)
VLDL: 67.2 mg/dL — AB (ref 0.0–40.0)

## 2017-11-20 LAB — LDL CHOLESTEROL, DIRECT: Direct LDL: 89 mg/dL

## 2017-11-25 ENCOUNTER — Ambulatory Visit: Payer: Medicare Other | Admitting: Family Medicine

## 2017-11-26 ENCOUNTER — Ambulatory Visit (INDEPENDENT_AMBULATORY_CARE_PROVIDER_SITE_OTHER): Payer: Medicare Other | Admitting: Family Medicine

## 2017-11-26 ENCOUNTER — Encounter: Payer: Self-pay | Admitting: Family Medicine

## 2017-11-26 VITALS — BP 118/60 | HR 97 | Temp 98.4°F | Wt 172.0 lb

## 2017-11-26 DIAGNOSIS — R269 Unspecified abnormalities of gait and mobility: Secondary | ICD-10-CM | POA: Diagnosis not present

## 2017-11-26 DIAGNOSIS — R2681 Unsteadiness on feet: Secondary | ICD-10-CM | POA: Diagnosis not present

## 2017-11-26 DIAGNOSIS — K222 Esophageal obstruction: Secondary | ICD-10-CM | POA: Diagnosis not present

## 2017-11-26 DIAGNOSIS — E782 Mixed hyperlipidemia: Secondary | ICD-10-CM

## 2017-11-26 DIAGNOSIS — R1031 Right lower quadrant pain: Secondary | ICD-10-CM | POA: Diagnosis not present

## 2017-11-26 DIAGNOSIS — N183 Chronic kidney disease, stage 3 unspecified: Secondary | ICD-10-CM

## 2017-11-26 DIAGNOSIS — F0390 Unspecified dementia without behavioral disturbance: Secondary | ICD-10-CM

## 2017-11-26 NOTE — Assessment & Plan Note (Signed)
Anticipate residual hematoma after lovenox use. Supportive care reviewed.

## 2017-11-26 NOTE — Assessment & Plan Note (Addendum)
Stable period on namenda and rivastigmine. Followed by [facility] neurology. Planned further evaluation for parkinson's

## 2017-11-26 NOTE — Assessment & Plan Note (Addendum)
Pending eval for possible parkinson's by [facility] neurology. Encouraged continued walker use.

## 2017-11-26 NOTE — Progress Notes (Signed)
BP 118/60 (BP Location: Left Arm, Patient Position: Sitting, Cuff Size: Normal)   Pulse 97   Temp 98.4 F (36.9 C) (Oral)   Wt 172 lb (78 kg)   SpO2 98%   BMI 29.52 kg/m    CC: f/u visit 4 months Subjective:    Patient ID: [name], male    DOB: 1931/06/08, 84 y.o.   MRN: [id number]  HPI: [name] is a 84 y.o. male presenting on 11/26/2017 for 4 mo follow-up   Chronic cystitis - sees urology Jeffie Pollock), was on keflex 500mg  preventatively for 3 months, now off this. No recurrent infections.   EGD 10/2017 for ongoing dysphagia s/p esophageal stenosis dilation [name].  Pt worried about scar tissue after lovenox - told he had chronic hematoma.  Takes tylenol 500mg  in the mornings for ongoing L hip pain.  Saw neuro at [facility] last week - after recent addition rivastigmine ?parkinson disease planned brain nuclear medicine test at [place].   Relevant past medical, surgical, family and social history reviewed and updated as indicated. Interim medical history since our last visit reviewed. Allergies and medications reviewed and updated. Outpatient Medications Prior to Visit  Medication Sig Dispense Refill  . acetaminophen (TYLENOL) 500 MG tablet Take 1 tablet (500 mg total) by mouth daily. For hip pain, with extra as needed    . albuterol (PROVENTIL HFA;VENTOLIN HFA) 108 (90 Base) MCG/ACT inhaler Inhale 2 puffs into the lungs every 6 (six) hours as needed for wheezing or shortness of breath. 3 Inhaler 3  . b complex vitamins tablet Take 1 tablet by mouth daily.    . Biotin 2500 MCG CAPS Take 1 capsule by mouth daily.    . Cholecalciferol (VITAMIN D) 2000 UNITS CAPS Take 2,000 Units by mouth daily. Reported on 01/30/2016    . clotrimazole (LOTRIMIN) 1 % cream Apply 1 application topically 2 (two) times daily. (Patient taking differently: Apply 1 application topically 2 (two) times daily as needed (for rash). ) 30 g 0  . Coenzyme Q10 (COQ-10) 200 MG CAPS Take 200 mg by mouth daily.       . Cyanocobalamin (B-12 PO) Take 10,000 mcg by mouth 3 (three) times a week.     . DEXILANT 60 MG capsule TAKE 1 CAPSULE DAILY (Patient taking differently: Take 60 mg by mouth once a day) 90 capsule 3  . Dextran 70-Hypromellose, PF, (ARTIFICIAL TEARS PF) 0.1-0.3 % SOLN Place 1-2 drops into both eyes 3 (three) times daily as needed (for dry eyes).     Marland Kitchen docusate sodium (COLACE) 100 MG capsule Take 100 mg by mouth at bedtime.     . fenofibrate (TRICOR) 145 MG tablet Take 1 tablet (145 mg total) by mouth daily. 90 tablet 3  . fexofenadine (ALLEGRA) 180 MG tablet Take 180 mg by mouth daily.     . memantine (NAMENDA) 10 MG tablet Take 20 mg by mouth at bedtime.    . Multiple Vitamins-Minerals (MULTIVITAMIN PO) Take 1 tablet by mouth daily.     . rivastigmine (EXELON) 3 MG capsule Take 6 mg by mouth 2 (two) times daily.    . simvastatin (ZOCOR) 40 MG tablet Take 1 tablet (40 mg total) by mouth daily. 90 tablet 3  . venlafaxine (EFFEXOR) 75 MG tablet TAKE ONE TABLET TWICE DAILY WITH MEALS 60 tablet 6  . warfarin (COUMADIN) 2.5 MG tablet TAKE ONE (1) TABLET EACH DAY 90 tablet 1  . enoxaparin (LOVENOX) 80 MG/0.8ML injection Inject 0.8 mLs (80  mg total) into the skin daily. 10 Syringe 0   Facility-Administered Medications Prior to Visit  Medication Dose Route Frequency Provider Last Rate Last Dose  . 0.9 %  sodium chloride infusion  500 mL Intravenous Continuous Milus Banister, MD         Per HPI unless specifically indicated in ROS section below Review of Systems     Objective:    BP 118/60 (BP Location: Left Arm, Patient Position: Sitting, Cuff Size: Normal)   Pulse 97   Temp 98.4 F (36.9 C) (Oral)   Wt 172 lb (78 kg)   SpO2 98%   BMI 29.52 kg/m   Wt Readings from Last 3 Encounters:  11/26/17 172 lb (78 kg)  10/29/17 170 lb (77.1 kg)  08/21/17 170 lb (77.1 kg)    Physical Exam  Constitutional: He appears well-developed and well-nourished. No distress.  HENT:  Mouth/Throat:  Oropharynx is clear and moist. No oropharyngeal exudate.  Cardiovascular: Normal rate, regular rhythm, normal heart sounds and intact distal pulses.  No murmur heard. Pulmonary/Chest: Effort normal and breath sounds normal. No respiratory distress. He has no wheezes. He has no rales.  Abdominal:  Indurated skin R lower abdomen at site of prior lovenox injections - persistent tenderness. No bruising or bleeding or erythema  Musculoskeletal: He exhibits no edema.  Neurological:  Tremor present Slowed gait Walks with rolling walker  Nursing note and vitals reviewed.  Results for orders placed or performed in visit on 11/20/17  Comprehensive metabolic panel  Result Value Ref Range   Sodium 140 135 - 145 mEq/L   Potassium 4.5 3.5 - 5.1 mEq/L   Chloride 104 96 - 112 mEq/L   CO2 29 19 - 32 mEq/L   Glucose, Bld 127 (H) 70 - 99 mg/dL   BUN 29 (H) 6 - 23 mg/dL   Creatinine, Ser 1.48 0.40 - 1.50 mg/dL   Total Bilirubin 1.3 (H) 0.2 - 1.2 mg/dL   Alkaline Phosphatase 43 39 - 117 U/L   AST 49 (H) 0 - 37 U/L   ALT 30 0 - 53 U/L   Total Protein 6.6 6.0 - 8.3 g/dL   Albumin 3.9 3.5 - 5.2 g/dL   Calcium 9.6 8.4 - 10.5 mg/dL   GFR 48.09 (L) >60.00 mL/min  Lipid panel  Result Value Ref Range   Cholesterol 154 0 - 200 mg/dL   Triglycerides 336.0 (H) 0.0 - 149.0 mg/dL   HDL 24.00 (L) >39.00 mg/dL   VLDL 67.2 (H) 0.0 - 40.0 mg/dL   Total CHOL/HDL Ratio 6    NonHDL 129.74   LDL cholesterol, direct  Result Value Ref Range   Direct LDL 89.0 mg/dL      Assessment & Plan:   Problem List Items Addressed This Visit    Abdominal wall pain in right lower quadrant    Anticipate residual hematoma after lovenox use. Supportive care reviewed.       Benign esophageal stricture    Significant improvement after latest esophageal dilation 10/2017      CKD (chronic kidney disease) stage 3, GFR 30-59 ml/min (HCC) - Primary    Reviewed with patient chronic stable CKD.       Dementia    Stable period  on namenda and rivastigmine. Followed by [facility] neurology. Planned further evaluation for parkinson's      Gait abnormality   General unsteadiness    Pending eval for possible parkinson's by [facility] neurology. Encouraged continued walker use.  HLD (hyperlipidemia)    Reviewed FLP with patient and wife - encouraged diet changes to improve triglyceride levels. If persistently elevated, consider addition of fish oil or lovaza or vascepa          No orders of the defined types were placed in this encounter.  No orders of the defined types were placed in this encounter.   Follow up plan: No follow-ups on file.  Ria Bush, MD

## 2017-11-26 NOTE — Assessment & Plan Note (Addendum)
Significant improvement after latest esophageal dilation 10/2017

## 2017-11-26 NOTE — Patient Instructions (Addendum)
Return in 3 months for wellness visit with Katha Cabal and follow up with me.  Cholesterol staying elevated - increase fatty fish in the diet. Decrease added sugars, eliminate trans fats, increase fiber and limit alcohol.  All these changes together can drop triglycerides by almost 50%.

## 2017-11-26 NOTE — Assessment & Plan Note (Signed)
Reviewed with patient chronic stable CKD.

## 2017-11-26 NOTE — Assessment & Plan Note (Addendum)
Reviewed FLP with patient and wife - encouraged diet changes to improve triglyceride levels. If persistently elevated, consider addition of fish oil or lovaza or vascepa

## 2017-11-30 ENCOUNTER — Other Ambulatory Visit: Payer: Self-pay | Admitting: Family Medicine

## 2017-11-30 ENCOUNTER — Telehealth: Payer: Self-pay

## 2017-11-30 NOTE — Telephone Encounter (Signed)
Copied from [place] [id number]. Topic: Inquiry >> Nov 30, 2017  8:59 AM Conception Chancy, NT wrote: Patient wife is calling and states when he was in on 11/26/17 that he was supposed to get an INR taken as well. Please contact once the order is placed. She is wanting to bring him in this evening. Please advise.

## 2017-12-01 ENCOUNTER — Ambulatory Visit (INDEPENDENT_AMBULATORY_CARE_PROVIDER_SITE_OTHER): Payer: Medicare Other

## 2017-12-01 DIAGNOSIS — Z7901 Long term (current) use of anticoagulants: Secondary | ICD-10-CM | POA: Diagnosis not present

## 2017-12-01 DIAGNOSIS — Z8673 Personal history of transient ischemic attack (TIA), and cerebral infarction without residual deficits: Secondary | ICD-10-CM | POA: Diagnosis not present

## 2017-12-01 LAB — POCT INR: INR: 2.3

## 2017-12-01 NOTE — Telephone Encounter (Signed)
Patient was contacted yesterday and scheduled to come in today for INR check.  Thanks.

## 2017-12-01 NOTE — Patient Instructions (Signed)
INR today 2.3  Continue taking (1pill) 2.5mg  daily.   Patient's wife present and assists patient with medications.  Wife verbalizes understanding of all instructions given today.

## 2017-12-02 ENCOUNTER — Inpatient Hospital Stay (HOSPITAL_COMMUNITY)
Admission: EM | Admit: 2017-12-02 | Discharge: 2018-01-06 | DRG: 177 | Disposition: E | Payer: Medicare Other | Attending: Family Medicine | Admitting: Family Medicine

## 2017-12-02 ENCOUNTER — Other Ambulatory Visit: Payer: Self-pay

## 2017-12-02 ENCOUNTER — Telehealth: Payer: Self-pay | Admitting: Family Medicine

## 2017-12-02 ENCOUNTER — Emergency Department (HOSPITAL_COMMUNITY): Payer: Medicare Other

## 2017-12-02 ENCOUNTER — Encounter (HOSPITAL_COMMUNITY): Payer: Self-pay | Admitting: Emergency Medicine

## 2017-12-02 DIAGNOSIS — Z66 Do not resuscitate: Secondary | ICD-10-CM | POA: Diagnosis not present

## 2017-12-02 DIAGNOSIS — Z8249 Family history of ischemic heart disease and other diseases of the circulatory system: Secondary | ICD-10-CM

## 2017-12-02 DIAGNOSIS — R7989 Other specified abnormal findings of blood chemistry: Secondary | ICD-10-CM | POA: Diagnosis not present

## 2017-12-02 DIAGNOSIS — R1319 Other dysphagia: Secondary | ICD-10-CM | POA: Diagnosis present

## 2017-12-02 DIAGNOSIS — I213 ST elevation (STEMI) myocardial infarction of unspecified site: Secondary | ICD-10-CM | POA: Diagnosis present

## 2017-12-02 DIAGNOSIS — R748 Abnormal levels of other serum enzymes: Secondary | ICD-10-CM | POA: Diagnosis not present

## 2017-12-02 DIAGNOSIS — N183 Chronic kidney disease, stage 3 unspecified: Secondary | ICD-10-CM | POA: Diagnosis present

## 2017-12-02 DIAGNOSIS — E785 Hyperlipidemia, unspecified: Secondary | ICD-10-CM | POA: Diagnosis present

## 2017-12-02 DIAGNOSIS — Z515 Encounter for palliative care: Secondary | ICD-10-CM | POA: Diagnosis not present

## 2017-12-02 DIAGNOSIS — Z888 Allergy status to other drugs, medicaments and biological substances status: Secondary | ICD-10-CM

## 2017-12-02 DIAGNOSIS — E1122 Type 2 diabetes mellitus with diabetic chronic kidney disease: Secondary | ICD-10-CM | POA: Diagnosis present

## 2017-12-02 DIAGNOSIS — Z882 Allergy status to sulfonamides status: Secondary | ICD-10-CM

## 2017-12-02 DIAGNOSIS — J9601 Acute respiratory failure with hypoxia: Secondary | ICD-10-CM

## 2017-12-02 DIAGNOSIS — F0390 Unspecified dementia without behavioral disturbance: Secondary | ICD-10-CM | POA: Diagnosis not present

## 2017-12-02 DIAGNOSIS — D539 Nutritional anemia, unspecified: Secondary | ICD-10-CM | POA: Diagnosis present

## 2017-12-02 DIAGNOSIS — Z8672 Personal history of thrombophlebitis: Secondary | ICD-10-CM | POA: Diagnosis not present

## 2017-12-02 DIAGNOSIS — Z8042 Family history of malignant neoplasm of prostate: Secondary | ICD-10-CM

## 2017-12-02 DIAGNOSIS — Z9181 History of falling: Secondary | ICD-10-CM

## 2017-12-02 DIAGNOSIS — J181 Lobar pneumonia, unspecified organism: Secondary | ICD-10-CM | POA: Diagnosis present

## 2017-12-02 DIAGNOSIS — Z86718 Personal history of other venous thrombosis and embolism: Secondary | ICD-10-CM

## 2017-12-02 DIAGNOSIS — I5030 Unspecified diastolic (congestive) heart failure: Secondary | ICD-10-CM | POA: Diagnosis present

## 2017-12-02 DIAGNOSIS — R778 Other specified abnormalities of plasma proteins: Secondary | ICD-10-CM

## 2017-12-02 DIAGNOSIS — J189 Pneumonia, unspecified organism: Secondary | ICD-10-CM

## 2017-12-02 DIAGNOSIS — R600 Localized edema: Secondary | ICD-10-CM | POA: Diagnosis not present

## 2017-12-02 DIAGNOSIS — J690 Pneumonitis due to inhalation of food and vomit: Secondary | ICD-10-CM | POA: Diagnosis present

## 2017-12-02 DIAGNOSIS — I130 Hypertensive heart and chronic kidney disease with heart failure and stage 1 through stage 4 chronic kidney disease, or unspecified chronic kidney disease: Secondary | ICD-10-CM | POA: Diagnosis present

## 2017-12-02 DIAGNOSIS — E875 Hyperkalemia: Secondary | ICD-10-CM | POA: Diagnosis not present

## 2017-12-02 DIAGNOSIS — J90 Pleural effusion, not elsewhere classified: Secondary | ICD-10-CM | POA: Diagnosis not present

## 2017-12-02 DIAGNOSIS — I2510 Atherosclerotic heart disease of native coronary artery without angina pectoris: Secondary | ICD-10-CM | POA: Diagnosis present

## 2017-12-02 DIAGNOSIS — D631 Anemia in chronic kidney disease: Secondary | ICD-10-CM | POA: Diagnosis present

## 2017-12-02 DIAGNOSIS — Z86711 Personal history of pulmonary embolism: Secondary | ICD-10-CM | POA: Diagnosis not present

## 2017-12-02 DIAGNOSIS — R0600 Dyspnea, unspecified: Secondary | ICD-10-CM

## 2017-12-02 DIAGNOSIS — G4733 Obstructive sleep apnea (adult) (pediatric): Secondary | ICD-10-CM | POA: Diagnosis present

## 2017-12-02 DIAGNOSIS — Z7189 Other specified counseling: Secondary | ICD-10-CM | POA: Diagnosis not present

## 2017-12-02 DIAGNOSIS — Z823 Family history of stroke: Secondary | ICD-10-CM

## 2017-12-02 DIAGNOSIS — J188 Other pneumonia, unspecified organism: Secondary | ICD-10-CM | POA: Diagnosis not present

## 2017-12-02 DIAGNOSIS — G250 Essential tremor: Secondary | ICD-10-CM | POA: Diagnosis present

## 2017-12-02 DIAGNOSIS — I5031 Acute diastolic (congestive) heart failure: Secondary | ICD-10-CM | POA: Diagnosis present

## 2017-12-02 DIAGNOSIS — Z8673 Personal history of transient ischemic attack (TIA), and cerebral infarction without residual deficits: Secondary | ICD-10-CM

## 2017-12-02 DIAGNOSIS — R072 Precordial pain: Secondary | ICD-10-CM | POA: Diagnosis not present

## 2017-12-02 DIAGNOSIS — R079 Chest pain, unspecified: Secondary | ICD-10-CM

## 2017-12-02 DIAGNOSIS — Z880 Allergy status to penicillin: Secondary | ICD-10-CM

## 2017-12-02 DIAGNOSIS — I351 Nonrheumatic aortic (valve) insufficiency: Secondary | ICD-10-CM | POA: Diagnosis not present

## 2017-12-02 DIAGNOSIS — J9600 Acute respiratory failure, unspecified whether with hypoxia or hypercapnia: Secondary | ICD-10-CM | POA: Diagnosis present

## 2017-12-02 DIAGNOSIS — K219 Gastro-esophageal reflux disease without esophagitis: Secondary | ICD-10-CM | POA: Diagnosis present

## 2017-12-02 DIAGNOSIS — R791 Abnormal coagulation profile: Secondary | ICD-10-CM | POA: Diagnosis present

## 2017-12-02 DIAGNOSIS — R0602 Shortness of breath: Secondary | ICD-10-CM | POA: Diagnosis not present

## 2017-12-02 DIAGNOSIS — Z7901 Long term (current) use of anticoagulants: Secondary | ICD-10-CM

## 2017-12-02 DIAGNOSIS — Z821 Family history of blindness and visual loss: Secondary | ICD-10-CM

## 2017-12-02 DIAGNOSIS — Z833 Family history of diabetes mellitus: Secondary | ICD-10-CM

## 2017-12-02 HISTORY — DX: Dysphagia, unspecified: R13.10

## 2017-12-02 HISTORY — DX: Unspecified fall, initial encounter: W19.XXXA

## 2017-12-02 HISTORY — DX: Repeated falls: R29.6

## 2017-12-02 HISTORY — DX: Nonrheumatic aortic (valve) insufficiency: I35.1

## 2017-12-02 HISTORY — DX: Esophageal obstruction: K22.2

## 2017-12-02 HISTORY — DX: Pneumonia, unspecified organism: J18.9

## 2017-12-02 LAB — I-STAT CHEM 8, ED
BUN: 26 mg/dL — ABNORMAL HIGH (ref 6–20)
CALCIUM ION: 1.21 mmol/L (ref 1.15–1.40)
Chloride: 107 mmol/L (ref 101–111)
Creatinine, Ser: 1.6 mg/dL — ABNORMAL HIGH (ref 0.61–1.24)
GLUCOSE: 137 mg/dL — AB (ref 65–99)
HCT: 31 % — ABNORMAL LOW (ref 39.0–52.0)
HEMOGLOBIN: 10.5 g/dL — AB (ref 13.0–17.0)
POTASSIUM: 4.8 mmol/L (ref 3.5–5.1)
SODIUM: 142 mmol/L (ref 135–145)
TCO2: 24 mmol/L (ref 22–32)

## 2017-12-02 LAB — CBC
HEMATOCRIT: 33.7 % — AB (ref 39.0–52.0)
Hemoglobin: 10.8 g/dL — ABNORMAL LOW (ref 13.0–17.0)
MCH: 38.4 pg — ABNORMAL HIGH (ref 26.0–34.0)
MCHC: 32 g/dL (ref 30.0–36.0)
MCV: 119.9 fL — AB (ref 78.0–100.0)
Platelets: 138 10*3/uL — ABNORMAL LOW (ref 150–400)
RBC: 2.81 MIL/uL — AB (ref 4.22–5.81)
RDW: 16.2 % — AB (ref 11.5–15.5)
WBC: 10.8 10*3/uL — ABNORMAL HIGH (ref 4.0–10.5)

## 2017-12-02 LAB — TROPONIN I
Troponin I: 1.12 ng/mL (ref <0.03)
Troponin I: 1.37 ng/mL (ref <0.03)

## 2017-12-02 LAB — D-DIMER, QUANTITATIVE: D-Dimer, Quant: <0.27 ug/mL-FEU (ref 0.00–0.50)

## 2017-12-02 LAB — BASIC METABOLIC PANEL
ANION GAP: 10 (ref 5–15)
BUN: 26 mg/dL — AB (ref 6–20)
CHLORIDE: 107 mmol/L (ref 101–111)
CO2: 23 mmol/L (ref 22–32)
Calcium: 9.2 mg/dL (ref 8.9–10.3)
Creatinine, Ser: 1.6 mg/dL — ABNORMAL HIGH (ref 0.61–1.24)
GFR calc Af Amer: 44 mL/min — ABNORMAL LOW (ref >60)
GFR, EST NON AFRICAN AMERICAN: 38 mL/min — AB (ref >60)
Glucose, Bld: 142 mg/dL — ABNORMAL HIGH (ref 65–99)
POTASSIUM: 4.8 mmol/L (ref 3.5–5.1)
SODIUM: 140 mmol/L (ref 135–145)

## 2017-12-02 LAB — LACTIC ACID, PLASMA
LACTIC ACID, VENOUS: 1.7 mmol/L (ref 0.5–1.9)
Lactic Acid, Venous: 1.8 mmol/L (ref 0.5–1.9)

## 2017-12-02 LAB — I-STAT TROPONIN, ED: Troponin i, poc: 0.76 ng/mL (ref 0.00–0.08)

## 2017-12-02 LAB — INFLUENZA PANEL BY PCR (TYPE A & B)
INFLAPCR: NEGATIVE
INFLBPCR: NEGATIVE

## 2017-12-02 LAB — PROTIME-INR
INR: 2.11
Prothrombin Time: 23.5 seconds — ABNORMAL HIGH (ref 11.4–15.2)

## 2017-12-02 LAB — PROCALCITONIN: PROCALCITONIN: 0.58 ng/mL

## 2017-12-02 MED ORDER — PREDNISONE 20 MG PO TABS
20.0000 mg | ORAL_TABLET | Freq: Every day | ORAL | Status: AC
  Administered 2017-12-02 – 2017-12-04 (×3): 20 mg via ORAL
  Filled 2017-12-02 (×3): qty 1

## 2017-12-02 MED ORDER — SODIUM CHLORIDE 0.9 % IV SOLN: 250.0000 mL | INTRAVENOUS | Status: DC | PRN

## 2017-12-02 MED ORDER — VANCOMYCIN HCL 10 G IV SOLR
1500.0000 mg | Freq: Once | INTRAVENOUS | Status: AC
  Administered 2017-12-02: 1500 mg via INTRAVENOUS
  Filled 2017-12-02: qty 1500

## 2017-12-02 MED ORDER — ACETAMINOPHEN 650 MG RE SUPP
650.0000 mg | Freq: Four times a day (QID) | RECTAL | Status: DC | PRN
  Administered 2017-12-02: 650 mg via RECTAL
  Filled 2017-12-02: qty 1

## 2017-12-02 MED ORDER — MEMANTINE HCL 10 MG PO TABS
20.0000 mg | ORAL_TABLET | Freq: Every day | ORAL | Status: DC
  Administered 2017-12-02 – 2017-12-04 (×3): 20 mg via ORAL
  Filled 2017-12-02 (×3): qty 2

## 2017-12-02 MED ORDER — WARFARIN SODIUM 2.5 MG PO TABS: 2.5000 mg | ORAL_TABLET | Freq: Every day | ORAL | Status: DC

## 2017-12-02 MED ORDER — POLYETHYLENE GLYCOL 3350 17 G PO PACK
17.0000 g | PACK | Freq: Every day | ORAL | Status: DC | PRN
  Administered 2017-12-03 – 2017-12-07 (×2): 17 g via ORAL
  Filled 2017-12-02 (×2): qty 1

## 2017-12-02 MED ORDER — SODIUM CHLORIDE 0.9% FLUSH
3.0000 mL | Freq: Two times a day (BID) | INTRAVENOUS | Status: DC
  Administered 2017-12-02 – 2017-12-07 (×12): 3 mL via INTRAVENOUS

## 2017-12-02 MED ORDER — WARFARIN - PHARMACIST DOSING INPATIENT: Freq: Every day | Status: DC

## 2017-12-02 MED ORDER — ACETAMINOPHEN 325 MG PO TABS
650.0000 mg | ORAL_TABLET | Freq: Four times a day (QID) | ORAL | Status: DC | PRN
  Administered 2017-12-02 – 2017-12-04 (×4): 650 mg via ORAL
  Filled 2017-12-02 (×4): qty 2

## 2017-12-02 MED ORDER — SODIUM CHLORIDE 0.9% FLUSH: 3.0000 mL | INTRAVENOUS | Status: DC | PRN

## 2017-12-02 MED ORDER — SIMVASTATIN 40 MG PO TABS
40.0000 mg | ORAL_TABLET | Freq: Every day | ORAL | Status: DC
  Administered 2017-12-02 – 2017-12-04 (×3): 40 mg via ORAL
  Filled 2017-12-02 (×4): qty 1

## 2017-12-02 MED ORDER — WARFARIN SODIUM 2.5 MG PO TABS
2.5000 mg | ORAL_TABLET | Freq: Once | ORAL | Status: AC
  Administered 2017-12-02: 2.5 mg via ORAL
  Filled 2017-12-02: qty 1

## 2017-12-02 MED ORDER — PANTOPRAZOLE SODIUM 40 MG PO TBEC
40.0000 mg | DELAYED_RELEASE_TABLET | Freq: Every day | ORAL | Status: DC
  Administered 2017-12-02 – 2017-12-06 (×4): 40 mg via ORAL
  Filled 2017-12-02 (×4): qty 1

## 2017-12-02 MED ORDER — LORATADINE 10 MG PO TABS
10.0000 mg | ORAL_TABLET | Freq: Every day | ORAL | Status: DC
  Administered 2017-12-02 – 2017-12-04 (×3): 10 mg via ORAL
  Filled 2017-12-02 (×3): qty 1

## 2017-12-02 MED ORDER — FENOFIBRATE 145 MG PO TABS
145.0000 mg | ORAL_TABLET | Freq: Every day | ORAL | Status: DC
  Filled 2017-12-02: qty 1

## 2017-12-02 MED ORDER — WARFARIN SODIUM 2.5 MG PO TABS
2.5000 mg | ORAL_TABLET | Freq: Every day | ORAL | Status: DC
  Filled 2017-12-02: qty 1

## 2017-12-02 MED ORDER — RIVASTIGMINE TARTRATE 1.5 MG PO CAPS
6.0000 mg | ORAL_CAPSULE | Freq: Two times a day (BID) | ORAL | Status: DC
  Administered 2017-12-02 – 2017-12-04 (×6): 6 mg via ORAL
  Filled 2017-12-02 (×5): qty 4
  Filled 2017-12-02: qty 2
  Filled 2017-12-02: qty 4

## 2017-12-02 MED ORDER — ONDANSETRON HCL 4 MG PO TABS: 4.0000 mg | ORAL_TABLET | Freq: Four times a day (QID) | ORAL | Status: DC | PRN

## 2017-12-02 MED ORDER — SODIUM CHLORIDE 0.9 % IV SOLN
1.0000 g | Freq: Once | INTRAVENOUS | Status: AC
  Administered 2017-12-02: 1 g via INTRAVENOUS
  Filled 2017-12-02: qty 1

## 2017-12-02 MED ORDER — SODIUM CHLORIDE 0.9 % IV SOLN
2.0000 g | INTRAVENOUS | Status: DC
  Administered 2017-12-03 – 2017-12-04 (×2): 2 g via INTRAVENOUS
  Filled 2017-12-02 (×3): qty 2

## 2017-12-02 MED ORDER — METRONIDAZOLE IN NACL 5-0.79 MG/ML-% IV SOLN
500.0000 mg | Freq: Three times a day (TID) | INTRAVENOUS | Status: DC
  Administered 2017-12-02 – 2017-12-04 (×5): 500 mg via INTRAVENOUS
  Filled 2017-12-02 (×5): qty 100

## 2017-12-02 MED ORDER — IPRATROPIUM-ALBUTEROL 0.5-2.5 (3) MG/3ML IN SOLN
3.0000 mL | Freq: Once | RESPIRATORY_TRACT | Status: AC
  Administered 2017-12-02: 3 mL via RESPIRATORY_TRACT
  Filled 2017-12-02: qty 3

## 2017-12-02 MED ORDER — VANCOMYCIN HCL IN DEXTROSE 1-5 GM/200ML-% IV SOLN
1000.0000 mg | INTRAVENOUS | Status: DC
  Administered 2017-12-03 – 2017-12-04 (×2): 1000 mg via INTRAVENOUS
  Filled 2017-12-02 (×3): qty 200

## 2017-12-02 MED ORDER — FENOFIBRATE 160 MG PO TABS
160.0000 mg | ORAL_TABLET | Freq: Every day | ORAL | Status: DC
  Administered 2017-12-03 – 2017-12-04 (×2): 160 mg via ORAL
  Filled 2017-12-02 (×3): qty 1

## 2017-12-02 MED ORDER — ONDANSETRON HCL 4 MG/2ML IJ SOLN: 4.0000 mg | Freq: Four times a day (QID) | INTRAMUSCULAR | Status: DC | PRN

## 2017-12-02 MED ORDER — DOCUSATE SODIUM 100 MG PO CAPS
100.0000 mg | ORAL_CAPSULE | Freq: Every day | ORAL | Status: DC
  Administered 2017-12-02 – 2017-12-04 (×3): 100 mg via ORAL
  Filled 2017-12-02 (×3): qty 1

## 2017-12-02 MED ORDER — ASPIRIN 325 MG PO TABS
325.0000 mg | ORAL_TABLET | Freq: Once | ORAL | Status: AC
  Administered 2017-12-02: 325 mg via ORAL
  Filled 2017-12-02: qty 1

## 2017-12-02 MED ORDER — IPRATROPIUM-ALBUTEROL 0.5-2.5 (3) MG/3ML IN SOLN
3.0000 mL | RESPIRATORY_TRACT | Status: DC | PRN
  Administered 2017-12-02 – 2017-12-04 (×3): 3 mL via RESPIRATORY_TRACT
  Filled 2017-12-02 (×3): qty 3

## 2017-12-02 NOTE — Telephone Encounter (Signed)
Copied from [place] [id number]. Topic: General - Other >> Dec 02, 2017  8:32 AM Darl Householder, RMA wrote: Reason for CRM: Patient's wife [name] is calling to notify Dr. Danise Mina that pt is in [facility] hospital and has been diagnosed with pneumonia and his treponin is 70, and he has been admitted

## 2017-12-02 NOTE — Progress Notes (Signed)
CRITICAL VALUE ALERT  Critical Value: Troponin 1.37  Date & Time Notied: 11/08/2017 2030  Provider Notified: On call NP Schorr, on call MD [facility]  Orders Received/Actions taken: Aspirin 325 mg ordered once

## 2017-12-02 NOTE — Evaluation (Signed)
Clinical/Bedside Swallow Evaluation Patient Details  Name: [name] MRN: [id number] Date of Birth: 12-16-1930  Today's Date: 11/19/2017 Time: SLP Start Time (ACUTE ONLY): 1425 SLP Stop Time (ACUTE ONLY): 1439 SLP Time Calculation (min) (ACUTE ONLY): 14 min  Past Medical History:  Past Medical History:  Diagnosis Date  . Allergic rhinitis   . Arthritis   . Blood transfusion 1990's  . CAD (coronary artery disease)    cath 2000 30% single vessel, normal nuclear stress test 12/03/2010, no evidence ischemia  . Candidal urethritis in male 06/15/2015  . CKD (chronic kidney disease) stage 3, GFR 30-59 ml/min (HCC)    baseline Cr 1.7  . Closed C7 fracture ([place]) 11/06/2014  . DDD (degenerative disc disease) 03/2013   by CT, diffuse multilevel cervical and lumbar spondylosis  . Dementia   . Depression   . DISH (diffuse idiopathic skeletal hyperostosis) 03/2013   lumbar spine on xray  . Dizziness    multifactorial, s/p PT at [facility] 06/2014 with HEP  . DVT (deep venous thrombosis) ([place]) 07/2016  . Dysphagia   . Elevated PSA    previous-normalized (followed by Dr. Jeffie Pollock, rec no repeat unless urinary sxs)  . Epididymoorchitis 08/21/2017  . Esophageal stenosis   . Essential tremor 09/27/2008   reviewed eval by Dr Jannifer Franklin in chart 2011   . Falls   . Gait abnormality 03/16/2017  . GERD (gastroesophageal reflux disease)    h/o PUD  . Hearing loss 06/2012   eval - rec annual exam  . History of fracture of right hip   . History of phlebitis   . History of pyelonephritis 05/2014   hospitalization with sepsis  . HLD (hyperlipidemia)    hypertriglyceridemia  . HSV 03/08/2007   Qualifier: Diagnosis of  By: Council Mechanic MD, Hilaria Ota   . Hx pulmonary embolism 08/1991   negative hypercoagulable panel 12/2014  . Inguinal hernia   . Macrocytic anemia 2014   stable B12/folate and periph smear 05/2013, again periph smear 2017 with mature cells, mild dacrocytosis  . Mild aortic insufficiency   .  Osteoporosis 11/2013   DEXA T score -2.7  . Prediabetes   . Schatzki's ring 02/2012   s/p dilation [name]  . Sleep apnea    no CPAP- sleep apnea "cleared up 15 years ago"  . Stroke ([place])   . Urge incontinence    Past Surgical History:  Past Surgical History:  Procedure Laterality Date  . APPENDECTOMY  1996  . CARDIAC CATHETERIZATION  2000   30% one vessel  . CARDIOVASCULAR STRESS TEST  03/2015   no ischemia, low risk, EF 76%  . CAROTID U/S  12/28/2007   nml  . CATARACT EXTRACTION  09/2009   bilateral  . COLONOSCOPY  1998   [place] w[place]  . COLONOSCOPY  07/2010   5 polyps, adenomatous, rec rpt 3 yrs  . COLONOSCOPY  04/2014   3 polyps, adenomatous, f/u open ended given age [name]  . CT ABD W & PELVIS WO CM  07/2001   Scarring of right lung, stable negative o/w  . CT ABD W & PELVIS WO CM  11/2000   ? stones, right LL scarring  . CYSTOSCOPY  1992   for kidney stones  . EEG  11/12/2009   nml  . ESOPHAGOGASTRODUODENOSCOPY  02/2012   dilation of schatzki's ring [name]  . ESOPHAGOGASTRODUODENOSCOPY N/A 11/15/2015   Procedure: ESOPHAGOGASTRODUODENOSCOPY (EGD);  Surgeon: Gatha Mayer, MD;  Location: [facility] ENDOSCOPY;  Service: Endoscopy;  Laterality:  N/A;  . ESOPHAGOGASTRODUODENOSCOPY  11/2016   esophageal erosions, GERD, esophageal stenosis dilated [name]  . ESOPHAGOGASTRODUODENOSCOPY (EGD) WITH PROPOFOL N/A 10/29/2017   dilated esophageal stenosis - Milus Banister, MD  . [place]   with incidental appendectomy  . HERNIA REPAIR  1979   Left  . HIP PINNING     right hip  . HIP PINNING,CANNULATED Left 03/11/2016   Procedure: CANNULATED HIP PINNING;  Surgeon: Rod Can, MD;  Location: [place];  Service: Orthopedics;  Laterality: Left;  . KNEE ARTHROSCOPY  03/24/02   Right (Dr. Mauri Pole)  . MRI  11/2009   Head, nml  . ORIF FEMORAL NECK FRACTURE W/ DHS Right 08/03/2002   Dr Mauri Pole  . TONSILLECTOMY  1965  . US ECHOCARDIOGRAPHY  12/28/2007   Mild aortic valve  calcification EF 55%, basically nml  . V/Q SCAN  06/1999   negative   HPI:  [name] is a 84 y.o. male with medical history significant of CAD, stage 3 CKD, dementia, hyperlipidemia, chronic anemia, hx CVA, remote hx extensive PE and DVT 1992, 2017 on chronic coumadin, chronic cystitis followed by Dr Roni Bread, GERD, hx dysphagia with esophageal stricture last dilitation 10/2017, benign tremor for years currently undergoing w/u at [facility] for [place] presents with chest pain and shortness of breath. Symptoms awoke pt from sleep with sob and midsternal, nonradiating chest pain with no associated diaphoresis or palpitations.  Pain unchanged with pepto bismol wife gave him at 0400. Pain relieved with ASA and O2 in ambulance. He is pain free now. Pt and wife deny recent fever, headache, dizziness, abdominal pain, n/v/d or LE swelling. Wife does report frequent coughing while sleeping and sometime while eating. CXR shows multifocal pna bilaterally. Prominent cricopharyngeus muscle. 2. Moderate distal esophageal smooth stricture over approximately 3.2 cm segment .   Assessment / Plan / Recommendation Clinical Impression  Pt demonstrates multiple risk factors for potential dysphagia related aspiration events. Pt has esophageal dyphagia and conern for nighttime GER, possible neuromuscular disease (Parkinsons Dx pending), and current dx of multifocal pna. Pt displays subtle signs of possible pharyngeal dysphagia at baseline. Recommend pt initiate a regular diet and thin liquids with MBS planned for tomorrow for objective assessment of swallowing.  SLP Visit Diagnosis: Dysphagia, oropharyngeal phase (R13.12)    Aspiration Risk  Moderate aspiration risk    Diet Recommendation Regular;Thin liquid   Liquid Administration via: Cup;Straw Medication Administration: Whole meds with liquid Supervision: Patient able to self feed Postural Changes: Seated upright at 90 degrees    Other  Recommendations Oral Care  Recommendations: Oral care BID   Follow up Recommendations 24 hour supervision/assistance      Frequency and Duration            Prognosis        Swallow Study   General HPI: NEVAN CREIGHTON is a 84 y.o. male with medical history significant of CAD, stage 3 CKD, dementia, hyperlipidemia, chronic anemia, hx CVA, remote hx extensive PE and DVT 1992, 2017 on chronic coumadin, chronic cystitis followed by Dr Roni Bread, GERD, hx dysphagia with esophageal stricture last dilitation 10/2017, benign tremor for years currently undergoing w/u at [facility] for [place] presents with chest pain and shortness of breath. Symptoms awoke pt from sleep with sob and midsternal, nonradiating chest pain with no associated diaphoresis or palpitations.  Pain unchanged with pepto bismol wife gave him at 0400. Pain relieved with ASA and O2 in ambulance. He is pain free now. Pt and  wife deny recent fever, headache, dizziness, abdominal pain, n/v/d or LE swelling. Wife does report frequent coughing while sleeping and sometime while eating. CXR shows multifocal pna bilaterally. Prominent cricopharyngeus muscle. 2. Moderate distal esophageal smooth stricture over approximately 3.2 cm segment . Type of Study: Bedside Swallow Evaluation Previous Swallow Assessment: esophagram, reports MBS in past Diet Prior to this Study: NPO Temperature Spikes Noted: Yes Respiratory Status: Nasal cannula History of Recent Intubation: No Behavior/Cognition: Alert;Cooperative;Pleasant mood Oral Cavity Assessment: Dry Oral Care Completed by SLP: No Oral Cavity - Dentition: Adequate natural dentition Vision: Functional for self-feeding Self-Feeding Abilities: Needs assist Patient Positioning: Upright in bed Baseline Vocal Quality: Low vocal intensity Volitional Cough: Strong Volitional Swallow: Able to elicit    Oral/Motor/Sensory Function     Ice Chips     Thin Liquid Thin Liquid: Impaired Presentation: Cup;Straw;Self Fed Pharyngeal  Phase  Impairments: Multiple swallows;Wet Vocal Quality;Throat Clearing - Immediate;Suspected delayed Swallow    Nectar Thick Nectar Thick Liquid: Not tested   Honey Thick Honey Thick Liquid: Not tested   Puree Puree: Impaired Pharyngeal Phase Impairments: Suspected delayed Swallow   Solid   GO   Solid: Impaired Oral Phase Impairments: Impaired mastication Oral Phase Functional Implications: Impaired mastication;Prolonged oral transit       Herbie Baltimore, MA CCC-SLP [phone]  Lynann Beaver 11/21/2017,3:28 PM

## 2017-12-02 NOTE — ED Notes (Signed)
Admitting at bedside 

## 2017-12-02 NOTE — ED Triage Notes (Signed)
Pt BIB EMS from home. Woke up with CP around 0200 this morning. Initially 9/10 central pressure with SOB. Denies other s/sx. Denies pain at this time, unless palpated to epigastric area. EMS reports 85% on RA. Placed on 4Lpm via [place]. SpO2 now 100%. Hx of previous CVA with residual benign tremor. Also reports general weakness and +1 edema to BLEs.

## 2017-12-02 NOTE — ED Notes (Signed)
PT at bedside.

## 2017-12-02 NOTE — Progress Notes (Signed)
ANTICOAGULATION & ANTIBIOTIC CONSULT NOTE - Initial Consult  Pharmacy Consult for Cefepime/Vanc + Warfarin Indication: Pneumonia + hx PE/DVT  Allergies  Allergen Reactions  . Lactose Intolerance (Gi) Diarrhea  . Sulfa Antibiotics Hives  . Sulfadiazine Hives  . Aricept [Donepezil Hcl] Rash  . Lipitor [Atorvastatin] Rash  . Nizatidine Rash  . Penicillins Rash    Has patient had a PCN reaction causing immediate rash, facial/tongue/throat swelling, SOB or lightheadedness with hypotension: Yes Has patient had a PCN reaction causing severe rash involving mucus membranes or skin necrosis: No Has patient had a PCN reaction that required hospitalization: No Has patient had a PCN reaction occurring within the last 10 years: No If all of the above answers are "NO", then may proceed with Cephalosporin use.     Patient Measurements: Height: 5\' 6"  (167.6 cm) Weight: 172 lb (78 kg) IBW/kg (Calculated) : 63.8  Vital Signs: Temp: 99.4 F (37.4 C) (03/27 0936) Temp Source: Oral (03/27 0936) BP: 108/66 (03/27 1245) Pulse Rate: 90 (03/27 1245)  Labs: Recent Labs    12/01/17 11/10/2017 0801 11/25/2017 0807  HGB  --  10.8* 10.5*  HCT  --  33.7* 31.0*  PLT  --  138*  --   LABPROT  --  23.5*  --   INR 2.3 2.11  --   CREATININE  --  1.60* 1.60*    Estimated Creatinine Clearance: 33.8 mL/min (A) (by C-G formula based on SCr of 1.6 mg/dL (H)).   Medical History: Past Medical History:  Diagnosis Date  . Allergic rhinitis   . Arthritis   . Asthma    remote  . Blood transfusion 1990's  . CAD (coronary artery disease)    cath 2000 30% single vessel, normal nuclear stress test 12/03/2010, no evidence ischemia  . Candidal urethritis in male 06/15/2015  . CKD (chronic kidney disease) stage 3, GFR 30-59 ml/min (HCC)    baseline Cr 1.7  . Closed C7 fracture ([place]) 11/06/2014  . DDD (degenerative disc disease) 03/2013   by CT, diffuse multilevel cervical and lumbar spondylosis  . Dementia   .  Depression   . DISH (diffuse idiopathic skeletal hyperostosis) 03/2013   lumbar spine on xray  . Dizziness    multifactorial, s/p PT at [facility] 06/2014 with HEP  . DVT (deep venous thrombosis) ([place]) 07/2016  . Dysphagia   . Elevated PSA    previous-normalized (followed by Dr. Jeffie Pollock, rec no repeat unless urinary sxs)  . Epididymoorchitis 08/21/2017  . Esophageal stenosis   . Essential tremor 09/27/2008   reviewed eval by Dr Jannifer Franklin in chart 2011   . Falls   . Gait abnormality 03/16/2017  . GERD (gastroesophageal reflux disease)    h/o PUD  . Hearing loss 06/2012   eval - rec annual exam  . History of fracture of right hip   . History of phlebitis   . History of pyelonephritis 05/2014   hospitalization with sepsis  . HLD (hyperlipidemia)    hypertriglyceridemia  . HSV 03/08/2007   Qualifier: Diagnosis of  By: Council Mechanic MD, Hilaria Ota   . Hx pulmonary embolism 08/1991   negative hypercoagulable panel 12/2014  . Inguinal hernia   . Macrocytic anemia 2014   stable B12/folate and periph smear 05/2013, again periph smear 2017 with mature cells, mild dacrocytosis  . Mild aortic insufficiency   . Osteoporosis 11/2013   DEXA T score -2.7  . Poor historian   . Prediabetes   . Schatzki's ring 02/2012  s/p dilation [name]  . Sleep apnea    no CPAP- sleep apnea "cleared up 15 years ago"  . Stroke ([place])   . Urge incontinence   . UTI (urinary tract infection) due to Enterococcus 05/13/2015    Medications:   (Not in a hospital admission)  Assessment: 56 YOM with on warfarin at home for remote hx of extensive PE/DVT presents with chest pain and shortness of breath.   ID: Pharmacy consulted to start vancomycin and Cefepime for pneumonia and to resume home warfarin therapy. Chest xray showed multifocal pneumonia. WBC 10.8. Tm 101.59F. SCr 1.6 (BL ~ 1.4). CrCl ~ 35 mL/min. Patient has received a dose of cefepime and vancomycin in the ED.   AC: INR on admission is therapeutic at 2.11. Home  warfarin dose is 2.5 mg daily and last dose was yesterday. H/H and Plt low.   Of note, patient is on flagyl which can increase INR. Monitor carefully.   Goal of Therapy:  INR 2-3 Monitor platelets by anticoagulation protocol: Yes   Plan:  -Cefepime 2 gm IV Q 24 hours  -Vancomycin 1 gm IV Q 24 hours -Flagyl 500 mg IV Q 8 hours per MD  -Resume home warfarin regimen of 2.5 mg daily. Monitor INR carefully while on metronidazole  -Monitor CBC, renal fx, daily INR, cultures and clinical progress -VT at [place], PharmD., BCPS Clinical Pharmacist Clinical phone for 11/09/2017 until 3:30pm: [phone] If after 3:30pm, please call main pharmacy at: [phone]

## 2017-12-02 NOTE — H&P (Addendum)
History and Physical    MARSTON MCCADDEN ERX:[id number] DOB: 19-Nov-1930 DOA: 11/15/2017  PCP: Ria Bush, MD Consultants:  Cardiology Patient coming from: Home - lives with wife [name] (retired NP); Yahoo:   Chief Complaint: chest pain, sob  HPI: CALLIN ASHE is a 84 y.o. male with medical history significant of CAD, stage 3 CKD, dementia, hyperlipidemia, chronic anemia, hx CVA, remote hx extensive PE and DVT 1992, 2017 on chronic coumadin, chronic cystitis followed by Dr Roni Bread, GERD, hx dysphagia with esophageal stricture last dilitation 10/2017, benign tremor for years currently undergoing w/u at [facility] for [place] presents with chest pain and shortness of breath. Symptoms awoke pt from sleep with sob and midsternal, nonradiating chest pain with no associated diaphoresis or palpitations.  Pain unchanged with pepto bismol wife gave him at 0400. Pain relieved with ASA and O2 in ambulance. He is pain free now. Pt and wife deny recent fever, headache, dizziness, abdominal pain, n/v/d or LE swelling. Wife does report frequent coughing while sleeping and sometime while eating.   ED Course: EMS reports O2 sat 85% RA on their arrival improved with O2. Chest xray showing multifocal pna. trop poc 0.76, WBC 10.8 with no differential available, INR 2.11. Pt received a duoneb treatment, IV Vancomycin and Cefepime started. Flu PCR pending. Cardiology has been asked to consult regarding elevated trop.  Review of Systems: As per HPI; otherwise review of systems reviewed and negative.   Ambulatory Status: limited ambulation with walker  Past Medical History:  Diagnosis Date  . Allergic rhinitis   . Arthritis   . Asthma    remote  . Blood transfusion 1990's  . CAD (coronary artery disease)    cath 2000 30% single vessel, normal nuclear stress test 12/03/2010, no evidence ischemia  . Candidal urethritis in male 06/15/2015  . CKD (chronic kidney disease) stage 3, GFR 30-59 ml/min (HCC)    baseline Cr  1.7  . Closed C7 fracture ([place]) 11/06/2014  . DDD (degenerative disc disease) 03/2013   by CT, diffuse multilevel cervical and lumbar spondylosis  . Dementia   . Depression   . DISH (diffuse idiopathic skeletal hyperostosis) 03/2013   lumbar spine on xray  . Dizziness    multifactorial, s/p PT at [facility] 06/2014 with HEP  . DVT (deep venous thrombosis) ([place]) 07/2016  . Elevated PSA    previous-normalized (followed by Dr. Jeffie Pollock, rec no repeat unless urinary sxs)  . Epididymoorchitis 08/21/2017  . Essential tremor 09/27/2008   reviewed eval by Dr Jannifer Franklin in chart 2011   . Gait abnormality 03/16/2017  . GERD (gastroesophageal reflux disease)    h/o PUD  . Hearing loss 06/2012   eval - rec annual exam  . History of fracture of right hip   . History of phlebitis   . History of pyelonephritis 05/2014   hospitalization with sepsis  . HLD (hyperlipidemia)    hypertriglyceridemia  . HSV 03/08/2007   Qualifier: Diagnosis of  By: Council Mechanic MD, Hilaria Ota   . Hx pulmonary embolism 08/1991   negative hypercoagulable panel 12/2014  . Inguinal hernia   . Macrocytic anemia 2014   stable B12/folate and periph smear 05/2013, again periph smear 2017 with mature cells, mild dacrocytosis  . Osteoporosis 11/2013   DEXA T score -2.7  . Prediabetes   . Schatzki's ring 02/2012   s/p dilation [name]  . Sleep apnea    no CPAP- sleep apnea "cleared up 15 years ago"  . Stroke ([place])   .  Urge incontinence   . UTI (urinary tract infection) due to Enterococcus 05/13/2015    Past Surgical History:  Procedure Laterality Date  . APPENDECTOMY  1996  . CARDIAC CATHETERIZATION  2000   30% one vessel  . CARDIOVASCULAR STRESS TEST  03/2015   no ischemia, low risk, EF 76%  . CAROTID U/S  12/28/2007   nml  . CATARACT EXTRACTION  09/2009   bilateral  . COLONOSCOPY  1998   [place] wnl  . COLONOSCOPY  07/2010   5 polyps, adenomatous, rec rpt 3 yrs  . COLONOSCOPY  04/2014   3 polyps, adenomatous, f/u open ended given age  [name]  . CT ABD W & PELVIS WO CM  07/2001   Scarring of right lung, stable negative o/w  . CT ABD W & PELVIS WO CM  11/2000   ? stones, right LL scarring  . CYSTOSCOPY  1992   for kidney stones  . EEG  11/12/2009   nml  . ESOPHAGOGASTRODUODENOSCOPY  02/2012   dilation of schatzki's ring [name]  . ESOPHAGOGASTRODUODENOSCOPY N/A 11/15/2015   Procedure: ESOPHAGOGASTRODUODENOSCOPY (EGD);  Surgeon: Gatha Mayer, MD;  Location: [facility] ENDOSCOPY;  Service: Endoscopy;  Laterality: N/A;  . ESOPHAGOGASTRODUODENOSCOPY  11/2016   esophageal erosions, GERD, esophageal stenosis dilated [name]  . ESOPHAGOGASTRODUODENOSCOPY (EGD) WITH PROPOFOL N/A 10/29/2017   dilated esophageal stenosis - Milus Banister, MD  . [place]   with incidental appendectomy  . HERNIA REPAIR  1979   Left  . HIP PINNING     right hip  . HIP PINNING,CANNULATED Left 03/11/2016   Procedure: CANNULATED HIP PINNING;  Surgeon: Rod Can, MD;  Location: [place];  Service: Orthopedics;  Laterality: Left;  . KNEE ARTHROSCOPY  03/24/02   Right (Dr. Mauri Pole)  . MRI  11/2009   Head, nml  . ORIF FEMORAL NECK FRACTURE W/ DHS Right 08/03/2002   Dr Mauri Pole  . TONSILLECTOMY  1965  . US ECHOCARDIOGRAPHY  12/28/2007   Mild aortic valve calcification EF 55%, basically nml  . V/Q SCAN  06/1999   negative    Social History   Socioeconomic History  . Marital status: Married    Spouse name: Not on file  . Number of children: 2  . Years of education: Not on file  . Highest education level: Not on file  Occupational History  . 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 Financial resource strain: Not on file  . Food insecurity:    Worry: Not on file    Inability: Not on file  . Transportation needs:    Medical: Not on file    Non-medical: Not on file  Tobacco Use  . Smoking status: Never Smoker  . Smokeless tobacco: Never Used  Substance and Sexual Activity  . Alcohol  use: No  . Drug use: No  . Sexual activity: Not Currently  Lifestyle  . Physical activity:    Days per week: Not on file    Minutes per session: Not on file  . Stress: Not on file  Relationships  . Social connections:    Talks on phone: Not on file    Gets together: Not on file    Attends religious service: Not on file    Active member of club or organization: Not on file    Attends meetings of clubs or organizations: Not on file    Relationship status: Not on file  . Intimate partner violence:  Fear of current or ex partner: Not on file    Emotionally abused: Not on file    Physically abused: Not on file    Forced sexual activity: Not on file  Other Topics Concern  . Not on file  Social History Narrative   Married with 2 children   Husband of Adarryl Goldammer    Retired: was principal    Activity: walks dog 2-3 times daily about 66min, frequent stops    Diet: some water, good fruits/vegetables, fish 1x/wk, no sodas.      Advanced directives: Chauncey Reading is wife, [name]. Does not want prolonged life support if terminal      Patient does not drink caffeine.   Patient is right handed.     Allergies  Allergen Reactions  . Lactose Intolerance (Gi) Diarrhea  . Sulfa Antibiotics Hives  . Sulfadiazine Hives  . Aricept [Donepezil Hcl] Rash  . Lipitor [Atorvastatin] Rash  . Nizatidine Rash  . Penicillins Rash    Has patient had a PCN reaction causing immediate rash, facial/tongue/throat swelling, SOB or lightheadedness with hypotension: Yes Has patient had a PCN reaction causing severe rash involving mucus membranes or skin necrosis: No Has patient had a PCN reaction that required hospitalization: No Has patient had a PCN reaction occurring within the last 10 years: No If all of the above answers are "NO", then may proceed with Cephalosporin use.     Family History  Problem Relation Age of Onset  . Stroke Mother   . Hypertension Mother   . Cancer Brother        prostate with  mets  . Blindness Brother        legally  . Diabetes Brother   . Pulmonary embolism Sister        from shoulder operation  . Alcohol abuse Brother   . Colon cancer Neg Hx   . Esophageal cancer Neg Hx   . Rectal cancer Neg Hx   . Stomach cancer Neg Hx     Prior to Admission medications   Medication Sig Start Date End Date Taking? Authorizing Provider  acetaminophen (TYLENOL) 500 MG tablet Take 1 tablet (500 mg total) by mouth daily. For hip pain, with extra as needed 07/28/17  Yes Ria Bush, MD  b complex vitamins tablet Take 1 tablet by mouth daily.   Yes [provider]  Biotin 2500 MCG CAPS Take 1 capsule by mouth daily.   Yes [provider]  Cholecalciferol (VITAMIN D) 2000 UNITS CAPS Take 2,000 Units by mouth daily. Reported on 01/30/2016   Yes [provider]  Coenzyme Q10 (COQ-10) 200 MG CAPS Take 200 mg by mouth daily.    Yes [provider]  Cyanocobalamin (B-12 PO) Take 10,000 mcg by mouth 3 (three) times a week.    Yes [provider]  Dextran 70-Hypromellose, PF, (ARTIFICIAL TEARS PF) 0.1-0.3 % SOLN Place 1-2 drops into both eyes 3 (three) times daily as needed (for dry eyes).    Yes [provider]  docusate sodium (COLACE) 100 MG capsule Take 100 mg by mouth at bedtime.    Yes [provider]  esomeprazole (NEXIUM) 40 MG capsule Take 40 mg by mouth daily at 12 noon.   Yes [provider]  fenofibrate (TRICOR) 145 MG tablet TAKE 1 TABLET DAILY 11/30/17  Yes Ria Bush, MD  fexofenadine (ALLEGRA) 180 MG tablet Take 180 mg by mouth daily.    Yes [provider]  memantine (NAMENDA) 10 MG  tablet Take 20 mg by mouth at bedtime.   Yes [provider]  Multiple Vitamins-Minerals (MULTIVITAMIN PO) Take 1 tablet by mouth daily.    Yes [provider]  rivastigmine (EXELON) 3 MG capsule Take 6 mg by mouth 2 (two) times daily.   Yes [provider]  simvastatin  (ZOCOR) 40 MG tablet Take 1 tablet (40 mg total) by mouth daily. 12/09/16  Yes Ria Bush, MD  venlafaxine [facility]) 75 MG tablet TAKE ONE TABLET TWICE DAILY WITH MEALS 01/06/17  Yes Ria Bush, MD  warfarin (COUMADIN) 2.5 MG tablet TAKE ONE (1) TABLET EACH DAY 10/19/17  Yes Ria Bush, MD  albuterol (PROVENTIL HFA;VENTOLIN HFA) 108 (90 Base) MCG/ACT inhaler Inhale 2 puffs into the lungs every 6 (six) hours as needed for wheezing or shortness of breath. 01/10/16   Ria Bush, MD  clotrimazole (LOTRIMIN) 1 % cream Apply 1 application topically 2 (two) times daily. Patient taking differently: Apply 1 application topically 2 (two) times daily as needed (for rash).  05/02/15   Pleas Koch, NP  DEXILANT 60 MG capsule TAKE 1 CAPSULE DAILY Patient taking differently: Take 60 mg by mouth once a day 02/03/17   Milus Banister, MD    Physical Exam: Vitals:   11/06/2017 0745 11/07/2017 0815 11/10/2017 0900 11/26/2017 0930  BP: 106/60 111/65 (!) 108/59 (!) 113/59  Pulse: 83 85 77 92  Resp: (!) 22 (!) 23 19 (!) 22  Temp:      TempSrc:      SpO2: 100% 99% 100% 99%  Weight:      Height:         General: elderly, chronically weak-appearing gentleman in NAD Eyes: PERRL, EOMI, normal lids, iris ENT: grossly normal hearing, lips & tongue, mucous membranes dry; appropriate dentition Neck: no LAD, masses or thyromegaly; no carotid bruits Cardiovascular: S1S2 RRR, no m/r/g. Trace pedal edema  Respiratory: Decreased inspiratory effort. He has scattered rhonchi greater at bases, no wheeze or crackles no increased wob   Abdomen:  soft, NT, ND, NABS Skin: no rash or induration seen on limited exam Musculoskeletal: grossly normal tone BUE/BLE, good ROM, no bony abnormality Lower extremity: Limited foot exam with no ulcerations.  2+ distal pulses. Psychiatric: grossly normal mood and affect, speech fluent and appropriate, AO to name and place Neurologic: tremor. CN 2-12 grossly intact,  moves all extremities in coordinated fashion, sensation intact    Radiological Exams on Admission: Dg Chest 2 View  Result Date: 11/30/2017 CLINICAL DATA:  Chest pain and shortness of breath EXAM: CHEST - 2 VIEW COMPARISON:  March 02, 2017 FINDINGS: There is patchy airspace opacity in both upper lobes as well as in the right middle lobe and lingular regions. There is a minimal left pleural effusion. Heart is upper normal in size with pulmonary vascularity within normal limits. No adenopathy. No bone lesions. IMPRESSION: Multifocal pneumonia bilaterally. Minimal left pleural effusion. Heart upper normal in size. Followup PA and lateral chest radiographs recommended in 3-4 weeks following trial of antibiotic therapy to ensure resolution and exclude underlying malignancy. Electronically Signed   By: Lowella Grip III M.D.   On: 11/28/2017 07:49    EKG: Independently reviewed.  NSR with rate 88; nonspecific ST changes with no evidence of acute ischemia   Labs on Admission: I have personally reviewed the available labs and imaging studies at the time of the admission.  Pertinent labs:  WBC 10.8 (no diffrential) Hgb 10.8 BUN/Cr 26/1.60 (baseline Cr 1.7) Trop 0.76  INR 2.11    Assessment/Plan Principal Problem:   Acute respiratory failure with hypoxia (HCC) Active Problems:   CKD (chronic kidney disease) stage 3, GFR 30-59 ml/min (HCC)   Essential tremor   HLD (hyperlipidemia)   GERD (gastroesophageal reflux disease)   Dementia   Macrocytic anemia   (HFpEF) heart failure with preserved ejection fraction (HCC)   Elevated factor VIII level   Long term (current) use of anticoagulants   CAP (community acquired pneumonia)   Elevated troponin    Acute hypoxic respiratory failure in setting of CAP -pt afebrile, NT appearing, VSS, WBC wnl. Will admit to telemetry unit -Also concern for aspiration based on pt's hx. Will ask SLP to evaluate. Will do emipiric Vancomycin and Zosyn for  now -blood cultures x 2, sputum cultures, prn nebs -with hx PE/DVT and recent subtherapeutic INRs will check ddimer -am labs  Elevated troponin with hx CAD -consider ACS vs demand ischemia with above issues. He is pain free now -cycle cardiac enzymes -received ASA by EMS. Cardiology recommends to wait troponin values to determine if needs to be continued.  -avoid BB given hx of "soft" BPs per wife and already high fall risk -appreciate cardiology assistance   CKD, stage III -stable. Cr 1.6 (baseline 1.7) -monitor  Hx DVT/PE (elevated factor VIII levels) on chronic Coumadin -INR therapeutic 2.11. Chart review shows INR <2 on 3/8. Will check ddimer to determine need for CT angio  -Coumadin per pharmacy  Chronic diastolic dysfunction -grade 2 diastolic dysfunction on echo 11/13/15 EF 60-65%.Will reeval this admit per card recs -volume-wise he appears stable on exam -Monitor  Anemia, mild chronic -hgb 10.8. Chronic dz r/t ckd -monitor  Hyperlipidemia -FLP in am  -continue home meds  Dementia -continue Namenda, rivastigmine   Chronic deconditioning  -Will ask PT/OT to evaluate. Wife states pt has had significantly limited mobility since breaking his hip a couple of years ago.   DVT prophylaxis:  On coumadin  Code Status: DNR - confirmed with patient/family. No CPR, no intubation. OK for pressors Family Communication: wife at bedside on evaluation Disposition Plan: Home once clinically improved Consults called: cardiology Admission status: inpt  Patrici Ranks, NP-C [place]  pgr [phone]   If note is complete, please contact covering daytime or nighttime physician. [email or web address] Password [email or web address]  11/24/2017, 10:48 AM

## 2017-12-02 NOTE — ED Notes (Signed)
Cardiology at bedside for evaluation.

## 2017-12-02 NOTE — Progress Notes (Addendum)
D/w nurse staff  About  Pt  Having  Chest pain , which was  4/10, BP 110/60 AVOIDING NITRO s/l  Due to low  bp  PT HAD  Cp relieved with aspirin Conservative mgmt  Per notes  Troponin  Trending up, asa  Initiated,  Will have  Cards  AM team decide on conservative  Vs  Only hep gtt  Or offer PCI ,  Asymptomatic Per  Earlier  Plan only  ASA for now

## 2017-12-02 NOTE — ED Provider Notes (Signed)
[facility] EMERGENCY DEPARTMENT Provider Note   CSN: [id number] Arrival date & time: 11/17/2017  1443     History   Chief Complaint Chief Complaint  Patient presents with  . Chest Pain  . Shortness of Breath    HPI [name] is a 84 y.o. male.  84 yo M with a cc of chest pain. Woke him up from sleep about 8 hours ago.  SOB with this.  Central without radiation.  Feels like prior episodes of reflux.  Describes it as a dull pain.  Nothing seems to make it worse.  Took mylanta without some mild improvement.  Improved significantly with aspirin.  Took his oxygen saturation at home and was hypoxic.  Wife took him in to the ed.   He denies any recent infectious symptoms denies cough congestion or fever.  Denies abdominal pain vomiting or diarrhea.  Has been eating and drinking normally.  Has a small amount of edema in his legs which is at his baseline.  Not on oxygen at home.  Recently changed medications for reflux from dexelant to nexium.   The history is provided by the patient.  Chest Pain   This is a new problem. The current episode started 6 to 12 hours ago. The problem occurs constantly. The problem has not changed since onset.The pain is associated with rest. The pain is present in the substernal region. The pain is at a severity of 8/10. The pain is moderate. The quality of the pain is described as sharp. The pain does not radiate. Duration of episode(s) is 8 hours. Associated symptoms include shortness of breath. Pertinent negatives include no abdominal pain, no fever, no headaches, no palpitations and no vomiting. He has tried nothing for the symptoms. The treatment provided no relief.  His past medical history is significant for diabetes, DVT, hyperlipidemia and PE.  Pertinent negatives for past medical history include no CAD and no MI.  Shortness of Breath  Associated symptoms include chest pain. Pertinent negatives include no fever, no headaches, no  vomiting, no abdominal pain and no rash. Associated medical issues include PE and DVT. Associated medical issues do not include CAD or past MI.    Past Medical History:  Diagnosis Date  . Allergic rhinitis   . Arthritis   . Blood transfusion 1990's  . CAD (coronary artery disease)    cath 2000 30% single vessel, normal nuclear stress test 12/03/2010, no evidence ischemia  . Candidal urethritis in male 06/15/2015  . CKD (chronic kidney disease) stage 3, GFR 30-59 ml/min (HCC)    baseline Cr 1.7  . Closed C7 fracture ([place]) 11/06/2014  . DDD (degenerative disc disease) 03/2013   by CT, diffuse multilevel cervical and lumbar spondylosis  . Dementia   . Depression   . DISH (diffuse idiopathic skeletal hyperostosis) 03/2013   lumbar spine on xray  . Dizziness    multifactorial, s/p PT at [facility] 06/2014 with HEP  . DVT (deep venous thrombosis) ([place]) 07/2016  . Dysphagia   . Elevated PSA    previous-normalized (followed by Dr. Jeffie Pollock, rec no repeat unless urinary sxs)  . Epididymoorchitis 08/21/2017  . Esophageal stenosis   . Essential tremor 09/27/2008   reviewed eval by Dr Jannifer [place] in chart 2011   . Falls   . Gait abnormality 03/16/2017  . GERD (gastroesophageal reflux disease)    h/o PUD  . Hearing loss 06/2012   eval - rec annual exam  . History of fracture of  right hip   . History of phlebitis   . History of pyelonephritis 05/2014   hospitalization with sepsis  . HLD (hyperlipidemia)    hypertriglyceridemia  . HSV 03/08/2007   Qualifier: Diagnosis of  By: Council Mechanic MD, Hilaria Ota   . Hx pulmonary embolism 08/1991   negative hypercoagulable panel 12/2014  . Inguinal hernia   . Macrocytic anemia 2014   stable B12/folate and periph smear 05/2013, again periph smear 2017 with mature cells, mild dacrocytosis  . Mild aortic insufficiency   . Osteoporosis 11/2013   DEXA T score -2.7  . Prediabetes   . Schatzki's ring 02/2012   s/p dilation [name]  . Sleep apnea    no CPAP- sleep apnea  "cleared up 15 years ago"  . Stroke ([place])   . Urge incontinence     Patient Active Problem List   Diagnosis Date Noted  . Acute respiratory failure with hypoxia ([place]) 11/13/2017  . CAP (community acquired pneumonia) 11/23/2017  . Elevated troponin 11/16/2017  . Acute respiratory failure ([place]) 11/29/2017  . Abdominal wall pain in right lower quadrant 11/26/2017  . Chronic left hip pain 07/28/2017  . Long term (current) use of anticoagulants 06/12/2017  . Subacute prostatitis 06/10/2017  . Gait abnormality 03/16/2017  . Acute deep vein thrombosis (DVT) of left tibial vein ([place]) 08/05/2016  . Encounter for current long-term use of anticoagulants 04/20/2016  . Left displaced femoral neck fracture ([place]) 03/11/2016  . History of arterial ischemic stroke 11/16/2015  . 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 Esophageal stenosis   . (HFpEF) heart failure with preserved ejection fraction ([place]) 11/12/2015  . Recurrent UTI 05/13/2015  . Drug rash 04/09/2015  . Chest pain at rest 03/29/2015  . Pedal edema 02/07/2015  . CN (constipation) 01/15/2015  . Orthostatic hypotension 11/24/2014  . Syncope 11/07/2014  . Advanced care planning/counseling discussion 11/06/2014  . Daytime somnolence 11/06/2014  . Macrocytic anemia   . History of colonic polyps 03/27/2014  . Trigger finger, acquired 01/05/2014  . Osteoporosis 11/06/2013  . Dementia 06/08/2013  . Vertigo 01/28/2013  . Sensorineural hearing loss (SNHL) of both ears 06/07/2012  . GERD (gastroesophageal reflux disease) 04/30/2012  . Benign esophageal stricture 12/30/2011  . HLD (hyperlipidemia) 12/08/2011  . MDD (major depressive disorder), recurrent episode, moderate ([place])   . Medicare annual wellness visit, subsequent 06/09/2011  . General unsteadiness 04/16/2010  . Vitamin B12 deficiency 11/01/2008  . Vitamin D deficiency 11/01/2008  . Essential tremor 09/27/2008  . Thrombocytopenia ([place]) 03/08/2007  . Coronary atherosclerosis  03/08/2007  . CKD (chronic kidney disease) stage 3, GFR 30-59 ml/min (HCC) 03/08/2007  . Urge incontinence 03/08/2007  . RENAL CALCULUS, HX OF 03/08/2007  . Phlebitis and thrombophlebitis 01/04/2007  . Personal history of pulmonary embolism 09/08/1990    Past Surgical History:  Procedure Laterality Date  . APPENDECTOMY  1996  . CARDIAC CATHETERIZATION  2000   30% one vessel  . CARDIOVASCULAR STRESS TEST  03/2015   no ischemia, low risk, EF 76%  . CAROTID U/S  12/28/2007   nml  . CATARACT EXTRACTION  09/2009   bilateral  . COLONOSCOPY  1998   [place]  . COLONOSCOPY  07/2010   5 polyps, adenomatous, rec rpt 3 yrs  . COLONOSCOPY  04/2014   3 polyps, adenomatous, f/u open ended given age [name]  . CT ABD W & PELVIS WO CM  07/2001   Scarring of right lung, stable negative o/w  . CT ABD W &  PELVIS WO CM  11/2000   ? stones, right LL scarring  . CYSTOSCOPY  1992   for kidney stones  . EEG  11/12/2009   nml  . ESOPHAGOGASTRODUODENOSCOPY  02/2012   dilation of schatzki's ring [name]  . ESOPHAGOGASTRODUODENOSCOPY N/A 11/15/2015   Procedure: ESOPHAGOGASTRODUODENOSCOPY (EGD);  Surgeon: Gatha Mayer, MD;  Location: [facility] ENDOSCOPY;  Service: Endoscopy;  Laterality: N/A;  . ESOPHAGOGASTRODUODENOSCOPY  11/2016   esophageal erosions, GERD, esophageal stenosis dilated [name]  . ESOPHAGOGASTRODUODENOSCOPY (EGD) WITH PROPOFOL N/A 10/29/2017   dilated esophageal stenosis - Milus Banister, MD  . [place]   with incidental appendectomy  . HERNIA REPAIR  1979   Left  . HIP PINNING     right hip  . HIP PINNING,CANNULATED Left 03/11/2016   Procedure: CANNULATED HIP PINNING;  Surgeon: Rod Can, MD;  Location: [facility];  Service: Orthopedics;  Laterality: Left;  . KNEE ARTHROSCOPY  03/24/02   Right (Dr. Mauri Pole)  . MRI  11/2009   Head, nml  . ORIF FEMORAL NECK FRACTURE W/ DHS Right 08/03/2002   Dr Mauri Pole  . TONSILLECTOMY  1965  . US ECHOCARDIOGRAPHY  12/28/2007    Mild aortic valve calcification EF 55%, basically nml  . V/Q SCAN  06/1999   negative        Home Medications    Prior to Admission medications   Medication Sig Start Date End Date Taking? Authorizing Provider  acetaminophen (TYLENOL) 500 MG tablet Take 1 tablet (500 mg total) by mouth daily. For hip pain, with extra as needed 07/28/17  Yes Ria Bush, MD  b complex vitamins tablet Take 1 tablet by mouth daily.   Yes [provider]  Biotin 2500 MCG CAPS Take 1 capsule by mouth daily.   Yes [provider]  Cholecalciferol (VITAMIN D) 2000 UNITS CAPS Take 2,000 Units by mouth daily. Reported on 01/30/2016   Yes [provider]  Coenzyme Q10 (COQ-10) 200 MG CAPS Take 200 mg by mouth daily.    Yes [provider]  Cyanocobalamin (B-12 PO) Take 10,000 mcg by mouth 3 (three) times a week.    Yes [provider]  Dextran 70-Hypromellose, PF, (ARTIFICIAL TEARS PF) 0.1-0.3 % SOLN Place 1-2 drops into both eyes 3 (three) times daily as needed (for dry eyes).    Yes [provider]  docusate sodium (COLACE) 100 MG capsule Take 100 mg by mouth at bedtime.    Yes [provider]  esomeprazole (NEXIUM) 40 MG capsule Take 40 mg by mouth daily at 12 noon.   Yes [provider]  fenofibrate (TRICOR) 145 MG tablet TAKE 1 TABLET DAILY 11/30/17  Yes Ria Bush, MD  fexofenadine (ALLEGRA) 180 MG tablet Take 180 mg by mouth daily.    Yes [provider]  memantine (NAMENDA) 10 MG tablet Take 20 mg by mouth at bedtime.   Yes [provider]  Multiple Vitamins-Minerals (MULTIVITAMIN PO) Take 1 tablet by mouth daily.    Yes [provider]  rivastigmine (EXELON) 3 MG capsule Take 6 mg by mouth 2 (two) times daily.   Yes [provider]  simvastatin (ZOCOR) 40 MG tablet Take 1 tablet (40 mg total) by mouth daily. 12/09/16  Yes Ria Bush, MD  venlafaxine [facility]) 75 MG tablet TAKE ONE  TABLET TWICE DAILY WITH MEALS 01/06/17  Yes Ria Bush, MD  warfarin (COUMADIN) 2.5 MG tablet TAKE ONE (1) TABLET EACH DAY 10/19/17  Yes Ria Bush, MD  albuterol (PROVENTIL HFA;VENTOLIN HFA) 108 (90 Base) MCG/ACT inhaler Inhale 2 puffs into the lungs every 6 (six) hours as needed for wheezing or shortness of breath. 01/10/16   Ria Bush, MD  clotrimazole (LOTRIMIN) 1 % cream Apply 1 application topically 2 (two) times daily. Patient taking differently: Apply 1 application topically 2 (two) times daily as needed (for rash).  05/02/15   Pleas Koch, NP  DEXILANT 60 MG capsule TAKE 1 CAPSULE DAILY Patient taking differently: Take 60 mg by mouth once a day 02/03/17   Milus Banister, MD    Family History Family History  Problem Relation Age of Onset  . Stroke Mother   . Hypertension Mother   . Cancer Brother        prostate with mets  . Blindness Brother        legally  . Diabetes Brother   . Pulmonary embolism Sister        from shoulder operation  . Alcohol abuse Brother   . Colon cancer Neg Hx   . Esophageal cancer Neg Hx   . Rectal cancer Neg Hx   . Stomach cancer Neg Hx     Social History Social History   Tobacco Use  . Smoking status: Never Smoker  . Smokeless tobacco: Never Used  Substance Use Topics  . Alcohol use: No  . Drug use: No     Allergies   Lactose intolerance (gi); Sulfa antibiotics; Sulfadiazine; Aricept [donepezil hcl]; Lipitor [atorvastatin]; Nizatidine; and Penicillins   Review of Systems Review of Systems  Constitutional: Negative for chills and fever.  HENT: Negative for congestion and facial swelling.   Eyes: Negative for discharge and visual disturbance.  Respiratory: Positive for shortness of breath.   Cardiovascular: Positive for chest pain. Negative for palpitations.  Gastrointestinal: Negative for abdominal pain, diarrhea and vomiting.  Musculoskeletal: Negative for arthralgias and myalgias.  Skin: Negative for  color change and rash.  Neurological: Negative for tremors, syncope and headaches.  Psychiatric/Behavioral: Negative for confusion and dysphoric mood.     Physical Exam Updated Vital Signs BP 115/60   Pulse 93   Temp (!) 101.3 F (38.5 C) (Rectal)   Resp 19   Ht 5\' 6"  (1.676 m)   Wt 78 kg (172 lb)   SpO2 98%   BMI 27.76 kg/m   Physical Exam  Constitutional: He is oriented to person, place, and time. He appears well-developed and well-nourished.  HENT:  Head: Normocephalic and atraumatic.  Eyes: Pupils are equal, round, and reactive to light. EOM are normal.  Neck: Normal range of motion. Neck supple. No JVD present.  Cardiovascular: Normal rate and regular rhythm. Exam reveals no gallop and no friction rub.  No murmur heard. Pulmonary/Chest: No respiratory distress. He has no wheezes.  Abdominal: He exhibits no distension. There is no rebound and no guarding.  Musculoskeletal: Normal range of motion.       Right lower leg: He exhibits edema.       Left lower leg: He exhibits edema.  Trace bilaterally  Neurological: He is alert and oriented to person, place, and time.  Skin: No rash noted. No pallor.  Psychiatric: He has a normal mood and affect. His behavior is normal.  Nursing note and vitals reviewed.    ED Treatments / Results  Labs (all labs ordered are listed, but only abnormal results are displayed) Labs Reviewed  BASIC METABOLIC PANEL - Abnormal; Notable for the following components:      Result Value  Glucose, Bld 142 (*)    BUN 26 (*)    Creatinine, Ser 1.60 (*)    GFR calc non Af Amer 38 (*)    GFR calc Af Amer 44 (*)    All other components within normal limits  CBC - Abnormal; Notable for the following components:   WBC 10.8 (*)    RBC 2.81 (*)    Hemoglobin 10.8 (*)    HCT 33.7 (*)    MCV 119.9 (*)    MCH 38.4 (*)    RDW 16.2 (*)    Platelets 138 (*)    All other components within normal limits  PROTIME-INR - Abnormal; Notable for the  following components:   Prothrombin Time 23.5 (*)    All other components within normal limits  I-STAT TROPONIN, ED - Abnormal; Notable for the following components:   Troponin i, poc 0.76 (*)    All other components within normal limits  I-STAT CHEM 8, ED - Abnormal; Notable for the following components:   BUN 26 (*)    Creatinine, Ser 1.60 (*)    Glucose, Bld 137 (*)    Hemoglobin 10.5 (*)    HCT 31.0 (*)    All other components within normal limits  CULTURE, BLOOD (ROUTINE X 2)  CULTURE, BLOOD (ROUTINE X 2)  CULTURE, EXPECTORATED SPUTUM-ASSESSMENT  GRAM STAIN  INFLUENZA PANEL BY PCR (TYPE A & B)  HEPATIC FUNCTION PANEL  HIV ANTIBODY (ROUTINE TESTING)  STREP PNEUMONIAE URINARY ANTIGEN  D-DIMER, QUANTITATIVE (NOT AT [facility])  TROPONIN I  TROPONIN I  TROPONIN I  PROCALCITONIN  LACTIC ACID, PLASMA  LACTIC ACID, PLASMA    EKG EKG Interpretation  Date/Time:  Wednesday December 02 2017 06:47:54 EDT Ventricular Rate:  88 PR Interval:    QRS Duration: 83 QT Interval:  345 QTC Calculation: 418 R Axis:   -45 Text Interpretation:  Sinus rhythm Atrial premature complex Left anterior fascicular block Low voltage, precordial leads LVH with secondary repolarization abnormality Anterior Q waves, possibly due to LVH No significant change since last tracing Confirmed by Deno Etienne [phone]) on 11/17/2017 7:10:26 AM Also confirmed by Deno Etienne [phone]), editor Philomena Doheny [phone])  on 11/07/2017 8:00:17 AM   Radiology Dg Chest 2 View  Result Date: 11/17/2017 CLINICAL DATA:  Chest pain and shortness of breath EXAM: CHEST - 2 VIEW COMPARISON:  March 02, 2017 FINDINGS: There is patchy airspace opacity in both upper lobes as well as in the right middle lobe and lingular regions. There is a minimal left pleural effusion. Heart is upper normal in size with pulmonary vascularity within normal limits. No adenopathy. No bone lesions. IMPRESSION: Multifocal pneumonia bilaterally. Minimal left pleural  effusion. Heart upper normal in size. Followup PA and lateral chest radiographs recommended in 3-4 weeks following trial of antibiotic therapy to ensure resolution and exclude underlying malignancy. Electronically Signed   By: Lowella Grip III M.D.   On: 12/05/2017 07:49    Procedures Procedures (including critical care time)  Medications Ordered in ED Medications  ipratropium-albuterol (DUONEB) 0.5-2.5 (3) MG/3ML nebulizer solution 3 mL (has no administration in time range)  simvastatin (ZOCOR) tablet 40 mg (has no administration in time range)  rivastigmine (EXELON) capsule 6 mg (6 mg Oral Given 11/06/2017 1512)  memantine (NAMENDA) tablet 20 mg (has no administration in time range)  loratadine (CLARITIN) tablet 10 mg (10 mg Oral Given 11/17/2017 1509)  fenofibrate (TRICOR) tablet 145 mg (has no administration in time range)  pantoprazole (PROTONIX) EC tablet  40 mg (40 mg Oral Given 11/17/2017 1512)  docusate sodium (COLACE) capsule 100 mg (has no administration in time range)  sodium chloride flush (NS) 0.9 % injection 3 mL (has no administration in time range)  sodium chloride flush (NS) 0.9 % injection 3 mL (has no administration in time range)  0.9 %  sodium chloride infusion (has no administration in time range)  acetaminophen (TYLENOL) tablet 650 mg ( Oral See Alternative 11/25/2017 1313)    Or  acetaminophen (TYLENOL) suppository 650 mg (650 mg Rectal Given 11/19/2017 1313)  polyethylene glycol (MIRALAX / GLYCOLAX) packet 17 g (has no administration in time range)  ondansetron (ZOFRAN) tablet 4 mg (has no administration in time range)    Or  ondansetron (ZOFRAN) injection 4 mg (has no administration in time range)  vancomycin (VANCOCIN) IVPB 1000 mg/200 mL premix (has no administration in time range)  ceFEPIme (MAXIPIME) 2 g in sodium chloride 0.9 % 100 mL IVPB (has no administration in time range)  warfarin (COUMADIN) tablet 2.5 mg (has no administration in time range)  Warfarin -  Pharmacist Dosing Inpatient (has no administration in time range)  metroNIDAZOLE (FLAGYL) IVPB 500 mg (500 mg Intravenous New Bag/Given 11/19/2017 1503)  predniSONE (DELTASONE) tablet 20 mg (has no administration in time range)  ipratropium-albuterol (DUONEB) 0.5-2.5 (3) MG/3ML nebulizer solution 3 mL (3 mLs Nebulization Given 11/28/2017 0853)  vancomycin (VANCOCIN) 1,500 mg in sodium chloride 0.9 % 500 mL IVPB (0 mg Intravenous Stopped 11/27/2017 1135)  ceFEPIme (MAXIPIME) 1 g in sodium chloride 0.9 % 100 mL IVPB (0 g Intravenous Stopped 11/14/2017 0935)     Initial Impression / Assessment and Plan / ED Course  I have reviewed the triage vital signs and the nursing notes.  Pertinent labs & imaging results that were available during my care of the patient were reviewed by me and considered in my medical decision making (see chart for details).     84 yo M with a cc of chest pain and sob.  Hx of PE.  Patient with hypoxia on arrival, placed on oxygen with improvement.   The patient has diminished breath sounds in all fields.  Chest x-ray is concerning for multifocal pneumonia.  Will start on antibiotics.  He has no signs of fluid overload on his exam.  He does have a history of PE though he was therapeutic on his Coumadin yesterday.  He thinks this feels different than his prior PE.  CXR with concern for multifocal pneumonia.  The patient has had no cough or fever though.  Started on broad-spectrum antibiotics.  I discussed the case with cardiology as the patient has a positive troponin of 0.7.  I suspect is likely demand ischemia secondary to the pulmonary component though with the acute onset I will have cardiology evaluated bedside.  CRITICAL CARE Performed by: Cecilio Asper   Total critical care time: 35 minutes  Critical care time was exclusive of separately billable procedures and treating other patients.  Critical care was necessary to treat or prevent imminent or life-threatening  deterioration.  Critical care was time spent personally by me on the following activities: development of treatment plan with patient and/or surrogate as well as nursing, discussions with consultants, evaluation of patient's response to treatment, examination of patient, obtaining history from patient or surrogate, ordering and performing treatments and interventions, ordering and review of laboratory studies, ordering and review of radiographic studies, pulse oximetry and re-evaluation of patient's condition.  The patients results and plan were  reviewed and discussed.   Any x-rays performed were independently reviewed by myself.   Differential diagnosis were considered with the presenting HPI.  Medications  ipratropium-albuterol (DUONEB) 0.5-2.5 (3) MG/3ML nebulizer solution 3 mL (has no administration in time range)  simvastatin (ZOCOR) tablet 40 mg (has no administration in time range)  rivastigmine (EXELON) capsule 6 mg (6 mg Oral Given 11/15/2017 1512)  memantine (NAMENDA) tablet 20 mg (has no administration in time range)  loratadine (CLARITIN) tablet 10 mg (10 mg Oral Given 12/05/2017 1509)  fenofibrate (TRICOR) tablet 145 mg (has no administration in time range)  pantoprazole (PROTONIX) EC tablet 40 mg (40 mg Oral Given 11/26/2017 1512)  docusate sodium (COLACE) capsule 100 mg (has no administration in time range)  sodium chloride flush (NS) 0.9 % injection 3 mL (has no administration in time range)  sodium chloride flush (NS) 0.9 % injection 3 mL (has no administration in time range)  0.9 %  sodium chloride infusion (has no administration in time range)  acetaminophen (TYLENOL) tablet 650 mg ( Oral See Alternative 11/15/2017 1313)    Or  acetaminophen (TYLENOL) suppository 650 mg (650 mg Rectal Given 11/27/2017 1313)  polyethylene glycol (MIRALAX / GLYCOLAX) packet 17 g (has no administration in time range)  ondansetron (ZOFRAN) tablet 4 mg (has no administration in time range)    Or    ondansetron (ZOFRAN) injection 4 mg (has no administration in time range)  vancomycin (VANCOCIN) IVPB 1000 mg/200 mL premix (has no administration in time range)  ceFEPIme (MAXIPIME) 2 g in sodium chloride 0.9 % 100 mL IVPB (has no administration in time range)  warfarin (COUMADIN) tablet 2.5 mg (has no administration in time range)  Warfarin - Pharmacist Dosing Inpatient (has no administration in time range)  metroNIDAZOLE (FLAGYL) IVPB 500 mg (500 mg Intravenous New Bag/Given 11/13/2017 1503)  predniSONE (DELTASONE) tablet 20 mg (has no administration in time range)  ipratropium-albuterol (DUONEB) 0.5-2.5 (3) MG/3ML nebulizer solution 3 mL (3 mLs Nebulization Given 11/08/2017 0853)  vancomycin (VANCOCIN) 1,500 mg in sodium chloride 0.9 % 500 mL IVPB (0 mg Intravenous Stopped 11/29/2017 1135)  ceFEPIme (MAXIPIME) 1 g in sodium chloride 0.9 % 100 mL IVPB (0 g Intravenous Stopped 11/26/2017 0935)    Vitals:   11/08/2017 1312 11/24/2017 1318 11/15/2017 1330 11/24/2017 1430  BP:   (!) 112/53 115/60  Pulse:  82 88 93  Resp:  (!) 22 (!) 23 19  Temp: (!) 101.3 F (38.5 C)     TempSrc: Rectal     SpO2:  92% 98% 98%  Weight:      Height:        Final diagnoses:  Multifocal pneumonia  Elevated troponin    Admission/ observation were discussed with the admitting physician, patient and/or family and they are comfortable with the plan.   Final Clinical Impressions(s) / ED Diagnoses   Final diagnoses:  Multifocal pneumonia  Elevated troponin    ED Discharge Orders    None       Deno Etienne, DO 11/16/2017 1536

## 2017-12-02 NOTE — Consult Note (Addendum)
Cardiology Consultation:   Patient ID: [name]; [id number]; 1931/05/22   Admit date: 11/06/2017 Date of Consult: 11/09/2017  Primary Care Provider: Ria Bush, MD Primary Cardiologist: Dr. Fletcher [name] in 2016  Chief Complaint: chest pain  Patient Profile:   [name] is a 84 y.o. 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 Tyrone [name].  History of Present Illness:   Someone in the past has entered in his chart that "cath 2000 30% single vessel" but I cannot find a cath report. He was remotely seen by Dr. Fletcher [name] in 11/2014 for evaluation of syncope vs fall and was a poor historian at that time. 2D echo was unrevealing. 48 hour holter showed NSR with 1st degree AV block, intermittent dropped PACs, nighttime pauses <2 sec, no significant arrhythmias to explain symptoms. He was seen in the hospital 03/2015 by our team for evaluation of chest pain. Nuclear stress test was normal, EF 76%. He was admitted 11/2015 with a stroke which occurred after Coumadin had been stopped because of fall risk. Eliquis was considered but too expensive for the patient, so he was restarted on Coumadin. TEE was unsuccessful due to esophageal stenosis. 2D Echo 11/13/15 showed EF 60-65%, grade 2 DD, mild AI, mild LAE. Apparently at some point Coumadin was stopped again and he had a DVT in 07/2016, therefore was restarted on anticoagulation. (With regard to falls, he has a prior history of R hip fx and C7 fx due to falling, and most recently  sustained a fall/L hip fracture 03/2016.)  The patient is able to provide a decent history to me, and the wife helps to supplement information. She is a retired NP who worked in Visual merchand[employer] for the majority of her career. [name] has not felt well for the last week. Even minimal effort caused him to feel general fatigue. He's been coughing at night and choking on secretions. He has not had a daytime cough. He has not had any falls recently thank goodness. Last night around 2am he awoke with a dull chest pain in the middle of his chest associated w/ SOB. No n/v/diaphoresis. He has significant GERD requiring long-term PPI, so tried Pepto but got no relief. Pain persisted so he called EMS. Triage notes indicate he was hypoxic at 85% RA. He was given ASA per his report by EMS, and by the time he got to ED, symptoms improved. He's been pain free since that time. He does not usually get chest pain so this episode was unusual. Labs reveal Cr 1.6 c/w prior, leukocytosis of 10.8, macrocytic anemia with Hgb 10.8 (similar to 02/2017), INR 2.11, troponin 0.76 (point of care). CXR shows multifocal PNA, minimal left pleural effusion, f/u imaging recommended to exclude malignancy and ensure resolution. He's been started on abx. Vitals reveal HR 80s-90s, low-normal blood pressure, normal O2 sat on [place] O2. He denies any orthopnea or syncope. He has mild trace lower extremity edema which wife and patient say is chronic.  With regard to hypercoagulable state, mother had yearly DVTs. 4 out of the 5 siblings in the family have had  clotting events and sister died of DVT after shoulder surgery.  Past Medical History:  Diagnosis Date  . Allergic rhinitis   . Arthritis   . Asthma    remote  . Blood transfusion 1990's  . CAD (coronary artery disease)    cath 2000 30% single vessel, normal nuclear stress test 12/03/2010, no evidence ischemia  . Candidal urethritis in male 06/15/2015  . CKD (chronic kidney disease)  stage 3, GFR 30-59 ml/min (HCC)    baseline Cr 1.7  . Closed C7 fracture ([place]) 11/06/2014  . DDD (degenerative disc disease) 03/2013   by CT, diffuse multilevel cervical and lumbar spondylosis  . Dementia   . Depression   . DISH (diffuse idiopathic skeletal hyperostosis) 03/2013   lumbar spine on xray  . Dizziness    multifactorial, s/p PT at [facility] 06/2014 with HEP  . DVT (deep venous thrombosis) ([place]) 07/2016  . Dysphagia   . Elevated PSA    previous-normalized (followed by [name], rec no repeat unless urinary sxs)  . Epididymoorchitis 08/21/2017  . Esophageal stenosis   . Essential tremor 09/27/2008   reviewed eval by Dr [name] in chart 2011   . Falls   . Gait abnormality 03/16/2017  . GERD (gastroesophageal reflux disease)    h/o PUD  . Hearing loss 06/2012   eval - rec annual exam  . History of fracture of right hip   . History of phlebitis   . History of pyelonephritis 05/2014   hospitalization with sepsis  . HLD (hyperlipidemia)    hypertriglyceridemia  . HSV 03/08/2007   Qualifier: Diagnosis of  By: [name] Mechanic MD, [name]   . Hx pulmonary embolism 08/1991   negative hypercoagulable panel 12/2014  . Inguinal hernia   . Macrocytic anemia 2014   stable B12/folate and periph smear 05/2013, again periph smear 2017 with mature cells, mild dacrocytosis  . Mild aortic insufficiency   . Osteoporosis 11/2013   DEXA T score -2.7  . Poor historian   . Prediabetes   . Schatzki's ring 02/2012   s/p dilation [name])  . Sleep apnea    no CPAP- sleep apnea "cleared up 15 years ago"  . Stroke ([place])   . Urge incontinence   . UTI (urinary tract infection) due to Enterococcus 05/13/2015    Past Surgical History:  Procedure Laterality Date  . APPENDECTOMY  1996  . CARDIAC CATHETERIZATION  2000   30% one vessel  . CARDIOVASCULAR STRESS TEST  03/2015   no ischemia, low risk, EF 76%  . CAROTID U/S  12/28/2007   nml  . CATARACT EXTRACTION  09/2009   bilateral  . COLONOSCOPY  1998    [place] wnl  . COLONOSCOPY  07/2010   5 polyps, adenomatous, rec rpt 3 yrs  . COLONOSCOPY  04/2014   3 polyps, adenomatous, f/u open ended given age [name])  . CT ABD W & PELVIS WO CM  07/2001   Scarring of right lung, stable negative o/w  . CT ABD W & PELVIS WO CM  11/2000   ? stones, right LL scarring  . CYSTOSCOPY  1992   for kidney stones  . EEG  11/12/2009   nml  . ESOPHAGOGASTRODUODENOSCOPY  02/2012   dilation of schatzki's ring [name])  . ESOPHAGOGASTRODUODENOSCOPY N/A 11/15/2015   Procedure: ESOPHAGOGASTRODUODENOSCOPY (EGD);  Surgeon: Gatha Mayer, MD;  Location: [facility] ENDOSCOPY;  Service: Endoscopy;  Laterality: N/A;  . ESOPHAGOGASTRODUODENOSCOPY  11/2016   esophageal erosions, GERD, esophageal stenosis dilated [name])  .  ESOPHAGOGASTRODUODENOSCOPY (EGD) WITH PROPOFOL N/A 10/29/2017   dilated esophageal stenosis - Milus Banister, MD  . [place]   with incidental appendectomy  . HERNIA REPAIR  1979   Left  . HIP PINNING     right hip  . HIP PINNING,CANNULATED Left 03/11/2016   Procedure: CANNULATED HIP PINNING;  Surgeon: Rod Can, MD;  Location: [facility];  Service: Orthopedics;  Laterality: Left;  . KNEE ARTHROSCOPY  03/24/02   Right (Dr. Mauri Pole)  . MRI  11/2009   Head, nml  . ORIF FEMORAL NECK FRACTURE W/ DHS Right 08/03/2002   Dr Mauri Pole  . TONSILLECTOMY  1965  . US ECHOCARDIOGRAPHY  12/28/2007   Mild aortic valve calcification EF 55%, basically nml  . V/Q SCAN  06/1999   negative     Inpatient Medications: Scheduled Meds:  Continuous Infusions: . vancomycin 1,500 mg (11/19/2017 0935)   PRN Meds:   Home Meds: Prior to Admission medications   Medication Sig Start Date End Date Taking? Authorizing Provider  acetaminophen (TYLENOL) 500 MG tablet Take 1 tablet (500 mg total) by mouth daily. For hip pain, with extra as needed 07/28/17  Yes [name] Bush, MD  b complex vitamins tablet Take 1 tablet by mouth daily.   Yes [provider]  Biotin 2500 MCG CAPS Take 1 capsule by mouth daily.   Yes [provider]  Cholecalciferol (VITAMIN D) 2000 UNITS CAPS Take 2,000 Units by mouth daily. Reported on 01/30/2016   Yes [provider]  Coenzyme Q10 (COQ-10) 200 MG CAPS Take 200 mg by mouth daily.    Yes [provider]  Cyanocobalamin (B-12 PO) Take 10,000 mcg by mouth 3 (three) times a week.    Yes [provider]  Dextran 70-Hypromellose, PF, (ARTIFICIAL TEARS PF) 0.1-0.3 % SOLN Place 1-2 drops into both eyes 3 (three) times daily as needed (for dry eyes).    Yes [provider]  docusate sodium (COLACE) 100 MG capsule Take 100 mg by mouth at bedtime.    Yes [provider]  esomeprazole (NEXIUM) 40 MG capsule Take 40 mg by mouth daily at 12 noon.   Yes [provider]  fenofibrate (TRICOR) 145 MG tablet TAKE 1 TABLET DAILY 11/30/17  Yes [name] Bush, MD  fexofenadine (ALLEGRA) 180 MG tablet Take 180 mg by mouth daily.    Yes [provider]  memantine (NAMENDA) 10 MG tablet Take 20 mg by mouth at bedtime.   Yes [provider]  Multiple Vitamins-Minerals (MULTIVITAMIN PO) Take 1 tablet by mouth daily.    Yes [provider]  rivastigmine (EXELON) 3 MG capsule Take 6 mg by mouth 2 (two) times daily.   Yes [provider]  simvastatin (ZOCOR) 40 MG tablet Take 1 tablet (40 mg total) by mouth daily. 12/09/16  Yes [name] Bush, MD  venlafaxine [facility]) 75 MG tablet TAKE ONE TABLET TWICE DAILY WITH MEALS 01/06/17  Yes [name] Bush, MD  warfarin (COUMADIN) 2.5 MG tablet TAKE ONE (1) TABLET EACH DAY 10/19/17  Yes [name] Bush, MD  albuterol (PROVENTIL HFA;VENTOLIN HFA) 108 (90 Base) MCG/ACT inhaler Inhale 2 puffs into the lungs every 6 (six) hours as needed for wheezing or shortness of breath. 01/10/16   [name] Bush, MD  clotrimazole (LOTRIMIN) 1 % cream Apply 1 application topically 2 (two) times daily. Patient taking  differently: Apply 1 application topically 2 (two) times daily as needed (for rash).  05/02/15   Pleas Koch, NP  DEXILANT 60 MG capsule TAKE 1 CAPSULE DAILY Patient taking differently: Take 60 mg by mouth once a day 02/03/17   Milus Banister, MD    Allergies:    Allergies  Allergen Reactions  . Lactose Intolerance (Gi) Diarrhea  . Sulfa Antibiotics Hives  . Sulfadiazine Hives  . Aricept [Donepezil Hcl] Rash  . Lipitor [Atorvastatin] Rash  . Nizatidine Rash  . Penicillins Rash    Has patient had a PCN reaction causing immediate rash, facial/tongue/throat swelling, SOB or lightheadedness with hypotension: Yes Has patient had a PCN reaction causing severe rash involving mucus membranes or skin necrosis: No Has patient had a PCN reaction that required hospitalization: No Has patient had a PCN reaction occurring within the last 10 years: No If all of the above answers are "NO", then may proceed with Cephalosporin use.     Social History:   Social History   Socioeconomic History  . Marital status: Married    Spouse name: Not on file  . Number of children: 2  . Years of education: Not on file  . Highest education level: Not on file  Occupational History  . 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 Financial resource strain: Not on file  . Food insecurity:    Worry: Not on file    Inability: Not on file  . Transportation needs:    Medical: Not on file    Non-medical: Not on file  Tobacco Use  . Smoking status: Never Smoker  . Smokeless tobacco: Never Used  Substance and Sexual Activity  . Alcohol use: No  . Drug use: No  . Sexual activity: Not Currently  Lifestyle  . Physical activity:    Days per week: Not on file    Minutes per session: Not on file  . Stress: Not on file  Relationships  . Social connections:    Talks on phone: Not on file    Gets together: Not on file    Attends religious service: Not on file    Active  member of club or organization: Not on file    Attends meetings of clubs or organizations: Not on file    Relationship status: Not on file  . Intimate partner violence:    Fear of current or ex partner: Not on file    Emotionally abused: Not on file    Physically abused: Not on file    Forced sexual activity: Not on file  Other Topics Concern  . Not on file  Social History Narrative   Married with 2 children   Husband of Toua Stites    Retired: was principal    Activity: walks dog 2-3 times daily about 14min, frequent stops    Diet: some water, good fruits/vegetables, fish 1x/wk, no sodas.      Advanced directives: Chauncey Reading is wife, [name]. Does not want prolonged life support if terminal      Patient does not drink caffeine.   Patient is right handed.     Family History:   The patient's family history includes Alcohol abuse in his brother; Blindness in his brother; Cancer in his brother; Diabetes in his brother; Hypertension in his mother; Pulmonary embolism in his sister; Stroke in his mother. There is no history of Colon cancer, Esophageal cancer, Rectal cancer, or Stomach cancer.  ROS:  Please see the history of present illness.  All other ROS reviewed and negative.     Physical Exam/Data:   Vitals:  11/11/2017 0930 11/30/2017 0936 11/17/2017 1015 12/01/2017 1100  BP: (!) 113/59  110/64 99/78  Pulse: 92  91 93  Resp: (!) 22  20 19   Temp:  99.4 F (37.4 C)    TempSrc:  Oral    SpO2: 99%  100% 100%  Weight:      Height:        Intake/Output Summary (Last 24 hours) at 12/04/2017 1125 Last data filed at 11/26/2017 0935 Gross per 24 hour  Intake 100 ml  Output -  Net 100 ml   Filed Weights   11/14/2017 0709  Weight: 172 lb (78 kg)   Body mass index is 27.76 kg/m.  General: Elderly WM in no acute distress.  Head: Normocephalic, atraumatic, sclera non-icteric, no xanthomas, nares are without discharge. Facial masking present. Neck: Negative for carotid bruits. JVD not  elevated. Lungs: Clear bilaterally to auscultation without wheezes, rales, or rhonchi. Breathing is unlabored. Heart: RRR with S1 S2. No murmurs, rubs, or gallops appreciated. Abdomen: Soft, non-tender, non-distended with normoactive bowel sounds. No hepatomegaly. No rebound/guarding. No obvious abdominal masses. Msk:  Strength and tone appear normal for age. Extremities: No clubbing or cyanosis. Trace sockline edema.  Distal pedal pulses are 2+ and equal bilaterally. Neuro: Alert and oriented X 3. No facial asymmetry. No focal deficit. Moves all extremities spontaneously. Psych:  Responds to questions appropriately with a normal affect but minimal facial expressions.  EKG:  The EKG was personally reviewed and demonstrates NSR 88bpm, long 1st degree AV bock, nonspecific ST-T changes, LAFB.  Relevant CV Studies: As above  Laboratory Data:  Chemistry Recent Labs  Lab 11/10/2017 0801 11/13/2017 0807  NA 140 142  K 4.8 4.8  CL 107 107  CO2 23  --   GLUCOSE 142* 137*  BUN 26* 26*  CREATININE 1.60* 1.60*  CALCIUM 9.2  --   GFRNONAA 38*  --   GFRAA 44*  --   ANIONGAP 10  --      Hematology Recent Labs  Lab 11/15/2017 0801 11/10/2017 0807  WBC 10.8*  --   RBC 2.81*  --   HGB 10.8* 10.5*  HCT 33.7* 31.0*  MCV 119.9*  --   MCH 38.4*  --   MCHC 32.0  --   RDW 16.2*  --   PLT 138*  --    Cardiac EnzymesNo results for input(s): TROPONINI in the last 168 hours.  Recent Labs  Lab 11/23/2017 0806  TROPIPOC 0.76*    BNPNo results for input(s): BNP, PROBNP in the last 168 hours.  DDimer No results for input(s): DDIMER in the last 168 hours.  Radiology/Studies:  Dg Chest 2 View  Result Date: 11/09/2017 CLINICAL DATA:  Chest pain and shortness of breath EXAM: CHEST - 2 VIEW COMPARISON:  March 02, 2017 FINDINGS: There is patchy airspace opacity in both upper lobes as well as in the right middle lobe and lingular regions. There is a minimal left pleural effusion. Heart is upper normal in  size with pulmonary vascularity within normal limits. No adenopathy. No bone lesions. IMPRESSION: Multifocal pneumonia bilaterally. Minimal left pleural effusion. Heart upper normal in size. Followup PA and lateral chest radiographs recommended in 3-4 weeks following trial of antibiotic therapy to ensure resolution and exclude underlying malignancy. Electronically Signed   By: Lowella Grip III M.D.   On: 11/13/2017 07:49    Assessment and Plan:   1. Chest pain with elevated troponin - initial POC troponin elevated. He is noted to have multifocal  pneumonia on CXR with hypoxia on presentation. Remote ischemic testing was unremarkable. It is not yet clear if this is a demand process or concomitant ACS. In general he does not get chest pain. He does get DOE easily but is very sedentary and spends a large portion of the day sleeping. With his history of recurrent VTE requiring anticoagulation, history of falling, dementia and CKD, he is a poor candidate for cardiac catheterization. I would be concerned about putting this gentleman on additional dual antiplatelet therapy (if he were to require PCI) in addition to his necessary long-term chronic anticoagulation. There has clearly been a back-and-forth struggle in the past to balance fall risk with recurrent VTE risk with Coumadin alone. It appears IM is also working up recurrent PE (I see d-dimer ordered). Recommend to continue to cycle troponins to trend. Blood pressure is chronically in the 90s-low 100s per wife, so would not add beta blocker at this time as it may increase risk of falling. Continue lipid therapy and check lipid profile in AM (may need to consider statin titration). He received ASA by EMS per his and wife's report. Would follow-up troponins and decide tomorrow if we start daily aspirin as well. Per discussion with Dr. Johnsie Cancel, would not convert to heparin at this time as we anticipate conservative management.  2. Multifocal PNA - wife reports  frequent coughing/choking on secretions when he lies down at night. They've tried to raise the head of the bed with no relief. Likely needs eval for aspiration. Further per IM.  3. Recurrent PE/DVT - as above. Anticoag per IM.  4. CKD III - Cr appears at baseline.  5. Macrocytic anemia  - appears at baseline.  Addendum: Dr. Johnsie Cancel addressed Code Status which includes no CPR/shock but does want meds/intubation if needed.  For questions or updates, please contact [place] Please consult [email or web address] for contact info under Cardiology/STEMI.    Signed, Charlie Pitter, PA-C  12/01/2017 11:25 AM   Patient examined chart reviewed. Discussed care with patient wife and PA. Frail 20 y.o. with parkinsons and likely aspiration admitted with atypical chest pain and pneumonia. No documented CAD. Troponin POC .76 no acute ECG changes. Exam with systolic BP 90 mmHg would limit any medical Rx. Frail elderly white male Rhonchi bialterally and poor inspiratory effort. No murmur abdomen soft no edema pedal pulses present. Neuro with UE tremor and jaw tremor with some masked facies Agree that he is not a candidate for aggressive w/u and pain likely from hypoxia and multifocal aspiration pneumonia. Oxygen ASA Check echo make sure EF still normal. Trend Troponins Discussed code status with wife who is a retired Marine scientist. They would like intubation but no CPR or defibrillation.   Jenkins Rouge

## 2017-12-02 NOTE — ED Notes (Signed)
Pt was changed, wet depend.  Skin intact.  Repositioned in bed.

## 2017-12-02 NOTE — ED Notes (Signed)
Date and time results received: 11/18/2017 4:33 PM    Test: Troponin Critical Value: 1.12  Name of Provider Notified: Lorin Mercy  Orders Received? Or Actions Taken?:Text paged

## 2017-12-03 ENCOUNTER — Inpatient Hospital Stay (HOSPITAL_COMMUNITY): Payer: Medicare Other

## 2017-12-03 DIAGNOSIS — I351 Nonrheumatic aortic (valve) insufficiency: Secondary | ICD-10-CM

## 2017-12-03 LAB — LIPID PANEL
Cholesterol: 130 mg/dL (ref 0–200)
HDL: 15 mg/dL — ABNORMAL LOW (ref >40)
LDL Cholesterol: 69 mg/dL (ref 0–99)
Total CHOL/HDL Ratio: 8.7 RATIO
Triglycerides: 231 mg/dL — ABNORMAL HIGH (ref <150)
VLDL: 46 mg/dL — ABNORMAL HIGH (ref 0–40)

## 2017-12-03 LAB — HEPATIC FUNCTION PANEL
ALT: 27 U/L (ref 17–63)
AST: 47 U/L — AB (ref 15–41)
Albumin: 2.9 g/dL — ABNORMAL LOW (ref 3.5–5.0)
Alkaline Phosphatase: 42 U/L (ref 38–126)
BILIRUBIN DIRECT: 0.9 mg/dL — AB (ref 0.1–0.5)
Indirect Bilirubin: 1.2 mg/dL — ABNORMAL HIGH (ref 0.3–0.9)
TOTAL PROTEIN: 5.6 g/dL — AB (ref 6.5–8.1)
Total Bilirubin: 2.1 mg/dL — ABNORMAL HIGH (ref 0.3–1.2)

## 2017-12-03 LAB — ECHOCARDIOGRAM COMPLETE
Height: 66 in
Weight: 2779.56 oz

## 2017-12-03 LAB — CBC
HCT: 31.5 % — ABNORMAL LOW (ref 39.0–52.0)
Hemoglobin: 10.1 g/dL — ABNORMAL LOW (ref 13.0–17.0)
MCH: 38.7 pg — ABNORMAL HIGH (ref 26.0–34.0)
MCHC: 32.1 g/dL (ref 30.0–36.0)
MCV: 120.7 fL — ABNORMAL HIGH (ref 78.0–100.0)
Platelets: 145 K/uL — ABNORMAL LOW (ref 150–400)
RBC: 2.61 MIL/uL — ABNORMAL LOW (ref 4.22–5.81)
RDW: 16.7 % — ABNORMAL HIGH (ref 11.5–15.5)
WBC: 8.4 K/uL (ref 4.0–10.5)

## 2017-12-03 LAB — POTASSIUM: Potassium: 4.7 mmol/L (ref 3.5–5.1)

## 2017-12-03 LAB — BASIC METABOLIC PANEL
Anion gap: 8 (ref 5–15)
BUN: 31 mg/dL — AB (ref 6–20)
CO2: 23 mmol/L (ref 22–32)
CREATININE: 1.94 mg/dL — AB (ref 0.61–1.24)
Calcium: 8.6 mg/dL — ABNORMAL LOW (ref 8.9–10.3)
Chloride: 108 mmol/L (ref 101–111)
GFR calc Af Amer: 35 mL/min — ABNORMAL LOW (ref >60)
GFR, EST NON AFRICAN AMERICAN: 30 mL/min — AB (ref >60)
GLUCOSE: 190 mg/dL — AB (ref 65–99)
Potassium: 5.3 mmol/L — ABNORMAL HIGH (ref 3.5–5.1)
SODIUM: 139 mmol/L (ref 135–145)

## 2017-12-03 LAB — PROTIME-INR
INR: 2.15
Prothrombin Time: 23.8 s — ABNORMAL HIGH (ref 11.4–15.2)

## 2017-12-03 LAB — PROCALCITONIN: Procalcitonin: 0.78 ng/mL

## 2017-12-03 LAB — HIV ANTIBODY (ROUTINE TESTING W REFLEX): HIV Screen 4th Generation wRfx: NONREACTIVE

## 2017-12-03 LAB — TROPONIN I: TROPONIN I: 2.25 ng/mL — AB (ref <0.03)

## 2017-12-03 MED ORDER — ORAL CARE MOUTH RINSE
15.0000 mL | Freq: Two times a day (BID) | OROMUCOSAL | Status: DC
  Administered 2017-12-03 – 2017-12-07 (×6): 15 mL via OROMUCOSAL

## 2017-12-03 MED ORDER — WARFARIN SODIUM 2.5 MG PO TABS
2.5000 mg | ORAL_TABLET | Freq: Once | ORAL | Status: AC
  Administered 2017-12-03: 2.5 mg via ORAL
  Filled 2017-12-03: qty 1

## 2017-12-03 MED ORDER — WARFARIN SODIUM 2.5 MG PO TABS
2.5000 mg | ORAL_TABLET | Freq: Once | ORAL | Status: DC
  Filled 2017-12-03: qty 1

## 2017-12-03 MED ORDER — ASPIRIN EC 81 MG PO TBEC
81.0000 mg | DELAYED_RELEASE_TABLET | Freq: Every day | ORAL | Status: DC
  Administered 2017-12-03 – 2017-12-06 (×3): 81 mg via ORAL
  Filled 2017-12-03 (×3): qty 1

## 2017-12-03 NOTE — Progress Notes (Signed)
0500 labs showing pts K is 5.3. Messaged attending MD to relay

## 2017-12-03 NOTE — Progress Notes (Signed)
  Echocardiogram 2D Echocardiogram has been performed.  [name] 12/03/2017, 1:51 PM

## 2017-12-03 NOTE — Progress Notes (Signed)
PT Cancellation Note  Patient Details Name: [name] MRN: [id number] DOB: 1931-08-23   Cancelled Treatment:    Reason Eval/Treat Not Completed: Medical issues which prohibited therapy(Troponins trending up.  Will check back when troponins decreasing.  Nurse agrees.)   Denice Paradise 12/03/2017, 9:34 AM [facility] [phone] (pager)

## 2017-12-03 NOTE — Progress Notes (Signed)
Pts 3rd troponin now 2.25.  Messaged covering cardiologist, Dr. Johnsie Cancel, and attending MD, Dr. Wendee Beavers

## 2017-12-03 NOTE — Progress Notes (Signed)
Pt c/o 8/10 CP with a noticably increased work of breathing. Pt oxygen saturation was decreasing into the 80's, O2 increased from 4 L/min to 6 L/min. Increased bilateral expiratory wheezes and bilateral coarse crackled auscultated in the lung bases. Pt cool to touch and diaphoretic, rectal temp 98.0.  An EKG was obtained and on call cardiology and on call NP Blount were paged.  NP Blount ordered BiPAP, an ABG, and a transfer to stepdown level of care.   Pt was placed on BiPAP and is now resting more comfortably, VSS.  Will continue to monitor pt closely.

## 2017-12-03 NOTE — Progress Notes (Signed)
ANTICOAGULATION CONSULT NOTE - Follow-Up Consult  Pharmacy Consult for Warfarin Indication: hx PE/DVT   Patient Measurements: Height: 5\' 6"  (167.6 cm) Weight: 173 lb 11.6 oz (78.8 kg) IBW/kg (Calculated) : 63.8  Vital Signs: Temp: 97.7 F (36.5 C) (03/28 0818) Temp Source: Oral (03/28 0818) BP: 108/94 (03/28 0818) Pulse Rate: 80 (03/28 0818)  Labs: Recent Labs    12/01/17  11/12/2017 0801 11/22/2017 0807 12/06/2017 1444 11/28/2017 1830 12/03/17 0003 12/03/17 0522  HGB  --    < > 10.8* 10.5*  --   --   --  10.1*  HCT  --   --  33.7* 31.0*  --   --   --  31.5*  PLT  --   --  138*  --   --   --   --  145*  LABPROT  --   --  23.5*  --   --   --   --  23.8*  INR 2.3  --  2.11  --   --   --   --  2.15  CREATININE  --   --  1.60* 1.60*  --   --   --  1.94*  TROPONINI  --   --   --   --  1.12* 1.37* 2.25*  --    < > = values in this interval not displayed.    Estimated Creatinine Clearance: 28 mL/min (A) (by C-G formula based on SCr of 1.94 mg/dL (H)).   Assessment: 5 YOM with on warfarin at home for remote hx of extensive PE/DVT presents with chest pain and shortness of breath. Pharmacy consulted to resume warfarin for anticoagulation.   INR this morning remains therapeutic (INR 2.15 << 2.11, goal of 2-3). Hgb/Hct/Plt wnl. No overt s/sx of bleeding noted. Flagyl started on 3/27 which is known to increase warfarin sensitivity - will monitor closely.  Goal of Therapy:  INR 2-3 Monitor platelets by anticoagulation protocol: Yes   Plan:  - Warfarin 2.5 mg x 1 dose at 1800 today - Will continue to monitor for any signs/symptoms of bleeding and will follow up with PT/INR in the a.m.  Thank you for allowing pharmacy to be a part of this patient's care.  Alycia Rossetti, PharmD, BCPS Clinical Pharmacist Pager: [phone] Clinical phone for 12/03/2017 from 7a-3:30p: [phone] If after 3:30p, please call main pharmacy at: [phone] 12/03/2017 10:57 AM

## 2017-12-03 NOTE — Progress Notes (Signed)
Modified Barium Swallow Progress Note  Patient Details  Name: [name] MRN: [id number] Date of Birth: [birth year]  Today's Date: 12/03/2017  Modified Barium Swallow completed.  Full report located under Chart Review in the Imaging Section.  Brief recommendations include the following:  Clinical Impression  Pt demonstrates signs of a primary, apparently severe esophageal dysphagia that is likely the principal risk factor for an aspiration pnemonia. There is also a mild to moderate oropharyngeal dysphagia due to structural deficits. There is an appearance of bony protrusions on the cervical esophagus that impede epiglottic deflection, vestibular closure and UES opening. This results in consistent high or frank penetration of thin liquids with adequate glottic closure. One instance of liquids squeezing past the cords was immediately sensed by pt with a forceful throat clear. Pt clears throat quite frequently, but a cues for a HARDER throat clear fully ejects penetrates and a second swallow clears pharyngeal residuals. No strategies eliminated or reduced penetration or residuals. Esophageal sweep shows severe stasis without any observable peristalsis, no radiologist present to confirm. Recommend pt continue a regular diet and thin liquids with SLP f/u to encourage apiration prevention strategies and therapeutic interventions to maintain pts expiratory strength (RMST). F/u with GI may be warranted.    Swallow Evaluation Recommendations   Recommended Consults: Consider GI evaluation   SLP Diet Recommendations: Regular solids;Thin liquid   Liquid Administration via: Straw   Medication Administration: Whole meds with puree   Supervision: Patient able to self feed;Staff to assist with self feeding;Full supervision/cueing for compensatory strategies   Compensations: Slow rate;Small sips/bites;Clear throat after each swallow;Follow solids with liquid;Multiple dry swallows after each bite/sip   Postural Changes: Remain semi-upright after after feeds/meals (Comment);Seated upright at 90 degrees   Oral Care Recommendations: Oral care BID       Herbie [place], MA CCC-SLP [phone]  Jourdan Maldonado, [name] 12/03/2017,1:35 PM

## 2017-12-03 NOTE — Progress Notes (Signed)
Subjective:  Had some "tightness" and low sats around midnight No pain this am Troponin up   Objective:  Vitals:   11/20/2017 2148 11/17/2017 2330 12/03/17 0650 12/03/17 0818  BP:  (!) 89/41  (!) 108/94  Pulse: 96 98  80  Resp: 20 18  20   Temp:  100.1 F (37.8 C) 97.9 F (36.6 C) 97.7 F (36.5 C)  TempSrc:  Rectal Rectal Oral  SpO2: 95% 94%  98%  Weight:      Height:        Intake/Output from previous day:  Intake/Output Summary (Last 24 hours) at 12/03/2017 1751 Last data filed at 12/03/2017 0900 Gross per 24 hour  Intake 1053 ml  Output 50 ml  Net 1003 ml    Physical Exam: Affect appropriate Elderly white male  HEENT: normal Neck supple with no adenopathy JVP normal no bruits no thyromegaly Lungs clear with no wheezing and good diaphragmatic motion Heart:  S1/S2 no murmur, no rub, gallop or click PMI normal Abdomen: benighn, BS positve, no tenderness, no AAA no bruit.  No HSM or HJR Distal pulses intact with no bruits No edema Neuro non-focal Parkinsons  Skin warm and dry No muscular weakness   Lab Results: Basic Metabolic Panel: Recent Labs    12/03/2017 0801 11/15/2017 0807 12/03/17 0522  NA 140 142 139  K 4.8 4.8 5.3*  CL 107 107 108  CO2 23  --  23  GLUCOSE 142* 137* 190*  BUN 26* 26* 31*  CREATININE 1.60* 1.60* 1.94*  CALCIUM 9.2  --  8.6*   Liver Function Tests: Recent Labs    12/03/17 0003  AST 47*  ALT 27  ALKPHOS 42  BILITOT 2.1*  PROT 5.6*  ALBUMIN 2.9*   No results for input(s): LIPASE, AMYLASE in the last 72 hours. CBC: Recent Labs    11/21/2017 0801 11/12/2017 0807 12/03/17 0522  WBC 10.8*  --  8.4  HGB 10.8* 10.5* 10.1*  HCT 33.7* 31.0* 31.5*  MCV 119.9*  --  120.7*  PLT 138*  --  145*   Cardiac Enzymes: Recent Labs    11/19/2017 1444 12/05/2017 1830 12/03/17 0003  TROPONINI 1.12* 1.37* 2.25*   BNP: Invalid input(s): POCBNP D-Dimer: Recent Labs    11/29/2017 1444  DDIMER <0.27   Hemoglobin A1C: No results for  input(s): HGBA1C in the last 72 hours. Fasting Lipid Panel: Recent Labs    12/03/17 0522  CHOL 130  HDL 15*  LDLCALC 69  TRIG 231*  CHOLHDL 8.7    Imaging: Dg Chest 2 View  Result Date: 11/20/2017 CLINICAL DATA:  Chest pain and shortness of breath EXAM: CHEST - 2 VIEW COMPARISON:  March 02, 2017 FINDINGS: There is patchy airspace opacity in both upper lobes as well as in the right middle lobe and lingular regions. There is a minimal left pleural effusion. Heart is upper normal in size with pulmonary vascularity within normal limits. No adenopathy. No bone lesions. IMPRESSION: Multifocal pneumonia bilaterally. Minimal left pleural effusion. Heart upper normal in size. Followup PA and lateral chest radiographs recommended in 3-4 weeks following trial of antibiotic therapy to ensure resolution and exclude underlying malignancy. Electronically Signed   By: Lowella Grip III M.D.   On: 11/11/2017 07:49    Cardiac Studies:  ECG: SR T inversions I,AVL no acute changes    Telemetry:  NSR no arrhythmia 12/03/2017   Echo: pending   Medications:   . docusate sodium  100 mg Oral QHS  .  fenofibrate  160 mg Oral Daily  . loratadine  10 mg Oral Daily  . mouth rinse  15 mL Mouth Rinse BID  . memantine  20 mg Oral QHS  . pantoprazole  40 mg Oral Daily  . predniSONE  20 mg Oral Q breakfast  . rivastigmine  6 mg Oral BID  . simvastatin  40 mg Oral q1800  . sodium chloride flush  3 mL Intravenous Q12H  . Warfarin - Pharmacist Dosing Inpatient   Does not apply q1800     . sodium chloride    . ceFEPime (MAXIPIME) IV    . metronidazole Stopped (12/03/17 0747)  . vancomycin 1,000 mg (12/03/17 0835)    Assessment/Plan:  Pneumonia:  Likely aspiration multi lobar sats ok continue 3 antibiotic RX SEMI:  Stable hemodynamics echo pending no pain conservative strategy Given age parkinsons renal failure and partial code status He is on coumadin For DVT/PE and chronic hypercoagulable state so no  heparin check INR pharmacy Dosing   Jenkins Rouge 12/03/2017, 9:18 AM

## 2017-12-03 NOTE — Progress Notes (Addendum)
Progress Note  Patient Name: [name] Date of Encounter: 12/03/2017  Primary Cardiologist: Dr. Johnsie Cancel  Subjective   Has been intermittently febrile. Had CP once more during an episode of hypoxia, was given 325mg  of ASA and be which improved symptoms -> has been able to sleep since that time.  Inpatient Medications    Scheduled Meds: . docusate sodium  100 mg Oral QHS  . fenofibrate  160 mg Oral Daily  . loratadine  10 mg Oral Daily  . mouth rinse  15 mL Mouth Rinse BID  . memantine  20 mg Oral QHS  . pantoprazole  40 mg Oral Daily  . predniSONE  20 mg Oral Q breakfast  . rivastigmine  6 mg Oral BID  . simvastatin  40 mg Oral q1800  . sodium chloride flush  3 mL Intravenous Q12H  . Warfarin - Pharmacist Dosing Inpatient   Does not apply q1800   Continuous Infusions: . sodium chloride    . ceFEPime (MAXIPIME) IV    . metronidazole Stopped (12/03/17 0747)  . vancomycin 1,000 mg (12/03/17 0835)   PRN Meds: sodium chloride, acetaminophen **OR** acetaminophen, ipratropium-albuterol, ondansetron **OR** ondansetron (ZOFRAN) IV, polyethylene glycol, sodium chloride flush   Vital Signs    Vitals:   11/12/2017 2148 12/01/2017 2330 12/03/17 0650 12/03/17 0818  BP:  (!) 89/41  (!) 108/94  Pulse: 96 98  80  Resp: 20 18  20   Temp:  100.1 F (37.8 C) 97.9 F (36.6 C) 97.7 F (36.5 C)  TempSrc:  Rectal Rectal Oral  SpO2: 95% 94%  98%  Weight:      Height:        Intake/Output Summary (Last 24 hours) at 12/03/2017 0904 Last data filed at 12/03/2017 0730 Gross per 24 hour  Intake 813 ml  Output 50 ml  Net 763 ml   Filed Weights   11/11/2017 0709 11/11/2017 1800  Weight: 172 lb (78 kg) 173 lb 11.6 oz (78.8 kg)    Telemetry    nsr - Personally Reviewed  Physical Exam   GEN: No acute distress, elderly HEENT: Normocephalic, atraumatic, sclera non-icteric, masked facies Neck: No JVD or bruits. Cardiac: RRR no murmurs, rubs, or gallops.  Radials/DP/PT 1+ and equal  bilaterally.  Respiratory: Clear to auscultation bilaterally anteriorly. Breathing is unlabored. GI: Soft, nontender, non-distended, BS +x 4. MS: no deformity. Extremities: No clubbing or cyanosis. No edema. Distal pedal pulses are 2+ and equal bilaterally. Neuro:  AAOx3, just waking up this AM. Follows commands. Psych:  Responds to questions appropriately with a normal affect.  Labs    Chemistry Recent Labs  Lab 11/24/2017 0801 11/08/2017 0807 12/03/17 0003 12/03/17 0522  NA 140 142  --  139  K 4.8 4.8  --  5.3*  CL 107 107  --  108  CO2 23  --   --  23  GLUCOSE 142* 137*  --  190*  BUN 26* 26*  --  31*  CREATININE 1.60* 1.60*  --  1.94*  CALCIUM 9.2  --   --  8.6*  PROT  --   --  5.6*  --   ALBUMIN  --   --  2.9*  --   AST  --   --  47*  --   ALT  --   --  27  --   ALKPHOS  --   --  42  --   BILITOT  --   --  2.1*  --  GFRNONAA 38*  --   --  30*  GFRAA 44*  --   --  35*  ANIONGAP 10  --   --  8     Hematology Recent Labs  Lab 11/18/2017 0801 11/26/2017 0807 12/03/17 0522  WBC 10.8*  --  8.4  RBC 2.81*  --  2.61*  HGB 10.8* 10.5* 10.1*  HCT 33.7* 31.0* 31.5*  MCV 119.9*  --  120.7*  MCH 38.4*  --  38.7*  MCHC 32.0  --  32.1  RDW 16.2*  --  16.7*  PLT 138*  --  145*    Cardiac Enzymes Recent Labs  Lab 11/21/2017 1444 11/29/2017 1830 12/03/17 0003  TROPONINI 1.12* 1.37* 2.25*    Recent Labs  Lab 11/09/2017 0806  TROPIPOC 0.76*     BNPNo results for input(s): BNP, PROBNP in the last 168 hours.   DDimer  Recent Labs  Lab 11/26/2017 1444  DDIMER <0.27     Radiology    Dg Chest 2 View  Result Date: 11/09/2017 CLINICAL DATA:  Chest pain and shortness of breath EXAM: CHEST - 2 VIEW COMPARISON:  March 02, 2017 FINDINGS: There is patchy airspace opacity in both upper lobes as well as in the right middle lobe and lingular regions. There is a minimal left pleural effusion. Heart is upper normal in size with pulmonary vascularity within normal limits. No  adenopathy. No bone lesions. IMPRESSION: Multifocal pneumonia bilaterally. Minimal left pleural effusion. Heart upper normal in size. Followup PA and lateral chest radiographs recommended in 3-4 weeks following trial of antibiotic therapy to ensure resolution and exclude underlying malignancy. Electronically Signed   By: Lowella Grip III M.D.   On: 11/08/2017 07:49    Cardiac Studies   2D echo pending  Patient Profile     84 y.o. 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 Tyrone Nine. Admitted with 1 week hx of fatigue/dyspnea, also several hours of CP the day of admission - found to have multifocal PNA,  Assessment & Plan    1. Chest pain with elevated troponin - remains unclear if this is due to demand ischemia or underlying CAD. We do suspect that hypoxia precipitated his chest pain in the setting of PNA. He spends majority of the day sleeping so overall poor functional status at baseline. With his history of recurrent VTE requiring full anticoagulation, history of falling, dementia and CKD, he is a poor candidate for cardiac catheterization. We do not feel he would be a candidate for addition of Plavix to his long-term anticoagulation because of fall risk. There has clearly been a back-and-forth struggle in the past to balance fall risk with recurrent VTE risk with Coumadin alone. Blood pressure is chronically in the 90s-low 100s per wife, so would not add beta blocker at this time as it may increase risk of falling.  Considered addition of low dose nitrate but wife (retired NP) is really concerned about this as he has been exquisitely sensitive to BP drops/meds in the past. LDL controlled on current regimen. Will discuss with MD whether to add standing aspirin, has  gotten intermittently this adm. 2D echo is pending.  2. Multifocal PNA - wife reports frequent coughing/choking on secretions when he lies down at night. They've tried to raise the head of the bed with no relief. Likely needs eval for aspiration. Further per IM.  3. Recurrent PE/DVT - as above. He is on chronic anticoagulation.  4. CKD III - slight increase from prior with mild hyperkalemia, further per primary team.  5. Macrocytic anemia  - appears near baseline.  For questions or updates, please contact [facility] Please consult [email or web address] for contact info under Cardiology/STEMI.  Signed, Charlie Pitter, PA-C 12/03/2017, 9:04 AM    See separate progress note from me today  Jenkins Rouge

## 2017-12-03 NOTE — Progress Notes (Signed)
PROGRESS NOTE    [name]  DGU:[id number] DOB: 01-18-1931 DOA: 11/26/2017 PCP: Ria Bush, MD    Brief Narrative:  84 y/o pt with history of recurrent PE/DVT on chronic anticoagulation who presented with chest pain and elevated troponins. Dx with pna on work up.   Assessment & Plan:   Principal Problem:   Acute respiratory failure with hypoxia (HCC) - 2ary to pna - continue supportive therapy with supplemental oxygen.    Elevated troponin - per cardiology. No aggressive work up planned. Plan is for medical management.  Active Problems:   CKD (chronic kidney disease) stage 3, GFR 30-59 ml/min (HCC)   Essential tremor   HLD (hyperlipidemia)   GERD (gastroesophageal reflux disease)   Dementia   Macrocytic anemia   (HFpEF) heart failure with preserved ejection fraction (HCC)   Elevated factor VIII level   Long term (current) use of anticoagulants   CAP (community acquired pneumonia)   DVT prophylaxis: Warfarin Code Status: Partial: intubation but no chest compressions Family Communication: d/c family today Disposition Plan: pending improvement in respiratory status   Consultants:   cardiology   Procedures: none   Antimicrobials: cefepime, flagyl, vancomycin   Subjective: Pt has no new complaints currently.   Objective: Vitals:   11/25/2017 2148 11/30/2017 2330 12/03/17 0650 12/03/17 0818  BP:  (!) 89/41  (!) 108/94  Pulse: 96 98  80  Resp: 20 18  20   Temp:  100.1 F (37.8 C) 97.9 F (36.6 C) 97.7 F (36.5 C)  TempSrc:  Rectal Rectal Oral  SpO2: 95% 94%  98%  Weight:      Height:        Intake/Output Summary (Last 24 hours) at 12/03/2017 1402 Last data filed at 12/03/2017 1203 Gross per 24 hour  Intake 453 ml  Output 251 ml  Net 202 ml   Filed Weights   11/09/2017 0709 11/27/2017 1800  Weight: 78 kg (172 lb) 78.8 kg (173 lb 11.6 oz)    Examination:  General exam: Appears calm and comfortable, in nad.  Respiratory system: rhales, no  wheezes, equal chest rise.  Cardiovascular system: S1 & S2 heard, RRR.  Gastrointestinal system: Abdomen is nondistended, soft and nontender.  Central nervous system: Alert and oriented. No focal neurological deficits. Extremities: warm and dry. Skin: No rashes, lesions or ulcers, on limited exam. Psychiatry:  Mood & affect appropriate.    Data Reviewed: I have personally reviewed following labs and imaging studies  CBC: Recent Labs  Lab 12/01/2017 0801 11/17/2017 0807 12/03/17 0522  WBC 10.8*  --  8.4  HGB 10.8* 10.5* 10.1*  HCT 33.7* 31.0* 31.5*  MCV 119.9*  --  120.7*  PLT 138*  --  956*   Basic Metabolic Panel: Recent Labs  Lab 11/12/2017 0801 12/01/2017 0807 12/03/17 0522  NA 140 142 139  K 4.8 4.8 5.3*  CL 107 107 108  CO2 23  --  23  GLUCOSE 142* 137* 190*  BUN 26* 26* 31*  CREATININE 1.60* 1.60* 1.94*  CALCIUM 9.2  --  8.6*   GFR: Estimated Creatinine Clearance: 28 mL/min (A) (by C-G formula based on SCr of 1.94 mg/dL (H)). Liver Function Tests: Recent Labs  Lab 12/03/17 0003  AST 47*  ALT 27  ALKPHOS 42  BILITOT 2.1*  PROT 5.6*  ALBUMIN 2.9*   No results for input(s): LIPASE, AMYLASE in the last 168 hours. No results for input(s): AMMONIA in the last 168 hours. Coagulation Profile: Recent Labs  Lab  12/01/17 12/04/2017 0801 12/03/17 0522  INR 2.3 2.11 2.15   Cardiac Enzymes: Recent Labs  Lab 11/06/2017 1444 11/29/2017 1830 12/03/17 0003  TROPONINI 1.12* 1.37* 2.25*   BNP (last 3 results) No results for input(s): PROBNP in the last 8760 hours. HbA1C: No results for input(s): HGBA1C in the last 72 hours. CBG: No results for input(s): GLUCAP in the last 168 hours. Lipid Profile: Recent Labs    12/03/17 0522  CHOL 130  HDL 15*  LDLCALC 69  TRIG 231*  CHOLHDL 8.7   Thyroid Function Tests: No results for input(s): TSH, T4TOTAL, FREET4, T3FREE, THYROIDAB in the last 72 hours. Anemia Panel: No results for input(s): VITAMINB12, FOLATE, FERRITIN,  TIBC, IRON, RETICCTPCT in the last 72 hours. Sepsis Labs: Recent Labs  Lab 11/16/2017 1444 11/16/2017 1830 12/03/17 0522  PROCALCITON 0.58  --  0.78  LATICACIDVEN 1.7 1.8  --     Recent Results (from the past 240 hour(s))  Culture, blood (routine x 2) Call MD if unable to obtain prior to antibiotics being given     Status: None (Preliminary result)   Collection Time: 11/25/2017  2:44 PM  Result Value Ref Range Status   Specimen Description BLOOD LEFT HAND  Final   Special Requests   Final    BOTTLES DRAWN AEROBIC ONLY Blood Culture results may not be optimal due to an inadequate volume of blood received in culture bottles   Culture   Final    NO GROWTH < 24 HOURS Performed at [facility], [place] [address], [place], [place] [address]    Report Status PENDING  Incomplete  Culture, blood (routine x 2) Call MD if unable to obtain prior to antibiotics being given     Status: None (Preliminary result)   Collection Time: 11/25/2017  3:09 PM  Result Value Ref Range Status   Specimen Description BLOOD RIGHT HAND  Final   Special Requests   Final    BOTTLES DRAWN AEROBIC AND ANAEROBIC Blood Culture adequate volume   Culture   Final    NO GROWTH < 24 HOURS Performed at [facility], [address] [place], [place] [address]    Report Status PENDING  Incomplete         Radiology Studies: Dg Chest 2 View  Result Date: 11/19/2017 CLINICAL DATA:  Chest pain and shortness of breath EXAM: CHEST - 2 VIEW COMPARISON:  March 02, 2017 FINDINGS: There is patchy airspace opacity in both upper lobes as well as in the right middle lobe and lingular regions. There is a minimal left pleural effusion. Heart is upper normal in size with pulmonary vascularity within normal limits. No adenopathy. No bone lesions. IMPRESSION: Multifocal pneumonia bilaterally. Minimal left pleural effusion. Heart upper normal in size. Followup PA and lateral chest radiographs recommended in 3-4 weeks following trial of  antibiotic therapy to ensure resolution and exclude underlying malignancy. Electronically Signed   By: Lowella Grip III M.D.   On: 12/06/2017 07:49   Dg Swallowing Func-speech Pathology  Result Date: 12/03/2017 Objective Swallowing Evaluation: Type of Study: MBS-Modified Barium Swallow Study  Patient Details Name: [name] MRN: [id number] Date of Birth: 1931/02/17 Today's Date: 12/03/2017 Time: SLP Start Time (ACUTE ONLY): 0945 -SLP Stop Time (ACUTE ONLY): 1015 SLP Time Calculation (min) (ACUTE ONLY): 30 min Past Medical History: Past Medical History: Diagnosis Date . Allergic rhinitis  . Arthritis  . Blood transfusion 1990's . CAD (coronary artery disease)   cath 2000 30%  single vessel, normal nuclear stress test 12/03/2010, no evidence ischemia . Candidal urethritis in male 06/15/2015 . CKD (chronic kidney disease) stage 3, GFR 30-59 ml/min (HCC)   baseline Cr 1.7 . Closed C7 fracture ([place]) 11/06/2014 . DDD (degenerative disc disease) 03/2013  by CT, diffuse multilevel cervical and lumbar spondylosis . Dementia  . Depression  . DISH (diffuse idiopathic skeletal hyperostosis) 03/2013  lumbar spine on xray . Dizziness   multifactorial, s/p PT at [facility] 06/2014 with HEP . DVT (deep venous thrombosis) ([place]) 07/2016 . Dysphagia  . Elevated PSA   previous-normalized (followed by Dr. Jeffie Pollock, rec no repeat unless urinary sxs) . Epididymoorchitis 08/21/2017 . Esophageal stenosis  . Essential tremor 09/27/2008  reviewed eval by Dr Jannifer Franklin in chart 2011  . Falls  . Gait abnormality 03/16/2017 . GERD (gastroesophageal reflux disease)   h/o PUD . Hearing loss 06/2012  eval - rec annual exam . History of fracture of right hip  . History of phlebitis  . History of pyelonephritis 05/2014  hospitalization with sepsis . HLD (hyperlipidemia)   hypertriglyceridemia . HSV 03/08/2007  Qualifier: Diagnosis of  By: Council Mechanic MD, Hilaria Ota  . Hx pulmonary embolism 08/1991  negative hypercoagulable panel 12/2014 . Inguinal hernia  .  Macrocytic anemia 2014  stable B12/folate and periph smear 05/2013, again periph smear 2017 with mature cells, mild dacrocytosis . Mild aortic insufficiency  . Multifocal pneumonia 11/14/2017 . Osteoporosis 11/2013  DEXA T score -2.7 . Prediabetes  . Schatzki's ring 02/2012  s/p dilation [name] . Sleep apnea   no CPAP- sleep apnea "cleared up 15 years ago" . Stroke ([place])  . Urge incontinence  Past Surgical History: Past Surgical History: Procedure Laterality Date . APPENDECTOMY  1996 . CARDIAC CATHETERIZATION  2000  30% one vessel . CARDIOVASCULAR STRESS TEST  03/2015  no ischemia, low risk, EF 76% . CAROTID U/S  12/28/2007  nml . CATARACT EXTRACTION  09/2009  bilateral . COLONOSCOPY  [place]  [place] . COLONOSCOPY  07/2010  5 polyps, adenomatous, rec rpt 3 yrs . COLONOSCOPY  04/2014  3 polyps, adenomatous, f/u open ended given age [name] . CT ABD W & PELVIS WO CM  07/2001  Scarring of right lung, stable negative o/w . CT ABD W & PELVIS WO CM  11/2000  ? stones, right LL scarring . CYSTOSCOPY  1992  for kidney stones . EEG  11/12/2009  nml . ESOPHAGOGASTRODUODENOSCOPY  02/2012  dilation of schatzki's ring [name] . ESOPHAGOGASTRODUODENOSCOPY N/A 11/15/2015  Procedure: ESOPHAGOGASTRODUODENOSCOPY (EGD);  Surgeon: Gatha Mayer, MD;  Location[facility] ENDOSCOPY;  Service: Endoscopy;  Laterality: N/A; . ESOPHAGOGASTRODUODENOSCOPY  11/2016  esophageal erosions, GERD, esophageal stenosis dilated [name] . ESOPHAGOGASTRODUODENOSCOPY (EGD) WITH PROPOFOL N/A 10/29/2017  dilated esophageal stenosis - Milus Banister, MD . [place]  with incidental appendectomy . HERNIA REPAIR  1979  Left . HIP PINNING    right hip . HIP PINNING,CANNULATED Left 03/11/2016  Procedure: CANNULATED HIP PINNING;  Surgeon: Rod Can, MD;  Location: [facility];  Service: Orthopedics;  Laterality: Left; . KNEE ARTHROSCOPY  03/24/02  Right (Dr. Mauri Pole) . MRI  11/2009  Head, nml . ORIF FEMORAL NECK FRACTURE W/ DHS Right 08/03/2002  Dr Mauri Pole .  TONSILLECTOMY  1965 . US ECHOCARDIOGRAPHY  12/28/2007  Mild aortic valve calcification EF 55%, basically nml . V/Q SCAN  06/1999  negative HPI: AVIV LENGACHER is a 84 y.o. male with medical history significant of CAD, stage 3 CKD, dementia, hyperlipidemia, chronic anemia,  hx CVA, remote hx extensive PE and DVT 1992, 2017 on chronic coumadin, chronic cystitis followed by Dr Roni Bread, GERD, hx dysphagia with esophageal stricture last dilitation 10/2017, benign tremor for years currently undergoing w/u at [facility] for [place] presents with chest pain and shortness of breath. Symptoms awoke pt from sleep with sob and midsternal, nonradiating chest pain with no associated diaphoresis or palpitations.  Pain unchanged with pepto bismol wife gave him at 0400. Pain relieved with ASA and O2 in ambulance. He is pain free now. Pt and wife deny recent fever, headache, dizziness, abdominal pain, n/v/d or LE swelling. Wife does report frequent coughing while sleeping and sometime while eating. CXR shows multifocal pna bilaterally. Prominent cricopharyngeus muscle. 2. Moderate distal esophageal smooth stricture over approximately 3.2 cm segment .  No data recorded Assessment / Plan / Recommendation CHL IP CLINICAL IMPRESSIONS 12/03/2017 Clinical Impression Pt demonstrates signs of a primary, apparently severe esophageal dysphagia that is likely the principal risk factor for an aspiration pnemonia. There is also a mild to moderate oropharyngeal dysphagia due to structural deficits. There is an appearance of bony protrusions on the cervical esophagus that impede epiglottic deflection, vestibular closure and UES opening. This results in consistent high or frank penetration of thin liquids with adequate glottic closure. One instance of liquids squeezing past the cords was immediately sensed by pt with a forceful throat clear. Pt clears throat quite frequently, but a cues for a HARDER throat clear fully ejects penetrates and a second swallow  clears pharyngeal residuals. No strategies eliminated or reduced penetration or residuals. Esophageal sweep shows severe stasis without any observable peristalsis, no radiologist present to confirm. Recommend pt continue a regular diet and thin liquids with SLP f/u to encourage apiration prevention strategies and therapeutic interventions to maintain pts expiratory strength (RMST). F/u with GI may be warranted.  SLP Visit Diagnosis Dysphagia, oropharyngeal phase (R13.12);Dysphagia, pharyngoesophageal phase (R13.14) Attention and concentration deficit following -- Frontal lobe and executive function deficit following -- Impact on safety and function Severe aspiration risk   CHL IP TREATMENT RECOMMENDATION 12/03/2017 Treatment Recommendations Therapy as outlined in treatment plan below   Prognosis 12/03/2017 Prognosis for Safe Diet Advancement Good Barriers to Reach Goals -- Barriers/Prognosis Comment -- CHL IP DIET RECOMMENDATION 12/03/2017 SLP Diet Recommendations Regular solids;Thin liquid Liquid Administration via Straw Medication Administration Whole meds with puree Compensations Slow rate;Small sips/bites;Clear throat after each swallow;Follow solids with liquid;Multiple dry swallows after each bite/sip Postural Changes Remain semi-upright after after feeds/meals (Comment);Seated upright at 90 degrees   CHL IP OTHER RECOMMENDATIONS 12/03/2017 Recommended Consults Consider GI evaluation Oral Care Recommendations Oral care BID Other Recommendations --   CHL IP FOLLOW UP RECOMMENDATIONS 12/03/2017 Follow up Recommendations Outpatient SLP   CHL IP FREQUENCY AND DURATION 12/03/2017 Speech Therapy Frequency (ACUTE ONLY) min 2x/week Treatment Duration 2 weeks      CHL IP ORAL PHASE 12/03/2017 Oral Phase WFL Oral - Pudding Teaspoon -- Oral - Pudding Cup -- Oral - Honey Teaspoon -- Oral - Honey Cup -- Oral - Nectar Teaspoon -- Oral - Nectar Cup -- Oral - Nectar Straw -- Oral - Thin Teaspoon -- Oral - Thin Cup -- Oral - Thin  Straw -- Oral - Puree -- Oral - Mech Soft -- Oral - Regular -- Oral - Multi-Consistency -- Oral - Pill -- Oral Phase - Comment --  CHL IP PHARYNGEAL PHASE 12/03/2017 Pharyngeal Phase Impaired Pharyngeal- Pudding Teaspoon -- Pharyngeal -- Pharyngeal- Pudding Cup -- Pharyngeal -- Pharyngeal- Honey Teaspoon -- Pharyngeal --  Pharyngeal- Honey Cup -- Pharyngeal -- Pharyngeal- Nectar Teaspoon -- Pharyngeal -- Pharyngeal- Nectar Cup -- Pharyngeal -- Pharyngeal- Nectar Straw -- Pharyngeal -- Pharyngeal- Thin Teaspoon -- Pharyngeal -- Pharyngeal- Thin Cup -- Pharyngeal -- Pharyngeal- Thin Straw Reduced epiglottic inversion;Reduced airway/laryngeal closure;Penetration/Aspiration during swallow;Trace aspiration;Pharyngeal residue - valleculae;Pharyngeal residue - pyriform;Compensatory strategies attempted (with notebox) Pharyngeal Material enters airway, remains ABOVE vocal cords then ejected out;Material enters airway, remains ABOVE vocal cords and not ejected out;Material enters airway, passes BELOW cords then ejected out Pharyngeal- Puree Reduced epiglottic inversion;Pharyngeal residue - valleculae;Pharyngeal residue - pyriform Pharyngeal -- Pharyngeal- Mechanical Soft -- Pharyngeal -- Pharyngeal- Regular Pharyngeal residue - valleculae;Pharyngeal residue - pyriform Pharyngeal -- Pharyngeal- Multi-consistency -- Pharyngeal -- Pharyngeal- Pill Other (Comment) Pharyngeal -- Pharyngeal Comment --  No flowsheet data found. No flowsheet data found. DeBlois, [name] 12/03/2017, 1:37 PM                   Scheduled Meds: . aspirin EC  81 mg Oral Daily  . docusate sodium  100 mg Oral QHS  . fenofibrate  160 mg Oral Daily  . loratadine  10 mg Oral Daily  . mouth rinse  15 mL Mouth Rinse BID  . memantine  20 mg Oral QHS  . pantoprazole  40 mg Oral Daily  . predniSONE  20 mg Oral Q breakfast  . rivastigmine  6 mg Oral BID  . simvastatin  40 mg Oral q1800  . sodium chloride flush  3 mL Intravenous Q12H  .  warfarin  2.5 mg Oral ONCE-1800  . Warfarin - Pharmacist Dosing Inpatient   Does not apply q1800   Continuous Infusions: . sodium chloride    . ceFEPime (MAXIPIME) IV 2 g (12/03/17 1223)  . metronidazole Stopped (12/03/17 0747)  . vancomycin Stopped (12/03/17 0935)     LOS: 1 day    Time spent: 15 minutes    Velvet Bathe, MD [facility] Pager [phone]  If 7PM-7AM, please contact night-coverage [email or web address] Password [facility] 12/03/2017, 2:02 PM

## 2017-12-04 DIAGNOSIS — J9601 Acute respiratory failure with hypoxia: Secondary | ICD-10-CM

## 2017-12-04 DIAGNOSIS — Z7189 Other specified counseling: Secondary | ICD-10-CM

## 2017-12-04 DIAGNOSIS — Z515 Encounter for palliative care: Secondary | ICD-10-CM

## 2017-12-04 DIAGNOSIS — I5030 Unspecified diastolic (congestive) heart failure: Secondary | ICD-10-CM

## 2017-12-04 LAB — BLOOD GAS, ARTERIAL
Acid-base deficit: 2.3 mmol/L — ABNORMAL HIGH (ref 0.0–2.0)
Acid-base deficit: 6.2 mmol/L — ABNORMAL HIGH (ref 0.0–2.0)
BICARBONATE: 21.8 mmol/L (ref 20.0–28.0)
Bicarbonate: 19.1 mmol/L — ABNORMAL LOW (ref 20.0–28.0)
DRAWN BY: 511471
Delivery systems: POSITIVE
Drawn by: 511471
Expiratory PAP: 6
FIO2: 60
INSPIRATORY PAP: 12
O2 CONTENT: 6 L/min
O2 Saturation: 96.3 %
O2 Saturation: 98.6 %
PCO2 ART: 39.6 mmHg (ref 32.0–48.0)
PH ART: 7.304 — AB (ref 7.350–7.450)
PO2 ART: 137 mmHg — AB (ref 83.0–108.0)
PO2 ART: 97.8 mmHg (ref 83.0–108.0)
Patient temperature: 98.6
Patient temperature: 98.6
pCO2 arterial: 36.4 mmHg (ref 32.0–48.0)
pH, Arterial: 7.394 (ref 7.350–7.450)

## 2017-12-04 LAB — PROTIME-INR
INR: 2.46
PROTHROMBIN TIME: 26.5 s — AB (ref 11.4–15.2)

## 2017-12-04 LAB — PROCALCITONIN: PROCALCITONIN: 0.4 ng/mL

## 2017-12-04 MED ORDER — FUROSEMIDE 10 MG/ML IJ SOLN
20.0000 mg | Freq: Once | INTRAMUSCULAR | Status: AC
  Administered 2017-12-04: 20 mg via INTRAVENOUS
  Filled 2017-12-04: qty 2

## 2017-12-04 MED ORDER — WARFARIN 1.25 MG HALF TABLET
1.2500 mg | ORAL_TABLET | Freq: Once | ORAL | Status: AC
  Administered 2017-12-04: 1.25 mg via ORAL
  Filled 2017-12-04 (×2): qty 1

## 2017-12-04 MED ORDER — MORPHINE SULFATE (PF) 2 MG/ML IV SOLN
1.0000 mg | INTRAVENOUS | Status: DC | PRN
  Administered 2017-12-04: 2 mg via INTRAVENOUS
  Filled 2017-12-04: qty 1

## 2017-12-04 MED ORDER — SODIUM CHLORIDE 0.9 % IV SOLN
1.0000 g | INTRAVENOUS | Status: DC
  Administered 2017-12-05 – 2017-12-07 (×3): 1 g via INTRAVENOUS
  Filled 2017-12-04 (×4): qty 1

## 2017-12-04 MED ORDER — VANCOMYCIN HCL IN DEXTROSE 750-5 MG/150ML-% IV SOLN
750.0000 mg | INTRAVENOUS | Status: DC
  Administered 2017-12-05: 750 mg via INTRAVENOUS
  Filled 2017-12-04: qty 150

## 2017-12-04 MED ORDER — MORPHINE SULFATE (PF) 2 MG/ML IV SOLN
1.0000 mg | INTRAVENOUS | Status: DC | PRN
  Administered 2017-12-04: 3 mg via INTRAVENOUS
  Filled 2017-12-04: qty 2

## 2017-12-04 MED ORDER — MORPHINE SULFATE (PF) 2 MG/ML IV SOLN
1.0000 mg | INTRAVENOUS | Status: DC | PRN
  Administered 2017-12-04 – 2017-12-05 (×5): 2 mg via INTRAVENOUS
  Filled 2017-12-04 (×5): qty 1

## 2017-12-04 NOTE — Progress Notes (Signed)
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 Dx with pna on work up.  Assessment & Plan:   Principal Problem:   Acute respiratory failure with hypoxia (HCC) - 2ary to pna - continue supportive therapy with supplemental oxygen.     Elevated troponin - per cardiology. No aggressive work up planned. Pt had deterioration in condition and plan is for comfort care. Palliative team consulted.  - increased morphine given reports of chest discomfort.   Active Problems:   CKD (chronic kidney disease) stage 3, GFR 30-59 ml/min (HCC)   Essential tremor   HLD (hyperlipidemia)   GERD (gastroesophageal reflux disease)   Dementia   Macrocytic anemia   (HFpEF) heart failure with preserved ejection fraction (HCC)   Elevated factor VIII level   Long term (current) use of anticoagulants   CAP (community acquired pneumonia)   DVT prophylaxis: Warfarin Code Status: Partial: intubation but no chest compressions Family Communication: d/c family today Disposition Plan: consulted palliative for symptom management and goc   Consultants:   cardiology   Procedures: none   Antimicrobials: cefepime, flagyl, vancomycin   Subjective: No new complaints reported.   Objective: Vitals:   12/04/17 0741 12/04/17 1221 12/04/17 1320 12/04/17 1604  BP: 132/68 122/60  114/63  Pulse: 88     Resp: (!) 26  (!) 21   Temp: 98.7 F (37.1 C) 98 F (36.7 C)  98.4 F (36.9 C)  TempSrc: Rectal Axillary  Axillary  SpO2: 99%     Weight:      Height:        Intake/Output Summary (Last 24 hours) at 12/04/2017 1659 Last data filed at 12/04/2017 1220 Gross per 24 hour  Intake 953 ml  Output 450 ml  Net 503 ml   Filed Weights   11/09/2017 0709 11/29/2017 1800  Weight: 78 kg (172 lb) 78.8 kg (173 lb  11.6 oz)    Examination:  General exam: Appears calm and comfortable, in nad.  Respiratory system: rhales, no wheezes, equal chest rise.  Cardiovascular system: S1 & S2 heard, RRR.  Gastrointestinal system: Abdomen is nondistended, soft and nontender.  Central nervous system: Alert and oriented. No focal neurological deficits. Extremities: warm and dry. Skin: No rashes, lesions or ulcers, on limited exam. Psychiatry:  Mood & affect appropriate.    Data Reviewed: I have personally reviewed following labs and imaging studies  CBC: Recent Labs  Lab 11/29/2017 0801 11/21/2017 0807 12/03/17 0522  WBC 10.8*  --  8.4  HGB 10.8* 10.5* 10.1*  HCT 33.7* 31.0* 31.5*  MCV 119.9*  --  120.7*  PLT 138*  --  324*   Basic Metabolic Panel: Recent Labs  Lab 11/19/2017 0801 11/30/2017 0807 12/03/17 0522 12/03/17 1405  NA 140 142 139  --   K 4.8 4.8 5.3* 4.7  CL 107 107 108  --   CO2 23  --  23  --   GLUCOSE 142* 137* 190*  --   BUN 26* 26* 31*  --   CREATININE 1.60* 1.60* 1.94*  --   CALCIUM 9.2  --  8.6*  --    GFR: Estimated Creatinine Clearance: 28 mL/min (A) (by C-G formula based on SCr of 1.94 mg/dL (H)). Liver Function Tests: Recent Labs  Lab 12/03/17 0003  AST 47*  ALT 27  ALKPHOS 42  BILITOT 2.1*  PROT 5.6*  ALBUMIN 2.9*   No results for input(s): LIPASE, AMYLASE in the last 168 hours. No results for input(s): AMMONIA in the last 168 hours. Coagulation Profile: Recent Labs  Lab 12/01/17 11/09/2017 0801 12/03/17 0522 12/04/17 0242  INR 2.3 2.11 2.15 2.46   Cardiac Enzymes: Recent Labs  Lab 11/27/2017 1444 11/09/2017 1830 12/03/17 0003  TROPONINI 1.12* 1.37* 2.25*   BNP (last 3 results) No results for input(s): PROBNP in the last 8760 hours. HbA1C: No results for input(s): HGBA1C in the last 72 hours. CBG: No results for input(s): GLUCAP in the last 168 hours. Lipid Profile: Recent Labs    12/03/17 0522  CHOL 130  HDL 15*  LDLCALC 69  TRIG 231*  CHOLHDL  8.7   Thyroid Function Tests: No results for input(s): TSH, T4TOTAL, FREET4, T3FREE, THYROIDAB in the last 72 hours. Anemia Panel: No results for input(s): VITAMINB12, FOLATE, FERRITIN, TIBC, IRON, RETICCTPCT in the last 72 hours. Sepsis Labs: Recent Labs  Lab 11/27/2017 1444 12/03/2017 1830 12/03/17 0522 12/04/17 0242  PROCALCITON 0.58  --  0.78 0.40  LATICACIDVEN 1.7 1.8  --   --     Recent Results (from the past 240 hour(s))  Culture, blood (routine x 2) Call MD if unable to obtain prior to antibiotics being given     Status: None (Preliminary result)   Collection Time: 11/13/2017  2:44 PM  Result Value Ref Range Status   Specimen Description BLOOD LEFT HAND  Final   Special Requests   Final    BOTTLES DRAWN AEROBIC ONLY Blood Culture results may not be optimal due to an inadequate volume of blood received in culture bottles   Culture   Final    NO GROWTH 2 DAYS Performed at [facility], [place] [address], [place], [place] [address]    Report Status PENDING  Incomplete  Culture, blood (routine x 2) Call MD if unable to obtain prior to antibiotics being given     Status: None (Preliminary result)   Collection Time: 11/09/2017  3:09 PM  Result Value Ref Range Status   Specimen Description BLOOD RIGHT HAND  Final   Special Requests   Final    BOTTLES DRAWN AEROBIC AND ANAEROBIC Blood Culture adequate volume   Culture   Final    NO GROWTH 2 DAYS Performed at [facility], [place] [address] [place], [place] [address]    Report Status PENDING  Incomplete         Radiology Studies: Dg Swallowing Func-speech Pathology  Result Date: 12/03/2017 Objective Swallowing Evaluation: Type of Study: MBS-Modified Barium Swallow Study  Patient Details Name: [name] MRN: [id number] Date of Birth: 1931/05/09 Today's Date: 12/03/2017 Time: SLP Start Time (ACUTE ONLY): 0945 -SLP Stop Time (ACUTE ONLY): 1015 SLP Time Calculation (min) (ACUTE ONLY): 30 min Past Medical History: Past  Medical History: Diagnosis Date . Allergic rhinitis  . Arthritis  . Blood transfusion 1990's . CAD (coronary artery disease)   cath 2000 30% single vessel, normal nuclear stress test 12/03/2010, no evidence ischemia . Candidal urethritis in male 06/15/2015 . CKD (chronic kidney disease) stage 3, GFR 30-59 ml/min (HCC)   baseline Cr 1.7 . Closed C7 fracture ([place]) 11/06/2014 . DDD (degenerative disc disease) 03/2013  by CT, diffuse multilevel cervical and lumbar spondylosis . Dementia  . Depression  . DISH (diffuse idiopathic skeletal hyperostosis) 03/2013  lumbar spine on xray . Dizziness  multifactorial, s/p PT at [facility] 06/2014 with HEP . DVT (deep venous thrombosis) ([place]) 07/2016 . Dysphagia  . Elevated PSA   previous-normalized (followed by Dr. Jeffie Pollock, rec no repeat unless urinary sxs) . Epididymoorchitis 08/21/2017 . Esophageal stenosis  . Essential tremor 09/27/2008  reviewed eval by Dr Jannifer Franklin in chart 2011  . Falls  . Gait abnormality 03/16/2017 . GERD (gastroesophageal reflux disease)   h/o PUD . Hearing loss 06/2012  eval - rec annual exam . History of fracture of right hip  . History of phlebitis  . History of pyelonephritis 05/2014  hospitalization with sepsis . HLD (hyperlipidemia)   hypertriglyceridemia . HSV 03/08/2007  Qualifier: Diagnosis of  By: Council Mechanic MD, Hilaria Ota  . Hx pulmonary embolism 08/1991  negative hypercoagulable panel 12/2014 . Inguinal hernia  . Macrocytic anemia 2014  stable B12/folate and periph smear 05/2013, again periph smear 2017 with mature cells, mild dacrocytosis . Mild aortic insufficiency  . Multifocal pneumonia 11/25/2017 . Osteoporosis 11/2013  DEXA T score -2.7 . Prediabetes  . Schatzki's ring 02/2012  s/p dilation [name] . Sleep apnea   no CPAP- sleep apnea "cleared up 15 years ago" . Stroke ([place])  . Urge incontinence  Past Surgical History: Past Surgical History: Procedure Laterality Date . APPENDECTOMY  1996 . CARDIAC CATHETERIZATION  2000  30% one vessel . CARDIOVASCULAR STRESS  TEST  03/2015  no ischemia, low risk, EF 76% . CAROTID U/S  12/28/2007  nml . CATARACT EXTRACTION  09/2009  bilateral . COLONOSCOPY  [place]  [place] . COLONOSCOPY  07/2010  5 polyps, adenomatous, rec rpt 3 yrs . COLONOSCOPY  04/2014  3 polyps, adenomatous, f/u open ended given age [name] . CT ABD W & PELVIS WO CM  07/2001  Scarring of right lung, stable negative o/w . CT ABD W & PELVIS WO CM  11/2000  ? stones, right LL scarring . CYSTOSCOPY  1992  for kidney stones . EEG  11/12/2009  nml . ESOPHAGOGASTRODUODENOSCOPY  02/2012  dilation of schatzki's ring [name] . ESOPHAGOGASTRODUODENOSCOPY N/A 11/15/2015  Procedure: ESOPHAGOGASTRODUODENOSCOPY (EGD);  Surgeon: Gatha Mayer, MD;  Location: [facility] ENDOSCOPY;  Service: Endoscopy;  Laterality: N/A; . ESOPHAGOGASTRODUODENOSCOPY  11/2016  esophageal erosions, GERD, esophageal stenosis dilated [name] . ESOPHAGOGASTRODUODENOSCOPY (EGD) WITH PROPOFOL N/A 10/29/2017  dilated esophageal stenosis - Milus Banister, MD . [place]  with incidental appendectomy . HERNIA REPAIR  1979  Left . HIP PINNING    right hip . HIP PINNING,CANNULATED Left 03/11/2016  Procedure: CANNULATED HIP PINNING;  Surgeon: Rod Can, MD;  Location: [place];  Service: Orthopedics;  Laterality: Left; . KNEE ARTHROSCOPY  03/24/02  Right (Dr. Mauri Pole) . MRI  11/2009  Head, nml . ORIF FEMORAL NECK FRACTURE W/ DHS Right 08/03/2002  Dr Mauri Pole . TONSILLECTOMY  1965 . US ECHOCARDIOGRAPHY  12/28/2007  Mild aortic valve calcification EF 55%, basically nml . V/Q SCAN  06/1999  negative HPI: [name] is a 84 y.o. male with medical history significant of CAD, stage 3 CKD, dementia, hyperlipidemia, chronic anemia, hx CVA, remote hx extensive PE and DVT 1992, 2017 on chronic coumadin, chronic cystitis followed by Dr Roni Bread, GERD, hx dysphagia with esophageal stricture last dilitation 10/2017, benign tremor for years currently undergoing w/u at [facility] for [place] presents with chest pain and shortness  of breath. Symptoms awoke pt from sleep with sob and midsternal, nonradiating chest pain with no associated diaphoresis or palpitations.  Pain unchanged with pepto bismol wife gave him at  0400. Pain relieved with ASA and O2 in ambulance. He is pain free now. Pt and wife deny recent fever, headache, dizziness, abdominal pain, n/v/d or LE swelling. Wife does report frequent coughing while sleeping and sometime while eating. CXR shows multifocal pna bilaterally. Prominent cricopharyngeus muscle. 2. Moderate distal esophageal smooth stricture over approximately 3.2 cm segment .  No data recorded Assessment / Plan / Recommendation CHL IP CLINICAL IMPRESSIONS 12/03/2017 Clinical Impression Pt demonstrates signs of a primary, apparently severe esophageal dysphagia that is likely the principal risk factor for an aspiration pnemonia. There is also a mild to moderate oropharyngeal dysphagia due to structural deficits. There is an appearance of bony protrusions on the cervical esophagus that impede epiglottic deflection, vestibular closure and UES opening. This results in consistent high or frank penetration of thin liquids with adequate glottic closure. One instance of liquids squeezing past the cords was immediately sensed by pt with a forceful throat clear. Pt clears throat quite frequently, but a cues for a HARDER throat clear fully ejects penetrates and a second swallow clears pharyngeal residuals. No strategies eliminated or reduced penetration or residuals. Esophageal sweep shows severe stasis without any observable peristalsis, no radiologist present to confirm. Recommend pt continue a regular diet and thin liquids with SLP f/u to encourage apiration prevention strategies and therapeutic interventions to maintain pts expiratory strength (RMST). F/u with GI may be warranted.  SLP Visit Diagnosis Dysphagia, oropharyngeal phase (R13.12);Dysphagia, pharyngoesophageal phase (R13.14) Attention and concentration deficit  following -- Frontal lobe and executive function deficit following -- Impact on safety and function Severe aspiration risk   CHL IP TREATMENT RECOMMENDATION 12/03/2017 Treatment Recommendations Therapy as outlined in treatment plan below   Prognosis 12/03/2017 Prognosis for Safe Diet Advancement Good Barriers to Reach Goals -- Barriers/Prognosis Comment -- CHL IP DIET RECOMMENDATION 12/03/2017 SLP Diet Recommendations Regular solids;Thin liquid Liquid Administration via Straw Medication Administration Whole meds with puree Compensations Slow rate;Small sips/bites;Clear throat after each swallow;Follow solids with liquid;Multiple dry swallows after each bite/sip Postural Changes Remain semi-upright after after feeds/meals (Comment);Seated upright at 90 degrees   CHL IP OTHER RECOMMENDATIONS 12/03/2017 Recommended Consults Consider GI evaluation Oral Care Recommendations Oral care BID Other Recommendations --   CHL IP FOLLOW UP RECOMMENDATIONS 12/03/2017 Follow up Recommendations Outpatient SLP   CHL IP FREQUENCY AND DURATION 12/03/2017 Speech Therapy Frequency (ACUTE ONLY) min 2x/week Treatment Duration 2 weeks      CHL IP ORAL PHASE 12/03/2017 Oral Phase WFL Oral - Pudding Teaspoon -- Oral - Pudding Cup -- Oral - Honey Teaspoon -- Oral - Honey Cup -- Oral - Nectar Teaspoon -- Oral - Nectar Cup -- Oral - Nectar Straw -- Oral - Thin Teaspoon -- Oral - Thin Cup -- Oral - Thin Straw -- Oral - Puree -- Oral - Mech Soft -- Oral - Regular -- Oral - Multi-Consistency -- Oral - Pill -- Oral Phase - Comment --  CHL IP PHARYNGEAL PHASE 12/03/2017 Pharyngeal Phase Impaired Pharyngeal- Pudding Teaspoon -- Pharyngeal -- Pharyngeal- Pudding Cup -- Pharyngeal -- Pharyngeal- Honey Teaspoon -- Pharyngeal -- Pharyngeal- Honey Cup -- Pharyngeal -- Pharyngeal- Nectar Teaspoon -- Pharyngeal -- Pharyngeal- Nectar Cup -- Pharyngeal -- Pharyngeal- Nectar Straw -- Pharyngeal -- Pharyngeal- Thin Teaspoon -- Pharyngeal -- Pharyngeal- Thin Cup --  Pharyngeal -- Pharyngeal- Thin Straw Reduced epiglottic inversion;Reduced airway/laryngeal closure;Penetration/Aspiration during swallow;Trace aspiration;Pharyngeal residue - valleculae;Pharyngeal residue - pyriform;Compensatory strategies attempted (with notebox) Pharyngeal Material enters airway, remains ABOVE vocal cords then ejected out;Material enters airway, remains ABOVE vocal cords  and not ejected out;Material enters airway, passes BELOW cords then ejected out Pharyngeal- Puree Reduced epiglottic inversion;Pharyngeal residue - valleculae;Pharyngeal residue - pyriform Pharyngeal -- Pharyngeal- Mechanical Soft -- Pharyngeal -- Pharyngeal- Regular Pharyngeal residue - valleculae;Pharyngeal residue - pyriform Pharyngeal -- Pharyngeal- Multi-consistency -- Pharyngeal -- Pharyngeal- Pill Other (Comment) Pharyngeal -- Pharyngeal Comment --  No flowsheet data found. No flowsheet data found. DeBlois, [name] 12/03/2017, 1:37 PM                   Scheduled Meds: . aspirin EC  81 mg Oral Daily  . docusate sodium  100 mg Oral QHS  . fenofibrate  160 mg Oral Daily  . loratadine  10 mg Oral Daily  . mouth rinse  15 mL Mouth Rinse BID  . memantine  20 mg Oral QHS  . pantoprazole  40 mg Oral Daily  . rivastigmine  6 mg Oral BID  . simvastatin  40 mg Oral q1800  . sodium chloride flush  3 mL Intravenous Q12H  . warfarin  1.25 mg Oral ONCE-1800  . Warfarin - Pharmacist Dosing Inpatient   Does not apply q1800   Continuous Infusions: . sodium chloride    . [START ON 12/05/2017] ceFEPime (MAXIPIME) IV    . [START ON 12/05/2017] vancomycin       LOS: 2 days    Time spent: 15 minutes    Velvet Bathe, MD [facility] Pager [phone]  If 7PM-7AM, please contact night-coverage [email or web address] Password [facility] 12/04/2017, 4:59 PM

## 2017-12-04 NOTE — Progress Notes (Signed)
ANTICOAGULATION CONSULT NOTE - Follow-Up Consult  Pharmacy Consult for Warfarin Indication: hx PE/DVT   Patient Measurements: Height: 5\' 6"  (167.6 cm) Weight: 173 lb 11.6 oz (78.8 kg) IBW/kg (Calculated) : 63.8  Vital Signs: Temp: 98.7 F (37.1 C) (03/29 0741) Temp Source: Rectal (03/29 0741) BP: 132/68 (03/29 0741) Pulse Rate: 88 (03/29 0741)  Labs: Recent Labs    12/06/2017 0801 12/04/2017 0807 11/11/2017 1444 11/16/2017 1830 12/03/17 0003 12/03/17 0522 12/04/17 0242  HGB 10.8* 10.5*  --   --   --  10.1*  --   HCT 33.7* 31.0*  --   --   --  31.5*  --   PLT 138*  --   --   --   --  145*  --   LABPROT 23.5*  --   --   --   --  23.8* 26.5*  INR 2.11  --   --   --   --  2.15 2.46  CREATININE 1.60* 1.60*  --   --   --  1.94*  --   TROPONINI  --   --  1.12* 1.37* 2.25*  --   --     Estimated Creatinine Clearance: 28 mL/min (A) (by C-G formula based on SCr of 1.94 mg/dL (H)).   Assessment: 88 YOM with on warfarin at home for remote hx of extensive PE/DVT presents with chest pain and shortness of breath. Pharmacy consulted to resume warfarin for anticoagulation.   INR this morning remains therapeutic (INR 2.46 << 2.15, goal of 2-3). No CBC today. No overt s/sx of bleeding noted. Flagyl started on 3/27 which is known to increase warfarin sensitivity and also noted to have poor po intake. Will reduce the dose today due to these concerns and trend INR.  Goal of Therapy:  INR 2-3 Monitor platelets by anticoagulation protocol: Yes   Plan:  - Warfarin 1.25 mg x 1 dose at 1800 today - Will continue to monitor for any signs/symptoms of bleeding and will follow up with PT/INR in the a.m.  Thank you for allowing pharmacy to be a part of this patient's care.  Alycia Rossetti, PharmD, BCPS Clinical Pharmacist Pager: [phone] Clinical phone for 12/04/2017 from 7a-3:30p: [phone] If after 3:30p, please call main pharmacy at: [phone] 12/04/2017 9:55 AM

## 2017-12-04 NOTE — Care Management Note (Signed)
Case Management Note  Patient Details  Name: [name] MRN: [id number] Date of Birth: Dec 15, 1930  Subjective/Objective:    Pt admitted with acute respiratory failure and hypoxia. She is from home with her spouse.                 Action/Plan: Palliative consulted for EOLC. CM following for d/c needs, disposition.   Expected Discharge Date:                  Expected Discharge Plan:     In-House Referral:  Clinical Social Work  Discharge planning Services  CM Consult  Post Acute Care Choice:    Choice offered to:     DME Arranged:    DME Agency:     HH Arranged:    HH Agency:     Status of Service:  In process, will continue to follow  If discussed at Long Length of Stay Meetings, dates discussed:    Additional Comments:  Pollie Friar, RN 12/04/2017, 10:28 AM

## 2017-12-04 NOTE — Progress Notes (Signed)
Subjective:  More dyspnea over night on bipap. Discussed code status with wife and patient She indicated that they would like to be no code. Daughter from [place] should be Here today   Objective:  Vitals:   12/03/17 2300 12/04/17 0040 12/04/17 0308 12/04/17 0741  BP: (!) 115/59   132/68  Pulse: (!) 102 (!) 109 93 88  Resp: (!) 32 (!) 31 20 (!) 26  Temp: 98 F (36.7 C)   98.7 F (37.1 C)  TempSrc: Oral   Rectal  SpO2: 94% 98% 100% 99%  Weight:      Height:        Intake/Output from previous day:  Intake/Output Summary (Last 24 hours) at 12/04/2017 1610 Last data filed at 12/04/2017 0251 Gross per 24 hour  Intake 1553 ml  Output 301 ml  Net 1252 ml    Physical Exam: Affect appropriate Elderly white male  HEENT: normal Neck supple with no adenopathy JVP normal no bruits no thyromegaly Lungs clear with no wheezing and good diaphragmatic motion Heart:  S1/S2 no murmur, no rub, gallop or click PMI normal Abdomen: benighn, BS positve, no tenderness, no AAA no bruit.  No HSM or HJR Distal pulses intact with no bruits No edema Neuro non-focal Parkinsons  Skin warm and dry No muscular weakness   Lab Results: Basic Metabolic Panel: Recent Labs    11/25/2017 0801 12/01/2017 0807 12/03/17 0522 12/03/17 1405  NA 140 142 139  --   K 4.8 4.8 5.3* 4.7  CL 107 107 108  --   CO2 23  --  23  --   GLUCOSE 142* 137* 190*  --   BUN 26* 26* 31*  --   CREATININE 1.60* 1.60* 1.94*  --   CALCIUM 9.2  --  8.6*  --    Liver Function Tests: Recent Labs    12/03/17 0003  AST 47*  ALT 27  ALKPHOS 42  BILITOT 2.1*  PROT 5.6*  ALBUMIN 2.9*   No results for input(s): LIPASE, AMYLASE in the last 72 hours. CBC: Recent Labs    11/19/2017 0801 12/05/2017 0807 12/03/17 0522  WBC 10.8*  --  8.4  HGB 10.8* 10.5* 10.1*  HCT 33.7* 31.0* 31.5*  MCV 119.9*  --  120.7*  PLT 138*  --  145*   Cardiac Enzymes: Recent Labs    11/25/2017 1444 11/10/2017 1830 12/03/17 0003    TROPONINI 1.12* 1.37* 2.25*   BNP: Invalid input(s): POCBNP D-Dimer: Recent Labs    11/11/2017 1444  DDIMER <0.27   Hemoglobin A1C: No results for input(s): HGBA1C in the last 72 hours. Fasting Lipid Panel: Recent Labs    12/03/17 0522  CHOL 130  HDL 15*  LDLCALC 69  TRIG 231*  CHOLHDL 8.7    Imaging: Dg Swallowing Func-speech Pathology  Result Date: 12/03/2017 Objective Swallowing Evaluation: Type of Study: MBS-Modified Barium Swallow Study  Patient Details Name: [name] MRN: [id number] Date of Birth: 1931-05-08 Today's Date: 12/03/2017 Time: SLP Start Time (ACUTE ONLY): 1191 -SLP Stop Time (ACUTE ONLY): 1015 SLP Time Calculation (min) (ACUTE ONLY): 30 min Past Medical History: Past Medical History: Diagnosis Date . Allergic rhinitis  . Arthritis  . Blood transfusion 1990's . CAD (coronary artery disease)   cath 2000 30% single vessel, normal nuclear stress test 12/03/2010, no evidence ischemia . Candidal urethritis in male 06/15/2015 . CKD (chronic kidney disease) stage 3, GFR 30-59 ml/min (HCC)   baseline Cr 1.7 . Closed C7 fracture (  [place]) 11/06/2014 . DDD (degenerative disc disease) 03/2013  by CT, diffuse multilevel cervical and lumbar spondylosis . Dementia  . Depression  . DISH (diffuse idiopathic skeletal hyperostosis) 03/2013  lumbar spine on xray . Dizziness   multifactorial, s/p PT at [facility] 06/2014 with HEP . DVT (deep venous thrombosis) ([place]) 07/2016 . Dysphagia  . Elevated PSA   previous-normalized (followed by Dr. Jeffie Pollock, rec no repeat unless urinary sxs) . Epididymoorchitis 08/21/2017 . Esophageal stenosis  . Essential tremor 09/27/2008  reviewed eval by Dr Jannifer Franklin in chart 2011  . Falls  . Gait abnormality 03/16/2017 . GERD (gastroesophageal reflux disease)   h/o PUD . Hearing loss 06/2012  eval - rec annual exam . History of fracture of right hip  . History of phlebitis  . History of pyelonephritis 05/2014  hospitalization with sepsis . HLD (hyperlipidemia)   hypertriglyceridemia  . HSV 03/08/2007  Qualifier: Diagnosis of  By: Council Mechanic MD, Hilaria Ota  . Hx pulmonary embolism 08/1991  negative hypercoagulable panel 12/2014 . Inguinal hernia  . Macrocytic anemia 2014  stable B12/folate and periph smear 05/2013, again periph smear 2017 with mature cells, mild dacrocytosis . Mild aortic insufficiency  . Multifocal pneumonia 11/06/2017 . Osteoporosis 11/2013  DEXA T score -2.7 . Prediabetes  . Schatzki's ring 02/2012  s/p dilation [name] . Sleep apnea   no CPAP- sleep apnea "cleared up 15 years ago" . Stroke ([place])  . Urge incontinence  Past Surgical History: Past Surgical History: Procedure Laterality Date . APPENDECTOMY  1996 . CARDIAC CATHETERIZATION  2000  30% one vessel . CARDIOVASCULAR STRESS TEST  03/2015  no ischemia, low risk, EF 76% . CAROTID U/S  12/28/2007  nml . CATARACT EXTRACTION  09/2009  bilateral . COLONOSCOPY  [place]  [place] . COLONOSCOPY  07/2010  5 polyps, adenomatous, rec rpt 3 yrs . COLONOSCOPY  04/2014  3 polyps, adenomatous, f/u open ended given age [name] . CT ABD W & PELVIS WO CM  07/2001  Scarring of right lung, stable negative o/w . CT ABD W & PELVIS WO CM  11/2000  ? stones, right LL scarring . CYSTOSCOPY  1992  for kidney stones . EEG  11/12/2009  nml . ESOPHAGOGASTRODUODENOSCOPY  02/2012  dilation of schatzki's ring [name] . ESOPHAGOGASTRODUODENOSCOPY N/A 11/15/2015  Procedure: ESOPHAGOGASTRODUODENOSCOPY (EGD);  Surgeon: Gatha Mayer, MD;  [facility] END[facility]COPY;  Service: Endoscopy;  Laterality: N/A; . ESOPHAGOGASTRODUODENOSCOPY  11/2016  esophageal erosions, GERD, esophageal stenosis dilated [name] . ESOPHAGOGASTRODUODENOSCOPY (EGD) WITH PROPOFOL N/A 10/29/2017  dilated esophageal stenosis - Milus Banister, MD . [place]  with incidental appendectomy . HERNIA REPAIR  1979  Left . HIP PINNING    right hip . HIP PINNING,CANNULATED Left 03/11/2016  Procedure: CANNULATED HIP PINNING;  Surgeon: Rod Can, MD;  Location: [place];  Service:  Orthopedics;  Laterality: Left; . KNEE ARTHROSCOPY  03/24/02  Right (Dr. Mauri Pole) . MRI  11/2009  Head, nml . ORIF FEMORAL NECK FRACTURE W/ DHS Right 08/03/2002  Dr Mauri Pole . TONSILLECTOMY  1965 . US ECHOCARDIOGRAPHY  12/28/2007  Mild aortic valve calcification EF 55%, basically nml . V/Q SCAN  06/1999  negative HPI: DEWEY VIENS is a 84 y.o. male with medical history significant of CAD, stage 3 CKD, dementia, hyperlipidemia, chronic anemia, hx CVA, remote hx extensive PE and DVT 1992, 2017 on chronic coumadin, chronic cystitis followed by Dr Roni Bread, GERD, hx dysphagia with esophageal stricture last dilitation 10/2017, benign tremor for years [facility] presents with chest pain and shortness of breath. Symptoms awoke pt from sleep with sob and midsternal, nonradiating chest pain with no associated diaphoresis or palpitations.  Pain unchanged with pepto bismol wife gave him at 0400. Pain relieved with ASA and O2 in ambulance. He is pain free now. Pt and wife deny recent fever, headache, dizziness, abdominal pain, n/v/d or LE swelling. Wife does report frequent coughing while sleeping and sometime while eating. CXR shows multifocal pna bilaterally. Prominent cricopharyngeus muscle. 2. Moderate distal esophageal smooth stricture over approximately 3.2 cm segment .  No data recorded Assessment / Plan / Recommendation CHL IP CLINICAL IMPRESSIONS 12/03/2017 Clinical Impression Pt demonstrates signs of a primary, apparently severe esophageal dysphagia that is likely the principal risk factor for an aspiration pnemonia. There is also a mild to moderate oropharyngeal dysphagia due to structural deficits. There is an appearance of bony protrusions on the cervical esophagus that impede epiglottic deflection, vestibular closure and UES opening. This results in consistent high or frank penetration of thin liquids with adequate glottic closure. One instance of liquids squeezing past the cords was  immediately sensed by pt with a forceful throat clear. Pt clears throat quite frequently, but a cues for a HARDER throat clear fully ejects penetrates and a second swallow clears pharyngeal residuals. No strategies eliminated or reduced penetration or residuals. Esophageal sweep shows severe stasis without any observable peristalsis, no radiologist present to confirm. Recommend pt continue a regular diet and thin liquids with SLP f/u to encourage apiration prevention strategies and therapeutic interventions to maintain pts expiratory strength (RMST). F/u with GI may be warranted.  SLP Visit Diagnosis Dysphagia, oropharyngeal phase (R13.12);Dysphagia, pharyngoesophageal phase (R13.14) Attention and concentration deficit following -- Frontal lobe and executive function deficit following -- Impact on safety and function Severe aspiration risk   CHL IP TREATMENT RECOMMENDATION 12/03/2017 Treatment Recommendations Therapy as outlined in treatment plan below   Prognosis 12/03/2017 Prognosis for Safe Diet Advancement Good Barriers to Reach Goals -- Barriers/Prognosis Comment -- CHL IP DIET RECOMMENDATION 12/03/2017 SLP Diet Recommendations Regular solids;Thin liquid Liquid Administration via Straw Medication Administration Whole meds with puree Compensations Slow rate;Small sips/bites;Clear throat after each swallow;Follow solids with liquid;Multiple dry swallows after each bite/sip Postural Changes Remain semi-upright after after feeds/meals (Comment);Seated upright at 90 degrees   CHL IP OTHER RECOMMENDATIONS 12/03/2017 Recommended Consults Consider GI evaluation Oral Care Recommendations Oral care BID Other Recommendations --   CHL IP FOLLOW UP RECOMMENDATIONS 12/03/2017 Follow up Recommendations Outpatient SLP   CHL IP FREQUENCY AND DURATION 12/03/2017 Speech Therapy Frequency (ACUTE ONLY) min 2x/week Treatment Duration 2 weeks      CHL IP ORAL PHASE 12/03/2017 Oral Phase WFL Oral - Pudding Teaspoon -- Oral - Pudding Cup --  Oral - Honey Teaspoon -- Oral - Honey Cup -- Oral - Nectar Teaspoon -- Oral - Nectar Cup -- Oral - Nectar Straw -- Oral - Thin Teaspoon -- Oral - Thin Cup -- Oral - Thin Straw -- Oral - Puree -- Oral - Mech Soft -- Oral - Regular -- Oral - Multi-Consistency -- Oral - Pill -- Oral Phase - Comment --  CHL IP PHARYNGEAL PHASE 12/03/2017 Pharyngeal Phase Impaired Pharyngeal- Pudding Teaspoon -- Pharyngeal -- Pharyngeal- Pudding Cup -- Pharyngeal -- Pharyngeal- Honey Teaspoon -- Pharyngeal -- Pharyngeal- Honey Cup -- Pharyngeal -- Pharyngeal- Nectar Teaspoon -- Pharyngeal -- Pharyngeal- Nectar Cup -- Pharyngeal -- Pharyngeal- Nectar Straw -- Pharyngeal -- Pharyngeal- Thin Teaspoon -- Pharyngeal -- Pharyngeal- Thin Cup -- Pharyngeal --  Pharyngeal- Thin Straw Reduced epiglottic inversion;Reduced airway/laryngeal closure;Penetration/Aspiration during swallow;Trace aspiration;Pharyngeal residue - valleculae;Pharyngeal residue - pyriform;Compensatory strategies attempted (with notebox) Pharyngeal Material enters airway, remains ABOVE vocal cords then ejected out;Material enters airway, remains ABOVE vocal cords and not ejected out;Material enters airway, passes BELOW cords then ejected out Pharyngeal- Puree Reduced epiglottic inversion;Pharyngeal residue - valleculae;Pharyngeal residue - pyriform Pharyngeal -- Pharyngeal- Mechanical Soft -- Pharyngeal -- Pharyngeal- Regular Pharyngeal residue - valleculae;Pharyngeal residue - pyriform Pharyngeal -- Pharyngeal- Multi-consistency -- Pharyngeal -- Pharyngeal- Pill Other (Comment) Pharyngeal -- Pharyngeal Comment --  No flowsheet data found. No flowsheet data found. DeBlois, [name] 12/03/2017, 1:37 PM               Cardiac Studies:  ECG: SR T inversions I,AVL no acute changes    Telemetry:  NSR no arrhythmia 12/04/2017   Echo:  EF 25-30% inferior RWMA  Medications:   . aspirin EC  81 mg Oral Daily  . docusate sodium  100 mg Oral QHS  . fenofibrate  160 mg Oral  Daily  . loratadine  10 mg Oral Daily  . mouth rinse  15 mL Mouth Rinse BID  . memantine  20 mg Oral QHS  . pantoprazole  40 mg Oral Daily  . predniSONE  20 mg Oral Q breakfast  . rivastigmine  6 mg Oral BID  . simvastatin  40 mg Oral q1800  . sodium chloride flush  3 mL Intravenous Q12H  . Warfarin - Pharmacist Dosing Inpatient   Does not apply q1800     . sodium chloride    . ceFEPime (MAXIPIME) IV Stopped (12/03/17 1253)  . metronidazole Stopped (12/04/17 0251)  . vancomycin Stopped (12/03/17 0935)    Assessment/Plan:  Pneumonia:  Likely aspiration multi lobar poor oxygenation on 3 antibiotics CXR per primary service SEMI:  EF 25-30% will give one dose of lasix to help oxygenation as hypoxia likely combination of  CHF and pneumonia. MSO4 for comfort  Hypercoagulable:  On coumadin INR Rx  Primary service should further discuss comfort measures I have made DNR and written For lasix and MSO4  [name] 12/04/2017, 8:06 AM

## 2017-12-04 NOTE — Progress Notes (Signed)
Pharmacy Antibiotic Note  [name] is a 84 y.o. male admitted on 11/07/2017 with pneumonia.  Pharmacy has been consulted for Vancomycin + Cefepime dosing along with Flagyl per MD.  The patient's SCr was up to 1.94, estimated CrCl~25-30 ml/min. No BMET taken today - since trend is unknown, will reduce doses slightly and recheck BMET in the AM.   Plan: 1. Reduce Cefepime to 1g IV every 24 hours 2. Reduce Vancomycin to 750 mg IV every 24 hours 3. Will continue to follow renal function, culture results, LOT, and antibiotic de-escalation plans   Height: 5\' 6"  (353.6 cm) Weight: 173 lb 11.6 oz (78.8 kg) IBW/kg (Calculated) : 63.8  Temp (24hrs), Avg:98.2 F (36.8 C), Min:97.9 F (36.6 C), Max:98.7 F (37.1 C)  Recent Labs  Lab 12/01/2017 0801 11/24/2017 0807 11/22/2017 1444 11/22/2017 1830 12/03/17 0522  WBC 10.8*  --   --   --  8.4  CREATININE 1.60* 1.60*  --   --  1.94*  LATICACIDVEN  --   --  1.7 1.8  --     Estimated Creatinine Clearance: 28 mL/min (A) (by C-G formula based on SCr of 1.94 mg/dL (H)).    Allergies  Allergen Reactions  . Lactose Intolerance (Gi) Diarrhea  . Sulfa Antibiotics Hives  . Sulfadiazine Hives  . Aricept [Donepezil Hcl] Rash  . Lipitor [Atorvastatin] Rash  . Nizatidine Rash  . Penicillins Rash    Has patient had a PCN reaction causing immediate rash, facial/tongue/throat swelling, SOB or lightheadedness with hypotension: Yes Has patient had a PCN reaction causing severe rash involving mucus membranes or skin necrosis: No Has patient had a PCN reaction that required hospitalization: No Has patient had a PCN reaction occurring within the last 10 years: No If all of the above answers are "NO", then may proceed with Cephalosporin use.     Antimicrobials this admission: Vanc 3/27 >> Cefepime 3/27 >> Flagyl 3/27 >>  Dose adjustments this admission:   Microbiology results: 3/27 BCx >> ngtd 3/27 RCx >>  Thank you for allowing pharmacy to be a  part of this patient's care.  Alycia Rossetti, PharmD, BCPS Clinical Pharmacist Pager: [phone] Clinical phone for 12/04/2017 from 7a-3:30p: [phone] If after 3:30p, please call main pharmacy at: [phone] 12/04/2017 10:00 AM

## 2017-12-04 NOTE — Consult Note (Signed)
Consultation Note Date: 12/04/2017   Patient Name: [name]  DOB: Jul 28, 1931  MRN: [id number]  Age / Sex: 84 y.o., male  PCP: [name] Bush, [name] Referring Physician: Velvet Bathe, [name]  Reason for Consultation: Establishing goals of care, Hospice Evaluation and Psychosocial/spiritual support  HPI/Patient Profile: 84 y.o. male  with past medical history of obstructive sleep apnea (was on BiPAP remotely but with weight loss was able to stop BiPAP), dementia, questionable Parkinson's disease, chronic kidney disease stage III, coronary artery disease, hyperlipidemia, history of CVA admitted on 12/03/2017 with chest pain and shortness of breath.  Per chest x-ray, patient has multifocal pneumonia.  Consult ordered for "end-of-life care", goals of care.   Clinical Assessment and Goals of Care: Met with patient, patient's wife [name] as well as 1 of his daughters [name]; chart reviewed.  Discussed pathophysiology of aspiration in the setting of dementia, and advanced heart disease EF 25-30%.  Explored issues that are often encountered at end of life such as artificial feeding, risks and benefits of a PEG tube, as well as ongoing BiPAP.  Family has set a limit on care of DNR at this point.  Patient's spouse, [name], would be his healthcare proxy.  Patient at this point secondary to underlying clinical condition as well as dementia is unable to process healthcare decisions on his own t  Patient's wife has been able to articulate a steady decline since 2015 with both a combination of worsening dementia as well as prolonged hospitalizations, fall with C7 fracture, fall with  hip fracture as well as CVA.  Both spouse and daughter  verbalized that he seems less alert today than he did yesterday.  Patient is not talking to me; he is wearing BiPAP.  Per family though, after I left the room, he asked  questions regarding what the difference between palliative medicine and hospice was, so he was clearly attending to our conversation in the room.  He told his daughter [name], that he is very tired and did not want to talk to me today    SUMMARY OF RECOMMENDATIONS   Continue with DNR/DNI.  Verified with family Patient is not full comfort care. Family wants to continue to try to treat the treatable over the next 24-48 hours to see if he is able to improve. Introduced the concept of hospice support in the home, and the facility, or if his clinical condition continues to worsen and he is not able to take anything by mouth and is symptomatically short of breath, he may meet residential hospice criteria. Palliative medicine to meet with family again on 12/05/2017 at 8:30 AM Code Status/Advance Care Planning:  DNR    Symptom Management:   Pain: Continue with morphine as needed 1-3 mg, but will alter frequency to every 2 hours as needed.  We also discussed strategies of scheduled pain management as well as continuous infusion.  At this point, wife would like to pursue an as-needed basis so as not to complicate the picture of whether he is becoming  more or less responsive  Dyspnea: Per spouse, as well as articulating that the BiPAP is "making me feel better"; continue for now.  Opioids as needed.  A continuous infusion may enable him to come off of BiPAP but at this point he is not been receiving enough medication to safely transition to continuous infusion without dramatically increasing opioid delivery  Palliative Prophylaxis:   Aspiration, Bowel Regimen, Delirium Protocol, Eye Care, Frequent Pain Assessment, Oral Care and Turn Reposition   Psycho-social/Spiritual:   Desire for further Chaplaincy support:no  Additional Recommendations: Referral to Community Resources   Prognosis:   Unable to determine.  Patient at very high risk for decompensation secondary to aspiration pneumonia, ongoing  aspiration, I the setting of worsening heart disease with EF 15-17%  He certainly meets his hospice in-home benefit criteria.  The next 24-48 hours may clarify whether he is at end-of-life and appropriate for residential hospice  Discharge Planning: To Be Determined      Primary Diagnoses: Present on Admission: . (HFpEF) heart failure with preserved ejection fraction ([place]) . CKD (chronic kidney disease) stage 3, GFR 30-59 ml/min (HCC) . Dementia . Elevated factor VIII level . Essential tremor . GERD (gastroesophageal reflux disease) . HLD (hyperlipidemia) . Macrocytic anemia . Acute respiratory failure ([place])   I have reviewed the medical record, interviewed the patient and family, and examined the patient. The following aspects are pertinent.  Past Medical History:  Diagnosis Date  . Allergic rhinitis   . Arthritis   . Blood transfusion 1990's  . CAD (coronary artery disease)    cath 2000 30% single vessel, normal nuclear stress test 12/03/2010, no evidence ischemia  . Candidal urethritis in male 06/15/2015  . CKD (chronic kidney disease) stage 3, GFR 30-59 ml/min (HCC)    baseline Cr 1.7  . Closed C7 fracture ([place]) 11/06/2014  . DDD (degenerative disc disease) 03/2013   by CT, diffuse multilevel cervical and lumbar spondylosis  . Dementia   . Depression   . DISH (diffuse idiopathic skeletal hyperostosis) 03/2013   lumbar spine on xray  . Dizziness    multifactorial, s/p PT at [facility] 06/2014 with HEP  . DVT (deep venous thrombosis) ([place]) 07/2016  . Dysphagia   . Elevated PSA    previous-normalized (followed by [name]. Jeffie [name], rec no repeat unless urinary sxs)  . Epididymoorchitis 08/21/2017  . Esophageal stenosis   . Essential tremor 09/27/2008   reviewed eval by [name] in chart 2011   . Falls   . Gait abnormality 03/16/2017  . GERD (gastroesophageal reflux disease)    h/o PUD  . Hearing loss 06/2012   eval - rec annual exam  . History of fracture of right hip   . History  of phlebitis   . History of pyelonephritis 05/2014   hospitalization with sepsis  . HLD (hyperlipidemia)    hypertriglyceridemia  . HSV 03/08/2007   Qualifier: Diagnosis of  By: [name], [name]   . Hx pulmonary embolism 08/1991   negative hypercoagulable panel 12/2014  . Inguinal hernia   . Macrocytic anemia 2014   stable B12/folate and periph smear 05/2013, again periph smear 2017 with mature cells, mild dacrocytosis  . Mild aortic insufficiency   . Multifocal pneumonia 11/22/2017  . Osteoporosis 11/2013   DEXA T score -2.7  . Prediabetes   . Schatzki's ring 02/2012   s/p dilation [name]  . Sleep apnea    no CPAP- sleep apnea "cleared up 15 years ago"  . Stroke (  [place])   . Urge incontinence    Social History   Socioeconomic History  . Marital status: Married    Spouse name: Not on file  . Number of children: 2  . Years of education: Not on file  . Highest education level: Not on file  Occupational History  . 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 Financial resource strain: Not on file  . Food insecurity:    Worry: Not on file    Inability: Not on file  . Transportation needs:    Medical: Not on file    Non-medical: Not on file  Tobacco Use  . Smoking status: Never Smoker  . Smokeless tobacco: Never Used  Substance and Sexual Activity  . Alcohol use: No  . Drug use: No  . Sexual activity: Not Currently  Lifestyle  . Physical activity:    Days per week: Not on file    Minutes per session: Not on file  . Stress: Not on file  Relationships  . Social connections:    Talks on phone: Not on file    Gets together: Not on file    Attends religious service: Not on file    Active member of club or organization: Not on file    Attends meetings of clubs or organizations: Not on file    Relationship status: Not on file  Other Topics Concern  . Not on file  Social History Narrative   Married with 2 children   Husband of  Braxten Memmer    Retired: was principal    Activity: walks dog 2-3 times daily about 66mn, frequent stops    Diet: some water, good fruits/vegetables, fish 1x/wk, no sodas.      Advanced directives: HChauncey Readingis wife, KPerrin Smack Does not want prolonged life support if terminal      Patient does not drink caffeine.   Patient is right handed.    Family History  Problem Relation Age of Onset  . Stroke Mother   . Hypertension Mother   . Cancer Brother        prostate with mets  . Blindness Brother        legally  . Diabetes Brother   . Pulmonary embolism Sister        from shoulder operation  . Alcohol abuse Brother   . Colon cancer Neg Hx   . Esophageal cancer Neg Hx   . Rectal cancer Neg Hx   . Stomach cancer Neg Hx    Scheduled Meds: . aspirin EC  81 mg Oral Daily  . docusate sodium  100 mg Oral QHS  . fenofibrate  160 mg Oral Daily  . loratadine  10 mg Oral Daily  . mouth rinse  15 mL Mouth Rinse BID  . memantine  20 mg Oral QHS  . pantoprazole  40 mg Oral Daily  . rivastigmine  6 mg Oral BID  . simvastatin  40 mg Oral q1800  . sodium chloride flush  3 mL Intravenous Q12H  . warfarin  1.25 mg Oral ONCE-1800  . Warfarin - Pharmacist Dosing Inpatient   Does not apply q1800   Continuous Infusions: . sodium chloride    . [START ON 12/05/2017] ceFEPime (MAXIPIME) IV    . [START ON 12/05/2017] vancomycin     PRN Meds:.sodium chloride, acetaminophen **OR** acetaminophen, ipratropium-albuterol, morphine injection, ondansetron **OR** ondansetron (ZOFRAN) IV, polyethylene glycol, sodium chloride flush Medications Prior to Admission:  Prior to Admission medications  Medication Sig Start Date End Date Taking? Authorizing Provider  acetaminophen (TYLENOL) 500 MG tablet Take 1 tablet (500 mg total) by mouth daily. For hip pain, with extra as needed 07/28/17  Yes [name] Bush, [name]  b complex vitamins tablet Take 1 tablet by mouth daily.   Yes Provider, Historical, [name]  Biotin 2500 MCG  CAPS Take 1 capsule by mouth daily.   Yes Provider, Historical, [name]  Cholecalciferol (VITAMIN D) 2000 UNITS CAPS Take 2,000 Units by mouth daily. Reported on 01/30/2016   Yes Provider, Historical, [name]  Coenzyme Q10 (COQ-10) 200 MG CAPS Take 200 mg by mouth daily.    Yes Provider, Historical, [name]  Cyanocobalamin (B-12 PO) Take 10,000 mcg by mouth 3 (three) times a week.    Yes Provider, Historical, [name]  Dextran 70-Hypromellose, PF, (ARTIFICIAL TEARS PF) 0.1-0.3 % SOLN Place 1-2 drops into both eyes 3 (three) times daily as needed (for dry eyes).    Yes Provider, Historical, [name]  docusate sodium (COLACE) 100 MG capsule Take 100 mg by mouth at bedtime.    Yes Provider, Historical, [name]  esomeprazole (NEXIUM) 40 MG capsule Take 40 mg by mouth daily at 12 noon.   Yes Provider, Historical, [name]  fenofibrate (TRICOR) 145 MG tablet TAKE 1 TABLET DAILY 11/30/17  Yes [name] Bush, [name]  fexofenadine (ALLEGRA) 180 MG tablet Take 180 mg by mouth daily.    Yes Provider, Historical, [name]  memantine (NAMENDA) 10 MG tablet Take 20 mg by mouth at bedtime.   Yes Provider, Historical, [name]  Multiple Vitamins-Minerals (MULTIVITAMIN PO) Take 1 tablet by mouth daily.    Yes Provider, Historical, [name]  rivastigmine (EXELON) 3 MG capsule Take 6 mg by mouth 2 (two) times daily.   Yes Provider, Historical, [name]  simvastatin (ZOCOR) 40 MG tablet Take 1 tablet (40 mg total) by mouth daily. 12/09/16  Yes [name] Bush, [name]  venlafaxine [facility]) 75 MG tablet TAKE ONE TABLET TWICE DAILY WITH MEALS 01/06/17  Yes [name] Bush, [name]  warfarin (COUMADIN) 2.5 MG tablet TAKE ONE (1) TABLET EACH DAY 10/19/17  Yes [name] Bush, [name]  albuterol (PROVENTIL HFA;VENTOLIN HFA) 108 (90 Base) MCG/ACT inhaler Inhale 2 puffs into the lungs every 6 (six) hours as needed for wheezing or shortness of breath. 01/10/16   [name] Bush, [name]  clotrimazole (LOTRIMIN) 1 % cream Apply 1 application topically 2 (two) times daily. Patient taking differently: Apply 1  application topically 2 (two) times daily as needed (for rash).  05/02/15   Pleas Koch, NP  DEXILANT 60 MG capsule TAKE 1 CAPSULE DAILY Patient taking differently: Take 60 mg by mouth once a day 02/03/17   Milus Banister, [name]   Allergies  Allergen Reactions  . Lactose Intolerance (Gi) Diarrhea  . Sulfa Antibiotics Hives  . Sulfadiazine Hives  . Aricept [Donepezil Hcl] Rash  . Lipitor [Atorvastatin] Rash  . Nizatidine Rash  . Penicillins Rash    Has patient had a PCN reaction causing immediate rash, facial/tongue/throat swelling, SOB or lightheadedness with hypotension: Yes Has patient had a PCN reaction causing severe rash involving mucus membranes or skin necrosis: No Has patient had a PCN reaction that required hospitalization: No Has patient had a PCN reaction occurring within the last 10 years: No If all of the above answers are "NO", then may proceed with Cephalosporin use.    Review of Systems  Unable to perform ROS: Acuity of condition    Physical Exam  Constitutional: He appears well-developed and well-nourished.  Ill-appearing  elderly man, wearing BiPAP  HENT:  Head: Normocephalic and atraumatic.  Cardiovascular: Normal rate.  Pulmonary/Chest:  Patient is wearing BiPAP  Neurological:  Minimally responsive with this writer   Skin: Skin is warm and dry. There is pallor.  Psychiatric:  Unable to test  Nursing note and vitals reviewed.   Vital Signs: BP 122/60 (BP Location: Left Arm)   Pulse 88   Temp 98 F (36.7 C) (Axillary)   Resp (!) 21   Ht _0  (1.676 m)   Wt 78.8 kg (173 lb 11.6 oz)   SpO2 99%   BMI 28.04 kg/m  Pain Scale: 0-10   Pain Score: 8    SpO2: SpO2: 99 % O2 Device:SpO2: 99 % O2 Flow Rate: .O2 Flow Rate (L/min): 6 L/min  IO: Intake/output summary:   Intake/Output Summary (Last 24 hours) at 12/04/2017 1603 Last data filed at 12/04/2017 1220 Gross per 24 hour  Intake 953 ml  Output 450 ml  Net 503 ml    LBM: Last BM Date:  12/03/17 Baseline Weight: Weight: 78 kg (172 lb) Most recent weight: Weight: 78.8 kg (173 lb 11.6 oz)     Palliative Assessment/Data:   Flowsheet Rows     Most Recent Value  Intake Tab  Referral Department  Hospitalist  Unit at Time of Referral  Med/Surg Unit  Palliative Care Primary Diagnosis  Pulmonary  Date Notified  12/04/17  Palliative Care Type  New Palliative care  Reason for referral  Clarify Goals of Care, Counsel Regarding Hospice, Psychosocial or Spiritual support  Date of Admission  12/06/2017  Date first seen by Palliative Care  12/04/17  # of days Palliative referral response time  0 Day(s)  # of days IP prior to Palliative referral  2  Clinical Assessment  Palliative Performance Scale Score  30%  Pain Max last 24 hours  Not able to report  Pain Min Last 24 hours  Not able to report  Dyspnea Max Last 24 Hours  Not able to report  Dyspnea Min Last 24 hours  Not able to report  Nausea Max Last 24 Hours  Not able to report  Nausea Min Last 24 Hours  Not able to report  Anxiety Max Last 24 Hours  Not able to report  Anxiety Min Last 24 Hours  Not able to report  Psychosocial & Spiritual Assessment  Palliative Care Outcomes  Patient/Family meeting held?  Yes  Who was at the meeting?  pt's wife and dtr, [name]  Patient/Family wishes: Interventions discontinued/not started   Mechanical Ventilation, Trach      Time In: 1400 Time Out: 1510 Time Total: 70 min Greater than 50%  of this time was spent counseling and coordinating care related to the above assessment and plan.  Signed by: Dory Horn, NP   Please contact Palliative Medicine Team phone at [phone] for questions and concerns.  For individual provider: See Shea Evans

## 2017-12-04 NOTE — Progress Notes (Signed)
PT Cancellation Note  Patient Details Name: [name] MRN: [id number] DOB: [birth year]   Cancelled Treatment:    Reason Eval/Treat Not Completed: Medical issues which prohibited therapy;Patient not medically ready(Pt is now DNR and considering comfort measures. Sign off. )   Denice Paradise 12/04/2017, 10:16 AM  [facility] [phone] (pager)

## 2017-12-04 NOTE — Telephone Encounter (Signed)
Noted  

## 2017-12-04 NOTE — Progress Notes (Signed)
I have reviewed [name]'s charting for the day on this patient and agree with her findings.

## 2017-12-04 NOTE — Progress Notes (Signed)
SLP Cancellation Note  Patient Details Name: [name] MRN: [id number] DOB: 06/13/1931   Cancelled treatment:       Reason Eval/Treat Not Completed: Patient not medically ready. Pt on BiPAP. Reviewed results of MBS with family, showed wife the images. High risk of postprandial aspiration due to esophageal dysphagia. Will follow for needs.    [name], [name] 12/04/2017, 1:00 PM

## 2017-12-04 NOTE — Progress Notes (Signed)
Placed PT on BiPAP per MD.

## 2017-12-05 DIAGNOSIS — F0390 Unspecified dementia without behavioral disturbance: Secondary | ICD-10-CM

## 2017-12-05 DIAGNOSIS — N183 Chronic kidney disease, stage 3 (moderate): Secondary | ICD-10-CM

## 2017-12-05 LAB — PROTIME-INR
INR: 2.43
PROTHROMBIN TIME: 26.2 s — AB (ref 11.4–15.2)

## 2017-12-05 LAB — MRSA PCR SCREENING: MRSA by PCR: NEGATIVE

## 2017-12-05 MED ORDER — WARFARIN - PHARMACIST DOSING INPATIENT: Freq: Every day | Status: DC

## 2017-12-05 MED ORDER — WARFARIN 1.25 MG HALF TABLET
1.2500 mg | ORAL_TABLET | Freq: Once | ORAL | Status: DC
  Filled 2017-12-05: qty 1

## 2017-12-05 MED ORDER — LORAZEPAM 2 MG/ML IJ SOLN
0.5000 mg | INTRAMUSCULAR | Status: DC | PRN
  Administered 2017-12-05 – 2017-12-07 (×3): 0.5 mg via INTRAVENOUS
  Filled 2017-12-05 (×3): qty 1

## 2017-12-05 MED ORDER — WARFARIN 1.25 MG HALF TABLET
1.2500 mg | ORAL_TABLET | Freq: Once | ORAL | Status: AC
  Administered 2017-12-05: 1.25 mg via ORAL
  Filled 2017-12-05: qty 1

## 2017-12-05 MED ORDER — WARFARIN SODIUM 2.5 MG PO TABS
12.5000 mg | ORAL_TABLET | Freq: Every day | ORAL | Status: DC
  Filled 2017-12-05: qty 1

## 2017-12-05 MED ORDER — MORPHINE SULFATE (PF) 2 MG/ML IV SOLN
2.0000 mg | INTRAVENOUS | Status: DC
  Administered 2017-12-05 – 2017-12-06 (×5): 2 mg via INTRAVENOUS
  Filled 2017-12-05 (×5): qty 1

## 2017-12-05 NOTE — Plan of Care (Signed)
  Problem: Clinical Measurements: Goal: Respiratory complications will improve Outcome: Not Progressing Note:  Pt was taken off of BiPAP and placed on 5 L [place] in order for them to take their 2200 medications. Pt O2 saturation dropped into the 70's. Pt placed back on BiPAP and O3 saturation returned to the 90's.

## 2017-12-05 NOTE — Progress Notes (Signed)
Daily Progress Note   Patient Name: [name]       Date: 12/05/2017 DOB: [birth year]  Age: 84 y.o. MRN#: [id number] Attending Physician: Velvet Bathe, MD Primary Care Physician: Ria Bush, MD Admit Date: 11/07/2017  Reason for Consultation/Follow-up: Establishing goals of care, Non pain symptom management, Pain control, Psychosocial/spiritual support and Terminal Care  Subjective: Patient seen, chart reviewed.  Patient was unable to tolerate having the BiPAP removed even to receive p.m. meds without his sats dropping into the 70s and experiencing associated chest pain.  Patient is asking for the BiPAP.  He is alert this morning.  He is oriented to himself, recognizes his family.  Patient is mottled to his knees and feet this morning; cool to the touch  Family continues to verbalize ongoing decline that they have witnessed just since being admitted to the hospital.  They seem to be coming to grips with patient's poor prognosis of potentially just days to weeks  We also discussed at length that BiPAP could be turning into a form of life support, which patient would not want.  We discussed opioids as a tool to help patient come off of the BiPAP  Length of Stay: 3  Current Medications: Scheduled Meds:  . aspirin EC  81 mg Oral Daily  . mouth rinse  15 mL Mouth Rinse BID  .  morphine injection  2 mg Intravenous Q4H  . pantoprazole  40 mg Oral Daily  . sodium chloride flush  3 mL Intravenous Q12H  . warfarin  1.25 mg Oral ONCE-1800  . Warfarin - Pharmacist Dosing Inpatient   Does not apply q1800    Continuous Infusions: . sodium chloride    . ceFEPime (MAXIPIME) IV 1 g (12/05/17 1139)    PRN Meds: sodium chloride, acetaminophen **OR** acetaminophen, ipratropium-albuterol,  LORazepam, morphine injection, ondansetron **OR** ondansetron (ZOFRAN) IV, polyethylene glycol, sodium chloride flush  Physical Exam  Constitutional: He appears well-developed and well-nourished.  Ill appearing elderly man, wearing a BiPAP; he is alert  HENT:  Head: Normocephalic and atraumatic.  Neck: Normal range of motion.  Cardiovascular:  Irregular  Pulmonary/Chest:  Patient wearing a BiPAP  Neurological: He is alert.  Oriented to himself and recognizes his family  Skin: Skin is warm and dry. There is pallor.  Psychiatric:  Anxious  Nursing note  and vitals reviewed.           Vital Signs: BP (!) 107/49   Pulse 92   Temp 98.5 F (36.9 C) (Axillary)   Resp (!) 21   Ht 5\' 6"  (1.676 m)   Wt 78.8 kg (173 lb 11.6 oz)   SpO2 100%   BMI 28.04 kg/m  SpO2: SpO2: 100 % O2 Device: O2 Device: Bi-PAP O2 Flow Rate: O2 Flow Rate (L/min): 6 L/min  Intake/output summary:   Intake/Output Summary (Last 24 hours) at 12/05/2017 1204 Last data filed at 12/05/2017 6433 Gross per 24 hour  Intake 86 ml  Output 1150 ml  Net -1064 ml   LBM: Last BM Date: 12/04/17 Baseline Weight: Weight: 78 kg (172 lb) Most recent weight: Weight: 78.8 kg (173 lb 11.6 oz)       Palliative Assessment/Data:    Flowsheet Rows     Most Recent Value  Intake Tab  Referral Department  Hospitalist  Unit at Time of Referral  Med/Surg Unit  Palliative Care Primary Diagnosis  Pulmonary  Date Notified  12/04/17  Palliative Care Type  New Palliative care  Reason for referral  Clarify Goals of Care, Counsel Regarding Hospice, Psychosocial or Spiritual support  Date of Admission  11/25/2017  Date first seen by Palliative Care  12/04/17  # of days Palliative referral response time  0 Day(s)  # of days IP prior to Palliative referral  2  Clinical Assessment  Palliative Performance Scale Score  30%  Pain Max last 24 hours  Not able to report  Pain Min Last 24 hours  Not able to report  Dyspnea Max Last 24  Hours  Not able to report  Dyspnea Min Last 24 hours  Not able to report  Nausea Max Last 24 Hours  Not able to report  Nausea Min Last 24 Hours  Not able to report  Anxiety Max Last 24 Hours  Not able to report  Anxiety Min Last 24 Hours  Not able to report  Psychosocial & Spiritual Assessment  Palliative Care Outcomes  Patient/Family meeting held?  Yes  Who was at the meeting?  pt's wife and dtr, [name]  Patient/Family wishes: Interventions discontinued/not started   Mechanical Ventilation, Trach      Patient Active Problem List   Diagnosis Date Noted  . Palliative care by specialist   . Acute respiratory failure with hypoxia ([place]) 12/04/2017  . Multifocal pneumonia 11/25/2017  . Elevated troponin 12/03/2017  . Acute respiratory failure ([place]) 11/27/2017  . Abdominal wall pain in right lower quadrant 11/26/2017  . Chronic left hip pain 07/28/2017  . Long term (current) use of anticoagulants 06/12/2017  . Subacute prostatitis 06/10/2017  . Gait abnormality 03/16/2017  . Acute deep vein thrombosis (DVT) of left tibial vein ([place]) 08/05/2016  . Goals of care, counseling/discussion 04/20/2016  . Left displaced femoral neck fracture ([place]) 03/11/2016  . History of arterial ischemic stroke 11/16/2015  . 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 Esophageal stenosis   . (HFpEF) heart failure with preserved ejection fraction ([place]) 11/12/2015  . Recurrent UTI 05/13/2015  . Drug rash 04/09/2015  . Chest pain at rest 03/29/2015  . Pedal edema 02/07/2015  . CN (constipation) 01/15/2015  . Orthostatic hypotension 11/24/2014  . Syncope 11/07/2014  . Advanced care planning/counseling discussion 11/06/2014  . Daytime somnolence 11/06/2014  . Macrocytic anemia   . History of colonic polyps 03/27/2014  . Trigger finger, acquired 01/05/2014  . Osteoporosis 11/06/2013  .  Dementia 06/08/2013  . Vertigo 01/28/2013  . Sensorineural hearing loss (SNHL) of both ears 06/07/2012  . GERD  (gastroesophageal reflux disease) 04/30/2012  . Benign esophageal stricture 12/30/2011  . HLD (hyperlipidemia) 12/08/2011  . MDD (major depressive disorder), recurrent episode, moderate ([place])   . Medicare annual wellness visit, subsequent 06/09/2011  . General unsteadiness 04/16/2010  . Vitamin B12 deficiency 11/01/2008  . Vitamin D deficiency 11/01/2008  . Essential tremor 09/27/2008  . Thrombocytopenia ([place]) 03/08/2007  . Coronary atherosclerosis 03/08/2007  . CKD (chronic kidney disease) stage 3, GFR 30-59 ml/min (HCC) 03/08/2007  . Urge incontinence 03/08/2007  . RENAL CALCULUS, HX OF 03/08/2007  . Phlebitis and thrombophlebitis 01/04/2007  . Personal history of pulmonary embolism 09/08/1990    Palliative Care Assessment & Plan   Patient Profile: 84 y.o. male  with past medical history of obstructive sleep apnea (was on BiPAP remotely but with weight loss was able to stop BiPAP), dementia, questionable Parkinson's disease, chronic kidney disease stage III, coronary artery disease, hyperlipidemia, history of CVA admitted on 11/19/2017 with chest pain and shortness of breath.  Per chest x-ray, patient has multifocal pneumonia.  Consult ordered for "end-of-life care", goals of care.  Despite antibiotics and other supportive care, patient's respiratory status continues to decline and now is almost BiPAP dependent.  He also continues to verbalize chest pain.  Began discussions of transitioning towards a MS04 continuous infusion in order to help liberate patient from BiPAP   Recommendations/Plan:  Dyspnea: Start scheduled morphine 2 mg every 4 hours, N 1-3 mg every 1 hour as needed for breakthrough shortness of breath.  Continue BiPAP for now with attempts to see how he tolerates coming off of BiPAP once morphine has been started  Anxiety: We will add low-dose Ativan as needed for associated anxiety secondary to hypoxia  Chest pain: We will start scheduled morphine  Goals of Care and  Additional Recommendations:  Limitations on Scope of Treatment: Minimize Medications, No Artificial Feeding, No Blood Transfusions, No Chemotherapy, No Hemodialysis, No Radiation, No Surgical Procedures and No Tracheostomy  Code Status:    Code Status Orders  (From admission, onward)        Start     Ordered   12/04/17 0815  Do not attempt resuscitation (DNR)  Continuous    Question Answer Comment  In the event of cardiac or respiratory ARREST Do not call a "code blue"   In the event of cardiac or respiratory ARREST Do not perform Intubation, CPR, defibrillation or ACLS   In the event of cardiac or respiratory ARREST Use medication by any route, position, wound care, and other measures to relive pain and suffering. May use oxygen, suction and manual treatment of airway obstruction as needed for comfort.      12/04/17 0815    Code Status History    Date Active Date Inactive Code Status Order ID Comments User Context   11/24/2017 1228 12/04/2017 0815 Partial Code [id number]  Dionne Milo, NP ED   12/05/2017 1151 11/25/2017 1228 Partial Code [id number]  Charlie Pitter, PA-C ED   03/10/2016 0322 03/13/2016 1812 Full Code [id number]  Etta Quill, DO ED   11/12/2015 1115 11/16/2015 1720 Full Code [id number]  Rondel Jumbo, PA-C ED   11/12/2015 1115 11/12/2015 1115 Full Code [id number]  Rondel Jumbo, PA-C ED   03/29/2015 2241 03/30/2015 2004 Full Code [id number]  Sid Falcon, MD Inpatient   11/06/2014 2201 11/09/2014 2000 Full Code [id number]  Shanda Howells,  MD ED   05/29/2014 2228 06/08/2014 1903 Full Code [id number]  Ivor Costa, MD ED    Advance Directive Documentation     Most Recent Value  Type of Advance Directive  Healthcare Power of Attorney, Living will  Pre-existing out of facility DNR order (yellow form or pink MOST form)  -  "MOST" Form in Place?  -       Prognosis:   < 2 weeks in the setting of recurrent aspiration, multifocal pneumonia, worsening heart failure with an ejection  fraction of 35%; patient is becoming BiPAP dependent.  He is unable to tolerate coming off the BiPAP even for periods of time to take oral medication without dropping his sats and experiencing associated chest pain.  He is also mottled  Discharge Planning:  Patient would qualify for residential hospice in my opinion secondary to a prognosis of less than 2 weeks as well as being symptomatically short of breath.  Bigger question remains of how to keep patient comfortable, liberate him from the BiPAP in order to facilitate disposition options  Care plan was discussed with Dr Wendee Beavers  Thank you for allowing the Palliative Medicine Team to assist in the care of this patient.   Time In: 0830 Time Out: 0945 Total Time 75 min Prolonged Time Billed  yes       Greater than 50%  of this time was spent counseling and coordinating care related to the above assessment and plan.  Dory Horn, NP  Please contact Palliative Medicine Team phone at [phone] for questions and concerns.

## 2017-12-05 NOTE — Progress Notes (Signed)
Administered morphine and ativan per MAR. Patient resting comfortably.  Removed bipap per Palliative.  After 15 minutes patients O2 sats dropped to 77%, HR 101, and RR 20.  Patient was asleep and resting comfortably the whole 15 minutes.    Reapplied bipap to increase O2 sats.

## 2017-12-05 NOTE — Progress Notes (Signed)
Chart reviewed- CXR shows multifocal pneumonia - on BIPAP with inability to wean. Given lasix yesterday with 1L negative, but no significant improvement. Palliative care on board and patient is DNR/DNI, but not full comfort. If he continues to decline on BIPAP, comfort care is the only option.  Cardiology will be available as needed over the weekend.  Pixie Casino, MD, [facility], [place] Director of the Advanced Lipid Disorders &  Cardiovascular Risk Reduction Clinic Diplomate of the American Board of Clinical Lipidology Attending Cardiologist  Direct Dial: [phone]  Fax: [phone]  Website:  [email or web address]

## 2017-12-05 NOTE — Progress Notes (Signed)
ANTICOAGULATION CONSULT NOTE - Follow-Up Consult  Pharmacy Consult for Warfarin Indication: hx PE/DVT   Patient Measurements: Height: 5\' 6"  (167.6 cm) Weight: 173 lb 11.6 oz (78.8 kg) IBW/kg (Calculated) : 63.8  Vital Signs: Temp: 98.2 F (36.8 C) (03/30 0844) Temp Source: Axillary (03/30 0844) BP: 115/58 (03/30 0844) Pulse Rate: 92 (03/30 0414)  Labs: Recent Labs    11/28/2017 1444 11/09/2017 1830 12/03/17 0003 12/03/17 0522 12/04/17 0242 12/05/17 0223  HGB  --   --   --  10.1*  --   --   HCT  --   --   --  31.5*  --   --   PLT  --   --   --  145*  --   --   LABPROT  --   --   --  23.8* 26.5* 26.2*  INR  --   --   --  2.15 2.46 2.43  CREATININE  --   --   --  1.94*  --   --   TROPONINI 1.12* 1.37* 2.25*  --   --   --     Estimated Creatinine Clearance: 28 mL/min (A) (by C-G formula based on SCr of 1.94 mg/dL (H)).   Assessment: 52 YOM with on warfarin at home for remote hx of extensive PE/DVT presents with chest pain and shortness of breath. Pharmacy consulted to resume warfarin for anticoagulation.   INR therapeutic today at 2.43.  Dose reduced yesterday d/t decreased diet and flagyl interaction (since discontinued), with overall decline.  CBC stable and no bleeding noted at this time.  Goal of Therapy:  INR 2-3 Monitor platelets by anticoagulation protocol: Yes   Plan:  Will repeat warfarin 1.25mg  x1 tonight Monitor daily INR, CBC, s/s bleeding and clinical progression  Bertis Ruddy, PharmD Pharmacy Resident Pager #: [phone] 12/05/2017 11:48 AM

## 2017-12-05 NOTE — Progress Notes (Signed)
SLP Cancellation Note  Patient Details Name: NEIKO TRIVEDI MRN: [id number] DOB: February 15, 1931   Cancelled treatment:       Reason Eval/Treat Not Completed: Patient not medically ready, remains on BiPAP this morning.   Germain Osgood 12/05/2017, 10:43 AM  Germain Osgood, M.A. CCC-SLP [phone]

## 2017-12-05 NOTE — Progress Notes (Signed)
PROGRESS NOTE    ROTH RESS  TOI:[id number] DOB: June 19, 1931 DOA: 12/05/2017 PCP: Ria Bush, MD    Brief Narrative:  84 y/o pt with history of recurrent PE/DVT on chronic anticoagulation who presented with chest pain and elevated troponins. Dx with pna on work up.  Pt's condition is deteriorating. Is currently on Bipap and patient becoming bipap dependent per my discussion with staff and palliative care.  Plan is to try and transition to hospice with bipap   Assessment & Plan:   Principal Problem:   Acute respiratory failure with hypoxia (HCC) - 2ary to pna - continue supportive therapy with supplemental oxygen and bipap. Palliative on board to assist with medications for sob.     Elevated troponin - per cardiology. No aggressive work up planned. Pt had deterioration in condition and plan is for comfort care. Palliative team consulted.  - increased morphine given reports of chest discomfort.   Active Problems:   CKD (chronic kidney disease) stage 3, GFR 30-59 ml/min (HCC)   Essential tremor   HLD (hyperlipidemia)   GERD (gastroesophageal reflux disease)   Dementia   Macrocytic anemia   (HFpEF) heart failure with preserved ejection fraction (HCC)   Elevated factor VIII level   Long term (current) use of anticoagulants   CAP (community acquired pneumonia)   DVT prophylaxis: Warfarin Code Status: Partial: intubation but no chest compressions Family Communication: d/c family today Disposition Plan: Palliative for comfort care. Most likely transition to residential hospice   Consultants:   cardiology   Procedures: none   Antimicrobials: cefepime, flagyl, vancomycin   Subjective: Pt has no new complaints currently.    Objective: Vitals:   12/05/17 0116 12/05/17 0200 12/05/17 0414 12/05/17 0844  BP: (!) 90/46 (!) 100/49 110/64 (!) 115/58  Pulse: 79  92   Resp: 14 13 19  (!) 21  Temp: 98.1 F (36.7 C)  (!) 97.4 F (36.3 C) 98.2 F (36.8 C)    TempSrc: Oral  Axillary Axillary  SpO2: 99% 99% 99% 100%  Weight:      Height:        Intake/Output Summary (Last 24 hours) at 12/05/2017 1039 Last data filed at 12/05/2017 0912 Gross per 24 hour  Intake 86 ml  Output 1150 ml  Net -1064 ml   Filed Weights   11/14/2017 0709 11/20/2017 1800  Weight: 78 kg (172 lb) 78.8 kg (173 lb 11.6 oz)    Examination:   General exam: awake and alert, in nad.  Respiratory system: Pt on bipap, equal chest rise. Cardiovascular system: S1 & S2 heard, no rubs.  Gastrointestinal system: Abdomen is nondistended, soft and nontender.  Central nervous system: Alert and oriented. No focal neurological deficits. Extremities: warm and dry. Skin: No rashes, lesions or ulcers, on limited exam. Psychiatry:  Mood & affect appropriate.    Data Reviewed: I have personally reviewed following labs and imaging studies  CBC: Recent Labs  Lab 12/01/2017 0801 11/10/2017 0807 12/03/17 0522  WBC 10.8*  --  8.4  HGB 10.8* 10.5* 10.1*  HCT 33.7* 31.0* 31.5*  MCV 119.9*  --  120.7*  PLT 138*  --  833*   Basic Metabolic Panel: Recent Labs  Lab 11/12/2017 0801 11/10/2017 0807 12/03/17 0522 12/03/17 1405  NA 140 142 139  --   K 4.8 4.8 5.3* 4.7  CL 107 107 108  --   CO2 23  --  23  --   GLUCOSE 142* 137* 190*  --   BUN 26*  26* 31*  --   CREATININE 1.60* 1.60* 1.94*  --   CALCIUM 9.2  --  8.6*  --    GFR: Estimated Creatinine Clearance: 28 mL/min (A) (by C-G formula based on SCr of 1.94 mg/dL (H)). Liver Function Tests: Recent Labs  Lab 12/03/17 0003  AST 47*  ALT 27  ALKPHOS 42  BILITOT 2.1*  PROT 5.6*  ALBUMIN 2.9*   No results for input(s): LIPASE, AMYLASE in the last 168 hours. No results for input(s): AMMONIA in the last 168 hours. Coagulation Profile: Recent Labs  Lab 12/01/17 12/05/2017 0801 12/03/17 0522 12/04/17 0242 12/05/17 0223  INR 2.3 2.11 2.15 2.46 2.43   Cardiac Enzymes: Recent Labs  Lab 12/03/2017 1444 12/01/2017 1830  12/03/17 0003  TROPONINI 1.12* 1.37* 2.25*   BNP (last 3 results) No results for input(s): PROBNP in the last 8760 hours. HbA1C: No results for input(s): HGBA1C in the last 72 hours. CBG: No results for input(s): GLUCAP in the last 168 hours. Lipid Profile: Recent Labs    12/03/17 0522  CHOL 130  HDL 15*  LDLCALC 69  TRIG 231*  CHOLHDL 8.7   Thyroid Function Tests: No results for input(s): TSH, T4TOTAL, FREET4, T3FREE, THYROIDAB in the last 72 hours. Anemia Panel: No results for input(s): VITAMINB12, FOLATE, FERRITIN, TIBC, IRON, RETICCTPCT in the last 72 hours. Sepsis Labs: Recent Labs  Lab 12/04/2017 1444 12/01/2017 1830 12/03/17 0522 12/04/17 0242  PROCALCITON 0.58  --  0.78 0.40  LATICACIDVEN 1.7 1.8  --   --     Recent Results (from the past 240 hour(s))  Culture, blood (routine x 2) Call MD if unable to obtain prior to antibiotics being given     Status: None (Preliminary result)   Collection Time: 11/06/2017  2:44 PM  Result Value Ref Range Status   Specimen Description BLOOD LEFT HAND  Final   Special Requests   Final    BOTTLES DRAWN AEROBIC ONLY Blood Culture results may not be optimal due to an inadequate volume of blood received in culture bottles   Culture   Final    NO GROWTH 2 DAYS Performed at [facility], [place] [address] [place], [place] [address]    Report Status PENDING  Incomplete  Culture, blood (routine x 2) Call MD if unable to obtain prior to antibiotics being given     Status: None (Preliminary result)   Collection Time: 11/21/2017  3:09 PM  Result Value Ref Range Status   Specimen Description BLOOD RIGHT HAND  Final   Special Requests   Final    BOTTLES DRAWN AEROBIC AND ANAEROBIC Blood Culture adequate volume   Culture   Final    NO GROWTH 2 DAYS Performed at [facility], [place] [address], [place], [place] [address]    Report Status PENDING  Incomplete  MRSA PCR Screening     Status: None   Collection Time: 12/04/17 10:51 PM   Result Value Ref Range Status   MRSA by PCR NEGATIVE NEGATIVE Final    Comment:        The GeneXpert MRSA Assay (FDA approved for NASAL specimens only), is one component of a comprehensive MRSA colonization surveillance program. It is not intended to diagnose MRSA infection nor to guide or monitor treatment for MRSA infections. Performed at [facility], [place] [address], [place], [place] [address]          Radiology Studies: No results found.  Scheduled Meds: . aspirin EC  81 mg Oral Daily  . mouth rinse  15 mL Mouth Rinse BID  .  morphine injection  2 mg Intravenous Q4H  . pantoprazole  40 mg Oral Daily  . sodium chloride flush  3 mL Intravenous Q12H  . Warfarin - Pharmacist Dosing Inpatient   Does not apply q1800   Continuous Infusions: . sodium chloride    . ceFEPime (MAXIPIME) IV       LOS: 3 days    Time spent: 15 minutes    Velvet Bathe, MD [facility] Pager [phone]  If 7PM-7AM, please contact night-coverage [email or web address] Password [facility] 12/05/2017, 10:39 AM

## 2017-12-06 ENCOUNTER — Inpatient Hospital Stay (HOSPITAL_COMMUNITY): Payer: Medicare Other

## 2017-12-06 LAB — PROTIME-INR
INR: 2.95
Prothrombin Time: 30.5 seconds — ABNORMAL HIGH (ref 11.4–15.2)

## 2017-12-06 MED ORDER — GLYCOPYRROLATE 0.2 MG/ML IJ SOLN
0.2000 mg | Freq: Three times a day (TID) | INTRAMUSCULAR | Status: DC
  Administered 2017-12-06 – 2017-12-07 (×2): 0.2 mg via INTRAVENOUS
  Filled 2017-12-06 (×2): qty 1

## 2017-12-06 MED ORDER — GLYCOPYRROLATE 0.2 MG/ML IJ SOLN: 0.2000 mg | Freq: Four times a day (QID) | INTRAMUSCULAR | Status: DC | PRN

## 2017-12-06 MED ORDER — MORPHINE 100MG IN NS 100ML (1MG/ML) PREMIX INFUSION
2.0000 mg/h | INTRAVENOUS | Status: DC
  Administered 2017-12-06: 0.5 mg/h via INTRAVENOUS
  Administered 2017-12-07: 4 mg/h via INTRAVENOUS
  Filled 2017-12-06: qty 100

## 2017-12-06 MED ORDER — MORPHINE BOLUS VIA INFUSION
1.0000 mg | INTRAVENOUS | Status: DC | PRN
  Administered 2017-12-07 (×2): 1 mg via INTRAVENOUS
  Filled 2017-12-06: qty 1

## 2017-12-06 NOTE — Progress Notes (Signed)
ANTICOAGULATION CONSULT NOTE - Follow-Up Consult  Pharmacy Consult for Warfarin Indication: hx PE/DVT   Patient Measurements: Height: 5\' 6"  (167.6 cm) Weight: 173 lb 11.6 oz (78.8 kg) IBW/kg (Calculated) : 63.8  Vital Signs: Temp: 99.2 F (37.3 C) (03/31 0722) Temp Source: Tympanic (03/31 0722) BP: 109/53 (03/31 0722)  Labs: Recent Labs    12/04/17 0242 12/05/17 0223 12/06/17 0422  LABPROT 26.5* 26.2* 30.5*  INR 2.46 2.43 2.95    Estimated Creatinine Clearance: 28 mL/min (A) (by C-G formula based on SCr of 1.94 mg/dL (H)).   Assessment: 60 YOM with on warfarin at home for remote hx of extensive PE/DVT presents with chest pain and shortness of breath. Pharmacy consulted to resume warfarin for anticoagulation.   INR at upper end of therapeutic today at 2.95.  Dose reduced last two days d/t decreased diet and flagyl interaction (since discontinued), with overall decline. CBC on 3/28 stable, RN reports pt is coughing up blood this AM.    Goal of Therapy:  INR 2-3 Monitor platelets by anticoagulation protocol: Yes   Plan:  Will hold warfarin tonight d/t sharp increase in INR and Pt coughing up blood F/u bleeding reports, overall GOC Monitor daily INR, s/s bleeding  Bertis Ruddy, PharmD Pharmacy Resident Pager #: [phone] 12/06/2017 10:15 AM

## 2017-12-06 NOTE — Plan of Care (Signed)
  Problem: Clinical Measurements: Goal: Respiratory complications will improve Outcome: Progressing  Pt was able to come off BiPAP for about 10 minutes to take his HS medications and drink 2 cups of apple juice. Pts O2 saturations only dropped to 88% while being off the BiPAP to take his medications. After drinking fluids and taking medication, pt was put back on BiPAP for comfort and to maintain O2 saturations >92%. Pt has been resting comfortably on BiPAP in NAD during the shift with wife at bedside.

## 2017-12-06 NOTE — Progress Notes (Signed)
SLP Cancellation Note  Patient Details Name: [name] MRN: [id number] DOB: 12/03/1930   Cancelled treatment:       Reason Eval/Treat Not Completed: Patient not medically ready(Pt continues to require BiPAP. Will continue efforts.)  [name] [facility], [place] Speech Language Pathologist [phone]  Shonna Chock 12/06/2017, 10:42 AM

## 2017-12-06 NOTE — Progress Notes (Addendum)
Daily Progress Note   Patient Name: [name]       Date: 12/06/2017 DOB: February 12, 1931  Age: 84 y.o. MRN#: [id number] Attending Physician: Velvet Bathe, MD Primary Care Physician: Ria Bush, MD Admit Date: 12/03/2017  Reason for Consultation/Follow-up: Establishing goals of care, Non pain symptom management, Pain control and Psychosocial/spiritual support  Subjective: Pt seen, chart reviewed. Pt unable to tolerate coming off BIPAP for more than 15 min without dropping his sats. Spouse reports 2 episodes of sputum with bright red blood. Pt trending towards comfort care but also still wishing to cont abx for now. Family is coming to terms with despite treatment he is not getting better as evidenced by bipap dependency.   Scheduled MS04 started yesterday and per family he has been more comfortable but he has continued to say " I need to calm down". He had an episode yesterday where he became acutely agitated and trying to climb out of the bed  Discussed the role of continuous infusion to mange dyspnea with assoc chest pain  Length of Stay: 4  Current Medications: Scheduled Meds:  . aspirin EC  81 mg Oral Daily  . mouth rinse  15 mL Mouth Rinse BID  .  morphine injection  2 mg Intravenous Q4H  . pantoprazole  40 mg Oral Daily  . sodium chloride flush  3 mL Intravenous Q12H  . Warfarin - Pharmacist Dosing Inpatient   Does not apply QHS    Continuous Infusions: . sodium chloride    . ceFEPime (MAXIPIME) IV Stopped (12/06/17 1012)  . morphine      PRN Meds: sodium chloride, acetaminophen **OR** acetaminophen, ipratropium-albuterol, LORazepam, morphine injection, morphine, ondansetron **OR** ondansetron (ZOFRAN) IV, polyethylene glycol, sodium chloride flush  Physical Exam    Constitutional: He appears well-developed and well-nourished.  Cardiovascular:  afib  Pulmonary/Chest:  Wearing bipap Per spouse, 2 episodes of bright red blood seen in sputum with cough last night  Neurological:  somnalent  Skin: Skin is warm and dry. There is pallor.  Psychiatric:  Currently calm but had an episode of agitation last night  Nursing note and vitals reviewed.           Vital Signs: BP (!) 109/53 (BP Location: Left Arm)   Pulse 86   Temp 99.2 F (37.3 C) (Tympanic)  Resp (!) 22   Ht 5\' 6"  (1.676 m)   Wt 78.8 kg (173 lb 11.6 oz)   SpO2 100%   BMI 28.04 kg/m  SpO2: SpO2: 100 % O2 Device: O2 Device: Bi-PAP O2 Flow Rate: O2 Flow Rate (L/min): 6 L/min  Intake/output summary:   Intake/Output Summary (Last 24 hours) at 12/06/2017 1016 Last data filed at 12/06/2017 0831 Gross per 24 hour  Intake 343 ml  Output 575 ml  Net -232 ml   LBM: Last BM Date: 12/04/17 Baseline Weight: Weight: 78 kg (172 lb) Most recent weight: Weight: 78.8 kg (173 lb 11.6 oz)       Palliative Assessment/Data:    Flowsheet Rows     Most Recent Value  Intake Tab  Referral Department  Hospitalist  Unit at Time of Referral  Med/Surg Unit  Palliative Care Primary Diagnosis  Pulmonary  Date Notified  12/04/17  Palliative Care Type  New Palliative care  Reason for referral  Clarify Goals of Care, Counsel Regarding Hospice, Psychosocial or Spiritual support  Date of Admission  11/19/2017  Date first seen by Palliative Care  12/04/17  # of days Palliative referral response time  0 Day(s)  # of days IP prior to Palliative referral  2  Clinical Assessment  Palliative Performance Scale Score  30%  Pain Max last 24 hours  Not able to report  Pain Min Last 24 hours  Not able to report  Dyspnea Max Last 24 Hours  Not able to report  Dyspnea Min Last 24 hours  Not able to report  Nausea Max Last 24 Hours  Not able to report  Nausea Min Last 24 Hours  Not able to report  Anxiety Max  Last 24 Hours  Not able to report  Anxiety Min Last 24 Hours  Not able to report  Psychosocial & Spiritual Assessment  Palliative Care Outcomes  Patient/Family meeting held?  Yes  Who was at the meeting?  pt's wife and dtr, [name]  Patient/Family wishes: Interventions discontinued/not started   Mechanical Ventilation, Trach      Patient Active Problem List   Diagnosis Date Noted  . Palliative care by specialist   . Acute respiratory failure with hypoxia ([place]) 11/08/2017  . Multifocal pneumonia 12/06/2017  . Elevated troponin 12/04/2017  . Acute respiratory failure ([place]) 11/11/2017  . Abdominal wall pain in right lower quadrant 11/26/2017  . Chronic left hip pain 07/28/2017  . Long term (current) use of anticoagulants 06/12/2017  . Subacute prostatitis 06/10/2017  . Gait abnormality 03/16/2017  . Acute deep vein thrombosis (DVT) of left tibial vein ([place]) 08/05/2016  . Goals of care, counseling/discussion 04/20/2016  . Left displaced femoral neck fracture ([place]) 03/11/2016  . History of arterial ischemic stroke 11/16/2015  . 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 Esophageal stenosis   . (HFpEF) heart failure with preserved ejection fraction ([place]) 11/12/2015  . Recurrent UTI 05/13/2015  . Drug rash 04/09/2015  . Chest pain at rest 03/29/2015  . Pedal edema 02/07/2015  . CN (constipation) 01/15/2015  . Orthostatic hypotension 11/24/2014  . Syncope 11/07/2014  . Advanced care planning/counseling discussion 11/06/2014  . Daytime somnolence 11/06/2014  . Macrocytic anemia   . History of colonic polyps 03/27/2014  . Trigger finger, acquired 01/05/2014  . Osteoporosis 11/06/2013  . Dementia 06/08/2013  . Vertigo 01/28/2013  . Sensorineural hearing loss (SNHL) of both ears 06/07/2012  . GERD (gastroesophageal reflux disease) 04/30/2012  . Benign esophageal stricture 12/30/2011  . HLD (  hyperlipidemia) 12/08/2011  . MDD (major depressive disorder), recurrent episode, moderate  ([place])   . Medicare annual wellness visit, subsequent 06/09/2011  . General unsteadiness 04/16/2010  . Vitamin B12 deficiency 11/01/2008  . Vitamin D deficiency 11/01/2008  . Essential tremor 09/27/2008  . Thrombocytopenia ([place]) 03/08/2007  . Coronary atherosclerosis 03/08/2007  . CKD (chronic kidney disease) stage 3, GFR 30-59 ml/min (HCC) 03/08/2007  . Urge incontinence 03/08/2007  . RENAL CALCULUS, HX OF 03/08/2007  . Phlebitis and thrombophlebitis 01/04/2007  . Personal history of pulmonary embolism 09/08/1990    Palliative Care Assessment & Plan   Patient Profile: 84 y.o.malewith past medical history of obstructive sleep apnea (was on BiPAP remotely but with weight loss was able to stop BiPAP), dementia, questionable Parkinson's disease, chronic kidney disease stage III, coronary artery disease, hyperlipidemia, history of CVAadmitted on03/15/2019with chest pain and shortness of breath. Per chest x-ray, patient has multifocal pneumonia.  Consult ordered for "end-of-life care", goals of care.  Despite antibiotics and other supportive care, patient's respiratory status continues to decline and now is almost BiPAP dependent.  He also continues to verbalize chest pain. Will start MS04 continuous infusion in order to help liberate patient from BiPAP    Recommendations/Plan:  Dyspnea: Family agreeable to start gtt. Will start MS04 infusion at 0.5mg /hr and 1mg  q30 min PRN  Chest pain: Starting MS04 infusion  Anxiety: Improving but will uptirate MS04 for dyspnea with assoc anxiety  Palliative to help pt work towards coming off BIPAP. Will also reach out to residential hospice of [facility] on 22-Dec-2017, to see if they can assist in this transition as pt is nearing EOL with likely prognosis of days to weeks even on bipap  Will order repeat CXR to see if this helps clarify for spouse where pt is in regards to clinical status. Spouse is a NP and understands the potential limitation of  repeat CXR at this point.  Cont abx for now as well as coumadin    Code Status:    Code Status Orders  (From admission, onward)        Start     Ordered   12/04/17 0815  Do not attempt resuscitation (DNR)  Continuous    Question Answer Comment  In the event of cardiac or respiratory ARREST Do not call a "code blue"   In the event of cardiac or respiratory ARREST Do not perform Intubation, CPR, defibrillation or ACLS   In the event of cardiac or respiratory ARREST Use medication by any route, position, wound care, and other measures to relive pain and suffering. May use oxygen, suction and manual treatment of airway obstruction as needed for comfort.      12/04/17 0815    Code Status History    Date Active Date Inactive Code Status Order ID Comments User Context   11/13/2017 1228 12/04/2017 0815 Partial Code [id number]  Dionne Milo, NP ED   11/19/2017 1151 12/02/2017 1228 Partial Code [id number]  Charlie Pitter, PA-C ED   03/10/2016 0322 03/13/2016 1812 Full Code [id number]  Etta Quill, DO ED   11/12/2015 1115 11/16/2015 1720 Full Code [id number]  Rondel Jumbo, PA-C ED   11/12/2015 1115 11/12/2015 1115 Full Code [id number]  Rondel Jumbo, PA-C ED   03/29/2015 2241 03/30/2015 2004 Full Code [id number]  Sid Falcon, MD Inpatient   11/06/2014 2201 11/09/2014 2000 Full Code [id number]  Shanda Howells, MD ED   05/29/2014 2228 06/08/2014 1903 Full Code [id number]  Ivor Costa, MD  ED    Advance Directive Documentation     Most Recent Value  Type of Advance Directive  Healthcare Power of Attorney, Living will  Pre-existing out of facility DNR order (yellow form or pink MOST form)  -  "MOST" Form in Place?  -       Prognosis:    < 2 weeks in the setting of recurrent aspiration, multifocal pneumonia, worsening heart failure with an ejection fraction of 35%; patient is  BiPAP dependent.  He is unable to tolerate coming off the BiPAP even for periods of time to take oral medication without  dropping his sats and experiencing associated chest pain.  He is also mottled. Bright red blood noted in sputum  Discharge Planning:   Patient would qualify for residential hospice in my opinion secondary to a prognosis of less than 2 weeks as well as being symptomatically short of breath.  Bigger question remains of how to keep patient comfortable, liberate him from the BiPAP in order to facilitate disposition options      Care plan was discussed with Dr Wendee Beavers  Addendum 1745:Reviewed CXR impression with family.  IMPRESSION: Pneumonia and/or edema. Small layering pleural effusions that have increased from 4 days prior   Thank you for allowing the Palliative Medicine Team to assist in the care of this patient.   Time In: 0830 Time Out: 0930 Total Time 60 min Prolonged Time Billed  no       Greater than 50%  of this time was spent counseling and coordinating care related to the above assessment and plan.  Dory Horn, NP  Please contact Palliative Medicine Team phone at [phone] for questions and concerns.

## 2017-12-06 NOTE — Progress Notes (Signed)
PROGRESS NOTE    [name]  IRC:[id number] DOB: Feb 03, 1931 DOA: 11/18/2017 PCP: Ria Bush, MD    Brief Narrative:  84 y/o pt with history of recurrent PE/DVT on chronic anticoagulation who presented with chest pain and elevated troponins. Dx with pna on work up.  Pt's condition is deteriorating. Is currently on Bipap and patient becoming bipap dependent per my discussion with staff and palliative care.  Plan is to try and transition to hospice with bipap   Assessment & Plan:   Principal Problem:   Acute respiratory failure with hypoxia (HCC) - 2ary to pna - continue supportive therapy with supplemental oxygen and bipap. - Palliative assisting with goals of care and symptom management.     Elevated troponin - per cardiology. No aggressive work up planned. Pt had deterioration in condition and plan is for comfort care. Palliative team consulted.  - increased morphine given reports of chest discomfort.  - complicated case when patient desaturates he has chest pain. Pt is not candidate for aggressive measures and plan currently is for comfort care.   Active Problems:   CKD (chronic kidney disease) stage 3, GFR 30-59 ml/min (HCC)   Essential tremor   HLD (hyperlipidemia)   GERD (gastroesophageal reflux disease)   Dementia   Macrocytic anemia   (HFpEF) heart failure with preserved ejection fraction (HCC)   Elevated factor VIII level   Long term (current) use of anticoagulants   CAP (community acquired pneumonia)   DVT prophylaxis: Warfarin Code Status: Partial: intubation but no chest compressions Family Communication: no family at bedside. Discussed with them yesterday Disposition Plan:  Most likely transition to residential hospice   Consultants:   Cardiology  Palliative    Procedures: none   Antimicrobials: cefepime, flagyl, vancomycin   Subjective: No new complaints. Patient still bipap dependent  Objective: Vitals:   12/06/17 0316 12/06/17  0358 12/06/17 0722 12/06/17 0903  BP:   (!) 109/53   Pulse:      Resp:   17 (!) 22  Temp:  99.5 F (37.5 C) 99.2 F (37.3 C)   TempSrc:  Rectal Tympanic   SpO2: 98%  98% 100%  Weight:      Height:        Intake/Output Summary (Last 24 hours) at 12/06/2017 1140 Last data filed at 12/06/2017 0831 Gross per 24 hour  Intake 343 ml  Output 575 ml  Net -232 ml   Filed Weights   11/11/2017 0709 12/03/2017 1800  Weight: 78 kg (172 lb) 78.8 kg (173 lb 11.6 oz)    Examination: Exam unchanged when compared to prior 3/30  General exam: awake and alert, in nad.  Respiratory system: Pt on bipap, equal chest rise. No wheezes Cardiovascular system: S1 & S2 heard, no rubs.  Gastrointestinal system: Abdomen is nondistended, soft and nontender.  Central nervous system: Alert and oriented. No focal neurological deficits. Extremities: warm and dry. Skin: No rashes, lesions or ulcers, on limited exam. Psychiatry:  Mood & affect appropriate.    Data Reviewed: I have personally reviewed following labs and imaging studies  CBC: Recent Labs  Lab 11/27/2017 0801 11/22/2017 0807 12/03/17 0522  WBC 10.8*  --  8.4  HGB 10.8* 10.5* 10.1*  HCT 33.7* 31.0* 31.5*  MCV 119.9*  --  120.7*  PLT 138*  --  510*   Basic Metabolic Panel: Recent Labs  Lab 12/05/2017 0801 11/21/2017 0807 12/03/17 0522 12/03/17 1405  NA 140 142 139  --   K 4.8 4.8  5.3* 4.7  CL 107 107 108  --   CO2 23  --  23  --   GLUCOSE 142* 137* 190*  --   BUN 26* 26* 31*  --   CREATININE 1.60* 1.60* 1.94*  --   CALCIUM 9.2  --  8.6*  --    GFR: Estimated Creatinine Clearance: 28 mL/min (A) (by C-G formula based on SCr of 1.94 mg/dL (H)). Liver Function Tests: Recent Labs  Lab 12/03/17 0003  AST 47*  ALT 27  ALKPHOS 42  BILITOT 2.1*  PROT 5.6*  ALBUMIN 2.9*   No results for input(s): LIPASE, AMYLASE in the last 168 hours. No results for input(s): AMMONIA in the last 168 hours. Coagulation Profile: Recent Labs  Lab  11/09/2017 0801 12/03/17 0522 12/04/17 0242 12/05/17 0223 12/06/17 0422  INR 2.11 2.15 2.46 2.43 2.95   Cardiac Enzymes: Recent Labs  Lab 12/06/2017 1444 11/11/2017 1830 12/03/17 0003  TROPONINI 1.12* 1.37* 2.25*   BNP (last 3 results) No results for input(s): PROBNP in the last 8760 hours. HbA1C: No results for input(s): HGBA1C in the last 72 hours. CBG: No results for input(s): GLUCAP in the last 168 hours. Lipid Profile: No results for input(s): CHOL, HDL, LDLCALC, TRIG, CHOLHDL, LDLDIRECT in the last 72 hours. Thyroid Function Tests: No results for input(s): TSH, T4TOTAL, FREET4, T3FREE, THYROIDAB in the last 72 hours. Anemia Panel: No results for input(s): VITAMINB12, FOLATE, FERRITIN, TIBC, IRON, RETICCTPCT in the last 72 hours. Sepsis Labs: Recent Labs  Lab 11/30/2017 1444 11/10/2017 1830 12/03/17 0522 12/04/17 0242  PROCALCITON 0.58  --  0.78 0.40  LATICACIDVEN 1.7 1.8  --   --     Recent Results (from the past 240 hour(s))  Culture, blood (routine x 2) Call MD if unable to obtain prior to antibiotics being given     Status: None (Preliminary result)   Collection Time: 11/07/2017  2:44 PM  Result Value Ref Range Status   Specimen Description BLOOD LEFT HAND  Final   Special Requests   Final    BOTTLES DRAWN AEROBIC ONLY Blood Culture results may not be optimal due to an inadequate volume of blood received in culture bottles   Culture   Final    NO GROWTH 4 DAYS Performed at [facility], [place] [address] [place], [place] [address]    Report Status PENDING  Incomplete  Culture, blood (routine x 2) Call MD if unable to obtain prior to antibiotics being given     Status: None (Preliminary result)   Collection Time: 11/30/2017  3:09 PM  Result Value Ref Range Status   Specimen Description BLOOD RIGHT HAND  Final   Special Requests   Final    BOTTLES DRAWN AEROBIC AND ANAEROBIC Blood Culture adequate volume   Culture   Final    NO GROWTH 4 DAYS Performed at [facility], [place] [address] [place], [place] [address]    Report Status PENDING  Incomplete  MRSA PCR Screening     Status: None   Collection Time: 12/04/17 10:51 PM  Result Value Ref Range Status   MRSA by PCR NEGATIVE NEGATIVE Final    Comment:        The GeneXpert MRSA Assay (FDA approved for NASAL specimens only), is one component of a comprehensive MRSA colonization surveillance program. It is not intended to diagnose MRSA infection nor to guide or monitor treatment for MRSA infections. Performed at [facility], [place]  [address], [place], [place] [address]          Radiology Studies: No results found.      Scheduled Meds: . aspirin EC  81 mg Oral Daily  . mouth rinse  15 mL Mouth Rinse BID  .  morphine injection  2 mg Intravenous Q4H  . pantoprazole  40 mg Oral Daily  . sodium chloride flush  3 mL Intravenous Q12H  . Warfarin - Pharmacist Dosing Inpatient   Does not apply QHS   Continuous Infusions: . sodium chloride    . ceFEPime (MAXIPIME) IV Stopped (12/06/17 1012)  . morphine       LOS: 4 days    Time spent: 15 minutes    Velvet Bathe, MD [facility] Pager [phone]  If 7PM-7AM, please contact night-coverage [email or web address] Password [email or web address] 12/06/2017, 11:40 AM

## 2017-12-07 LAB — CULTURE, BLOOD (ROUTINE X 2)
CULTURE: NO GROWTH
Culture: NO GROWTH
Special Requests: ADEQUATE

## 2017-12-07 LAB — PROTIME-INR
INR: 3.35
PROTHROMBIN TIME: 33.7 s — AB (ref 11.4–15.2)

## 2017-12-07 MED ORDER — LORAZEPAM 2 MG/ML IJ SOLN
1.0000 mg | INTRAMUSCULAR | Status: DC | PRN
  Administered 2017-12-07 (×2): 1 mg via INTRAVENOUS
  Filled 2017-12-07: qty 1

## 2017-12-07 MED ORDER — LORAZEPAM 2 MG/ML IJ SOLN
1.0000 mg | INTRAMUSCULAR | Status: DC | PRN
  Filled 2017-12-07: qty 1

## 2017-12-07 MED ORDER — MORPHINE BOLUS VIA INFUSION
2.0000 mg | INTRAVENOUS | Status: DC | PRN
  Administered 2017-12-07 (×4): 2 mg via INTRAVENOUS
  Filled 2017-12-07: qty 4

## 2017-12-07 MED ORDER — MORPHINE BOLUS VIA INFUSION
2.0000 mg | INTRAVENOUS | Status: DC | PRN
  Filled 2017-12-07: qty 4

## 2017-12-07 MED ORDER — GLYCOPYRROLATE 0.2 MG/ML IJ SOLN
0.4000 mg | INTRAMUSCULAR | Status: DC
  Administered 2017-12-07 (×2): 0.4 mg via INTRAVENOUS
  Filled 2017-12-07 (×4): qty 2

## 2017-12-07 MED ORDER — MORPHINE BOLUS VIA INFUSION
1.0000 mg | INTRAVENOUS | Status: DC | PRN
  Filled 2017-12-07: qty 2

## 2017-12-07 DEATH — deceased

## 2017-12-08 NOTE — Progress Notes (Signed)
RN pronounced death with 2nd RN Estill Bamberg) present @ 2347. Wife and daughter at bedside. On-call provider, Baltazar Najjar paged and notified. RN contacted [employer]. Pt not a candidate for donation. Will be transferred to [facility] in [place], [place]

## 2017-12-08 NOTE — Discharge Summary (Signed)
Death Summary  [name] XLK:[id number] DOB: March 02, 1931 DOA: 26-Dec-2017  PCP: Ria Bush, MD   Admit date: 12/26/2017 Date of Death: 2018-01-01  Final Diagnoses:  Principal Problem:   Acute respiratory failure with hypoxia ([place]) Active Problems:   CKD (chronic kidney disease) stage 3, GFR 30-59 ml/min (HCC)   Essential tremor   HLD (hyperlipidemia)   GERD (gastroesophageal reflux disease)   Dementia   Macrocytic anemia   (HFpEF) heart failure with preserved ejection fraction (HCC)   Elevated factor VIII level   Goals of care, counseling/discussion   Long term (current) use of anticoagulants   Multifocal pneumonia   Elevated troponin   Acute respiratory failure ([place])   Palliative care by specialist   History of present illness:  [name] is an 84 y/o with pmh listed above who presented with complaints of chest pain  Hospital Course:  Acute respiratory failure with hypoxia - secondary to pna and STEMI - was made comfort care by palliative   Time: pronounced at 2017/12/31 Dec 28, 2345  Signed:  Velvet Bathe  [facility] 01-01-18, 1:44 PM

## 2017-12-08 NOTE — Progress Notes (Signed)
This RN along with Jerrye Bushy wasted 30cc of Morphine down the sink. Morphine gtt d/c'd

## 2017-12-10 NOTE — Telephone Encounter (Signed)
Pt passed away 4/1 evening. Spoke with wife today at office visit, expressed my condolences.

## 2017-12-29 ENCOUNTER — Ambulatory Visit: Payer: Medicare Other

## 2018-01-06 NOTE — Progress Notes (Signed)
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 Dx with pna on work up.  Pt's condition is deteriorating. Is currently on Bipap and patient becoming bipap dependent per my discussion with staff and palliative care.  Plan is to try and transition to hospice with bipap   Assessment & Plan:   Principal Problem:   Acute respiratory failure with hypoxia (HCC) - 2ary to pna - continue supportive therapy with supplemental oxygen and bipap. Currently trying to wean off bipap. - Palliative assisting with goals of care and symptom management.     Elevated troponin - per cardiology. No aggressive work up planned. Pt had deterioration in condition and plan is for comfort care. Palliative team consulted.  - increased morphine given reports of chest discomfort.  - complicated case when patient desaturates he has chest pain. Pt is not candidate for aggressive measures and plan currently is for comfort care.   Active Problems:   CKD (chronic kidney disease) stage 3, GFR 30-59 ml/min (HCC)   Essential tremor   HLD (hyperlipidemia)   GERD (gastroesophageal reflux disease)   Dementia   Macrocytic anemia   (HFpEF) heart failure with preserved ejection fraction (HCC)   Elevated factor VIII level   Long term (current) use of anticoagulants   CAP (community acquired pneumonia)   DVT prophylaxis: Warfarin Code Status: Partial: intubation but no chest compressions Family Communication: no family at bedside. Discussed with them yesterday Disposition Plan:  Residential hospice   Consultants:   Cardiology  Palliative    Procedures: none   Antimicrobials: cefepime   Subjective: Pt off bipap this am. No new complaints reported. Wife reports that at times patient is not all there as in  he is confused.   Objective: Vitals:   December 15, 2017 0318 15-Dec-2017 0447 12-15-2017 0758 12/15/2017 1130  BP:  (!) 98/33 (!) 99/53 (!) 100/55  Pulse:      Resp:  13 18 14   Temp:  98 F (36.7 C) 97.9 F (36.6 C)   TempSrc:  Axillary Oral Oral  SpO2: 96% 97% 98% 97%  Weight:      Height:        Intake/Output Summary (Last 24 hours) at 12-15-17 1243 Last data filed at 12-15-17 0842 Gross per 24 hour  Intake 247.38 ml  Output 700 ml  Net -452.62 ml   Filed Weights   11/06/2017 0709 11/13/2017 1800  Weight: 78 kg (172 lb) 78.8 kg (173 lb 11.6 oz)    Examination:  General exam: awake and alert, in nad.  Respiratory system: Pt on Amber, equal chest rise. No wheezes, rhales Cardiovascular system: S1 & S2 heard, no rubs.  Gastrointestinal system: Abdomen is nondistended, soft and nontender.  Central nervous system: Alert and oriented. No focal neurological deficits. Extremities: warm and dry. Skin: No rashes, lesions or ulcers, on limited exam. Psychiatry:  Mood & affect appropriate.    Data Reviewed: I have personally reviewed following labs and imaging studies  CBC: Recent Labs  Lab 11/19/2017 0801 11/09/2017 0807 12/03/17 0522  WBC 10.8*  --  8.4  HGB 10.8* 10.5* 10.1*  HCT 33.7* 31.0* 31.5*  MCV 119.9*  --  120.7*  PLT 138*  --  852*   Basic Metabolic Panel: Recent Labs  Lab 11/27/2017 0801 12/01/2017 0807 12/03/17 0522 12/03/17  1405  NA 140 142 139  --   K 4.8 4.8 5.3* 4.7  CL 107 107 108  --   CO2 23  --  23  --   GLUCOSE 142* 137* 190*  --   BUN 26* 26* 31*  --   CREATININE 1.60* 1.60* 1.94*  --   CALCIUM 9.2  --  8.6*  --    GFR: Estimated Creatinine Clearance: 28 mL/min (A) (by C-G formula based on SCr of 1.94 mg/dL (H)). Liver Function Tests: Recent Labs  Lab 12/03/17 0003  AST 47*  ALT 27  ALKPHOS 42  BILITOT 2.1*  PROT 5.6*  ALBUMIN 2.9*   No results for input(s): LIPASE, AMYLASE in the last 168 hours. No results for input(s): AMMONIA in the last 168  hours. Coagulation Profile: Recent Labs  Lab 12/03/17 0522 12/04/17 0242 12/05/17 0223 12/06/17 0422 12-09-2017 0200  INR 2.15 2.46 2.43 2.95 3.35   Cardiac Enzymes: Recent Labs  Lab 11/21/2017 1444 11/26/2017 1830 12/03/17 0003  TROPONINI 1.12* 1.37* 2.25*   BNP (last 3 results) No results for input(s): PROBNP in the last 8760 hours. HbA1C: No results for input(s): HGBA1C in the last 72 hours. CBG: No results for input(s): GLUCAP in the last 168 hours. Lipid Profile: No results for input(s): CHOL, HDL, LDLCALC, TRIG, CHOLHDL, LDLDIRECT in the last 72 hours. Thyroid Function Tests: No results for input(s): TSH, T4TOTAL, FREET4, T3FREE, THYROIDAB in the last 72 hours. Anemia Panel: No results for input(s): VITAMINB12, FOLATE, FERRITIN, TIBC, IRON, RETICCTPCT in the last 72 hours. Sepsis Labs: Recent Labs  Lab 11/23/2017 1444 12/01/2017 1830 12/03/17 0522 12/04/17 0242  PROCALCITON 0.58  --  0.78 0.40  LATICACIDVEN 1.7 1.8  --   --     Recent Results (from the past 240 hour(s))  Culture, blood (routine x 2) Call MD if unable to obtain prior to antibiotics being given     Status: None (Preliminary result)   Collection Time: 11/29/2017  2:44 PM  Result Value Ref Range Status   Specimen Description BLOOD LEFT HAND  Final   Special Requests   Final    BOTTLES DRAWN AEROBIC ONLY Blood Culture results may not be optimal due to an inadequate volume of blood received in culture bottles   Culture   Final    NO GROWTH 4 DAYS Performed at [facility], [place] [address] [place], [place] [address]    Report Status PENDING  Incomplete  Culture, blood (routine x 2) Call MD if unable to obtain prior to antibiotics being given     Status: None (Preliminary result)   Collection Time: 11/08/2017  3:09 PM  Result Value Ref Range Status   Specimen Description BLOOD RIGHT HAND  Final   Special Requests   Final    BOTTLES DRAWN AEROBIC AND ANAEROBIC Blood Culture adequate volume   Culture    Final    NO GROWTH 4 DAYS Performed at [facility], [place] [address] [place], [place] [address]    Report Status PENDING  Incomplete  MRSA PCR Screening     Status: None   Collection Time: 12/04/17 10:51 PM  Result Value Ref Range Status   MRSA by PCR NEGATIVE NEGATIVE Final    Comment:        The GeneXpert MRSA Assay (FDA approved for NASAL specimens only), is one component of a comprehensive MRSA colonization surveillance program. It is not intended to diagnose MRSA infection nor to guide or  monitor treatment for MRSA infections. Performed at [facility], [place] [address] [place], [place] [address]          Radiology Studies: Dg Chest Port 1 View  Result Date: 12/06/2017 CLINICAL DATA:  Dyspnea EXAM: PORTABLE CHEST 1 VIEW COMPARISON:  12/05/2017 FINDINGS: Chronic cardiopericardial enlargement. Diffuse interstitial and airspace opacity. Small pleural effusions which may have increased or layered since prior. No pneumothorax. IMPRESSION: Pneumonia and/or edema. Small layering pleural effusions that have increased from 4 days prior. Electronically Signed   By: Monte Fantasia M.D.   On: 12/06/2017 12:24        Scheduled Meds: . aspirin EC  81 mg Oral Daily  . glycopyrrolate  0.2 mg Intravenous Q8H  . mouth rinse  15 mL Mouth Rinse BID  . pantoprazole  40 mg Oral Daily  . sodium chloride flush  3 mL Intravenous Q12H  . Warfarin - Pharmacist Dosing Inpatient   Does not apply QHS   Continuous Infusions: . sodium chloride    . ceFEPime (MAXIPIME) IV Stopped (12-22-2017 1046)  . morphine 0.5 mg/hr (12/06/17 1314)     LOS: 5 days    Time spent: 15 minutes   Velvet Bathe, MD [facility] Pager [phone]  If 7PM-7AM, please contact night-coverage [email or web address] Password [facility] 12-22-17, 12:43 PM

## 2018-01-06 NOTE — Progress Notes (Signed)
Patient not doing well  Visited and met family and had prayer for family and patient to have peace and comfort.  Family very appreciative. Conard Novak, Chaplain   Dec 09, 2017 1300  Clinical Encounter Type  Visited With Patient and family together  Visit Type Initial;Spiritual support;Patient actively dying  Referral From Nurse  Consult/Referral To Chaplain  Spiritual Encounters  Spiritual Needs Prayer;Emotional  Stress Factors  Patient Stress Factors None identified  Family Stress Factors Health changes

## 2018-01-06 NOTE — Progress Notes (Signed)
ANTICOAGULATION CONSULT NOTE - Follow-Up Consult  Pharmacy Consult for Warfarin Indication: hx PE/DVT  Patient Measurements: Height: 5\' 6"  (167.6 cm) Weight: 173 lb 11.6 oz (78.8 kg) IBW/kg (Calculated) : 63.8  Vital Signs: Temp: 97.9 F (36.6 C) (04/01 0758) Temp Source: Oral (04/01 0758) BP: 99/53 (04/01 0758)  Labs: Recent Labs    12/05/17 0223 12/06/17 0422 12-31-17 0200  LABPROT 26.2* 30.5* 33.7*  INR 2.43 2.95 3.35    Estimated Creatinine Clearance: 28 mL/min (A) (by C-G formula based on SCr of 1.94 mg/dL (H)).   Assessment: 27 YOM with on warfarin 2.5mg  PO daily resumed from PTA for hx DVT/PE. Admit INR therapeutic at 2.11.  Recently on flagyl, now d/c, Also with poor po intake. INR up to 3.35 today after holding dose today. Hgb 10.1, plts 145.  Goal of Therapy:  INR 2-3 Monitor platelets by anticoagulation protocol: Yes   Plan:  Hold Coumadin again tonight Monitor daily INR, CBC, s/s of bleed  Elenor Quinones, PharmD, [facility] Clinical Pharmacist Pager [phone] December 31, 2017 9:51 AM

## 2018-01-06 NOTE — Progress Notes (Signed)
Paged Palliative. Pt states his is not comfortable and seems in pain.  Pt also states he is ready to die and to "send him on down the road".    Palliative increased morphine drip range and dose of morphine bolus.  Family is in agreement with this decision. Palliative will visit when available.

## 2018-01-06 NOTE — Care Management Important Message (Signed)
Important Message  Patient Details  Name: [name] MRN: [id number] Date of Birth: [birth year]   Medicare Important Message Given:  Yes    Erenest Rasher, RN 01-01-2018, 12:43 PM

## 2018-01-06 NOTE — Progress Notes (Signed)
CSW informed of family preference for transfer to [facility]- referral made to hospice facility for review- they currently have waitlist but will screen patient for appropriateness.  Jorge Ny, LCSW Clinical Social Worker [phone]

## 2018-01-06 NOTE — Care Management Important Message (Signed)
Important Message  Patient Details  Name: [name] MRN: [id number] Date of Birth: [birth year]   Medicare Important Message Given:  Yes    Orbie Pyo 01/06/2018, 2:56 PM

## 2018-01-06 NOTE — Progress Notes (Signed)
[facility] Liaison: RN visit  Received request from [place], [place] for family interest in [facility]. Chart reviewed and spoke with Belenda Cruise, wife and family to acknowledge referral. Unfortunately [facility] is not able to offer a room today. Family and CSW are aware [facility] hospital liaison will follow up with CSW and family tomorrow if room becomes available.  Please do not hesitate to call with questions.  Thank you for this referral.  Farrel Gordon, [facility] Liaison  [place] are on AMION

## 2018-01-06 NOTE — Consult Note (Signed)
[facility] CM Primary Care Navigator  12/28/2017  [name] May 03, 1931 [id number]  Went to see patient at the bedside to identify possible discharge needs.  Per chart review, family prefers that patient will be transferred to residential hospice at [facility]  Per MD note, patient's condition is deteriorating. He is currently on Bipap (patient is becoming BiPAP dependent) with plan to try and transition patient to Hospice with BiPAP.   Per Palliative consult, patient is nearing end of life with likely prognosis of days to weeks even on BiPAP.  At end of life care, nofurther health management neededat this point.   For additional questions please contact:  Karin Golden A. Nikolaus Pienta, BSN, RN-BC [facility] PRIMARY CARE Navigator Cell: [phone]

## 2018-01-06 NOTE — Progress Notes (Signed)
Palliative:  I have met at [name] bedside with wife and daughter. They are very pleased that he is tolerating off BiPAP well. He is doing well on morphine infusion. They are still struggling with fully letting go of BiPAP but goals are for comfort as they recognize that prognosis is very poor. They understand that CXR yesterday was worse and he is very congested and this is audible even without stethoscope. He coughs without being able to expectorate much (wife says when he does it is blood). They are interested in pursuing hospice placement at this time and prefer [facility] facility. They understand that the care in hospice will be different and that the goal is to titrate away from BiPAP and utilize medications for comfort care. Wife seems more comfortable with a full comfort care plan of care at a hospice facility and understands that labs, BiPAP, and medications will change while being at hospice and is okay with d/c antibiotics if he gets a bed at hospice. She wishes to continue these measures while he is hospitalized.   Spent much time discussing the role and goal of BiPAP use. He has been requesting BiPAP previously but last time BiPAP was placed d/t desaturation and not for symptoms. Discussed utilizing morphine instead of BiPAP for symptom management and that we focus on looking more at him than his numbers. This is a struggle her with nursing staff as he is not "full comfort care" and even wife says she was told that the nurses had to replace the BiPAP. I do not think she is fully ready to let go of the BiPAP at this time but is progressing to this stage slowly.   I did speak with [name] who is visibly fatigued and mild-mod SOB but trying to speak with me. He shares that he is related to 6 past presidents, [place], and [place] was his great, great, great grandmother (I forget how many greats this would be)! He is very proud of his heritage.    All questions/concerns  addressed. Emotional support provided.   72 min  Vinie Sill, NP Palliative Medicine Team Pager # [phone] (M-F 8a-5p) Team Phone # [phone] (Nights/Weekends)

## 2018-01-06 NOTE — Progress Notes (Signed)
Palliative:  I was called back to visit with [name] family. His wife and 2 daughters are at bedside and are tearful. His secretions are worsening and he appears more uncomfortable. They have had conversations and are prepared for comfort care at this time. Wife also commented that she would prefer he be in a more restful sleep state than awake and miserable. He has shared with his family that he is "ready" per Therapist, sports.   Family was hopeful to transition to hospice but no beds available. Very important that they have a change of scenery and we discussed transition to 6N bed for comfort care.   Vinie Sill, NP Palliative Medicine Team Pager # [phone] (M-F 8a-5p) Team Phone # [phone] (Nights/Weekends)

## 2018-01-06 NOTE — Progress Notes (Signed)
Nutrition Brief Note  Pt identified on the </= 12 Braden score report. Pt's discharge plan is for Residential Hospice. No nutrition interventions warranted at this time.  Please consult as needed.   Arthur Holms, RD, LDN Pager #: [phone] After-Hours Pager #: [phone]

## 2018-01-06 NOTE — Progress Notes (Signed)
Spoke with Dr. Hilma Favors on phone regarding pt.  Informed her that pt RR is 8-11/min and felt that pt should not be moved as this would possibly result in passing in transit which felt would be hard for staff and traumatic for family which she agreed with.  Informed that wife was asking about removing O2 and had told her this was for comfort but MD states it can help the passing and to go ahead and remove.  Wife is okay with this plan as well.  Will give Morphine boluses to help pt remain comfortable.  Went to room with family and removed bipap machine and took pt off cardiac monitoring.  Have moved IV pump to facilitate boluses and not have to move family around room as often.  Removed O2 at this time. Got extra recliner per family request.  Instructed to call if needs anything.  Family very appreciative of care.

## 2018-01-06 NOTE — Care Management Note (Signed)
Case Management Note  Patient Details  Name: [name] MRN: [id number] Date of Birth: 06-24-1931  Subjective/Objective:   PNA, acute resp failure with hypoxia, CHF                 Action/Plan: NCM spoke to pt's wife and plan is for Residential Hospice. CSW following for Residential Hospice Placement.   Expected Discharge Date:                Expected Discharge Plan:  [place]  In-House Referral:  Clinical Social Work  Discharge planning Services  CM Consult  Post Acute Care Choice:  NA Choice offered to:  NA  DME Arranged:  N/A DME Agency:  NA  HH Arranged:  NA HH Agency:  NA  Status of Service:  Completed, signed off  If discussed at [place] of Stay Meetings, dates discussed:    Additional Comments:  Erenest Rasher, RN 12-30-2017, 12:44 PM

## 2018-01-06 NOTE — Progress Notes (Signed)
Pharmacy Antibiotic Note  [name] is a 84 y.o. male admitted on 11/15/2017 with pneumonia.  Pharmacy has been consulted for cefepime dosing.  Day #6 of abx for multifocal pneumonia and concern for aspiration. Avoiding PCN d/t rash. Afeb, WBC wnl  Plan: Continue cefepime 1g IV Q24h Monitor clinical picture, renal function F/U LOT  Consider stopping cefepime tomorrow after 7 days of treatment?   Height: 5\' 6"  (167.6 cm) Weight: 173 lb 11.6 oz (78.8 kg) IBW/kg (Calculated) : 63.8  Temp (24hrs), Avg:98.5 F (36.9 C), Min:97.9 F (36.6 C), Max:99.5 F (37.5 C)  Recent Labs  Lab 11/09/2017 0801 11/29/2017 0807 11/29/2017 1444 11/29/2017 1830 12/03/17 0522  WBC 10.8*  --   --   --  8.4  CREATININE 1.60* 1.60*  --   --  1.94*  LATICACIDVEN  --   --  1.7 1.8  --     Estimated Creatinine Clearance: 28 mL/min (A) (by C-G formula based on SCr of 1.94 mg/dL (H)).    Allergies  Allergen Reactions  . Lactose Intolerance (Gi) Diarrhea  . Sulfa Antibiotics Hives  . Sulfadiazine Hives  . Aricept [Donepezil Hcl] Rash  . Lipitor [Atorvastatin] Rash  . Nizatidine Rash  . Penicillins Rash    Has patient had a PCN reaction causing immediate rash, facial/tongue/throat swelling, SOB or lightheadedness with hypotension: Yes Has patient had a PCN reaction causing severe rash involving mucus membranes or skin necrosis: No Has patient had a PCN reaction that required hospitalization: No Has patient had a PCN reaction occurring within the last 10 years: No If all of the above answers are "NO", then may proceed with Cephalosporin use.     Thank you for allowing pharmacy to be a part of this patient's care.  Reginia Naas 2018-01-01 9:55 AM

## 2018-01-06 DEATH — deceased

## 2018-03-09 ENCOUNTER — Ambulatory Visit: Payer: Medicare Other

## 2018-03-12 ENCOUNTER — Encounter: Payer: Medicare Other | Admitting: Family Medicine
